# Patient Record
Sex: Female | Born: 1945 | Race: Black or African American | Hispanic: No | Marital: Married | Smoking: Never smoker
Health system: Southern US, Community
[De-identification: ages and names within clinical notes are randomized; demographics above are authoritative.]

## PROBLEM LIST (undated history)

## (undated) DIAGNOSIS — I38 Endocarditis, valve unspecified: Secondary | ICD-10-CM

## (undated) DIAGNOSIS — K439 Ventral hernia without obstruction or gangrene: Secondary | ICD-10-CM

## (undated) DIAGNOSIS — R0600 Dyspnea, unspecified: Secondary | ICD-10-CM

## (undated) DIAGNOSIS — K3184 Gastroparesis: Secondary | ICD-10-CM

## (undated) DIAGNOSIS — C50919 Malignant neoplasm of unspecified site of unspecified female breast: Secondary | ICD-10-CM

## (undated) DIAGNOSIS — Z9221 Personal history of antineoplastic chemotherapy: Secondary | ICD-10-CM

## (undated) DIAGNOSIS — I10 Essential (primary) hypertension: Secondary | ICD-10-CM

## (undated) DIAGNOSIS — R911 Solitary pulmonary nodule: Secondary | ICD-10-CM

## (undated) DIAGNOSIS — I700 Atherosclerosis of aorta: Secondary | ICD-10-CM

## (undated) DIAGNOSIS — Z8711 Personal history of peptic ulcer disease: Secondary | ICD-10-CM

## (undated) DIAGNOSIS — Z87442 Personal history of urinary calculi: Secondary | ICD-10-CM

## (undated) DIAGNOSIS — E785 Hyperlipidemia, unspecified: Secondary | ICD-10-CM

## (undated) DIAGNOSIS — R0602 Shortness of breath: Secondary | ICD-10-CM

## (undated) DIAGNOSIS — I2510 Atherosclerotic heart disease of native coronary artery without angina pectoris: Secondary | ICD-10-CM

## (undated) DIAGNOSIS — C801 Malignant (primary) neoplasm, unspecified: Secondary | ICD-10-CM

## (undated) DIAGNOSIS — K219 Gastro-esophageal reflux disease without esophagitis: Secondary | ICD-10-CM

## (undated) DIAGNOSIS — Z923 Personal history of irradiation: Secondary | ICD-10-CM

## (undated) DIAGNOSIS — I499 Cardiac arrhythmia, unspecified: Secondary | ICD-10-CM

## (undated) DIAGNOSIS — R0609 Other forms of dyspnea: Secondary | ICD-10-CM

## (undated) DIAGNOSIS — I5030 Unspecified diastolic (congestive) heart failure: Secondary | ICD-10-CM

## (undated) DIAGNOSIS — T4145XA Adverse effect of unspecified anesthetic, initial encounter: Secondary | ICD-10-CM

## (undated) DIAGNOSIS — T8859XA Other complications of anesthesia, initial encounter: Secondary | ICD-10-CM

## (undated) DIAGNOSIS — G4700 Insomnia, unspecified: Secondary | ICD-10-CM

## (undated) DIAGNOSIS — F419 Anxiety disorder, unspecified: Secondary | ICD-10-CM

## (undated) DIAGNOSIS — M47816 Spondylosis without myelopathy or radiculopathy, lumbar region: Secondary | ICD-10-CM

## (undated) DIAGNOSIS — Z87440 Personal history of urinary (tract) infections: Secondary | ICD-10-CM

## (undated) DIAGNOSIS — D649 Anemia, unspecified: Secondary | ICD-10-CM

## (undated) DIAGNOSIS — K648 Other hemorrhoids: Secondary | ICD-10-CM

## (undated) DIAGNOSIS — M51369 Other intervertebral disc degeneration, lumbar region without mention of lumbar back pain or lower extremity pain: Secondary | ICD-10-CM

## (undated) DIAGNOSIS — N281 Cyst of kidney, acquired: Secondary | ICD-10-CM

## (undated) DIAGNOSIS — K469 Unspecified abdominal hernia without obstruction or gangrene: Secondary | ICD-10-CM

## (undated) DIAGNOSIS — M1990 Unspecified osteoarthritis, unspecified site: Secondary | ICD-10-CM

## (undated) DIAGNOSIS — Z9889 Other specified postprocedural states: Secondary | ICD-10-CM

## (undated) DIAGNOSIS — K5790 Diverticulosis of intestine, part unspecified, without perforation or abscess without bleeding: Secondary | ICD-10-CM

## (undated) DIAGNOSIS — K760 Fatty (change of) liver, not elsewhere classified: Secondary | ICD-10-CM

## (undated) DIAGNOSIS — K589 Irritable bowel syndrome without diarrhea: Secondary | ICD-10-CM

## (undated) DIAGNOSIS — M5136 Other intervertebral disc degeneration, lumbar region: Secondary | ICD-10-CM

## (undated) DIAGNOSIS — R011 Cardiac murmur, unspecified: Secondary | ICD-10-CM

## (undated) DIAGNOSIS — E119 Type 2 diabetes mellitus without complications: Secondary | ICD-10-CM

## (undated) DIAGNOSIS — Q2112 Patent foramen ovale: Secondary | ICD-10-CM

## (undated) DIAGNOSIS — T7840XA Allergy, unspecified, initial encounter: Secondary | ICD-10-CM

## (undated) DIAGNOSIS — E669 Obesity, unspecified: Secondary | ICD-10-CM

## (undated) DIAGNOSIS — Z5189 Encounter for other specified aftercare: Secondary | ICD-10-CM

## (undated) DIAGNOSIS — N200 Calculus of kidney: Secondary | ICD-10-CM

## (undated) DIAGNOSIS — Z8719 Personal history of other diseases of the digestive system: Secondary | ICD-10-CM

## (undated) DIAGNOSIS — R112 Nausea with vomiting, unspecified: Secondary | ICD-10-CM

## (undated) HISTORY — DX: Malignant (primary) neoplasm, unspecified: C80.1

## (undated) HISTORY — PX: CATARACT EXTRACTION W/ INTRAOCULAR LENS  IMPLANT, BILATERAL: SHX1307

## (undated) HISTORY — DX: Type 2 diabetes mellitus without complications: E11.9

## (undated) HISTORY — DX: Insomnia, unspecified: G47.00

## (undated) HISTORY — DX: Atherosclerosis of aorta: I70.0

## (undated) HISTORY — DX: Obesity, unspecified: E66.9

## (undated) HISTORY — DX: Endocarditis, valve unspecified: I38

## (undated) HISTORY — DX: Allergy, unspecified, initial encounter: T78.40XA

## (undated) HISTORY — PX: BACK SURGERY: SHX140

## (undated) HISTORY — DX: Diverticulosis of intestine, part unspecified, without perforation or abscess without bleeding: K57.90

## (undated) HISTORY — PX: BREAST BIOPSY: SHX20

## (undated) HISTORY — DX: Gastro-esophageal reflux disease without esophagitis: K21.9

## (undated) HISTORY — DX: Solitary pulmonary nodule: R91.1

## (undated) HISTORY — DX: Spondylosis without myelopathy or radiculopathy, lumbar region: M47.816

## (undated) HISTORY — DX: Unspecified osteoarthritis, unspecified site: M19.90

## (undated) HISTORY — DX: Other hemorrhoids: K64.8

## (undated) HISTORY — DX: Patent foramen ovale: Q21.12

## (undated) HISTORY — DX: Malignant neoplasm of unspecified site of unspecified female breast: C50.919

## (undated) HISTORY — DX: Essential (primary) hypertension: I10

## (undated) HISTORY — DX: Atherosclerotic heart disease of native coronary artery without angina pectoris: I25.10

## (undated) HISTORY — DX: Other intervertebral disc degeneration, lumbar region without mention of lumbar back pain or lower extremity pain: M51.369

## (undated) HISTORY — DX: Irritable bowel syndrome, unspecified: K58.9

## (undated) HISTORY — DX: Hyperlipidemia, unspecified: E78.5

## (undated) HISTORY — PX: APPENDECTOMY: SHX54

## (undated) HISTORY — PX: LITHOTRIPSY: SUR834

## (undated) HISTORY — DX: Fatty (change of) liver, not elsewhere classified: K76.0

## (undated) HISTORY — DX: Ventral hernia without obstruction or gangrene: K43.9

## (undated) HISTORY — PX: OTHER SURGICAL HISTORY: SHX169

## (undated) HISTORY — DX: Gastroparesis: K31.84

## (undated) HISTORY — DX: Unspecified diastolic (congestive) heart failure: I50.30

## (undated) HISTORY — PX: EYE SURGERY: SHX253

## (undated) HISTORY — PX: BREAST SURGERY: SHX581

## (undated) HISTORY — PX: OVARIAN CYST SURGERY: SHX726

## (undated) HISTORY — DX: Other intervertebral disc degeneration, lumbar region: M51.36

---

## 1979-05-25 DIAGNOSIS — Z5189 Encounter for other specified aftercare: Secondary | ICD-10-CM

## 1979-05-25 DIAGNOSIS — IMO0001 Reserved for inherently not codable concepts without codable children: Secondary | ICD-10-CM

## 1979-05-25 HISTORY — DX: Encounter for other specified aftercare: Z51.89

## 1979-05-25 HISTORY — DX: Reserved for inherently not codable concepts without codable children: IMO0001

## 1987-09-24 DIAGNOSIS — E119 Type 2 diabetes mellitus without complications: Secondary | ICD-10-CM

## 1987-09-24 HISTORY — DX: Type 2 diabetes mellitus without complications: E11.9

## 1995-09-24 DIAGNOSIS — C50919 Malignant neoplasm of unspecified site of unspecified female breast: Secondary | ICD-10-CM

## 1995-09-24 HISTORY — PX: MASTECTOMY: SHX3

## 1995-09-24 HISTORY — DX: Malignant neoplasm of unspecified site of unspecified female breast: C50.919

## 1999-10-29 ENCOUNTER — Encounter: Admission: RE | Admit: 1999-10-29 | Discharge: 1999-12-07 | Payer: Self-pay | Admitting: Oncology

## 2000-09-23 HISTORY — PX: ABDOMINAL HYSTERECTOMY: SHX81

## 2001-01-23 ENCOUNTER — Ambulatory Visit (HOSPITAL_COMMUNITY): Admission: RE | Admit: 2001-01-23 | Discharge: 2001-01-23 | Payer: Self-pay | Admitting: General Surgery

## 2001-04-03 ENCOUNTER — Encounter: Admission: RE | Admit: 2001-04-03 | Discharge: 2001-04-03 | Payer: Self-pay | Admitting: Oncology

## 2001-04-03 ENCOUNTER — Encounter (HOSPITAL_COMMUNITY): Admission: RE | Admit: 2001-04-03 | Discharge: 2001-04-22 | Payer: Self-pay | Admitting: Oncology

## 2001-05-15 ENCOUNTER — Ambulatory Visit (HOSPITAL_COMMUNITY): Admission: RE | Admit: 2001-05-15 | Discharge: 2001-05-15 | Payer: Self-pay | Admitting: Cardiology

## 2001-07-13 ENCOUNTER — Encounter: Admission: RE | Admit: 2001-07-13 | Discharge: 2001-10-11 | Payer: Self-pay | Admitting: Internal Medicine

## 2002-01-11 ENCOUNTER — Encounter: Payer: Self-pay | Admitting: General Surgery

## 2002-01-11 ENCOUNTER — Ambulatory Visit (HOSPITAL_COMMUNITY): Admission: RE | Admit: 2002-01-11 | Discharge: 2002-01-11 | Payer: Self-pay | Admitting: General Surgery

## 2002-05-07 ENCOUNTER — Encounter: Admission: RE | Admit: 2002-05-07 | Discharge: 2002-05-07 | Payer: Self-pay | Admitting: Oncology

## 2002-05-07 ENCOUNTER — Encounter (HOSPITAL_COMMUNITY): Admission: RE | Admit: 2002-05-07 | Discharge: 2002-06-06 | Payer: Self-pay | Admitting: Oncology

## 2002-06-15 ENCOUNTER — Encounter: Admission: RE | Admit: 2002-06-15 | Discharge: 2002-09-13 | Payer: Self-pay | Admitting: Internal Medicine

## 2003-01-28 ENCOUNTER — Encounter: Payer: Self-pay | Admitting: General Surgery

## 2003-01-28 ENCOUNTER — Ambulatory Visit (HOSPITAL_COMMUNITY): Admission: RE | Admit: 2003-01-28 | Discharge: 2003-01-28 | Payer: Self-pay | Admitting: General Surgery

## 2003-05-20 ENCOUNTER — Encounter (HOSPITAL_COMMUNITY): Admission: RE | Admit: 2003-05-20 | Discharge: 2003-06-19 | Payer: Self-pay | Admitting: General Surgery

## 2003-05-20 ENCOUNTER — Encounter: Payer: Self-pay | Admitting: General Surgery

## 2003-05-24 ENCOUNTER — Encounter: Admission: RE | Admit: 2003-05-24 | Discharge: 2003-05-24 | Payer: Self-pay | Admitting: Oncology

## 2003-05-24 ENCOUNTER — Encounter (HOSPITAL_COMMUNITY): Admission: RE | Admit: 2003-05-24 | Discharge: 2003-06-23 | Payer: Self-pay | Admitting: Oncology

## 2003-05-27 ENCOUNTER — Encounter: Payer: Self-pay | Admitting: General Surgery

## 2003-06-10 ENCOUNTER — Ambulatory Visit (HOSPITAL_COMMUNITY): Admission: RE | Admit: 2003-06-10 | Discharge: 2003-06-10 | Payer: Self-pay | Admitting: General Surgery

## 2003-06-10 ENCOUNTER — Encounter: Payer: Self-pay | Admitting: General Surgery

## 2003-06-22 ENCOUNTER — Ambulatory Visit (HOSPITAL_COMMUNITY): Admission: RE | Admit: 2003-06-22 | Discharge: 2003-06-22 | Payer: Self-pay | Admitting: General Surgery

## 2003-07-07 ENCOUNTER — Emergency Department (HOSPITAL_COMMUNITY): Admission: EM | Admit: 2003-07-07 | Discharge: 2003-07-07 | Payer: Self-pay | Admitting: Emergency Medicine

## 2003-07-07 ENCOUNTER — Encounter: Payer: Self-pay | Admitting: Emergency Medicine

## 2003-07-11 ENCOUNTER — Encounter: Payer: Self-pay | Admitting: Urology

## 2003-07-11 ENCOUNTER — Ambulatory Visit (HOSPITAL_BASED_OUTPATIENT_CLINIC_OR_DEPARTMENT_OTHER): Admission: RE | Admit: 2003-07-11 | Discharge: 2003-07-11 | Payer: Self-pay | Admitting: Urology

## 2003-07-14 ENCOUNTER — Ambulatory Visit (HOSPITAL_COMMUNITY): Admission: RE | Admit: 2003-07-14 | Discharge: 2003-07-14 | Payer: Self-pay | Admitting: Urology

## 2004-04-10 ENCOUNTER — Encounter (HOSPITAL_COMMUNITY): Admission: RE | Admit: 2004-04-10 | Discharge: 2004-05-10 | Payer: Self-pay | Admitting: Oncology

## 2004-04-10 ENCOUNTER — Encounter: Admission: RE | Admit: 2004-04-10 | Discharge: 2004-04-10 | Payer: Self-pay | Admitting: Oncology

## 2004-05-23 ENCOUNTER — Encounter: Admission: RE | Admit: 2004-05-23 | Discharge: 2004-06-22 | Payer: Self-pay | Admitting: Oncology

## 2004-05-23 ENCOUNTER — Encounter (HOSPITAL_COMMUNITY): Admission: RE | Admit: 2004-05-23 | Discharge: 2004-06-22 | Payer: Self-pay | Admitting: Oncology

## 2005-01-18 ENCOUNTER — Encounter: Admission: RE | Admit: 2005-01-18 | Discharge: 2005-01-18 | Payer: Self-pay | Admitting: Gastroenterology

## 2005-03-08 ENCOUNTER — Encounter (INDEPENDENT_AMBULATORY_CARE_PROVIDER_SITE_OTHER): Payer: Self-pay | Admitting: Specialist

## 2005-03-08 ENCOUNTER — Ambulatory Visit (HOSPITAL_COMMUNITY): Admission: RE | Admit: 2005-03-08 | Discharge: 2005-03-08 | Payer: Self-pay | Admitting: Gastroenterology

## 2005-05-28 ENCOUNTER — Encounter (HOSPITAL_COMMUNITY): Admission: RE | Admit: 2005-05-28 | Discharge: 2005-06-21 | Payer: Self-pay | Admitting: Oncology

## 2005-05-28 ENCOUNTER — Encounter: Admission: RE | Admit: 2005-05-28 | Discharge: 2005-06-21 | Payer: Self-pay | Admitting: Oncology

## 2005-06-05 ENCOUNTER — Ambulatory Visit (HOSPITAL_COMMUNITY): Payer: Self-pay | Admitting: Oncology

## 2006-06-02 ENCOUNTER — Encounter (HOSPITAL_COMMUNITY): Admission: RE | Admit: 2006-06-02 | Discharge: 2006-06-20 | Payer: Self-pay | Admitting: Oncology

## 2006-06-02 ENCOUNTER — Encounter: Admission: RE | Admit: 2006-06-02 | Discharge: 2006-06-20 | Payer: Self-pay | Admitting: Oncology

## 2006-06-04 ENCOUNTER — Ambulatory Visit (HOSPITAL_COMMUNITY): Payer: Self-pay | Admitting: Oncology

## 2006-07-02 ENCOUNTER — Ambulatory Visit: Payer: Self-pay | Admitting: Family Medicine

## 2006-08-05 ENCOUNTER — Other Ambulatory Visit: Admission: RE | Admit: 2006-08-05 | Discharge: 2006-08-05 | Payer: Self-pay | Admitting: Family Medicine

## 2006-08-05 ENCOUNTER — Ambulatory Visit: Payer: Self-pay | Admitting: Family Medicine

## 2006-08-05 ENCOUNTER — Encounter (INDEPENDENT_AMBULATORY_CARE_PROVIDER_SITE_OTHER): Payer: Self-pay | Admitting: *Deleted

## 2006-08-05 ENCOUNTER — Encounter: Payer: Self-pay | Admitting: Family Medicine

## 2006-10-23 ENCOUNTER — Ambulatory Visit: Payer: Self-pay | Admitting: Family Medicine

## 2006-11-07 ENCOUNTER — Ambulatory Visit (HOSPITAL_COMMUNITY): Admission: RE | Admit: 2006-11-07 | Discharge: 2006-11-07 | Payer: Self-pay | Admitting: Family Medicine

## 2006-11-07 ENCOUNTER — Encounter: Payer: Self-pay | Admitting: Family Medicine

## 2006-11-07 LAB — CONVERTED CEMR LAB
Albumin: 4.2 g/dL (ref 3.5–5.2)
HDL: 64 mg/dL (ref >39)
Hgb A1c MFr Bld: 7.1 % — ABNORMAL HIGH (ref 4.6–6.1)
LDL Cholesterol: 104 mg/dL — ABNORMAL HIGH (ref 0–99)
Triglycerides: 95 mg/dL (ref <150)
VLDL: 19 mg/dL (ref 0–40)

## 2006-11-19 ENCOUNTER — Ambulatory Visit: Payer: Self-pay | Admitting: Family Medicine

## 2006-12-19 ENCOUNTER — Emergency Department (HOSPITAL_COMMUNITY): Admission: EM | Admit: 2006-12-19 | Discharge: 2006-12-19 | Payer: Self-pay | Admitting: Emergency Medicine

## 2006-12-19 ENCOUNTER — Ambulatory Visit: Payer: Self-pay | Admitting: Family Medicine

## 2006-12-24 ENCOUNTER — Ambulatory Visit: Payer: Self-pay | Admitting: Family Medicine

## 2007-03-18 ENCOUNTER — Ambulatory Visit: Payer: Self-pay | Admitting: Family Medicine

## 2007-03-19 ENCOUNTER — Encounter: Payer: Self-pay | Admitting: Family Medicine

## 2007-03-19 LAB — CONVERTED CEMR LAB
ALT: 22 units/L (ref 0–35)
Bilirubin, Direct: 0.1 mg/dL (ref 0.0–0.3)
CO2: 22 meq/L (ref 19–32)
Chloride: 105 meq/L (ref 96–112)
HDL: 56 mg/dL (ref >39)
LDL Cholesterol: 131 mg/dL — ABNORMAL HIGH (ref 0–99)
Potassium: 3.9 meq/L (ref 3.5–5.3)
Sodium: 140 meq/L (ref 135–145)
Total Bilirubin: 0.5 mg/dL (ref 0.3–1.2)
Total CHOL/HDL Ratio: 3.8
VLDL: 28 mg/dL (ref 0–40)

## 2007-03-20 ENCOUNTER — Ambulatory Visit (HOSPITAL_COMMUNITY): Admission: RE | Admit: 2007-03-20 | Discharge: 2007-03-20 | Payer: Self-pay | Admitting: Family Medicine

## 2007-09-24 ENCOUNTER — Encounter: Payer: Self-pay | Admitting: Family Medicine

## 2007-09-24 DIAGNOSIS — N200 Calculus of kidney: Secondary | ICD-10-CM

## 2007-09-24 HISTORY — DX: Calculus of kidney: N20.0

## 2007-10-09 ENCOUNTER — Encounter (HOSPITAL_COMMUNITY): Admission: RE | Admit: 2007-10-09 | Discharge: 2007-11-08 | Payer: Self-pay | Admitting: Oncology

## 2007-10-09 ENCOUNTER — Ambulatory Visit (HOSPITAL_COMMUNITY): Payer: Self-pay | Admitting: Oncology

## 2007-11-09 ENCOUNTER — Ambulatory Visit: Payer: Self-pay | Admitting: Family Medicine

## 2007-11-09 LAB — CONVERTED CEMR LAB: Microalb, Ur: 4.52 mg/dL — ABNORMAL HIGH (ref 0.00–1.89)

## 2007-12-15 ENCOUNTER — Encounter: Payer: Self-pay | Admitting: Family Medicine

## 2007-12-15 ENCOUNTER — Ambulatory Visit (HOSPITAL_COMMUNITY): Admission: RE | Admit: 2007-12-15 | Discharge: 2007-12-15 | Payer: Self-pay | Admitting: Family Medicine

## 2007-12-15 LAB — CONVERTED CEMR LAB
ALT: 21 units/L (ref 0–35)
BUN: 8 mg/dL (ref 6–23)
Bilirubin, Direct: 0.1 mg/dL (ref 0.0–0.3)
Calcium: 9.7 mg/dL (ref 8.4–10.5)
Cholesterol: 212 mg/dL — ABNORMAL HIGH (ref 0–200)
Glucose, Bld: 151 mg/dL — ABNORMAL HIGH (ref 70–99)
Indirect Bilirubin: 0.3 mg/dL (ref 0.0–0.9)
Potassium: 3.7 meq/L (ref 3.5–5.3)
VLDL: 20 mg/dL (ref 0–40)

## 2008-01-04 ENCOUNTER — Ambulatory Visit: Payer: Self-pay | Admitting: Family Medicine

## 2008-02-02 ENCOUNTER — Ambulatory Visit: Payer: Self-pay | Admitting: Family Medicine

## 2008-02-09 ENCOUNTER — Encounter (INDEPENDENT_AMBULATORY_CARE_PROVIDER_SITE_OTHER): Payer: Self-pay | Admitting: *Deleted

## 2008-02-09 DIAGNOSIS — I10 Essential (primary) hypertension: Secondary | ICD-10-CM | POA: Insufficient documentation

## 2008-02-09 DIAGNOSIS — E669 Obesity, unspecified: Secondary | ICD-10-CM

## 2008-02-09 DIAGNOSIS — G4700 Insomnia, unspecified: Secondary | ICD-10-CM

## 2008-02-09 DIAGNOSIS — E785 Hyperlipidemia, unspecified: Secondary | ICD-10-CM | POA: Insufficient documentation

## 2008-02-09 DIAGNOSIS — C50919 Malignant neoplasm of unspecified site of unspecified female breast: Secondary | ICD-10-CM

## 2008-04-21 ENCOUNTER — Ambulatory Visit (HOSPITAL_COMMUNITY): Admission: RE | Admit: 2008-04-21 | Discharge: 2008-04-21 | Payer: Self-pay | Admitting: Ophthalmology

## 2008-04-27 ENCOUNTER — Encounter: Payer: Self-pay | Admitting: Family Medicine

## 2008-05-05 ENCOUNTER — Ambulatory Visit: Payer: Self-pay | Admitting: Family Medicine

## 2008-05-05 LAB — CONVERTED CEMR LAB
Glucose, Bld: 123 mg/dL
Hgb A1c MFr Bld: 7.6 %

## 2008-06-21 ENCOUNTER — Ambulatory Visit (HOSPITAL_COMMUNITY): Admission: RE | Admit: 2008-06-21 | Discharge: 2008-06-21 | Payer: Self-pay | Admitting: Family Medicine

## 2008-06-21 ENCOUNTER — Ambulatory Visit: Payer: Self-pay | Admitting: Family Medicine

## 2008-06-21 DIAGNOSIS — K219 Gastro-esophageal reflux disease without esophagitis: Secondary | ICD-10-CM | POA: Insufficient documentation

## 2008-06-21 DIAGNOSIS — K589 Irritable bowel syndrome without diarrhea: Secondary | ICD-10-CM | POA: Insufficient documentation

## 2008-06-21 LAB — CONVERTED CEMR LAB
BUN: 6 mg/dL (ref 6–23)
CO2: 28 meq/L (ref 19–32)
Chloride: 101 meq/L (ref 96–112)
Glucose, Bld: 168 mg/dL — ABNORMAL HIGH (ref 70–99)
Lymphocytes Relative: 35 % (ref 12–46)
Lymphs Abs: 3.1 10*3/uL (ref 0.7–4.0)
MCV: 79.3 fL (ref 78.0–100.0)
Monocytes Relative: 7 % (ref 3–12)
Neutro Abs: 4.9 10*3/uL (ref 1.7–7.7)
Neutrophils Relative %: 54 % (ref 43–77)
Potassium: 4 meq/L (ref 3.5–5.3)
RBC: 4.73 M/uL (ref 3.87–5.11)
WBC: 9 10*3/uL (ref 4.0–10.5)

## 2008-06-24 ENCOUNTER — Encounter: Payer: Self-pay | Admitting: Family Medicine

## 2008-07-06 ENCOUNTER — Telehealth: Payer: Self-pay | Admitting: Family Medicine

## 2008-07-07 ENCOUNTER — Ambulatory Visit (HOSPITAL_COMMUNITY): Admission: RE | Admit: 2008-07-07 | Discharge: 2008-07-07 | Payer: Self-pay | Admitting: Ophthalmology

## 2008-07-14 ENCOUNTER — Other Ambulatory Visit: Admission: RE | Admit: 2008-07-14 | Discharge: 2008-07-14 | Payer: Self-pay | Admitting: Family Medicine

## 2008-07-14 ENCOUNTER — Encounter: Payer: Self-pay | Admitting: Family Medicine

## 2008-07-14 ENCOUNTER — Ambulatory Visit: Payer: Self-pay | Admitting: Family Medicine

## 2008-07-17 DIAGNOSIS — L909 Atrophic disorder of skin, unspecified: Secondary | ICD-10-CM | POA: Insufficient documentation

## 2008-07-17 DIAGNOSIS — L919 Hypertrophic disorder of the skin, unspecified: Secondary | ICD-10-CM

## 2008-07-18 ENCOUNTER — Encounter: Payer: Self-pay | Admitting: Family Medicine

## 2008-07-19 ENCOUNTER — Ambulatory Visit: Payer: Self-pay | Admitting: Family Medicine

## 2008-07-20 ENCOUNTER — Encounter: Payer: Self-pay | Admitting: Family Medicine

## 2008-07-21 LAB — CONVERTED CEMR LAB
Albumin: 4.2 g/dL (ref 3.5–5.2)
HDL: 57 mg/dL (ref >39)
LDL Cholesterol: 120 mg/dL — ABNORMAL HIGH (ref 0–99)
TSH: 0.754 microintl units/mL (ref 0.350–4.50)
Total Bilirubin: 0.6 mg/dL (ref 0.3–1.2)
Total CHOL/HDL Ratio: 3.7
Total Protein: 7.5 g/dL (ref 6.0–8.3)

## 2008-07-22 ENCOUNTER — Ambulatory Visit: Payer: Self-pay | Admitting: Internal Medicine

## 2008-07-26 ENCOUNTER — Telehealth (INDEPENDENT_AMBULATORY_CARE_PROVIDER_SITE_OTHER): Payer: Self-pay | Admitting: *Deleted

## 2008-07-28 ENCOUNTER — Encounter: Payer: Self-pay | Admitting: Family Medicine

## 2008-07-28 ENCOUNTER — Ambulatory Visit: Payer: Self-pay | Admitting: Gastroenterology

## 2008-08-01 ENCOUNTER — Encounter: Payer: Self-pay | Admitting: Family Medicine

## 2008-08-01 ENCOUNTER — Ambulatory Visit: Payer: Self-pay | Admitting: Internal Medicine

## 2008-08-04 ENCOUNTER — Encounter (HOSPITAL_COMMUNITY): Admission: RE | Admit: 2008-08-04 | Discharge: 2008-08-04 | Payer: Self-pay | Admitting: Gastroenterology

## 2008-08-08 ENCOUNTER — Ambulatory Visit: Payer: Self-pay | Admitting: Gastroenterology

## 2008-08-08 ENCOUNTER — Ambulatory Visit (HOSPITAL_COMMUNITY): Admission: RE | Admit: 2008-08-08 | Discharge: 2008-08-08 | Payer: Self-pay | Admitting: Gastroenterology

## 2008-08-08 ENCOUNTER — Encounter: Payer: Self-pay | Admitting: Family Medicine

## 2008-08-08 ENCOUNTER — Encounter: Payer: Self-pay | Admitting: Gastroenterology

## 2008-08-08 LAB — HM COLONOSCOPY

## 2008-08-10 ENCOUNTER — Encounter: Payer: Self-pay | Admitting: Internal Medicine

## 2008-08-10 ENCOUNTER — Ambulatory Visit: Payer: Self-pay

## 2008-08-11 ENCOUNTER — Ambulatory Visit: Payer: Self-pay | Admitting: Family Medicine

## 2008-08-11 LAB — CONVERTED CEMR LAB: Glucose, Bld: 189 mg/dL

## 2008-09-01 ENCOUNTER — Ambulatory Visit: Payer: Self-pay | Admitting: Internal Medicine

## 2008-09-02 ENCOUNTER — Ambulatory Visit: Payer: Self-pay

## 2008-09-02 ENCOUNTER — Encounter: Payer: Self-pay | Admitting: Family Medicine

## 2008-09-08 ENCOUNTER — Encounter: Payer: Self-pay | Admitting: Family Medicine

## 2008-10-07 ENCOUNTER — Ambulatory Visit (HOSPITAL_COMMUNITY): Payer: Self-pay | Admitting: Oncology

## 2008-11-21 HISTORY — PX: OTHER SURGICAL HISTORY: SHX169

## 2008-11-24 ENCOUNTER — Telehealth: Payer: Self-pay | Admitting: Family Medicine

## 2008-12-15 ENCOUNTER — Ambulatory Visit: Payer: Self-pay | Admitting: Gastroenterology

## 2008-12-15 DIAGNOSIS — K3184 Gastroparesis: Secondary | ICD-10-CM | POA: Insufficient documentation

## 2008-12-27 ENCOUNTER — Encounter: Payer: Self-pay | Admitting: Family Medicine

## 2008-12-28 ENCOUNTER — Encounter: Payer: Self-pay | Admitting: Family Medicine

## 2008-12-28 ENCOUNTER — Ambulatory Visit: Payer: Self-pay | Admitting: Family Medicine

## 2008-12-28 LAB — CONVERTED CEMR LAB: Glucose, Bld: 189 mg/dL

## 2008-12-30 ENCOUNTER — Ambulatory Visit (HOSPITAL_COMMUNITY): Admission: RE | Admit: 2008-12-30 | Discharge: 2008-12-30 | Payer: Self-pay | Admitting: Internal Medicine

## 2008-12-30 ENCOUNTER — Ambulatory Visit: Payer: Self-pay | Admitting: Internal Medicine

## 2008-12-30 DIAGNOSIS — E119 Type 2 diabetes mellitus without complications: Secondary | ICD-10-CM | POA: Insufficient documentation

## 2009-01-02 ENCOUNTER — Telehealth: Payer: Self-pay | Admitting: Family Medicine

## 2009-01-10 ENCOUNTER — Encounter: Payer: Self-pay | Admitting: Family Medicine

## 2009-01-11 LAB — CONVERTED CEMR LAB
AST: 31 units/L (ref 0–37)
Albumin: 3.7 g/dL (ref 3.5–5.2)
Alkaline Phosphatase: 62 units/L (ref 39–117)
BUN: 10 mg/dL (ref 6–23)
CO2: 28 meq/L (ref 19–32)
Glucose, Bld: 178 mg/dL — ABNORMAL HIGH (ref 70–99)
Potassium: 4.1 meq/L (ref 3.5–5.3)
Total Bilirubin: 0.7 mg/dL (ref 0.3–1.2)
Total Protein: 7 g/dL (ref 6.0–8.3)

## 2009-01-12 ENCOUNTER — Encounter: Payer: Self-pay | Admitting: Family Medicine

## 2009-01-16 LAB — CONVERTED CEMR LAB
Cholesterol: 205 mg/dL — ABNORMAL HIGH (ref 0–200)
Total CHOL/HDL Ratio: 3.7

## 2009-01-17 ENCOUNTER — Encounter: Admission: RE | Admit: 2009-01-17 | Discharge: 2009-01-17 | Payer: Self-pay | Admitting: Family Medicine

## 2009-02-14 ENCOUNTER — Telehealth (INDEPENDENT_AMBULATORY_CARE_PROVIDER_SITE_OTHER): Payer: Self-pay | Admitting: *Deleted

## 2009-03-06 ENCOUNTER — Encounter: Payer: Self-pay | Admitting: Family Medicine

## 2009-03-29 ENCOUNTER — Ambulatory Visit: Payer: Self-pay | Admitting: Family Medicine

## 2009-03-29 LAB — CONVERTED CEMR LAB
Blood Glucose, Fasting: 145 mg/dL
Hgb A1c MFr Bld: 7.2 %

## 2009-03-30 ENCOUNTER — Encounter: Payer: Self-pay | Admitting: Family Medicine

## 2009-03-31 ENCOUNTER — Encounter: Payer: Self-pay | Admitting: Family Medicine

## 2009-03-31 LAB — CONVERTED CEMR LAB
Creatinine, Urine: 308.6 mg/dL
Microalb Creat Ratio: 49.4 mg/g — ABNORMAL HIGH (ref 0.0–30.0)
Microalb, Ur: 15.25 mg/dL — ABNORMAL HIGH (ref 0.00–1.89)

## 2009-04-10 ENCOUNTER — Ambulatory Visit: Payer: Self-pay | Admitting: Internal Medicine

## 2009-04-10 DIAGNOSIS — R0609 Other forms of dyspnea: Secondary | ICD-10-CM | POA: Insufficient documentation

## 2009-04-10 DIAGNOSIS — R0602 Shortness of breath: Secondary | ICD-10-CM

## 2009-04-11 ENCOUNTER — Encounter: Payer: Self-pay | Admitting: Family Medicine

## 2009-04-11 ENCOUNTER — Telehealth (INDEPENDENT_AMBULATORY_CARE_PROVIDER_SITE_OTHER): Payer: Self-pay | Admitting: *Deleted

## 2009-04-14 ENCOUNTER — Ambulatory Visit (HOSPITAL_COMMUNITY): Admission: RE | Admit: 2009-04-14 | Discharge: 2009-04-14 | Payer: Self-pay | Admitting: Family Medicine

## 2009-06-30 ENCOUNTER — Encounter: Payer: Self-pay | Admitting: Family Medicine

## 2009-07-10 ENCOUNTER — Telehealth: Payer: Self-pay | Admitting: Family Medicine

## 2009-07-12 ENCOUNTER — Ambulatory Visit: Payer: Self-pay | Admitting: Family Medicine

## 2009-07-12 LAB — CONVERTED CEMR LAB
Glucose, Urine, Semiquant: NEGATIVE
Nitrite: NEGATIVE
Specific Gravity, Urine: 1.02
WBC Urine, dipstick: NEGATIVE
pH: 5.5

## 2009-07-14 ENCOUNTER — Telehealth: Payer: Self-pay | Admitting: Family Medicine

## 2009-07-14 ENCOUNTER — Encounter: Payer: Self-pay | Admitting: Family Medicine

## 2009-07-21 ENCOUNTER — Encounter: Payer: Self-pay | Admitting: Family Medicine

## 2009-07-27 ENCOUNTER — Ambulatory Visit: Payer: Self-pay | Admitting: Family Medicine

## 2009-07-27 DIAGNOSIS — N3000 Acute cystitis without hematuria: Secondary | ICD-10-CM | POA: Insufficient documentation

## 2009-07-27 LAB — CONVERTED CEMR LAB
Ketones, urine, test strip: NEGATIVE
Nitrite: NEGATIVE
Urobilinogen, UA: NEGATIVE
WBC Urine, dipstick: NEGATIVE
pH: 5.5

## 2009-08-14 ENCOUNTER — Telehealth: Payer: Self-pay | Admitting: Family Medicine

## 2009-08-28 ENCOUNTER — Encounter: Payer: Self-pay | Admitting: Family Medicine

## 2009-09-23 HISTORY — PX: SPINE SURGERY: SHX786

## 2009-09-29 ENCOUNTER — Encounter (INDEPENDENT_AMBULATORY_CARE_PROVIDER_SITE_OTHER): Payer: Self-pay | Admitting: *Deleted

## 2009-10-06 ENCOUNTER — Encounter: Payer: Self-pay | Admitting: Family Medicine

## 2009-10-06 ENCOUNTER — Ambulatory Visit (HOSPITAL_COMMUNITY): Payer: Self-pay | Admitting: Oncology

## 2009-10-09 ENCOUNTER — Encounter: Payer: Self-pay | Admitting: Gastroenterology

## 2009-11-04 ENCOUNTER — Encounter (INDEPENDENT_AMBULATORY_CARE_PROVIDER_SITE_OTHER): Payer: Self-pay | Admitting: *Deleted

## 2009-12-04 ENCOUNTER — Ambulatory Visit: Payer: Self-pay | Admitting: Internal Medicine

## 2009-12-26 ENCOUNTER — Ambulatory Visit: Payer: Self-pay | Admitting: Family Medicine

## 2009-12-26 DIAGNOSIS — R5382 Chronic fatigue, unspecified: Secondary | ICD-10-CM

## 2009-12-26 DIAGNOSIS — R1084 Generalized abdominal pain: Secondary | ICD-10-CM

## 2009-12-27 DIAGNOSIS — B009 Herpesviral infection, unspecified: Secondary | ICD-10-CM | POA: Insufficient documentation

## 2010-01-02 ENCOUNTER — Encounter: Payer: Self-pay | Admitting: Family Medicine

## 2010-01-05 LAB — CONVERTED CEMR LAB
ALT: 29 units/L (ref 0–35)
AST: 27 units/L (ref 0–37)
BUN: 7 mg/dL (ref 6–23)
Basophils Absolute: 0.1 10*3/uL (ref 0.0–0.1)
Basophils Relative: 1 % (ref 0–1)
Bilirubin, Direct: 0.2 mg/dL (ref 0.0–0.3)
Calcium: 9.6 mg/dL (ref 8.4–10.5)
Cholesterol: 215 mg/dL — ABNORMAL HIGH (ref 0–200)
Eosinophils Relative: 1 % (ref 0–5)
Glucose, Bld: 137 mg/dL — ABNORMAL HIGH (ref 70–99)
Hemoglobin: 11.7 g/dL — ABNORMAL LOW (ref 12.0–15.0)
Indirect Bilirubin: 0.5 mg/dL (ref 0.0–0.9)
Lymphocytes Relative: 38 % (ref 12–46)
MCHC: 32 g/dL (ref 30.0–36.0)
Monocytes Absolute: 0.6 10*3/uL (ref 0.1–1.0)
Neutro Abs: 4.5 10*3/uL (ref 1.7–7.7)
Platelets: 242 10*3/uL (ref 150–400)
RDW: 16 % — ABNORMAL HIGH (ref 11.5–15.5)
Sodium: 140 meq/L (ref 135–145)
Total Protein: 7.4 g/dL (ref 6.0–8.3)
Triglycerides: 156 mg/dL — ABNORMAL HIGH (ref <150)

## 2010-02-08 ENCOUNTER — Encounter: Payer: Self-pay | Admitting: Family Medicine

## 2010-02-23 ENCOUNTER — Encounter: Payer: Self-pay | Admitting: Family Medicine

## 2010-03-01 ENCOUNTER — Encounter: Payer: Self-pay | Admitting: Family Medicine

## 2010-03-27 ENCOUNTER — Ambulatory Visit: Payer: Self-pay | Admitting: Family Medicine

## 2010-03-29 ENCOUNTER — Ambulatory Visit (HOSPITAL_COMMUNITY): Admission: RE | Admit: 2010-03-29 | Discharge: 2010-03-29 | Payer: Self-pay | Admitting: Family Medicine

## 2010-05-03 ENCOUNTER — Encounter: Payer: Self-pay | Admitting: Family Medicine

## 2010-06-12 ENCOUNTER — Encounter: Payer: Self-pay | Admitting: Family Medicine

## 2010-07-23 ENCOUNTER — Ambulatory Visit: Payer: Self-pay | Admitting: Family Medicine

## 2010-07-23 LAB — HM DIABETES FOOT EXAM

## 2010-08-08 ENCOUNTER — Telehealth (INDEPENDENT_AMBULATORY_CARE_PROVIDER_SITE_OTHER): Payer: Self-pay | Admitting: *Deleted

## 2010-08-13 LAB — CONVERTED CEMR LAB
Albumin: 4.3 g/dL (ref 3.5–5.2)
BUN: 8 mg/dL (ref 6–23)
Basophils Relative: 0 % (ref 0–1)
CO2: 27 meq/L (ref 19–32)
Chloride: 101 meq/L (ref 96–112)
Creatinine, Urine: 191.9 mg/dL
Hemoglobin: 12.4 g/dL (ref 12.0–15.0)
Indirect Bilirubin: 0.4 mg/dL (ref 0.0–0.9)
LDL Cholesterol: 134 mg/dL — ABNORMAL HIGH (ref 0–99)
Lymphocytes Relative: 38 % (ref 12–46)
Lymphs Abs: 3.1 10*3/uL (ref 0.7–4.0)
Microalb Creat Ratio: 19.5 mg/g (ref 0.0–30.0)
Microalb, Ur: 3.75 mg/dL — ABNORMAL HIGH (ref 0.00–1.89)
Monocytes Relative: 7 % (ref 3–12)
Neutro Abs: 4.5 10*3/uL (ref 1.7–7.7)
Neutrophils Relative %: 55 % (ref 43–77)
Potassium: 4 meq/L (ref 3.5–5.3)
RBC: 4.88 M/uL (ref 3.87–5.11)
Total Bilirubin: 0.5 mg/dL (ref 0.3–1.2)
Total Protein: 7.5 g/dL (ref 6.0–8.3)
Triglycerides: 133 mg/dL (ref <150)
VLDL: 27 mg/dL (ref 0–40)
WBC: 8.2 10*3/uL (ref 4.0–10.5)

## 2010-08-22 ENCOUNTER — Ambulatory Visit: Payer: Self-pay | Admitting: Family Medicine

## 2010-08-23 HISTORY — PX: LUMBAR FUSION: SHX111

## 2010-08-30 ENCOUNTER — Encounter (INDEPENDENT_AMBULATORY_CARE_PROVIDER_SITE_OTHER): Payer: Self-pay | Admitting: Orthopedic Surgery

## 2010-08-30 ENCOUNTER — Observation Stay (HOSPITAL_COMMUNITY)
Admission: RE | Admit: 2010-08-30 | Discharge: 2010-09-03 | Payer: Self-pay | Source: Home / Self Care | Attending: Orthopedic Surgery | Admitting: Orthopedic Surgery

## 2010-10-05 ENCOUNTER — Ambulatory Visit (HOSPITAL_COMMUNITY): Admit: 2010-10-05 | Payer: Self-pay | Admitting: Oncology

## 2010-10-14 ENCOUNTER — Encounter: Payer: Self-pay | Admitting: Family Medicine

## 2010-10-23 NOTE — Letter (Signed)
Summary: labs  labs   Imported By: Rudene Anda 02/02/2010 10:43:19  _____________________________________________________________________  External Attachment:    Type:   Image     Comment:   External Document

## 2010-10-23 NOTE — Assessment & Plan Note (Signed)
Summary: OV   Vital Signs:  Patient profile:   65 year old female Menstrual status:  hysterectomy Height:      68 inches Weight:      208 pounds BMI:     31.74 O2 Sat:      98 % Pulse rate:   74 / minute Pulse rhythm:   regular Resp:     16 per minute BP sitting:   134 / 84  (left arm) Cuff size:   large  Vitals Entered By: Everitt Amber LPN (December 26, 9145 1:10 PM)  Nutrition Counseling: Patient's BMI is greater than 25 and therefore counseled on weight management options. CC: Follow up chronic problems   Primary Care Provider:  Syliva Overman M.D.   CC:  Follow up chronic problems.  History of Present Illness: pt's major complaint is of upper abdominal pain , and burning, uncontrolled reflux which she states is worsening , has to elevate head of bed and if  she bends over contents come out of her nostrils.  She reports alot of diarreah, has often messed herself at night, this is going on for yrs, has been told she has IBS and feels as though it is not being addressed. she is requesting redferral to a tertiary center for evaluation of this. Her brother had esophageal cancer, he was a non smoker and she is very worried about this. She has not been testing her blood sugars but states it is "alright". She has had dose [place] in her BP meds since her last visit and this is finally at goal. She states she has been advised by her card that i should truy to work through her lipid management with her since shereports statin intolerance. She retired this year and feels better    Current Medications (verified): 1)  Glucophage 1000 Mg  Tabs (Metformin Hcl) .... Take One By Mouth Twice Daily 2)  D 1000 Plus  Tabs (Fa-Cyanocobalamin-B6-D-Ca) .Marland Kitchen.. 1 Once Daily 3)  Geritol Complete  Tabs (Iron-Vitamins) .... One Tab By Mouth Qd 4)  Bystolic 10 Mg Tabs (Nebivolol Hcl) .... 2 Tabs Every Am 1 Tab Every Pm 5)  Aspirin 81 Mg Tbec (Aspirin) .... Take One Tablet By Mouth Daily  Allergies  (verified): 1)  ! Ace Inhibitors 2)  ! * Low Tolerance To Anesthesia and Statin  Past History:  Past medical, surgical, family and social histories (including risk factors) reviewed for relevance to current acute and chronic problems.  Past Medical History: Reviewed history from 07/22/2008 and no changes required. 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 INSOMNIA (ICD-780.52) GENERALIZED ANXIETY DISORDER (ICD-300.02) HYPERLIPIDEMIA (ICD-272.4) OBESITY (ICD-278.00) HYPERTENSION (ICD-401.9) DIABETES MELLITUS, TYPE I (ICD-250.01)  Past Surgical History: Reviewed history from 12/28/2008 and no changes required. Right mastectomy (1997) Caesarean section x3 Urologic surgery for blocked ureter secondary to kidney stone TAH and BSO (1995) Appendectomy (1971) CATARACT SURGERY RIGHT EYE , April 21, 2008, Bilateral eye surgery 03,2010 Implant and screws put in the right lower jaw 11/2008  Family History: Reviewed history from 02/09/2008 and no changes required. Mother died of heart attack Father died hunting accident Sisters 3 living Brother 1 deceased tp esophageal cancer  Social History: Reviewed history from 12/15/2008 and no changes required. retired 2011 Married Three children one died at 47  Never Smoked Alcohol use-no Drug use-no  Review of Systems General:  Denies chills and fever. Eyes:  Denies blurring and discharge. ENT:  Denies hoarseness, nasal congestion, and sinus  pressure. CV:  Denies chest pain or discomfort and palpitations. Resp:  Denies cough and sputum productive. GI:  See HPI. GU:  Denies dysuria and urinary frequency. MS:  Denies joint pain and stiffness. Derm:  Complains of itching and lesion(s); fever blister on right lower lip x 2 days. Neuro:  Denies headaches, poor balance, and seizures. Psych:  Denies  anxiety and depression. Endo:  Denies excessive thirst and excessive urination. Heme:  Denies abnormal bruising and bleeding. Allergy:  Denies hives or rash and itching eyes.  Physical Exam  General:  Well-developed,well-nourished,in no acute distress; alert,appropriate and cooperative throughout examination HEENT: No facial asymmetry,  EOMI, No sinus tenderness, TM's Clear, oropharynx  pink and moist.   Chest: Clear to auscultation bilaterally.  CVS: S1, S2, No murmurs, No S3.   OZH:YQMVHQION and non tender, no organomegaly or nmasses appreciated, however exam ios extremely difficult MS: Adequate ROM spine, hips, shoulders and knees.  Ext: No edema.   CNS: CN 2-12 intact, power tone and sensation normal throughout.   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  Memory intact, not anxious or depressed appearing.    Impression & Recommendations:  Problem # 1:  GERD, SEVERE (ICD-530.81) Assessment Deteriorated  Her updated medication list for this problem includes:    Omeprazole 40 Mg Cpdr (Omeprazole) .Marland Kitchen... Take 1 capsule by mouth once a day  Orders: Gastroenterology Referral (GI), uncontrolled symptoms which are worsening, pt has been off of aciphex for several months, pos fam h/o esophageal ca in a non smoker  Problem # 2:  IBS (ICD-564.1) Assessment: Unchanged  Orders: Gastroenterology Referral (GI)uncontrolled and disabling diarreah  Problem # 3:  DIABETES MELLITUS, TYPE II (ICD-250.00) Assessment: Comment Only  Her updated medication list for this problem includes:    Glucophage 1000 Mg Tabs (Metformin hcl) .Marland Kitchen... Take one by mouth twice daily    Aspirin 81 Mg Tbec (Aspirin) .Marland Kitchen... Take one tablet by mouth daily  Orders: T- Hemoglobin A1C ([id number]), pt encouraged to test daily and meter provided, stated she broke the last one  Labs Reviewed: Creat: 0.89 (01/10/2009)    Reviewed HgBA1c results: 6.7 (08/07/2009)   7.2 (03/29/2009)  Problem # 4:  HYPERLIPIDEMIA (ICD-272.4) Assessment: Comment Only  Orders: T-Hepatic Function [id number]) T-Lipid Profile [id number])  Labs Reviewed: SGOT: 31 (01/10/2009)   SGPT: 31 (01/10/2009)   HDL:55 (01/10/2009), 57 (07/20/2008)  LDL:118 (01/10/2009), 120 (07/20/2008)  Chol:205 (01/10/2009), 211 (07/20/2008)  Trig:162 (01/10/2009), 168 (07/20/2008), statin intolerance will review more recent labs  Problem # 5:  HYPERTENSION (ICD-401.9) Assessment: Improved  Her updated medication list for this problem includes:    Bystolic 10 Mg Tabs (Nebivolol hcl) .Marland Kitchen... 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 Take one by mouth twice daily 2)  D 1000 Plus Tabs (Fa-cyanocobalamin-b6-d-ca) .Marland Kitchen.. 1 once daily 3)  Geritol Complete Tabs (Iron-vitamins) .... One tab by mouth qd 4)  Bystolic 10 Mg Tabs (Nebivolol hcl) .... 2 tabs every am 1 tab every pm 5)  Aspirin 81 Mg Tbec (Aspirin) .... Take  one tablet by mouth daily 6)  Acyclovir 800 Mg Tabs (Acyclovir) .... One tablet five times daily 7)  Omeprazole 40 Mg Cpdr (Omeprazole) .... Take 1 capsule by mouth once a day 8)  One Touch Lancets and Strips  .... For once daily testing  Other Orders: T-CBC w/Diff [id number]) T-TSH [id number]) T-Vitamin D (25-Hydroxy) [id number])  Patient Instructions: 1)  Please schedule a follow-up appointment in 3 months. 2)  BMP prior to visit, ICD-9: 3)  Hepatic Panel prior to visit, ICD-9: 4)  Lipid Panel prior to visit, ICD-9: 5)  TSH prior to visit, ICD-9:    fasting as oon as possible 6)  CBC w/ Diff prior to visit, ICD-9: 7)  HbgA1C prior to visit, ICD-9: 8)  Vit d 9)  It is important that you exercise regularly at least 20 minutes 5 times a week. If you develop chest pain, have severe difficulty breathing, or feel very tired , stop exercising immediately and seek medical attention. 10)  You will be referred to a gI specialist abouyt your stomach problems 11)  You need to lose weight. Consider a lower calorie diet and regular exercise.  12)  Check your blood sugars regularly. If your readings are usually above 250 or below 70 you should contact our office. Prescriptions: ONE TOUCH LANCETS AND STRIPS For once daily testing  #53month x 4   Entered by:   Everitt Amber LPN   Authorized by:   Syliva Overman MD   Signed by:   Everitt Amber LPN on 02/72/5366   Method used:   Printed then faxed to ...       [facility] (retail)       [address]       [place], [place]  [address]       Ph: [phone]       Fax: [phone]   RxID:   [id number] OMEPRAZOLE 40 MG CPDR (OMEPRAZOLE) Take 1 capsule by mouth once a day  #30 x 3   Entered and Authorized by:   Syliva Overman MD   Signed by:   Syliva Overman MD on 12/26/2009   Method used:   Electronically to        [facility] (retail)       [address] [place], [place]  [address]       Ph: [phone]       Fax: [phone]   RxID:   [id number] ACYCLOVIR 800 MG TABS (ACYCLOVIR) one tablet five times daily  #50 x 0   Entered and Authorized by:   Syliva Overman MD   Signed by:   Syliva Overman MD on 12/26/2009   Method used:   Electronically to        [facility] (retail)       [address]       [place], [place]  [address]       Ph: [phone]       Fax: [phone]   RxID:   [id number]

## 2010-10-23 NOTE — Assessment & Plan Note (Signed)
Summary: office visit   Vital Signs:  Patient profile:   65 year old female Menstrual status:  hysterectomy Height:      68 inches Weight:      205.50 pounds BMI:     31.36 O2 Sat:      98 % on Room air Pulse rate:   80 / minute Pulse rhythm:   regular Resp:     16 per minute BP sitting:   126 / 70  (left arm)  Vitals Entered By: Adella Hare LPN (March 27, 1609 1:12 PM)  O2 Flow:  Room air CC: follow-up visit Is Patient Diabetic? Yes Did you bring your meter with you today? No Pain Assessment Patient in pain? no        Primary Care Provider:  Syliva Overman M.D.   CC:  follow-up visit.  History of Present Illness: Reports  that she has been  doing well. Denies recent fever or chills. Denies sinus pressure, nasal congestion , ear pain or sore throat. Denies chest congestion, or cough productive of sputum. Denies chest pain, palpitations, PND, orthopnea or leg swelling. Denies nausea, vomitting, or constipation. Denies change in bowel movements or bloody stool. Denies dysuria , frequency, incontinence or hesitancy. Denies  joint pain, swelling, or reduced mobility. Denies headaches, vertigo, seizures. Denies depression, anxiety or insomnia. Denies  rash, lesions, or itch. [facility] due to iBS, she is still not satisfied with the dx and mx despite seeing several previous specialists. She has not been as diligent with exercise as planned, but statyes that her blood sugars are under 130 when tested most of the time.     Current Medications (verified): 1)  Glucophage 1000 Mg  Tabs (Metformin Hcl) .... Take One By Mouth Twice Daily 2)  D 1000 Plus  Tabs (Fa-Cyanocobalamin-B6-D-Ca) .Marland Kitchen.. 1 Once Daily 3)  Geritol Complete  Tabs (Iron-Vitamins) .... One Tab By Mouth Qd 4)  Bystolic 10 Mg Tabs (Nebivolol Hcl) .... 2 Tabs Every Am 1 Tab Every Pm 5)  Aspirin 81 Mg Tbec (Aspirin) ....  Take One Tablet By Mouth Daily 6)  One Touch Lancets and Strips .... For Once Daily Testing 7)  Lansoprazole 30 Mg Cpdr (Lansoprazole) .... One Cap By Mouth Once Daily  Allergies (verified): 1)  ! Ace Inhibitors 2)  ! * Low Tolerance To Anesthesia and Statin  Review of Systems      See HPI Eyes:  Denies blurring, discharge, eye pain, and red eye. GI:  Complains of abdominal pain and diarrhea; chronic cramping abd pain with diarreah predominant IBS. Endo:  Denies cold intolerance, excessive hunger, excessive thirst, excessive urination, heat intolerance, polyuria, and weight change. Heme:  Denies abnormal bruising and bleeding. Allergy:  Denies hives or rash and itching eyes.  Physical Exam  General:  Well-developed,well-nourished,in no acute distress; alert,appropriate and cooperative throughout examination HEENT: No facial asymmetry,  EOMI, No sinus tenderness, TM's Clear, oropharynx  pink and moist.   Chest: Clear to auscultation bilaterally.  CVS: S1, S2, No murmurs, No S3.   RUE:[id number] and non tender, no organomegaly or nmasses appreciated, however exam ios extremely difficult MS: Adequate ROM spine, hips, shoulders and knees.  Ext: No edema.   CNS: CN 2-12 intact, power tone and sensation normal throughout.   Skin: Intact, Psych: Good eye contact, normal affect.  Memory intact, not anxious or depressed appearing.   Diabetes Management Exam:    Foot Exam (with  socks and/or shoes not present):       Sensory-Monofilament:          Left foot: diminished          Right foot: diminished       Inspection:          Left foot: normal          Right foot: normal       Nails:          Left foot: normal          Right foot: normal   Impression & Recommendations:  Problem # 1:  ABDOMINAL PAIN, GENERALIZED (ICD-789.07) Assessment Improved  Problem # 2:  IBS (ICD-564.1) Assessment: Unchanged  Problem # 3:  HYPERLIPIDEMIA (ICD-272.4) Assessment: Deteriorated  The  following medications were removed from the medication list:    Lovastatin 40 Mg Tabs (Lovastatin) ..... One tab by mouth at bedtime  Orders: T-Lipid Profile [id number])  Labs Reviewed: SGOT: 27 (12/27/2009)   SGPT: 29 (12/27/2009)   HDL:55 (12/27/2009), 55 (01/10/2009)  LDL:129 (12/27/2009), 118 (01/10/2009)  Chol:215 (12/27/2009), 205 (01/10/2009)  Trig:156 (12/27/2009), 162 (01/10/2009)  Problem # 4:  OBESITY (ICD-278.00) Assessment: Improved  Ht: 68 (03/27/2010)   Wt: 205.50 (03/27/2010)   BMI: 31.36 (03/27/2010)  Problem # 5:  HYPERTENSION (ICD-401.9) Assessment: Improved  Her updated medication list for this problem includes:    Bystolic 10 Mg Tabs (Nebivolol hcl) .Marland Kitchen... 2 tabs every am 1 tab every pm  Orders: T-Basic Metabolic Panel [id number])  BP today: 126/70 Prior BP: 134/84 (12/26/2009)  Labs Reviewed: K+: 4.1 (12/27/2009) Creat: : 0.83 (12/27/2009)   Chol: 215 (12/27/2009)   HDL: 55 (12/27/2009)   LDL: 129 (12/27/2009)   TG: 156 (12/27/2009)  Problem # 6:  DIABETES MELLITUS, TYPE II (ICD-250.00) Assessment: Comment Only  The following medications were removed from the medication list:    Januvia 100 Mg Tabs (Sitagliptin phosphate) ..... One tab by mouth once daily Her updated medication list for this problem includes:    Glucophage 1000 Mg Tabs (Metformin hcl) .Marland Kitchen... Take one by mouth twice daily    Aspirin 81 Mg Tbec (Aspirin) .Marland Kitchen... Take one tablet by mouth daily  Orders: T- Hemoglobin A1C ([id number]) T-Urine Microalbumin w/creat. ratio [id number]) Radiology Referral (Radiology)  Labs Reviewed: Creat: 0.83 (12/27/2009)    Reviewed HgBA1c results: 7.5 (12/27/2009)  6.7 (08/07/2009)  Complete Medication List: 1)  Glucophage 1000 Mg Tabs (Metformin hcl) .... Take one by mouth twice daily 2)  D 1000 Plus Tabs (Fa-cyanocobalamin-b6-d-ca) .Marland Kitchen.. 1 once daily 3)  Geritol Complete Tabs (Iron-vitamins) .... One tab by mouth qd 4)  Bystolic 10 Mg  Tabs (Nebivolol hcl) .... 2 tabs every am 1 tab every pm 5)  Aspirin 81 Mg Tbec (Aspirin) .... Take one tablet by mouth daily 6)  One Touch Lancets and Strips  .... For once daily testing 7)  Lansoprazole 30 Mg Cpdr (Lansoprazole) .... One cap by mouth once daily  Patient Instructions: 1)  Please schedule a follow-up appointment in 3 months. 2)  It is important that you exercise regularly at least 20 minutes 5 times a week. If you develop chest pain, have severe difficulty breathing, or feel very tired , stop exercising immediately and seek medical attention. 3)  You need to lose weight. Consider a lower calorie diet and regular exercise. congrats on your new lifestyl , walking and increased exercise and losing weight  4)  HbgA1C prior to visit, ICD-9:  today 5)  fasting  lipid, chem 7 and microalb in 3 months 6)  Mamogrm is past due we will schedule and call with appt Prescriptions: GLUCOPHAGE 1000 MG  TABS (METFORMIN HCL) take one by mouth twice daily  #60 Each x 3   Entered by:   Adella Hare LPN   Authorized by:   Syliva Overman MD   Signed by:   Adella Hare LPN on 16/06/9603   Method used:   Electronically to        [facility] (retail)       [address] [place], [place]  [address]       Ph: [phone]       Fax: [phone]   RxID:   [id number]

## 2010-10-23 NOTE — Op Note (Signed)
Summary: EGD  EGD   Imported By: Lind Guest 02/28/2010 10:10:18  _____________________________________________________________________  External Attachment:    Type:   Image     Comment:   External Document

## 2010-10-23 NOTE — Assessment & Plan Note (Signed)
Summary: F UP   Vital Signs:  Patient profile:   65 year old female Menstrual status:  hysterectomy Height:      68 inches Weight:      208.75 pounds BMI:     31.86 O2 Sat:      98 % on Room air Pulse rate:   79 / minute Pulse rhythm:   regular Resp:     16 per minute BP sitting:   160 / 90  (left arm)  Vitals Entered By: Mauricia Area CMA (July 23, 2010 2:30 PM)  Nutrition Counseling: Patient's BMI is greater than 25 and therefore counseled on weight management options.  O2 Flow:  Room air CC: Back pain   Primary Care Aarsh Fristoe:  Syliva Overman M.D.   CC:  Back pain.  History of Present Illness: Pt with uncontrolled back pain and lower ext pain rdiating down her left thigh, she had an  mri OF HER BACK in oct which shows nerve compression, surgery is recommended, because of uncontrolled pain and left thigh weakness and numbness, she is considering surgery aafter Thanksgiving. she denies any recent fever or chills. She states her blood sugars have been doing fairly well when she does test, which is still irregularly. she denies head or chest congestion, sore throat or nasal drainage. She denies abdominal pain, nausea, vomitting or diarreah.,  Current Medications (verified): 1)  Glucophage 1000 Mg  Tabs (Metformin Hcl) .... Take One By Mouth Twice Daily 2)  D 1000 Plus  Tabs (Fa-Cyanocobalamin-B6-D-Ca) .Marland Kitchen.. 1 Once Daily 3)  Geritol Complete  Tabs (Iron-Vitamins) .... One Tab By Mouth Once Daily. 4)  Bystolic 10 Mg Tabs (Nebivolol Hcl) .... 2 Tabs Every Am 1 Tab Every Pm 5)  Aspirin 81 Mg Tbec (Aspirin) .... Take One Tablet By Mouth Daily 6)  One Touch Lancets and Strips .... For Once Daily Testing 7)  Lansoprazole 30 Mg Cpdr (Lansoprazole) .... One Cap By Mouth Once Daily 8)  Ketorolac Tromethamine 10 Mg Tabs (Ketorolac Tromethamine) .Marland Kitchen.. 1 Tab Every 6 Hours As Needed  Allergies (verified): 1)  ! Ace Inhibitors 2)  ! 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 Eyes:  Denies blurring and discharge. ENT:  Denies hoarseness, nasal congestion, postnasal drainage, sinus pressure, and sore throat. CV:  Denies chest pain or discomfort, palpitations, and swelling of feet. Resp:  Denies cough and sputum productive. GI:  Denies abdominal pain, constipation, nausea, and vomiting. GU:  Denies dysuria and urinary frequency. MS:  Complains of low back pain, mid back pain, and muscle weakness. Derm:  Denies itching and rash. Neuro:  Complains of weakness; denies memory loss. Psych:  Denies anxiety and depression. Endo:  Denies excessive thirst and excessive urination. Heme:  Denies abnormal bruising and bleeding. Allergy:  Denies hives or rash and itching eyes.  Physical Exam  General:  Well-developed,well-nourished,in no acute distress; alert,appropriate and cooperative throughout examination HEENT: No facial asymmetry,  EOMI, No sinus tenderness, TM's Clear, oropharynx  pink and moist.   Chest: Clear to auscultation bilaterally.  CVS: S1, S2, No murmurs, No S3.   Abd: Soft, Nontender.  MS: decreased OM spine,adequate  in  hips, shoulders and knees.  Ext: No edema.   CNS: CN 2-12 intact,reduced  power tone and sensation in right  lower extremity.  Skin: Intact, no visible lesions or rashes.  Psych: Good eye contact, normal affect.  Memory intact, not anxious or depressed appearing.   Diabetes Management Exam:    Foot Exam (with socks and/or shoes not  present):       Sensory-Monofilament:          Left foot: normal          Right foot: diminished       Inspection:          Left foot: normal          Right foot: normal       Nails:          Left foot: normal          Right foot: normal   Impression & Recommendations:  Problem # 1:  IBS (ICD-564.1) Assessment Unchanged pt has ahd multiple GI assesments with the sAME DX, SHE IS NOW SATISFIED THAT THIS IS HER PATHOLOGY, SHE IS DIARREAH PREDIONMINANT  Problem # 2:  HYPERLIPIDEMIA (ICD-272.4) Assessment: Comment Only  Orders: T-Lipid Profile ([id number]) T-Hepatic Function [phone]) Low fat dietdiscussed and encouraged Pt is statin intolerant Labs Reviewed: SGOT: 27 (12/27/2009)   SGPT: 29 (12/27/2009)   HDL:55 (12/27/2009), 55 (01/10/2009)  LDL:129 (12/27/2009), 118 (01/10/2009)  Chol:215 (12/27/2009), 205 (01/10/2009)  Trig:156 (12/27/2009), 162 (01/10/2009)  Problem # 3:  HYPERTENSION (ICD-401.9) Assessment: Deteriorated  The following medications were removed from the medication list:    Bystolic 10 Mg Tabs (Nebivolol hcl) .Marland Kitchen... 2 tabs every am 1 tab every pm Her updated medication list for this problem includes:    Bystolic 10 Mg Tabs (Nebivolol hcl) .Marland Kitchen..Marland Kitchen Two tablets in the morning and two in the evening  Orders: T-Basic Metabolic Panel [id number])  BP today: 160/90 Prior BP: 126/70 (03/27/2010)  Labs Reviewed: K+: 4.1 (12/27/2009) Creat: : 0.83 (12/27/2009)   Chol: 215 (12/27/2009)   HDL: 55 (12/27/2009)   LDL: 129 (12/27/2009)   TG: 156 (12/27/2009)  Problem # 4:  DIABETES MELLITUS, TYPE II (ICD-250.00) Assessment: Comment Only  Her updated medication list for this problem includes:    Glucophage 1000 Mg Tabs (Metformin hcl) .Marland Kitchen... Take one by mouth twice daily    Aspirin 81 Mg Tbec (Aspirin) .Marland Kitchen... Take one tablet by mouth daily  Orders: T- Hemoglobin A1C ([id number]) T-Urine Microalbumin w/creat. ratio ([phone]) Patient advised to reduce  carbs and sweets, commit to regular physical activity, take meds as prescribed, test blood sugars as directed, and attempt to lose weight , to improve blood sugar control.  Labs Reviewed: Creat: 0.83 (12/27/2009)    Reviewed HgBA1c results: 6.8 (03/27/2010)  7.5 (12/27/2009)  Complete Medication List: 1)  Glucophage 1000 Mg Tabs (Metformin hcl) .... Take one by mouth twice daily 2)  D 1000 Plus Tabs (Fa-cyanocobalamin-b6-d-ca) .Marland Kitchen.. 1 once daily 3)  Geritol Complete Tabs (Iron-vitamins) .... One tab by mouth once daily. 4)  Aspirin 81 Mg Tbec (Aspirin) .... Take one tablet by mouth daily 5)  One Touch Lancets and Strips  .... For once daily testing 6)  Lansoprazole 30 Mg Cpdr (Lansoprazole) .... One cap by mouth once daily 7)  Ketorolac Tromethamine 10 Mg Tabs (Ketorolac tromethamine) .Marland Kitchen.. 1 tab every 6 hours as needed 8)  Bystolic 10 Mg Tabs (Nebivolol hcl) .... Two tablets in the morning and two in the evening  Other Orders:  T-CBC w/Diff ([id number])  Patient Instructions: 1)  Please schedule a follow-up appointment in 1 month. 2)  You need to lose weight. Consider a lower calorie diet and regular exercise.  3)  Check your blood sugars regularly. If your readings are usually above : or below 70 you should contact our office. 4)  BMP prior to visit, ICD-9: 5)  Hepatic Panel prior to visit, ICD-9: 6)  Lipid Panel prior to visit, ICD-9:  fasting today 7)  CBC w/ Diff prior to visit, ICD-9: 8)  HbgA1C prior to visit, ICD-9: 9)  You need the flu vac when you get back 10)  dose inc on bystolic Prescriptions: GLUCOPHAGE 1000 MG  TABS (METFORMIN HCL) take one by mouth twice daily  #60 Each x 3   Entered by:   Adella Hare LPN   Authorized by:   Syliva Overman MD   Signed by:   Adella Hare LPN on 30/86/5784   Method used:   Electronically to        [facility] (retail)       [address] [place], [place]  [address]       Ph:  [phone]       Fax: [phone]   RxID:   [id number] BYSTOLIC 10 MG TABS (NEBIVOLOL HCL) two tablets in the morning and two in the evening  #120 x 3   Entered and Authorized by:   Syliva Overman MD   Signed by:   Syliva Overman MD on 07/23/2010   Method used:   Printed then faxed to ...       [facility] (retail)       [address]       [place], [place]  [address]       Ph: [phone]       Fax: [phone]   RxID:   [id number]    Orders Added: 1)  Est. Patient Level IV [37628] 2)  T-Basic Metabolic Panel [id number] 3)  T-Lipid Profile [80061-22930] 4)  T-Hepatic Function [80076-22960] 5)  T-CBC w/Diff [[id number]] 6)  T- Hemoglobin A1C [83036-23375] 7)  T-Urine Microalbumin w/creat. ratio [[id number]]

## 2010-10-23 NOTE — Procedures (Signed)
Summary: EGD  EGD   Imported By: Lind Guest 03/01/2010 10:01:37  _____________________________________________________________________  External Attachment:    Type:   Image     Comment:   External Document

## 2010-10-23 NOTE — Assessment & Plan Note (Signed)
Summary: 6-8 month rov/sl   Visit Type:  Follow-up Referring Provider:  Dr. Syliva Overman Primary Provider:  Syliva Overman M.D.   CC:  sob and stomach problems.  History of Present Illness: Patient is a 65 year old with a hisotry of SOB.  I last saw her in July of last year.   SInce seen she says her breathing has been a little better.  Her biggest complaint is Gi with reflux and frequent loose bowel movemnts. She denies chest pain.    Current Medications (verified): 1)  Glucophage 1000 Mg  Tabs (Metformin Hcl) .... Take One By Mouth Twice Daily 2)  D 1000 Plus  Tabs (Fa-Cyanocobalamin-B6-D-Ca) .Marland Kitchen.. 1 Once Daily 3)  Geritol Complete  Tabs (Iron-Vitamins) .... One Tab By Mouth Qd 4)  Bystolic 10 Mg Tabs (Nebivolol Hcl) .... Take 1 Tablet 2 Times Per Day 5)  Aspirin 81 Mg Tbec (Aspirin) .... Take One Tablet By Mouth Daily  Allergies (verified): 1)  ! Ace Inhibitors 2)  ! 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 INSOMNIA (ICD-780.52) GENERALIZED ANXIETY DISORDER (ICD-300.02) HYPERLIPIDEMIA (ICD-272.4) OBESITY (ICD-278.00) HYPERTENSION (ICD-401.9) DIABETES MELLITUS, TYPE I (ICD-250.01)  Social History: Last updated: 12/15/2008 employed Married Three children one died at 68  Never Smoked Alcohol use-no Drug use-no  Vital Signs:  Patient profile:   65 year old female Menstrual status:  hysterectomy Height:      68 inches Weight:      207 pounds BMI:     31.59 Pulse rate:   78 / minute BP sitting:   147 / 82  (left arm) Cuff size:   regular  Vitals Entered By: Burnett Kanaris, CNA (December 04, 2009 3:09 PM)  Physical Exam  Additional Exam:  Obese 65 year old in NAD HEENT:  Normocephalic, atraumatic. EOMI, PERRLA.  Neck:  JVP is normal. No thyromegaly. No bruits.  Lungs: clear to auscultation. No rales no wheezes.  Heart: Regular rate and rhythm. Normal S1, S2. No S3.   No significant murmurs. PMI not displaced.  Abdomen:  Supple, nontender. Normal bowel sounds. No masses. No hepatomegaly.  Extremities:   Good distal pulses throughout. No lower extremity edema.  Musculoskeletal :moving all extremities.  Neuro:   alert and oriented x3.    EKG  Procedure date:  12/04/2009  Findings:      NSR.  71 bpm.  T wave inversion II, III, AVCF, V4-V6. (unchanged from 2009)  Impression & Recommendations:  Problem # 1:  DYSPNEA (ICD-786.05) BP is a little on high side.  I have used the bystolic for BP and HR control (as treadmill in past showed rapid increase in HR) I would increase bystolic to 20 AM, 10 PM.   Her updated medication list for this problem includes:    Bystolic 10 Mg Tabs (Nebivolol hcl) .Marland Kitchen... 2 tabs every am 1 tab every pm    Aspirin 81 Mg Tbec (Aspirin) .Marland Kitchen... Take one tablet by mouth daily  Problem # 2:  IBS (ICD-564.1) Patient needs GI follow up.  She will call.  Problem # 3:  HYPERLIPIDEMIA (ICD-272.4) Per her report didn't tolerated Crestor (myalgias)  I will review others with Dr.  Lodema Hong.  Wiht hx of diabetes needs to be on a statin.  Other Orders: EKG w/ Interpretation (93000)  Patient Instructions: 1)  Your physician recommends that you schedule a follow-up appointment in:  6 mon 2)  Your physician has recommended you make the following change in your medication: bystolic 10 mg 2 tabs every am 1 tab every pm Prescriptions: BYSTOLIC 10 MG TABS (NEBIVOLOL HCL) 2 TABS EVERY AM 1 TAB EVERY PM  #90 x 6   Entered by:   Scherrie Bateman, LPN   Authorized by:   Sherrill Raring, MD, [facility]   Signed by:   Scherrie Bateman, LPN on 62/95/2841   Method used:   Electronically to        [facility] (retail)       [address]       [place], [place]   [address]       Ph: [phone]       Fax: [phone]   RxID:   [id number]

## 2010-10-23 NOTE — Letter (Signed)
Summary: misc  misc   Imported By: Rudene Anda 02/02/2010 10:46:40  _____________________________________________________________________  External Attachment:    Type:   Image     Comment:   External Document

## 2010-10-23 NOTE — Letter (Signed)
Summary: Appointment - Reminder 2  [facility], Main Office  [address]   [place], [place] [address]   Phone: [phone]  Fax: [phone]     September 29, 2009 MRN: [id number]   [facility] [address] [place], [place]  [address]   Dear Ms. Lodema Hong,  Our records indicate that it is time to schedule a follow-up appointment with Dr. Tenny Craw. It is very important that we reach you to schedule this appointment. We look forward to participating in your health care needs. Please contact us at the number listed above at your earliest convenience to schedule your appointment.  If you are unable to make an appointment at this time, give [place] a call so we can update our records.  Sincerely,   Migdalia Dk [facility] Scheduling Team

## 2010-10-23 NOTE — Procedures (Signed)
Summary: ENDOSCOPY  ENDOSCOPY   Imported By: Lind Guest 03/01/2010 09:59:51  _____________________________________________________________________  External Attachment:    Type:   Image     Comment:   External Document

## 2010-10-23 NOTE — Letter (Signed)
Summary: conslutation  conslutation   Imported By: Rudene Anda 02/02/2010 10:28:22  _____________________________________________________________________  External Attachment:    Type:   Image     Comment:   External Document

## 2010-10-23 NOTE — Letter (Signed)
Summary: labs  labs   Imported By: Rudene Anda 02/02/2010 10:24:17  _____________________________________________________________________  External Attachment:    Type:   Image     Comment:   External Document

## 2010-10-23 NOTE — Progress Notes (Signed)
  Cardiac records faxed to [name] @ [phone] [facility]  August 08, 2010 3:02 PM

## 2010-10-23 NOTE — Letter (Signed)
Summary: phone notes  phone notes   Imported By: Rudene Anda 02/02/2010 10:45:15  _____________________________________________________________________  External Attachment:    Type:   Image     Comment:   External Document

## 2010-10-23 NOTE — Letter (Signed)
Summary: office notes  office notes   Imported By: Rudene Anda 02/02/2010 10:41:30  _____________________________________________________________________  External Attachment:    Type:   Image     Comment:   External Document

## 2010-10-23 NOTE — Progress Notes (Signed)
Summary: [facility]  [facility]   Imported By: Lind Guest 10/27/2009 09:56:52  _____________________________________________________________________  External Attachment:    Type:   Image     Comment:   External Document

## 2010-10-23 NOTE — Letter (Signed)
Summary: TRANSFERRED RECORDS  TRANSFERRED RECORDS   Imported By: Lind Guest 06/20/2010 16:38:42  _____________________________________________________________________  External Attachment:    Type:   Image     Comment:   External Document

## 2010-10-23 NOTE — Medication Information (Signed)
Summary: Tax adviser   Imported By: Diana Eves 10/09/2009 09:14:07  _____________________________________________________________________  External Attachment:    Type:   Image     Comment:   External Document  Appended Document: RX Folder - aciphex    Prescriptions: ACIPHEX 20 MG TBEC (RABEPRAZOLE SODIUM) Take one 30-60 min before first and last meals of the day  #60 x 11   Entered and Authorized by:   Leanna Battles. Dixon Boos   Signed by:   Leanna Battles Dixon Boos on 10/09/2009   Method used:   Electronically to        [facility] (retail)       [address]       [place], [place]  [address]       Ph: [phone]       Fax: [phone]   RxID:   [id number]     Appended Document: RX Folder Pt was NS for past 2-3 appts. No med refills unless pt is seen. OPV in 2 mos, RE: gastroparesis, E: 30 visit.  Appended Document: RX Folder lmom to call back

## 2010-10-23 NOTE — Procedures (Signed)
Summary: Gastroenterology  Gastroenterology   Imported By: Lind Guest 02/28/2010 09:19:36  _____________________________________________________________________  External Attachment:    Type:   Image     Comment:   External Document  Appended Document: Gastroenterology Pt seen at [facility]. No visit with [facility] in > 1 year. She will need refills from her PCP or [facility].  Appended Document: Gastroenterology Pt informed.

## 2010-10-23 NOTE — Letter (Signed)
Summary: demo  demo   Imported By: Rudene Anda 02/02/2010 10:36:58  _____________________________________________________________________  External Attachment:    Type:   Image     Comment:   External Document

## 2010-10-23 NOTE — Progress Notes (Signed)
Summary: BREATHING TEST  BREATHING TEST   Imported By: Lind Guest 05/04/2010 15:47:19  _____________________________________________________________________  External Attachment:    Type:   Image     Comment:   External Document

## 2010-10-23 NOTE — Letter (Signed)
Summary: Letter  Letter   Imported By: Lind Guest 01/04/2010 14:30:03  _____________________________________________________________________  External Attachment:    Type:   Image     Comment:   External Document

## 2010-10-23 NOTE — Procedures (Signed)
Summary: Gastroenterology  Gastroenterology   Imported By: Lind Guest 03/09/2010 07:48:51  _____________________________________________________________________  External Attachment:    Type:   Image     Comment:   External Document

## 2010-10-23 NOTE — Op Note (Signed)
Summary: SIGMOIDOSCOPY  SIGMOIDOSCOPY   Imported By: Lind Guest 02/28/2010 10:09:49  _____________________________________________________________________  External Attachment:    Type:   Image     Comment:   External Document

## 2010-10-23 NOTE — Letter (Signed)
Summary: phone notes  phone notes   Imported By: Rudene Anda 02/02/2010 10:29:40  _____________________________________________________________________  External Attachment:    Type:   Image     Comment:   External Document

## 2010-10-23 NOTE — Letter (Signed)
Summary: misc  misc   Imported By: Rudene Anda 02/02/2010 10:34:32  _____________________________________________________________________  External Attachment:    Type:   Image     Comment:   External Document

## 2010-10-23 NOTE — Letter (Signed)
Summary: office notes  office notes   Imported By: Rudene Anda 02/02/2010 10:22:27  _____________________________________________________________________  External Attachment:    Type:   Image     Comment:   External Document

## 2010-10-23 NOTE — Procedures (Signed)
Summary: Gastroenterology  Gastroenterology   Imported By: Lind Guest 03/09/2010 07:49:23  _____________________________________________________________________  External Attachment:    Type:   Image     Comment:   External Document

## 2010-10-23 NOTE — Letter (Signed)
Summary: 1st Missed Appt.  [facility]  [address]   [place], [place] [address]   Phone: [phone]  Fax: [phone]    November 04, 2009  MRN: [id number]  [facility] [address] [place], [place]  [address]  Dear Ms. Lodema Hong,  At [facility], we make every attempt to fit patients into our schedule by reserving several appointment slots for same-day appointments.  However, we cannot always make appointments for patients the same day they are calling.  At the end of the day, we look back at our schedule and find that because of last-minute cancellations and patients not showing up for their scheduled appointments, we have several appointment slots that are left open and could have been used by another person who really needed it.  In the past, you may have been one of the patients who could not get in when you needed to.  But recently, you were one of the patients with an appointment that you didn't show up for or canceled too late for [place] to fill it.  We choose not to charge no-show or last minute cancellation fees to our patients, like many other offices do.  We do not wish to institute that policy and hope we never have to.  However, we kindly request that you assist [place] by providing at least 24 hours' notice if you can't make your appointment.  If no-shows or late cancellations become habitual (i.e. Three or more in a one-year period), we may terminate the physician-patient relationship.    Thank you for your consideration and cooperation.   Altamease Oiler

## 2010-10-23 NOTE — Letter (Signed)
Summary: history and physical  history and physical   Imported By: Rudene Anda 02/02/2010 10:21:48  _____________________________________________________________________  External Attachment:    Type:   Image     Comment:   External Document

## 2010-10-23 NOTE — Assessment & Plan Note (Signed)
Summary: F UP   Vital Signs:  Patient profile:   65 year old female Menstrual status:  hysterectomy Height:      68 inches Weight:      204.25 pounds BMI:     31.17 O2 Sat:      98 % on Room air Pulse rate:   72 / minute Pulse rhythm:   regular Resp:     16 per minute BP sitting:   140 / 82  (left arm)  Vitals Entered By: Adella Hare LPN (August 22, 2010 1:52 PM)  Nutrition Counseling: Patient's BMI is greater than 25 and therefore counseled on weight management options.  O2 Flow:  Room air CC: follow-up visit Is Patient Diabetic? Yes Did you bring your meter with you today? No Comments did not bring meds to ov   Primary Care Provider:  Syliva Overman M.D.   CC:  follow-up visit.  History of Present Illness: Reports  that she has been doig well, she has upcoming back surgery , but wants to clear the need for same before she proceeds further. Denies recent fever or chills. Denies sinus pressure, nasal congestion , ear pain or sore throat. Denies chest congestion, or cough productive of sputum. Denies chest pain, palpitations, PND, orthopnea or leg swelling. Denies abdominal pain, nausea, vomitting, diarrhea or constipation. Denies change in bowel movements or bloody stool. Denies dysuria , frequency, incontinence or hesitancy. she c/o uncontrolled back pain , impairig mobility and comfort, has upcomig surgery planned, and is here for re-eval of BP Denies headaches, vertigo, seizures. Denies depression, anxiety or insomnia. Denies  rash, lesions, or itch.     Allergies (verified): 1)  ! Ace Inhibitors 2)  ! * Low Tolerance To Anesthesia and Statin 3)  ! * Statin  Review of Systems      See HPI General:  Complains of fatigue and sleep disorder. Eyes:  Denies discharge, eye pain, and red eye. MS:  Complains of low back pain and mid back pain. Endo:  Denies cold intolerance, excessive hunger, excessive thirst, and excessive urination. Heme:  Denies abnormal  bruising and bleeding. Allergy:  Denies hives or rash, itching eyes, and seasonal allergies.  Physical Exam  General:  Well-developed,well-nourished,in no acute distress; alert,appropriate and cooperative throughout examination HEENT: No facial asymmetry,  EOMI, No sinus tenderness, TM's Clear, oropharynx  pink and moist.   Chest: Clear to auscultation bilaterally.  CVS: S1, S2, No murmurs, No S3.   Abd: Soft, Nontender.  MS: decreased  ROM spine,adequate in  hips, shoulders and knees.  Ext: No edema.   CNS: CN 2-12 intact, power tone and sensation normal throughout.   Skin: Intact, no visible lesions or rashes.  Psych: Good eye contact, normal affect.  Memory intact, not anxious or depressed appearing.    Impression & Recommendations:  Problem # 1:  GERD, SEVERE (ICD-530.81) Assessment Improved  Her updated medication list for this problem includes:    Lansoprazole 30 Mg Cpdr (Lansoprazole) ..... One cap by mouth once daily  Problem # 2:  IBS (ICD-564.1) Assessment: Unchanged  Problem # 3:  HYPERTENSION (ICD-401.9) Assessment: Improved  Her updated medication list for this problem includes:    Bystolic 10 Mg Tabs (Nebivolol hcl) .Marland Kitchen..Marland Kitchen Two tablets in the morning and two in the evening    Clonidine Hcl 0.1 Mg Tabs (Clonidine hcl) ..... Half tablet at midnight  Orders: T-Basic Metabolic Panel [id number])  BP today: 140/82 Prior BP: 160/90 (07/23/2010)  Labs Reviewed: K+: 4.0 (08/02/2010)  Creat: : 0.84 (08/02/2010)   Chol: 224 (08/02/2010)   HDL: 63 (08/02/2010)   LDL: 134 (08/02/2010)   TG: 133 (08/02/2010)  Problem # 4:  HYPERLIPIDEMIA (ICD-272.4) Assessment: Unchanged  Orders: T-Lipid Profile ([id number]) T-Hepatic Function [phone]) Low fat dietdiscussed and encouraged  Labs Reviewed: SGOT: 32 (08/02/2010)   SGPT: 31 (08/02/2010)   HDL:63 (08/02/2010), 55 (12/27/2009)  LDL:134 (08/02/2010), 129 (12/27/2009)  Chol:224 (08/02/2010), 215 (12/27/2009)   Trig:133 (08/02/2010), 156 (12/27/2009)  Problem # 5:  DIABETES MELLITUS, TYPE II (ICD-250.00) Assessment: Improved  Her updated medication list for this problem includes:    Glucophage 1000 Mg Tabs (Metformin hcl) .Marland Kitchen... Take one by mouth twice daily    Aspirin 81 Mg Tbec (Aspirin) .Marland Kitchen... Take one tablet by mouth daily  Orders: T- Hemoglobin A1C ([id number])  Labs Reviewed: Creat: 0.84 (08/02/2010)    Reviewed HgBA1c results: 6.6 (08/02/2010)  6.8 (03/27/2010)  Complete Medication List: 1)  Glucophage 1000 Mg Tabs (Metformin hcl) .... Take one by mouth twice daily 2)  D 1000 Plus Tabs (Fa-cyanocobalamin-b6-d-ca) .Marland Kitchen.. 1 once daily 3)  Geritol Complete Tabs (Iron-vitamins) .... One tab by mouth once daily. 4)  Aspirin 81 Mg Tbec (Aspirin) .... Take one tablet by mouth daily 5)  One Touch Lancets and Strips  .... For once daily testing 6)  Lansoprazole 30 Mg Cpdr (Lansoprazole) .... One cap by mouth once daily 7)  Ketorolac Tromethamine 10 Mg Tabs (Ketorolac tromethamine) .Marland Kitchen.. 1 tab every 6 hours as needed 8)  Bystolic 10 Mg Tabs (Nebivolol hcl) .... Two tablets in the morning and two in the evening 9)  Clonidine Hcl 0.1 Mg Tabs (Clonidine hcl) .... Half tablet at midnight  Patient Instructions: 1)  F/U visit end February orearly March. 2)  BMP prior to visit, ICD-9: 3)  Hepatic Panel prior to visit, ICD-9: 4)  Lipid Panel prior to visit, ICD-9: 5)  HbgA1C prior to visit, ICD-9:  fasting in end Feb/early March 6)  New med for BP pls continue the bystolic. 7)  BP140/80 Prescriptions: CLONIDINE HCL 0.1 MG TABS (CLONIDINE HCL) half tablet at midnight  #30 x 2   Entered and Authorized by:   Syliva Overman MD   Signed by:   Syliva Overman MD on 08/22/2010   Method used:   Electronically to        [facility] (retail)       [address]       [place], [place]  [address]       Ph: [phone]       Fax: [phone]   RxID:    [id number]    Orders Added: 1)  Est. Patient Level IV [87564] 2)  T-Basic Metabolic Panel [80048-22910] 3)  T-Lipid Profile [80061-22930] 4)  T-Hepatic Function [80076-22960] 5)  T- Hemoglobin A1C [83036-23375]

## 2010-10-23 NOTE — Letter (Signed)
Summary: histroy and physical  histroy and physical   Imported By: Rudene Anda 02/02/2010 10:39:20  _____________________________________________________________________  External Attachment:    Type:   Image     Comment:   External Document

## 2010-10-23 NOTE — Letter (Signed)
Summary: [facility]   Imported By: Lind Guest 07/24/2010 10:40:48  _____________________________________________________________________  External Attachment:    Type:   Image     Comment:   External Document

## 2010-10-23 NOTE — Letter (Signed)
Summary: demo  demo   Imported By: Rudene Anda 02/02/2010 10:14:06  _____________________________________________________________________  External Attachment:    Type:   Image     Comment:   External Document

## 2010-10-23 NOTE — Medication Information (Signed)
Summary: Tax adviser   Imported By: Diana Eves 10/09/2009 09:04:23  _____________________________________________________________________  External Attachment:    Type:   Image     Comment:   External Document  Appended Document: RX Folder duplicate

## 2010-10-23 NOTE — Letter (Signed)
Summary: xrays  xrays   Imported By: Rudene Anda 02/02/2010 10:25:49  _____________________________________________________________________  External Attachment:    Type:   Image     Comment:   External Document

## 2010-11-26 ENCOUNTER — Encounter: Payer: Self-pay | Admitting: Family Medicine

## 2010-11-27 ENCOUNTER — Encounter: Payer: Self-pay | Admitting: Family Medicine

## 2010-11-27 ENCOUNTER — Ambulatory Visit (INDEPENDENT_AMBULATORY_CARE_PROVIDER_SITE_OTHER): Payer: 59 | Admitting: Family Medicine

## 2010-11-27 ENCOUNTER — Other Ambulatory Visit: Payer: Self-pay | Admitting: Family Medicine

## 2010-11-27 DIAGNOSIS — E785 Hyperlipidemia, unspecified: Secondary | ICD-10-CM

## 2010-11-27 DIAGNOSIS — I10 Essential (primary) hypertension: Secondary | ICD-10-CM

## 2010-11-27 DIAGNOSIS — E119 Type 2 diabetes mellitus without complications: Secondary | ICD-10-CM

## 2010-11-27 DIAGNOSIS — E669 Obesity, unspecified: Secondary | ICD-10-CM

## 2010-11-28 ENCOUNTER — Encounter: Payer: Self-pay | Admitting: Family Medicine

## 2010-11-28 LAB — HEMOGLOBIN A1C
Hgb A1c MFr Bld: 6.6 % — ABNORMAL HIGH (ref <5.7)
Mean Plasma Glucose: 143 mg/dL — ABNORMAL HIGH (ref <117)

## 2010-11-28 LAB — BASIC METABOLIC PANEL
CO2: 26 mEq/L (ref 19–32)
Calcium: 10.4 mg/dL (ref 8.4–10.5)
Creat: 0.77 mg/dL (ref 0.40–1.20)
Sodium: 137 mEq/L (ref 135–145)

## 2010-11-28 LAB — CONVERTED CEMR LAB
Chloride: 102 meq/L (ref 96–112)
Potassium: 4.2 meq/L (ref 3.5–5.3)

## 2010-12-04 LAB — GLUCOSE, CAPILLARY
Glucose-Capillary: 104 mg/dL — ABNORMAL HIGH (ref 70–99)
Glucose-Capillary: 107 mg/dL — ABNORMAL HIGH (ref 70–99)
Glucose-Capillary: 123 mg/dL — ABNORMAL HIGH (ref 70–99)
Glucose-Capillary: 124 mg/dL — ABNORMAL HIGH (ref 70–99)
Glucose-Capillary: 156 mg/dL — ABNORMAL HIGH (ref 70–99)
Glucose-Capillary: 165 mg/dL — ABNORMAL HIGH (ref 70–99)
Glucose-Capillary: 194 mg/dL — ABNORMAL HIGH (ref 70–99)
Glucose-Capillary: 208 mg/dL — ABNORMAL HIGH (ref 70–99)

## 2010-12-04 LAB — HEMOGLOBIN AND HEMATOCRIT, BLOOD
HCT: 32.1 % — ABNORMAL LOW (ref 36.0–46.0)
Hemoglobin: 10.2 g/dL — ABNORMAL LOW (ref 12.0–15.0)

## 2010-12-04 LAB — CBC
Hemoglobin: 11.5 g/dL — ABNORMAL LOW (ref 12.0–15.0)
MCV: 78 fL (ref 78.0–100.0)
Platelets: 218 10*3/uL (ref 150–400)
RBC: 4.41 MIL/uL (ref 3.87–5.11)
WBC: 8.9 10*3/uL (ref 4.0–10.5)

## 2010-12-04 LAB — COMPREHENSIVE METABOLIC PANEL
Alkaline Phosphatase: 47 U/L (ref 39–117)
BUN: 6 mg/dL (ref 6–23)
Creatinine, Ser: 0.71 mg/dL (ref 0.4–1.2)
Glucose, Bld: 118 mg/dL — ABNORMAL HIGH (ref 70–99)
Potassium: 4 mEq/L (ref 3.5–5.1)
Total Bilirubin: 0.5 mg/dL (ref 0.3–1.2)
Total Protein: 7.2 g/dL (ref 6.0–8.3)

## 2010-12-04 LAB — SURGICAL PCR SCREEN
MRSA, PCR: NEGATIVE
Staphylococcus aureus: NEGATIVE

## 2010-12-04 NOTE — Letter (Signed)
Summary: medical release  medical release   Imported By: Lind Guest 11/28/2010 11:28:25  _____________________________________________________________________  External Attachment:    Type:   Image     Comment:   External Document

## 2010-12-04 NOTE — Letter (Signed)
Summary: Letter  Letter   Imported By: Lind Guest 11/28/2010 16:01:19  _____________________________________________________________________  External Attachment:    Type:   Image     Comment:   External Document

## 2010-12-04 NOTE — Letter (Signed)
Summary: diabetic shoes  diabetic shoes   Imported By: Lind Guest 11/28/2010 11:27:15  _____________________________________________________________________  External Attachment:    Type:   Image     Comment:   External Document

## 2010-12-11 NOTE — Assessment & Plan Note (Signed)
Summary: follow up   Vital Signs:  Patient profile:   65 year old female Menstrual status:  hysterectomy Height:      68 inches Weight:      201.75 pounds BMI:     30.79 O2 Sat:      97 % Pulse rate:   82 / minute Pulse rhythm:   regular Resp:     16 per minute BP sitting:   128 / 84  (left arm) Cuff size:   large  Vitals Entered By: Everitt Amber LPN (November 26, 1608 1:09 PM)  Nutrition Counseling: Patient's BMI is greater than 25 and therefore counseled on weight management options. CC: Follow up chronic problems   Primary Care Provider:  Syliva Overman M.D.   CC:  Follow up chronic problems.  History of Present Illness: Pt is driving independently for the first time since her back surgery in Dec, sore on the inside , healed on the outside.She , had 5 day hospital stay.  she is to start therapy next week Reports  that she is generally doing well. Denies recent fever or chills. Denies sinus pressure, nasal congestion , ear pain or sore throat. Denies chest congestion, or cough productive of sputum. Denies chest pain, palpitations, PND, orthopnea or leg swelling. Denies abdominal pain, nausea, vomitting, diarrhea or constipation. Denies change in bowel movements or bloody stool. Denies dysuria , frequency, incontinence or hesitancy.  Denies headaches, vertigo, seizures. Denies depression, anxiety or insomnia. Denies  rash, lesions, or itch.     Current Medications (verified): 1)  Glucophage 1000 Mg  Tabs (Metformin Hcl) .... Take One By Mouth Twice Daily 2)  D 1000 Plus  Tabs (Fa-Cyanocobalamin-B6-D-Ca) .Marland Kitchen.. 1 Once Daily 3)  Geritol Complete  Tabs (Iron-Vitamins) .... One Tab By Mouth Once Daily. 4)  Aspirin 81 Mg Tbec (Aspirin) .... Take One Tablet By Mouth Daily 5)  One Touch Lancets and Strips .... For Once Daily Testing 6)  Bystolic 10 Mg Tabs (Nebivolol Hcl) .... Two Tablets in The Morning and Two in The Evening  Allergies (verified): 1)  ! Ace  Inhibitors 2)  ! * Low Tolerance To Anesthesia and Statin 3)  ! * Statin  Past History:  Past medical, surgical, family and social histories (including risk factors) reviewed, and no changes noted (except as noted below).  Past Medical History: Reviewed history from 07/23/2010 and no changes required. 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 Mother died of heart attack Father died hunting accident Sisters 3 living Brother 1 deceased tp esophageal cancer  Social History: Reviewed history from 12/26/2009 and no changes required. retired 2011 Married Three children one died at 8  Never Smoked Alcohol use-no Drug use-no  Review of Systems      See HPI General:  Complains of fatigue; denies chills, fever, and sweats. Eyes:  Denies discharge, eye pain, and red eye. Resp:  Complains of cough and sputum  productive; chest congestion and slight phlegm since he surgery Dec 8 , at times yellow  to cream. MS:  Complains of joint pain, low back pain, mid back pain, and stiffness. Endo:  Denies excessive thirst and excessive urination. Heme:  Denies abnormal bruising and bleeding. Allergy:   Denies hives or rash, itching eyes, and seasonal allergies.  Physical Exam  General:  Well-developed,well-nourished,in no acute distress; alert,appropriate and cooperative throughout examination HEENT: No facial asymmetry,  EOMI, No sinus tenderness, TM's Clear, oropharynx  pink and moist.   Chest: Clear to auscultation bilaterally.  CVS: S1, S2, No murmurs, No S3.   Abd: Soft, Nontender.  MS: Adequate ROM spine, hips, shoulders and knees.  Ext: No edema.   CNS: CN 2-12 intact, power tone and sensation normal throughout.   Skin: Intact, no visible lesions or rashes.  Psych: Good eye contact, normal affect.  Memory intact, not anxious or depressed appearing.   Diabetes Management Exam:    Foot Exam (with socks and/or shoes not present):       Sensory-Monofilament:          Left foot: diminished          Right foot: diminished       Inspection:          Left foot: normal          Right foot: normal       Nails:          Left foot: thickened          Right foot: thickened   Impression & Recommendations:  Problem # 1:  GERD, SEVERE (ICD-530.81) Assessment Improved  The following medications were removed from the medication list:    Lansoprazole 30 Mg Cpdr (Lansoprazole) ..... One cap by mouth once daily  Problem # 2:  IBS (ICD-564.1) Assessment: Unchanged  Problem # 3:  HYPERTENSION (ICD-401.9) Assessment: Improved  The following medications were removed from the medication list:    Clonidine Hcl 0.1 Mg Tabs (Clonidine hcl) ..... Half tablet at midnight Her updated medication list for this problem includes:    Bystolic 10 Mg Tabs (Nebivolol hcl) .Marland Kitchen..Marland Kitchen Two tablets in the morning and two in the evening  Orders: T-Basic Metabolic Panel [id number]) T-Basic Metabolic Panel [id number])  BP today: 128/84 Prior BP: 140/82 (08/22/2010)  Labs Reviewed: K+: 4.0 (08/02/2010) Creat: : 0.84 (08/02/2010)   Chol: 224 (08/02/2010)   HDL: 63 (08/02/2010)   LDL: 134  (08/02/2010)   TG: 133 (08/02/2010)  Problem # 4:  DIABETES MELLITUS, TYPE II (ICD-250.00) Assessment: Comment Only  Her updated medication list for this problem includes:    Glucophage 1000 Mg Tabs (Metformin hcl) .Marland Kitchen... Take one by mouth twice daily    Aspirin 81 Mg Tbec (Aspirin) .Marland Kitchen... Take one tablet by mouth daily Patient advised to reduce carbs and sweets, commit to regular physical activity, take meds as prescribed, test blood sugars as directed, and attempt to lose weight , to improve blood sugar control.  Orders: T- Hemoglobin A1C [id number]) T- Hemoglobin A1C ([id number])  Labs Reviewed: Creat: 0.84 (08/02/2010)    Reviewed HgBA1c results: 6.6 (08/02/2010)  6.8 (03/27/2010)  Complete Medication List: 1)  Glucophage 1000 Mg Tabs (Metformin hcl) .... Take one by mouth twice daily 2)  D 1000 Plus Tabs (Fa-cyanocobalamin-b6-d-ca) .Marland Kitchen.. 1 once daily 3)  Geritol Complete Tabs (Iron-vitamins) .... One tab by mouth once daily. 4)  Aspirin 81 Mg Tbec (Aspirin) .... Take one tablet by mouth daily 5)  One  Touch Lancets and Strips  .... For once daily testing 6)  Bystolic 10 Mg Tabs (Nebivolol hcl) .... Two tablets in the morning and two in the evening 7)  Tessalon Perles 100 Mg Caps (Benzonatate) .... Take 1 capsule by mouth three times a day,as needed for chest congestion 8)  Penicillin V Potassium 500 Mg Tabs (Penicillin v potassium) .... Take 1 tablet by mouth three times a day 9)  Fluconazole 150 Mg Tabs (Fluconazole) .... Take 1 tablet by mouth once a day as needed for vaginal itch  Other Orders: T-Lipid Profile ([id number]) T-Hepatic Function [id number]) T-TSH [id number])  Patient Instructions: 1)  CPE in 3.5 months. 2)  I am thankful that your surgery went will. 3)  BMP prior to visit, ICD-9: 4)  HbgA1C prior to visit, ICD-9:   today. 5)  BMP prior to visit, ICD-9: 6)  Hepatic Panel prior to visit, ICD-9:  fasting in 3.5 months 7)  Lipid Panel prior to visit,  ICD-9: 8)  TSH prior to visit, ICD-9: 9)  HbgA1C prior to visit, ICD-9: 10)  It is important that you exercise regularly at least 30 minutes 5 times a week. If you develop chest pain, have severe difficulty breathing, or feel very tired , stop exercising immediately and seek medical attention. 11)  You need to lose weight. Consider a lower calorie diet and regular exercise.  Prescriptions: FLUCONAZOLE 150 MG TABS (FLUCONAZOLE) Take 1 tablet by mouth once a day as needed for vaginal itch  #3 x 0   Entered and Authorized by:   Syliva Overman MD   Signed by:   Syliva Overman MD on 11/27/2010   Method used:   Printed then faxed to ...       [facility] (retail)       [address]       [place], [place]  [address]       Ph: [phone]       Fax: [phone]   RxID:   [id number] PENICILLIN V POTASSIUM 500 MG TABS (PENICILLIN V POTASSIUM) Take 1 tablet by mouth three times a day  #21 x 0   Entered and Authorized by:   Syliva Overman MD   Signed by:   Syliva Overman MD on 11/27/2010   Method used:   Electronically to        [facility] (retail)       [address]       [place], [place]  [address]       Ph: [phone]       Fax: [phone]   RxID:   [id number] TESSALON PERLES 100 MG CAPS (BENZONATATE) Take 1 capsule by mouth three times a day,as needed for chest congestion  #30 x 0   Entered and Authorized by:   Syliva Overman MD   Signed by:   Syliva Overman MD on 11/27/2010   Method used:   Electronically to        [facility] (retail)       [address]       [place], [place]  [address]       Ph: [phone]       Fax: [phone]   RxID:   [id number]    Orders Added: 1)  Est. Patient Level IV [71696] 2)  T-Basic Metabolic Panel [[id number]] 3)  T- Hemoglobin A1C [83036-23375] 4)  T-Basic Metabolic Panel [[id number]] 5)  T-Lipid  Profile [80061-22930] 6)  T-Hepatic Function [80076-22960] 7)  T- Hemoglobin A1C [83036-23375] 8)  T-TSH [16109-60454]

## 2010-12-12 ENCOUNTER — Ambulatory Visit (HOSPITAL_COMMUNITY): Payer: Self-pay | Admitting: Oncology

## 2010-12-14 ENCOUNTER — Other Ambulatory Visit: Payer: Self-pay | Admitting: Family Medicine

## 2010-12-18 ENCOUNTER — Other Ambulatory Visit: Payer: Self-pay | Admitting: Family Medicine

## 2011-02-05 NOTE — Assessment & Plan Note (Signed)
[facility]                            CARDIOLOGY OFFICE NOTE   SAMMI, STOLARZ                      MRN:          [id number]  DATE:08/01/2008                            DOB:          04/27/1946    IDENTIFICATION:  Ms. Luera is a 65 year old who is referred by Dr.  Lodema Hong for evaluation of cardiac status.   HISTORY OF PRESENT ILLNESS:  The patient was actually recently seen by  Dr. Francella Solian complaining of cough, shortness of breath.  This was on  October 30.  He felt cough may be related to reflux and modifications  were made.  On reviewing the chart there is some history of  noncompliance with her blood pressure medicine  She was seen in  September for an acute bronchitis.  Blood pressure on that day was  160/90.  She did complain of fatigue, overwhelmed feeling, crying.   She has no known history of coronary artery disease.  She does note  tightness, some shortness of breath with bike, now seems to be more  lethargic, just tired all day.  Tightness in her chest is worse with  some activity, also with lying flat she has four pillow orthopnea.  She  notes some difficulty with solids.   ALLERGIES:  DEMEROL.  SHE HAS A LOW TOLERANCE FOR ANESTHETICS.   CURRENT MEDICINES:  1. Include Bystolic 5 daily.  2. Aciphex 20 b.i.d.  3. Multivitamin.  4. Glucophage daily, question 1 gram.  5. Vitamin D.  6. HyoMax 0.125?  7. Pristiq 50 daily.  8. Aspirin 81 mg.   PAST MEDICAL HISTORY:  1. Hypertension.  2. GE reflux.  3. Diabetes since 1999.   SOCIAL HISTORY:  The patient does not smoke, drinks occasionally.   REVIEW OF SYSTEMS:  Has not been to the gym regularly, used to be more  regular.  Notes again shortness of breath, history of anemia in the  past, history of __________ ,  history of reflux, history of anxiety  depression.   FAMILY HISTORY:  Mother had coronary artery disease in her 15s, 57s.  Father died in a hunting accident age  74.   PHYSICAL EXAM:  On exam the patient is in no distress.  Blood pressure  is 143/83, pulse is 85 and regular.  Weight 205.  HEENT:  Normocephalic, atraumatic, EOMI, PERL.  Mucous membranes are  moist.  NECK:  No bruits.  JVP is normal.  No thyromegaly.  LUNGS:  Are clear.  No rales or wheezes.  CARDIAC EXAM:  Regular rate and rhythm, S1, S2.  No S3, no significant  murmurs.  ABDOMEN:  Is benign.  No hepatomegaly.  Normal bowel sounds, obese.  No  masses.  EXTREMITIES:  Good distal pulses.  No edema.   12-lead EKG shows normal sinus rhythm,  82 beats per minute.  T-wave  inversion in the inferolateral leads.  No EKGs to compare.   IMPRESSION:  Ms. Ruppel is a 65 year old woman with a history of  shortness of breath, fatigue and some chest tightness.  On review of her  records it  did look like her left ventricular function may have been  down in the past.  Note she has had a Myoview that was normal back in  2002.   What I have recommended for now is that we go ahead and get an  echocardiogram to define her left ventricular function.  We will follow  up with her based on this and where to proceed.  Continue current  regimen for now.   HEALTH CARE MAINTENANCE:  1. We will need to review her lipid status.  2. Diabetes, on oral agents.  3. Hypertension, fair control.     Pricilla Riffle, MD, [facility]  Electronically Signed    PVR/MedQ  DD: 08/01/2008  DT: 08/01/2008  Job #: [id number]   cc:   Milus Mallick. Lodema Hong, M.D.

## 2011-02-05 NOTE — Assessment & Plan Note (Signed)
[name], [name]                CHART#:  [id number]   DATE:  07/28/2008                       DOB:  [birth year]   REFERRING PHYSICIAN:  Milus Mallick. Lodema Hong, MD   REASON FOR CONSULTATION:  Diarrhea.   HISTORY OF PRESENT ILLNESS:  [name] is a 65 year old female, who  has had problems with her bowels since at least 2006 when she saw Dr.  Charna Elizabeth in [place] for upper endoscopy and colonoscopy.  She  states she has always had urinary problems.  She said her symptoms  have gotten worse.  No particular foods cause her to go to the bathroom  or severe bloating.  She easily gets heartburn after she eats.  She says  her reflux is not related to food.  She does not lie flat because she  can feel it bubbling up into her chest.  She does sleep on pills.  She  has bowel movements that are precipitated by solid food.  She still has  her gallbladder.  She tries to stay away from dairy.  She denies any  fever.  Prior to bowel movement, she has crampiness that is relieved  with passing stool.  Sometimes, she is constipated.  She stated when she  took the prednisone, she did not have problem with diarrhea and had  normal stool.  She has gained 20 pounds in the last 3 years.   PAST MEDICAL HISTORY:  1. GERD.  2. Diabetes since 1999.  3. Persistent cough.  4. Breast cancer requiring chemo and tamoxifen for 5 years, no XRT.   PAST SURGICAL HISTORY:  1. Three C-section.  2. Hysterectomy for fibroids.  3. Modified radical mastectomy.  4. Kidney stones.   ALLERGIES:  Low tolerance to anesthesia and statin.   MEDICATIONS:  1. Glucophage 1 g b.i.d. since 1999.  2. Aciphex 20 mg daily.  3. Pristiq 50 mg daily.  4. Bystolic 5 mg daily.  5. Prednisone finished today.  6. Tramadol as needed.  7. Geritol.  8. Vitamin E.   FAMILY HISTORY:  She denies any family history of colon cancer or colon  polyps.  A brother had esophageal cancer, passed away.   SOCIAL HISTORY:  She is married  and has three children.  She currently  works as a Arts administrator at [employer].  She denies any tobacco or  alcohol use.   REVIEW OF SYSTEMS:  Per the HPI, otherwise all systems negative.   PHYSICAL EXAMINATION:  VITAL SIGNS:  Weight 205 pounds, height 5 feet 6  inches, BMI 33.1 (obese), temperature 98.4, blood pressure 140/84, and  pulse 72.GENERAL:  She is in no apparent distress.  Alert and oriented  x4.HEENT:  Atraumatic and normocephalic.  Pupils are equal and reactive  to light.  Mouth, no oral lesions.  Posterior pharynx without erythema  or exudate.LUNGS:  Clear to auscultation bilaterally.CARDIOVASCULAR:  Regular rhythm.  No murmur.  Normal S1 and S2.ABDOMEN:  Bowel sounds are  present, soft, nontender, nondistended.  No rebound or  guarding.EXTREMITIES:  No cyanosis or edema.NEUROLOGIC:  She has no  focal neurologic deficit.   ASSESSMENT:  The patient is a 65 year old female, who has severe  bloating and cannot lie flat due to regurgitation.  The differential  diagnoses include inadequately treated gastroesophageal reflux disease  and gastroparesis.  Her diarrhea  may be secondary to irritable bowel,  microscopic colitis, diabetic enteropathy, and a low likelihood of small  bowel bacterial overgrowth. Thank you for allowing me to see [name]  in consultation.  My recommendations follow.   RECOMMENDATIONS:  1. Schedule gastric emptying study.  2. She is instructed to follow a low-fat, dairy-free diet.  3. She will be scheduled for a flexible sigmoidoscopy to biopsy and      determine whether or not she has microscopic colitis.  She is asked      to use Levsin one or two 30 minutes prior to meals.  She is      cautioned it may cause drowsiness, dry eyes, dry mouth, and urinary      retention.  4. She is given a handout on a lactose-free, low-fat diet.  She is      also given the [facility] GERD recommendations.  5. She has a follow up appointment to see me in 6  weeks.  She may need      a hydrogen breath test.   PATH:  GES-92% retention at 2 hrs.       Kassie Mends, M.D.  Electronically Signed    SM/MEDQ  D:  07/28/2008  T:  07/28/2008  Job:  [id number]   cc:   Milus Mallick. Lodema Hong, M.D.

## 2011-02-05 NOTE — Op Note (Signed)
[name], [name]            ACCOUNT NO.:  [id number]   MEDICAL RECORD NO.:  [id number]          PATIENT TYPE:  AMB   LOCATION:  DAY                           FACILITY:  [facility]   PHYSICIAN:  Kassie Mends, M.D.      DATE OF BIRTH:  1945-11-17   DATE OF PROCEDURE:  08/08/2008  DATE OF DISCHARGE:                               OPERATIVE REPORT   REFERRING PHYSICIAN:  Milus Mallick. Lodema Hong, MD   PROCEDURES:  1. Flexible sigmoidoscopy with cold forceps biopsy.  2. Esophagogastroduodenoscopy with cold forceps biopsy.   INDICATIONS FOR EXAM:  [name] is a 65 year old female with  abdominal pain, bloating, and diarrhea.  She had a gastric emptying  study that showed 9% emptying in 2 hours.  The flexible sigmoidoscopy is  being performed to evaluate for microscopic colitis and the upper  endoscopy is to evaluate for gastric outlet obstruction.   FINDINGS:  1. Good bowel prep from the rectum to approximately 40 cm from the      anal verge.  The scope was passed 60 cm from the anal verge.  No      polyps, masses, or inflammatory changes were seen.  No AVMs.      Limited view of the mucosa from 40 cm-60 cm beyond the anal verge.      Polyps less than 1-cm would have been easily missed.  The      visualization was limited due to retained stool.  No solid stool      was seen.  Occasional diverticula seen.  2. Patent Schatzki ring.  Otherwise, no evidence of Barrett mass,      erosions, or ulcerations.  3. Patchy erythema in the antrum with occasional erosion.  Biopsies      obtained via cold forceps to evaluate for H. pylori gastritis.  4. Normal duodenal bulb and second portion of the duodenum.  No bile      staining seen in the second portion of the duodenum.   DIAGNOSES:  1. Mild diverticulosis.  2. Gastritis.   RECOMMENDATIONS:  1. [name] should follow a gastroparesis diet within the confines      of her diabetic diet.  She is given a gastroparesis handout.  We      will  start Reglan 5 mg before her first and last meal.  She should      take it 30 minutes prior to meals.  2. Will call her with the results of her biopsies.  3. No aspirin, NSAIDs, or anticoagulation for 5 days.  4. She already has a follow up appointment to see me in 6 weeks.  5. Discussed the side effects of Reglan with her husband.  The side      effects are also recorded on the discharge sheet.  6. Screening colonoscopy in 2016.  7. She should continue the Levsin.   MEDICATIONS:  1. Demerol 100 mg IV.  2. Versed 5 mg IV.   PROCEDURE TECHNIQUE:  Physical exam was performed.  Informed consent was  obtained from the patient after explaining the benefits, risks, and  alternatives to  the procedure.  The patient was connected to the monitor  and placed in left lateral position.  Continuous oxygen was provided by  nasal cannula and IV medicine administered through an indwelling  cannula.  After administration of sedation and rectal exam, the  patient's rectum was intubated and the scope was advanced direct  visualization to 60 cm above the anal verge.  Scope was removed slowly  by carefully examining the color, texture, anatomy, and integrity of the  mucosa on the way out.   After the sigmoidoscopy, the patient's esophagus was intubated with a  diagnostic gastroscope and the scope was advanced under direct  visualization to the second portion of the duodenum.  The scope was  removed slowly by carefully examining the color, texture, anatomy, and  integrity of the mucosa on the way out.  The patient was recovered in  endoscopy and discharged home in satisfactory condition.   PATH:  Reactive gastropathy. Nl colon.      Kassie Mends, M.D.  Electronically Signed     SM/MEDQ  D:  08/08/2008  T:  08/08/2008  Job:  [id number]   cc:   Milus Mallick. Lodema Hong, M.D.  Fax: [phone]

## 2011-02-08 NOTE — Op Note (Signed)
[name], [name]               ACCOUNT NO.:  [id number]   MEDICAL RECORD NO.:  [id number]          PATIENT TYPE:  AMB   LOCATION:  ENDO                         FACILITY:  [facility]   PHYSICIAN:  Anselmo Rod, M.D.  DATE OF BIRTH:  March 17, 1946   DATE OF PROCEDURE:  03/08/2005  DATE OF DISCHARGE:  03/08/2005                                 OPERATIVE REPORT   PROCEDURES PERFORMED:  Esophagogastroduodenoscopy with multiple biopsies.   ENDOSCOPIST:  Anselmo Rod, M.D.   INSTRUMENT USED:  Olympus video panendoscope.   INDICATION FOR PROCEDURE:  A 65 year old African American female with a  history of rectal bleeding and abdominal pain.  Rule out peptic ulcer  disease, esophagitis, gastritis, etc.   PREPROCEDURE PREPARATION:  Informed consent was procured from the patient.  The patient fasted for eight hours prior to the procedure.   PREPROCEDURE PHYSICAL EXAMINATION:  VITAL SIGNS:  Stable.  NECK:  Supple.  CHEST:  Clear to auscultation.  HEART:  S1 and S2 regular.  ABDOMEN:  Soft with normal bowel sounds.   DESCRIPTION OF PROCEDURE:  The patient was placed in the left lateral  decubitus position.  Sedated with 75 mg of Demerol and 7.5 mg of Versed in  slow incremental doses. Once the patient was adequately sedated and  maintained on low-flow oxygen and continuous cardiac monitoring, the Olympus  video panendoscope was advanced through the mouth, over the tongue and into  the esophagus under direct vision.  The entire esophagus appeared normal  with no evidence of ring stricture, masses, esophagitis or Barrett's mucosa.  The scope was then advanced to the stomach.  Three small antral ulcers were  noticed.  Biopsies were done for Helicobacter pylori.  Diffuse gastritis was  also appreciated.  The proximal small bowel and esophagus appeared normal.  There was outlet obstruction.   IMPRESSION:  1.  Three small antral ulcers noted with diffuse gastritis.  Biopsies done      for  Helicobacter pylori.  2.  Normal-appearing esophagus.   RECOMMENDATIONS:  1.  Await pathology results.  2.  Avoid all nonsteroidals for now.  3.  PPI of choice.  4.  Proceed with a colonoscopy.  5.  Further recommendations made thereafter.       JNM/MEDQ  D:  03/09/2005  T:  03/11/2005  Job:  [id number]   cc:   Merlene Laughter. Renae Gloss, M.D.  [address]  [address]  [place]  [place] [address]  Fax: [phone]

## 2011-02-08 NOTE — Op Note (Signed)
NAMEKAYANN, MAJ                         ACCOUNT NO.:  [id number]   MEDICAL RECORD NO.:  [id number]                   PATIENT TYPE:  AMB   LOCATION:  NESC                                 FACILITY:  [facility]   PHYSICIAN:  Claudette Laws, M.D.               DATE OF BIRTH:  15-Oct-1945   DATE OF PROCEDURE:  07/11/2003  DATE OF DISCHARGE:                                 OPERATIVE REPORT   PREOPERATIVE DIAGNOSES:  History of bilateral ureteral calculi with renal  colic.   POSTOPERATIVE DIAGNOSES:  History of bilateral ureteral calculi with renal  colic.   OPERATION:  1. Cystoscopy, right retrograde pyeloureterogram, and insertion of a 6     French 28 cm double-J stent.  2. Rigid left ureteroscopy.   SURGEON:  Claudette Laws, M.D.   HISTORY:  This is a 65 year old diabetic lady, who presented in my office  four days ago with bilateral ureteral stones by CT scan.  She initially  presented in the [facility] Emergency Room and then was seen by me later on  in the day.  I reviewed the x-rays which showed about a 7-8 mm proximal  right ureteral stone and also what appeared to be a small 2-3 mm stone in  the distal left ureter.  Her BUN and creatinine were stable.  She was not  septic.  Afebrile.  We went over treatment options, and the decision was  made to put her on antibiotics and then bring her in today for a double-AJ  stent up the right side in preparation for future lithotripsy and also a  left ureteroscopy and stone extraction if the stone was still there.  She  understands and agrees to the proposed surgery.  She understands possible  complications such as inadvertent perforation of the ureter, inability to  extract the left ureteral stone, and inability to bypass the right ureteral  stone.  She understands the possibility of bleeding, infection, sepsis  postop since she is a diabetic.   DESCRIPTION OF PROCEDURE:  The patient was prepped and draped in the dorsal  lithotomy position under LMA anesthesia.  Incidentally, a preop KUB x-ray  showed the stone now in the mid right ureter.  There was no definite stone  in the left ureter.  She also has a larger right lower pole renal calculus.   The bladder itself looked grossly normal.  No obvious tumors.  No calculi.  She had a peculiar high-riding trigone with the ureteral orifices situated  somewhat anteriorly.  Initially, I was able to intubate the right ureteral  orifice with a 6 [place] open-ended ureteral catheter.  Retrograde studies  were performed showing rather delicate calices, no obvious hydronephrosis,  no obvious filling defects.  A .038 Glidewire was passed up under  fluoroscopic control, positioned in the kidney.  The 6 French 28 cm double-  AJ stent was passed up again using  fluoroscopic control.  The proximal end  was curled up in the renal pelvis.  The distal end was curled up in the  bladder.   Our attention was then turned to the left ureter where again it was  intubated with the 6 [place] open-ended ureteral catheter.  A Glidewire was  passed up.  We then passed up a 6 [place] short rigid ureteroscope under  direct vision and passed up the scope all the way beyond the iliac vessels  but never did identify a ureteral calculus.  She did have evidence of some  sessile inflammatory-like lesions, I thought, along the ureter which we took  pictures of.  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  In any event, I did not feel  that biopsy was necessary.  Otherwise, the ureter itself was wide open.  We  then backed out all instruments, emptied her bladder.  A B&O suppository was  placed per rectum for anesthetic purposes, and she was taken back to the  recovery room in satisfactory condition.                                               Claudette Laws, M.D.    RFS/MEDQ  D:  07/11/2003  T:  07/11/2003  Job:  [id number]

## 2011-02-08 NOTE — H&P (Signed)
   [name], [name]                         ACCOUNT NO.:  [id number]   MEDICAL RECORD NO.:  [id number]                  PATIENT TYPE:   LOCATION:                                       FACILITY:  [facility]   PHYSICIAN:  Dirk Dress. Katrinka Blazing, M.D.                DATE OF BIRTH:  1946/02/17   DATE OF ADMISSION:  06/22/2003  DATE OF DISCHARGE:                                HISTORY & PHYSICAL   HISTORY OF PRESENT ILLNESS:  A 65 year old female with history of increased  fullness in her chest, tenderness in the mid abdomen with increasing  evidence of post prandial diarrhea. The patient has been undergoing workup  which so far, has been negative. Gallbladder HIDA scan is normal. CT of the  abdomen is unremarkable except for fatty infiltration of the liver.  Abdominal ultrasound is unremarkable. Chest x-ray is normal. Bone scan is  normal. The patient is scheduled for upper and lower endoscopy as part of  her continued workup.   PAST MEDICAL HISTORY:  She has diabetes mellitus type 2 and hypertension.  Stage 1 breast cancer, status post right modified radical mastectomy. Status  post completion of chemotherapy starting in 1997. She has a history of non-  cardiac chest pain with negative stress Cardiolite and negative  2-D echocardiogram with normal ejection fraction. There is a history of  rectal bleeding, peptic ulcer disease, right ear hearing deficit.   PAST SURGICAL HISTORY:  Other surgery includes appendectomy, cesarean  section and hysterectomy.   MEDICATIONS:  Toprol XL 25 mg q.d., Bentyl 20 mg a.c. and h.s., Glucophage  XR 500 mg b.i.d., Maxzide 25 q.d., Paxil 25 mg q.d., Xanax 0.5 mg t.i.d.   PHYSICAL EXAMINATION:  VITAL SIGNS: Blood pressure 120/90, pulse 118,  respiratory rate 20, weight 222 pounds.  HEENT: Unremarkable.  NECK: Supple. No JVD or bruit.  CHEST: Clear to auscultation. No rales, rubs, rhonchi, or wheezes.  BREAST: Well healed right chest wall. Normal left breast.  HEART: Regular rate and rhythm. Without murmur, rub, or gallop.  ABDOMEN: Obese, soft, nontender. No palpable organomegaly.  EXTREMITIES: No clubbing, cyanosis, or edema.  NEUROLOGIC: No focal, motor, sensory, or cerebellar deficit.   IMPRESSION:  1. Recurrent episodes of abdominal pain, nausea, vomiting with history of     rectal bleeding.  2. Progressive postprandial diarrhea.  3. Anxiety disorder.  4.     Diabetes mellitus.  5. History of stage 1 breast cancer.   PLAN:  EGD and colonoscopy.     ___________________________________________                                         Dirk Dress. Katrinka Blazing, M.D.   LCS/MEDQ  D:  06/21/2003  T:  06/22/2003  Job:  [id number]

## 2011-02-08 NOTE — Procedures (Signed)
   [name], [name]                         ACCOUNT NO.:  [id number]   MEDICAL RECORD NO.:  [id number]                   PATIENT TYPE:  OUT   LOCATION:  RAD                                  FACILITY:  [facility]   PHYSICIAN:  Vida Roller, M.D.                DATE OF BIRTH:  1946/04/02   DATE OF PROCEDURE:  05/20/2003  DATE OF DISCHARGE:  01/28/2003                                  ECHOCARDIOGRAM   TAPE NUMBER:  [id number]   TAPE COUNT:  [id number]   CLINICAL INFORMATION:  This is a 65 year old woman with dyspnea and chest  heaviness.  Attention is directed towards a previous echocardiogram done in  May of 2002 which shows normal left ventricular function.  No significant  valvular abnormalities and moderate left ventricular hypertrophy.   TECHNICAL QUALITY:  Adequate.   M-MODE MEASUREMENTS:  1. The aorta is 32 mm.  2. The left atrium is 44 mm.  3. The septum is 10 mm.  4. The posterior wall is 11 mm.  5. The left ventricular diastolic dimension is 42 mm.  6. The left ventricular systolic dimension is 34 mm.   2-D AND DOPPLER IMAGING:  1. The left ventricle is top-normal size with evidence of mild hypokinesis     which appears to be global.  Estimated ejection fraction is 40-45%.     Diastolic function was not assessed.  2. The right ventricle is normal size with normal systolic function.  3. The right atrium is normal size.  The left atrium is slightly enlarged.     There is no atrioseptal defect seen.  4. The aortic valve is trileaflet and tricommisural with only very mild     sclerosis.  There is no stenosis or regurgitation seen.  5. The mitral valve is morphologically unremarkable with no stenosis or     regurgitation.  6. The tricuspid valve is morphologically unremarkable with mild     insufficiency.  No stenosis is seen.  7. The pulmonic valve was not well seen.  8. The pericardial structures appear normal.  9. The inferior vena cava appears normal.  10.      The  ascending aorta is not well seen.                                               Vida Roller, M.D.   JH/MEDQ  D:  05/20/2003  T:  05/20/2003  Job:  [id number]

## 2011-02-08 NOTE — Op Note (Signed)
[name], [name]               ACCOUNT NO.:  [id number]   MEDICAL RECORD NO.:  [id number]          PATIENT TYPE:  AMB   LOCATION:  ENDO                         FACILITY:  [facility]   PHYSICIAN:  Anselmo Rod, M.D.  DATE OF BIRTH:  07/20/1946   DATE OF PROCEDURE:  03/07/2005  DATE OF DISCHARGE:  03/08/2005                                 OPERATIVE REPORT   PROCEDURE PERFORMED:  Screening colonoscopy.   ENDOSCOPIST:  Anselmo Rod, M.D.   INSTRUMENT USED:  Olympus video colonoscope.   INDICATIONS FOR PROCEDURE:  A 65 year old African-American female undergoing  screening colonoscopy to rule out colonic polyps, masses, etcetera.  The  patient has a history of occasional rectal bleeding, which she attributes to  her hemorrhoids.   PREPROCEDURE PREPARATION AND INFORMED CONSENT:  The patient fasted for 8  hours prior to the procedure and prepped with a bottle of magnesium citrate  and a gallon of GoLYTELY the night prior to the procedure.  Risks and  benefits of the procedure, including a 10% miss rate of cancer, were  discussed with the patient as well.   PREPROCEDURE PHYSICAL:  The patient had stable vital signs.  Neck supple.  Chest clear to auscultation.  Sinus rhythm.  Abdomen soft with normal bowel  sounds.   DESCRIPTION OF PROCEDURE:  The patient was placed in the left lateral  decubitus position.  No additional sedation was used after EGD.  Once the  patient was adequately positioned, the Olympus video colonoscope was  advanced from the rectum to the cecum.  The appendiceal orifice and  ileocecal valve were visualized and photographed.  A small isolated  diverticulum was noted in the right colon.  No masses, polyps, erosions, or  ulcerations were seen.  Small internal hemorrhoids were appreciated on  retroflexion in the rectum.  The patient tolerated the procedure well  without complications.   IMPRESSION:  Normal colonoscopy to the cecum except for small internal  hemorrhoids and an isolated diverticulum in the right colon.  No masses or  polyps seen.   RECOMMENDATIONS:  1.  Continue high-fiber diet with adequate fluid intake.  2.  Outpatient follow up in the next 2 weeks for further recommendations.  3.  Repeat colonoscopy in the next 5 years unless the patient develops any      abnormal symptoms in the interim.       JNM/MEDQ  D:  03/09/2005  T:  03/10/2005  Job:  [id number]   cc:   Merlene Laughter. Renae Gloss, M.D.  [address]  [address]  [place]  [place] [address]  Fax: [phone]

## 2011-03-20 ENCOUNTER — Other Ambulatory Visit: Payer: Self-pay | Admitting: Family Medicine

## 2011-04-03 ENCOUNTER — Encounter: Payer: Self-pay | Admitting: Family Medicine

## 2011-04-10 ENCOUNTER — Other Ambulatory Visit: Payer: Self-pay | Admitting: Family Medicine

## 2011-04-10 ENCOUNTER — Other Ambulatory Visit (HOSPITAL_COMMUNITY)
Admission: RE | Admit: 2011-04-10 | Discharge: 2011-04-10 | Disposition: A | Discharge status: Home / Self Care | Payer: 59 | Source: Ambulatory Visit | Attending: Family Medicine | Admitting: Family Medicine

## 2011-04-10 ENCOUNTER — Ambulatory Visit (INDEPENDENT_AMBULATORY_CARE_PROVIDER_SITE_OTHER): Payer: 59 | Admitting: Family Medicine

## 2011-04-10 ENCOUNTER — Encounter: Payer: Self-pay | Admitting: Family Medicine

## 2011-04-10 VITALS — BP 122/80 | HR 82 | Resp 16 | Ht 66.5 in | Wt 198.1 lb

## 2011-04-10 DIAGNOSIS — R5383 Other fatigue: Secondary | ICD-10-CM

## 2011-04-10 DIAGNOSIS — Z0000 Encounter for general adult medical examination without abnormal findings: Secondary | ICD-10-CM

## 2011-04-10 DIAGNOSIS — R5381 Other malaise: Secondary | ICD-10-CM

## 2011-04-10 DIAGNOSIS — Z1211 Encounter for screening for malignant neoplasm of colon: Secondary | ICD-10-CM

## 2011-04-10 DIAGNOSIS — E669 Obesity, unspecified: Secondary | ICD-10-CM

## 2011-04-10 DIAGNOSIS — E119 Type 2 diabetes mellitus without complications: Secondary | ICD-10-CM

## 2011-04-10 DIAGNOSIS — I10 Essential (primary) hypertension: Secondary | ICD-10-CM

## 2011-04-10 DIAGNOSIS — Z01419 Encounter for gynecological examination (general) (routine) without abnormal findings: Secondary | ICD-10-CM | POA: Insufficient documentation

## 2011-04-10 DIAGNOSIS — C50911 Malignant neoplasm of unspecified site of right female breast: Secondary | ICD-10-CM

## 2011-04-10 DIAGNOSIS — Z124 Encounter for screening for malignant neoplasm of cervix: Secondary | ICD-10-CM

## 2011-04-10 DIAGNOSIS — K219 Gastro-esophageal reflux disease without esophagitis: Secondary | ICD-10-CM

## 2011-04-10 DIAGNOSIS — Z13820 Encounter for screening for osteoporosis: Secondary | ICD-10-CM

## 2011-04-10 DIAGNOSIS — E785 Hyperlipidemia, unspecified: Secondary | ICD-10-CM

## 2011-04-10 LAB — POC HEMOCCULT BLD/STL (OFFICE/1-CARD/DIAGNOSTIC): Fecal Occult Blood, POC: NEGATIVE

## 2011-04-10 MED ORDER — LANSOPRAZOLE 30 MG PO CPDR: 30.0000 mg | DELAYED_RELEASE_CAPSULE | Freq: Every day | ORAL | Status: DC

## 2011-04-10 NOTE — Patient Instructions (Addendum)
F/U  In 3 months.  Labs today.  It is important that you exercise regularly at least 30 minutes 5 times a week. If you develop chest pain, have severe difficulty breathing, or feel very tired, stop exercising immediately and seek medical attention    A healthy diet is rich in fruit, vegetables and whole grains. Poultry fish, nuts and beans are a healthy choice for protein rather then red meat. A low sodium diet and drinking 64 ounces of water daily is generally recommended. Oils and sweet should be limited. Carbohydrates especially for those who are diabetic or overweight, should be limited to 34-45 gram per meal. It is important to eat on a regular schedule, at least 3 times daily. Snacks should be primarily fruits, vegetables or nuts.   hBA1C in 3 months.  Mammogram past due , we will schedule

## 2011-04-11 LAB — CBC WITH DIFFERENTIAL/PLATELET
Basophils Absolute: 0.1 10*3/uL (ref 0.0–0.1)
Basophils Relative: 1 % (ref 0–1)
Hemoglobin: 12 g/dL (ref 12.0–15.0)
MCHC: 32.8 g/dL (ref 30.0–36.0)
Monocytes Relative: 7 % (ref 3–12)
Neutro Abs: 4.8 10*3/uL (ref 1.7–7.7)
Neutrophils Relative %: 49 % (ref 43–77)

## 2011-04-11 LAB — COMPLETE METABOLIC PANEL WITH GFR
CO2: 26 mEq/L (ref 19–32)
Creat: 0.78 mg/dL (ref 0.50–1.10)
GFR, Est African American: >60 mL/min (ref >60)
GFR, Est Non African American: >60 mL/min (ref >60)
Glucose, Bld: 98 mg/dL (ref 70–99)
Total Bilirubin: 0.4 mg/dL (ref 0.3–1.2)
Total Protein: 7.6 g/dL (ref 6.0–8.3)

## 2011-04-11 LAB — LIPID PANEL
Cholesterol: 227 mg/dL — ABNORMAL HIGH (ref 0–200)
HDL: 59 mg/dL (ref >39)
Triglycerides: 150 mg/dL — ABNORMAL HIGH (ref <150)

## 2011-04-11 LAB — TSH: TSH: 1.085 u[IU]/mL (ref 0.350–4.500)

## 2011-04-11 LAB — HEMOGLOBIN A1C
Hgb A1c MFr Bld: 6.5 % — ABNORMAL HIGH (ref <5.7)
Mean Plasma Glucose: 140 mg/dL — ABNORMAL HIGH (ref <117)

## 2011-04-12 LAB — VITAMIN D 1,25 DIHYDROXY: Vitamin D 1, 25 (OH)2 Total: 46 pg/mL (ref 18–72)

## 2011-04-15 ENCOUNTER — Encounter: Payer: Self-pay | Admitting: Family Medicine

## 2011-04-15 ENCOUNTER — Ambulatory Visit (HOSPITAL_COMMUNITY)
Admission: RE | Admit: 2011-04-15 | Discharge: 2011-04-15 | Disposition: A | Discharge status: Home / Self Care | Payer: 59 | Source: Ambulatory Visit | Attending: Family Medicine | Admitting: Family Medicine

## 2011-04-15 DIAGNOSIS — Z1231 Encounter for screening mammogram for malignant neoplasm of breast: Secondary | ICD-10-CM | POA: Insufficient documentation

## 2011-04-15 DIAGNOSIS — C50911 Malignant neoplasm of unspecified site of right female breast: Secondary | ICD-10-CM

## 2011-04-15 NOTE — Assessment & Plan Note (Signed)
Improved. Pt applauded on succesful weight loss through lifestyle change, and encouraged to continue same. Weight loss goal set for the next several months.  

## 2011-04-15 NOTE — Progress Notes (Signed)
  Subjective:    Patient ID: [name], female    DOB: [birth year], 65 y.o.   MRN: [id number]  HPI The PT is here for annual exam  and re-evaluation of chronic medical conditions, medication management and review of recent lab and radiology data.  Preventive health is updated, specifically  Cancer screening, Osteoporosis screening and Immunization.   Questions or concerns regarding consultations or procedures which the PT has had in the interim are  addressed. The PT denies any adverse reactions to current medications since the last visit.  There are no new concerns.  There are no specific complaints   HYPERTENSION Disease Monitoring Blood pressure range-unknown Chest pain- no      Dyspnea- no Medications Compliance- good Lightheadedness- no   Edema- no   DIABETES Disease Monitoring Blood Sugar ranges-fasting 100 to 120 Polyuria- no New Visual problems- no Medications Compliance- good Hypoglycemic symptoms- no   HYPERLIPIDEMIA Disease Monitoring See symptoms for Hypertension Medications Compliance- n/a intolerant RUQ pain- no  Muscle aches- reports this occurs with all statins        Review of Systems Denies recent fever or chills. Denies sinus pressure, nasal congestion, ear pain or sore throat. Denies chest congestion, productive cough or wheezing. Denies chest pains, palpitations, paroxysmal nocturnal dyspnea, orthopnea and leg swelling Denies abdominal pain, nausea, vomiting,she has chronic diarreah from iBS  Denies rectal bleeding or change in bowel movement. Denies dysuria, frequency, hesitancy or incontinence. Improved back pain since her surgery, still has some limitation in mobility Denies headaches, seizure, numbness, or tingling. Denies depression, anxiety or insomnia. Denies skin break down or rash.        Objective:   Physical Exam Pleasant well nourished female, alert and oriented x 3, in no cardio-pulmonary distress. Afebrile. HEENT No  facial trauma or asymetry.  No sinus tenderness EOMI, PERTL, fundoscopic exam is normal, no hemorhage or exudate. External ears normal, tympanic membranes clear. Oropharynx moist, no exudate, poor dentition. Neck: supple, no adenopathy,JVD or thyromegaly.No bruits.  Chest: Clear to ascultation bilaterally.No crackles or wheezes. Non tender to palpation  Breast: Right mastectomy,no masses. No nipple discharge or inversion. No axillary or supraclavicular adenopathy  Cardiovascular system; Heart sounds normal,  S1 and  S2 ,no S3.  No murmur, or thrill. Apical beat not displaced Peripheral pulses normal.  Abdomen: Soft, non tender, no organomegaly or masses. No bruits. Bowel sounds normal. No guarding, tenderness or rebound.  Rectal:  No mass. guaic negative stool.  GU: External genitalia normal. No lesions. Vaginal canal normal.No discharge. Uterus absent, no adnexal masses, no adnexal tenderness.  Musculoskeletal exam: Decreased ROM of spine,adequate in  hips , shoulders and knees. No deformity ,swelling or crepitus noted. No muscle wasting or atrophy.   Neurologic: Cranial nerves 2 to 12 intact. Power, tone ,sensation and reflexes normal throughout. No disturbance in gait. No tremor.  Skin: Intact, no ulceration, erythema , scaling or rash noted. Pigmentation normal throughout  Psych; Normal mood and affect. Judgement and concentration normal        Assessment & Plan:

## 2011-04-15 NOTE — Assessment & Plan Note (Signed)
Controlled, no change in medication  

## 2011-04-15 NOTE — Assessment & Plan Note (Signed)
Deteriorated, unfortunately reports intolerance to all lipid lowering agents. Aggressive dietary change needed, increased risk of CAD

## 2011-04-15 NOTE — Assessment & Plan Note (Signed)
Still symptomatic at times, likely due to large abdominal girth

## 2011-04-16 LAB — HEPATITIS PANEL, ACUTE: Hepatitis B Surface Ag: NEGATIVE

## 2011-04-16 LAB — PTH, INTACT AND CALCIUM
Calcium, Total (PTH): 9.9 mg/dL (ref 8.4–10.5)
PTH: 85.1 pg/mL — ABNORMAL HIGH (ref 14.0–72.0)

## 2011-04-24 ENCOUNTER — Encounter (HOSPITAL_COMMUNITY): Payer: Self-pay | Admitting: Oncology

## 2011-04-24 ENCOUNTER — Encounter (HOSPITAL_COMMUNITY): Payer: 59 | Attending: Oncology | Admitting: Oncology

## 2011-04-24 DIAGNOSIS — K589 Irritable bowel syndrome without diarrhea: Secondary | ICD-10-CM

## 2011-04-24 DIAGNOSIS — Z170 Estrogen receptor positive status [ER+]: Secondary | ICD-10-CM

## 2011-04-24 DIAGNOSIS — E669 Obesity, unspecified: Secondary | ICD-10-CM

## 2011-04-24 DIAGNOSIS — C50919 Malignant neoplasm of unspecified site of unspecified female breast: Secondary | ICD-10-CM

## 2011-04-24 NOTE — Progress Notes (Signed)
Nicole Overman, MD, MD [address], [address] [place] [address]  1. ADENOCARCINOMA, BREAST, RIGHT   2. IBS   3. OBESITY     CURRENT THERAPY:S/P right mastectomy on 10/17/1995, followed by adjuvant chemotherapy consisting of AC x 4 cycles followed by Tamoxifen for 5 years ending as of 04/09/2001.  INTERVAL HISTORY: [name] 65 y.o. female returns for  regular  visit for followup of right-sided breast cancer measuring 1.4 x 1.2 cm, grade 2, 8 negative axillary lymph nodes for metastatic disease, ER and PR positive.  Today she reports that her hearing has been decreasing, particularly on the right.  She attributes that to her working environment in the past.  She explains that she has seen someone in the past for her hearing and I recommended that she return for work-up of her decreased hearing.   She denies any complaints today.    Past Medical History  Diagnosis Date  . GERD (gastroesophageal reflux disease)     mann  . Insomnia   . Anxiety disorder   . Hyperlipidemia   . Hypertension   . Obesity   . Diabetes mellitus type 1   . DJD (degenerative joint disease) of lumbar spine   . Degenerative disc disease, lumbar     pressing on L3 and L4  . Adenocarcinoma of breast 1987    right / chemo + tamoxifen x 5 years   . IBS (irritable bowel syndrome)   . Hypersensitivity     in tongue    has HERPES LABIALIS; ADENOCARCINOMA, BREAST, RIGHT; DIABETES MELLITUS, TYPE II; HYPERLIPIDEMIA; OBESITY; HYPERTENSION; Esophageal Reflux; GASTROPARESIS; IBS; UNSPECIFIED HYPERTROPHIC&ATROPHIC CONDITION SKIN; INSOMNIA; FATIGUE; and DYSPNEA on her problem list.     is allergic to ace inhibitors and statins.  [name] does not currently have medications on file.  Past Surgical History  Procedure Date  . Right mastectomy 1997  . Cesarean section     3  . Urological surgery for blocked ureter secondary to kidney stone   . Total abdominal hysterectomy w/ bilateral  salpingoophorectomy   . Appendectomy   . Cataract surgery right eye 04/21/2008  . Bilateral eye surgery 11/2008  . Implant and screws put in the right lower jaw 11/2008    dental surgery  . Spine surgery 2011  . Eye surgery   . Abdominal hysterectomy   . Breast surgery   . Ovarian cyst surgery   . Kidney blockage     ? major surgery    Denies any headaches, dizziness, double vision, fevers, chills, night sweats, nausea, vomiting, diarrhea, constipation, chest pain, heart palpitations, shortness of breath, blood in stool, black tarry stool, urinary pain, urinary burning, urinary frequency, hematuria.   PHYSICAL EXAMINATION  ECOG PERFORMANCE STATUS: 0 - Asymptomatic  Filed Vitals:   04/24/11 1438  BP: 139/83  Pulse: 73  Temp: 98.6 F (37 C)    GENERAL:no distress, well nourished, well developed, comfortable, cooperative, obese and smiling SKIN: skin color, texture, turgor are normal, no rashes or significant lesions HEAD: Normocephalic, No masses, lesions, tenderness or abnormalities EYES: normal, PERRLA, EOMI EARS: External ears normal OROPHARYNX:mucous membranes are moist  NECK: supple, no adenopathy, no bruits, no JVD, thyroid normal size, non-tender, without nodularity, no stridor, non-tender, trachea midline LYMPH:  no palpable lymphadenopathy, no hepatosplenomegaly BREAST:left breast normal without mass, skin or nipple changes or axillary nodes.  Right breast reveals well healed mastecttomy scar.  No masses, lesion, or worrisome findings discovered on the right. LUNGS:  clear to auscultation and percussion HEART: regular rate & rhythm, no murmurs, no gallops, S1 normal and S2 normal ABDOMEN:abdomen soft, non-tender, obese, normal bowel sounds and difficult to assess for hepatosplenomegaly due to body habitus and abdominal strength BACK: Back symmetric, no curvature., No CVA tenderness EXTREMITIES:less then 2 second capillary refill, no joint deformities, effusion, or  inflammation, no edema, no skin discoloration, no clubbing, no cyanosis  NEURO: alert & oriented x 3 with fluent speech, no focal motor/sensory deficits, gait normal  LABORATORY DATA: Lab Results  Component Value Date   WBC 9.8 04/10/2011   HGB 12.0 04/10/2011   HCT 36.6 04/10/2011   MCV 77.9* 04/10/2011   PLT 267 04/10/2011     Chemistry      Component Value Date/Time   NA 140 04/10/2011 1345   K 4.0 04/10/2011 1345   CL 104 04/10/2011 1345   CO2 26 04/10/2011 1345   BUN 8 04/10/2011 1345   CREATININE 0.78 04/10/2011 1345   CREATININE 0.77 11/27/2010 2226      Component Value Date/Time   CALCIUM 9.9 04/12/2011 1601   CALCIUM 11.1* 04/10/2011 1345   ALKPHOS 49 04/10/2011 1345   AST 38* 04/10/2011 1345   ALT 39* 04/10/2011 1345   BILITOT 0.4 04/10/2011 1345       RADIOGRAPHIC STUDIES:  04/15/11  DG SCREENING MAMMOGRAM LEFT  CC and MLO view(s) were taken of the left breast.  Technologist: Juanita A. Souther, Mammography Technologist  LEFT DIGITAL SCREENING MAMMOGRAM WITH CAD:  There are scattered fibroglandular densities. No masses or malignant type calcifications are  identified. Compared with prior studies.  Images were processed with CAD.  IMPRESSION:  No specific mammographic evidence of malignancy. Next screening mammogram is recommended in one  year.  A result letter of this screening mammogram will be mailed directly to the patient.  ASSESSMENT: Negative - BI-RADS 1  Screening mammogram in 1 year.    ASSESSMENT:  1. Right sided breast cancer, ER/PR positive, S/P mastectomy on 10/17/1995, followed by [facility] x 4, followed by Tamoxifen x 5 years, ending as of 04/09/2001. 2. Obesity. 3. GERD 4. IBS   PLAN: 1. Repeat mammogram in one year 2. Return to the clinic in one year. 3. Recommend seeing PCP about hearing loss.   All questions were answered. The patient knows to call the clinic with any problems, questions or concerns. We can certainly see the patient much sooner if  necessary.  The patient and plan discussed with Kimberlee Nearing, MD and he is in agreement with the aforementioned.  I spent 25 minutes counseling the patient face to face. The total time spent in the appointment was 30 minutes.  [name]

## 2011-04-24 NOTE — Patient Instructions (Signed)
[facility]  Discharge Instructions  RECOMMENDATIONS MADE BY THE CONSULTANT AND ANY TEST RESULTS WILL BE SENT TO YOUR REFERRING DOCTOR.   EXAM FINDINGS BY MD TODAY AND SIGNS AND SYMPTOMS TO REPORT TO CLINIC OR PRIMARY MD: You are doing well.      SPECIAL INSTRUCTIONS/FOLLOW-UP: Return to clinic in 1 year.   I acknowledge that I have been informed and understand all the instructions given to me and received a copy. I do not have any more questions at this time, but understand that I may call the Specialty Clinic at [facility] at [phone] during business hours should I have any further questions or need assistance in obtaining follow-up care.    __________________________________________  _____________  __________ Signature of Patient or Authorized Representative            Date                   Time    __________________________________________ Nurse's Signature

## 2011-06-13 LAB — CBC
HCT: 37
Hemoglobin: 11.8 — ABNORMAL LOW
MCHC: 31.9
MCV: 75.7 — ABNORMAL LOW
Platelets: 284
RDW: 17.2 — ABNORMAL HIGH

## 2011-06-13 LAB — COMPREHENSIVE METABOLIC PANEL
Alkaline Phosphatase: 75
BUN: 6
CO2: 29
Chloride: 103
Creatinine, Ser: 0.72
GFR calc non Af Amer: >60
Glucose, Bld: 121 — ABNORMAL HIGH
Potassium: 3.6
Total Bilirubin: 0.5

## 2011-06-18 ENCOUNTER — Other Ambulatory Visit: Payer: Self-pay | Admitting: Family Medicine

## 2011-06-21 LAB — HEMOGLOBIN AND HEMATOCRIT, BLOOD
HCT: 36.7
Hemoglobin: 12

## 2011-06-21 LAB — BASIC METABOLIC PANEL
Calcium: 9.7
GFR calc Af Amer: >60
GFR calc non Af Amer: >60
Glucose, Bld: 154 — ABNORMAL HIGH
Potassium: 3.8
Sodium: 135

## 2011-06-24 LAB — GLUCOSE, CAPILLARY: Glucose-Capillary: 150 — ABNORMAL HIGH

## 2011-06-25 LAB — GLUCOSE, CAPILLARY: Glucose-Capillary: 162 — ABNORMAL HIGH

## 2011-07-08 ENCOUNTER — Encounter: Payer: Self-pay | Admitting: Family Medicine

## 2011-07-26 ENCOUNTER — Encounter: Payer: Self-pay | Admitting: Family Medicine

## 2011-07-30 ENCOUNTER — Encounter: Payer: Self-pay | Admitting: Family Medicine

## 2011-07-31 ENCOUNTER — Ambulatory Visit (INDEPENDENT_AMBULATORY_CARE_PROVIDER_SITE_OTHER): Payer: 59 | Admitting: Family Medicine

## 2011-07-31 ENCOUNTER — Encounter: Payer: Self-pay | Admitting: Family Medicine

## 2011-07-31 VITALS — BP 148/84 | HR 82 | Temp 98.7°F | Resp 16 | Ht 66.5 in | Wt 198.4 lb

## 2011-07-31 DIAGNOSIS — E785 Hyperlipidemia, unspecified: Secondary | ICD-10-CM

## 2011-07-31 DIAGNOSIS — E669 Obesity, unspecified: Secondary | ICD-10-CM

## 2011-07-31 DIAGNOSIS — I10 Essential (primary) hypertension: Secondary | ICD-10-CM

## 2011-07-31 DIAGNOSIS — E119 Type 2 diabetes mellitus without complications: Secondary | ICD-10-CM

## 2011-07-31 DIAGNOSIS — N390 Urinary tract infection, site not specified: Secondary | ICD-10-CM

## 2011-07-31 DIAGNOSIS — R5383 Other fatigue: Secondary | ICD-10-CM

## 2011-07-31 LAB — POCT URINALYSIS DIPSTICK
Protein, UA: 30
Spec Grav, UA: 1.025

## 2011-07-31 MED ORDER — CLONIDINE HCL 0.1 MG PO TABS: ORAL_TABLET | ORAL | Status: DC

## 2011-07-31 MED ORDER — FLUCONAZOLE 150 MG PO TABS: ORAL_TABLET | ORAL | Status: DC

## 2011-07-31 MED ORDER — CIPROFLOXACIN HCL 500 MG PO TABS: 500.0000 mg | ORAL_TABLET | Freq: Two times a day (BID) | ORAL | Status: DC

## 2011-07-31 NOTE — Patient Instructions (Signed)
F/u in 6 weeks.  You appear to have a urinary tract infection, medication is prescribed.  Your blood pressure is high, new additional med at bedtime for this, clonidine half tablet Labs today

## 2011-07-31 NOTE — Progress Notes (Signed)
  Subjective:    Patient ID: [name], female    DOB: 06/12/1946, 65 y.o.   MRN: [id number]  HPI The PT is here for follow up and re-evaluation of chronic medical conditions, medication management and review of any available recent lab and radiology data.  Preventive health is updated, specifically  Cancer screening and Immunization.   Questions or concerns regarding consultations or procedures which the PT has had in the interim are  addressed. The PT denies any adverse reactions to current medications since the last visit.  3 day h/o back pain with mild pressure on urination. States she has been very busy with elections, not drinking enough water, and not voiding as often as she should. Reports blood sugars when tested, are seldom over 110 in the mornings. She denies polyuria, polydipsia, blurred vision or hypoglycemic episodes. She has had her eye exam for this year    Review of Systems See HPI Denies recent fever or chills. Denies sinus pressure, nasal congestion, ear pain or sore throat. Denies chest congestion, productive cough or wheezing. Denies chest pains, palpitations and leg swelling Denies abdominal pain, nausea, vomiting,diarrhea or constipation.   . Denies joint pain, swelling and limitation in mobility. Denies headaches, seizures, numbness, or tingling. Denies depression, anxiety or insomnia. Denies skin break down or rash.        Objective:   Physical Exam Patient alert and oriented and in no cardiopulmonary distress.  HEENT: No facial asymmetry, EOMI, no sinus tenderness,  oropharynx pink and moist.  Neck supple no adenopathy.  Chest: Clear to auscultation bilaterally.  CVS: S1, S2 no murmurs, no S3.  ABD: Soft non tender. Bowel sounds normal.  Ext: No edema  MS: Adequate ROM spine, shoulders, hips and knees.  Skin: Intact, no ulcerations or rash noted.  Psych: Good eye contact, normal affect. Memory intact not anxious or depressed  appearing.  CNS: CN 2-12 intact, power, tone and sensation normal throughout.        Assessment & Plan:

## 2011-08-01 LAB — CBC WITH DIFFERENTIAL/PLATELET
Eosinophils Relative: 1 % (ref 0–5)
Hemoglobin: 11.6 g/dL — ABNORMAL LOW (ref 12.0–15.0)
Lymphocytes Relative: 41 % (ref 12–46)
Lymphs Abs: 3.8 10*3/uL (ref 0.7–4.0)
MCV: 78.9 fL (ref 78.0–100.0)
Monocytes Relative: 7 % (ref 3–12)
Neutrophils Relative %: 50 % (ref 43–77)
Platelets: 245 10*3/uL (ref 150–400)
RBC: 4.55 MIL/uL (ref 3.87–5.11)
WBC: 9.1 10*3/uL (ref 4.0–10.5)

## 2011-08-01 LAB — COMPLETE METABOLIC PANEL WITH GFR
ALT: 27 U/L (ref 0–35)
CO2: 29 mEq/L (ref 19–32)
Calcium: 10.7 mg/dL — ABNORMAL HIGH (ref 8.4–10.5)
Chloride: 104 mEq/L (ref 96–112)
GFR, Est African American: 74 mL/min — ABNORMAL LOW (ref >89)
Sodium: 142 mEq/L (ref 135–145)
Total Protein: 7.3 g/dL (ref 6.0–8.3)

## 2011-08-04 LAB — URINE CULTURE

## 2011-08-17 NOTE — Assessment & Plan Note (Signed)
Not at goal at this visit. Pt to follow low sodium diet , exercise and eat a diet rich ni veg and fruit to assist with blood pressure management, no med change now

## 2011-08-17 NOTE — Assessment & Plan Note (Signed)
Controlled, no change in medication Control has actually improved, and pt is applauded on this

## 2011-08-17 NOTE — Assessment & Plan Note (Signed)
Deteriorated. Patient re-educated about  the importance of commitment to a  minimum of 150 minutes of exercise per week. The importance of healthy food choices with portion control discussed. Encouraged to start a food diary, count calories and to consider  joining a support group. Sample diet sheets offered. Goals set by the patient for the next several months.    

## 2011-08-17 NOTE — Assessment & Plan Note (Signed)
Symptomatic , with abnormal CCUA, will treat and f/u on culture

## 2011-08-17 NOTE — Assessment & Plan Note (Signed)
Uncontrolled, reports intolerance to all classes of   Lipid lowering meds, will depend on diet and lifestyle only

## 2011-09-19 ENCOUNTER — Other Ambulatory Visit: Payer: Self-pay | Admitting: Family Medicine

## 2011-09-23 ENCOUNTER — Telehealth: Payer: Self-pay | Admitting: Family Medicine

## 2011-09-23 ENCOUNTER — Other Ambulatory Visit: Payer: Self-pay | Admitting: Family Medicine

## 2011-09-23 ENCOUNTER — Encounter: Payer: Self-pay | Admitting: Family Medicine

## 2011-09-23 NOTE — Telephone Encounter (Signed)
We do not have anymore flu shots in the office and should be referred to a local pharmacy to receive injection

## 2011-09-25 ENCOUNTER — Other Ambulatory Visit: Payer: Self-pay | Admitting: Family Medicine

## 2011-09-25 DIAGNOSIS — E214 Other specified disorders of parathyroid gland: Secondary | ICD-10-CM

## 2011-09-25 LAB — PTH, INTACT AND CALCIUM: Calcium, Total (PTH): 10.6 mg/dL — ABNORMAL HIGH (ref 8.4–10.5)

## 2011-09-26 ENCOUNTER — Ambulatory Visit: Payer: 59 | Admitting: Family Medicine

## 2011-09-27 ENCOUNTER — Other Ambulatory Visit: Payer: Self-pay | Admitting: Family Medicine

## 2011-09-28 ENCOUNTER — Other Ambulatory Visit: Payer: Self-pay | Admitting: Family Medicine

## 2011-10-03 ENCOUNTER — Ambulatory Visit: Payer: 59 | Admitting: Family Medicine

## 2011-10-03 ENCOUNTER — Encounter: Payer: Self-pay | Admitting: Family Medicine

## 2011-10-07 ENCOUNTER — Ambulatory Visit: Payer: 59 | Admitting: Family Medicine

## 2011-10-21 ENCOUNTER — Other Ambulatory Visit: Payer: Self-pay | Admitting: Family Medicine

## 2011-10-23 ENCOUNTER — Encounter: Payer: Self-pay | Admitting: Family Medicine

## 2011-10-24 ENCOUNTER — Encounter: Payer: Self-pay | Admitting: Family Medicine

## 2011-10-24 ENCOUNTER — Ambulatory Visit (INDEPENDENT_AMBULATORY_CARE_PROVIDER_SITE_OTHER): Payer: 59 | Admitting: Family Medicine

## 2011-10-24 VITALS — BP 124/80 | HR 85 | Resp 16 | Ht 66.5 in | Wt 195.1 lb

## 2011-10-24 DIAGNOSIS — E669 Obesity, unspecified: Secondary | ICD-10-CM

## 2011-10-24 DIAGNOSIS — I10 Essential (primary) hypertension: Secondary | ICD-10-CM

## 2011-10-24 DIAGNOSIS — E785 Hyperlipidemia, unspecified: Secondary | ICD-10-CM

## 2011-10-24 DIAGNOSIS — K589 Irritable bowel syndrome without diarrhea: Secondary | ICD-10-CM

## 2011-10-24 DIAGNOSIS — L909 Atrophic disorder of skin, unspecified: Secondary | ICD-10-CM

## 2011-10-24 DIAGNOSIS — B009 Herpesviral infection, unspecified: Secondary | ICD-10-CM

## 2011-10-24 DIAGNOSIS — L919 Hypertrophic disorder of the skin, unspecified: Secondary | ICD-10-CM

## 2011-10-24 DIAGNOSIS — E119 Type 2 diabetes mellitus without complications: Secondary | ICD-10-CM

## 2011-10-24 DIAGNOSIS — K219 Gastro-esophageal reflux disease without esophagitis: Secondary | ICD-10-CM

## 2011-10-24 NOTE — Patient Instructions (Signed)
F/U in mid March  HBA1C and chem 7 in March before visit  It is important that you exercise regularly at least 30 minutes 5 times a week. If you develop chest pain, have severe difficulty breathing, or feel very tired, stop exercising immediately and seek medical attention    A healthy diet is rich in fruit, vegetables and whole grains. Poultry fish, nuts and beans are a healthy choice for protein rather then red meat. A low sodium diet and drinking 64 ounces of water daily is generally recommended. Oils and sweet should be limited. Carbohydrates especially for those who are diabetic or overweight, should be limited to 30-45 gram per meal. It is important to eat on a regular schedule, at least 3 times daily. Snacks should be primarily fruits, vegetables or nuts.  I will continue to follow up about the parathyroid problems, remember , information is being gathered before any decision about the need for surgery is made

## 2011-10-24 NOTE — Assessment & Plan Note (Signed)
Currently controlled.

## 2011-10-24 NOTE — Assessment & Plan Note (Signed)
Controlled, no change in medication  

## 2011-10-27 NOTE — Assessment & Plan Note (Signed)
Adequately controlled, pt expresses concern about developing esophageal ca like her brother did , she is also followed by GI

## 2011-10-27 NOTE — Progress Notes (Signed)
  Subjective:    Patient ID: [name], female    DOB: 18-Nov-1945, 66 y.o.   MRN: [id number]  HPI The PT is here for follow up and re-evaluation of chronic medical conditions, medication management and review of any available recent lab and radiology data.  Preventive health is updated, specifically  Cancer screening and Immunization.   Questions or concerns regarding consultations or procedures which the PT has had in the interim are  Addressed.A significant amt of time was spent discussing recent endo eval for hypercalcemia The PT denies any adverse reactions to current medications since the last visit.  Recent flare of oral herpes, not as good a response to cream as desired. Has concerns over work up for parathyroid disease also voices concern that her  GI probs are not related to esophageal cancer which her brother had      Review of Systems See HPI Denies recent fever or chills. Denies sinus pressure, nasal congestion, ear pain or sore throat. Denies chest congestion, productive cough or wheezing. Denies chest pains, palpitations and leg swelling Denies abdominal pain, nausea, vomiting,diarrhea or constipation.   Denies dysuria, frequency, hesitancy or incontinence. Denies joint pain, swelling and limitation in mobility. Denies headaches, seizures, numbness, or tingling.  Denies skin break down or rash.        Objective:   Physical Exam Patient alert and oriented and in no cardiopulmonary distress.  HEENT: No facial asymmetry, EOMI, no sinus tenderness,  oropharynx pink and moist.  Neck supple no adenopathy.  Chest: Clear to auscultation bilaterally.  CVS: S1, S2 no murmurs, no S3.  ABD: Soft non tender. Bowel sounds normal.  Ext: No edema  MS: Adequate ROM spine, shoulders, hips and knees.  Skin: Intact, no ulcerations or rash noted.  Psych: Good eye contact, normal affect. Memory intact not anxious or depressed appearing.  CNS: CN 2-12 intact, power,  tone and sensation normal throughout.        Assessment & Plan:

## 2011-10-27 NOTE — Assessment & Plan Note (Signed)
Continues to have severe diarreah predominancewhich is disabling at times

## 2011-10-27 NOTE — Assessment & Plan Note (Signed)
Currently being evalluated by endocrine, I reviewed her work up to date, and reassured her that there was no definite need for surgery at this time base on current info available the work up is in progress

## 2011-10-27 NOTE — Assessment & Plan Note (Signed)
Improved. Pt applauded on succesful weight loss through lifestyle change, and encouraged to continue same. Weight loss goal set for the next several months.  

## 2011-10-27 NOTE — Assessment & Plan Note (Signed)
Uncontrolled on low fat diet only, however reports statin intolerance

## 2011-10-27 NOTE — Assessment & Plan Note (Signed)
Had recent outbreak, cram not as effective tabs will be prescribed for her to have on hand should she need them

## 2011-10-31 ENCOUNTER — Other Ambulatory Visit: Payer: Self-pay

## 2011-10-31 DIAGNOSIS — B009 Herpesviral infection, unspecified: Secondary | ICD-10-CM

## 2011-10-31 MED ORDER — ACYCLOVIR 400 MG PO TABS: 400.0000 mg | ORAL_TABLET | Freq: Every day | ORAL | Status: DC

## 2011-11-01 ENCOUNTER — Other Ambulatory Visit: Payer: Self-pay | Admitting: Family Medicine

## 2011-11-01 NOTE — Progress Notes (Signed)
  Subjective:    Patient ID: [name], female    DOB: 04/25/1946, 66 y.o.   MRN: [id number]  HPI    Review of Systems     Objective:   Physical Exam        Assessment & Plan:  Herpes labialis is not an active problem for this pt and may not truly belong on her problem list, I called back to discuss this with her, and will remove it from her prob list, unless history change. States she has had no recent outbreak,just has dry cracks in angles of her mouth

## 2011-12-07 LAB — BASIC METABOLIC PANEL
BUN: 9 mg/dL (ref 6–23)
Potassium: 4 mEq/L (ref 3.5–5.3)
Sodium: 140 mEq/L (ref 135–145)

## 2011-12-07 LAB — HEMOGLOBIN A1C
Hgb A1c MFr Bld: 6.3 % — ABNORMAL HIGH (ref <5.7)
Mean Plasma Glucose: 134 mg/dL — ABNORMAL HIGH (ref <117)

## 2011-12-10 ENCOUNTER — Other Ambulatory Visit (INDEPENDENT_AMBULATORY_CARE_PROVIDER_SITE_OTHER): Payer: Self-pay | Admitting: Otolaryngology

## 2011-12-10 DIAGNOSIS — E210 Primary hyperparathyroidism: Secondary | ICD-10-CM

## 2011-12-11 ENCOUNTER — Ambulatory Visit (INDEPENDENT_AMBULATORY_CARE_PROVIDER_SITE_OTHER): Payer: 59 | Admitting: Family Medicine

## 2011-12-11 ENCOUNTER — Encounter: Payer: Self-pay | Admitting: Family Medicine

## 2011-12-11 VITALS — BP 142/76 | HR 83 | Resp 18 | Ht 66.5 in | Wt 198.0 lb

## 2011-12-11 DIAGNOSIS — E119 Type 2 diabetes mellitus without complications: Secondary | ICD-10-CM

## 2011-12-11 DIAGNOSIS — Z01818 Encounter for other preprocedural examination: Secondary | ICD-10-CM

## 2011-12-11 DIAGNOSIS — Z23 Encounter for immunization: Secondary | ICD-10-CM

## 2011-12-11 DIAGNOSIS — I10 Essential (primary) hypertension: Secondary | ICD-10-CM

## 2011-12-11 DIAGNOSIS — E785 Hyperlipidemia, unspecified: Secondary | ICD-10-CM

## 2011-12-11 DIAGNOSIS — R0789 Other chest pain: Secondary | ICD-10-CM | POA: Insufficient documentation

## 2011-12-11 NOTE — Assessment & Plan Note (Signed)
Sub optimal control, no med change, h/o intolerance of medication

## 2011-12-11 NOTE — Progress Notes (Signed)
  Subjective:    Patient ID: [name], female    DOB: 01-16-1946, 66 y.o.   MRN: [id number]  HPI The PT is here for follow up and re-evaluation of chronic medical conditions, medication management and review of any available recent lab and radiology data.  Preventive health is updated, specifically  Cancer screening and Immunization.   Questions or concerns regarding consultations or procedures which the PT has had in the interim are  addressed. The PT denies any adverse reactions to current medications since the last visit.  Pt has upcoming parathyroid surgery, she is comfortable with ENT surgeon, and has had  her questions regarding the procedure adequately answered. Denies polyuria, polydipsia , blurred vision or hypoglycemic episodes. Fasting blood sugars are seldom over 130. Intermittent chest discomfort, non radiating, not associated with activity, no accompanying symptoms, sufficient cv risk factors to warrant re eval prior to surgery by cardiology     Review of Systems See HPI Denies recent fever or chills. Denies sinus pressure, nasal congestion, ear pain or sore throat. Denies chest congestion, productive cough or wheezing. Denies  palpitations and leg swelling Intermittent  abdominal pain, does have GERD, nausea, vomiting,diarrhea or constipation.   Denies dysuria, frequency, hesitancy or incontinence. Denies joint pain, swelling and limitation in mobility. Denies headaches, seizures, numbness, or tingling. Denies depression, anxiety or insomnia. Denies skin break down or rash.         Objective:   Physical Exam Patient alert and oriented and in no cardiopulmonary distress.  HEENT: No facial asymmetry, EOMI, no sinus tenderness,  oropharynx pink and moist.  Neck supple no adenopathy.  Chest: Clear to auscultation bilaterally.  CVS: S1, S2 no murmurs, no S3.  ABD: Soft non tender. Bowel sounds normal.  Ext: No edema  MS: Adequate ROM spine, shoulders,  hips and knees.  Skin: Intact, no ulcerations or rash noted.  Psych: Good eye contact, normal affect. Memory intact not anxious or depressed appearing.  CNS: CN 2-12 intact, power, tone and sensation normal throughout.        Assessment & Plan:

## 2011-12-11 NOTE — Patient Instructions (Signed)
F/u in 4 month.  TdaP today, and use antibiotic ointment to cut ion left foot where you got a splinter yesterday. Call if gets red or tender.  You are referred for cardiology evaluation  Fasting lipid, hepatic and CBC as soon as possible , the cardiologist will need this  HBa1C in 4 month  All the best with your surgery

## 2011-12-11 NOTE — Assessment & Plan Note (Signed)
Intermittent chest discomfort for years, know GERD , no known cAD but multiple risk factors, will refer for card eval prior to surgery

## 2011-12-11 NOTE — Assessment & Plan Note (Signed)
Controlled, no change in medication  

## 2011-12-11 NOTE — Assessment & Plan Note (Signed)
Uncontrolled intolerant to statins, will consider re trial after upcoming surgery,. Will try trilipix then

## 2011-12-17 ENCOUNTER — Encounter (HOSPITAL_COMMUNITY)
Admission: RE | Admit: 2011-12-17 | Discharge: 2011-12-17 | Disposition: A | Discharge status: Home / Self Care | Payer: 59 | Source: Ambulatory Visit | Attending: Otolaryngology | Admitting: Otolaryngology

## 2011-12-17 DIAGNOSIS — E210 Primary hyperparathyroidism: Secondary | ICD-10-CM

## 2011-12-17 DIAGNOSIS — E213 Hyperparathyroidism, unspecified: Secondary | ICD-10-CM | POA: Insufficient documentation

## 2011-12-17 MED ORDER — TECHNETIUM TC 99M SESTAMIBI - CARDIOLITE
25.0000 | Freq: Once | INTRAVENOUS | Status: AC | PRN
  Administered 2011-12-17: 10:00:00 25 via INTRAVENOUS

## 2011-12-18 ENCOUNTER — Ambulatory Visit (INDEPENDENT_AMBULATORY_CARE_PROVIDER_SITE_OTHER): Payer: 59 | Admitting: Physician Assistant

## 2011-12-18 ENCOUNTER — Encounter: Payer: Self-pay | Admitting: Physician Assistant

## 2011-12-18 VITALS — BP 130/72 | HR 75 | Resp 18 | Ht 66.0 in | Wt 198.0 lb

## 2011-12-18 DIAGNOSIS — R0602 Shortness of breath: Secondary | ICD-10-CM

## 2011-12-18 DIAGNOSIS — E785 Hyperlipidemia, unspecified: Secondary | ICD-10-CM

## 2011-12-18 DIAGNOSIS — E213 Hyperparathyroidism, unspecified: Secondary | ICD-10-CM

## 2011-12-18 DIAGNOSIS — R0789 Other chest pain: Secondary | ICD-10-CM

## 2011-12-18 DIAGNOSIS — I10 Essential (primary) hypertension: Secondary | ICD-10-CM

## 2011-12-18 NOTE — Progress Notes (Signed)
[address] [place], [place]  [address] Phone: [phone] Fax:  [phone]  Date:  12/18/2011   Name:  JANINE RELLER       DOB:  [birth year] MRN:  [id number]  PCP:  Dr. Syliva Overman Primary Cardiologist:  Dr. Dietrich Pates  Primary Electrophysiologist:  None    History of Present Illness: [name] is a 66 y.o. female who presents for surgical clearance.  She has a history of dyspnea, hypertension, hyperlipidemia, diabetes, breast cancer, IBS.  She is referred back by her primary care doctor for surgical clearance.  She has hyperparathyroidism and needs parathyroidectomy.  She was last seen by Dr. Tenny Craw in 3/11.  Plan was for 6 month followup.  Echocardiogram 07/2008: EF 55-65%, mild LVH, mild MAC, mild MR, mild LAE.  Nuclear study 08/2008 negative for scar or ischemia (target heart rate not reached).  She notes occasional chest discomfort.  She describes it as tightness.  She is not really clear if it occurs with exertion or not.  She has significant problems with acid reflux.  She sleeps on an incline because of this.  She has significant dyspnea with exertion.  This has been a chronic problem.  She had back surgery in 2011.  She has not been as active since that time.  She does awaken with a cough.  She denies true PND.  She denies edema.  She denies syncope or near-syncope.  She sometimes gets lightheaded with standing quickly.  Is not certain that she can achieve 4 METs.  Past Medical History  Diagnosis Date  . GERD (gastroesophageal reflux disease)     mann  . Insomnia   . Anxiety disorder   . Hyperlipidemia   . Hypertension   . Obesity   . Diabetes mellitus type 1   . DJD (degenerative joint disease) of lumbar spine   . Degenerative disc disease, lumbar     pressing on L3 and L4  . Adenocarcinoma of breast 1987    right / chemo + tamoxifen x 5 years   . IBS (irritable bowel syndrome)   . Hypersensitivity     in tongue   Past  Surgical History  Procedure Date  . Right mastectomy 1997  . Cesarean section     3  . Urological surgery for blocked ureter secondary to kidney stone   . Total abdominal hysterectomy w/ bilateral salpingoophorectomy   . Appendectomy   . Cataract surgery right eye 04/21/2008  . Bilateral eye surgery 11/2008  . Implant and screws put in the right lower jaw 11/2008    dental surgery  . Spine surgery 2011  . Eye surgery   . Abdominal hysterectomy   . Breast surgery   . Ovarian cyst surgery   . Kidney blockage     ? major surgery     Current Outpatient Prescriptions  Medication Sig Dispense Refill  . BYSTOLIC 10 MG tablet TAKE 2 TABLETS EACH MORNING AND 2 TABLETS AT BEDTIME.  120 each  3  . cloNIDine (CATAPRES) 0.1 MG tablet One half tablet once daily at bedtime  15 tablet  11  . GLUCOPHAGE 1000 MG tablet TAKE 1 TABLET BY MOUTH TWICE DAILY WITH FOOD FOR DIABETES.  60 each  3  . glucose blood test strip 1 each by Other route daily. Use as instructed       . Iron-Vitamins (GERITOL COMPLETE) TABS Take by mouth daily.        . lansoprazole (  PREVACID) 30 MG capsule Take 1 capsule (30 mg total) by mouth daily.  30 capsule  3  . Multiple Vitamin (MULITIVITAMIN WITH MINERALS) TABS Take 1 tablet by mouth daily.      . ONE TOUCH LANCETS MISC by Does not apply route.          Allergies: Allergies  Allergen Reactions  . Ace Inhibitors   . Statins     REACTION: myalgia    History  Substance Use Topics  . Smoking status: Never Smoker   . Smokeless tobacco: Not on file  . Alcohol Use: No     Family History  Problem Relation Age of Onset  . Heart attack Mother   . Cancer Brother     Esophageal      ROS:  Please see the history of present illness.   She has chronic diarrhea.  All other systems reviewed and negative.   PHYSICAL EXAM: VS:  BP 130/72  Pulse 75  Resp 18  Ht 5\' 6"  (1.676 m)  Wt 198 lb (89.812 kg)  BMI 31.96 kg/m2 Well nourished, well developed, in no acute  distress HEENT: normal Neck: no JVD Vascular: No carotid bruits Cardiac:  normal S1, S2; RRR; no murmur Lungs:  clear to auscultation bilaterally, no wheezing, rhonchi or rales Abd: soft, nontender, no hepatomegaly Ext: no edema Skin: warm and dry Neuro:  CNs 2-12 intact, no focal abnormalities noted Psych: Normal affect  EKG:  Sinus rhythm, heart rate 75, normal axis, T wave inversions in leads 3, aVF, V3-V6, no change when compared to prior tracings.  ASSESSMENT AND PLAN:  1. Chest pain, atypical  She certainly has significant cardiac risk factors.  Given the fact that she has surgery pending for her hyperparathyroidism, I have recommended proceeding with stress testing.  Arrange ETT-Myoview.  If this is low risk, no further workup.  If she has significant ischemia, she will likely need cardiac catheterization.  Otherwise, followup with Dr. Tenny Craw in 2-3 months.   2. DYSPNEA  This is a chronic problem.  It is likely worsened by her deconditioning after her back surgery.  No signs of volume overload on exam.  LV function has been normal in the past.   3. HYPERTENSION  Controlled.  Continue current therapy.    4. HYPERLIPIDEMIA  Managed by PCP.  She has been intolerant of statins in the past.   5. Hyperparathyroidism  Parathyroidectomy pending.  As noted above, proceed with stress testing prior to cardiac clearance.    Signed, Tereso Newcomer, PA-C  4:19 PM 12/18/2011

## 2011-12-18 NOTE — Patient Instructions (Addendum)
Your physician has requested that you have en exercise stress myoview DX [phone]. For further information please visit [email or web address] Please follow instruction sheet, as given.   Your physician recommends that you schedule a follow-up appointment in: 2-3 MONTHS WITH DR. Tenny Craw

## 2011-12-25 ENCOUNTER — Ambulatory Visit (HOSPITAL_COMMUNITY): Payer: 59 | Attending: Cardiology | Admitting: Radiology

## 2011-12-25 VITALS — BP 141/86 | Ht 66.0 in | Wt 196.0 lb

## 2011-12-25 DIAGNOSIS — I10 Essential (primary) hypertension: Secondary | ICD-10-CM | POA: Insufficient documentation

## 2011-12-25 DIAGNOSIS — R0602 Shortness of breath: Secondary | ICD-10-CM

## 2011-12-25 DIAGNOSIS — Z8249 Family history of ischemic heart disease and other diseases of the circulatory system: Secondary | ICD-10-CM | POA: Insufficient documentation

## 2011-12-25 DIAGNOSIS — R0789 Other chest pain: Secondary | ICD-10-CM

## 2011-12-25 DIAGNOSIS — Z01810 Encounter for preprocedural cardiovascular examination: Secondary | ICD-10-CM | POA: Insufficient documentation

## 2011-12-25 DIAGNOSIS — R9431 Abnormal electrocardiogram [ECG] [EKG]: Secondary | ICD-10-CM | POA: Insufficient documentation

## 2011-12-25 DIAGNOSIS — R079 Chest pain, unspecified: Secondary | ICD-10-CM

## 2011-12-25 DIAGNOSIS — R0989 Other specified symptoms and signs involving the circulatory and respiratory systems: Secondary | ICD-10-CM | POA: Insufficient documentation

## 2011-12-25 DIAGNOSIS — E119 Type 2 diabetes mellitus without complications: Secondary | ICD-10-CM | POA: Insufficient documentation

## 2011-12-25 DIAGNOSIS — R42 Dizziness and giddiness: Secondary | ICD-10-CM | POA: Insufficient documentation

## 2011-12-25 DIAGNOSIS — R0609 Other forms of dyspnea: Secondary | ICD-10-CM | POA: Insufficient documentation

## 2011-12-25 DIAGNOSIS — E785 Hyperlipidemia, unspecified: Secondary | ICD-10-CM | POA: Insufficient documentation

## 2011-12-25 MED ORDER — TECHNETIUM TC 99M TETROFOSMIN IV KIT
33.0000 | PACK | Freq: Once | INTRAVENOUS | Status: AC | PRN
  Administered 2011-12-25: 33 via INTRAVENOUS

## 2011-12-25 MED ORDER — TECHNETIUM TC 99M TETROFOSMIN IV KIT
11.0000 | PACK | Freq: Once | INTRAVENOUS | Status: AC | PRN
  Administered 2011-12-25: 11 via INTRAVENOUS

## 2011-12-25 MED ORDER — REGADENOSON 0.4 MG/5ML IV SOLN
0.4000 mg | Freq: Once | INTRAVENOUS | Status: AC
  Administered 2011-12-25: 0.4 mg via INTRAVENOUS

## 2011-12-25 NOTE — Progress Notes (Signed)
[facility] SITE 3 NUCLEAR MED [address] [place] [address] [phone]  Cardiology Nuclear Med Study  [name] is a 66 y.o. female     MRN : [id number]     DOB: [birth year]  Procedure Date: 12/25/2011  Nuclear Med Background Indication for Stress Test:  Evaluation for Ischemia and Pending Surgical Clearance: Parathyroidectomy- Dr. Suszanne Conners History: Abn. EKG 11/09 ECHO: EF: 55-65%, mild LVH, 09/02/08 Cardiac Risk Factors: Family History - CAD, Hypertension, Lipids and NIDDM  Symptoms:  Chest Tightness, DOE and Light-Headedness   Nuclear Pre-Procedure Caffeine/Decaff Intake:  None NPO After: 10:30pm   Lungs:  clear O2 Sat: 96% on room air. IV 0.9% NS with Angio Cath:  22g  IV Site: L Wrist  IV Started by:  Stanton Kidney, EMT-P  Chest Size (in):  42 Cup Size: B  Height: 5\' 6"  (1.676 m)  Weight:  196 lb (88.905 kg)  BMI:  Body mass index is 31.64 kg/(m^2). Tech Comments:  Bystolic held > 24 hours, per patient. The last 2 times the patient has been scheduled for this test she hasn't been able to finish the treadmill, and a pharmacological agent may need to be used next time.    Nuclear Med Study 1 or 2 day study: 1 day  Stress Test Type:  Treadmill/Lexiscan  Reading MD: Marca Ancona, MD  Order Authorizing Provider:  P.Ross MD Tereso Newcomer PA  Resting Radionuclide: Technetium 42m Tetrofosmin  Resting Radionuclide Dose: 11.0 mCi   Stress Radionuclide:  Technetium 39m Tetrofosmin  Stress Radionuclide Dose: 32.9 mCi           Stress Protocol Rest HR: 70 Stress HR: 121  Rest BP: 141/86 Stress BP: 164/81  Exercise Time (min): 7:28 METS: 7.0   Predicted Max HR: 156 bpm % Max HR: 77.56 bpm Rate Pressure Product: 95284   Dose of Adenosine (mg):  n/a Dose of Lexiscan: 0.4 mg  Dose of Atropine (mg): n/a Dose of Dobutamine: n/a mcg/kg/min (at max HR)  Stress Test Technologist: Milana Na, EMT-P  Nuclear Technologist:  Domenic Polite, CNMT     Rest  Procedure:  Myocardial perfusion imaging was performed at rest 45 minutes following the intravenous administration of Technetium 31m Tetrofosmin. Rest ECG: NSR with non-specific ST-T wave changes  Stress Procedure:  This patient originally started to walk on the treadmill and was unable to keep up with it and was changed to a walking Lexiscan. The patient received IV Lexiscan 0.4 mg over 15-seconds with concurrent low level exercise and then Technetium 33m Tetrofosmin was injected at 30-seconds while the patient continued walking one more minute. There were no significant changes, lt. Headed, chest burning, and rare pvcs with Lexiscan. Quantitative spect images were obtained after a 45-minute delay. Stress ECG: No significant change from baseline ECG  QPS Raw Data Images:  Normal; no motion artifact; normal heart/lung ratio. Stress Images:  Normal homogeneous uptake in all areas of the myocardium. Rest Images:  Normal homogeneous uptake in all areas of the myocardium. Subtraction (SDS):  There is no evidence of scar or ischemia. Transient Ischemic Dilatation (Normal <1.22):  0.95 Lung/Heart Ratio (Normal <0.45):  0.35  Quantitative Gated Spect Images QGS EDV:  72 ml QGS ESV:  23 ml  Impression Exercise Capacity:  Lexiscan with low level exercise.  BP Response:  Normal blood pressure response. Clinical Symptoms:  Chest burning, lightheaded.  ECG Impression:  Anterior T wave inversions, no changes Comparison with Prior Nuclear Study: No significant  change from previous study  Marca Ancona   Overall Impression:  Normal stress nuclear study.  LV Ejection Fraction: 67%.  LV Wall Motion:  NL LV Function; NL Wall Motion

## 2011-12-26 ENCOUNTER — Telehealth: Payer: Self-pay | Admitting: *Deleted

## 2011-12-26 NOTE — Telephone Encounter (Signed)
Message copied by Tarri Fuller on Thu Dec 26, 2011 11:25 AM ------      Message from: [place], [place] T      Created: Thu Dec 26, 2011 11:24 AM       Please inform patient stress test normal.      She may proceed with parathyroid surgery without further workup and should be at acceptable risk.      Tereso Newcomer, PA-C  11:23 AM 12/26/2011

## 2011-12-26 NOTE — Telephone Encounter (Signed)
pt notified of stress test normal and ok to proceed with thyroid surgery. Danielle Rankin

## 2012-01-20 ENCOUNTER — Other Ambulatory Visit: Payer: Self-pay | Admitting: Otolaryngology

## 2012-01-24 ENCOUNTER — Other Ambulatory Visit: Payer: Self-pay | Admitting: Family Medicine

## 2012-02-07 ENCOUNTER — Encounter (HOSPITAL_COMMUNITY): Payer: Self-pay | Admitting: Pharmacy Technician

## 2012-02-10 ENCOUNTER — Inpatient Hospital Stay (HOSPITAL_COMMUNITY): Admission: RE | Admit: 2012-02-10 | Discharge: 2012-02-10 | Payer: 59 | Source: Ambulatory Visit

## 2012-02-10 ENCOUNTER — Encounter (HOSPITAL_COMMUNITY): Payer: Self-pay

## 2012-02-10 HISTORY — DX: Encounter for other specified aftercare: Z51.89

## 2012-02-10 HISTORY — DX: Personal history of urinary (tract) infections: Z87.440

## 2012-02-10 HISTORY — DX: Nausea with vomiting, unspecified: Z98.890

## 2012-02-10 HISTORY — DX: Calculus of kidney: N20.0

## 2012-02-10 HISTORY — DX: Nausea with vomiting, unspecified: R11.2

## 2012-02-10 HISTORY — DX: Other complications of anesthesia, initial encounter: T88.59XA

## 2012-02-10 HISTORY — DX: Adverse effect of unspecified anesthetic, initial encounter: T41.45XA

## 2012-02-18 NOTE — Pre-Procedure Instructions (Addendum)
20 [name]  02/18/2012   Your procedure is scheduled on:  Wednesday, June 5th  Report to [facility] at 0745 AM.  Call this number if you have problems the morning of surgery: [phone]   Remember:   Do not eat food:After Midnight.  May have clear liquids: up to 4 Hours before arrival.  Clear liquids include soda, tea, black coffee, apple or grape juice, broth.  Take these medicines the morning of surgery with A SIP OF WATER: *Lansoprazole,Bytstolic**   Do not wear jewelry, make-up or nail polish.  Do not wear lotions, powders, or perfumes. You may wear deodorant.  Do not shave 48 hours prior to surgery. Men may shave face and neck.  Do not bring valuables to the hospital.  Contacts, dentures or bridgework may not be worn into surgery.  Leave suitcase in the car. After surgery it may be brought to your room.  For patients admitted to the hospital, checkout time is 11:00 AM the day of discharge.   Patients discharged the day of surgery will not be allowed to drive home.  Name and phone number of your driver: *n/a**  Special Instructions: CHG Shower Use Special Wash: 1/2 bottle night before surgery and 1/2 bottle morning of surgery.   Please read over the following fact sheets that you were given: Pain Booklet, Coughing and Deep Breathing, MRSA Information and Surgical Site Infection Prevention

## 2012-02-19 ENCOUNTER — Encounter (HOSPITAL_COMMUNITY)
Admission: RE | Admit: 2012-02-19 | Discharge: 2012-02-19 | Disposition: A | Discharge status: Home / Self Care | Payer: Medicare Other | Source: Ambulatory Visit | Attending: Otolaryngology | Admitting: Otolaryngology

## 2012-02-19 LAB — CBC
HCT: 35.2 % — ABNORMAL LOW (ref 36.0–46.0)
Hemoglobin: 11.3 g/dL — ABNORMAL LOW (ref 12.0–15.0)
MCH: 25.1 pg — ABNORMAL LOW (ref 26.0–34.0)
MCHC: 32.1 g/dL (ref 30.0–36.0)
MCV: 78.2 fL (ref 78.0–100.0)
RBC: 4.5 MIL/uL (ref 3.87–5.11)

## 2012-02-19 LAB — BASIC METABOLIC PANEL
BUN: 10 mg/dL (ref 6–23)
CO2: 28 mEq/L (ref 19–32)
Glucose, Bld: 119 mg/dL — ABNORMAL HIGH (ref 70–99)
Potassium: 3.5 mEq/L (ref 3.5–5.1)
Sodium: 140 mEq/L (ref 135–145)

## 2012-02-26 ENCOUNTER — Encounter (HOSPITAL_COMMUNITY): Payer: Self-pay | Admitting: Anesthesiology

## 2012-02-26 ENCOUNTER — Encounter (HOSPITAL_COMMUNITY)
Admission: RE | Disposition: A | Discharge status: Home / Self Care | Payer: Self-pay | Source: Ambulatory Visit | Attending: Otolaryngology

## 2012-02-26 ENCOUNTER — Ambulatory Visit (HOSPITAL_COMMUNITY)
Admission: RE | Admit: 2012-02-26 | Discharge: 2012-02-27 | Disposition: A | Discharge status: Home / Self Care | Payer: Medicare Other | Source: Ambulatory Visit | Attending: Otolaryngology | Admitting: Otolaryngology

## 2012-02-26 ENCOUNTER — Ambulatory Visit (HOSPITAL_COMMUNITY): Payer: Medicare Other | Admitting: Anesthesiology

## 2012-02-26 ENCOUNTER — Encounter (HOSPITAL_COMMUNITY): Payer: Self-pay | Admitting: *Deleted

## 2012-02-26 DIAGNOSIS — E210 Primary hyperparathyroidism: Secondary | ICD-10-CM | POA: Insufficient documentation

## 2012-02-26 DIAGNOSIS — R0602 Shortness of breath: Secondary | ICD-10-CM | POA: Insufficient documentation

## 2012-02-26 DIAGNOSIS — E119 Type 2 diabetes mellitus without complications: Secondary | ICD-10-CM | POA: Insufficient documentation

## 2012-02-26 DIAGNOSIS — K219 Gastro-esophageal reflux disease without esophagitis: Secondary | ICD-10-CM | POA: Insufficient documentation

## 2012-02-26 DIAGNOSIS — I10 Essential (primary) hypertension: Secondary | ICD-10-CM | POA: Insufficient documentation

## 2012-02-26 DIAGNOSIS — D351 Benign neoplasm of parathyroid gland: Secondary | ICD-10-CM | POA: Insufficient documentation

## 2012-02-26 DIAGNOSIS — K469 Unspecified abdominal hernia without obstruction or gangrene: Secondary | ICD-10-CM

## 2012-02-26 DIAGNOSIS — Z9889 Other specified postprocedural states: Secondary | ICD-10-CM

## 2012-02-26 DIAGNOSIS — Z01812 Encounter for preprocedural laboratory examination: Secondary | ICD-10-CM | POA: Insufficient documentation

## 2012-02-26 DIAGNOSIS — Z01818 Encounter for other preprocedural examination: Secondary | ICD-10-CM | POA: Insufficient documentation

## 2012-02-26 HISTORY — DX: Cardiac murmur, unspecified: R01.1

## 2012-02-26 HISTORY — PX: PARATHYROIDECTOMY: SHX19

## 2012-02-26 HISTORY — DX: Other forms of dyspnea: R06.09

## 2012-02-26 HISTORY — DX: Shortness of breath: R06.02

## 2012-02-26 HISTORY — DX: Personal history of peptic ulcer disease: Z87.11

## 2012-02-26 HISTORY — DX: Cardiac arrhythmia, unspecified: I49.9

## 2012-02-26 HISTORY — DX: Personal history of other diseases of the digestive system: Z87.19

## 2012-02-26 HISTORY — DX: Anemia, unspecified: D64.9

## 2012-02-26 HISTORY — DX: Dyspnea, unspecified: R06.00

## 2012-02-26 HISTORY — DX: Unspecified abdominal hernia without obstruction or gangrene: K46.9

## 2012-02-26 LAB — GLUCOSE, CAPILLARY
Glucose-Capillary: 109 mg/dL — ABNORMAL HIGH (ref 70–99)
Glucose-Capillary: 121 mg/dL — ABNORMAL HIGH (ref 70–99)
Glucose-Capillary: 131 mg/dL — ABNORMAL HIGH (ref 70–99)

## 2012-02-26 SURGERY — PARATHYROIDECTOMY
Anesthesia: General | Site: Neck | Wound class: Clean

## 2012-02-26 MED ORDER — CEFAZOLIN SODIUM 1-5 GM-% IV SOLN
INTRAVENOUS | Status: DC | PRN
  Administered 2012-02-26: 2 g via INTRAVENOUS

## 2012-02-26 MED ORDER — SUCCINYLCHOLINE CHLORIDE 20 MG/ML IJ SOLN
INTRAMUSCULAR | Status: DC | PRN
  Administered 2012-02-26: 120 mg via INTRAVENOUS

## 2012-02-26 MED ORDER — PANTOPRAZOLE SODIUM 20 MG PO TBEC
20.0000 mg | DELAYED_RELEASE_TABLET | Freq: Every day | ORAL | Status: DC
  Filled 2012-02-26: qty 1

## 2012-02-26 MED ORDER — LIDOCAINE-EPINEPHRINE 1 %-1:100000 IJ SOLN
INTRAMUSCULAR | Status: DC | PRN
  Administered 2012-02-26: 5 mL via INTRADERMAL

## 2012-02-26 MED ORDER — PHENOL 1.4 % MT LIQD
1.0000 | OROMUCOSAL | Status: DC | PRN
  Filled 2012-02-26: qty 177

## 2012-02-26 MED ORDER — METFORMIN HCL 500 MG PO TABS
1000.0000 mg | ORAL_TABLET | Freq: Two times a day (BID) | ORAL | Status: DC
  Administered 2012-02-26 – 2012-02-27 (×2): 1000 mg via ORAL
  Filled 2012-02-26 (×4): qty 2

## 2012-02-26 MED ORDER — MIDAZOLAM HCL 5 MG/5ML IJ SOLN
INTRAMUSCULAR | Status: DC | PRN
  Administered 2012-02-26: 1 mg via INTRAVENOUS

## 2012-02-26 MED ORDER — CEPHALEXIN 250 MG/5ML PO SUSR
500.0000 mg | Freq: Two times a day (BID) | ORAL | Status: DC
  Administered 2012-02-26 – 2012-02-27 (×2): 500 mg via ORAL
  Filled 2012-02-26 (×3): qty 10

## 2012-02-26 MED ORDER — INSULIN ASPART 100 UNIT/ML ~~LOC~~ SOLN
0.0000 [IU] | Freq: Three times a day (TID) | SUBCUTANEOUS | Status: DC
  Administered 2012-02-26: 1 [IU] via SUBCUTANEOUS

## 2012-02-26 MED ORDER — LIDOCAINE HCL 4 % MT SOLN
OROMUCOSAL | Status: DC | PRN
  Administered 2012-02-26: 4 mL via TOPICAL

## 2012-02-26 MED ORDER — LACTATED RINGERS IV SOLN
INTRAVENOUS | Status: DC | PRN
  Administered 2012-02-26 (×2): via INTRAVENOUS

## 2012-02-26 MED ORDER — MORPHINE SULFATE 2 MG/ML IJ SOLN
2.0000 mg | INTRAMUSCULAR | Status: DC | PRN
  Administered 2012-02-26: 4 mg via INTRAVENOUS
  Filled 2012-02-26: qty 2

## 2012-02-26 MED ORDER — OXYCODONE-ACETAMINOPHEN 5-325 MG PO TABS
1.0000 | ORAL_TABLET | ORAL | Status: DC | PRN
  Administered 2012-02-26 – 2012-02-27 (×3): 2 via ORAL
  Filled 2012-02-26 (×3): qty 2

## 2012-02-26 MED ORDER — ZOLPIDEM TARTRATE 5 MG PO TABS: 5.0000 mg | ORAL_TABLET | Freq: Every evening | ORAL | Status: DC | PRN

## 2012-02-26 MED ORDER — PHENYLEPHRINE HCL 10 MG/ML IJ SOLN
10.0000 mg | INTRAVENOUS | Status: DC | PRN
  Administered 2012-02-26: 20 ug/min via INTRAVENOUS

## 2012-02-26 MED ORDER — HYDROMORPHONE HCL PF 1 MG/ML IJ SOLN
0.2500 mg | INTRAMUSCULAR | Status: DC | PRN
  Administered 2012-02-26: 0.5 mg via INTRAVENOUS

## 2012-02-26 MED ORDER — INSULIN ASPART 100 UNIT/ML ~~LOC~~ SOLN: 0.0000 [IU] | Freq: Every day | SUBCUTANEOUS | Status: DC

## 2012-02-26 MED ORDER — 0.9 % SODIUM CHLORIDE (POUR BTL) OPTIME
TOPICAL | Status: DC | PRN
  Administered 2012-02-26: 1000 mL

## 2012-02-26 MED ORDER — METFORMIN HCL 500 MG PO TABS: 1000.0000 mg | ORAL_TABLET | Freq: Two times a day (BID) | ORAL | Status: DC

## 2012-02-26 MED ORDER — PROMETHAZINE HCL 25 MG PO TABS
25.0000 mg | ORAL_TABLET | Freq: Four times a day (QID) | ORAL | Status: DC | PRN
  Administered 2012-02-27: 25 mg via ORAL
  Filled 2012-02-26: qty 1

## 2012-02-26 MED ORDER — PROMETHAZINE HCL 25 MG RE SUPP: 25.0000 mg | Freq: Four times a day (QID) | RECTAL | Status: DC | PRN

## 2012-02-26 MED ORDER — PROPOFOL 10 MG/ML IV EMUL
INTRAVENOUS | Status: DC | PRN
  Administered 2012-02-26: 150 mg via INTRAVENOUS

## 2012-02-26 MED ORDER — FENTANYL CITRATE 0.05 MG/ML IJ SOLN
INTRAMUSCULAR | Status: DC | PRN
  Administered 2012-02-26: 100 ug via INTRAVENOUS
  Administered 2012-02-26 (×4): 50 ug via INTRAVENOUS

## 2012-02-26 MED ORDER — KCL IN DEXTROSE-NACL 20-5-0.45 MEQ/L-%-% IV SOLN
INTRAVENOUS | Status: DC
  Administered 2012-02-26 – 2012-02-27 (×2): via INTRAVENOUS
  Filled 2012-02-26 (×4): qty 1000

## 2012-02-26 MED ORDER — CEFAZOLIN SODIUM 1-5 GM-% IV SOLN
INTRAVENOUS | Status: AC
  Filled 2012-02-26: qty 100

## 2012-02-26 MED ORDER — ONDANSETRON HCL 4 MG/2ML IJ SOLN
INTRAMUSCULAR | Status: DC | PRN
  Administered 2012-02-26: 4 mg via INTRAVENOUS

## 2012-02-26 MED ORDER — LIDOCAINE HCL (CARDIAC) 20 MG/ML IV SOLN
INTRAVENOUS | Status: DC | PRN
  Administered 2012-02-26: 40 mg via INTRAVENOUS

## 2012-02-26 MED ORDER — NEBIVOLOL HCL 10 MG PO TABS
20.0000 mg | ORAL_TABLET | Freq: Two times a day (BID) | ORAL | Status: DC
  Administered 2012-02-26 – 2012-02-27 (×2): 20 mg via ORAL
  Filled 2012-02-26 (×4): qty 2

## 2012-02-26 SURGICAL SUPPLY — 43 items
ADH SKN CLS APL DERMABOND .7 (GAUZE/BANDAGES/DRESSINGS) ×2
ATTRACTOMAT 16X20 MAGNETIC DRP (DRAPES) ×3 IMPLANT
BLADE SURG 15 STRL LF DISP TIS (BLADE) IMPLANT
BLADE SURG 15 STRL SS (BLADE)
BLADE SURG CLIPPER 3M 9600 (MISCELLANEOUS) IMPLANT
CANISTER SUCTION 2500CC (MISCELLANEOUS) ×3 IMPLANT
CLEANER TIP ELECTROSURG 2X2 (MISCELLANEOUS) ×3 IMPLANT
CLIP TI WIDE RED SMALL 24 (CLIP) IMPLANT
CLOTH BEACON ORANGE TIMEOUT ST (SAFETY) ×3 IMPLANT
CONT SPEC 4OZ CLIKSEAL STRL BL (MISCELLANEOUS) ×3 IMPLANT
CORDS BIPOLAR (ELECTRODE) ×3 IMPLANT
COVER SURGICAL LIGHT HANDLE (MISCELLANEOUS) ×3 IMPLANT
DERMABOND ADVANCED (GAUZE/BANDAGES/DRESSINGS) ×1
DERMABOND ADVANCED .7 DNX12 (GAUZE/BANDAGES/DRESSINGS) ×2 IMPLANT
DRAIN CHANNEL 7F 3/4 FLAT (WOUND CARE) ×3 IMPLANT
ELECT COATED BLADE 2.86 ST (ELECTRODE) ×3 IMPLANT
ELECT REM PT RETURN 9FT ADLT (ELECTROSURGICAL) ×3
ELECTRODE REM PT RTRN 9FT ADLT (ELECTROSURGICAL) ×2 IMPLANT
EVACUATOR SILICONE 100CC (DRAIN) ×3 IMPLANT
GAUZE SPONGE 4X4 16PLY XRAY LF (GAUZE/BANDAGES/DRESSINGS) ×9 IMPLANT
GLOVE BIO SURGEON STRL SZ 6.5 (GLOVE) ×3 IMPLANT
GLOVE BIOGEL PI IND STRL 6.5 (GLOVE) ×2 IMPLANT
GLOVE BIOGEL PI INDICATOR 6.5 (GLOVE) ×1
GLOVE ECLIPSE 7.5 STRL STRAW (GLOVE) ×3 IMPLANT
GOWN STRL NON-REIN LRG LVL3 (GOWN DISPOSABLE) ×9 IMPLANT
KIT BASIN OR (CUSTOM PROCEDURE TRAY) ×3 IMPLANT
KIT ROOM TURNOVER OR (KITS) ×3 IMPLANT
LOCATOR NERVE 3 VOLT (DISPOSABLE) IMPLANT
NS IRRIG 1000ML POUR BTL (IV SOLUTION) ×3 IMPLANT
PAD ARMBOARD 7.5X6 YLW CONV (MISCELLANEOUS) ×6 IMPLANT
PENCIL BUTTON HOLSTER BLD 10FT (ELECTRODE) ×3 IMPLANT
SHEARS HARMONIC 9CM CVD (BLADE) ×2 IMPLANT
SPONGE INTESTINAL PEANUT (DISPOSABLE) ×6 IMPLANT
STAPLER VISISTAT 35W (STAPLE) ×3 IMPLANT
SUT SILK 2 0 FS (SUTURE) ×3 IMPLANT
SUT SILK 2 0 REEL (SUTURE) IMPLANT
SUT SILK 3 0 REEL (SUTURE) ×3 IMPLANT
SUT VICRYL 4 0 EN S 48 D7796 (SUTURE) ×3 IMPLANT
TOWEL OR 17X24 6PK STRL BLUE (TOWEL DISPOSABLE) ×3 IMPLANT
TOWEL OR 17X26 10 PK STRL BLUE (TOWEL DISPOSABLE) ×3 IMPLANT
TRAY ENT MC OR (CUSTOM PROCEDURE TRAY) ×3 IMPLANT
TRAY FOLEY CATH 14FRSI W/METER (CATHETERS) IMPLANT
WATER STERILE IRR 1000ML POUR (IV SOLUTION) IMPLANT

## 2012-02-26 NOTE — Preoperative (Signed)
Beta Blockers   Reason not to administer Beta Blockers:Not Applicable 

## 2012-02-26 NOTE — Brief Op Note (Signed)
02/26/2012  11:58 AM  PATIENT:  [name]  66 y.o. female  PRE-OPERATIVE DIAGNOSIS:  Primary hyperparathyroidism  POST-OPERATIVE DIAGNOSIS:  Primary hyperparathyroidism, right parathyroid adenoma  PROCEDURE:  Procedure(s) (LRB): PARATHYROIDECTOMY with 4-gland exploration  SURGEON:  Surgeon(s) and Role:    * Darletta Moll, MD - Primary  PHYSICIAN ASSISTANT:   ASSISTANTS: Harlan Stains, RNFA   ANESTHESIA:   general  EBL:  Minimal  BLOOD ADMINISTERED:none  DRAINS: Penrose drain in the neck   LOCAL MEDICATIONS USED:  LIDOCAINE  and Amount: 5 ml  SPECIMEN:  Source of Specimen:  Right parathyroid gland x2, right thyroid nodule  DISPOSITION OF SPECIMEN:  PATHOLOGY  COUNTS:  YES  TOURNIQUET:  * No tourniquets in log *  DICTATION: .Other Dictation: Dictation Number [id number]  PLAN OF CARE: Admit for overnight observation  PATIENT DISPOSITION:  PACU - hemodynamically stable.   Delay start of Pharmacological VTE agent (>24hrs) due to surgical blood loss or risk of bleeding: not applicable

## 2012-02-26 NOTE — Anesthesia Preprocedure Evaluation (Signed)
Anesthesia Evaluation  Patient identified by MRN, date of birth, ID band  Reviewed: Allergy & Precautions, H&P , NPO status , Patient's Chart, lab work & pertinent test results, reviewed documented beta blocker date and time   History of Anesthesia Complications (+) PROLONGED EMERGENCE  Airway Mallampati: III TM Distance: >3 FB Neck ROM: Full    Dental No notable dental hx. (+) Edentulous Upper and Dental Advisory Given   Pulmonary shortness of breath,  breath sounds clear to auscultation  Pulmonary exam normal       Cardiovascular hypertension, On Home Beta Blockers negative cardio ROS  Rhythm:Regular Rate:Normal     Neuro/Psych PSYCHIATRIC DISORDERS negative neurological ROS     GI/Hepatic Neg liver ROS, GERD-  Poorly Controlled,  Endo/Other  Diabetes mellitus-, Type 2, Oral Hypoglycemic Agents  Renal/GU negative Renal ROS  negative genitourinary   Musculoskeletal   Abdominal   Peds  Hematology negative hematology ROS (+)   Anesthesia Other Findings   Reproductive/Obstetrics negative OB ROS                           Anesthesia Physical Anesthesia Plan  ASA: III  Anesthesia Plan: General   Post-op Pain Management:    Induction: Intravenous  Airway Management Planned: Oral ETT  Additional Equipment:   Intra-op Plan:   Post-operative Plan: Extubation in OR  Informed Consent: I have reviewed the patients History and Physical, chart, labs and discussed the procedure including the risks, benefits and alternatives for the proposed anesthesia with the patient or authorized representative who has indicated his/her understanding and acceptance.   Dental advisory given  Plan Discussed with: CRNA  Anesthesia Plan Comments:         Anesthesia Quick Evaluation

## 2012-02-26 NOTE — Anesthesia Postprocedure Evaluation (Signed)
  Anesthesia Post-op Note  Patient: [name]  Procedure(s) Performed: Procedure(s) (LRB): PARATHYROIDECTOMY ()  Patient Location: PACU  Anesthesia Type: General  Level of Consciousness: awake  Airway and Oxygen Therapy: Patient Spontanous Breathing and Patient connected to nasal cannula oxygen  Post-op Pain: mild  Post-op Assessment: Post-op Vital signs reviewed, Patient's Cardiovascular Status Stable, Respiratory Function Stable, Patent Airway and No signs of Nausea or vomiting  Post-op Vital Signs: Reviewed and stable  Complications: No apparent anesthesia complications

## 2012-02-26 NOTE — H&P (Signed)
Cc: Primary hyperparathyroidism  HPI: The patient is a 66 y/o female who presents today complaining of hearing loss and hyperparathyroidism. The patient is seen in consultation requested by Dr. Syliva Overman. 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 She has had to turn up the volume of her TV over time. She denies any otalgia, otorrhea or vertigo. No recent otitis media or otitis externa. The patient also has a history of primary hyperparathyroidism. She is interested in surgical intervention. Her PTH is 106.9 with a calcium level of 10.6. Her 24 hour urine calcium is also elevated. She is currently being followed by Dr. Purcell Nails. She also has a h/o kidney stone.  No dysphagia or odynophagia. No history of seizure disorder.  She has no family history of thyroid, parathyroid, or adrenal dysfunctions. No history of ENT surgery.  She recently underwent a a sestamibi scan of the neck to localize which parathyroid gland is the source of her hyperparathyroidism. The results were inconclusive, but suggestive of a right parathyroid adenoma.  The patient's review of systems (constitutional, eyes, ENT, cardiovascular, respiratory, GI, musculoskeletal, skin, neurologic, psychiatric, endocrine, hematologic, allergic) is noted in the ROS questionnaire.  It is reviewed with the patient.    Past Medical History (Major events, hospitalizations, surgeries):  C-Section X3, Mastectomy, Appendectomy, Kidney surgery.     Known allergies: Statins.     Ongoing medical problems: Diabetes, Hypertension, Osteoporosis, Breast Cancer, IBS.     Family medical history: Problem with anesthesia.     Social history: The patient is married. She denies the use of tobacco, alcohol or illegal drugs.  Exam: General: Communicates without difficulty, well nourished, no acute distress. Head: Normocephalic, no evidence injury, no tenderness, facial buttresses  intact without stepoff. Eyes: PERRL, EOMI. No scleral icterus, conjunctivae clear. Neuro: CN II exam reveals vision grossly intact. No nystagmus at any point of gaze. Ears: Auricles well formed without lesions. Ear canals are intact without mass or lesion. No erythema or edema is appreciated. The TMs are intact without fluid. Nose: External evaluation reveals normal support and skin without lesions. Dorsum is intact. Anterior rhinoscopy reveals healthy pink mucosa over anterior aspect of inferior turbinates and intact septum. No purulence noted. Oral: Oral cavity and oropharynx are intact, symmetric, without erythema or edema. Mucosa is moist without lesions. Neck: Full range of motion without pain. There is no significant lymphadenopathy. No masses palpable. Thyroid bed within normal limits to palpation. Parotid glands and submandibular glands equal bilaterally without mass. Trachea is midline. Neuro:  CN 2-12 grossly intact. Gait normal.   A: 1. Likely right parathyroid adenoma, causing increased parathyroid hormone and increased calcium level. 2. No palpable thyroid/parathyroid mass is noted.  P: 1. The risks, benefits, alternatives, and details of the parathyroidectomy procedure are extensively discussed with the patient.    2. The possibility of parathyroid hyperplasia and multiple adenoma are also discussed with the patient. 3. The patient is interested in proceeding with the surgical procedure.  Sophiarose Eades Philomena Doheny, MD

## 2012-02-26 NOTE — Op Note (Signed)
[name], [name]               ACCOUNT NO.:  [id number]  MEDICAL RECORD NO.:  [id number]  LOCATION:  [facility]                         FACILITY:  [facility]  PHYSICIAN:  Newman Pies, MD            DATE OF BIRTH:  05-12-1946  DATE OF PROCEDURE:  02/26/2012 DATE OF DISCHARGE:                              OPERATIVE REPORT   SURGEON:  Newman Pies, MD  ASSISTANT:  Harlan Stains, RNFA  PREOPERATIVE DIAGNOSES: 1. Primary hyperparathyroidism. 2. Hypercalcemia.  POSTOPERATIVE DIAGNOSES: 1. Primary hyperparathyroidism. 2. Hypercalcemia. 3. Right parathyroid adenoma.  PROCEDURE PERFORMED:  Right parathyroid adenoma resection with 4-gland exploration.  ANESTHESIA:  General endotracheal tube anesthesia.  COMPLICATIONS:  None.  ESTIMATED BLOOD LOSS:  Minimal.  INDICATION FOR THE PROCEDURE:  The patient is a 66 year old female with a history of recurrent kidney stone, elevated parathyroid hormone level, elevated calcium level, and elevated 24-hour urine calcium level.  Her clinical pictures were consistent with primary hyperparathyroidism.  She underwent a sestamibi scan, which shows mildly increased uptake on the right side, suggesting of right parathyroid adenoma.  Based on the above findings, the decision was made for the patient to undergo excision of the likely parathyroid adenoma.  The risks, benefits, alternatives, and details of the procedure were discussed with the patient.  Questions were invited and answered.  Informed consent was obtained.  DESCRIPTION:  The patient was taken to the operating room and placed in a supine on the operating table.  General endotracheal tube anesthesia was administered by the anesthesiologist.  Preop IV antibiotic was given.  The patient was positioned and prepped and draped in a standard fashion for parathyroidectomy.  Lidocaine 1% with 1:100,000 epinephrine was injected at the planned site of incisions.  Horizontal incision over the thyroid gland was  carried out down to the level of the platysma muscles.  Subplatysmal flaps were elevated both superiorly and inferiorly.  The strap muscles were identified and divided at midline. The strap muscles were retracted laterally.  The thyroid gland was identified.  At this point, a 1 cm thyroid nodule was noted at the midportion of the thyroid gland.  The nodule was removed and sent to the Pathology Department for permanent histologic identification.  The right thyroid lobe was then carefully retracted medially and dissected free from the underlying soft tissue.  The middle thyroid vein was ligated. The recurrent laryngeal nerve was identified and preserved.  A 1 cm right inferior parathyroid gland was identified.  It was removed and sent to the pathology department.  A 2-cm parathyroid tissue was then identified superiorly.  It was also removed and sent to the Pathology Department for frozen section analysis.  The frozen section analysis showed likely normal inferior parathyroid gland with hypercellular right superior parathyroid gland, suggesting of parathyroid adenoma.  Attention was then focused on the left side.  The left thyroid lobe was also mobilized.  Two parathyroid candidates were identified.  They were noted to be approximately 1 cm in size.  They were not significantly enlarged.  They were therefore left in place.  The surgical site was copiously irrigated.  A #10 JP drain was placed.  The strap  muscles were closed with running 4-0 Vicryl sutures.  The incision was closed in layers with 4-0 Vicryl sutures and Dermabond.  That concluded procedure for the patient.  The care of the patient was turned over to the anesthesiologist.  The patient was awakened from anesthesia without difficulty.  She was extubated and transferred to the recovery room in good condition.  OPERATIVE FINDINGS: 1. A 1 cm right inferior parathyroid gland was identified and removed.     A larger 2 cm right  superior parathyroid gland was also removed.  A     1 cm thyroid nodules was also removed from the right thyroid lobe.     The frozen section analysis  was suggestive of right superior     parathyroid adenoma.  FOLLOWUP CARE:  The patient will be observed overnight in the hospital. We will evaluate the patient's calcium level and parathyroid hormone level, postop.  The patient will most likely be discharged home on postop day #1.     Newman Pies, MD     ST/MEDQ  D:  02/26/2012  T:  02/26/2012  Job:  [id number]  cc:   [name], M.D. Nicole Nails, MD

## 2012-02-26 NOTE — Transfer of Care (Signed)
Immediate Anesthesia Transfer of Care Note  Patient: [name]  Procedure(s) Performed: Procedure(s) (LRB): PARATHYROIDECTOMY ()  Patient Location: PACU  Anesthesia Type: General  Level of Consciousness: sedated  Airway & Oxygen Therapy: Patient Spontanous Breathing and Patient connected to face mask oxygen  Post-op Assessment: Report given to PACU RN and Post -op Vital signs reviewed and stable  Post vital signs: Reviewed and stable  Complications: No apparent anesthesia complications

## 2012-02-26 NOTE — Progress Notes (Signed)
Patient admitted to 5128 from PACU. Dx: S/P parathyroidectomy. Incision with skin glue. JP drain present and charged. IV fluids infusing. Oriented to room. Call bell in reach.

## 2012-02-27 LAB — GLUCOSE, CAPILLARY: Glucose-Capillary: 137 mg/dL — ABNORMAL HIGH (ref 70–99)

## 2012-02-27 NOTE — Discharge Instructions (Signed)
Parathyroidectomy A parathyroidectomy is surgery to remove one or more parathyroid glands. These glands produce a hormone (parathyroid hormone) that helps control the level of calcium in your body. The glands are very small, about the size of a pea. They are located in your neck, close to your thyroid gland and your Adam's apple. Most people (85%) have four parathyroid glands,some people may have one or two more than that. Hyperparathyroidism is when too much parathyroid hormone is being produced. Usually this is caused by one of the parathyroid glands becoming enlarged, but it can also be caused by more than one of the glands. Hyperparathyroidism is found during blood tests that show high calcium in the blood. Parathyroid hormone levels will also be elevated. Cancer also can cause hyperparathyroidism, but this is rare. For the most common type of hyperparathyroidism, the treatment is surgical removal of the parathyroid gland that is enlarged. For patients with kidney failure and hyperparathyroidism, other treatment will be tried before surgery is done on the parathyroid.  Many times x-ray studies are done to find out which parathyroid gland or glands is malfunctioning. The decision about the best treatment for hyperparathyroidism is between the patient, their primary doctor, an endocrinologist, and a surgeon experienced in parathyroid surgery. LET YOUR CAREGIVER KNOW ABOUT:  Any allergies.   All medications you are taking, including:   Herbs, eyedrops, over-the-counter medications and creams.   Blood thinners (anticoagulants), aspirin or other drugs that could affect blood clotting.   Use of steroids (by mouth or as creams).   Previous problems with anesthetics, including local anesthetics.   Possibility of pregnancy, if this applies.   Any history of blood clots.   Any history of bleeding or other blood problems.   Previous surgery.   Smoking history.   Other health problems.  RISKS  AND COMPLICATIONS   Short-term possibilities include:   Excessive bleeding.   Pain.   Infection near the incision.   Slow healing.   Pooling of blood under the wound (hematoma).   Damage to nerves in your neck.   Blood clots.   Difficulty breathing. This is very rare. It also is almost always temporary.   Longer-term possibilities include:   Scarring.   Skin damage.   Damage to blood vessels in the area.   Need for additional surgery.   A hoarse or weak voice. This is usually temporary. It can be the result of nerve damage.   Development of hypoparathyroidism. This means you are not making enough parathyroid hormone. It is rare. If it occurs, you will need to take calcium supplements daily.  BEFORE THE PROCEDURE  Sometimes the surgery is done on an outpatient basis. This means you could go home the same day as your surgery. Other times, people need to stay in the hospital overnight. Ask your surgeon what you should expect.   If your surgery will be an outpatient procedure, arrange for someone to drive you home after the surgery.   Two weeks before your surgery, stop using aspirin and non-steroidal anti-inflammatory drugs (NSAID's) for pain relief. This includes prescription drugs and over-the-counter drugs such as ibuprofen and naproxen. Also stop taking vitamin E.   If you take blood-thinners, ask your healthcare provider when you should stop taking them.   Do not eat or drink for about 8 hours before your surgery.   You might be asked to shower or wash with a special antibacterial soap before the procedure.   Arrive at least an hour before the   surgery, or whenever your surgeon recommends. This will give you time to check in and fill out any needed paperwork.  PROCEDURE  The preparation:   You will change into a hospital gown.   You will be given an IV. A needle will be inserted in your arm. Medication will be able to flow directly into your body through this  needle.   You might be given a sedative to help you relax.   You will be given a drug that puts you to sleep during the surgery (general anesthetic).   The procedure:   Once you are asleep, the surgeon will make a small cut (incision) in your lower neck. Ask your surgeon where the incision will be.   The surgeon will look for the gland(s) that are not working well. Often a tissue sample from a gland is used to determine this.   Any glands that are not working well will be removed.   The surgeon will close the incision with stitches, often these are hidden under the skin.  AFTER THE PROCEDURE  You will stay in a recovery area until the anesthesia has worn off. Your blood pressure and heart rate will be checked.   If your surgery was an outpatient procedure, you will go home the same day.   If you need to stay in the hospital, you will be moved to a hospital room. You will probably stay for two to three days. This will depend on how quickly you recover.   While you are in the hospital, your blood will be tested to check the calcium levels in your body.  HOME CARE INSTRUCTIONS   Take any medication that your surgeon prescribes. Follow the directions carefully. Take all of the medication.   Ask your surgeon whether you can take over-the-counter medicines for pain, discomfort or fever. Do not take aspirin unless your healthcare provider says to. Aspirin increases the chances of bleeding.   Do not get the wound wet for the first few days after surgery (or until the surgeon tells you it is OK).  SEEK MEDICAL CARE IF:   You notice blood or fluid leaking from the wound, or it becomes red or swollen.   You have trouble breathing.   You have trouble speaking.   You become nauseous or throw up for more than two days after the surgery.   You develop a fever of more than 100.5 F (38.1 C).  SEEK IMMEDIATE MEDICAL CARE IF:   Breathing becomes more difficult.   You develop a fever of  102.0 F (38.9 C) or higher.  Document Released: 12/06/2008 Document Revised: 08/29/2011 Document Reviewed: 12/06/2008 [employer] Patient Information 2012 [employer]. 

## 2012-02-27 NOTE — Discharge Summary (Signed)
Physician Discharge Summary  Patient ID: VIOLETTA LAVALLE MRN: [id number] DOB/AGE: 1946-09-11 66 y.o.  Admit date: 02/26/2012 Discharge date: 02/27/2012  Admission Diagnoses: Primary hyperparathyroidism  Discharge Diagnoses: Primary hyperparathyroidism Active Problems:  * No active hospital problems. *    Discharged Condition: good  Hospital Course: Pt had an uneventful overnight stay. Pt tolerated po well. No bleeding. No stridor. Neck incision C/D/I. Calcium normalized. PTH pending.  Consults: None  Significant Diagnostic Studies: None  Treatments: surgery: Parathyroid adenoma resection  Discharge Exam: Blood pressure 155/77, pulse 74, temperature 97.8 F (36.6 C), temperature source Oral, resp. rate 18, height 5\' 6"  (1.676 m), weight 89.7 kg (197 lb 12 oz), SpO2 99.00%. Neck incision C/D/I No stridor. JP removed.  Disposition:   Discharge Orders    Future Appointments: Provider: Department: Dept Phone: Center:   03/16/2012 10:30 AM Pricilla Riffle, MD [facility] [phone] [facility]   04/14/2012 11:00 AM Kerri Perches, MD [facility] [phone] [facility]   04/24/2012 1:30 PM Ellouise Newer, PA [facility] [phone] None     Future Orders Please Complete By Expires   Diet general      Increase activity slowly        Medication List  As of 02/27/2012  9:10 AM   TAKE these medications         fish oil-omega-3 fatty acids 1000 MG capsule   Take 2 g by mouth daily.      glucose blood test strip   1 each by Other route daily. Use as instructed      lansoprazole 30 MG capsule   Commonly known as: PREVACID   Take 1 capsule (30 mg total) by mouth daily.      metFORMIN 1000 MG tablet   Commonly known as: GLUCOPHAGE   Take 1,000 mg by mouth 2 (two) times daily with a meal.      multivitamin with minerals Tabs   Take 1 tablet by mouth daily.      nebivolol 10 MG tablet   Commonly known as: BYSTOLIC   Take 20 mg by mouth 2 (two) times daily.        ONE TOUCH LANCETS Misc   by Does not apply route.           Follow-up Information    Follow up with Darletta Moll, MD in 1 week. (as scheduled)    Contact information:   [address] [place] [address] [phone]          Signed: Darletta Moll 02/27/2012, 9:10 AM

## 2012-02-27 NOTE — Progress Notes (Signed)
Discharge home. Home discharge instruction given. 

## 2012-02-28 ENCOUNTER — Other Ambulatory Visit: Payer: Self-pay | Admitting: Family Medicine

## 2012-02-28 ENCOUNTER — Encounter (HOSPITAL_COMMUNITY): Payer: Self-pay | Admitting: Otolaryngology

## 2012-03-16 ENCOUNTER — Ambulatory Visit: Payer: 59 | Admitting: Internal Medicine

## 2012-04-14 ENCOUNTER — Ambulatory Visit: Payer: 59 | Admitting: Family Medicine

## 2012-04-15 ENCOUNTER — Ambulatory Visit (INDEPENDENT_AMBULATORY_CARE_PROVIDER_SITE_OTHER): Payer: 59 | Admitting: Family Medicine

## 2012-04-15 ENCOUNTER — Encounter: Payer: Self-pay | Admitting: Family Medicine

## 2012-04-15 VITALS — BP 152/86 | HR 74 | Resp 18 | Ht 66.5 in | Wt 199.1 lb

## 2012-04-15 DIAGNOSIS — E669 Obesity, unspecified: Secondary | ICD-10-CM

## 2012-04-15 DIAGNOSIS — E785 Hyperlipidemia, unspecified: Secondary | ICD-10-CM

## 2012-04-15 DIAGNOSIS — E119 Type 2 diabetes mellitus without complications: Secondary | ICD-10-CM

## 2012-04-15 DIAGNOSIS — I10 Essential (primary) hypertension: Secondary | ICD-10-CM

## 2012-04-15 LAB — COMPLETE METABOLIC PANEL WITH GFR
CO2: 26 mEq/L (ref 19–32)
Calcium: 9.3 mg/dL (ref 8.4–10.5)
GFR, Est African American: 78 mL/min
GFR, Est Non African American: 67 mL/min
Glucose, Bld: 105 mg/dL — ABNORMAL HIGH (ref 70–99)
Sodium: 141 mEq/L (ref 135–145)
Total Bilirubin: 0.5 mg/dL (ref 0.3–1.2)
Total Protein: 7.2 g/dL (ref 6.0–8.3)

## 2012-04-15 LAB — LIPID PANEL
HDL: 64 mg/dL (ref >39)
Triglycerides: 140 mg/dL (ref <150)

## 2012-04-15 MED ORDER — AMLODIPINE BESYLATE 2.5 MG PO TABS: 2.5000 mg | ORAL_TABLET | Freq: Every day | ORAL | Status: DC

## 2012-04-15 NOTE — Patient Instructions (Addendum)
Annual wellness in 4 month, pls call if you need me before.  You need the shingles vaccine, you will get a script to get this at your pharmacy  Microalb, today.  Fasting l;ipid, cmp and EGFR and HBa1C today.  It is important that you exercise regularly at least 30 minutes 5 times a week. If you develop chest pain, have severe difficulty breathing, or feel very tired, stop exercising immediately and seek medical attention   Please work on changing diet and losing weight  Blood pressure is high, add amlodipine 2.5mg  once daily and continue current blood pressure medication.  I am thankful that your surgery on the parathyroid gland went well

## 2012-04-16 LAB — MICROALBUMIN / CREATININE URINE RATIO
Creatinine, Urine: 329.4 mg/dL
Microalb Creat Ratio: 11.4 mg/g (ref 0.0–30.0)

## 2012-04-19 NOTE — Assessment & Plan Note (Signed)
Uncontrolled, statin intolerant, dietary management only at this time

## 2012-04-19 NOTE — Assessment & Plan Note (Signed)
Deteriorated. Patient re-educated about  the importance of commitment to a  minimum of 150 minutes of exercise per week. The importance of healthy food choices with portion control discussed. Encouraged to start a food diary, count calories and to consider  joining a support group. Sample diet sheets offered. Goals set by the patient for the next several months.    

## 2012-04-19 NOTE — Progress Notes (Signed)
  Subjective:    Patient ID: [name], female    DOB: 04-02-1946, 66 y.o.   MRN: [id number]  HPI The PT is here for follow up and re-evaluation of chronic medical conditions, medication management and review of any available recent lab and radiology data.  Preventive health is updated, specifically  Cancer screening and Immunization.   Questions or concerns regarding consultations or procedures which the PT has had in the interim are  Addressed.Had successful parathyroidectomy since her last visit The PT denies any adverse reactions to current medications since the last visit.  Has been overeating and has not been exercising so weight is up, states her sugars are still doing well when checked    Review of Systems See HPI Denies recent fever or chills. Denies sinus pressure, nasal congestion, ear pain or sore throat. Denies chest congestion, productive cough or wheezing. Denies chest pains, palpitations and leg swelling Denies abdominal pain, nausea, vomiting,diarrhea or constipation.   Denies dysuria, frequency, hesitancy or incontinence. Denies joint pain, swelling and limitation in mobility. Denies headaches, seizures, numbness, or tingling. Denies depression, anxiety or insomnia. Denies skin break down or rash.        Objective:   Physical Exam  Patient alert and oriented and in no cardiopulmonary distress.  HEENT: No facial asymmetry, EOMI, no sinus tenderness,  oropharynx pink and moist.  Neck supple no adenopathy.  Chest: Clear to auscultation bilaterally.  CVS: S1, S2 no murmurs, no S3.  ABD: Soft non tender. Bowel sounds normal.  Ext: No edema  MS: Adequate ROM spine, shoulders, hips and knees.  Skin: Intact, no ulcerations or rash noted.Clean surgical scar on anterior neck, well healed, no erythema or drainage  Psych: Good eye contact, normal affect. Memory intact not anxious or depressed appearing.  CNS: CN 2-12 intact, power, tone and sensation  normal throughout.  Diabetic Foot Check:  Appearance - no lesions, ulcers or calluses Skin - no unusual pallor or redness Sensation - grossly intact to light touch Monofilament testing -  Right - Great toe, medial, central, lateral ball and posterior foot intact Left - Great toe, medial, central, lateral ball and posterior foot intact Pulses Left - Dorsalis Pedis and Posterior Tibia normal Right - Dorsalis Pedis and Posterior Tibia normal      Assessment & Plan:

## 2012-04-19 NOTE — Assessment & Plan Note (Signed)
Uncontrolled additional med added, pt has f/u with cardiology in the next month DASH diet and commitment to daily physical activity for a minimum of 30 minutes discussed and encouraged, as a part of hypertension management. The importance of attaining a healthy weight is also discussed.

## 2012-04-19 NOTE — Assessment & Plan Note (Signed)
Controlled, no change in medication  

## 2012-04-24 ENCOUNTER — Other Ambulatory Visit (HOSPITAL_COMMUNITY): Payer: Self-pay | Admitting: Oncology

## 2012-04-24 ENCOUNTER — Encounter (HOSPITAL_COMMUNITY): Payer: 59 | Attending: Oncology | Admitting: Oncology

## 2012-04-24 ENCOUNTER — Encounter (HOSPITAL_COMMUNITY): Payer: Self-pay | Admitting: Oncology

## 2012-04-24 VITALS — BP 135/80 | HR 80 | Temp 98.0°F | Resp 18 | Wt 200.8 lb

## 2012-04-24 DIAGNOSIS — Z170 Estrogen receptor positive status [ER+]: Secondary | ICD-10-CM

## 2012-04-24 DIAGNOSIS — C50919 Malignant neoplasm of unspecified site of unspecified female breast: Secondary | ICD-10-CM

## 2012-04-24 DIAGNOSIS — Z139 Encounter for screening, unspecified: Secondary | ICD-10-CM

## 2012-04-24 NOTE — Progress Notes (Signed)
Nicole Overman, MD [address] [place] [address]  1. ADENOCARCINOMA, BREAST, RIGHT     CURRENT THERAPY: Observation  INTERVAL HISTORY: [name] 66 y.o. female returns for  regular  visit for followup of right-sided breast cancer measuring 1.4 x 1.2 cm, grade 2, 8 negative axillary lymph nodes for metastatic disease, ER and PR positive.  S/P right mastectomy on 10/17/1995, followed by adjuvant chemotherapy consisting of AC x 4 cycles followed by Tamoxifen for 5 years ending as of 04/09/2001.   Silas is doing very well.  She underwent a right parathyroidectomy for an hyperparathyroidism.  Dr. Suszanne Conners (ENT) performed the intervention.  She reports that her calcium is back to normal.  She is being followed by PCP for her DM control.  I have encouraged to continue PCP follow-up.   [name] is nearly 16 years out from her cancer. She was young at time of diagnosis, so we will continue to see her.  We will see her back next year, sooner if need be.  She is due for a mammogram and she admits that she forgot to have this done.  We will help her get her mammogram scheduled today.   Rolanda denies any complaints.    Past Medical History  Diagnosis Date  . GERD (gastroesophageal reflux disease)     mann  . Insomnia   . Hyperlipidemia   . Hypertension   . Obesity   . DJD (degenerative joint disease) of lumbar spine   . Degenerative disc disease, lumbar     pressing on L3 and L4  . IBS (irritable bowel syndrome)   . Hypersensitivity     in tongue  . Blood transfusion 1980's  . Hx: UTI (urinary tract infection)   . Complication of anesthesia     has a hard time waking up; can't lay flat  . PONV (postoperative nausea and vomiting)   . Dysrhythmia     "irregular"  . Heart murmur     "think I outgrew it"  . Shortness of breath     unable to lie flat  . Exertional dyspnea   . Diabetes mellitus type 1 1989    Type 2 NIDDM; "cancer treatment gave me diabetes"  .  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 Anemia   . Kidney stones 2009    s/p lithotripsy  . Abdominal hernia 02/26/12    unrepaired  . History of stomach ulcers     has ADENOCARCINOMA, BREAST, RIGHT; DIABETES MELLITUS, TYPE II; HYPERLIPIDEMIA; OBESITY; HYPERTENSION; Esophageal Reflux; GASTROPARESIS; IBS; UNSPECIFIED HYPERTROPHIC&ATROPHIC CONDITION SKIN; INSOMNIA; FATIGUE; DYSPNEA; and Chest pain, atypical on her problem list.     is allergic to demerol; statins; and ace inhibitors.  [name] does not currently have medications on file.  Past Surgical History  Procedure Date  . Cesarean section 1973; 1978; 1981  . Urological surgery for blocked ureter secondary to kidney stone   . Appendectomy   . Implant and screws put in the right lower jaw 11/2008    dental surgery  . Eye surgery   . Ovarian cyst surgery   . Kidney blockage ?1990's    " major surgery ;put kidney on pump for awhile"  . Parathyroidectomy 02/26/12  . Mastectomy 1997    right  . Abdominal hysterectomy 2002  . Cataract extraction w/ intraocular lens  implant, bilateral 2009-2010  . Back surgery   . Lumbar fusion  08/2010  . Breast biopsy     right  . Lithotripsy     "several times"  . Parathyroidectomy 02/26/2012    Procedure: PARATHYROIDECTOMY;  Surgeon: Darletta Moll, MD;  Location[facility] OR;  Service: ENT;;    Denies any headaches, dizziness, double vision, fevers, chills, night sweats, nausea, vomiting, diarrhea, constipation, chest pain, heart palpitations, shortness of breath, blood in stool, black tarry stool, urinary pain, urinary burning, urinary frequency, hematuria.   PHYSICAL EXAMINATION  ECOG PERFORMANCE STATUS: 0 - Asymptomatic  Filed Vitals:   04/24/12 1346  BP: 135/80  Pulse: 80  Temp: 98 F (36.7 C)  Resp: 18    GENERAL:alert, no distress, well nourished, well developed, comfortable, cooperative, obese and smiling SKIN: skin color, texture, turgor are  normal, no rashes or significant lesions HEAD: Normocephalic, No masses, lesions, tenderness or abnormalities EYES: normal, Conjunctiva are pink and non-injected EARS: External ears normal OROPHARYNX:lips, buccal mucosa, and tongue normal and mucous membranes are moist  NECK: supple, no adenopathy, thyroid normal size, non-tender, without nodularity, no stridor, non-tender, trachea midline, mild tenderness at site of surgical procedure. LYMPH:  no palpable lymphadenopathy, no hepatosplenomegaly BREAST:left breast normal without mass, skin or nipple changes or axillary nodes, right post-mastectomy site well healed and free of suspicious changes LUNGS: clear to auscultation and percussion HEART: regular rate & rhythm, no murmurs, no gallops, S1 normal and S2 normal ABDOMEN:abdomen soft, non-tender, obese, normal bowel sounds, no masses or organomegaly and no hepatosplenomegaly BACK: Back symmetric, no curvature., No CVA tenderness EXTREMITIES:less then 2 second capillary refill, no joint deformities, effusion, or inflammation, no edema, no skin discoloration, no clubbing, no cyanosis  NEURO: alert & oriented x 3 with fluent speech, no focal motor/sensory deficits, gait normal    LABORATORY DATA:   Chemistry      Component Value Date/Time   NA 141 04/15/2012 1130   K 4.0 04/15/2012 1130   CL 106 04/15/2012 1130   CO2 26 04/15/2012 1130   BUN 9 04/15/2012 1130   CREATININE 0.90 04/15/2012 1130   CREATININE 0.80 02/19/2012 1002      Component Value Date/Time   CALCIUM 9.3 04/15/2012 1130   CALCIUM 9.9 02/26/2012 1551   ALKPHOS 49 04/15/2012 1130   AST 28 04/15/2012 1130   ALT 29 04/15/2012 1130   BILITOT 0.5 04/15/2012 1130       ASSESSMENT:  1. Right sided breast cancer, ER/PR positive, S/P mastectomy on 10/17/1995, followed by [facility] x 4, followed by Tamoxifen x 5 years, ending as of 04/09/2001.  2. Obesity.  3. GERD  4. IBS   PLAN:  1. Patient is due for mammogram. 2. Continue follow-up  with PCP 3. Return in 1 year.  All questions were answered. The patient knows to call the clinic with any problems, questions or concerns. We can certainly see the patient much sooner if necessary.  KEFALAS,THOMAS

## 2012-04-24 NOTE — Patient Instructions (Addendum)
[name]  [id number] Apr 15, 1946 Dr. Glenford Peers  [facility]  Discharge Instructions  RECOMMENDATIONS MADE BY THE CONSULTANT AND ANY TEST RESULTS WILL BE SENT TO YOUR REFERRING DOCTOR.   Return to clinic in 1 year. Have your mammograms yearly.   I acknowledge that I have been informed and understand all the instructions given to me and received a copy. I do not have any more questions at this time, but understand that I may call the Specialty Clinic at [facility] at [phone] during business hours should I have any further questions or need assistance in obtaining follow-up care.    __________________________________________  _____________  __________ Signature of Patient or Authorized Representative            Date                   Time    __________________________________________ Nurse's Signature

## 2012-04-28 ENCOUNTER — Ambulatory Visit (HOSPITAL_COMMUNITY): Payer: Medicare Other

## 2012-04-30 ENCOUNTER — Ambulatory Visit (HOSPITAL_COMMUNITY)
Admission: RE | Admit: 2012-04-30 | Discharge: 2012-04-30 | Disposition: A | Discharge status: Home / Self Care | Payer: Medicare Other | Source: Ambulatory Visit | Attending: Oncology | Admitting: Oncology

## 2012-04-30 DIAGNOSIS — Z1231 Encounter for screening mammogram for malignant neoplasm of breast: Secondary | ICD-10-CM | POA: Insufficient documentation

## 2012-04-30 DIAGNOSIS — Z139 Encounter for screening, unspecified: Secondary | ICD-10-CM

## 2012-05-11 ENCOUNTER — Encounter: Payer: Self-pay | Admitting: Internal Medicine

## 2012-05-11 ENCOUNTER — Ambulatory Visit (INDEPENDENT_AMBULATORY_CARE_PROVIDER_SITE_OTHER): Payer: Medicare Other | Admitting: Internal Medicine

## 2012-05-11 VITALS — BP 120/78 | HR 80 | Ht 66.0 in | Wt 203.0 lb

## 2012-05-11 DIAGNOSIS — R5383 Other fatigue: Secondary | ICD-10-CM

## 2012-05-11 DIAGNOSIS — R0789 Other chest pain: Secondary | ICD-10-CM

## 2012-05-11 DIAGNOSIS — R5381 Other malaise: Secondary | ICD-10-CM

## 2012-05-11 DIAGNOSIS — E782 Mixed hyperlipidemia: Secondary | ICD-10-CM

## 2012-05-11 LAB — CBC WITH DIFFERENTIAL/PLATELET
Eosinophils Absolute: 0.1 10*3/uL (ref 0.0–0.7)
Lymphs Abs: 3.4 10*3/uL (ref 0.7–4.0)
MCHC: 31.5 g/dL (ref 30.0–36.0)
MCV: 80 fl (ref 78.0–100.0)
Monocytes Absolute: 0.8 10*3/uL (ref 0.1–1.0)
Neutrophils Relative %: 53.6 % (ref 43.0–77.0)
Platelets: 234 10*3/uL (ref 150.0–400.0)
RDW: 16.1 % — ABNORMAL HIGH (ref 11.5–14.6)
WBC: 9.4 10*3/uL (ref 4.5–10.5)

## 2012-05-11 MED ORDER — EZETIMIBE 10 MG PO TABS: 10.0000 mg | ORAL_TABLET | Freq: Every day | ORAL | Status: DC

## 2012-05-11 NOTE — Progress Notes (Signed)
HPI Patient is a 66 year old with a history of DOE, HTN, HL, DM IBS.  She saw Wende Mott in clinic for preop evaluation in March.  Stress myoview was normal  Surgery went well. Since on norvasc balance has been off  Doesn't feel good.  Tired. hgb A1C 6.3. Lipid panel in July LDL was 128, HDL was 64. Allergies  Allergen Reactions  . Demerol (Meperidine) Other (See Comments)    Lost consciousness  . Statins Other (See Comments)    myalgia  . Ace Inhibitors Cough    Current Outpatient Prescriptions  Medication Sig Dispense Refill  . amLODipine (NORVASC) 2.5 MG tablet Take 1 tablet (2.5 mg total) by mouth daily.  30 tablet  11  . fish oil-omega-3 fatty acids 1000 MG capsule Take 2 g by mouth daily.      Marland Kitchen GLUCOPHAGE 1000 MG tablet TAKE 1 TABLET BY MOUTH TWICE DAILY WITH FOOD FOR DIABETES.  60 each  2  . glucose blood test strip 1 each by Other route daily. Use as instructed       . lansoprazole (PREVACID) 30 MG capsule Take 1 capsule (30 mg total) by mouth daily.  30 capsule  3  . Multiple Vitamin (MULITIVITAMIN WITH MINERALS) TABS Take 1 tablet by mouth daily.      . nebivolol (BYSTOLIC) 10 MG tablet Take 20 mg by mouth 2 (two) times daily.      . ONE TOUCH LANCETS MISC by Does not apply route.          Past Medical History  Diagnosis Date  . GERD (gastroesophageal reflux disease)     mann  . Insomnia   . Hyperlipidemia   . Hypertension   . Obesity   . DJD (degenerative joint disease) of lumbar spine   . Degenerative disc disease, lumbar     pressing on L3 and L4  . IBS (irritable bowel syndrome)   . Hypersensitivity     in tongue  . Blood transfusion 1980's  . Hx: UTI (urinary tract infection)   . Complication of anesthesia     has a hard time waking up; can't lay flat  . PONV (postoperative nausea and vomiting)   . Dysrhythmia     "irregular"  . Heart murmur     "think I outgrew it"  . Shortness of breath     unable to lie flat  . Exertional dyspnea   . Diabetes  mellitus type 1 1989    Type 2 NIDDM; "cancer treatment gave me diabetes"  . 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 Anemia   . Kidney stones 2009    s/p lithotripsy  . Abdominal hernia 02/26/12    unrepaired  . History of stomach ulcers     Past Surgical History  Procedure Date  . Cesarean section 1973; 1978; 1981  . Urological surgery for blocked ureter secondary to kidney stone   . Appendectomy   . Implant and screws put in the right lower jaw 11/2008    dental surgery  . Eye surgery   . Ovarian cyst surgery   . Kidney blockage ?1990's    " major surgery ;put kidney on pump for awhile"  . Parathyroidectomy 02/26/12  . Mastectomy 1997    right  . Abdominal hysterectomy 2002  . Cataract extraction w/ intraocular lens  implant, bilateral 2009-2010  . Back surgery   . Lumbar fusion 08/2010  .  Breast biopsy     right  . Lithotripsy     "several times"  . Parathyroidectomy 02/26/2012    Procedure: PARATHYROIDECTOMY;  Surgeon: Darletta Moll, MD;  Location: [facility] OR;  Service: ENT;;    Family History  Problem Relation Age of Onset  . Heart attack Mother   . Cancer Brother     Esophageal   . Anesthesia problems Neg Hx     History   Social History  . Marital Status: Married    Spouse Name: N/A    Number of Children: 3  . Years of Education: N/A   Occupational History  . retired in 2011    Social History Main Topics  . Smoking status: Never Smoker   . Smokeless tobacco: Never Used  . Alcohol Use: No  . Drug Use: No  . Sexually Active: Not Currently   Other Topics Concern  . Not on file   Social History Narrative  . No narrative on file    Review of Systems:  All systems reviewed.  They are negative to the above problem except as previously stated.  Vital Signs: BP 120/78  Pulse 80  Ht 5\' 6"  (1.676 m)  Wt 203 lb (92.08 kg)  BMI 32.76 kg/m2  Physical Exam Patient is in NAD HEENT:  Normocephalic, atraumatic. EOMI, PERRLA.   Neck: JVP is normal.  No bruits.  Lungs: clear to auscultation. No rales no wheezes.  Heart: Regular rate and rhythm. Normal S1, S2. No S3.   No significant murmurs. PMI not displaced.  Abdomen:  Supple, nontender. Normal bowel sounds. No masses. No hepatomegaly.  Extremities:   Good distal pulses throughout. No lower extremity edema.  Musculoskeletal :moving all extremities.  Neuro:   alert and oriented x3.  CN II-XII grossly intact.   Assessment and Plan:  1.  Cardiac risk assessment.  Patient with no known CAD.  Several risks.  Recent myoview without ischemia.  Patient denies CP.  Plan:  Continued risk factor modification  2.  HTN.  Patient is on low dose amlodipine.  Dizzy at times, tired.  Recomm she d/cs  Follow BP  3.  HL  Patient's lipids are not adequately controlled given that she is a diabetic. She has tried several statins.  Did not tolerate due to myalgias.  I do not have the names.   I would recomm a trial fo Zetia with f/u lipid in 8 wks. Watch diet.

## 2012-05-11 NOTE — Patient Instructions (Addendum)
STOP Norvasc  Start Zetia 10mg  1 per day  Lab work today We will call you with results.  Fasting Lab work in 2 months in [facility]

## 2012-06-06 ENCOUNTER — Other Ambulatory Visit: Payer: Self-pay | Admitting: Family Medicine

## 2012-07-14 LAB — BASIC METABOLIC PANEL
BUN: 8 mg/dL (ref 6–23)
CO2: 24 mEq/L (ref 19–32)
Chloride: 107 mEq/L (ref 96–112)
Creat: 0.93 mg/dL (ref 0.50–1.10)
Glucose, Bld: 121 mg/dL — ABNORMAL HIGH (ref 70–99)

## 2012-07-14 LAB — HEMOGLOBIN A1C: Hgb A1c MFr Bld: 6.7 % — ABNORMAL HIGH (ref <5.7)

## 2012-07-19 ENCOUNTER — Other Ambulatory Visit: Payer: Self-pay | Admitting: Family Medicine

## 2012-08-06 ENCOUNTER — Ambulatory Visit (INDEPENDENT_AMBULATORY_CARE_PROVIDER_SITE_OTHER): Payer: Medicare Other | Admitting: Family Medicine

## 2012-08-06 ENCOUNTER — Encounter: Payer: Self-pay | Admitting: Family Medicine

## 2012-08-06 VITALS — BP 130/76 | HR 68 | Resp 18 | Ht 66.5 in | Wt 200.1 lb

## 2012-08-06 DIAGNOSIS — E785 Hyperlipidemia, unspecified: Secondary | ICD-10-CM

## 2012-08-06 DIAGNOSIS — E119 Type 2 diabetes mellitus without complications: Secondary | ICD-10-CM

## 2012-08-06 DIAGNOSIS — Z0000 Encounter for general adult medical examination without abnormal findings: Secondary | ICD-10-CM

## 2012-08-06 NOTE — Patient Instructions (Addendum)
F/u in 4 month  Fasting lipid, cmp and EGFR, and hBA1C in 4 month  Please try commit to daily exercise and also to lose weight , you will not be as tired

## 2012-08-06 NOTE — Progress Notes (Signed)
Subjective:    Patient ID: [name], female    DOB: 1946/01/23, 66 y.o.   MRN: [id number]  HPI Preventive Screening-Counseling & Management   Patient present here today for a Medicare annual wellness visit.   Current Problems (verified)   Medications Prior to Visit Allergies (verified)   PAST HISTORY  Family History.4 siblings living , one brother deceased in his 28's esophageal cancer, one sibling died at  Month due to hemmorhage. No  Cancer , CAD or stroke history  Social History Married x x 42 years, 3 children , 2 living, 2 daughters living ages 73 and 30, one daughter deceased at age 40. Never nicotine or alcohol dependence   Risk Factors  Current exercise habits:  None will commit to 2 miles per day , has fatigue  Dietary issues discussed:has severe IBS diarrhea predominant which is frustrating to her All foods cause this   Cardiac risk factors: diabetes  Depression Screen  (Note: if answer to either of the following is "Yes", a more complete depression screening is indicated)   Over the past two weeks, have you felt down, depressed or hopeless? No  Over the past two weeks, have you felt little interest or pleasure in doing things? No  Have you lost interest or pleasure in daily life? No  Do you often feel hopeless? No  Do you cry easily over simple problems? No   Activities of Daily Living  In your present state of health, do you have any difficulty performing the following activities?  Driving?: No Managing money?: No Feeding yourself?:No Getting from bed to chair?:No Climbing a flight of stairs?:No Preparing food and eating?:No Bathing or showering?:No Getting dressed?:No Getting to the toilet?:No Using the toilet?:No Moving around from place to place?: No  Fall Risk Assessment In the past year have you fallen or had a near fall?:No Are you currently taking any medications that make you dizziness?:No   Hearing Difficulties: No Do you often  ask people to speak up or repeat themselves?:yes, has been tested this year and does not need aids Do you experience ringing or noises in your ears?:No Do you have difficulty understanding soft or whispered voices?:yes  Cognitive Testing  Alert? Yes Normal Appearance?Yes  Oriented to person? Yes Place? Yes  Time? Yes  Displays appropriate judgment?Yes  Can read the correct time from a watch face? yes Are you having problems remembering things?No  Advanced Directives have been discussed with the patient?Yes , pt states she already has a living will which is legal, and her daughter and husband are aware   List the Names of Other Physician/Practitioners you currently use: see list     Assessment:    Annual Wellness Exam   Plan:    During the course of the visit the patient was educated and counseled about appropriate screening and preventive services including:  A healthy diet is rich in fruit, vegetables and whole grains. Poultry fish, nuts and beans are a healthy choice for protein rather then red meat. A low sodium diet and drinking 64 ounces of water daily is generally recommended. Oils and sweet should be limited. Carbohydrates especially for those who are diabetic or overweight, should be limited to 30-45 gram per meal. It is important to eat on a regular schedule, at least 3 times daily. Snacks should be primarily fruits, vegetables or nuts. It is important that you exercise regularly at least 30 minutes 5 times a week. If you develop chest pain, have severe difficulty  breathing, or feel very tired, stop exercising immediately and seek medical attention  Immunization reviewed and updated.Needs flu vaccinae and advised of same, also zostavax Cancer screening reviewed and updated    Patient Instructions (the written plan) was given to the patient.  Medicare Attestation  I have personally reviewed:  The patient's medical and social history  Their use of alcohol, tobacco or  illicit drugs  Their current medications and supplements  The patient's functional ability including ADLs,fall risks, home safety risks, cognitive, and hearing and visual impairment  Diet and physical activities  Evidence for depression or mood disorders  The patient's weight, height, BMI, and visual acuity have been recorded in the chart. I have made referrals, counseling, and provided education to the patient based on review of the above and I have provided the patient with a written personalized care plan for preventive services.      Review of Systems     Objective:   Physical Exam        Assessment & Plan:

## 2012-08-09 DIAGNOSIS — Z0000 Encounter for general adult medical examination without abnormal findings: Secondary | ICD-10-CM | POA: Insufficient documentation

## 2012-08-09 NOTE — Assessment & Plan Note (Signed)
Annual wellness completed and documented Main issue is the need for commitment to daily physical activity and weigh loss. Pt is extremely functional, involved positively with community , though retired, and has no limitation in ability to care for herself He is not depressed, has no memory or cognitive impairment. End of life issues have already been discussed and are in legal document, both her spouse and one daughter know her wishes. Essentially , she is a full code, however in the event of poor prognosis within reasonable effort, she wishes disconnection of support

## 2012-11-23 ENCOUNTER — Other Ambulatory Visit: Payer: Self-pay | Admitting: Family Medicine

## 2012-12-01 ENCOUNTER — Other Ambulatory Visit: Payer: Self-pay | Admitting: Family Medicine

## 2012-12-01 ENCOUNTER — Encounter: Payer: Self-pay | Admitting: Family Medicine

## 2012-12-01 ENCOUNTER — Ambulatory Visit (INDEPENDENT_AMBULATORY_CARE_PROVIDER_SITE_OTHER): Payer: Medicare Other | Admitting: Family Medicine

## 2012-12-01 VITALS — BP 140/76 | HR 77 | Resp 18 | Ht 66.5 in | Wt 197.1 lb

## 2012-12-01 DIAGNOSIS — E119 Type 2 diabetes mellitus without complications: Secondary | ICD-10-CM

## 2012-12-01 DIAGNOSIS — N390 Urinary tract infection, site not specified: Secondary | ICD-10-CM

## 2012-12-01 DIAGNOSIS — R5381 Other malaise: Secondary | ICD-10-CM

## 2012-12-01 DIAGNOSIS — IMO0001 Reserved for inherently not codable concepts without codable children: Secondary | ICD-10-CM

## 2012-12-01 DIAGNOSIS — N3000 Acute cystitis without hematuria: Secondary | ICD-10-CM

## 2012-12-01 DIAGNOSIS — K219 Gastro-esophageal reflux disease without esophagitis: Secondary | ICD-10-CM

## 2012-12-01 DIAGNOSIS — E860 Dehydration: Secondary | ICD-10-CM

## 2012-12-01 LAB — CBC
Hemoglobin: 11.7 g/dL — ABNORMAL LOW (ref 12.0–15.0)
MCH: 24.5 pg — ABNORMAL LOW (ref 26.0–34.0)
MCHC: 32.3 g/dL (ref 30.0–36.0)
Platelets: 250 10*3/uL (ref 150–400)
RDW: 17.2 % — ABNORMAL HIGH (ref 11.5–15.5)

## 2012-12-01 LAB — POCT URINALYSIS DIPSTICK
Glucose, UA: NEGATIVE
Nitrite, UA: NEGATIVE

## 2012-12-01 LAB — BASIC METABOLIC PANEL
BUN: 12 mg/dL (ref 6–23)
CO2: 28 mEq/L (ref 19–32)
Chloride: 103 mEq/L (ref 96–112)
Creat: 0.83 mg/dL (ref 0.50–1.10)
Glucose, Bld: 124 mg/dL — ABNORMAL HIGH (ref 70–99)

## 2012-12-01 MED ORDER — PANTOPRAZOLE SODIUM 40 MG PO TBEC: 40.0000 mg | DELAYED_RELEASE_TABLET | Freq: Every day | ORAL | Status: DC

## 2012-12-01 MED ORDER — CIPROFLOXACIN HCL 500 MG PO TABS: 500.0000 mg | ORAL_TABLET | Freq: Two times a day (BID) | ORAL | Status: AC

## 2012-12-01 NOTE — Patient Instructions (Addendum)
Get the labs drawn  Start the antibiotics Increase your fluids - water  Start protonix  Keep previous follow-up appt

## 2012-12-01 NOTE — Progress Notes (Signed)
  Subjective:    Patient ID: [name], female    DOB: Oct 27, 1945, 67 y.o.   MRN: [id number]  HPI  Patient presents with not feeling well for the past week. She noticed it she's had increased acid reflux and she's been hoarse but she hasn't had any fevers her stomach and felt very fatigued like someone that was having a heart attack or stroke. She states she took aspirin and this improved. She's also had some urinary frequency with mild back pain her appetite is also been decreased but she denies any fever nausea vomiting diarrhea. She's been drinking a lot of sugary beverages and has not been taking her blood sugar.   Review of Systems  GEN- + fatigue, fever, weight loss,weakness, recent illness HEENT- denies eye drainage, change in vision, nasal discharge, CVS- denies chest pain, palpitations RESP- denies SOB, cough, wheeze ABD- denies N/V, change in stools, abd pain GU- denies dysuria, hematuria, dribbling, incontinence, + urinary frequency MSK- denies joint pain, muscle aches, injury Neuro- denies headache, dizziness, syncope, seizure activity      Objective:   Physical Exam GEN- NAD, alert and oriented x3 HEENT- PERRL, EOMI, non injected sclera, pink conjunctiva, MMM, oropharynx clear Neck- Supple, no LAD CVS- RRR, no murmur ABD-NABS,soft,NT, ND, mild Right flank tenderness RESP-CTAB EXT- No edema Pulses- Radial, DP- 2+ Neuro- CNII-XII in tact, no focal deficits EKG- NSR, inverted t wave, compared to March 2013 unchanged       Assessment & Plan:

## 2012-12-02 ENCOUNTER — Other Ambulatory Visit: Payer: Self-pay

## 2012-12-02 DIAGNOSIS — E860 Dehydration: Secondary | ICD-10-CM | POA: Insufficient documentation

## 2012-12-02 DIAGNOSIS — N390 Urinary tract infection, site not specified: Secondary | ICD-10-CM | POA: Insufficient documentation

## 2012-12-02 LAB — URINE CULTURE

## 2012-12-02 LAB — HEMOGLOBIN A1C: Hgb A1c MFr Bld: 6.9 % — ABNORMAL HIGH (ref <5.7)

## 2012-12-02 MED ORDER — NEBIVOLOL HCL 10 MG PO TABS: ORAL_TABLET | ORAL | Status: DC

## 2012-12-02 NOTE — Assessment & Plan Note (Signed)
Combination of mild dehydration, UTI see above

## 2012-12-02 NOTE — Assessment & Plan Note (Signed)
Acid reflux causing discomfort- trial of protonix, has failed OTC meds and prevacid

## 2012-12-02 NOTE — Assessment & Plan Note (Signed)
I do not think this is pyelonephritis, but will use Cipro for any upper tract infection

## 2012-12-02 NOTE — Assessment & Plan Note (Signed)
She has not been checking CBG, in office 126, check A1C and labs due to decreased intake and taking metformin

## 2012-12-02 NOTE — Assessment & Plan Note (Signed)
She has been overdoing sweetened beverage, urine suggest mild dehydration discussed water, no sugar beverages, small meals

## 2012-12-04 ENCOUNTER — Telehealth: Payer: Self-pay | Admitting: Family Medicine

## 2012-12-04 LAB — ANEMIA PANEL
Folate: 16.1 ng/mL
TIBC: 415 ug/dL (ref 250–470)
UIBC: 380 ug/dL (ref 125–400)
Vitamin B-12: 610 pg/mL (ref 211–911)

## 2012-12-06 NOTE — Telephone Encounter (Signed)
pls advise if better, ok to reschedule to 7/26 or after, She needs fasting lipid, cmp and EGfr, HBa1C, microalb 7/24 or after, but before next appt, pls order labs and let her know Also put her to front to get the new appt  Cancel 3/18, let her know I see the blood sugar avg test was excellent on 3/11, keep it up, BP at oV also was OK

## 2012-12-06 NOTE — Telephone Encounter (Signed)
pls sched appt for 7/26 or after, and cancel 3/18 and let her know

## 2012-12-07 NOTE — Telephone Encounter (Signed)
Appointment 7.28.14 at 11:00 patient is aware

## 2012-12-08 ENCOUNTER — Ambulatory Visit: Payer: Medicare Other | Admitting: Family Medicine

## 2012-12-18 ENCOUNTER — Encounter: Payer: Self-pay | Admitting: Family Medicine

## 2013-03-10 ENCOUNTER — Other Ambulatory Visit: Payer: Self-pay | Admitting: Family Medicine

## 2013-04-01 ENCOUNTER — Other Ambulatory Visit: Payer: Self-pay

## 2013-04-12 ENCOUNTER — Other Ambulatory Visit: Payer: Self-pay | Admitting: Family Medicine

## 2013-04-19 ENCOUNTER — Ambulatory Visit: Payer: Medicare Other | Admitting: Family Medicine

## 2013-04-24 ENCOUNTER — Other Ambulatory Visit: Payer: Self-pay | Admitting: Family Medicine

## 2013-04-26 ENCOUNTER — Ambulatory Visit (HOSPITAL_COMMUNITY): Payer: 59 | Admitting: Oncology

## 2013-04-26 NOTE — Progress Notes (Signed)
-  No show, letter sent-   

## 2013-05-21 ENCOUNTER — Encounter (HOSPITAL_COMMUNITY): Payer: Medicare Other | Attending: Oncology | Admitting: Oncology

## 2013-05-21 ENCOUNTER — Other Ambulatory Visit (HOSPITAL_COMMUNITY): Payer: Self-pay | Admitting: Oncology

## 2013-05-21 ENCOUNTER — Other Ambulatory Visit: Payer: Self-pay | Admitting: Family Medicine

## 2013-05-21 ENCOUNTER — Encounter (HOSPITAL_COMMUNITY): Payer: Self-pay | Admitting: Oncology

## 2013-05-21 VITALS — BP 130/77 | HR 79 | Temp 98.5°F | Resp 18 | Wt 201.7 lb

## 2013-05-21 DIAGNOSIS — Z853 Personal history of malignant neoplasm of breast: Secondary | ICD-10-CM

## 2013-05-21 DIAGNOSIS — C50919 Malignant neoplasm of unspecified site of unspecified female breast: Secondary | ICD-10-CM

## 2013-05-21 DIAGNOSIS — Z139 Encounter for screening, unspecified: Secondary | ICD-10-CM

## 2013-05-21 LAB — CBC
Platelets: 261 10*3/uL (ref 150–400)
RBC: 4.63 MIL/uL (ref 3.87–5.11)
RDW: 17.9 % — ABNORMAL HIGH (ref 11.5–15.5)
WBC: 8.5 10*3/uL (ref 4.0–10.5)

## 2013-05-21 LAB — BASIC METABOLIC PANEL
CO2: 28 mEq/L (ref 19–32)
Calcium: 9.3 mg/dL (ref 8.4–10.5)
Chloride: 102 mEq/L (ref 96–112)
Creat: 1 mg/dL (ref 0.50–1.10)
Sodium: 138 mEq/L (ref 135–145)

## 2013-05-21 NOTE — Progress Notes (Signed)
Syliva Overman, MD [address], [address] [place] [address]  ADENOCARCINOMA, BREAST, RIGHT  CURRENT THERAPY: Observation  INTERVAL HISTORY: [name] 67 y.o. female returns for  regular  visit for followup of right-sided breast cancer measuring 1.4 x 1.2 cm, grade 2, 8 negative axillary lymph nodes for metastatic disease, ER and PR positive.  S/P right mastectomy on 10/17/1995, followed by adjuvant chemotherapy consisting of AC x 4 cycles followed by Tamoxifen for 5 years ending as of 04/09/2001.   She is 17 years out from her Breast cancer diagnosis and was therefore diagnosed at the age of 53.  She has never had genetics testing and therefore I will refer to [place] for genetic testing as cancer, she reports, runs in her family.  She reports a family history of upper GI cancer, colon concer, prostate cancer, leukemia, etc.  She denies any ovarian or endometrial cancers that she is aware of.  I have asked her to make a list of family member who have had cancer in preparation for this appointment.   She denies any complaints, but does report right UE edema after heavy use.  She reports that she noted right UE edema after gardening and cooking.  I provided her education regarding lymphedema, and she is at increased risk for this condition due to her mastectomy for breast cancer.  She reports that she will call us in the future if she wishes to have a lymphedema clinic referral, she declines it today.  She is about 2 weeks overdue for her screening mammogram and I have encouraged her to have this done.   She otherwise denies any complaints and ROS questioning is negative.   I have offered to release her from the clinic since she is 17 years out from cancer, but she requests annual appointments for breast examinations.   Past Medical History  Diagnosis Date  . GERD (gastroesophageal reflux disease)     mann  . Insomnia   . Hyperlipidemia   . Hypertension   . Obesity   . DJD  (degenerative joint disease) of lumbar spine   . Degenerative disc disease, lumbar     pressing on L3 and L4  . IBS (irritable bowel syndrome)   . Hypersensitivity     in tongue  . Blood transfusion 1980's  . Hx: UTI (urinary tract infection)   . Complication of anesthesia     has a hard time waking up; can't lay flat  . PONV (postoperative nausea and vomiting)   . Dysrhythmia     "irregular"  . Heart murmur     "think I outgrew it"  . Shortness of breath     unable to lie flat  . Exertional dyspnea   . Diabetes mellitus type 1 1989    Type 2 NIDDM; "cancer treatment gave me diabetes"  . 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 Anemia   . Kidney stones 2009    s/p lithotripsy  . Abdominal hernia 02/26/12    unrepaired  . History of stomach ulcers     has ADENOCARCINOMA, BREAST, RIGHT; DIABETES MELLITUS, TYPE II; HYPERLIPIDEMIA; OBESITY; HYPERTENSION; Esophageal reflux; GASTROPARESIS; IBS; UNSPECIFIED HYPERTROPHIC&ATROPHIC CONDITION SKIN; INSOMNIA; FATIGUE; DYSPNEA; Chest pain, atypical; Routine general medical examination at a health care facility; UTI (urinary tract infection); and Mild dehydration on her problem list.     is allergic to demerol; statins; and ace inhibitors.  Ms. Sweigert does  not currently have medications on file.  Past Surgical History  Procedure Laterality Date  . Cesarean section  1973; 1978; 1981  . Urological surgery for blocked ureter secondary to kidney stone    . Appendectomy    . Implant and screws put in the right lower jaw  11/2008    dental surgery  . Eye surgery    . Ovarian cyst surgery    . Kidney blockage  ?1990's    " major surgery ;put kidney on pump for awhile"  . Parathyroidectomy  02/26/12  . Mastectomy  1997    right  . Abdominal hysterectomy  2002  . Cataract extraction w/ intraocular lens  implant, bilateral  2009-2010  . Back surgery    . Lumbar fusion  08/2010  . Breast biopsy      right  .  Lithotripsy      "several times"  . Parathyroidectomy  02/26/2012    Procedure: PARATHYROIDECTOMY;  Surgeon: Darletta Moll, MD;  Location[facility];  Service: ENT;;  . Spine surgery  2011    Denies any headaches, dizziness, double vision, fevers, chills, night sweats, nausea, vomiting, diarrhea, constipation, chest pain, heart palpitations, shortness of breath, blood in stool, black tarry stool, urinary pain, urinary burning, urinary frequency, hematuria.   PHYSICAL EXAMINATION  ECOG PERFORMANCE STATUS: 0 - Asymptomatic  Filed Vitals:   05/21/13 1150  BP: 130/77  Pulse: 79  Temp: 98.5 F (36.9 C)  Resp: 18    GENERAL:alert, no distress, well nourished, well developed, comfortable, cooperative, obese and smiling SKIN: skin color, texture, turgor are normal, no rashes or significant lesions HEAD: Normocephalic, No masses, lesions, tenderness or abnormalities EYES: normal, Conjunctiva are pink and non-injected EARS: External ears normal OROPHARYNX:lips, buccal mucosa, and tongue normal and mucous membranes are moist  NECK: supple, no adenopathy, thyroid normal size, non-tender, without nodularity, no stridor, non-tender, trachea midline, mild tenderness at site of surgical procedure. LYMPH:  no palpable lymphadenopathy, no hepatosplenomegaly BREAST:left breast normal without mass, skin or nipple changes or axillary nodes, right post-mastectomy site well healed and free of suspicious changes LUNGS: clear to auscultation and percussion HEART: regular rate & rhythm, no murmurs, no gallops, S1 normal and S2 normal ABDOMEN:abdomen soft, non-tender, obese, normal bowel sounds, no masses or organomegaly and no hepatosplenomegaly BACK: Back symmetric, no curvature., No CVA tenderness EXTREMITIES:less then 2 second capillary refill, no joint deformities, effusion, or inflammation, no edema, no skin discoloration, no clubbing, no cyanosis.  Mild right UE edema, lymphedema. NEURO: alert & oriented x 3  with fluent speech, no focal motor/sensory deficits, gait normal    LABORATORY DATA:   Chemistry      Component Value Date/Time   NA 140 12/01/2012 1214   K 4.0 12/01/2012 1214   CL 103 12/01/2012 1214   CO2 28 12/01/2012 1214   BUN 12 12/01/2012 1214   CREATININE 0.83 12/01/2012 1214   CREATININE 0.80 02/19/2012 1002      Component Value Date/Time   CALCIUM 10.2 12/01/2012 1214   CALCIUM 9.9 02/26/2012 1551   ALKPHOS 49 04/15/2012 1130   AST 28 04/15/2012 1130   ALT 29 04/15/2012 1130   BILITOT 0.5 04/15/2012 1130       ASSESSMENT:  1. Right sided breast cancer, ER/PR positive, S/P mastectomy on 10/17/1995, followed by [facility] x 4, followed by Tamoxifen x 5 years, ending as of 04/09/2001.  No evidence of recurrence. 2. Obesity.  3. GERD  4. IBS. 5. Right UE lymphedema.  Patient Active Problem List   Diagnosis Date Noted  . UTI (urinary tract infection) 12/02/2012  . Mild dehydration 12/02/2012  . Routine general medical examination at a health care facility 08/09/2012  . Chest pain, atypical 12/11/2011  . FATIGUE 12/26/2009  . DYSPNEA 04/10/2009  . DIABETES MELLITUS, TYPE II 12/30/2008  . GASTROPARESIS 12/15/2008  . UNSPECIFIED HYPERTROPHIC&ATROPHIC CONDITION SKIN 07/17/2008  . Esophageal reflux 06/21/2008  . IBS 06/21/2008  . ADENOCARCINOMA, BREAST, RIGHT 02/09/2008  . HYPERLIPIDEMIA 02/09/2008  . OBESITY 02/09/2008  . HYPERTENSION 02/09/2008  . INSOMNIA 02/09/2008     PLAN:  1. I personally reviewed and went over radiographic studies with the patient. 2. Patient declined lymphedema referral at this time.  She was encouraged to contact me if lyphedema becomes problematic.  3. Patient education regarding lymphedema 4. Continue follow-up with PCP 5. Referral to Genetics in [place] for BRCA1/2, PALB2, etc testing. 6. Screening Mammogram soon. 7. Return in 1 year.   THERAPY PLAN: From an oncology standpoint, her risk of breast cancer recurrence is nearly at the level of  anyone who has never had breast cancer.  She requests annual breast exams in the clinic.  I stressed the importance of annual mammogram.  Will refer for genetics testing as she has children and grandchildren and a breast cancer diagnosis in late 30's.   All questions were answered. The patient knows to call the clinic with any problems, questions or concerns. We can certainly see the patient much sooner if necessary.  Leanette Eutsler

## 2013-05-21 NOTE — Patient Instructions (Addendum)
[facility] Discharge Instructions  RECOMMENDATIONS MADE BY THE CONSULTANT AND ANY TEST RESULTS WILL BE SENT TO YOUR REFERRING PHYSICIAN.  EXAM FINDINGS BY THE PHYSICIAN TODAY AND SIGNS OR SYMPTOMS TO REPORT TO CLINIC OR PRIMARY PHYSICIAN: Exam and discussion by Dellis Anes, PA-C.  You are doing well.  MEDICATIONS PRESCRIBED:  none  INSTRUCTIONS GIVEN AND DISCUSSED: Report any new lumps, bone pain, shortness of breath or other symptoms.  SPECIAL INSTRUCTIONS/FOLLOW-UP: Go by xray and schedule your mammogram and we will call you about the appointment with genetic counseling.  Return in 1 year for follow-up.  Thank you for choosing [facility] to provide your oncology and hematology care.  To afford each patient quality time with our providers, please arrive at least 15 minutes before your scheduled appointment time.  With your help, our goal is to use those 15 minutes to complete the necessary work-up to ensure our physicians have the information they need to help with your evaluation and healthcare recommendations.    Effective January 1st, 2014, we ask that you re-schedule your appointment with our physicians should you arrive 10 or more minutes late for your appointment.  We strive to give you quality time with our providers, and arriving late affects you and other patients whose appointments are after yours.    Again, thank you for choosing [facility].  Our hope is that these requests will decrease the amount of time that you wait before being seen by our physicians.       _____________________________________________________________  Should you have questions after your visit to [facility], please contact our office at [phone] between the hours of 8:30 a.m. and 5:00 p.m.  Voicemails left after 4:30 p.m. will not be returned until the following business day.  For prescription refill requests, have your pharmacy contact  our office with your prescription refill request.

## 2013-05-22 LAB — HEMOGLOBIN A1C: Mean Plasma Glucose: 160 mg/dL — ABNORMAL HIGH (ref <117)

## 2013-05-25 ENCOUNTER — Ambulatory Visit (HOSPITAL_COMMUNITY)
Admission: RE | Admit: 2013-05-25 | Discharge: 2013-05-25 | Disposition: A | Discharge status: Home / Self Care | Payer: Medicare Other | Source: Ambulatory Visit | Attending: Oncology | Admitting: Oncology

## 2013-05-25 DIAGNOSIS — Z1231 Encounter for screening mammogram for malignant neoplasm of breast: Secondary | ICD-10-CM | POA: Insufficient documentation

## 2013-05-25 DIAGNOSIS — Z139 Encounter for screening, unspecified: Secondary | ICD-10-CM

## 2013-05-29 ENCOUNTER — Other Ambulatory Visit: Payer: Self-pay | Admitting: Family Medicine

## 2013-05-31 ENCOUNTER — Telehealth: Payer: Self-pay | Admitting: Family Medicine

## 2013-05-31 NOTE — Telephone Encounter (Signed)
This has already been refilled

## 2013-06-03 ENCOUNTER — Encounter: Payer: Self-pay | Admitting: Family Medicine

## 2013-06-03 ENCOUNTER — Ambulatory Visit (INDEPENDENT_AMBULATORY_CARE_PROVIDER_SITE_OTHER): Payer: Medicare Other | Admitting: Family Medicine

## 2013-06-03 VITALS — BP 160/92 | HR 85 | Resp 16 | Ht 66.5 in | Wt 201.1 lb

## 2013-06-03 DIAGNOSIS — E119 Type 2 diabetes mellitus without complications: Secondary | ICD-10-CM

## 2013-06-03 DIAGNOSIS — Z23 Encounter for immunization: Secondary | ICD-10-CM

## 2013-06-03 DIAGNOSIS — I10 Essential (primary) hypertension: Secondary | ICD-10-CM

## 2013-06-03 DIAGNOSIS — R5381 Other malaise: Secondary | ICD-10-CM

## 2013-06-03 DIAGNOSIS — E785 Hyperlipidemia, unspecified: Secondary | ICD-10-CM

## 2013-06-03 DIAGNOSIS — E1129 Type 2 diabetes mellitus with other diabetic kidney complication: Secondary | ICD-10-CM

## 2013-06-03 DIAGNOSIS — E669 Obesity, unspecified: Secondary | ICD-10-CM

## 2013-06-03 MED ORDER — GLIPIZIDE ER 5 MG PO TB24: ORAL_TABLET | ORAL | Status: DC

## 2013-06-03 MED ORDER — NEBIVOLOL HCL 10 MG PO TABS: ORAL_TABLET | ORAL | Status: DC

## 2013-06-03 NOTE — Patient Instructions (Addendum)
F/U in 3 month, call if you need me before  STOP metformin, new for blood sugar is glipizide 5mg  one tablet once daily at breakfast You need to test blood sugars once daily for the next 4 weeks , call if high  Goal for fasting blood sugar ranges from 80 to 120 and 2 hours after any meal or at bedtime should be between 130 to 170.   Flu vaccine today, microalb from office today  Fasting lipid, cmp and EGFr and HBA1C , TSH in 3 month, BEFORE visit  Blood pressure high since you are out of medication , please collect TODAY  It is important that you exercise regularly at least 30 minutes 5 times a week. If you develop chest pain, have severe difficulty breathing, or feel very tired, stop exercising immediately and seek medical attention  A healthy diet is rich in fruit, vegetables and whole grains. Poultry fish, nuts and beans are a healthy choice for protein rather then red meat. A low sodium diet and drinking 64 ounces of water daily is generally recommended. Oils and sweet should be limited. Carbohydrates especially for those who are diabetic or overweight, should be limited to 45 to 60 gram per meal. It is important to eat on a regular schedule, at least 3 times daily. Snacks should be primarily fruits, vegetables or nuts.

## 2013-06-03 NOTE — Assessment & Plan Note (Addendum)
uncontroled off blood pressure med, importance of daily compliance to reduce risk of complication stresses DASH diet and commitment to daily physical activity for a minimum of 30 minutes discussed and encouraged, as a part of hypertension management. The importance of attaining a healthy weight is also discussed.

## 2013-06-03 NOTE — Progress Notes (Signed)
  Subjective:    Patient ID: [name], female    DOB: 1946/04/15, 67 y.o.   MRN: [id number]  HPI The PT is here for follow up and re-evaluation of chronic medical conditions, medication management and review of any available recent lab and radiology data.  Preventive health is updated, specifically  Cancer screening and Immunization.   Has upcoming cardiology appt The PT denies any adverse reactions to current medications since the last visit. Compliance with medication adherence remains an issue, out of BP meds for over 1 week on day of visit. Not testing blood sugars regularly or sticking to lifestyle change to improve blood sugar There are no new concerns. States she "feels well" Denies polyuria, polydipsia or blurred vision There are no specific complaints       Review of Systems See HPI Denies recent fever or chills. Denies sinus pressure, nasal congestion, ear pain or sore throat. Denies chest congestion, productive cough or wheezing. Denies chest pains, palpitations and leg swelling Denies abdominal pain, nausea, vomiting,diarrhea or constipation.   Denies dysuria, frequency, hesitancy or incontinence. Denies uncontrolled  joint pain, swelling and limitation in mobility. Denies headaches, seizures, numbness, or tingling. Denies depression, anxiety or insomnia. Denies skin break down or rash.        Objective:   Physical Exam  Patient alert and oriented and in no cardiopulmonary distress.  HEENT: No facial asymmetry, EOMI, no sinus tenderness,  oropharynx pink and moist.  Neck supple no adenopathy.  Chest: Clear to auscultation bilaterally.  CVS: S1, S2 no murmurs, no S3.  ABD: Soft non tender. Bowel sounds normal.  Ext: No edema  MS: Adequate ROM spine, shoulders, hips and knees.  Skin: Intact, no ulcerations or rash noted.  Psych: Good eye contact, normal affect. Memory intact not anxious or depressed appearing.  CNS: CN 2-12 intact, power, tone and  sensation normal throughout.       Assessment & Plan:

## 2013-07-19 ENCOUNTER — Encounter: Payer: Self-pay | Admitting: Internal Medicine

## 2013-07-19 ENCOUNTER — Ambulatory Visit (INDEPENDENT_AMBULATORY_CARE_PROVIDER_SITE_OTHER): Payer: Medicare Other | Admitting: Internal Medicine

## 2013-07-19 VITALS — BP 158/94 | HR 74 | Ht 66.5 in | Wt 204.0 lb

## 2013-07-19 DIAGNOSIS — R0789 Other chest pain: Secondary | ICD-10-CM

## 2013-07-19 MED ORDER — LOSARTAN POTASSIUM 25 MG PO TABS: 25.0000 mg | ORAL_TABLET | Freq: Every day | ORAL | Status: DC

## 2013-07-19 NOTE — Progress Notes (Addendum)
HPI HPI  Patient is a 67 year old with a history of DOE, HTN, HL, DM IBS. She saw Wende Mott in clinic for preop evaluation in [phone]. Stress myoview was normal  Surgery went well. I saw her in Aug 2013.  At that time i had her pull back and stop amlodipine since she was dizzy at times  Since on norvasc balance has been off Doesn't feel good. Tired. She continues to complain of some dizziness.  Also some diarrhea.  Concerned that it may be due to taking Glipizide. She denies CP  She is not that active.   Allergies  Allergen Reactions  . Demerol [Meperidine] Other (See Comments)    Lost consciousness  . Statins Other (See Comments)    myalgia  . Ace Inhibitors Cough    Current Outpatient Prescriptions  Medication Sig Dispense Refill  . Cholecalciferol (VITAMIN D-3) 5000 UNITS TABS Take by mouth daily.      . cyanocobalamin 2000 MCG tablet Take 2,000 mcg by mouth daily.      . famotidine (PEPCID) 10 MG tablet Take 10 mg by mouth 2 (two) times daily as needed for heartburn.      . fish oil-omega-3 fatty acids 1000 MG capsule Take 2 g by mouth daily.      Marland Kitchen glucose blood test strip 1 each by Other route daily. Use as instructed       . Lactase (LACTAID PO) Take by mouth. TAKE AS NEEDED      . Loperamide HCl (IMODIUM PO) Take by mouth. TAKE AS NEEDED      . metFORMIN (GLUCOPHAGE) 1000 MG tablet TAKE ONE TABLET TWICE DAILY      . Mouthwashes (BIOTENE DRY MOUTH MT) Use as directed in the mouth or throat. USE DAILY FOR DRY MOUTH      . Multiple Vitamin (MULITIVITAMIN WITH MINERALS) TABS Take 1 tablet by mouth daily.      . nebivolol (BYSTOLIC) 10 MG tablet Two tablets twice daily  120 tablet  5  . ONE TOUCH LANCETS MISC by Does not apply route.        . pantoprazole (PROTONIX) 40 MG tablet TAKE ONE TABLET BY MOUTH DAILY.  30 tablet  2  . Polyethyl Glycol-Propyl Glycol (SYSTANE OP) Apply to eye. USE ON BOTH EYE AS NEEDED      . glipiZIDE (GLUCOTROL XL) 5 MG 24 hr tablet Take 5 mg by mouth  daily.      Marland Kitchen losartan (COZAAR) 25 MG tablet Take 1 tablet (25 mg total) by mouth daily.  30 tablet  12   No current facility-administered medications for this visit.    Past Medical History  Diagnosis Date  . GERD (gastroesophageal reflux disease)     mann  . Insomnia   . Hyperlipidemia   . Hypertension   . Obesity   . DJD (degenerative joint disease) of lumbar spine   . Degenerative disc disease, lumbar     pressing on L3 and L4  . IBS (irritable bowel syndrome)   . Hypersensitivity     in tongue  . Blood transfusion 1980's  . Hx: UTI (urinary tract infection)   . Complication of anesthesia     has a hard time waking up; can't lay flat  . PONV (postoperative nausea and vomiting)   . Dysrhythmia     "irregular"  . Heart murmur     "think I outgrew it"  . Shortness of breath     unable to  lie flat  . Exertional dyspnea   . Diabetes mellitus type 1 1989    Type 2 NIDDM; "cancer treatment gave me diabetes"  . 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 Anemia   . Kidney stones 2009    s/p lithotripsy  . Abdominal hernia 02/26/12    unrepaired  . History of stomach ulcers     Past Surgical History  Procedure Laterality Date  . Cesarean section  1973; 1978; 1981  . Urological surgery for blocked ureter secondary to kidney stone    . Appendectomy    . Implant and screws put in the right lower jaw  11/2008    dental surgery  . Eye surgery    . Ovarian cyst surgery    . Kidney blockage  ?1990's    " major surgery ;put kidney on pump for awhile"  . Parathyroidectomy  02/26/12  . Mastectomy  1997    right  . Abdominal hysterectomy  2002  . Cataract extraction w/ intraocular lens  implant, bilateral  2009-2010  . Back surgery    . Lumbar fusion  08/2010  . Breast biopsy      right  . Lithotripsy      "several times"  . Parathyroidectomy  02/26/2012    Procedure: PARATHYROIDECTOMY;  Surgeon: Darletta Moll, MD;  Location: [facility] OR;  Service:  ENT;;  . Spine surgery  2011    Family History  Problem Relation Age of Onset  . Heart attack Mother   . Cancer Brother     Esophageal   . Anesthesia problems Neg Hx     History   Social History  . Marital Status: Married    Spouse Name: N/A    Number of Children: 3  . Years of Education: N/A   Occupational History  . retired in 2011    Social History Main Topics  . Smoking status: Never Smoker   . Smokeless tobacco: Never Used  . Alcohol Use: No  . Drug Use: No  . Sexual Activity: Not Currently   Other Topics Concern  . Not on file   Social History Narrative  . No narrative on file    Review of Systems:  All systems reviewed.  They are negative to the above problem except as previously stated.  Vital Signs: BP 158/94  Pulse 74  Ht 5' 6.5" (1.689 m)  Wt 204 lb (92.534 kg)  BMI 32.44 kg/m2  Physical Exam Pateint is in NAD HEENT:  Normocephalic, atraumatic. EOMI, PERRLA.  Neck: JVP is normal.  No bruits.  Lungs: clear to auscultation. No rales no wheezes.  Heart: Regular rate and rhythm. Normal S1, S2. No S3.   No significant murmurs. PMI not displaced.  Abdomen:  Supple, nontender. Normal bowel sounds. No masses. No hepatomegaly.  Extremities:   Good distal pulses throughout. No lower extremity edema.  Musculoskeletal :moving all extremities.  Neuro:   alert and oriented x3.  CN II-XII grossly intact.  EKG  SR 74 bp  Assessment and Plan:  Patient is a 67 yo with Type II DM, HTN  No known CAD  1.  Dizziness  I am not sure what this represents.  BP is actually high  I would add Cozaar to regimen  Will need f/u  2.  HTN  As above in 1  3.  HL  She did not tolerate statins due to myalgias  Will need to  review recent labs.  4.  DM  Followd by Dr Lodema Hong  Patient to f/u in 10 days for BMET.

## 2013-07-19 NOTE — Patient Instructions (Signed)
Your physician wants you to follow-up in: 6 MONTHS WITH DR Tenny Craw You will receive a reminder letter in the mail two months in advance. If you don't receive a letter, please call our office to schedule the follow-up appointment.   START LOSARTAN 25 MG ONCE DAILY  Your physician recommends that you return for lab work in: 10 DAYS AT THE LAB IN [place]

## 2013-07-23 ENCOUNTER — Telehealth: Payer: Self-pay | Admitting: *Deleted

## 2013-07-23 DIAGNOSIS — E7800 Pure hypercholesterolemia, unspecified: Secondary | ICD-10-CM

## 2013-07-23 NOTE — Telephone Encounter (Signed)
Per dr Tenny Craw, Patient needs fasting lipid panel.  Left message for pt to call to schedule lipid with bmp next week.

## 2013-07-27 NOTE — Telephone Encounter (Signed)
Follow up    Returning [name]'s call

## 2013-07-27 NOTE — Telephone Encounter (Signed)
Left message for pt to call.

## 2013-07-28 NOTE — Telephone Encounter (Signed)
Follow up    Pt is returning your call, please give her a call back

## 2013-07-28 NOTE — Telephone Encounter (Signed)
Spoke with pt, she has paperwork for a bmp to have done. Will send the order for lipid to pt home and she will have cholesterol checked at the same time in [place]

## 2013-08-01 DIAGNOSIS — E1165 Type 2 diabetes mellitus with hyperglycemia: Secondary | ICD-10-CM | POA: Insufficient documentation

## 2013-08-01 NOTE — Assessment & Plan Note (Signed)
Uncontrolled, reports statin allergy as well as allergic to other lipid lowering agents tried in the past, fenofibrate and also niacin Hyperlipidemia:Low fat diet discussed and encouraged.  \

## 2013-08-01 NOTE — Assessment & Plan Note (Signed)
Deteriorated. Change in med dose. Increased compliance with diet , exercise and medication is discussed and encouraged. Patient advised to reduce carb and sweets, commit to regular physical activity, take meds as prescribed, test blood as directed, and attempt to lose weight, to improve blood sugar control.

## 2013-08-01 NOTE — Assessment & Plan Note (Signed)
Deteriorated. Patient advised to reduce carb and sweets, commit to regular physical activity, take meds as prescribed, test blood as directed, and attempt to lose weight, to improve blood sugar control. Med dose change

## 2013-08-01 NOTE — Assessment & Plan Note (Signed)
Deteriorated. Patient re-educated about  the importance of commitment to a  minimum of 150 minutes of exercise per week. The importance of healthy food choices with portion control discussed. Encouraged to start a food diary, count calories and to consider  joining a support group. Sample diet sheets offered. Goals set by the patient for the next several months.    

## 2013-08-10 ENCOUNTER — Encounter: Payer: Medicare Other | Admitting: Family Medicine

## 2013-08-31 ENCOUNTER — Ambulatory Visit: Payer: Medicare Other | Admitting: Family Medicine

## 2013-09-08 LAB — LIPID PANEL
HDL: 63 mg/dL (ref >39)
LDL Cholesterol: 118 mg/dL — ABNORMAL HIGH (ref 0–99)
Total CHOL/HDL Ratio: 3.2 Ratio
Triglycerides: 103 mg/dL (ref <150)
VLDL: 21 mg/dL (ref 0–40)

## 2013-09-08 LAB — HEMOGLOBIN A1C: Hgb A1c MFr Bld: 7.2 % — ABNORMAL HIGH (ref <5.7)

## 2013-09-08 LAB — COMPLETE METABOLIC PANEL WITH GFR
ALT: 24 U/L (ref 0–35)
AST: 25 U/L (ref 0–37)
Alkaline Phosphatase: 54 U/L (ref 39–117)
CO2: 28 mEq/L (ref 19–32)
Creat: 0.89 mg/dL (ref 0.50–1.10)
GFR, Est African American: 78 mL/min
Sodium: 140 mEq/L (ref 135–145)
Total Bilirubin: 0.6 mg/dL (ref 0.3–1.2)
Total Protein: 7.1 g/dL (ref 6.0–8.3)

## 2013-09-09 ENCOUNTER — Encounter: Payer: Self-pay | Admitting: Family Medicine

## 2013-09-09 ENCOUNTER — Encounter (INDEPENDENT_AMBULATORY_CARE_PROVIDER_SITE_OTHER): Payer: Self-pay

## 2013-09-09 ENCOUNTER — Ambulatory Visit (INDEPENDENT_AMBULATORY_CARE_PROVIDER_SITE_OTHER): Payer: Medicare Other | Admitting: Family Medicine

## 2013-09-09 VITALS — BP 148/72 | HR 76 | Resp 18 | Ht 66.5 in | Wt 202.0 lb

## 2013-09-09 DIAGNOSIS — R5381 Other malaise: Secondary | ICD-10-CM

## 2013-09-09 DIAGNOSIS — E1129 Type 2 diabetes mellitus with other diabetic kidney complication: Secondary | ICD-10-CM

## 2013-09-09 DIAGNOSIS — E785 Hyperlipidemia, unspecified: Secondary | ICD-10-CM

## 2013-09-09 DIAGNOSIS — C50919 Malignant neoplasm of unspecified site of unspecified female breast: Secondary | ICD-10-CM

## 2013-09-09 DIAGNOSIS — I10 Essential (primary) hypertension: Secondary | ICD-10-CM

## 2013-09-09 DIAGNOSIS — Z13820 Encounter for screening for osteoporosis: Secondary | ICD-10-CM

## 2013-09-09 DIAGNOSIS — E559 Vitamin D deficiency, unspecified: Secondary | ICD-10-CM

## 2013-09-09 DIAGNOSIS — D539 Nutritional anemia, unspecified: Secondary | ICD-10-CM

## 2013-09-09 DIAGNOSIS — E669 Obesity, unspecified: Secondary | ICD-10-CM

## 2013-09-09 NOTE — Progress Notes (Signed)
   Subjective:    Patient ID: [name], female    DOB: 06-19-1946, 67 y.o.   MRN: [id number]  HPI The PT is here for follow up and re-evaluation of chronic medical conditions, medication management and review of any available recent lab and radiology data.  Preventive health is updated, specifically  Cancer screening and Immunization.   Questions or concerns regarding consultations or procedures which the PT has had in the interim are  Addressed.Recently saw cardiologist , was started on losartan which she stopped about 1 week ago , attributing her fatigue an hair loss to either that medication or the glipizide Has concerns about the "safety" of glipizide versus metformin, has mild lightheadedness with the former, but her breakfast when she takes the medication has  no carb content most of the time. As far as her chronic diareah is concerned , this is no longer an issue off of metformin No regular exercise, deneis h/o excess snoring or breath holding while asleep      Review of Systems See HPI Denies recent fever or chills. Denies sinus pressure, nasal congestion, ear pain or sore throat. Denies chest congestion, productive cough or wheezing. Denies chest pains, palpitations and leg swelling Denies abdominal pain, nausea, vomiting,diarrhea or constipation.   Denies dysuria, frequency, hesitancy or incontinence. Denies joint pain, swelling and limitation in mobility. Denies headaches, seizures, numbness, or tingling. Denies depression, anxiety or insomnia. Denies skin break down or rash.        Objective:   Physical Exam  Patient alert and oriented and in no cardiopulmonary distress.  HEENT: No facial asymmetry, EOMI, no sinus tenderness,  oropharynx pink and moist.  Neck supple no adenopathy.  Chest: Clear to auscultation bilaterally.  CVS: S1, S2 no murmurs, no S3.  ABD: Soft non tender. Bowel sounds normal.  Ext: No edema  MS: Adequate ROM spine, shoulders,  hips and knees.  Skin: Intact, no ulcerations or rash noted.  Psych: Good eye contact, normal affect. Memory intact not anxious or depressed appearing.  CNS: CN 2-12 intact, power, tone and sensation normal throughout.       Assessment & Plan:

## 2013-09-09 NOTE — Patient Instructions (Addendum)
F/U in 4 month, call if you need me before   Resume the losartan, blood pressure is high   Start one iron 325mg  (OTC) one daily , may improve your nails  Take the glipizide with your lunch   Take stool softener(s) one to two daily to help to keep stool soft   Non fasting chem 7 and EGFr, hBA1C, CBC, iron and ferritin, Vit d in 4 month  pLEASE commit to of walking /riding every day   You are referred for a bone density test next year

## 2013-09-10 NOTE — Assessment & Plan Note (Signed)
Uncontrolled, pt to resume losartan ,she is aware of possible intolerance of cough and knows to call if this develops DASH diet and commitment to daily physical activity for a minimum of 30 minutes discussed and encouraged, as a part of hypertension management. The importance of attaining a healthy weight is also discussed.

## 2013-09-10 NOTE — Assessment & Plan Note (Signed)
Unchanged, no weight loss and no exercise contribute , she will work on both, needs sleep study but opts to defer at this time

## 2013-09-10 NOTE — Assessment & Plan Note (Signed)
Unchanged Patient advised to reduce carb and sweets, commit to regular physical activity, take meds as prescribed, test blood as directed, and attempt to lose weight, to improve blood sugar control.

## 2013-09-10 NOTE — Assessment & Plan Note (Signed)
Improved , applauded on this Statin intolerant, lifestyle only Not yet at goal

## 2013-09-10 NOTE — Assessment & Plan Note (Signed)
Updated mammogram in 2014 is normal

## 2013-09-10 NOTE — Assessment & Plan Note (Signed)
Unchanged. Patient re-educated about  the importance of commitment to a  minimum of 150 minutes of exercise per week. The importance of healthy food choices with portion control discussed. Encouraged to start a food diary, count calories and to consider  joining a support group. Sample diet sheets offered. Goals set by the patient for the next several months.    

## 2013-09-20 ENCOUNTER — Other Ambulatory Visit (HOSPITAL_COMMUNITY): Payer: Medicare Other

## 2013-09-28 ENCOUNTER — Ambulatory Visit (HOSPITAL_COMMUNITY)
Admission: RE | Admit: 2013-09-28 | Discharge: 2013-09-28 | Disposition: A | Discharge status: Home / Self Care | Payer: Medicare Other | Source: Ambulatory Visit | Attending: Family Medicine | Admitting: Family Medicine

## 2013-09-28 DIAGNOSIS — M949 Disorder of cartilage, unspecified: Principal | ICD-10-CM

## 2013-09-28 DIAGNOSIS — M899 Disorder of bone, unspecified: Secondary | ICD-10-CM | POA: Insufficient documentation

## 2013-09-28 DIAGNOSIS — Z853 Personal history of malignant neoplasm of breast: Secondary | ICD-10-CM | POA: Insufficient documentation

## 2013-09-28 DIAGNOSIS — Z13820 Encounter for screening for osteoporosis: Secondary | ICD-10-CM

## 2013-12-23 ENCOUNTER — Other Ambulatory Visit: Payer: Self-pay | Admitting: Family Medicine

## 2013-12-27 ENCOUNTER — Telehealth: Payer: Self-pay | Admitting: Family Medicine

## 2013-12-27 NOTE — Telephone Encounter (Signed)
Worked patient in tomorrow

## 2013-12-28 ENCOUNTER — Encounter: Payer: Self-pay | Admitting: Family Medicine

## 2013-12-28 ENCOUNTER — Encounter (INDEPENDENT_AMBULATORY_CARE_PROVIDER_SITE_OTHER): Payer: Self-pay

## 2013-12-28 ENCOUNTER — Ambulatory Visit (INDEPENDENT_AMBULATORY_CARE_PROVIDER_SITE_OTHER): Payer: Medicare Other | Admitting: Family Medicine

## 2013-12-28 VITALS — BP 140/80 | HR 79 | Resp 16 | Wt 210.0 lb

## 2013-12-28 DIAGNOSIS — E1129 Type 2 diabetes mellitus with other diabetic kidney complication: Secondary | ICD-10-CM

## 2013-12-28 DIAGNOSIS — M5430 Sciatica, unspecified side: Secondary | ICD-10-CM

## 2013-12-28 DIAGNOSIS — N3090 Cystitis, unspecified without hematuria: Secondary | ICD-10-CM

## 2013-12-28 DIAGNOSIS — E1165 Type 2 diabetes mellitus with hyperglycemia: Secondary | ICD-10-CM

## 2013-12-28 DIAGNOSIS — M5432 Sciatica, left side: Secondary | ICD-10-CM | POA: Insufficient documentation

## 2013-12-28 DIAGNOSIS — I10 Essential (primary) hypertension: Secondary | ICD-10-CM

## 2013-12-28 DIAGNOSIS — E785 Hyperlipidemia, unspecified: Secondary | ICD-10-CM

## 2013-12-28 LAB — POCT URINALYSIS DIPSTICK
Bilirubin, UA: NEGATIVE
Glucose, UA: NEGATIVE
Nitrite, UA: NEGATIVE
Protein, UA: 100
Spec Grav, UA: >=1.03
Urobilinogen, UA: 0.2
pH, UA: 6

## 2013-12-28 MED ORDER — CIPROFLOXACIN HCL 500 MG PO TABS: 500.0000 mg | ORAL_TABLET | Freq: Two times a day (BID) | ORAL | Status: DC

## 2013-12-28 MED ORDER — GABAPENTIN 100 MG PO CAPS: 100.0000 mg | ORAL_CAPSULE | Freq: Three times a day (TID) | ORAL | Status: DC

## 2013-12-28 NOTE — Patient Instructions (Addendum)
F/u as before  You have symptoms of sciatic nerve irritation for arthritis, stenosis and may bulging disc  MRI spine is ordered  Start for pain gabapentin 118m ONE at bedtime, after 3 days if not better increase to TWOa t bedtime, then after another 3 days OK to take three at bedtime Take tylenol 325 mg or 5054mone daily  Take one alleve once daily if you need   Labs today CBC, chem 7 and EGFR, hBA1C  Ciprofloxacin is  Sent in today for 3 days for presumed infection and we will follow up on this and get back in touch

## 2013-12-29 ENCOUNTER — Ambulatory Visit (HOSPITAL_COMMUNITY): Payer: Medicare Other

## 2013-12-29 ENCOUNTER — Telehealth: Payer: Self-pay | Admitting: Family Medicine

## 2013-12-29 LAB — BASIC METABOLIC PANEL
BUN: 12 mg/dL (ref 6–23)
CALCIUM: 10 mg/dL (ref 8.4–10.5)
CO2: 28 mEq/L (ref 19–32)
Chloride: 104 mEq/L (ref 96–112)
Creat: 0.97 mg/dL (ref 0.50–1.10)
Glucose, Bld: 89 mg/dL (ref 70–99)
Potassium: 3.9 mEq/L (ref 3.5–5.3)
SODIUM: 141 meq/L (ref 135–145)

## 2013-12-29 LAB — HEMOGLOBIN A1C
Hgb A1c MFr Bld: 7.5 % — ABNORMAL HIGH (ref <5.7)
MEAN PLASMA GLUCOSE: 169 mg/dL — AB (ref <117)

## 2013-12-29 LAB — CBC
HCT: 36.3 % (ref 36.0–46.0)
HEMOGLOBIN: 12 g/dL (ref 12.0–15.0)
MCH: 24.6 pg — ABNORMAL LOW (ref 26.0–34.0)
MCHC: 33.1 g/dL (ref 30.0–36.0)
MCV: 74.5 fL — ABNORMAL LOW (ref 78.0–100.0)
Platelets: 271 10*3/uL (ref 150–400)
RBC: 4.87 MIL/uL (ref 3.87–5.11)
RDW: 16.8 % — AB (ref 11.5–15.5)
WBC: 10.7 10*3/uL — ABNORMAL HIGH (ref 4.0–10.5)

## 2013-12-29 NOTE — Telephone Encounter (Signed)
I spoke directly with the patient since she missed the MRI you had scheduled this morning. She understands that you will reschedule her AGAIN (sorry) to get the MRI of her lumbar spine on 01/10/2014, as I will be out until then  She further understands that if she develops severe pain, weakness or numbness in the legs she needs to go to the ED  This is my FINAL message on this situation, THANKS for all that you do working as well  you do with patients and the various other issues .

## 2013-12-30 LAB — URINE CULTURE
Colony Count: NO GROWTH
Organism ID, Bacteria: NO GROWTH

## 2013-12-31 ENCOUNTER — Ambulatory Visit (HOSPITAL_COMMUNITY): Payer: Medicare Other

## 2014-01-10 ENCOUNTER — Encounter (HOSPITAL_COMMUNITY): Payer: Self-pay

## 2014-01-10 ENCOUNTER — Other Ambulatory Visit: Payer: Self-pay | Admitting: Family Medicine

## 2014-01-10 ENCOUNTER — Ambulatory Visit (HOSPITAL_COMMUNITY)
Admission: RE | Admit: 2014-01-10 | Discharge: 2014-01-10 | Disposition: A | Discharge status: Home / Self Care | Payer: Medicare Other | Source: Ambulatory Visit | Attending: Family Medicine | Admitting: Family Medicine

## 2014-01-10 DIAGNOSIS — M545 Low back pain, unspecified: Secondary | ICD-10-CM | POA: Insufficient documentation

## 2014-01-10 DIAGNOSIS — M5126 Other intervertebral disc displacement, lumbar region: Secondary | ICD-10-CM | POA: Insufficient documentation

## 2014-01-10 DIAGNOSIS — M5137 Other intervertebral disc degeneration, lumbosacral region: Secondary | ICD-10-CM | POA: Insufficient documentation

## 2014-01-10 DIAGNOSIS — M5432 Sciatica, left side: Secondary | ICD-10-CM

## 2014-01-10 DIAGNOSIS — N281 Cyst of kidney, acquired: Secondary | ICD-10-CM

## 2014-01-10 DIAGNOSIS — N189 Chronic kidney disease, unspecified: Secondary | ICD-10-CM

## 2014-01-10 DIAGNOSIS — M549 Dorsalgia, unspecified: Secondary | ICD-10-CM

## 2014-01-10 DIAGNOSIS — Z853 Personal history of malignant neoplasm of breast: Secondary | ICD-10-CM | POA: Insufficient documentation

## 2014-01-10 DIAGNOSIS — M51379 Other intervertebral disc degeneration, lumbosacral region without mention of lumbar back pain or lower extremity pain: Secondary | ICD-10-CM | POA: Insufficient documentation

## 2014-01-10 MED ORDER — GADOBENATE DIMEGLUMINE 529 MG/ML IV SOLN
20.0000 mL | Freq: Once | INTRAVENOUS | Status: AC | PRN
  Administered 2014-01-10: 20 mL via INTRAVENOUS

## 2014-01-10 MED ORDER — SODIUM CHLORIDE 0.9 % IV SOLN
INTRAVENOUS | Status: AC
  Filled 2014-01-10: qty 50

## 2014-01-11 ENCOUNTER — Telehealth: Payer: Self-pay | Admitting: Family Medicine

## 2014-01-11 NOTE — Telephone Encounter (Signed)
Me know about doing this

## 2014-01-13 NOTE — Telephone Encounter (Signed)
I spoke directly with the pt, she is noqw clear as to why she needs to have the renal US , as well as the fact her MRI will bwe sent to dr Nelva Bush for revieqw and management of her ongoing back pain  Please print MRI lumbar spine earluier this week, fax to Dr Nelva Bush with note I will give you and ensure his nurse receives it, thanks

## 2014-01-13 NOTE — Telephone Encounter (Signed)
Noted.  Sent to Dr. Nelva Bush.

## 2014-01-14 ENCOUNTER — Encounter: Payer: Self-pay | Admitting: *Deleted

## 2014-01-14 ENCOUNTER — Other Ambulatory Visit: Payer: Self-pay | Admitting: Family Medicine

## 2014-01-14 ENCOUNTER — Ambulatory Visit (HOSPITAL_COMMUNITY)
Admission: RE | Admit: 2014-01-14 | Discharge: 2014-01-14 | Disposition: A | Discharge status: Home / Self Care | Payer: Medicare Other | Source: Ambulatory Visit | Attending: Family Medicine | Admitting: Family Medicine

## 2014-01-14 DIAGNOSIS — N189 Chronic kidney disease, unspecified: Secondary | ICD-10-CM

## 2014-01-14 DIAGNOSIS — N289 Disorder of kidney and ureter, unspecified: Secondary | ICD-10-CM | POA: Insufficient documentation

## 2014-01-14 DIAGNOSIS — N281 Cyst of kidney, acquired: Secondary | ICD-10-CM

## 2014-01-14 DIAGNOSIS — Q619 Cystic kidney disease, unspecified: Secondary | ICD-10-CM | POA: Insufficient documentation

## 2014-01-18 ENCOUNTER — Other Ambulatory Visit: Payer: Self-pay | Admitting: Family Medicine

## 2014-01-18 DIAGNOSIS — N289 Disorder of kidney and ureter, unspecified: Secondary | ICD-10-CM

## 2014-01-18 DIAGNOSIS — N281 Cyst of kidney, acquired: Secondary | ICD-10-CM

## 2014-01-20 DIAGNOSIS — N3090 Cystitis, unspecified without hematuria: Secondary | ICD-10-CM | POA: Insufficient documentation

## 2014-01-20 NOTE — Progress Notes (Signed)
   Subjective:    Patient ID: [name], female    DOB: 05-14-1946, 68 y.o.   MRN: [id number]  HPI Pt in with main c/o low back pain radiaiitng down left leg since past 1 month in particular has worsened, but was seeing another physician ear;liuer this year for the problem.she states that her symptoms are worsened, and pain is making her feel awful. Does report some urinary frequency and malodor , denies fever , chills or flank pain. Blood nsugars are doing fairly well, states that off of metformin she has less diarrheah, however at times feels as though she may experience blood sugar falls, generally related to missing a meal or eating late.    Review of Systems    See HPI Denies recent fever or chills. Denies sinus pressure, nasal congestion, ear pain or sore throat. Denies chest congestion, productive cough or wheezing. Denies chest pains, palpitations and leg swelling Denies abdominal pain, nausea, vomiting,diarrhea or constipation.   Denies headaches, seizures, numbness, or tingling. Denies depression, anxiety or insomnia. Denies skin break down or rash.     Objective:   Physical Exam BP 140/80  Pulse 79  Resp 16  Wt 210 lb (95.255 kg)  SpO2 99% Patient alert and oriented and in no cardiopulmonary distress.Pt in pain  HEENT: No facial asymmetry, EOMI, no sinus tenderness,  oropharynx pink and moist.  Neck supple no adenopathy.  Chest: Clear to auscultation bilaterally.  CVS: S1, S2 no murmurs, no S3.  ABD: Soft non tender. Bowel sounds normal.  Ext: No edema  MS: decreased  ROM lumbar spine,, adequate in  shoulders, and knees.  Skin: Intact, no ulcerations or rash noted.  Psych: Good eye contact, normal affect. Memory intact mildly  anxious or depressed appearing.  CNS: CN 2-12 intact, power,  normal throughout.        Assessment & Plan:  Back pain with left-sided sciatica Uncontrolled back and lower extremity pain since past 3 months, worse in the  past month, past h/o back surgery , needs re imaging based on current complain of uncontrolled pain, I believe that her symptoms are due to nerve irritation for arthritis and possible disc disease. Urgent MRI ordered for next day imaging, will need re eval by neurosurgeon I believe  Cystitis Abnormal UA with back pain, will start antibiotic for presumed UTI with close f/u on c/s result  HYPERTENSION Sub optimal control, h/o intolerance in the past of several medications, no change at this time DASH diet and commitment to daily physical activity for a minimum of 30 minutes discussed and encouraged, as a part of hypertension management. The importance of attaining a healthy weight is also discussed.   Type II or unspecified type diabetes mellitus with renal manifestations, uncontrolled(250.42) Patient advised to reduce carb and sweets, commit to regular physical activity, take meds as prescribed, test blood as directed, and attempt to lose weight, to improve blood sugar control. Less well  Controlled, but adequate. Will hold on med change at this time  HYPERLIPIDEMIA Improved when last checked, pt is intolerant of all statins and lipid lowering theraPY  Hyperlipidemia:Low fat diet discussed and encouraged.

## 2014-01-20 NOTE — Assessment & Plan Note (Signed)
Sub optimal control, h/o intolerance in the past of several medications, no change at this time DASH diet and commitment to daily physical activity for a minimum of 30 minutes discussed and encouraged, as a part of hypertension management. The importance of attaining a healthy weight is also discussed.

## 2014-01-20 NOTE — Assessment & Plan Note (Signed)
Abnormal UA with back pain, will start antibiotic for presumed UTI with close f/u on c/s result

## 2014-01-20 NOTE — Assessment & Plan Note (Addendum)
Uncontrolled back and lower extremity pain since past 3 months, worse in the past month, past h/o back surgery , needs re imaging based on current complain of uncontrolled pain, I believe that her symptoms are due to nerve irritation for arthritis and possible disc disease. Urgent MRI ordered for next day imaging, will need re eval by neurosurgeon I believe

## 2014-01-20 NOTE — Assessment & Plan Note (Signed)
Patient advised to reduce carb and sweets, commit to regular physical activity, take meds as prescribed, test blood as directed, and attempt to lose weight, to improve blood sugar control. Less well  Controlled, but adequate. Will hold on med change at this time

## 2014-01-20 NOTE — Assessment & Plan Note (Signed)
Improved when last checked, pt is intolerant of all statins and lipid lowering theraPY  Hyperlipidemia:Low fat diet discussed and encouraged.

## 2014-01-25 ENCOUNTER — Other Ambulatory Visit: Payer: Self-pay | Admitting: Family Medicine

## 2014-01-26 ENCOUNTER — Ambulatory Visit (INDEPENDENT_AMBULATORY_CARE_PROVIDER_SITE_OTHER): Payer: Medicare Other | Admitting: Family Medicine

## 2014-01-26 ENCOUNTER — Encounter: Payer: Self-pay | Admitting: Family Medicine

## 2014-01-26 VITALS — BP 152/88 | HR 68 | Temp 98.0°F | Resp 16 | Ht 66.0 in | Wt 211.0 lb

## 2014-01-26 DIAGNOSIS — M5137 Other intervertebral disc degeneration, lumbosacral region: Secondary | ICD-10-CM

## 2014-01-26 DIAGNOSIS — M5126 Other intervertebral disc displacement, lumbar region: Secondary | ICD-10-CM

## 2014-01-26 DIAGNOSIS — K219 Gastro-esophageal reflux disease without esophagitis: Secondary | ICD-10-CM

## 2014-01-26 DIAGNOSIS — N281 Cyst of kidney, acquired: Secondary | ICD-10-CM

## 2014-01-26 DIAGNOSIS — I10 Essential (primary) hypertension: Secondary | ICD-10-CM

## 2014-01-26 DIAGNOSIS — R5381 Other malaise: Secondary | ICD-10-CM

## 2014-01-26 DIAGNOSIS — M51369 Other intervertebral disc degeneration, lumbar region without mention of lumbar back pain or lower extremity pain: Secondary | ICD-10-CM

## 2014-01-26 DIAGNOSIS — Q619 Cystic kidney disease, unspecified: Secondary | ICD-10-CM

## 2014-01-26 DIAGNOSIS — R5383 Other fatigue: Secondary | ICD-10-CM

## 2014-01-26 DIAGNOSIS — E1165 Type 2 diabetes mellitus with hyperglycemia: Secondary | ICD-10-CM

## 2014-01-26 DIAGNOSIS — M5136 Other intervertebral disc degeneration, lumbar region: Secondary | ICD-10-CM

## 2014-01-26 DIAGNOSIS — E1129 Type 2 diabetes mellitus with other diabetic kidney complication: Secondary | ICD-10-CM

## 2014-01-26 MED ORDER — TRAMADOL HCL 50 MG PO TABS: 50.0000 mg | ORAL_TABLET | Freq: Every evening | ORAL | Status: DC | PRN

## 2014-01-26 NOTE — Assessment & Plan Note (Signed)
Her blood pressures have been elevated the past couple months for repeat blood pressure was also elevated in the office. She's supposed to be on losartan 25 mg I will have her restart this if she has any side effects she can call. She will continue the bystolic at 20 mg twice a day as well.

## 2014-01-26 NOTE — Assessment & Plan Note (Addendum)
I will send her MRI on to Dr. ramus. She's had back surgery already. She may be a good candidate for an epidural injection. I've also given her tramadol to use in the evening for severe pain. She will continue Tylenol.

## 2014-01-26 NOTE — Assessment & Plan Note (Signed)
She seems to have chronic fatigue which is likely multifactorial. She is deconditioned she also has multiple comorbidities. I do not think that there will be a single thing to improve her fatigue and stamina

## 2014-01-26 NOTE — Assessment & Plan Note (Signed)
Per above 

## 2014-01-26 NOTE — Patient Instructions (Addendum)
Restart the losatan Try the ultram  For pain at bedtime I will send MRI to Dr. Nelva Bush- to see if epidural injection would help Try the Januvia for diabetes Check your sugar at least 3 times a week F/U 6 weeks

## 2014-01-26 NOTE — Assessment & Plan Note (Signed)
A1c is 7.5%. She does not want to be on glipizide states it is causing too many side effects. We'll discontinue the glipizide as given her Januvia 100 mg once a day her GFR is greater than 50. I will have her try this for the next month samples were provided

## 2014-01-26 NOTE — Progress Notes (Signed)
Patient ID: [name], female   DOB: 05/17/1946, 68 y.o.   MRN: [id number]   Subjective:    Patient ID: [name], female    DOB: [birth year], 68 y.o.   MRN: [id number]  Patient presents for CPE and Generalized pain  Patient here to establish care. Have actually seen her once before in my previous practice in [facility]. Her previous PCP was Dr. Tula Nakayama. She has some other family members that I see therefore she was to switch to my office. She has a very complex chronic medical history which are reviewed. She has history of breast cancer which resulted in right side mastectomy she is now under general surveillance. She's not on any active treatment. As a result of her chemotherapy of breast cancer treatment she developed high blood pressure as well as diabetes mellitus type 2. She states she was initially on metformin however this caused significant diarrhea she was switched to glipizide back in 2014 since being on this medication she's been fatigued and has not felt well on it. Her last A1c was 7.5% approximately 4 weeks ago. She was to try something different for her diabetes. During that time as well she was being seen by cardiology secondary to chest pain and blood pressure problems. She was placed on losartan around the same time as the glipizide she began to have more fatigue and felt like her hair was coming out therefore she stopped the losartan she was supposed to restart this after noted that there was no change from not being on this medication but she has not in her blood pressure is also been elevated for the past couple months. Her blood sugars typically around 150 she states she does not take these on a regular basis because she does not like needles. She's not had any hypoglycemia symptoms.  Chronic pain over the past 3 months she's had increasing back pain leg pain. She did have an MRI which showed arthritis as well as bulging disc and some degenerative  changes. She was seen by Dr. Dossie Der in [place] for pain management at that time he can advise her to start Aleve or Tylenol however she did not have the MRI yet. She's not sure he says she is to review the MRIs tell her the next step regarding her back pain but she gets to the point where she cannot get out of bed on bad days because of her back and leg pain. She typically has pain that radiates down the left leg.  Flank pain she's also had bilateral flank pain she did have a urinalysis done a few weeks ago that was suggestive of urinary tract there was blood in the urine as well as leukocytes however the urine culture showed no growth. She took 2 days of ciprofloxacin. She has followup with Dr. Kellie Simmering urology tomorrow as she does have a history of kidney stones. Her MRI also showed an enlarging right cyst and ultrasound showed a small cystic lesion on the left upper pole of her kidney as well.    Review Of Systems:  GEN- + fatigue, fever, weight loss,weakness, recent illness HEENT- denies eye drainage, change in vision, nasal discharge, CVS- denies chest pain, palpitations RESP- denies SOB, cough, wheeze ABD- denies N/V, change in stools, +abd pain GU- denies dysuria, hematuria, dribbling, incontinence MSK- + joint pain, muscle aches, injury Neuro- denies headache, dizziness, syncope, seizure activity       Objective:    BP 152/88  Pulse 68  Temp(Src) 98 F (36.7 C) (Oral)  Resp 16  Ht 5\' 6"  (1.676 m)  Wt 211 lb (95.709 kg)  BMI 34.07 kg/m2 GEN- NAD, alert and oriented x3, well appearing HEENT- PERRL, EOMI, non injected sclera, pink conjunctiva, MMM, oropharynx clear Neck- Supple,  CVS- RRR, no murmur RESP-CTAB ABD-NABS,soft,NT,ND, no CVA tenderness, large ventral hernia, bloated appearance to abdomen MSK- Mild TTP Lumbar spine, neg SLR bilat, Decreased ROM Upper and Lower ext, antalgic gait EXT- No edema Pulses- Radial, DP- 2+        Assessment & Plan:      Problem  List Items Addressed This Visit   None      Note: This dictation was prepared with Dragon dictation along with smaller phrase technology. Any transcriptional errors that result from this process are unintentional.

## 2014-01-26 NOTE — Assessment & Plan Note (Signed)
Bilateral renal cysts she has an appointment with  Urology tomorrow

## 2014-01-28 ENCOUNTER — Ambulatory Visit (INDEPENDENT_AMBULATORY_CARE_PROVIDER_SITE_OTHER): Payer: Medicare Other | Admitting: Internal Medicine

## 2014-01-28 ENCOUNTER — Encounter: Payer: Self-pay | Admitting: Internal Medicine

## 2014-01-28 VITALS — BP 128/72 | HR 80 | Ht 66.0 in | Wt 210.0 lb

## 2014-01-28 DIAGNOSIS — I10 Essential (primary) hypertension: Secondary | ICD-10-CM

## 2014-01-28 NOTE — Progress Notes (Signed)
HPI HPI  Patient is a 68 year old with a history of DOE, HTN, HL, DM IBS.  Stress myoview 2013 was normal  Had problems with normavasc in past because of dizziness She was last in clinic in September 2013. She denies CP  Breathing is steady.  BP OK  Allergies  Allergen Reactions  . Demerol [Meperidine] Other (See Comments)    Lost consciousness  . Statins Other (See Comments)    myalgia  . Ace Inhibitors Cough    Current Outpatient Prescriptions  Medication Sig Dispense Refill  . acetaminophen (TYLENOL) 325 MG tablet Take 650 mg by mouth every 6 (six) hours as needed.      Marland Kitchen BYSTOLIC 10 MG tablet TAKE 2 TABLETS EACH MORNING AND 2 TABLETS AT BEDTIME.  120 tablet  0  . CALCIUM & MAGNESIUM CARBONATES PO Take 1 tablet by mouth daily.      . Cholecalciferol (VITAMIN D-3) 5000 UNITS TABS Take by mouth daily.      . cyanocobalamin 2000 MCG tablet Take 2,000 mcg by mouth daily.      . famotidine (PEPCID) 10 MG tablet Take 10 mg by mouth 2 (two) times daily as needed for heartburn.      . fish oil-omega-3 fatty acids 1000 MG capsule Take 2 g by mouth daily.      Marland Kitchen glucose blood test strip 1 each by Other route daily. Use as instructed       . Lactase (LACTAID PO) Take by mouth. TAKE AS NEEDED      . Loperamide HCl (IMODIUM PO) Take by mouth. TAKE AS NEEDED      . losartan (COZAAR) 25 MG tablet Take 1 tablet (25 mg total) by mouth daily.  30 tablet  12  . Mouthwashes (BIOTENE DRY MOUTH MT) Use as directed in the mouth or throat. USE DAILY FOR DRY MOUTH      . Multiple Vitamin (MULITIVITAMIN WITH MINERALS) TABS Take 1 tablet by mouth daily.      . naproxen sodium (ANAPROX) 220 MG tablet Take 220 mg by mouth 2 (two) times daily with a meal.      . ONE TOUCH LANCETS MISC by Does not apply route.        Vladimir Faster Glycol-Propyl Glycol (SYSTANE OP) Apply to eye. USE ON BOTH EYE AS NEEDED      . sitaGLIPtin (JANUVIA) 100 MG tablet Take 100 mg by mouth daily.      . traMADol (ULTRAM) 50 MG tablet  Take 1 tablet (50 mg total) by mouth at bedtime as needed.  30 tablet  1   No current facility-administered medications for this visit.    Past Medical History  Diagnosis Date  . GERD (gastroesophageal reflux disease)     mann  . Insomnia   . Hyperlipidemia   . Hypertension   . Obesity   . DJD (degenerative joint disease) of lumbar spine   . Degenerative disc disease, lumbar     pressing on L3 and L4  . IBS (irritable bowel syndrome)   . Hypersensitivity     in tongue  . Blood transfusion 1980's  . Hx: UTI (urinary tract infection)   . Complication of anesthesia     has a hard time waking up; can't lay flat  . PONV (postoperative nausea and vomiting)   . Dysrhythmia     "irregular"  . Heart murmur     "think I outgrew it"  . Shortness of breath  unable to lie flat  . Exertional dyspnea   . 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 Anemia   . Kidney stones 2009    s/p lithotripsy  . Abdominal hernia 02/26/12    unrepaired  . History of stomach ulcers   . Diabetes mellitus 1989    Type 2 NIDDM; "cancer treatment gave me diabetes"    Past Surgical History  Procedure Laterality Date  . Cesarean section  1973; 1978; 1981  . Urological surgery for blocked ureter secondary to kidney stone    . Appendectomy    . Implant and screws put in the right lower jaw  11/2008    dental surgery  . Eye surgery    . Ovarian cyst surgery    . Kidney blockage  ?1990's    " major surgery ;put kidney on pump for awhile"  . Parathyroidectomy  02/26/12  . Mastectomy  1997    right  . Abdominal hysterectomy  2002  . Cataract extraction w/ intraocular lens  implant, bilateral  2009-2010  . Back surgery    . Lumbar fusion  08/2010  . Breast biopsy      right  . Lithotripsy      "several times"  . Parathyroidectomy  02/26/2012    Procedure: PARATHYROIDECTOMY;  Surgeon: Ascencion Dike, MD;  Location: [facility] OR;  Service: ENT;;  . Spine surgery  2011    Family  History  Problem Relation Age of Onset  . Heart attack Mother   . Cancer Brother     Esophageal   . Anesthesia problems Neg Hx     History   Social History  . Marital Status: Married    Spouse Name: N/A    Number of Children: 3  . Years of Education: N/A   Occupational History  . retired in [place]  . Smoking status: Never Smoker   . Smokeless tobacco: Never Used  . Alcohol Use: 1.2 oz/week    2 Glasses of wine per week  . Drug Use: No  . Sexual Activity: Not Currently   Other Topics Concern  . Not on file   Social History Narrative  . No narrative on file    Review of Systems:  All systems reviewed.  They are negative to the above problem except as previously stated.  Vital Signs: BP 128/72  Pulse 80  Ht 5\' 6"  (1.676 m)  Wt 210 lb (95.255 kg)  BMI 33.91 kg/m2  Physical Exam Pateint is in NAD HEENT:  Normocephalic, atraumatic. EOMI, PERRLA.  Neck: JVP is normal.  No bruits.  Lungs: clear to auscultation. No rales no wheezes.  Heart: Regular rate and rhythm. Normal S1, S2. No S3.   No significant murmurs. PMI not displaced.  Abdomen:  Supple, nontender. Normal bowel sounds. No masses. No hepatomegaly.  Extremities:   Good distal pulses throughout. No lower extremity edema.  Musculoskeletal :moving all extremities.  Neuro:   alert and oriented x3.  CN II-XII grossly intact.  EKG  SR 74 bp  Assessment and Plan:  Patient is a 68 yo with Type II DM, HTN  No known CAD  1.    HTN  Adquate control    Keep on current regmien    2.  HL  She did not tolerate statins due to myalgias  Review labs    3.  DM

## 2014-01-28 NOTE — Patient Instructions (Signed)
Your physician recommends that you continue on your current medications as directed. Please refer to the Current Medication list given to you today.  Your physician wants you to follow-up in: February 2016 with Dr. Harrington Challenger.   You will receive a reminder letter in the mail two months in advance. If you don't receive a letter, please call our office to schedule the follow-up appointment.

## 2014-02-09 ENCOUNTER — Other Ambulatory Visit: Payer: Self-pay | Admitting: Family Medicine

## 2014-02-23 ENCOUNTER — Telehealth: Payer: Self-pay | Admitting: Family Medicine

## 2014-02-23 MED ORDER — SITAGLIPTIN PHOSPHATE 100 MG PO TABS: 100.0000 mg | ORAL_TABLET | Freq: Every day | ORAL | Status: DC

## 2014-02-23 NOTE — Telephone Encounter (Signed)
Prescription sent to pharmacy.   Call placed to patient and patient made aware.   Reports that se has been seen by [facility] and has had CT scan. Requested notes from Urologist.

## 2014-02-23 NOTE — Telephone Encounter (Signed)
[phone] Pt is needing a refill on [facility]   -pt is also wanting to know test results

## 2014-03-08 ENCOUNTER — Ambulatory Visit: Payer: Medicare Other | Admitting: Family Medicine

## 2014-03-09 ENCOUNTER — Ambulatory Visit: Payer: Medicare Other | Admitting: Family Medicine

## 2014-03-18 ENCOUNTER — Encounter: Payer: Self-pay | Admitting: Family Medicine

## 2014-03-18 ENCOUNTER — Ambulatory Visit (INDEPENDENT_AMBULATORY_CARE_PROVIDER_SITE_OTHER): Payer: Medicare Other | Admitting: Family Medicine

## 2014-03-18 VITALS — BP 138/76 | HR 76 | Temp 98.3°F | Resp 14 | Ht 66.5 in | Wt 205.0 lb

## 2014-03-18 DIAGNOSIS — E1165 Type 2 diabetes mellitus with hyperglycemia: Secondary | ICD-10-CM

## 2014-03-18 DIAGNOSIS — N200 Calculus of kidney: Secondary | ICD-10-CM

## 2014-03-18 DIAGNOSIS — E1129 Type 2 diabetes mellitus with other diabetic kidney complication: Secondary | ICD-10-CM

## 2014-03-18 DIAGNOSIS — I10 Essential (primary) hypertension: Secondary | ICD-10-CM

## 2014-03-18 DIAGNOSIS — E785 Hyperlipidemia, unspecified: Secondary | ICD-10-CM

## 2014-03-18 MED ORDER — TRAMADOL HCL 50 MG PO TABS: 50.0000 mg | ORAL_TABLET | Freq: Every evening | ORAL | Status: DC | PRN

## 2014-03-18 NOTE — Assessment & Plan Note (Signed)
A repeat A1c in about 2 weeks. She'll continue the Januvia for right now she is also on losartan No aspirin products at this time we will need to hold it anyway secondary to her surgery and she still has some hematuria due to the kidney stone

## 2014-03-18 NOTE — Assessment & Plan Note (Signed)
Kidney stone noted on the left side I agree that surgical intervention is needed I discussed this with the patient. I refilled her tramadol for pain. She will hold her aspirin products for right now.

## 2014-03-18 NOTE — Patient Instructions (Signed)
I will sign off for surgery  Ultram for pain No Aspirin or aleve  Continue all other medications New blood glucose meter Get the labs done with your preop F/U 3 months

## 2014-03-18 NOTE — Assessment & Plan Note (Signed)
Blood pressure is much improved from a cardiac standpoint I think that she can proceed with surgical intervention

## 2014-03-18 NOTE — Progress Notes (Signed)
Patient ID: [name], female   DOB: 1946/07/28, 68 y.o.   MRN: [id number]   Subjective:    Patient ID: [name], female    DOB: Nov 16, 1945, 68 y.o.   MRN: [id number]  Patient presents for 6 week F/U, Discuss notes from nephrologist and schedule annual wellness check  Patient here to followup from last visit. She has a large kidney stone on the left side which is required surgical intervention she continues to have a lot of flank pain on that side as well as blood in the urine. She was seen by her cardiologist recently she's not had any chest pain or shortness of breath. Her blood pressures also been under better control with the losartan.  Diabetes she does not check her blood sugars were read with a states she does not like needles her last A1c was 7.5% in April she was changed to [place] has not had any difficulties with medications.     Review Of Systems:  GEN- denies fatigue, fever, weight loss,weakness, recent illness HEENT- denies eye drainage, change in vision, nasal discharge, CVS- denies chest pain, palpitations RESP- denies SOB, cough, wheeze ABD- denies N/V, change in stools, abd pain GU- denies dysuria,+ hematuria, dribbling, incontinence MSK- denies joint pain, muscle aches, injury Neuro- denies headache, dizziness, syncope, seizure activity       Objective:    BP 138/76  Pulse 76  Temp(Src) 98.3 F (36.8 C) (Oral)  Resp 14  Ht 5' 6.5" (1.689 m)  Wt 205 lb (92.987 kg)  BMI 32.60 kg/m2 GEN- NAD, alert and oriented x3, well appearing HEENT- PERRL, EOMI, non injected sclera, pink conjunctiva, MMM, oropharynx clear Neck- Supple,  CVS- RRR, no murmur RESP-CTAB ABD-NABS,soft,NT,ND,  EXT- No edema Pulses- Radial 2+        Assessment & Plan:      Problem List Items Addressed This Visit   Type II or unspecified type diabetes mellitus with renal manifestations, uncontrolled(250.42) - Primary   Relevant Orders      Hemoglobin A1c   HYPERTENSION    HYPERLIPIDEMIA   Relevant Orders      Lipid panel      Note: This dictation was prepared with Dragon dictation along with smaller phrase technology. Any transcriptional errors that result from this process are unintentional.

## 2014-03-19 ENCOUNTER — Other Ambulatory Visit: Payer: Self-pay | Admitting: Family Medicine

## 2014-03-23 ENCOUNTER — Other Ambulatory Visit: Payer: Self-pay | Admitting: Urology

## 2014-03-23 ENCOUNTER — Telehealth: Payer: Self-pay | Admitting: Family Medicine

## 2014-03-23 MED ORDER — NEBIVOLOL HCL 10 MG PO TABS: ORAL_TABLET | ORAL | Status: DC

## 2014-03-23 NOTE — Telephone Encounter (Signed)
Patient needs refill on bystolic if possible she is out  [place]  [phone]  If questions

## 2014-04-05 ENCOUNTER — Other Ambulatory Visit (HOSPITAL_COMMUNITY): Payer: Self-pay | Admitting: *Deleted

## 2014-04-05 NOTE — Patient Instructions (Addendum)
[name]  04/05/2014                           YOUR PROCEDURE IS SCHEDULED ON: 04/13/14 AT 7:30 AM               ENTER [facility] ENTRANCE AND                            FOLLOW  SIGNS TO SHORT STAY CENTER                 ARRIVE AT SHORT STAY AT: 5:30 AM               CALL THIS NUMBER IF ANY PROBLEMS THE DAY OF SURGERY :               [phone]                                REMEMBER:   Do not eat food or drink liquids AFTER MIDNIGHT                 Take these medicines the morning of surgery with               A SIPS OF WATER : BYSTOLIC / TRAMADOL         Do not wear jewelry, make-up   Do not wear lotions, powders, or perfumes.   Do not shave legs or underarms 12 hrs. before surgery (men may shave face)  Do not bring valuables to the hospital.  Contacts, dentures or bridgework may not be worn into surgery.  Leave suitcase in the car. After surgery it may be brought to your room.                                                      The day of discharge.   Patients discharged the day of surgery will not be allowed to drive home.            If going home same day of surgery, must have someone stay with you              FIRST 24 hrs at home and arrange for some one to drive you              home from hospital.   ________________________________________________________________________                                                                        [place]  Before surgery, you can play an important role.  Because skin is not sterile, your skin needs to be as free of germs as possible.  You can reduce the number of germs on your skin by washing with CHG (chlorahexidine gluconate) soap before surgery.  CHG is an antiseptic cleaner which kills germs and bonds with the skin to continue  killing germs even after washing. Please DO NOT use if you have an allergy to CHG or antibacterial soaps.  If your skin  becomes reddened/irritated stop using the CHG and inform your nurse when you arrive at Short Stay. Do not shave (including legs and underarms) for at least 48 hours prior to the first CHG shower.  You may shave your face. Please follow these instructions carefully:   1.  Shower with CHG Soap the night before surgery and the  morning of Surgery.   2.  If you choose to wash your hair, wash your hair first as usual with your  normal  Shampoo.   3.  After you shampoo, rinse your hair and body thoroughly to remove the  shampoo.                                         4.  Use CHG as you would any other liquid soap.  You can apply chg directly  to the skin and wash . Gently wash with scrungie or clean wascloth    5.  Apply the CHG Soap to your body ONLY FROM THE NECK DOWN.   Do not use on open                           Wound or open sores. Avoid contact with eyes, ears mouth and genitals (private parts).                        Genitals (private parts) with your normal soap.              6.  Wash thoroughly, paying special attention to the area where your surgery  will be performed.   7.  Thoroughly rinse your body with warm water from the neck down.   8.  DO NOT shower/wash with your normal soap after using and rinsing off  the CHG Soap .                9.  Pat yourself dry with a clean towel.             10.  Wear clean pajamas.             11.  Place clean sheets on your bed the night of your first shower and do not  sleep with pets.  Day of Surgery : Do not apply any lotions/deodorants the morning of surgery.  Please wear clean clothes to the hospital/surgery center.  FAILURE TO FOLLOW THESE INSTRUCTIONS MAY RESULT IN THE CANCELLATION OF YOUR SURGERY    PATIENT SIGNATURE_________________________________  ______________________________________________________________________    WHAT IS A BLOOD TRANSFUSION? Blood Transfusion Information  A transfusion is the replacement of blood  or some of its parts. Blood is made up of multiple cells which provide different functions.  Red blood cells carry oxygen and are used for blood loss replacement.  White blood cells fight against infection.  Platelets control bleeding.  Plasma helps clot blood.  Other blood products are available for specialized needs, such as hemophilia or other clotting disorders. BEFORE THE TRANSFUSION  Who gives blood for transfusions?   Healthy volunteers who are fully evaluated to make sure their blood is safe. This is blood bank blood. Transfusion therapy is the safest it has ever been in the practice of  medicine. Before blood is taken from a donor, a complete history is taken to make sure that person has no history of diseases nor engages in risky social behavior (examples are intravenous drug use or sexual activity with multiple partners). The donor's travel history is screened to minimize risk of transmitting infections, such as malaria. The donated blood is tested for signs of infectious diseases, such as HIV and hepatitis. The blood is then tested to be sure it is compatible with you in order to minimize the chance of a transfusion reaction. If you or a relative donates blood, this is often done in anticipation of surgery and is not appropriate for emergency situations. It takes many days to process the donated blood. RISKS AND COMPLICATIONS Although transfusion therapy is very safe and saves many lives, the main dangers of transfusion include:   Getting an infectious disease.  Developing a transfusion reaction. This is an allergic reaction to something in the blood you were given. Every precaution is taken to prevent this. The decision to have a blood transfusion has been considered carefully by your caregiver before blood is given. Blood is not given unless the benefits outweigh the risks. AFTER THE TRANSFUSION  Right after receiving a blood transfusion, you will usually feel much better and  more energetic. This is especially true if your red blood cells have gotten low (anemic). The transfusion raises the level of the red blood cells which carry oxygen, and this usually causes an energy increase.  The nurse administering the transfusion will monitor you carefully for complications. HOME CARE INSTRUCTIONS  No special instructions are needed after a transfusion. You may find your energy is better. Speak with your caregiver about any limitations on activity for underlying diseases you may have. SEEK MEDICAL CARE IF:   Your condition is not improving after your transfusion.  You develop redness or irritation at the intravenous (IV) site. SEEK IMMEDIATE MEDICAL CARE IF:  Any of the following symptoms occur over the next 12 hours:  Shaking chills.  You have a temperature by mouth above 102 F (38.9 C), not controlled by medicine.  Chest, back, or muscle pain.  People around you feel you are not acting correctly or are confused.  Shortness of breath or difficulty breathing.  Dizziness and fainting.  You get a rash or develop hives.  You have a decrease in urine output.  Your urine turns a dark color or changes to pink, red, or brown. Any of the following symptoms occur over the next 10 days:  You have a temperature by mouth above 102 F (38.9 C), not controlled by medicine.  Shortness of breath.  Weakness after normal activity.  The white part of the eye turns yellow (jaundice).  You have a decrease in the amount of urine or are urinating less often.  Your urine turns a dark color or changes to pink, red, or brown. Document Released: 09/06/2000 Document Revised: 12/02/2011 Document Reviewed: 04/25/2008 [facility] Patient Information 2014 [place], [place].  _______________________________________________________________________

## 2014-04-06 ENCOUNTER — Encounter (HOSPITAL_COMMUNITY): Payer: Self-pay | Admitting: Pharmacy Technician

## 2014-04-06 ENCOUNTER — Encounter (HOSPITAL_COMMUNITY): Payer: Self-pay

## 2014-04-06 ENCOUNTER — Encounter (HOSPITAL_COMMUNITY)
Admission: RE | Admit: 2014-04-06 | Discharge: 2014-04-06 | Disposition: A | Discharge status: Home / Self Care | Payer: Medicare Other | Source: Ambulatory Visit | Attending: Urology | Admitting: Urology

## 2014-04-06 DIAGNOSIS — Z01818 Encounter for other preprocedural examination: Secondary | ICD-10-CM | POA: Insufficient documentation

## 2014-04-06 DIAGNOSIS — Z01812 Encounter for preprocedural laboratory examination: Secondary | ICD-10-CM | POA: Insufficient documentation

## 2014-04-06 HISTORY — DX: Cyst of kidney, acquired: N28.1

## 2014-04-06 LAB — CBC
HCT: 36.1 % (ref 36.0–46.0)
HEMOGLOBIN: 12 g/dL (ref 12.0–15.0)
MCH: 25.7 pg — ABNORMAL LOW (ref 26.0–34.0)
MCHC: 33.2 g/dL (ref 30.0–36.0)
MCV: 77.3 fL — ABNORMAL LOW (ref 78.0–100.0)
Platelets: 236 10*3/uL (ref 150–400)
RBC: 4.67 MIL/uL (ref 3.87–5.11)
RDW: 15.4 % (ref 11.5–15.5)
WBC: 10.3 10*3/uL (ref 4.0–10.5)

## 2014-04-06 LAB — BASIC METABOLIC PANEL
ANION GAP: 12 (ref 5–15)
BUN: 8 mg/dL (ref 6–23)
CHLORIDE: 102 meq/L (ref 96–112)
CO2: 26 meq/L (ref 19–32)
Calcium: 10.2 mg/dL (ref 8.4–10.5)
Creatinine, Ser: 0.85 mg/dL (ref 0.50–1.10)
GFR calc Af Amer: 80 mL/min — ABNORMAL LOW (ref >90)
GFR calc non Af Amer: 69 mL/min — ABNORMAL LOW (ref >90)
Glucose, Bld: 125 mg/dL — ABNORMAL HIGH (ref 70–99)
Potassium: 4 mEq/L (ref 3.7–5.3)
SODIUM: 140 meq/L (ref 137–147)

## 2014-04-07 LAB — URINE CULTURE

## 2014-04-12 NOTE — Anesthesia Preprocedure Evaluation (Signed)
Anesthesia Evaluation  Patient identified by MRN, date of birth, ID band  Reviewed: Allergy & Precautions, H&P , NPO status , Patient's Chart, lab work & pertinent test results, reviewed documented beta blocker date and time   History of Anesthesia Complications (+) PONV, PROLONGED EMERGENCE and history of anesthetic complications  Airway Mallampati: III TM Distance: >3 FB Neck ROM: Full    Dental no notable dental hx. (+) Edentulous Upper, Dental Advisory Given   Pulmonary shortness of breath,  breath sounds clear to auscultation  Pulmonary exam normal       Cardiovascular hypertension, On Home Beta Blockers + dysrhythmias + Valvular Problems/Murmurs Rhythm:Regular Rate:Normal     Neuro/Psych PSYCHIATRIC DISORDERS negative neurological ROS     GI/Hepatic Neg liver ROS, GERD-  Poorly Controlled,  Endo/Other  diabetes, Type 2, Oral Hypoglycemic Agents  Renal/GU Renal disease  negative genitourinary   Musculoskeletal   Abdominal   Peds  Hematology negative hematology ROS (+) anemia ,   Anesthesia Other Findings   Reproductive/Obstetrics negative OB ROS                           Anesthesia Physical  Anesthesia Plan  ASA: III  Anesthesia Plan: General   Post-op Pain Management:    Induction: Intravenous  Airway Management Planned: Oral ETT  Additional Equipment:   Intra-op Plan:   Post-operative Plan: Extubation in OR  Informed Consent: I have reviewed the patients History and Physical, chart, labs and discussed the procedure including the risks, benefits and alternatives for the proposed anesthesia with the patient or authorized representative who has indicated his/her understanding and acceptance.   Dental advisory given  Plan Discussed with: CRNA  Anesthesia Plan Comments:         Anesthesia Quick Evaluation

## 2014-04-13 ENCOUNTER — Encounter (HOSPITAL_COMMUNITY): Payer: Medicare Other | Admitting: Certified Registered Nurse Anesthetist

## 2014-04-13 ENCOUNTER — Ambulatory Visit (HOSPITAL_COMMUNITY): Payer: Medicare Other

## 2014-04-13 ENCOUNTER — Ambulatory Visit (HOSPITAL_COMMUNITY)
Admission: RE | Admit: 2014-04-13 | Discharge: 2014-04-14 | Disposition: A | Discharge status: Home / Self Care | Payer: Medicare Other | Source: Ambulatory Visit | Attending: Urology | Admitting: Urology

## 2014-04-13 ENCOUNTER — Ambulatory Visit (HOSPITAL_COMMUNITY): Payer: Medicare Other | Admitting: Certified Registered Nurse Anesthetist

## 2014-04-13 ENCOUNTER — Encounter (HOSPITAL_COMMUNITY): Payer: Self-pay | Admitting: Urology

## 2014-04-13 ENCOUNTER — Encounter (HOSPITAL_COMMUNITY)
Admission: RE | Disposition: A | Discharge status: Home / Self Care | Payer: Self-pay | Source: Ambulatory Visit | Attending: Urology

## 2014-04-13 DIAGNOSIS — N200 Calculus of kidney: Secondary | ICD-10-CM | POA: Diagnosis present

## 2014-04-13 DIAGNOSIS — N058 Unspecified nephritic syndrome with other morphologic changes: Secondary | ICD-10-CM | POA: Insufficient documentation

## 2014-04-13 DIAGNOSIS — N201 Calculus of ureter: Secondary | ICD-10-CM | POA: Diagnosis not present

## 2014-04-13 DIAGNOSIS — I10 Essential (primary) hypertension: Secondary | ICD-10-CM | POA: Diagnosis not present

## 2014-04-13 DIAGNOSIS — Z9089 Acquired absence of other organs: Secondary | ICD-10-CM | POA: Insufficient documentation

## 2014-04-13 DIAGNOSIS — E785 Hyperlipidemia, unspecified: Secondary | ICD-10-CM | POA: Diagnosis not present

## 2014-04-13 DIAGNOSIS — K219 Gastro-esophageal reflux disease without esophagitis: Secondary | ICD-10-CM | POA: Diagnosis not present

## 2014-04-13 DIAGNOSIS — E1129 Type 2 diabetes mellitus with other diabetic kidney complication: Secondary | ICD-10-CM | POA: Diagnosis not present

## 2014-04-13 DIAGNOSIS — H409 Unspecified glaucoma: Secondary | ICD-10-CM | POA: Diagnosis not present

## 2014-04-13 DIAGNOSIS — E669 Obesity, unspecified: Secondary | ICD-10-CM | POA: Insufficient documentation

## 2014-04-13 DIAGNOSIS — D649 Anemia, unspecified: Secondary | ICD-10-CM | POA: Insufficient documentation

## 2014-04-13 DIAGNOSIS — Z9010 Acquired absence of unspecified breast and nipple: Secondary | ICD-10-CM | POA: Insufficient documentation

## 2014-04-13 DIAGNOSIS — Z90710 Acquired absence of both cervix and uterus: Secondary | ICD-10-CM | POA: Diagnosis not present

## 2014-04-13 DIAGNOSIS — Z79899 Other long term (current) drug therapy: Secondary | ICD-10-CM | POA: Insufficient documentation

## 2014-04-13 DIAGNOSIS — Z853 Personal history of malignant neoplasm of breast: Secondary | ICD-10-CM | POA: Diagnosis not present

## 2014-04-13 HISTORY — PX: NEPHROLITHOTOMY: SHX5134

## 2014-04-13 HISTORY — PX: CYSTOSCOPY: SHX5120

## 2014-04-13 HISTORY — DX: Calculus of kidney: N20.0

## 2014-04-13 LAB — TYPE AND SCREEN
ABO/RH(D): A POS
Antibody Screen: NEGATIVE

## 2014-04-13 LAB — BASIC METABOLIC PANEL
Anion gap: 12 (ref 5–15)
BUN: 10 mg/dL (ref 6–23)
CALCIUM: 8.6 mg/dL (ref 8.4–10.5)
CHLORIDE: 98 meq/L (ref 96–112)
CO2: 26 meq/L (ref 19–32)
CREATININE: 1.2 mg/dL — AB (ref 0.50–1.10)
GFR calc Af Amer: 53 mL/min — ABNORMAL LOW (ref >90)
GFR calc non Af Amer: 46 mL/min — ABNORMAL LOW (ref >90)
Glucose, Bld: 280 mg/dL — ABNORMAL HIGH (ref 70–99)
Potassium: 4.4 mEq/L (ref 3.7–5.3)
Sodium: 136 mEq/L — ABNORMAL LOW (ref 137–147)

## 2014-04-13 LAB — HEMOGLOBIN AND HEMATOCRIT, BLOOD
HEMATOCRIT: 31.3 % — AB (ref 36.0–46.0)
Hemoglobin: 10.2 g/dL — ABNORMAL LOW (ref 12.0–15.0)

## 2014-04-13 LAB — GLUCOSE, CAPILLARY
GLUCOSE-CAPILLARY: 298 mg/dL — AB (ref 70–99)
Glucose-Capillary: 180 mg/dL — ABNORMAL HIGH (ref 70–99)
Glucose-Capillary: 258 mg/dL — ABNORMAL HIGH (ref 70–99)
Glucose-Capillary: 245 mg/dL — ABNORMAL HIGH (ref 70–99)

## 2014-04-13 LAB — ABO/RH: ABO/RH(D): A POS

## 2014-04-13 SURGERY — NEPHROLITHOTOMY PERCUTANEOUS
Anesthesia: General | Laterality: Left

## 2014-04-13 MED ORDER — PROPOFOL 10 MG/ML IV BOLUS
INTRAVENOUS | Status: AC
  Filled 2014-04-13: qty 20

## 2014-04-13 MED ORDER — NEOSTIGMINE METHYLSULFATE 10 MG/10ML IV SOLN
INTRAVENOUS | Status: DC | PRN
  Administered 2014-04-13: 5 mg via INTRAVENOUS

## 2014-04-13 MED ORDER — FENTANYL CITRATE 0.05 MG/ML IJ SOLN
INTRAMUSCULAR | Status: AC
  Filled 2014-04-13: qty 5

## 2014-04-13 MED ORDER — MORPHINE SULFATE 2 MG/ML IJ SOLN: 2.0000 mg | INTRAMUSCULAR | Status: DC | PRN

## 2014-04-13 MED ORDER — ROCURONIUM BROMIDE 100 MG/10ML IV SOLN
INTRAVENOUS | Status: AC
  Filled 2014-04-13: qty 1

## 2014-04-13 MED ORDER — BUPIVACAINE HCL 0.5 % IJ SOLN
INTRAMUSCULAR | Status: DC | PRN
  Administered 2014-04-13: 10 mL

## 2014-04-13 MED ORDER — FAMOTIDINE 20 MG PO TABS
20.0000 mg | ORAL_TABLET | Freq: Every day | ORAL | Status: DC
  Administered 2014-04-13 – 2014-04-14 (×2): 20 mg via ORAL
  Filled 2014-04-13 (×2): qty 1

## 2014-04-13 MED ORDER — HYDROMORPHONE HCL PF 1 MG/ML IJ SOLN
INTRAMUSCULAR | Status: AC
  Filled 2014-04-13: qty 1

## 2014-04-13 MED ORDER — SODIUM CHLORIDE 0.9 % IJ SOLN
INTRAMUSCULAR | Status: AC
  Filled 2014-04-13: qty 10

## 2014-04-13 MED ORDER — LIDOCAINE HCL (CARDIAC) 20 MG/ML IV SOLN
INTRAVENOUS | Status: DC | PRN
  Administered 2014-04-13: 100 mg via INTRAVENOUS

## 2014-04-13 MED ORDER — CEFAZOLIN SODIUM-DEXTROSE 2-3 GM-% IV SOLR
INTRAVENOUS | Status: AC
Start: 2014-04-13 — End: 2014-04-13
  Filled 2014-04-13: qty 50

## 2014-04-13 MED ORDER — CIPROFLOXACIN IN D5W 400 MG/200ML IV SOLN
400.0000 mg | INTRAVENOUS | Status: AC
  Administered 2014-04-13: 400 mg via INTRAVENOUS

## 2014-04-13 MED ORDER — CEFAZOLIN SODIUM-DEXTROSE 2-3 GM-% IV SOLR
2.0000 g | INTRAVENOUS | Status: AC
  Administered 2014-04-13: 2 g via INTRAVENOUS

## 2014-04-13 MED ORDER — GLYCOPYRROLATE 0.2 MG/ML IJ SOLN
INTRAMUSCULAR | Status: AC
  Filled 2014-04-13: qty 3

## 2014-04-13 MED ORDER — ONDANSETRON HCL 4 MG/2ML IJ SOLN
INTRAMUSCULAR | Status: DC | PRN
Start: 2014-04-13 — End: 2014-04-13
  Administered 2014-04-13: 4 mg via INTRAVENOUS

## 2014-04-13 MED ORDER — LACTATED RINGERS IV SOLN
INTRAVENOUS | Status: DC | PRN
  Administered 2014-04-13 (×3): via INTRAVENOUS

## 2014-04-13 MED ORDER — INSULIN ASPART 100 UNIT/ML ~~LOC~~ SOLN
0.0000 [IU] | SUBCUTANEOUS | Status: DC
Start: 2014-04-13 — End: 2014-04-13

## 2014-04-13 MED ORDER — OXYCODONE HCL 5 MG/5ML PO SOLN
5.0000 mg | Freq: Once | ORAL | Status: DC | PRN
  Filled 2014-04-13: qty 5

## 2014-04-13 MED ORDER — HYDROMORPHONE HCL PF 1 MG/ML IJ SOLN
0.2500 mg | INTRAMUSCULAR | Status: DC | PRN
  Administered 2014-04-13: 0.25 mg via INTRAVENOUS

## 2014-04-13 MED ORDER — ALBUTEROL SULFATE HFA 108 (90 BASE) MCG/ACT IN AERS
INHALATION_SPRAY | RESPIRATORY_TRACT | Status: DC | PRN
  Administered 2014-04-13 (×2): 5 via RESPIRATORY_TRACT

## 2014-04-13 MED ORDER — FENTANYL CITRATE 0.05 MG/ML IJ SOLN
INTRAMUSCULAR | Status: DC | PRN
  Administered 2014-04-13 (×2): 25 ug via INTRAVENOUS
  Administered 2014-04-13: 50 ug via INTRAVENOUS

## 2014-04-13 MED ORDER — DEXAMETHASONE SODIUM PHOSPHATE 10 MG/ML IJ SOLN
INTRAMUSCULAR | Status: AC
  Filled 2014-04-13: qty 1

## 2014-04-13 MED ORDER — NEBIVOLOL HCL 10 MG PO TABS
20.0000 mg | ORAL_TABLET | Freq: Two times a day (BID) | ORAL | Status: DC
  Administered 2014-04-13 – 2014-04-14 (×2): 20 mg via ORAL
  Filled 2014-04-13 (×3): qty 2

## 2014-04-13 MED ORDER — SUCCINYLCHOLINE CHLORIDE 20 MG/ML IJ SOLN
INTRAMUSCULAR | Status: DC | PRN
  Administered 2014-04-13: 100 mg via INTRAVENOUS

## 2014-04-13 MED ORDER — SODIUM CHLORIDE 0.9 % IR SOLN
Status: DC | PRN
  Administered 2014-04-13: 21000 mL

## 2014-04-13 MED ORDER — LACTATED RINGERS IV SOLN
INTRAVENOUS | Status: DC
  Administered 2014-04-13 (×2): via INTRAVENOUS

## 2014-04-13 MED ORDER — LIDOCAINE-EPINEPHRINE (PF) 1 %-1:200000 IJ SOLN
INTRAMUSCULAR | Status: DC | PRN
  Administered 2014-04-13: 10 mL

## 2014-04-13 MED ORDER — PROMETHAZINE HCL 25 MG/ML IJ SOLN
6.2500 mg | INTRAMUSCULAR | Status: DC | PRN
Start: 2014-04-13 — End: 2014-04-13

## 2014-04-13 MED ORDER — DOCUSATE SODIUM 100 MG PO CAPS
100.0000 mg | ORAL_CAPSULE | Freq: Two times a day (BID) | ORAL | Status: DC
  Administered 2014-04-13 – 2014-04-14 (×2): 100 mg via ORAL
  Filled 2014-04-13 (×3): qty 1

## 2014-04-13 MED ORDER — LIDOCAINE-EPINEPHRINE 1 %-1:100000 IJ SOLN
INTRAMUSCULAR | Status: AC
  Filled 2014-04-13: qty 1

## 2014-04-13 MED ORDER — STERILE WATER FOR IRRIGATION IR SOLN
Status: DC | PRN
  Administered 2014-04-13: 1000 mL

## 2014-04-13 MED ORDER — CIPROFLOXACIN IN D5W 400 MG/200ML IV SOLN
INTRAVENOUS | Status: AC
  Filled 2014-04-13: qty 200

## 2014-04-13 MED ORDER — MIDAZOLAM HCL 5 MG/5ML IJ SOLN
INTRAMUSCULAR | Status: DC | PRN
  Administered 2014-04-13: 1 mg via INTRAVENOUS

## 2014-04-13 MED ORDER — NEOSTIGMINE METHYLSULFATE 10 MG/10ML IV SOLN
INTRAVENOUS | Status: AC
  Filled 2014-04-13: qty 1

## 2014-04-13 MED ORDER — SENNA 8.6 MG PO TABS
1.0000 | ORAL_TABLET | Freq: Two times a day (BID) | ORAL | Status: DC
  Administered 2014-04-13: 8.6 mg via ORAL
  Filled 2014-04-13: qty 1

## 2014-04-13 MED ORDER — EPHEDRINE SULFATE 50 MG/ML IJ SOLN
INTRAMUSCULAR | Status: AC
  Filled 2014-04-13: qty 1

## 2014-04-13 MED ORDER — INSULIN ASPART 100 UNIT/ML ~~LOC~~ SOLN
0.0000 [IU] | Freq: Three times a day (TID) | SUBCUTANEOUS | Status: DC
  Administered 2014-04-13: 5 [IU] via SUBCUTANEOUS
  Administered 2014-04-14: 8 [IU] via SUBCUTANEOUS
  Administered 2014-04-14: 5 [IU] via SUBCUTANEOUS

## 2014-04-13 MED ORDER — MENTHOL 3 MG MT LOZG
1.0000 | LOZENGE | OROMUCOSAL | Status: DC | PRN
  Administered 2014-04-13: 3 mg via ORAL
  Filled 2014-04-13: qty 9

## 2014-04-13 MED ORDER — LOSARTAN POTASSIUM 25 MG PO TABS
25.0000 mg | ORAL_TABLET | Freq: Every morning | ORAL | Status: DC
  Administered 2014-04-13 – 2014-04-14 (×2): 25 mg via ORAL
  Filled 2014-04-13 (×2): qty 1

## 2014-04-13 MED ORDER — PHENYLEPHRINE HCL 10 MG/ML IJ SOLN
10.0000 mg | INTRAVENOUS | Status: DC | PRN
  Administered 2014-04-13: 25 ug/min via INTRAVENOUS

## 2014-04-13 MED ORDER — IOHEXOL 300 MG/ML  SOLN
INTRAMUSCULAR | Status: DC | PRN
  Administered 2014-04-13: 50 mL

## 2014-04-13 MED ORDER — LIDOCAINE HCL (CARDIAC) 20 MG/ML IV SOLN
INTRAVENOUS | Status: AC
  Filled 2014-04-13: qty 5

## 2014-04-13 MED ORDER — GLYCOPYRROLATE 0.2 MG/ML IJ SOLN
INTRAMUSCULAR | Status: DC | PRN
  Administered 2014-04-13: 0.6 mg via INTRAVENOUS

## 2014-04-13 MED ORDER — ALBUTEROL SULFATE HFA 108 (90 BASE) MCG/ACT IN AERS
INHALATION_SPRAY | RESPIRATORY_TRACT | Status: AC
  Filled 2014-04-13: qty 6.7

## 2014-04-13 MED ORDER — ONDANSETRON HCL 4 MG/2ML IJ SOLN
INTRAMUSCULAR | Status: AC
  Filled 2014-04-13: qty 2

## 2014-04-13 MED ORDER — OXYCODONE-ACETAMINOPHEN 5-325 MG PO TABS
1.0000 | ORAL_TABLET | ORAL | Status: DC | PRN
Start: 2014-04-13 — End: 2014-04-14
  Administered 2014-04-13: 1 via ORAL
  Administered 2014-04-13 – 2014-04-14 (×2): 2 via ORAL
  Filled 2014-04-13: qty 1
  Filled 2014-04-13 (×2): qty 2

## 2014-04-13 MED ORDER — ROCURONIUM BROMIDE 100 MG/10ML IV SOLN
INTRAVENOUS | Status: DC | PRN
  Administered 2014-04-13: 40 mg via INTRAVENOUS
  Administered 2014-04-13: 5 mg via INTRAVENOUS
  Administered 2014-04-13: 10 mg via INTRAVENOUS
  Administered 2014-04-13: 5 mg via INTRAVENOUS

## 2014-04-13 MED ORDER — PROPOFOL 10 MG/ML IV BOLUS
INTRAVENOUS | Status: DC | PRN
  Administered 2014-04-13: 130 mg via INTRAVENOUS

## 2014-04-13 MED ORDER — BUPIVACAINE HCL (PF) 0.5 % IJ SOLN
INTRAMUSCULAR | Status: AC
  Filled 2014-04-13: qty 30

## 2014-04-13 MED ORDER — PHENYLEPHRINE HCL 10 MG/ML IJ SOLN
INTRAMUSCULAR | Status: AC
  Filled 2014-04-13: qty 1

## 2014-04-13 MED ORDER — EPHEDRINE SULFATE 50 MG/ML IJ SOLN
INTRAMUSCULAR | Status: DC | PRN
  Administered 2014-04-13 (×2): 10 mg via INTRAVENOUS

## 2014-04-13 MED ORDER — OXYCODONE HCL 5 MG PO TABS: 5.0000 mg | ORAL_TABLET | Freq: Once | ORAL | Status: DC | PRN

## 2014-04-13 MED ORDER — ONDANSETRON HCL 4 MG/2ML IJ SOLN
4.0000 mg | INTRAMUSCULAR | Status: DC | PRN
  Administered 2014-04-14 (×2): 4 mg via INTRAVENOUS
  Filled 2014-04-13 (×2): qty 2

## 2014-04-13 MED ORDER — CIPROFLOXACIN IN D5W 400 MG/200ML IV SOLN
400.0000 mg | Freq: Two times a day (BID) | INTRAVENOUS | Status: DC
  Administered 2014-04-13 – 2014-04-14 (×2): 400 mg via INTRAVENOUS
  Filled 2014-04-13 (×2): qty 200

## 2014-04-13 MED ORDER — DEXAMETHASONE SODIUM PHOSPHATE 10 MG/ML IJ SOLN
INTRAMUSCULAR | Status: DC | PRN
  Administered 2014-04-13: 10 mg via INTRAVENOUS

## 2014-04-13 MED ORDER — POLYVINYL ALCOHOL 1.4 % OP SOLN: 1.0000 [drp] | Freq: Three times a day (TID) | OPHTHALMIC | Status: DC | PRN

## 2014-04-13 MED ORDER — MIDAZOLAM HCL 2 MG/2ML IJ SOLN
INTRAMUSCULAR | Status: AC
  Filled 2014-04-13: qty 2

## 2014-04-13 SURGICAL SUPPLY — 66 items
APL SKNCLS STERI-STRIP NONHPOA (GAUZE/BANDAGES/DRESSINGS) ×1
BAG URINE DRAINAGE (UROLOGICAL SUPPLIES) ×4 IMPLANT
BASKET ZERO TIP NITINOL 2.4FR (BASKET) ×1 IMPLANT
BENZOIN TINCTURE PRP APPL 2/3 (GAUZE/BANDAGES/DRESSINGS) ×4 IMPLANT
BLADE SURG 15 STRL LF DISP TIS (BLADE) ×1 IMPLANT
BLADE SURG 15 STRL SS (BLADE) ×3
BSKT STON RTRVL ZERO TP 2.4FR (BASKET)
CATCHER STONE W/TUBE ADAPTER (UROLOGICAL SUPPLIES) ×1 IMPLANT
CATH AINSWORTH 30CC 24FR (CATHETERS) ×3 IMPLANT
CATH BEACON 5.038 65CM KMP-01 (CATHETERS) ×3 IMPLANT
CATH FOLEY 2W COUNCIL 20FR 5CC (CATHETERS) IMPLANT
CATH FOLEY 2WAY SLVR  5CC 16FR (CATHETERS) ×2
CATH FOLEY 2WAY SLVR 5CC 16FR (CATHETERS) ×1 IMPLANT
CATH INTERMIT  6FR 70CM (CATHETERS) IMPLANT
CATH ROBINSON RED A/P 20FR (CATHETERS) IMPLANT
CATH URET 5FR 28IN OPEN ENDED (CATHETERS) ×2 IMPLANT
CATH X-FORCE N30 NEPHROSTOMY (TUBING) ×3 IMPLANT
CHLORAPREP W/TINT 26ML (MISCELLANEOUS) ×6 IMPLANT
COVER SURGICAL LIGHT HANDLE (MISCELLANEOUS) ×3 IMPLANT
DRAPE C-ARM 42X120 X-RAY (DRAPES) ×3 IMPLANT
DRAPE CAMERA CLOSED 9X96 (DRAPES) IMPLANT
DRAPE LG THREE QUARTER DISP (DRAPES) ×3 IMPLANT
DRAPE LINGEMAN PERC (DRAPES) ×3 IMPLANT
DRAPE SURG IRRIG POUCH 19X23 (DRAPES) ×3 IMPLANT
DRSG PAD ABDOMINAL 8X10 ST (GAUZE/BANDAGES/DRESSINGS) ×6 IMPLANT
DRSG TEGADERM 6X8 (GAUZE/BANDAGES/DRESSINGS) ×3 IMPLANT
DRSG TEGADERM 8X12 (GAUZE/BANDAGES/DRESSINGS) ×6 IMPLANT
FIBER LASER FLEXIVA 550 (UROLOGICAL SUPPLIES) IMPLANT
GAUZE SPONGE 4X4 12PLY STRL (GAUZE/BANDAGES/DRESSINGS) ×3 IMPLANT
GLOVE BIO SURGEON STRL SZ 6.5 (GLOVE) ×6 IMPLANT
GLOVE BIO SURGEONS STRL SZ 6.5 (GLOVE) ×3
GLOVE BIOGEL M STRL SZ7.5 (GLOVE) ×6 IMPLANT
GLOVE BIOGEL PI IND STRL 6.5 (GLOVE) ×2 IMPLANT
GLOVE BIOGEL PI INDICATOR 6.5 (GLOVE) ×4
GOWN STRL REUS W/ TWL LRG LVL3 (GOWN DISPOSABLE) ×3 IMPLANT
GOWN STRL REUS W/TWL LRG LVL3 (GOWN DISPOSABLE) ×9
GOWN STRL REUS W/TWL XL LVL3 (GOWN DISPOSABLE) ×9 IMPLANT
GUIDEWIRE AMPLAZ .035X145 (WIRE) ×6 IMPLANT
GUIDEWIRE ANG ZIPWIRE 038X150 (WIRE) ×3 IMPLANT
GUIDEWIRE STR DUAL SENSOR (WIRE) ×4 IMPLANT
KIT BASIN OR (CUSTOM PROCEDURE TRAY) ×3 IMPLANT
MANIFOLD NEPTUNE II (INSTRUMENTS) ×3 IMPLANT
NDL TROCAR 18X15 ECHO (NEEDLE) IMPLANT
NDL TROCAR 18X20 (NEEDLE) IMPLANT
NEEDLE TROCAR 18X15 ECHO (NEEDLE) ×3 IMPLANT
NEEDLE TROCAR 18X20 (NEEDLE) IMPLANT
NS IRRIG 1000ML POUR BTL (IV SOLUTION) ×1 IMPLANT
PACK BASIC VI WITH GOWN DISP (CUSTOM PROCEDURE TRAY) ×3 IMPLANT
PACK CYSTO (CUSTOM PROCEDURE TRAY) ×3 IMPLANT
PROBE LITHOCLAST ULTRA 3.8X403 (UROLOGICAL SUPPLIES) ×3 IMPLANT
PROBE PNEUMATIC 1.0MMX570MM (UROLOGICAL SUPPLIES) ×3 IMPLANT
SET IRRIG Y TYPE TUR BLADDER L (SET/KITS/TRAYS/PACK) ×3 IMPLANT
SET WARMING FLUID IRRIGATION (MISCELLANEOUS) IMPLANT
SPONGE GAUZE 4X4 12PLY (GAUZE/BANDAGES/DRESSINGS) ×2 IMPLANT
SPONGE LAP 4X18 X RAY DECT (DISPOSABLE) ×3 IMPLANT
STENT CONTOUR 7FRX26X.038 (STENTS) ×2 IMPLANT
STONE CATCHER W/TUBE ADAPTER (UROLOGICAL SUPPLIES) ×3 IMPLANT
SUT ETHILON 3 0 PS 1 (SUTURE) ×3 IMPLANT
SUT SILK 0 FSL (SUTURE) ×1 IMPLANT
SYRINGE 12CC LL (MISCELLANEOUS) ×1 IMPLANT
SYRINGE 20CC LL (MISCELLANEOUS) ×6 IMPLANT
SYRINGE IRR TOOMEY STRL 70CC (SYRINGE) ×3 IMPLANT
TOWEL OR 17X26 10 PK STRL BLUE (TOWEL DISPOSABLE) ×3 IMPLANT
TUBING CONNECTING 10 (TUBING) ×4 IMPLANT
TUBING CONNECTING 10' (TUBING) ×1
WATER STERILE IRR 1500ML POUR (IV SOLUTION) IMPLANT

## 2014-04-13 NOTE — H&P (Signed)
Reason For Visit Follow-up regarding large left renal pelvis/UPJ stone burden and consideration of left PCNL   History of Present Illness This 68 year old female patient who has been seen and evaluated by Dr. Lowella Bandy, mD and found to have 2 large stones in the left renal pelvis. She initially presented with intermittent left flank pain. Given the size of the stones, she was referred to me for consideration of PCNL. The patient and her CT scan findings included enlarging right renal cyst, simple in nature, as well as pulmonary nodules. CT chest was obtained showing multiple small nodules with the recommendation of repeat CT chest in 12 months. The patient has a remote past history of left pyelolithotomy.    Past Medical History Problems  1. History of arthritis (V13.4) 2. History of breast cancer (V10.3) 3. History of cardiac murmur (V12.59) 4. History of diabetes mellitus (V12.29) 5. History of glaucoma (V12.49) 6. History of hypercholesterolemia (V12.29) 7. History of hypertension (V12.59)  Surgical History Problems  1. History of Appendectomy 2. History of Breast Surgery Mastectomy 3. History of Cataract Surgery 4. History of Cesarean Section 5. History of Hysterectomy 6. History of Lithotripsy  Current Meds 1. Acetaminophen 325 MG Oral Tablet;  Therapy: (Recorded:07May2015) to Recorded 2. Aleve TABS;  Therapy: (Recorded:23Jun2015) to Recorded 3. Bystolic 10 MG Oral Tablet;  Therapy: (Recorded:07May2015) to Recorded 4. Calcium Carbonate TABS;  Therapy: (Recorded:07May2015) to Recorded 5. Fish Oil CAPS;  Therapy: (Recorded:07May2015) to Recorded 6. Imodium Advanced TABS;  Therapy: (Recorded:07May2015) to Recorded 7. Januvia 100 MG Oral Tablet;  Therapy: (Recorded:07May2015) to Recorded 8. Losartan Potassium 25 MG Oral Tablet;  Therapy: (Recorded:07May2015) to Recorded 9. Magnesium CAPS;  Therapy: (Recorded:07May2015) to Recorded 10. Multi-Vitamin TABS;   Therapy:  (Recorded:07May2015) to Recorded 11. Vitamin B12 TABS;   Therapy: (Recorded:23Jun2015) to Recorded 12. Vitamin D TABS;   Therapy: (Recorded:07May2015) to Recorded  Allergies Medication  1. ACE Inhibitors 2. Demerol TABS 3. Statins  Family History Problems  1. Family history of malignant neoplasm of esophagus (V16.0) : Brother  Social History Problems  1. Denied: History of Alcohol use 2. Caffeine use (V49.89) 3. Father deceased   83 in a shooting 49. Married 5. Mother deceased   64 cardiac arrest 01-09-23. Never a smoker  Vitals Vital Signs [Data Includes: Last 1 Day]  Recorded: 23Jun2015 03:20PM  Blood Pressure: 139 / 73 Temperature: 97.9 F Heart Rate: 87  Physical Exam Constitutional: Well nourished and well developed . No acute distress.  ENT:. The ears and nose are normal in appearance.  Neck: The appearance of the neck is normal and no neck mass is present.  Pulmonary: No respiratory distress, normal respiratory rhythm and effort and clear bilateral breath sounds.  Cardiovascular: Heart rate and rhythm are normal . The arterial pulses are normal. No peripheral edema. No obvious murmurs are appreciated.  Abdomen: The abdomen is soft and nontender. No masses are palpated. No CVA tenderness.  Lymphatics: The posterior cervical and anterior cervical nodes are not enlarged or tender.  Skin: Normal skin turgor, no visible rash and no visible skin lesions.  Neuro/Psych:. Mood and affect are appropriate.    Results/Data Urine [Data Includes: Last 1 Day]   [id number]  COLOR YELLOW   APPEARANCE CLEAR   SPECIFIC GRAVITY 1.025   pH 5.5   GLUCOSE NEG mg/dL  BILIRUBIN SMALL   KETONE NEG mg/dL  BLOOD LARGE   PROTEIN 100 mg/dL  UROBILINOGEN 0.2 mg/dL  NITRITE NEG   LEUKOCYTE ESTERASE SMALL   SQUAMOUS  EPITHELIAL/HPF MODERATE   WBC 7-10 WBC/hpf  RBC TNTC RBC/hpf  BACTERIA MANY   CRYSTALS NONE SEEN   CASTS NONE SEEN    Old records or history reviewed:Marland Kitchen  The following  images/tracing/specimen were independently visualized: .   CT abdomen/pelvis:  2.3 cm left renal pelvis/proximal ureter calcification with adjacent  surrounding renal/ureteral stranding and mild left hydronephrosis.  This could indicate partly obstructing calculus and may explain the  history of hematuria and left flank pain.      Assessment Patient with a large stone burden in the left renal pelvis with intermittent ball valve action being due to intermittent renal colic.   Plan  Calculus of left kidney  1. Follow-up Schedule Surgery Office  Follow-up  Status: Hold For - Appointment   Requested for: [id number] 2. URINE CULTURE; Status:In Progress - Specimen/Data Collected;   Done: [id number] Health Maintenance  3. UA With REFLEX; [Do Not Release]; Status:Complete;   Done: [id number] 03:08PM  URINE CULTURE; Status:In Progress - Specimen/Data Collected;  Done: [phone] Perform:[facility]; Due:25Jun2015; Marked Important; Last Updated TV:NRWCHJS, Kirtland Bouchard; 03/15/2014 4:37:45 PM;Ordered; Today;  CBI:PJRPZPSU of left kidney; Ordered GA:YGEFUWT, Marland Kitchen;   Discussion/Summary I went over the different treatment options for the patient including staged ureteroscopy and multiple shockwave lithotripsies. Ultimately, however, I recommended that the patient undergo a left PCNL. I went over the rationale for this procedure and explained the risk and benefits of the operation. I explained to the patient that she would likely be in the hospital for at least 1 night and that she should expect about a 4 week recovery. Further, I explained to the patient that after the operation she have a stent in her ureter which would need to be removed in the clinic about 2 weeks after the operation. I would like to get the patient seen by her primary care doctor and surgical clearance obtained. Once we had surgical clearance we will schedule her for this procedure.

## 2014-04-13 NOTE — Op Note (Signed)
Pre-operative diagnosis: left renal pelvis stones >2.0  Post-operative diagnosis: as above   Procedure performed: cystoscopy, left retrograde pyelogram with interpretation, left percutaneous renal access, left nephrolithotomy, left nephrostogram, left ureteral stent, left nephroureteral tube placement.   Surgeon: Dr. Ardis Hughs  Assistant: Dr. Park Liter, MD  Anesthesia: General  Complications: None  Specimens: The majority of the stones were removed and will be sent to the [facility] for further analysis.  Findings: 1. Lower pole access gained and lower pole stones were grasped and removed.  UPJ stone was fragmented with pneumatic part of the lithoclast and then fragments were removed.  There was some difficulty gaining access into the renal pelvis because a flap/false passage was created likely from the sheath - separate wire was placed in retrograde and stent placed in retrograde fashion.  A 1F open ended ureteral catheter was left capped in the perc tract partially down the ureter.  EBL: Approximately 500 cc  Specimens: stone from collecting system - taken to [facility]  Indication: [name] is a 68 y.o. patient with large left stone burden. After reviewing the management options for treatment, he elected to proceed with the above surgical procedure(s). We have discussed the potential benefits and risks of the procedure, side effects of the proposed treatment, the likelihood of the patient achieving the goals of the procedure, and any potential problems that might occur during the procedure or recuperation. Informed consent has been obtained.   Description:  Consent was obtained in the preoperative holding area. The patient was marked appropriately and then taken back to the operating room where she was intubated on the gurney. The patient was flipped prone onto the split leg OR table. Large jelly rolls were placed in the anterior axillary  line on both sides allowing the patient's chest and abdomen to fall inbetween. The patient was then prepped and draped in the routine sterile fashion in the left flank and perineal/vaginal area. A timeout was then held confirming the proper side and procedure as well as antibiotics were administered.  I then used the flexible cystoscope and passed gently into the patient's urethra under visual guidance. Once into the bladder I grasped the stent emanating from the patient's left ureteral orifice and pull it to the urethral meatus. I then passed a wire through the stent and up into the left collecting system. I then removed the stent over the wire and exchanged for a 5 [place] open-ended ureteral Pollock catheter. The Pollack catheter was then advanced up to the UPJ. The wire was then removed and a retrograde pyelogram was performed with the above findings. I then turned my attention to the patient's left flank and obtaining percutaneous renal access.  Using the C-arm rotated at 25 and the bulls-eye technique with an 18-gauge coaxial needle the lower pole posterior lateral calyx was targeted. Then rotating the C-arm AP depth of our needle was noted to be within the calyx and the inner part of the coaxial needle was removed. Urine was noted to return. A 0.38 sensor wire was then passed through the sheath of the coaxial needle and into the left renal collecting system. The wire was then passed down the ureter and into the bladder using fluoroscopic guide and the sheath of the needle was removed. An angiographic catheter was then advanced into the bladder and the wire removed. A Super Stiff wire was then passed into the angiographic catheter and the angiographic catheter removed. A 9 [place] peel-away  dilator was then advanced over the Super Stiff wire and passed into the renal pelvis and across the UPJ under fluoroscopic guidance. The inner part of this dilator was removed and the 0.38 sensor wire was passed  alongside the Super Stiff wire through the dilator and into the left ureter and down into the bladder. The angiographic catheter again was passed over the guidewire and advanced into the bladder, the wire was then removed. A Super Stiff wire was then passed through angiographic catheter and angiographic catheter removed. The outer part of the sheath was then removed, establishing 2 superstiff wires through the lower pole calyx and into the bladder.   The 59 French NephroMax balloon was then passed over one of the Super Stiff wires and the tip guided down into the left lower pole calyx. The balloon was then inflated to approximately 12 atm, and once there was no waist noted under fluoroscopy the access sheath was advanced over the balloon. The balloon was then removed. The wires were then placed back into the sheaths and snapped to the drape.   Using the rigid nephroscope to explore the renal pelvis I encountered several large calculi within the lower pole which I was able to remove without fragmentation using the 2 prong grasper. I then easily able to identify the UPJ stone which was fragmented using the lithoclast and the framentes removed.  Then using a flexible cystoscope I was initially unable to navigate the remaining calyces of the kidney because of the angle into the renal pelvis and it appeared that a tissue flap had been raised preventing my from see the rest of the kidney.  I then exchanged a wire for the open ended ureteral catheter and passed a flexible ureteral scope over the wire.  Once in the kidney pyeloscopy was performed confirming no additional stone fragments.  I then used the cystoscope from above to find the ureteral scope and was able to finally navigate into the renal pelvis.  I then passed a 0.38 sensor wire into the bladder antegrade through cystoscope to ensure that the wire was in the proper position.  I was concerned that the initial wires placed during the access may have been  placed submucosally.  The super stiff working wire was then removed.  Using similar technique as previously described a second Super Stiff wire was passed into the patient's bladder over the repositioned wire which then we used as a working wire. A second look within the renal pelvis demonstrated no additonal stone fragments. Contrast was injected through the cystoscope into the renal pelvis but there was extravasation of contrast and so I was unable to systematically explore each individual calyix    At this point a 26 cm 7 French double-J ureteral stent was placed in a retrograde fashion by advancing the stent over the sensor wire which had been exchanged with the 5 [place] open-ended ureteral Pollock catheter. The stent was advance into the renal pelvis under fluoroscopic guidance. Once the stent was noted to be in the renal pelvis the stent was advance to the urethral meatus. With gentle pressure on the stent the wire was removed and the stent popped into the bladder. This was confirmed under fluoroscopic guidance.  A 24 [place] Ainsworth catheter was then passed over one of the Super Stiff wires through the sheath and into the renal pelvis. The sheath was then backed out of the kidney and cut off the red rubber catheter. The 5 [place] open-ended ureteral Pollock catheter was then  passed through the anus with catheter over the Super Stiff wire and into the proximal ureter. The Ainsworth catheter did not advance into the renal pelvis to my satisfaction, and a nephrostogram was then performed confirming the position of our nephrostomy tube was indeed not well-positioned. This point there was very little bleeding, and a decision was made not to leave a nephrostomy tube. The Ainsworth catheter was removed over the 5 [place] open-ended ureteral catheter which was left behind. After several minutes of direct pressure and observation was noted that there was no significant bleeding from the perc tract.  25 cc of local  anesthesia was then injected into the patient's wound, and the wound was closed with 3-0 nylon in 2 vertical mattress sutures. The open-ended ureteral Sallyanne Havers was also secured to the skin and capped. The incision was then padded using a bundle of 4 x 4's and Hypafix tape. Patient was subsequently rolled over to the supine position and extubated. She was returned to the PACU in excellent condition. At the end of the case all lap and needle and sponges were accounted for. There are no perioperative complications.

## 2014-04-13 NOTE — Transfer of Care (Signed)
Immediate Anesthesia Transfer of Care Note  Patient: JALISE ZAWISTOWSKI  Procedure(s) Performed: Procedure(s) (LRB): LEFT PERCUTANEOUS NEPHROLITHOTOMY WITH SURGEON ACCESS (Left) CYSTOSCOPY FLEXIBLE (Left)  Patient Location: PACU  Anesthesia Type: General  Level of Consciousness: sedated, patient cooperative and responds to stimulation  Airway & Oxygen Therapy: Patient Spontanous Breathing and Patient connected to face mask oxgen  Post-op Assessment: Report given to PACU RN and Post -op Vital signs reviewed and stable  Post vital signs: Reviewed and stable  Complications: No apparent anesthesia complications

## 2014-04-13 NOTE — Progress Notes (Signed)
Assumed care of this patient at 3:30 pm, patient resting in bed. Lethargic, but arousable. Agree with previous RN's assessment. Will continue to monitor patient.

## 2014-04-13 NOTE — Anesthesia Postprocedure Evaluation (Signed)
Anesthesia Post Note  Patient: [name]  Procedure(s) Performed: Procedure(s) (LRB): LEFT PERCUTANEOUS NEPHROLITHOTOMY WITH SURGEON ACCESS (Left) CYSTOSCOPY FLEXIBLE (Left)  Anesthesia type: General  Patient location: PACU  Post pain: Pain level controlled  Post assessment: Post-op Vital signs reviewed  Last Vitals: BP 163/78  Pulse 82  Temp(Src) 36.7 C (Oral)  Resp 18  Ht 5\' 6"  (1.676 m)  Wt 204 lb 6 oz (92.704 kg)  BMI 33.00 kg/m2  SpO2 97%  Post vital signs: Reviewed  Level of consciousness: sedated  Complications: No apparent anesthesia complications

## 2014-04-14 ENCOUNTER — Encounter (HOSPITAL_COMMUNITY): Payer: Self-pay | Admitting: Urology

## 2014-04-14 DIAGNOSIS — N200 Calculus of kidney: Secondary | ICD-10-CM | POA: Diagnosis not present

## 2014-04-14 LAB — CBC
HCT: 27.3 % — ABNORMAL LOW (ref 36.0–46.0)
Hemoglobin: 9 g/dL — ABNORMAL LOW (ref 12.0–15.0)
MCH: 25.4 pg — ABNORMAL LOW (ref 26.0–34.0)
MCHC: 33 g/dL (ref 30.0–36.0)
MCV: 77.1 fL — ABNORMAL LOW (ref 78.0–100.0)
Platelets: 186 10*3/uL (ref 150–400)
RBC: 3.54 MIL/uL — AB (ref 3.87–5.11)
RDW: 15.1 % (ref 11.5–15.5)
WBC: 18.1 10*3/uL — AB (ref 4.0–10.5)

## 2014-04-14 LAB — BASIC METABOLIC PANEL
ANION GAP: 13 (ref 5–15)
BUN: 14 mg/dL (ref 6–23)
CALCIUM: 8.7 mg/dL (ref 8.4–10.5)
CHLORIDE: 98 meq/L (ref 96–112)
CO2: 25 mEq/L (ref 19–32)
Creatinine, Ser: 1.18 mg/dL — ABNORMAL HIGH (ref 0.50–1.10)
GFR calc Af Amer: 54 mL/min — ABNORMAL LOW (ref >90)
GFR calc non Af Amer: 47 mL/min — ABNORMAL LOW (ref >90)
GLUCOSE: 220 mg/dL — AB (ref 70–99)
Potassium: 4.4 mEq/L (ref 3.7–5.3)
SODIUM: 136 meq/L — AB (ref 137–147)

## 2014-04-14 LAB — GLUCOSE, CAPILLARY
GLUCOSE-CAPILLARY: 217 mg/dL — AB (ref 70–99)
Glucose-Capillary: 217 mg/dL — ABNORMAL HIGH (ref 70–99)
Glucose-Capillary: 238 mg/dL — ABNORMAL HIGH (ref 70–99)
Glucose-Capillary: 270 mg/dL — ABNORMAL HIGH (ref 70–99)

## 2014-04-14 MED ORDER — HYDROCODONE-ACETAMINOPHEN 5-325 MG PO TABS: 1.0000 | ORAL_TABLET | ORAL | Status: DC | PRN

## 2014-04-14 MED ORDER — ONDANSETRON HCL 4 MG PO TABS: 4.0000 mg | ORAL_TABLET | Freq: Three times a day (TID) | ORAL | Status: DC | PRN

## 2014-04-14 MED ORDER — ONDANSETRON HCL 4 MG/2ML IJ SOLN: 4.0000 mg | INTRAMUSCULAR | Status: DC | PRN

## 2014-04-14 MED ORDER — DSS 100 MG PO CAPS: 100.0000 mg | ORAL_CAPSULE | Freq: Two times a day (BID) | ORAL | Status: DC

## 2014-04-14 MED ORDER — HYDROCODONE-ACETAMINOPHEN 5-325 MG PO TABS: 1.0000 | ORAL_TABLET | Freq: Four times a day (QID) | ORAL | Status: DC | PRN

## 2014-04-14 NOTE — Progress Notes (Signed)
Urology Inpatient Progress Report S/p left PCNL  Intv/Subj: Tolerated procedure well. Slow to wake up CT scan post-op shows no residual stone fragments. No acute events overnight. Patient is c/o soreness, nausea and lightheadedness.  Past Medical History  Diagnosis Date  . GERD (gastroesophageal reflux disease)     mann  . Insomnia   . Hyperlipidemia   . Hypertension   . Obesity   . DJD (degenerative joint disease) of lumbar spine   . Degenerative disc disease, lumbar     pressing on L3 and L4  . IBS (irritable bowel syndrome)   . Hypersensitivity     in tongue  . Blood transfusion 1980's  . Hx: UTI (urinary tract infection)   . Complication of anesthesia     has a hard time waking up; can't lay flat  . PONV (postoperative nausea and vomiting)   . Dysrhythmia     "irregular"  . Heart murmur     "think I outgrew it"  . Shortness of breath     unable to lie flat  . Exertional dyspnea   . 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 Anemia   . Kidney stones 2009    s/p lithotripsy  . Abdominal hernia 02/26/12    unrepaired  . History of stomach ulcers   . 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 Kidney cysts     RT KIDNEY   Current Facility-Administered Medications  Medication Dose Route Frequency Provider Last Rate Last Dose  . ciprofloxacin (CIPRO) IVPB 400 mg  400 mg Intravenous Q12H Margo Aye, MD   400 mg at 04/13/14 1957  . docusate sodium (COLACE) capsule 100 mg  100 mg Oral BID Margo Aye, MD   100 mg at 04/13/14 2154  . famotidine (PEPCID) tablet 20 mg  20 mg Oral Daily Margo Aye, MD   20 mg at 04/13/14 1649  . insulin aspart (novoLOG) injection 0-15 Units  0-15 Units Subcutaneous TID WC Margo Aye, MD   5 Units at 04/13/14 1805  . lactated ringers infusion   Intravenous Continuous Margo Aye, MD 125 mL/hr at 04/13/14 2359    . losartan (COZAAR) tablet 25 mg  25  mg Oral q morning - 10a Margo Aye, MD   25 mg at 04/13/14 1649  . menthol-cetylpyridinium (CEPACOL) lozenge 3 mg  1 lozenge Oral PRN Ardis Hughs, MD   3 mg at 04/13/14 2153  . morphine 2 MG/ML injection 2-4 mg  2-4 mg Intravenous Q2H PRN Margo Aye, MD      . nebivolol (BYSTOLIC) tablet 20 mg  20 mg Oral BID Margo Aye, MD   20 mg at 04/13/14 2154  . ondansetron (ZOFRAN) injection 4 mg  4 mg Intravenous Q4H PRN Margo Aye, MD   4 mg at 04/14/14 0009  . oxyCODONE-acetaminophen (PERCOCET/ROXICET) 5-325 MG per tablet 1-2 tablet  1-2 tablet Oral Q4H PRN Margo Aye, MD   2 tablet at 04/14/14 0315  . polyvinyl alcohol (LIQUIFILM TEARS) 1.4 % ophthalmic solution 1 drop  1 drop Both Eyes TID PRN Margo Aye, MD      . senna [facility]) tablet 8.6 mg  1 tablet Oral BID Margo Aye, MD   8.6 mg at 04/13/14 2154     Objective: Vital: Filed Vitals:   04/13/14 1418 04/13/14  1714 04/13/14 2105 04/14/14 0529  BP: 163/78  125/74 110/56  Pulse: 82  105 72  Temp: 98 F (36.7 C)  98.2 F (36.8 C) 97.6 F (36.4 C)  TempSrc: Oral  Oral Oral  Resp: 18  18 18   Height: 5\' 6"  (1.676 m)     Weight: 92.704 kg (204 lb 6 oz)     SpO2: 97% 95% 97% 98%   I/Os: I/O last 3 completed shifts: In: 3402.1 [I.V.:3402.1] Out: 1100 [Urine:600; Blood:500]  Physical Exam:  General: Patient is in no apparent distress Lungs: Normal respiratory effort, chest expands symmetrically. GI: The abdomen is soft and nontender without mass. Incision is dressed and saturated. Urine is pink tinged Ext: lower extremities symmetric  Lab Results:  Recent Labs  04/13/14 1334 04/14/14 0415  WBC  --  18.1*  HGB 10.2* 9.0*  HCT 31.3* 27.3*    Recent Labs  04/13/14 1334 04/14/14 0415  NA 136* 136*  K 4.4 4.4  CL 98 98  CO2 26 25  GLUCOSE 280* 220*  BUN 10 14  CREATININE 1.20* 1.18*  CALCIUM 8.6 8.7   No results found for this basename: LABPT, INR,  in the last 72  hours No results found for this basename: LABURIN,  in the last 72 hours Results for orders placed during the hospital encounter of 04/06/14  URINE CULTURE     Status: None   Collection Time    04/06/14  2:09 PM      Result Value Ref Range Status   Specimen Description URINE, RANDOM   Final   Special Requests NONE   Final   Culture  Setup Time     Final   Value: 04/06/2014 19:44     Performed at [facility]     Final   Value: 50,000 COLONIES/ML     Performed at [facility]   Culture     Final   Value: Multiple bacterial morphotypes present, none predominant. Suggest appropriate recollection if clinically indicated.     Performed at [facility]   Report Status 04/07/2014 FINAL   Final    Studies/Results: CT - no residual stones in left kidney, no peri-nephric hematoma  Assessment: 1 Day Post-Op s/p left PCNL, doing well  Plan: Remove nephroureteral catheter this AM D/c foley several hours after Neph-u tube Up and ambulating ADAT Change pain meds to vicodin in the event this is better tolerated. Expect patient to be d/c'd this PM  Louis Meckel W 04/14/2014, 8:00 AM

## 2014-04-14 NOTE — Discharge Summary (Signed)
Date of admission: 04/13/2014  Date of discharge: 04/14/2014  Admission diagnosis: left renal stones  Discharge diagnosis: same  Secondary diagnoses:  Patient Active Problem List   Diagnosis Date Noted  . Nephrolithiasis 04/13/2014  . Kidney stone 03/18/2014  . DDD (degenerative disc disease), lumbar 01/26/2014  . 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 Renal cyst 01/26/2014  . Back pain with left-sided sciatica 12/28/2013  . Type II or unspecified type diabetes mellitus with renal manifestations, uncontrolled(250.42) 08/01/2013  . FATIGUE 12/26/2009  . GASTROPARESIS 12/15/2008  . Esophageal reflux 06/21/2008  . IBS 06/21/2008  . ADENOCARCINOMA, BREAST, RIGHT 02/09/2008  . HYPERLIPIDEMIA 02/09/2008  . OBESITY 02/09/2008  . HYPERTENSION 02/09/2008  . INSOMNIA 02/09/2008    History and Physical: For full details, please see admission history and physical. Briefly, [name] is a 68 y.o. year old patient with large left renal stone burden.   Hospital Course: Patient tolerated the procedure well.  She was then transferred to the floor after an uneventful PACU stay.  Her hospital course was uncomplicated.  On POD#1  she had met discharge criteria: was eating a regular diet, was up and ambulating independently,  pain was well controlled, was voiding without a catheter, and was ready to for discharge.   Laboratory values:   Recent Labs  04/13/14 1334 04/14/14 0415  WBC  --  18.1*  HGB 10.2* 9.0*  HCT 31.3* 27.3*    Recent Labs  04/13/14 1334 04/14/14 0415  NA 136* 136*  K 4.4 4.4  CL 98 98  CO2 26 25  GLUCOSE 280* 220*  BUN 10 14  CREATININE 1.20* 1.18*  CALCIUM 8.6 8.7   No results found for this basename: LABPT, INR,  in the last 72 hours No results found for this basename: LABURIN,  in the last 72 hours Results for orders placed during the hospital encounter of 04/06/14  URINE CULTURE     Status: None   Collection Time    04/06/14  2:09 PM       Result Value Ref Range Status   Specimen Description URINE, RANDOM   Final   Special Requests NONE   Final   Culture  Setup Time     Final   Value: 04/06/2014 19:44     Performed at [facility]     Final   Value: 50,000 COLONIES/ML     Performed at [facility]   Culture     Final   Value: Multiple bacterial morphotypes present, none predominant. Suggest appropriate recollection if clinically indicated.     Performed at [facility]   Report Status 04/07/2014 FINAL   Final    Disposition: Home  Discharge instruction: The patient was instructed to be ambulatory but told to refrain from heavy lifting, strenuous activity, or driving.   Discharge medications:    Medication List         acetaminophen 325 MG tablet  Commonly known as:  TYLENOL  Take 650 mg by mouth every 6 (six) hours as needed for mild pain.     BIOTENE DRY MOUTH MT  Use as directed 1 application in the mouth or throat 2 (two) times daily. USE 2 times DAILY FOR DRY MOUTH     CALCIUM & MAGNESIUM CARBONATES PO  Take 1 tablet by mouth daily.     CINNAMON PO  Take 1 capsule by mouth daily.     cyanocobalamin 2000 MCG tablet  Take 2,000 mcg by mouth daily.     DSS 100 MG Caps  Take 100 mg by mouth 2 (two) times daily.     HYDROcodone-acetaminophen 5-325 MG per tablet  Commonly known as:  NORCO/VICODIN  Take 1-2 tablets by mouth every 6 (six) hours as needed.     IMODIUM PO  Take 1 tablet by mouth every 8 (eight) hours as needed (diarrhea).     LACTAID PO  Take 1 tablet by mouth daily as needed (for lactose intolerance).     losartan 25 MG tablet  Commonly known as:  COZAAR  Take 25 mg by mouth every morning.     multivitamin with minerals Tabs tablet  Take 1 tablet by mouth daily.     naproxen sodium 220 MG tablet  Commonly known as:  ANAPROX  Take 220 mg by mouth 2 (two) times daily with a meal.     nebivolol 10 MG tablet  Commonly known as:   BYSTOLIC  Take 20 mg by mouth 2 (two) times daily.     ondansetron 4 MG tablet  Commonly known as:  ZOFRAN  Take 1 tablet (4 mg total) by mouth every 8 (eight) hours as needed for nausea or vomiting.     psyllium 0.52 G capsule  Commonly known as:  REGULOID  Take 0.52 g by mouth daily.     ranitidine 150 MG tablet  Commonly known as:  ZANTAC  Take 150 mg by mouth daily as needed for heartburn.     sitaGLIPtin 100 MG tablet  Commonly known as:  JANUVIA  Take 100 mg by mouth daily.     SYSTANE OP  Apply 1 drop to eye 3 (three) times daily as needed (dry eyes).     traMADol 50 MG tablet  Commonly known as:  ULTRAM  Take 50 mg by mouth at bedtime as needed for moderate pain.     Vitamin D-3 5000 UNITS Tabs  Take by mouth daily.        Followup:      Follow-up Information   Follow up with Ardis Hughs, MD On 04/26/2014. (10:30)    Specialty:  Urology   Contact information:   [place] [id number] [phone]

## 2014-04-25 ENCOUNTER — Other Ambulatory Visit: Payer: Self-pay | Admitting: Family Medicine

## 2014-04-26 ENCOUNTER — Other Ambulatory Visit: Payer: Self-pay | Admitting: *Deleted

## 2014-04-26 MED ORDER — NEBIVOLOL HCL 10 MG PO TABS: 20.0000 mg | ORAL_TABLET | Freq: Two times a day (BID) | ORAL | Status: DC

## 2014-04-26 NOTE — Telephone Encounter (Signed)
Received fax requesting refill on Bystolic.   Refill appropriate and filled per protocol. 

## 2014-05-17 NOTE — Progress Notes (Signed)
Nicole Blackbird, MD [address] [place] [address]  ADENOCARCINOMA, BREAST, RIGHT - Plan: CBC with Differential, Comprehensive metabolic panel, CBC with Differential, Comprehensive metabolic panel  Anemia, unspecified - Plan: Vitamin B12, Folate, Iron and TIBC, Ferritin, Vitamin B12, Folate, Iron and TIBC, Ferritin  CURRENT THERAPY: Observation  INTERVAL HISTORY: [name] 68 y.o. female returns for  regular  visit for followup of right-sided breast cancer measuring 1.4 x 1.2 cm, grade 2, 8 negative axillary lymph nodes for metastatic disease, ER and PR positive. S/P right mastectomy on 10/17/1995, followed by adjuvant chemotherapy consisting of AC x 4 cycles followed by Tamoxifen for 5 years ending as of 04/09/2001.   She is 18 years out from her Breast cancer diagnosis and was therefore diagnosed at the age of 33. She has never had genetics testing and therefore I will refer to [place] for genetic testing as cancer, she reports, runs in her family. She reports a family history of upper GI cancer, colon concer, prostate cancer, leukemia, etc. She denies any ovarian or endometrial cancers that she is aware of. I have asked her to make a list of family member who have had cancer in preparation for this appointment.   I personally reviewed and went over radiographic studies with the patient.  The results are noted within this dictation.  Screening mammogram in Sept 2014 is BIRADS 1 and she will be due in the next few weeks for her next screening mammogram.  I have reviewed her CHL and paper chart.  She will be due for her next screening mammogram in 2016 per Dr. Oneida Alar' note in the patient's oncology paper chart.  She is S/P cystoscopy, left retrograde pyelogram with interpretation, left percutaneous renal access, left nephrolithotomy, left nephrostogram, left ureteral stent, left nephroureteral tube placement on 04/13/2014 by Dr. Dayna Ramus and is recovering nicely from that  standpoint.  Externally, her surgical sites are completely healed.  I personally reviewed and went over radiographic studies with the patient.  The results are noted within this dictation.  CT of chest report is reviewed and she has benign appearing pulmonary nodules.    I personally reviewed and went over laboratory results with the patient.  The results are noted within this dictation.  Following her aforementioned surgery, she is noted to be anemic, likely from blood loss, and have a leukocytosis, likely reactive.  We will verify that these have resolved today and make sure she is not vitamin deficient.  Oncologically, she denies any complaints and ROS questioning is negative.   Past Medical History  Diagnosis Date  . GERD (gastroesophageal reflux disease)     mann  . Insomnia   . Hyperlipidemia   . Hypertension   . Obesity   . DJD (degenerative joint disease) of lumbar spine   . Degenerative disc disease, lumbar     pressing on L3 and L4  . IBS (irritable bowel syndrome)   . Hypersensitivity     in tongue  . Blood transfusion 1980's  . Hx: UTI (urinary tract infection)   . Complication of anesthesia     has a hard time waking up; can't lay flat  . PONV (postoperative nausea and vomiting)   . Dysrhythmia     "irregular"  . Heart murmur     "think I outgrew it"  . Shortness of breath     unable to lie flat  . Exertional dyspnea   . Adenocarcinoma of breast 1997  right / chemo + tamoxifen x 5 years   . Anemia   . Kidney stones 2009    s/p lithotripsy  . Abdominal hernia 02/26/12    unrepaired  . History of stomach ulcers   . 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 Kidney cysts     RT KIDNEY    has ADENOCARCINOMA, BREAST, RIGHT; HYPERLIPIDEMIA; OBESITY; HYPERTENSION; Esophageal reflux; GASTROPARESIS; IBS; INSOMNIA; FATIGUE; Type II or unspecified type diabetes mellitus with renal manifestations, uncontrolled(250.42); Back pain  with left-sided sciatica; DDD (degenerative disc disease), lumbar; Bulge of lumbar disc without myelopathy; Renal cyst; Kidney stone; and Nephrolithiasis on her problem list.     is allergic to demerol; statins; and ace inhibitors.  Ms. Polka does not currently have medications on file.  Past Surgical History  Procedure Laterality Date  . Cesarean section  1973; 1978; 1981  . Urological surgery for blocked ureter secondary to kidney stone    . Appendectomy    . Implant and screws put in the right lower jaw  11/2008    dental surgery  . Eye surgery    . Ovarian cyst surgery    . Kidney blockage  ?1990's    " major surgery ;put kidney on pump for awhile"  . Mastectomy  1997    right  . Abdominal hysterectomy  2002  . Cataract extraction w/ intraocular lens  implant, bilateral  2009-2010  . Back surgery    . Lumbar fusion  08/2010  . Breast biopsy      right  . Lithotripsy      "several times"  . Parathyroidectomy  02/26/2012    Procedure: PARATHYROIDECTOMY;  Surgeon: Ascencion Dike, MD;  Location: [facility];  Service: ENT;;  . Spine surgery  2011  . Nephrolithotomy Left 04/13/2014    Procedure: LEFT PERCUTANEOUS NEPHROLITHOTOMY WITH SURGEON ACCESS;  Surgeon: Ardis Hughs, MD;  Location: [facility];  Service: Urology;  Laterality: Left;  . Cystoscopy Left 04/13/2014    Procedure: CYSTOSCOPY FLEXIBLE;  Surgeon: Ardis Hughs, MD;  Location: [facility];  Service: Urology;  Laterality: Left;  with STENT    Denies any headaches, dizziness, double vision, fevers, chills, night sweats, nausea, vomiting, diarrhea, constipation, chest pain, heart palpitations, shortness of breath, blood in stool, black tarry stool, urinary pain, urinary burning, urinary frequency, hematuria.   PHYSICAL EXAMINATION  ECOG PERFORMANCE STATUS: 0 - Asymptomatic  Filed Vitals:   05/20/14 1128  BP: 122/70  Pulse: 78  Temp: 98 F (36.7 C)  Resp: 18    GENERAL:alert, no distress, well nourished, well  developed, comfortable, cooperative and smiling SKIN: skin color, texture, turgor are normal, no rashes or significant lesions HEAD: Normocephalic, No masses, lesions, tenderness or abnormalities EYES: normal, PERRLA, EOMI, Conjunctiva are pink and non-injected EARS: External ears normal OROPHARYNX:lips, buccal mucosa, and tongue normal and mucous membranes are moist  NECK: supple, no adenopathy, thyroid normal size, non-tender, without nodularity, no stridor, non-tender, trachea midline LYMPH:  no palpable lymphadenopathy, no hepatosplenomegaly BREAST:left breast normal without mass, skin or nipple changes or axillary nodes, right post-mastectomy site well healed and free of suspicious changes LUNGS: clear to auscultation and percussion HEART: regular rate & rhythm, no murmurs, no gallops, S1 normal and S2 normal ABDOMEN:abdomen soft, non-tender, obese, normal bowel sounds, no masses or organomegaly and large ventral hernia BACK: Back symmetric, no curvature., No CVA tenderness EXTREMITIES:less then 2 second capillary refill, no joint deformities, effusion,  or inflammation, no edema, no skin discoloration, no clubbing, no cyanosis  NEURO: alert & oriented x 3 with fluent speech, no focal motor/sensory deficits, gait normal   LABORATORY DATA: CBC    Component Value Date/Time   WBC 18.1* 04/14/2014 0415   RBC 3.54* 04/14/2014 0415   RBC 4.77 12/01/2012 1214   HGB 9.0* 04/14/2014 0415   HCT 27.3* 04/14/2014 0415   PLT 186 04/14/2014 0415   MCV 77.1* 04/14/2014 0415   MCH 25.4* 04/14/2014 0415   MCHC 33.0 04/14/2014 0415   RDW 15.1 04/14/2014 0415   LYMPHSABS 3.4 05/11/2012 1534   MONOABS 0.8 05/11/2012 1534   EOSABS 0.1 05/11/2012 1534   BASOSABS 0.1 05/11/2012 1534      Chemistry      Component Value Date/Time   NA 136* 04/14/2014 0415   K 4.4 04/14/2014 0415   CL 98 04/14/2014 0415   CO2 25 04/14/2014 0415   BUN 14 04/14/2014 0415   CREATININE 1.18* 04/14/2014 0415   CREATININE 0.97  12/28/2013 1635      Component Value Date/Time   CALCIUM 8.7 04/14/2014 0415   CALCIUM 9.9 02/26/2012 1551   ALKPHOS 54 09/07/2013 1104   AST 25 09/07/2013 1104   ALT 24 09/07/2013 1104   BILITOT 0.6 09/07/2013 1104        RADIOGRAPHIC STUDIES:  05/26/2013  *RADIOLOGY REPORT*  Clinical Data: Screening.  DIGITAL SCREENING UNILATERAL LEFT MAMMOGRAM WITH CAD  Comparison: Previous exam(s).  FINDINGS:  ACR Breast Density Category b: There are scattered areas of  fibroglandular density.  There are no findings suspicious for malignancy.  Images were processed with CAD.  IMPRESSION:  No mammographic evidence of malignancy.  A result letter of this screening mammogram will be mailed directly  to the patient.  RECOMMENDATION:  Screening mammogram in one year. (Code:SM-B-01Y)  BI-RADS CATEGORY 1: Negative.  Original Report Authenticated By: Lillia Mountain, M.D.     ASSESSMENT:  1. Right sided breast cancer, ER/PR positive, S/P mastectomy on 10/17/1995, followed by [facility] x 4, followed by Tamoxifen x 5 years, ending as of 04/09/2001. No evidence of recurrence.  2. Obesity.  3. GERD  4. IBS.  5. Right UE lymphedema. 6. Pulmonary nodules, benign appearing.  7. S/P cystoscopy, left retrograde pyelogram with interpretation, left percutaneous renal access, left nephrolithotomy, left nephrostogram, left ureteral stent, left nephroureteral tube placement on 04/13/2014 by Dr. Louis Meckel   Patient Active Problem List   Diagnosis Date Noted  . Nephrolithiasis 04/13/2014  . Kidney stone 03/18/2014  . DDD (degenerative disc disease), lumbar 01/26/2014  . 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 Renal cyst 01/26/2014  . Back pain with left-sided sciatica 12/28/2013  . Type II or unspecified type diabetes mellitus with renal manifestations, uncontrolled(250.42) 08/01/2013  . FATIGUE 12/26/2009  . GASTROPARESIS 12/15/2008  . Esophageal reflux 06/21/2008  . IBS 06/21/2008  . ADENOCARCINOMA,  BREAST, RIGHT 02/09/2008  . HYPERLIPIDEMIA 02/09/2008  . OBESITY 02/09/2008  . HYPERTENSION 02/09/2008  . INSOMNIA 02/09/2008      PLAN:  1. I personally reviewed and went over laboratory results with the patient.  The results are noted within this dictation. 2. I personally reviewed and went over radiographic studies with the patient.  The results are noted within this dictation.   3. Labs today: CBC diff, CMET, and Anemia panel 4. Next screening mammogram is due this coming month (Sept 2015). 5. Referral to Genetics in [place] for BRCA1/2, PALB2, etc testing due to young  diagnosis of breast cancer and strong family history of malignancies. 6. Next colonoscopy is due in 2016 per Dr. Oneida Alar.  7. Return in 1 year.   THERAPY PLAN:  From an oncology standpoint, her risk of breast cancer recurrence is nearly at the level of anyone who has never had breast cancer. She requests annual breast exams in the clinic. I stressed the importance of annual mammogram.  We will refer her to [facility] in [place] due to young diagnosis of breast cancer with a strong family history of malignancies.  All questions were answered. The patient knows to call the clinic with any problems, questions or concerns. We can certainly see the patient much sooner if necessary.  Patient and plan discussed with Dr. Farrel Gobble and he is in agreement with the aforementioned.   KEFALAS,THOMAS 05/20/2014

## 2014-05-19 ENCOUNTER — Encounter: Payer: Medicare Other | Admitting: Family Medicine

## 2014-05-20 ENCOUNTER — Encounter (HOSPITAL_COMMUNITY): Payer: Medicare Other | Attending: Oncology | Admitting: Oncology

## 2014-05-20 ENCOUNTER — Telehealth: Payer: Self-pay | Admitting: *Deleted

## 2014-05-20 ENCOUNTER — Encounter (HOSPITAL_COMMUNITY): Payer: Self-pay | Admitting: Oncology

## 2014-05-20 ENCOUNTER — Encounter (HOSPITAL_COMMUNITY): Payer: Self-pay | Admitting: Lab

## 2014-05-20 ENCOUNTER — Encounter (HOSPITAL_COMMUNITY): Payer: Medicare Other

## 2014-05-20 VITALS — BP 122/70 | HR 78 | Temp 98.0°F | Resp 18 | Wt 203.8 lb

## 2014-05-20 DIAGNOSIS — D649 Anemia, unspecified: Secondary | ICD-10-CM

## 2014-05-20 DIAGNOSIS — Z87442 Personal history of urinary calculi: Secondary | ICD-10-CM | POA: Insufficient documentation

## 2014-05-20 DIAGNOSIS — C50919 Malignant neoplasm of unspecified site of unspecified female breast: Secondary | ICD-10-CM

## 2014-05-20 DIAGNOSIS — Z806 Family history of leukemia: Secondary | ICD-10-CM | POA: Diagnosis not present

## 2014-05-20 DIAGNOSIS — Z170 Estrogen receptor positive status [ER+]: Secondary | ICD-10-CM | POA: Diagnosis not present

## 2014-05-20 DIAGNOSIS — R0602 Shortness of breath: Secondary | ICD-10-CM | POA: Diagnosis not present

## 2014-05-20 DIAGNOSIS — I890 Lymphedema, not elsewhere classified: Secondary | ICD-10-CM

## 2014-05-20 DIAGNOSIS — M5137 Other intervertebral disc degeneration, lumbosacral region: Secondary | ICD-10-CM | POA: Diagnosis not present

## 2014-05-20 DIAGNOSIS — Z888 Allergy status to other drugs, medicaments and biological substances status: Secondary | ICD-10-CM | POA: Insufficient documentation

## 2014-05-20 DIAGNOSIS — M51379 Other intervertebral disc degeneration, lumbosacral region without mention of lumbar back pain or lower extremity pain: Secondary | ICD-10-CM | POA: Insufficient documentation

## 2014-05-20 DIAGNOSIS — R5381 Other malaise: Secondary | ICD-10-CM | POA: Diagnosis not present

## 2014-05-20 DIAGNOSIS — R0609 Other forms of dyspnea: Secondary | ICD-10-CM | POA: Insufficient documentation

## 2014-05-20 DIAGNOSIS — Z79899 Other long term (current) drug therapy: Secondary | ICD-10-CM | POA: Diagnosis not present

## 2014-05-20 DIAGNOSIS — I499 Cardiac arrhythmia, unspecified: Secondary | ICD-10-CM | POA: Insufficient documentation

## 2014-05-20 DIAGNOSIS — E1129 Type 2 diabetes mellitus with other diabetic kidney complication: Secondary | ICD-10-CM | POA: Diagnosis not present

## 2014-05-20 DIAGNOSIS — Z800 Family history of malignant neoplasm of digestive organs: Secondary | ICD-10-CM | POA: Insufficient documentation

## 2014-05-20 DIAGNOSIS — Z885 Allergy status to narcotic agent status: Secondary | ICD-10-CM | POA: Diagnosis not present

## 2014-05-20 DIAGNOSIS — E785 Hyperlipidemia, unspecified: Secondary | ICD-10-CM | POA: Diagnosis not present

## 2014-05-20 DIAGNOSIS — Z8711 Personal history of peptic ulcer disease: Secondary | ICD-10-CM | POA: Diagnosis not present

## 2014-05-20 DIAGNOSIS — R918 Other nonspecific abnormal finding of lung field: Secondary | ICD-10-CM

## 2014-05-20 DIAGNOSIS — K219 Gastro-esophageal reflux disease without esophagitis: Secondary | ICD-10-CM | POA: Diagnosis not present

## 2014-05-20 DIAGNOSIS — N058 Unspecified nephritic syndrome with other morphologic changes: Secondary | ICD-10-CM | POA: Insufficient documentation

## 2014-05-20 DIAGNOSIS — K3184 Gastroparesis: Secondary | ICD-10-CM | POA: Diagnosis not present

## 2014-05-20 DIAGNOSIS — R5383 Other fatigue: Secondary | ICD-10-CM | POA: Diagnosis not present

## 2014-05-20 DIAGNOSIS — Z9010 Acquired absence of unspecified breast and nipple: Secondary | ICD-10-CM | POA: Insufficient documentation

## 2014-05-20 DIAGNOSIS — Z9889 Other specified postprocedural states: Secondary | ICD-10-CM | POA: Insufficient documentation

## 2014-05-20 DIAGNOSIS — E1165 Type 2 diabetes mellitus with hyperglycemia: Secondary | ICD-10-CM | POA: Insufficient documentation

## 2014-05-20 DIAGNOSIS — K589 Irritable bowel syndrome without diarrhea: Secondary | ICD-10-CM | POA: Diagnosis not present

## 2014-05-20 DIAGNOSIS — I10 Essential (primary) hypertension: Secondary | ICD-10-CM | POA: Insufficient documentation

## 2014-05-20 DIAGNOSIS — E669 Obesity, unspecified: Secondary | ICD-10-CM | POA: Diagnosis not present

## 2014-05-20 DIAGNOSIS — R0989 Other specified symptoms and signs involving the circulatory and respiratory systems: Secondary | ICD-10-CM | POA: Insufficient documentation

## 2014-05-20 DIAGNOSIS — G4700 Insomnia, unspecified: Secondary | ICD-10-CM | POA: Diagnosis not present

## 2014-05-20 DIAGNOSIS — Z853 Personal history of malignant neoplasm of breast: Secondary | ICD-10-CM

## 2014-05-20 LAB — CBC WITH DIFFERENTIAL/PLATELET
Basophils Absolute: 0.1 10*3/uL (ref 0.0–0.1)
Basophils Relative: 1 % (ref 0–1)
Eosinophils Absolute: 0.2 10*3/uL (ref 0.0–0.7)
Eosinophils Relative: 3 % (ref 0–5)
HEMATOCRIT: 32 % — AB (ref 36.0–46.0)
HEMOGLOBIN: 10.4 g/dL — AB (ref 12.0–15.0)
LYMPHS PCT: 33 % (ref 12–46)
Lymphs Abs: 2.6 10*3/uL (ref 0.7–4.0)
MCH: 24.6 pg — ABNORMAL LOW (ref 26.0–34.0)
MCHC: 32.5 g/dL (ref 30.0–36.0)
MCV: 75.8 fL — ABNORMAL LOW (ref 78.0–100.0)
MONOS PCT: 6 % (ref 3–12)
Monocytes Absolute: 0.5 10*3/uL (ref 0.1–1.0)
NEUTROS ABS: 4.6 10*3/uL (ref 1.7–7.7)
Neutrophils Relative %: 57 % (ref 43–77)
Platelets: 211 10*3/uL (ref 150–400)
RBC: 4.22 MIL/uL (ref 3.87–5.11)
RDW: 15 % (ref 11.5–15.5)
WBC: 8 10*3/uL (ref 4.0–10.5)

## 2014-05-20 LAB — IRON AND TIBC
IRON: 27 ug/dL — AB (ref 42–135)
Saturation Ratios: 7 % — ABNORMAL LOW (ref 20–55)
TIBC: 383 ug/dL (ref 250–470)
UIBC: 356 ug/dL (ref 125–400)

## 2014-05-20 LAB — COMPREHENSIVE METABOLIC PANEL
ALT: 18 U/L (ref 0–35)
ANION GAP: 14 (ref 5–15)
AST: 22 U/L (ref 0–37)
Albumin: 3.6 g/dL (ref 3.5–5.2)
Alkaline Phosphatase: 55 U/L (ref 39–117)
BUN: 9 mg/dL (ref 6–23)
CALCIUM: 9 mg/dL (ref 8.4–10.5)
CO2: 26 mEq/L (ref 19–32)
Chloride: 100 mEq/L (ref 96–112)
Creatinine, Ser: 0.9 mg/dL (ref 0.50–1.10)
GFR calc non Af Amer: 65 mL/min — ABNORMAL LOW (ref >90)
GFR, EST AFRICAN AMERICAN: 75 mL/min — AB (ref >90)
Glucose, Bld: 217 mg/dL — ABNORMAL HIGH (ref 70–99)
Potassium: 3.5 mEq/L — ABNORMAL LOW (ref 3.7–5.3)
SODIUM: 140 meq/L (ref 137–147)
Total Bilirubin: 0.3 mg/dL (ref 0.3–1.2)
Total Protein: 7.8 g/dL (ref 6.0–8.3)

## 2014-05-20 LAB — FOLATE: Folate: 15.4 ng/mL

## 2014-05-20 LAB — FERRITIN: Ferritin: 10 ng/mL (ref 10–291)

## 2014-05-20 LAB — VITAMIN B12: VITAMIN B 12: 767 pg/mL (ref 211–911)

## 2014-05-20 NOTE — Patient Instructions (Signed)
[facility] Discharge Instructions  RECOMMENDATIONS MADE BY THE CONSULTANT AND ANY TEST RESULTS WILL BE SENT TO YOUR REFERRING PHYSICIAN.  EXAM FINDINGS BY THE PHYSICIAN TODAY AND SIGNS OR SYMPTOMS TO REPORT TO CLINIC OR PRIMARY PHYSICIAN: Exam and findings as discussed by Robynn Pane, PA-C.  You are doing well.  Will check labs today and if anything is of concern we will contact you. Report any new lumps, bone pain, shortness of breath or other symptoms.    INSTRUCTIONS/FOLLOW-UP: Follow-up in 1 year.  Thank you for choosing [facility] to provide your oncology and hematology care.  To afford each patient quality time with our providers, please arrive at least 15 minutes before your scheduled appointment time.  With your help, our goal is to use those 15 minutes to complete the necessary work-up to ensure our physicians have the information they need to help with your evaluation and healthcare recommendations.    Effective January 1st, 2014, we ask that you re-schedule your appointment with our physicians should you arrive 10 or more minutes late for your appointment.  We strive to give you quality time with our providers, and arriving late affects you and other patients whose appointments are after yours.    Again, thank you for choosing [facility].  Our hope is that these requests will decrease the amount of time that you wait before being seen by our physicians.       _____________________________________________________________  Should you have questions after your visit to [facility], please contact our office at [phone] between the hours of 8:30 a.m. and 4:30 p.m.  Voicemails left after 4:30 p.m. will not be returned until the following business day.  For prescription refill requests, have your pharmacy contact our office with your prescription refill request.     _______________________________________________________________  We hope that we have given you very good care.  You may receive a patient satisfaction survey in the mail, please complete it and return it as soon as possible.  We value your feedback!  _______________________________________________________________  Have you asked about our STAR program?  STAR stands for Survivorship Training and Rehabilitation, and this is a nationally recognized cancer care program that focuses on survivorship and rehabilitation.  Cancer and cancer treatments may cause problems, such as, pain, making you feel tired and keeping you from doing the things that you need or want to do. Cancer rehabilitation can help. Our goal is to reduce these troubling effects and help you have the best quality of life possible.  You may receive a survey from a nurse that asks questions about your current state of health.  Based on the survey results, all eligible patients will be referred to the [facility] program for an evaluation so we can better serve you!  A frequently asked questions sheet is available upon request.

## 2014-05-20 NOTE — Progress Notes (Signed)
Referral sent to [facility].  Faxed on 8/28 and called them to let them know of referral.  I spoke with Lattie Haw on 8/28.  She is to call us with the appointment time and she will contact patient.

## 2014-05-20 NOTE — Telephone Encounter (Signed)
Left message for pt to return my call so I can schedule a genetic appt.  

## 2014-05-23 ENCOUNTER — Telehealth: Payer: Self-pay | Admitting: *Deleted

## 2014-05-23 NOTE — Telephone Encounter (Signed)
Left message for pt to return my call so I can schedule a genetic appt.  

## 2014-05-27 ENCOUNTER — Telehealth: Payer: Self-pay | Admitting: *Deleted

## 2014-05-27 NOTE — Telephone Encounter (Signed)
Left message for the pt to return my call so I can schedule her for a genetic appt.

## 2014-06-01 ENCOUNTER — Telehealth: Payer: Self-pay | Admitting: Internal Medicine

## 2014-06-01 MED ORDER — LOSARTAN POTASSIUM 25 MG PO TABS: 25.0000 mg | ORAL_TABLET | Freq: Every morning | ORAL | Status: DC

## 2014-06-01 NOTE — Telephone Encounter (Signed)
Received fax refill request  Rx # [id number] Medication:  Losartan 25 mg tablet Qty 30 Sig:  Take one tablet by mouth once daily Physician:  Harrington Challenger

## 2014-06-02 ENCOUNTER — Other Ambulatory Visit (HOSPITAL_COMMUNITY): Payer: Self-pay | Admitting: Oncology

## 2014-06-02 DIAGNOSIS — D500 Iron deficiency anemia secondary to blood loss (chronic): Secondary | ICD-10-CM

## 2014-06-03 ENCOUNTER — Telehealth: Payer: Self-pay | Admitting: *Deleted

## 2014-06-03 ENCOUNTER — Other Ambulatory Visit: Payer: Self-pay | Admitting: *Deleted

## 2014-06-03 MED ORDER — SITAGLIPTIN PHOSPHATE 100 MG PO TABS: 100.0000 mg | ORAL_TABLET | Freq: Every day | ORAL | Status: DC

## 2014-06-03 MED ORDER — NEBIVOLOL HCL 10 MG PO TABS: 20.0000 mg | ORAL_TABLET | Freq: Two times a day (BID) | ORAL | Status: DC

## 2014-06-03 NOTE — Telephone Encounter (Signed)
Received fax requesting refill on Januvia and Bystolic.   Requires office visit before any further refills can be given.   Medication filled x1 with no refills.   Letter sent.

## 2014-06-03 NOTE — Telephone Encounter (Signed)
Called and left a message for the pt to return my call so I can schedule a genetic appt.

## 2014-06-09 ENCOUNTER — Telehealth: Payer: Self-pay | Admitting: *Deleted

## 2014-06-09 NOTE — Telephone Encounter (Signed)
Called and confirmed 06/14/14 genetic appt w/ pt.  Called Amy at [facility] to make her aware.

## 2014-06-10 ENCOUNTER — Encounter (HOSPITAL_COMMUNITY): Payer: Medicare Other | Attending: Oncology

## 2014-06-10 VITALS — BP 150/96 | HR 87 | Temp 98.6°F | Resp 18

## 2014-06-10 DIAGNOSIS — R918 Other nonspecific abnormal finding of lung field: Secondary | ICD-10-CM | POA: Insufficient documentation

## 2014-06-10 DIAGNOSIS — M51379 Other intervertebral disc degeneration, lumbosacral region without mention of lumbar back pain or lower extremity pain: Secondary | ICD-10-CM | POA: Insufficient documentation

## 2014-06-10 DIAGNOSIS — Z800 Family history of malignant neoplasm of digestive organs: Secondary | ICD-10-CM | POA: Insufficient documentation

## 2014-06-10 DIAGNOSIS — Z888 Allergy status to other drugs, medicaments and biological substances status: Secondary | ICD-10-CM | POA: Insufficient documentation

## 2014-06-10 DIAGNOSIS — Z87442 Personal history of urinary calculi: Secondary | ICD-10-CM | POA: Insufficient documentation

## 2014-06-10 DIAGNOSIS — R5383 Other fatigue: Secondary | ICD-10-CM

## 2014-06-10 DIAGNOSIS — K219 Gastro-esophageal reflux disease without esophagitis: Secondary | ICD-10-CM | POA: Insufficient documentation

## 2014-06-10 DIAGNOSIS — Z806 Family history of leukemia: Secondary | ICD-10-CM | POA: Insufficient documentation

## 2014-06-10 DIAGNOSIS — E1129 Type 2 diabetes mellitus with other diabetic kidney complication: Secondary | ICD-10-CM | POA: Insufficient documentation

## 2014-06-10 DIAGNOSIS — K3184 Gastroparesis: Secondary | ICD-10-CM | POA: Insufficient documentation

## 2014-06-10 DIAGNOSIS — E1165 Type 2 diabetes mellitus with hyperglycemia: Secondary | ICD-10-CM | POA: Insufficient documentation

## 2014-06-10 DIAGNOSIS — R5381 Other malaise: Secondary | ICD-10-CM | POA: Insufficient documentation

## 2014-06-10 DIAGNOSIS — Z9010 Acquired absence of unspecified breast and nipple: Secondary | ICD-10-CM | POA: Insufficient documentation

## 2014-06-10 DIAGNOSIS — R0602 Shortness of breath: Secondary | ICD-10-CM | POA: Insufficient documentation

## 2014-06-10 DIAGNOSIS — E785 Hyperlipidemia, unspecified: Secondary | ICD-10-CM | POA: Insufficient documentation

## 2014-06-10 DIAGNOSIS — E669 Obesity, unspecified: Secondary | ICD-10-CM | POA: Insufficient documentation

## 2014-06-10 DIAGNOSIS — Z79899 Other long term (current) drug therapy: Secondary | ICD-10-CM | POA: Insufficient documentation

## 2014-06-10 DIAGNOSIS — Z8711 Personal history of peptic ulcer disease: Secondary | ICD-10-CM | POA: Insufficient documentation

## 2014-06-10 DIAGNOSIS — G4700 Insomnia, unspecified: Secondary | ICD-10-CM | POA: Insufficient documentation

## 2014-06-10 DIAGNOSIS — Z170 Estrogen receptor positive status [ER+]: Secondary | ICD-10-CM | POA: Insufficient documentation

## 2014-06-10 DIAGNOSIS — C50919 Malignant neoplasm of unspecified site of unspecified female breast: Secondary | ICD-10-CM | POA: Insufficient documentation

## 2014-06-10 DIAGNOSIS — Z885 Allergy status to narcotic agent status: Secondary | ICD-10-CM | POA: Insufficient documentation

## 2014-06-10 DIAGNOSIS — D649 Anemia, unspecified: Secondary | ICD-10-CM

## 2014-06-10 DIAGNOSIS — Z23 Encounter for immunization: Secondary | ICD-10-CM

## 2014-06-10 DIAGNOSIS — D500 Iron deficiency anemia secondary to blood loss (chronic): Secondary | ICD-10-CM

## 2014-06-10 DIAGNOSIS — R0609 Other forms of dyspnea: Secondary | ICD-10-CM | POA: Insufficient documentation

## 2014-06-10 DIAGNOSIS — I10 Essential (primary) hypertension: Secondary | ICD-10-CM | POA: Insufficient documentation

## 2014-06-10 DIAGNOSIS — M5137 Other intervertebral disc degeneration, lumbosacral region: Secondary | ICD-10-CM | POA: Insufficient documentation

## 2014-06-10 DIAGNOSIS — Z9889 Other specified postprocedural states: Secondary | ICD-10-CM | POA: Insufficient documentation

## 2014-06-10 DIAGNOSIS — I499 Cardiac arrhythmia, unspecified: Secondary | ICD-10-CM | POA: Insufficient documentation

## 2014-06-10 DIAGNOSIS — N058 Unspecified nephritic syndrome with other morphologic changes: Secondary | ICD-10-CM | POA: Insufficient documentation

## 2014-06-10 DIAGNOSIS — K589 Irritable bowel syndrome without diarrhea: Secondary | ICD-10-CM | POA: Insufficient documentation

## 2014-06-10 DIAGNOSIS — R0989 Other specified symptoms and signs involving the circulatory and respiratory systems: Secondary | ICD-10-CM | POA: Insufficient documentation

## 2014-06-10 DIAGNOSIS — I890 Lymphedema, not elsewhere classified: Secondary | ICD-10-CM | POA: Insufficient documentation

## 2014-06-10 MED ORDER — SODIUM CHLORIDE 0.9 % IV SOLN
510.0000 mg | Freq: Once | INTRAVENOUS | Status: AC
  Administered 2014-06-10: 510 mg via INTRAVENOUS
  Filled 2014-06-10: qty 17

## 2014-06-10 MED ORDER — SODIUM CHLORIDE 0.9 % IV SOLN
INTRAVENOUS | Status: DC
  Administered 2014-06-10: 11:00:00 via INTRAVENOUS

## 2014-06-10 MED ORDER — INFLUENZA VAC SPLIT QUAD 0.5 ML IM SUSY
0.5000 mL | PREFILLED_SYRINGE | Freq: Once | INTRAMUSCULAR | Status: AC
  Administered 2014-06-10: 0.5 mL via INTRAMUSCULAR

## 2014-06-10 MED ORDER — INFLUENZA VAC SPLIT QUAD 0.5 ML IM SUSY
PREFILLED_SYRINGE | INTRAMUSCULAR | Status: AC
  Filled 2014-06-10: qty 0.5

## 2014-06-10 MED ORDER — SODIUM CHLORIDE 0.9 % IJ SOLN
10.0000 mL | Freq: Once | INTRAMUSCULAR | Status: AC
  Administered 2014-06-10: 10 mL via INTRAVENOUS

## 2014-06-10 NOTE — Patient Instructions (Signed)
[facility] Discharge Instructions  RECOMMENDATIONS MADE BY THE CONSULTANT AND ANY TEST RESULTS WILL BE SENT TO YOUR REFERRING PHYSICIAN.  MEDICATIONS PRESCRIBED:  Feraheme infusion today. Flu vaccine today.  INSTRUCTIONS/FOLLOW-UP: Return to clinic as scheduled.  Thank you for choosing [facility] to provide your oncology and hematology care.  To afford each patient quality time with our providers, please arrive at least 15 minutes before your scheduled appointment time.  With your help, our goal is to use those 15 minutes to complete the necessary work-up to ensure our physicians have the information they need to help with your evaluation and healthcare recommendations.    Effective January 1st, 2014, we ask that you re-schedule your appointment with our physicians should you arrive 10 or more minutes late for your appointment.  We strive to give you quality time with our providers, and arriving late affects you and other patients whose appointments are after yours.    Again, thank you for choosing [facility].  Our hope is that these requests will decrease the amount of time that you wait before being seen by our physicians.       _____________________________________________________________  Should you have questions after your visit to [facility], please contact our office at [phone] between the hours of 8:30 a.m. and 4:30 p.m.  Voicemails left after 4:30 p.m. will not be returned until the following business day.  For prescription refill requests, have your pharmacy contact our office with your prescription refill request.    _______________________________________________________________  We hope that we have given you very good care.  You may receive a patient satisfaction survey in the mail, please complete it and return it as soon as possible.  We value your  feedback!  _______________________________________________________________  Have you asked about our STAR program?  STAR stands for Survivorship Training and Rehabilitation, and this is a nationally recognized cancer care program that focuses on survivorship and rehabilitation.  Cancer and cancer treatments may cause problems, such as, pain, making you feel tired and keeping you from doing the things that you need or want to do. Cancer rehabilitation can help. Our goal is to reduce these troubling effects and help you have the best quality of life possible.  You may receive a survey from a nurse that asks questions about your current state of health.  Based on the survey results, all eligible patients will be referred to the [facility] program for an evaluation so we can better serve you!  A frequently asked questions sheet is available upon request.

## 2014-06-10 NOTE — Progress Notes (Signed)
Tolerated well

## 2014-06-14 ENCOUNTER — Encounter: Payer: Self-pay | Admitting: Genetic Counselor

## 2014-06-14 ENCOUNTER — Other Ambulatory Visit: Payer: Medicare Other

## 2014-06-14 ENCOUNTER — Ambulatory Visit (HOSPITAL_BASED_OUTPATIENT_CLINIC_OR_DEPARTMENT_OTHER): Payer: Medicare Other | Admitting: Genetic Counselor

## 2014-06-14 DIAGNOSIS — C50919 Malignant neoplasm of unspecified site of unspecified female breast: Secondary | ICD-10-CM

## 2014-06-14 NOTE — Progress Notes (Signed)
Patient Name: [name] Patient Age: 68 y.o. Encounter Date: 06/14/2014  Referring Physician: Randalyn Rhea, PA Farrel Gobble, MD  Primary Care Provider: [name], MD   [name], a 68 y.o. female, is being seen at the [facility] due to a personal and family history of cancer.  She presents to clinic today to discuss the possibility of a hereditary predisposition to cancer and discuss whether genetic testing is warranted.  HISTORY OF PRESENT ILLNESS: [name] was diagnosed with right breast cancer at the age of 6. She is s/p right mastectomy, chemotherapy and Tamoxifen.  The breast tumor was ER positive and PR positive.  [name] reported that she had a TAH/BSO in 2002 due to fibroids. She reports having a yearly mammogram and clinical breast exam. She stated her last colonoscopy was in 2006 and was negative for polyps.  Past Medical History  Diagnosis Date  . GERD (gastroesophageal reflux disease)     mann  . Insomnia   . Hyperlipidemia   . Hypertension   . Obesity   . DJD (degenerative joint disease) of lumbar spine   . Degenerative disc disease, lumbar     pressing on L3 and L4  . IBS (irritable bowel syndrome)   . Hypersensitivity     in tongue  . Blood transfusion 1980's  . Hx: UTI (urinary tract infection)   . Complication of anesthesia     has a hard time waking up; can't lay flat  . PONV (postoperative nausea and vomiting)   . Dysrhythmia     "irregular"  . Heart murmur     "think I outgrew it"  . Shortness of breath     unable to lie flat  . Exertional dyspnea   . 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 Anemia   . Kidney stones 2009    s/p lithotripsy  . Abdominal hernia 02/26/12    unrepaired  . History of stomach ulcers   . 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 Kidney cysts     RT KIDNEY    Past Surgical History  Procedure  Laterality Date  . Cesarean section  1973; 1978; 1981  . Urological surgery for blocked ureter secondary to kidney stone    . Appendectomy    . Implant and screws put in the right lower jaw  11/2008    dental surgery  . Eye surgery    . Ovarian cyst surgery    . Kidney blockage  ?1990's    " major surgery ;put kidney on pump for awhile"  . Mastectomy  1997    right  . Abdominal hysterectomy  2002  . Cataract extraction w/ intraocular lens  implant, bilateral  2009-2010  . Back surgery    . Lumbar fusion  08/2010  . Breast biopsy      right  . Lithotripsy      "several times"  . Parathyroidectomy  02/26/2012    Procedure: PARATHYROIDECTOMY;  Surgeon: Ascencion Dike, MD;  Location: [place];  Service: ENT;;  . Spine surgery  2011  . Nephrolithotomy Left 04/13/2014    Procedure: LEFT PERCUTANEOUS NEPHROLITHOTOMY WITH SURGEON ACCESS;  Surgeon: Ardis Hughs, MD;  Location: [facility];  Service: Urology;  Laterality: Left;  . Cystoscopy Left 04/13/2014    Procedure: CYSTOSCOPY FLEXIBLE;  Surgeon: Ardis Hughs, MD;  Location: [facility];  Service: Urology;  Laterality: Left;  with STENT    History   Social History  . Marital Status: Married    Spouse Name: N/A    Number of Children: 3  . Years of Education: N/A   Occupational History  . retired in [place]  . Smoking status: Never Smoker   . Smokeless tobacco: Never Used  . Alcohol Use: Yes     Comment: X2 DRINKS PER YEAR  . Drug Use: No  . Sexual Activity: Not Currently   Other Topics Concern  . Not on file   Social History Narrative  . No narrative on file     FAMILY HISTORY:   During the visit, a 4-generation pedigree was obtained. Family tree will be sent for scanning and will be in EPIC under the Media tab.  Significant diagnoses include the following:  Family History  Problem Relation Age of Onset  . Heart attack Mother   . Cancer Brother     Esophageal; smoker; deceased 68s  .  Anesthesia problems Neg Hx   . Leukemia Paternal Aunt     deceased 51s  . Pancreatic cancer Paternal Aunt     deceased 73  . Cancer Paternal Aunt     GI cancer; deceased 15s  . Cancer Cousin     kidney cancer; deceased 49; pat first cousin; daughter of aunt w/ leukemia  . Cancer Cousin     colon cancer @ 92; pat first cousin; son of aunt with GI cancer    Additionally, [name] has two daughters (ages 49 and 73). She has a living brother (age 29) and three sisters (ages 84, 84 and 48) who are all cancer-free. Her mother died in her 64s, cancer-free. She has no information about her maternal grandparents. Her father died in his 47s in an accident. Other than those above, he also had 2 sisters and 3 brothers who were all cancer-free. Several cousins have had cancer, but at later ages of onset.  [name] ancestry is African American. There is no known Jewish ancestry and no consanguinity.  ASSESSMENT AND PLAN: [name] is a 68 y.o. female with a personal history of breast cancer at age 4 and a paternal family history of various cancers. Even though she was diagnosed at a young age, this history is not suggestive of a hereditary predisposition to cancer given the constellation of cancers and the ages at diagnosis in her relatives. However, given her own history, genetic testing is indicated. We reviewed the characteristics, features and inheritance patterns of hereditary cancer syndromes. We also discussed genetic testing, including the process of testing, insurance coverage and implications of results. A negative result will be reassuring for her and her immediate family.  [name] wished to pursue genetic testing and a blood sample will be sent to [facility] for analysis of the 17 genes on the BreastNext gene panel. We discussed the implications of a positive, negative and/ or Variant of Uncertain Significance (VUS) result. Results should be available in approximately 4-5 weeks,  at which point we will contact her and address implications for her as well as address genetic testing for at-risk family members, if needed.    We encouraged [name] to remain in contact with Cancer Genetics annually so that we can update the family history and inform her of any changes in cancer genetics and testing that may be of benefit for this family. Ms.  Brown's questions were answered to her satisfaction today.   Thank you for the referral and allowing [place] to share in the care of your patient.   The patient was seen for a total of 30 minutes, greater than 50% of which was spent face-to-face counseling. This patient was discussed with the overseeing provider who agrees with the above.   Steele Berg, MS, [place] Certified Genetic Counseor phone: [phone] [email or web address]

## 2014-06-17 ENCOUNTER — Encounter (HOSPITAL_BASED_OUTPATIENT_CLINIC_OR_DEPARTMENT_OTHER): Payer: Medicare Other

## 2014-06-17 ENCOUNTER — Telehealth (HOSPITAL_COMMUNITY): Payer: Self-pay | Admitting: *Deleted

## 2014-06-17 VITALS — BP 134/61 | HR 83 | Temp 97.9°F | Resp 18

## 2014-06-17 DIAGNOSIS — D649 Anemia, unspecified: Secondary | ICD-10-CM

## 2014-06-17 DIAGNOSIS — E611 Iron deficiency: Secondary | ICD-10-CM

## 2014-06-17 MED ORDER — SODIUM CHLORIDE 0.9 % IV SOLN
510.0000 mg | Freq: Once | INTRAVENOUS | Status: AC
  Administered 2014-06-17: 510 mg via INTRAVENOUS
  Filled 2014-06-17: qty 17

## 2014-06-17 MED ORDER — SODIUM CHLORIDE 0.9 % IV SOLN
Freq: Once | INTRAVENOUS | Status: AC
  Administered 2014-06-17: 12:00:00 via INTRAVENOUS

## 2014-06-17 MED ORDER — SODIUM CHLORIDE 0.9 % IJ SOLN
10.0000 mL | INTRAMUSCULAR | Status: DC | PRN
  Administered 2014-06-17: 10 mL

## 2014-06-17 NOTE — Progress Notes (Signed)
Tolerated well

## 2014-06-17 NOTE — Telephone Encounter (Signed)
Message copied by Berneta Levins on Fri Jun 17, 2014  1:42 PM ------      Message from: Farrel Gobble A      Created: Fri Jun 17, 2014  1:04 PM       Please notify Ms. Halberg that no lab work will be necessary before her next visit in one year unless she calls with complaints that may need to be investigated.  Thanks.  ------

## 2014-06-17 NOTE — Telephone Encounter (Signed)
Message left on answering machine that she does not need to return for any lab work prior to her next appointment. Instructed patient that if she is having any problems or concerns to call the clinic and we will see her sooner if necessary.

## 2014-06-17 NOTE — Patient Instructions (Signed)
[facility] Discharge Instructions  RECOMMENDATIONS MADE BY THE CONSULTANT AND ANY TEST RESULTS WILL BE SENT TO YOUR REFERRING PHYSICIAN.  MEDICATIONS PRESCRIBED:  IV Feraheme infusion 2nd dose today.  INSTRUCTIONS/FOLLOW-UP: We will contact the doctor about when you need to return for lab work and we will call you to schedule the appointment.  Thank you for choosing [facility] to provide your oncology and hematology care.  To afford each patient quality time with our providers, please arrive at least 15 minutes before your scheduled appointment time.  With your help, our goal is to use those 15 minutes to complete the necessary work-up to ensure our physicians have the information they need to help with your evaluation and healthcare recommendations.    Effective January 1st, 2014, we ask that you re-schedule your appointment with our physicians should you arrive 10 or more minutes late for your appointment.  We strive to give you quality time with our providers, and arriving late affects you and other patients whose appointments are after yours.    Again, thank you for choosing [facility].  Our hope is that these requests will decrease the amount of time that you wait before being seen by our physicians.       _____________________________________________________________  Should you have questions after your visit to [facility], please contact our office at [phone] between the hours of 8:30 a.m. and 4:30 p.m.  Voicemails left after 4:30 p.m. will not be returned until the following business day.  For prescription refill requests, have your pharmacy contact our office with your prescription refill request.    _______________________________________________________________  We hope that we have given you very good care.  You may receive a patient satisfaction survey in the mail, please complete it and return it as soon as  possible.  We value your feedback!  _______________________________________________________________  Have you asked about our STAR program?  STAR stands for Survivorship Training and Rehabilitation, and this is a nationally recognized cancer care program that focuses on survivorship and rehabilitation.  Cancer and cancer treatments may cause problems, such as, pain, making you feel tired and keeping you from doing the things that you need or want to do. Cancer rehabilitation can help. Our goal is to reduce these troubling effects and help you have the best quality of life possible.  You may receive a survey from a nurse that asks questions about your current state of health.  Based on the survey results, all eligible patients will be referred to the [facility] program for an evaluation so we can better serve you!  A frequently asked questions sheet is available upon request.

## 2014-06-27 ENCOUNTER — Ambulatory Visit (INDEPENDENT_AMBULATORY_CARE_PROVIDER_SITE_OTHER): Payer: Medicare Other | Admitting: Family Medicine

## 2014-06-27 ENCOUNTER — Encounter: Payer: Self-pay | Admitting: Family Medicine

## 2014-06-27 VITALS — BP 138/74 | HR 78 | Temp 98.6°F | Resp 16 | Ht 66.0 in | Wt 206.0 lb

## 2014-06-27 DIAGNOSIS — I10 Essential (primary) hypertension: Secondary | ICD-10-CM

## 2014-06-27 DIAGNOSIS — E669 Obesity, unspecified: Secondary | ICD-10-CM

## 2014-06-27 DIAGNOSIS — E785 Hyperlipidemia, unspecified: Secondary | ICD-10-CM

## 2014-06-27 DIAGNOSIS — Z23 Encounter for immunization: Secondary | ICD-10-CM

## 2014-06-27 DIAGNOSIS — E119 Type 2 diabetes mellitus without complications: Secondary | ICD-10-CM

## 2014-06-27 DIAGNOSIS — K219 Gastro-esophageal reflux disease without esophagitis: Secondary | ICD-10-CM

## 2014-06-27 LAB — LIPID PANEL
Cholesterol: 200 mg/dL (ref 0–200)
HDL: 60 mg/dL (ref >39)
LDL Cholesterol: 110 mg/dL — ABNORMAL HIGH (ref 0–99)
Total CHOL/HDL Ratio: 3.3 Ratio
Triglycerides: 150 mg/dL — ABNORMAL HIGH (ref <150)
VLDL: 30 mg/dL (ref 0–40)

## 2014-06-27 LAB — HEMOGLOBIN A1C
HEMOGLOBIN A1C: 8.5 % — AB (ref <5.7)
Mean Plasma Glucose: 197 mg/dL — ABNORMAL HIGH (ref <117)

## 2014-06-27 NOTE — Patient Instructions (Signed)
We will call with lab results  Prevnar 13 shot  Try the dexilant, call if this helps your reflux and we will send in prescription F/U 3 months

## 2014-06-27 NOTE — Assessment & Plan Note (Signed)
Recheck lipids, intolerable of statins

## 2014-06-27 NOTE — Assessment & Plan Note (Signed)
Trial of dexilant, given samples from office 

## 2014-06-27 NOTE — Progress Notes (Signed)
Patient ID: [name], female   DOB: 04/04/1946, 68 y.o.   MRN: [id number]   Subjective:    Patient ID: [name], female    DOB: 25-Aug-1946, 68 y.o.   MRN: [id number]  Patient presents for 3 month F/U  Patient here follow chronic medical problems he is she's history diabetes mellitus her last A1c was 7.5% she missed her last set of labs. She's taking her meds as prescribed and doing better with the Januvia but is having some difficulty because of cough she is nearing the donut hole. She's taking all of her other medications as prescribed. Her fasting blood sugars are 1:30 to 140.  Acid reflux she continues to have difficulty with acid reflux she's tried multiple PPIs with normal improvement. She's been taking over-the-counter calcium like medication however note she did have surgery secondary to recurrent kidney stones and does not need to have elevated levels of calcium in the blood.   Review Of Systems:  GEN- denies fatigue, fever, weight loss,weakness, recent illness HEENT- denies eye drainage, change in vision, nasal discharge, CVS- denies chest pain, palpitations RESP- denies SOB, cough, wheeze ABD- denies N/V, change in stools, abd pain GU- denies dysuria, hematuria, dribbling, incontinence MSK- denies joint pain, muscle aches, injury Neuro- denies headache, dizziness, syncope, seizure activity       Objective:    BP 138/74  Pulse 78  Temp(Src) 98.6 F (37 C) (Oral)  Resp 16  Ht 5\' 6"  (1.676 m)  Wt 206 lb (93.441 kg)  BMI 33.27 kg/m2 GEN- NAD, alert and oriented x3 HEENT- PERRL, EOMI, non injected sclera, pink conjunctiva, MMM, oropharynx clear Neck- Supple,  CVS- RRR, no murmur RESP-CTAB ABD-NABS,soft,NT,ND EXT- No edema Pulses- Radial, DP- 2+        Assessment & Plan:      Problem List Items Addressed This Visit   Type 2 diabetes mellitus - Primary     Recheck A1C , goal A1C around 7, discussed dietary changes    Relevant Orders   Pneumococcal conjugate vaccine 13-valent (Completed)   Obesity   Hyperlipidemia     Recheck lipids, intolerable of statins    Relevant Orders      Pneumococcal conjugate vaccine 13-valent (Completed)   Essential hypertension   Relevant Orders      Pneumococcal conjugate vaccine 13-valent (Completed)    Other Visit Diagnoses   Need for prophylactic vaccination against Streptococcus pneumoniae (pneumococcus)        Relevant Orders       Pneumococcal conjugate vaccine 13-valent (Completed)       Note: This dictation was prepared with Dragon dictation along with smaller phrase technology. Any transcriptional errors that result from this process are unintentional.    .

## 2014-06-27 NOTE — Assessment & Plan Note (Signed)
Recheck A1C , goal A1C around 7, discussed dietary changes

## 2014-07-01 ENCOUNTER — Telehealth: Payer: Self-pay | Admitting: *Deleted

## 2014-07-01 NOTE — Telephone Encounter (Signed)
Received call from patient.   Reports that she is having dental work done soon (fillings and crown).   Reports that dentist is requesting clearance since she has recently had surgery.   MD please advise.

## 2014-07-03 ENCOUNTER — Encounter: Payer: Self-pay | Admitting: Family Medicine

## 2014-07-03 NOTE — Telephone Encounter (Signed)
Letter written

## 2014-07-06 ENCOUNTER — Encounter: Payer: Self-pay | Admitting: Genetic Counselor

## 2014-07-06 NOTE — Progress Notes (Signed)
GENETIC TEST RESULTS  Patient Name: [name] Patient Age: 68 y.o. Encounter Date: 07/06/2014  Referring Provider: Thomas Kefalas, PA   [name] was called today to discuss genetic test results. Please see the Genetics note from her visit on 06/14/14 for a detailed discussion of her personal and family history.  GENETIC TESTING: At the time of [name]'s visit, we recommended she pursue genetic testing of multiple genes on the BreastNext gene panel. This test, which included sequencing and deletion/duplication analysis of 17 genes, was performed at [facility]. Testing was normal and did not reveal a mutation in these genes. The genes tested were ATM, BARD1, BRCA1, BRCA2, BRIP1, CDH1, CHEK2, MRE11A, MUTYH, NBN, NF1, PALB2, PTEN, RAD50, RAD51C, RAD51D, and TP53.  We discussed with [name] that since the current test is not perfect, it is possible that there may be a gene mutation that current testing cannot detect, but that chance is small. We also discussed that it is possible that a different genetic factor, which was not part of this testing or has not yet been discovered, is responsible for the cancer diagnoses in the family. Given her family history, the likelihood of this is also low.     CANCER SCREENING: This result suggests that [name]'s cancer was most likely not due to an inherited predisposition. Most cancers happen by chance and this negative test, along with details of her family history, suggests that her cancer falls into this category. We, therefore, recommended she continue to follow the cancer screening guidelines provided by her physician. [name] reports that her last colonoscopy was in 2006 and was negative for polyps.  FAMILY MEMBERS: Women in the family are at some increased risk of developing breast cancer, over the general population risk, simply due to the family history. We recommended they have a yearly mammogram, a yearly clinical  breast exam, and perform monthly breast self-exams. A gynecologic exam is recommended yearly. Colon cancer screening is recommended to begin by age 50.  Lastly, we discussed with [name] that cancer genetics is a rapidly advancing field and it is possible that new genetic tests will be appropriate for her in the future. We encouraged her to remain in contact with us on an annual basis so we can update her personal and family histories, and let her know of advances in cancer genetics that may benefit the family. Our contact number was provided. [name]'s questions were answered to her satisfaction today, and she knows she is welcome to call anytime with additional questions.    Ofri Leitner, MS, CGC Certified Genetic Counseor phone: [phone] [email or web address] 

## 2014-07-13 ENCOUNTER — Telehealth: Payer: Self-pay | Admitting: Family Medicine

## 2014-07-13 MED ORDER — DEXLANSOPRAZOLE 60 MG PO CPDR: 60.0000 mg | DELAYED_RELEASE_CAPSULE | Freq: Every day | ORAL | Status: DC

## 2014-07-13 MED ORDER — FLUCONAZOLE 150 MG PO TABS: ORAL_TABLET | ORAL | Status: DC

## 2014-07-13 NOTE — Telephone Encounter (Signed)
Call returned to patient.   States that she was given samples of Invokana. Reports that she now has vaginal irritation and thick white discharge. Reports that she is soaking in sitz baths with salt and baking soda.   MD please advise.

## 2014-07-13 NOTE — Telephone Encounter (Signed)
Call placed to patient and patient made aware.   Prescription sent to pharmacy for [place] [facility] [place].

## 2014-07-13 NOTE — Telephone Encounter (Signed)
[phone]  PT is needing a refill on [facility]  PT is needing to talk to you about a diabetic medication

## 2014-07-13 NOTE — Telephone Encounter (Signed)
Send in diflucan 150mg  po x 1, repeat in 3 days If this reoccurs we may need to discontinue it

## 2014-08-03 ENCOUNTER — Ambulatory Visit: Payer: Self-pay | Admitting: Family Medicine

## 2014-09-12 ENCOUNTER — Encounter: Payer: Self-pay | Admitting: *Deleted

## 2014-09-27 ENCOUNTER — Other Ambulatory Visit: Payer: Self-pay | Admitting: Family Medicine

## 2014-09-27 NOTE — Telephone Encounter (Signed)
Medication filled x1 with no refills.   Requires office visit before any further refills can be given.   Letter sent.  

## 2014-10-10 ENCOUNTER — Telehealth: Payer: Self-pay | Admitting: *Deleted

## 2014-10-10 MED ORDER — NEBIVOLOL HCL 20 MG PO TABS: 20.0000 mg | ORAL_TABLET | Freq: Two times a day (BID) | ORAL | Status: DC

## 2014-10-10 NOTE — Telephone Encounter (Signed)
Received request from pharmacy for PA on Bystolic.   PA submitted.   Dx: [email or web address]- Essential HTN.

## 2014-10-10 NOTE — Telephone Encounter (Signed)
Received call from [facility].   Advised that patient formulary covers Bystolic 20mg  BID.   Prescription changed.

## 2014-10-13 ENCOUNTER — Other Ambulatory Visit: Payer: Self-pay | Admitting: Family Medicine

## 2014-10-13 NOTE — Telephone Encounter (Signed)
Refill appropriate and filled per protocol. 

## 2014-10-19 ENCOUNTER — Other Ambulatory Visit (HOSPITAL_COMMUNITY): Payer: Self-pay | Admitting: Oncology

## 2014-10-19 DIAGNOSIS — Z139 Encounter for screening, unspecified: Secondary | ICD-10-CM

## 2014-10-21 ENCOUNTER — Ambulatory Visit (INDEPENDENT_AMBULATORY_CARE_PROVIDER_SITE_OTHER): Payer: Medicare Other | Admitting: Family Medicine

## 2014-10-21 ENCOUNTER — Encounter: Payer: Self-pay | Admitting: Family Medicine

## 2014-10-21 VITALS — BP 136/82 | HR 84 | Temp 98.5°F | Resp 14 | Ht 66.0 in | Wt 202.0 lb

## 2014-10-21 DIAGNOSIS — E669 Obesity, unspecified: Secondary | ICD-10-CM

## 2014-10-21 DIAGNOSIS — I10 Essential (primary) hypertension: Secondary | ICD-10-CM

## 2014-10-21 DIAGNOSIS — E119 Type 2 diabetes mellitus without complications: Secondary | ICD-10-CM

## 2014-10-21 DIAGNOSIS — G4700 Insomnia, unspecified: Secondary | ICD-10-CM

## 2014-10-21 DIAGNOSIS — K219 Gastro-esophageal reflux disease without esophagitis: Secondary | ICD-10-CM

## 2014-10-21 DIAGNOSIS — E785 Hyperlipidemia, unspecified: Secondary | ICD-10-CM

## 2014-10-21 LAB — COMPREHENSIVE METABOLIC PANEL
ALT: 19 U/L (ref 0–35)
AST: 20 U/L (ref 0–37)
Albumin: 4 g/dL (ref 3.5–5.2)
Alkaline Phosphatase: 64 U/L (ref 39–117)
BUN: 7 mg/dL (ref 6–23)
CALCIUM: 9.8 mg/dL (ref 8.4–10.5)
CHLORIDE: 102 meq/L (ref 96–112)
CO2: 25 mEq/L (ref 19–32)
CREATININE: 0.87 mg/dL (ref 0.50–1.10)
Glucose, Bld: 209 mg/dL — ABNORMAL HIGH (ref 70–99)
Potassium: 3.9 mEq/L (ref 3.5–5.3)
SODIUM: 140 meq/L (ref 135–145)
Total Bilirubin: 0.7 mg/dL (ref 0.2–1.2)
Total Protein: 7.3 g/dL (ref 6.0–8.3)

## 2014-10-21 LAB — CBC WITH DIFFERENTIAL/PLATELET
Basophils Absolute: 0.1 10*3/uL (ref 0.0–0.1)
Basophils Relative: 1 % (ref 0–1)
EOS PCT: 3 % (ref 0–5)
Eosinophils Absolute: 0.3 10*3/uL (ref 0.0–0.7)
HCT: 38.4 % (ref 36.0–46.0)
Hemoglobin: 12.6 g/dL (ref 12.0–15.0)
LYMPHS PCT: 34 % (ref 12–46)
Lymphs Abs: 3.2 10*3/uL (ref 0.7–4.0)
MCH: 27.2 pg (ref 26.0–34.0)
MCHC: 32.8 g/dL (ref 30.0–36.0)
MCV: 82.9 fL (ref 78.0–100.0)
MONO ABS: 0.7 10*3/uL (ref 0.1–1.0)
MONOS PCT: 7 % (ref 3–12)
MPV: 10.2 fL (ref 8.6–12.4)
NEUTROS ABS: 5.2 10*3/uL (ref 1.7–7.7)
NEUTROS PCT: 55 % (ref 43–77)
Platelets: 226 10*3/uL (ref 150–400)
RBC: 4.63 MIL/uL (ref 3.87–5.11)
RDW: 14.4 % (ref 11.5–15.5)
WBC: 9.5 10*3/uL (ref 4.0–10.5)

## 2014-10-21 LAB — HEMOGLOBIN A1C
HEMOGLOBIN A1C: 8.7 % — AB (ref <5.7)
Mean Plasma Glucose: 203 mg/dL — ABNORMAL HIGH (ref <117)

## 2014-10-21 MED ORDER — SITAGLIPTIN PHOSPHATE 100 MG PO TABS: ORAL_TABLET | ORAL | Status: DC

## 2014-10-21 MED ORDER — LOSARTAN POTASSIUM 25 MG PO TABS: 25.0000 mg | ORAL_TABLET | Freq: Every morning | ORAL | Status: DC

## 2014-10-21 NOTE — Assessment & Plan Note (Signed)
Intolerable of statin drugs

## 2014-10-21 NOTE — Patient Instructions (Signed)
Try the Melatonin for sleep  Take your multivitamin for > 50   Continue current medications F/U 3 months

## 2014-10-21 NOTE — Progress Notes (Signed)
Patient ID: [name], female   DOB: 06/11/1946, 69 y.o.   MRN: [id number]   Subjective:    Patient ID: [name], female    DOB: 06/26/1946, 69 y.o.   MRN: [id number]  Patient presents for DM F/U patient here to follow-up chronic medical problems. Diabetes mellitus her blood sugars have been in the 160s to 170s however she has not been eating like she should has not been exercising. She has not had any hypoglycemia symptoms. She is taking her Januvia as prescribed. She is intolerable of statin drugs.  She's history of acid reflux was doing very well with Dexilant however there's been some problems with her insurance regarding this medication.  She's had some fatigue mostly during the day recently she is not sleeping as well at nighttime there is a lot of stress at home as her daughter was found to have lesions consistent with multiple sclerosis. She does not want to take anything prescribed for sleep.    Review Of Systems:  GEN- + fatigue, denies fever, weight loss,weakness, recent illness HEENT- denies eye drainage, change in vision, nasal discharge, CVS- denies chest pain, palpitations RESP- denies SOB, cough, wheeze ABD- denies N/V, change in stools, abd pain GU- denies dysuria, hematuria, dribbling, incontinence MSK- + joint pain, muscle aches, injury Neuro- denies headache, dizziness, syncope, seizure activity       Objective:    BP 136/82 mmHg  Pulse 84  Temp(Src) 98.5 F (36.9 C) (Oral)  Resp 14  Ht 5\' 6"  (1.676 m)  Wt 202 lb (91.627 kg)  BMI 32.62 kg/m2 GEN- NAD, alert and oriented x3 HEENT- PERRL, EOMI, non injected sclera, pink conjunctiva, MMM, oropharynx clear Neck- Supple, no thyromegaly CVS- RRR, no murmur RESP-CTAB Psych- normal affect and mood EXT- No edema Pulses- Radial 2+        Assessment & Plan:      Problem List Items Addressed This Visit      Unprioritized   Type 2 diabetes mellitus - Primary   Relevant Orders   CBC with  Differential/Platelet   Comprehensive metabolic panel   Hemoglobin A1c   Microalbumin / creatinine urine ratio   Insomnia   Hyperlipidemia   Essential hypertension   Relevant Orders   CBC with Differential/Platelet   Comprehensive metabolic panel      Note: This dictation was prepared with Dragon dictation along with smaller phrase technology. Any transcriptional errors that result from this process are unintentional.

## 2014-10-21 NOTE — Assessment & Plan Note (Signed)
Recheck A1c goal is A1c less than 7%. Note she did not tolerate metformin Discussed dietary changes that she can make

## 2014-10-21 NOTE — Assessment & Plan Note (Signed)
We'll try melatonin over-the-counter for her sleep there is a lot of stress at home with her daughter's recent diagnosis which I'm sure is contributing to these changes

## 2014-10-21 NOTE — Assessment & Plan Note (Signed)
She was given samples of Dexilnt from our office. She has failed omeprazole, Prevacid, Zantac alone, Protonix, Dexilant does work well for her

## 2014-10-21 NOTE — Assessment & Plan Note (Signed)
Blood pressure is well-controlled continue current regimen

## 2014-10-22 LAB — MICROALBUMIN / CREATININE URINE RATIO
Creatinine, Urine: 254.8 mg/dL
Microalb Creat Ratio: 22.4 mg/g (ref 0.0–30.0)
Microalb, Ur: 5.7 mg/dL — ABNORMAL HIGH (ref <2.0)

## 2014-10-24 ENCOUNTER — Telehealth: Payer: Self-pay | Admitting: *Deleted

## 2014-10-24 ENCOUNTER — Ambulatory Visit (HOSPITAL_COMMUNITY): Payer: Self-pay

## 2014-10-24 NOTE — Telephone Encounter (Signed)
Patient seen in office and requested PA for [place].   PA submitted.   Dx: K21.9

## 2014-10-25 ENCOUNTER — Other Ambulatory Visit: Payer: Self-pay | Admitting: *Deleted

## 2014-10-25 MED ORDER — GLIMEPIRIDE 2 MG PO TABS: 2.0000 mg | ORAL_TABLET | Freq: Every day | ORAL | Status: DC

## 2014-10-25 NOTE — Telephone Encounter (Signed)
Received call from pharmacy.   Advised that Bystolic 20mg  BID will need PA.   PA submitted.

## 2014-10-25 NOTE — Telephone Encounter (Signed)
Received fax from [facility].   No PA required for Dexilant.   Call placed to pharmacy.   Was advised that the last fill on Dexilant was 07/2014, and noted co- pay of $180.  Prescription claim ran today and noted co-pay of $100. Advised that co-pay is being applied to deductible.

## 2014-10-25 NOTE — Telephone Encounter (Signed)
noted 

## 2014-10-25 NOTE — Telephone Encounter (Signed)
Received PA determination.   Advised that no PA is needed.   Call placed to [facility] and was advised that Bystolic 20mg  BID is covered and will be allowed.   Call placed to pharmacy. Advised that when prescription is run, it comes back with message of "temporary supply given".

## 2014-10-26 ENCOUNTER — Other Ambulatory Visit: Payer: Self-pay | Admitting: *Deleted

## 2014-10-26 MED ORDER — ESOMEPRAZOLE MAGNESIUM 40 MG PO CPDR: 40.0000 mg | DELAYED_RELEASE_CAPSULE | Freq: Every day | ORAL | Status: DC

## 2014-10-27 ENCOUNTER — Encounter: Payer: Self-pay | Admitting: Internal Medicine

## 2014-10-28 ENCOUNTER — Telehealth: Payer: Self-pay | Admitting: Family Medicine

## 2014-10-28 NOTE — Telephone Encounter (Signed)
Confused about PPI  Got letter from her insurance.  Please call her at home.  You had done a PA for her

## 2014-10-28 NOTE — Telephone Encounter (Signed)
Called patient to inquire about what the letter from her insurance said.   [place].

## 2014-10-28 NOTE — Telephone Encounter (Signed)
Patient returned call.   States that she received PA determination stating that Dexilant is noted on formulary.   Advised that medication is on formulary, but remains Tier 3 medication. Patient has increased prescription deductible on Tier 3 medications.   Prescription has been changed to Tier 1 Esomeprazole.

## 2014-11-24 ENCOUNTER — Other Ambulatory Visit: Payer: Self-pay | Admitting: Family Medicine

## 2014-11-24 NOTE — Telephone Encounter (Signed)
Refill appropriate and filled per protocol. 

## 2014-12-14 ENCOUNTER — Other Ambulatory Visit: Payer: Self-pay | Admitting: Family Medicine

## 2014-12-14 DIAGNOSIS — Z1231 Encounter for screening mammogram for malignant neoplasm of breast: Secondary | ICD-10-CM

## 2014-12-19 ENCOUNTER — Ambulatory Visit (HOSPITAL_COMMUNITY)
Admission: RE | Admit: 2014-12-19 | Discharge: 2014-12-19 | Disposition: A | Discharge status: Home / Self Care | Payer: Medicare Other | Source: Ambulatory Visit | Attending: Family Medicine | Admitting: Family Medicine

## 2014-12-19 DIAGNOSIS — Z1231 Encounter for screening mammogram for malignant neoplasm of breast: Secondary | ICD-10-CM | POA: Diagnosis not present

## 2014-12-21 ENCOUNTER — Ambulatory Visit (HOSPITAL_COMMUNITY): Payer: Self-pay

## 2015-01-04 ENCOUNTER — Ambulatory Visit: Payer: Medicare Other | Admitting: Podiatry

## 2015-01-16 ENCOUNTER — Encounter: Payer: Self-pay | Admitting: Podiatry

## 2015-01-16 ENCOUNTER — Ambulatory Visit (INDEPENDENT_AMBULATORY_CARE_PROVIDER_SITE_OTHER): Payer: Medicare Other | Admitting: Podiatry

## 2015-01-16 VITALS — BP 140/78 | HR 76 | Resp 13 | Ht 66.5 in | Wt 200.0 lb

## 2015-01-16 DIAGNOSIS — B351 Tinea unguium: Secondary | ICD-10-CM | POA: Diagnosis not present

## 2015-01-16 DIAGNOSIS — E119 Type 2 diabetes mellitus without complications: Secondary | ICD-10-CM | POA: Diagnosis not present

## 2015-01-16 NOTE — Progress Notes (Signed)
   Subjective:    Patient ID: [name], female    DOB: 01/07/1946, 69 y.o.   MRN: [id number]  HPI Comments: N diabetic foot exam and debridement L 10 toenails D and O long-term C enlongated toenails A diabetes, and difficulty trimming T hx of debridement TFC 2014, and pt states the toenails were trimmed too short that's why she waited to come back.  Pt states she is beginning to get burning in the feet, worse at night for over 1year.  Diabetes   Patient denies any history of skin ulcers or claudication The last visit office was 2014   Review of Systems  Genitourinary:       Hx of impacted kidney per pt.  All other systems reviewed and are negative.      Objective:   Physical Exam  Orientated 3  Vascular: DP and PT pulses 2/4 bilaterally  Neurological: Sensation to 10 g monofilament wire intact 5/5 bilaterally Vibratory sensation intact bilaterally Ankle reflex equal and reactive bilaterally  Dermatological: The toenails are brittle, elongated, discolored 6-10  Musculoskeletal: Pes planus bilaterally HAV deformities bilaterally There is no restriction ankle, subtalar, midtarsal joints bilaterally      Assessment & Plan:   Assessment: Diabetic without complications Satisfactory neurovascular status Mycotic toenails 6-10  Plan: Debridement toenails 10 items 10 without any bleeding  Reappoint yearly or at patient's request

## 2015-01-16 NOTE — Patient Instructions (Signed)
Diabetes and Foot Care Diabetes may cause you to have problems because of poor blood supply (circulation) to your feet and legs. This may cause the skin on your feet to become thinner, break easier, and heal more slowly. Your skin may become dry, and the skin may peel and crack. You may also have nerve damage in your legs and feet causing decreased feeling in them. You may not notice minor injuries to your feet that could lead to infections or more serious problems. Taking care of your feet is one of the most important things you can do for yourself.  HOME CARE INSTRUCTIONS  Wear shoes at all times, even in the house. Do not go barefoot. Bare feet are easily injured.  Check your feet daily for blisters, cuts, and redness. If you cannot see the bottom of your feet, use a mirror or ask someone for help.  Wash your feet with warm water (do not use hot water) and mild soap. Then pat your feet and the areas between your toes until they are completely dry. Do not soak your feet as this can dry your skin.  Apply a moisturizing lotion or petroleum jelly (that does not contain alcohol and is unscented) to the skin on your feet and to dry, brittle toenails. Do not apply lotion between your toes.  Trim your toenails straight across. Do not dig under them or around the cuticle. File the edges of your nails with an emery board or nail file.  Do not cut corns or calluses or try to remove them with medicine.  Wear clean socks or stockings every day. Make sure they are not too tight. Do not wear knee-high stockings since they may decrease blood flow to your legs.  Wear shoes that fit properly and have enough cushioning. To break in new shoes, wear them for just a few hours a day. This prevents you from injuring your feet. Always look in your shoes before you put them on to be sure there are no objects inside.  Do not cross your legs. This may decrease the blood flow to your feet.  If you find a minor scrape,  cut, or break in the skin on your feet, keep it and the skin around it clean and dry. These areas may be cleansed with mild soap and water. Do not cleanse the area with peroxide, alcohol, or iodine.  When you remove an adhesive bandage, be sure not to damage the skin around it.  If you have a wound, look at it several times a day to make sure it is healing.  Do not use heating pads or hot water bottles. They may burn your skin. If you have lost feeling in your feet or legs, you may not know it is happening until it is too late.  Make sure your health care provider performs a complete foot exam at least annually or more often if you have foot problems. Report any cuts, sores, or bruises to your health care provider immediately. SEEK MEDICAL CARE IF:   You have an injury that is not healing.  You have cuts or breaks in the skin.  You have an ingrown nail.  You notice redness on your legs or feet.  You feel burning or tingling in your legs or feet.  You have pain or cramps in your legs and feet.  Your legs or feet are numb.  Your feet always feel cold. SEEK IMMEDIATE MEDICAL CARE IF:   There is increasing redness,   swelling, or pain in or around a wound.  There is a red line that goes up your leg.  Pus is coming from a wound.  You develop a fever or as directed by your health care provider.  You notice a bad smell coming from an ulcer or wound. Document Released: 09/06/2000 Document Revised: 05/12/2013 Document Reviewed: 02/16/2013 [employer] Patient Information 2015 [employer]. This information is not intended to replace advice given to you by your health care provider. Make sure you discuss any questions you have with your health care provider.  

## 2015-01-23 ENCOUNTER — Ambulatory Visit (HOSPITAL_COMMUNITY)
Admission: RE | Admit: 2015-01-23 | Discharge: 2015-01-23 | Disposition: A | Discharge status: Home / Self Care | Payer: Medicare Other | Source: Ambulatory Visit | Attending: Family Medicine | Admitting: Family Medicine

## 2015-01-23 ENCOUNTER — Ambulatory Visit (INDEPENDENT_AMBULATORY_CARE_PROVIDER_SITE_OTHER): Payer: Medicare Other | Admitting: Family Medicine

## 2015-01-23 ENCOUNTER — Other Ambulatory Visit: Payer: Self-pay | Admitting: Family Medicine

## 2015-01-23 ENCOUNTER — Ambulatory Visit: Payer: Medicare Other | Admitting: Family Medicine

## 2015-01-23 ENCOUNTER — Encounter: Payer: Self-pay | Admitting: Family Medicine

## 2015-01-23 VITALS — BP 138/70 | HR 76 | Temp 98.2°F | Resp 14 | Ht 66.5 in | Wt 207.0 lb

## 2015-01-23 DIAGNOSIS — E049 Nontoxic goiter, unspecified: Secondary | ICD-10-CM

## 2015-01-23 DIAGNOSIS — R21 Rash and other nonspecific skin eruption: Secondary | ICD-10-CM

## 2015-01-23 DIAGNOSIS — M19041 Primary osteoarthritis, right hand: Secondary | ICD-10-CM | POA: Diagnosis not present

## 2015-01-23 DIAGNOSIS — M25561 Pain in right knee: Secondary | ICD-10-CM | POA: Insufficient documentation

## 2015-01-23 DIAGNOSIS — S8991XA Unspecified injury of right lower leg, initial encounter: Secondary | ICD-10-CM | POA: Diagnosis not present

## 2015-01-23 DIAGNOSIS — W010XXA Fall on same level from slipping, tripping and stumbling without subsequent striking against object, initial encounter: Secondary | ICD-10-CM | POA: Diagnosis not present

## 2015-01-23 DIAGNOSIS — M25461 Effusion, right knee: Secondary | ICD-10-CM | POA: Insufficient documentation

## 2015-01-23 DIAGNOSIS — E785 Hyperlipidemia, unspecified: Secondary | ICD-10-CM | POA: Diagnosis not present

## 2015-01-23 DIAGNOSIS — I10 Essential (primary) hypertension: Secondary | ICD-10-CM

## 2015-01-23 DIAGNOSIS — E119 Type 2 diabetes mellitus without complications: Secondary | ICD-10-CM | POA: Diagnosis not present

## 2015-01-23 DIAGNOSIS — E669 Obesity, unspecified: Secondary | ICD-10-CM | POA: Diagnosis not present

## 2015-01-23 DIAGNOSIS — E010 Iodine-deficiency related diffuse (endemic) goiter: Secondary | ICD-10-CM

## 2015-01-23 LAB — LIPID PANEL
Cholesterol: 207 mg/dL — ABNORMAL HIGH (ref 0–200)
HDL: 59 mg/dL (ref >=46)
LDL CALC: 121 mg/dL — AB (ref 0–99)
TRIGLYCERIDES: 134 mg/dL (ref <150)
Total CHOL/HDL Ratio: 3.5 Ratio
VLDL: 27 mg/dL (ref 0–40)

## 2015-01-23 LAB — COMPREHENSIVE METABOLIC PANEL
ALT: 20 U/L (ref 0–35)
AST: 20 U/L (ref 0–37)
Albumin: 3.8 g/dL (ref 3.5–5.2)
Alkaline Phosphatase: 51 U/L (ref 39–117)
BUN: 7 mg/dL (ref 6–23)
CHLORIDE: 103 meq/L (ref 96–112)
CO2: 26 meq/L (ref 19–32)
Calcium: 9.9 mg/dL (ref 8.4–10.5)
Creat: 0.9 mg/dL (ref 0.50–1.10)
Glucose, Bld: 215 mg/dL — ABNORMAL HIGH (ref 70–99)
Potassium: 3.8 mEq/L (ref 3.5–5.3)
Sodium: 141 mEq/L (ref 135–145)
TOTAL PROTEIN: 7.2 g/dL (ref 6.0–8.3)
Total Bilirubin: 0.6 mg/dL (ref 0.2–1.2)

## 2015-01-23 LAB — HEMOGLOBIN A1C
Hgb A1c MFr Bld: 8.1 % — ABNORMAL HIGH (ref <5.7)
MEAN PLASMA GLUCOSE: 186 mg/dL — AB (ref <117)

## 2015-01-23 NOTE — Patient Instructions (Addendum)
Xray of knee to be done We will call with diabetes medications Try the sarna lotion for itching Take aleve 1 twice a day with food for your knee  Ice the knee  Ultrasound done on neck  F/U 3 months

## 2015-01-23 NOTE — Assessment & Plan Note (Signed)
Well controlled, no change to meds 

## 2015-01-23 NOTE — Assessment & Plan Note (Signed)
Appears she may be scratching in her sleep. All lesions healed today , no rash seen Have her use SARNA lotion

## 2015-01-23 NOTE — Assessment & Plan Note (Signed)
[place] of neck to be done

## 2015-01-23 NOTE — Progress Notes (Signed)
Patient ID: [name], female   DOB: [birth year], 69 y.o.   MRN: [id number]   Subjective:    Patient ID: [name], female    DOB: 1946-06-24, 69 y.o.   MRN: [id number]  Patient presents for 6 month F/U; R Knee Pain; and Arthritis Pain  patient to follow-up chronic medical problems. She's gained 7 pounds since her last visit. She has not been eating very well watching her diet. She is also not very active. She had a fall 3 weeks ago there was a rug bunched up and she tripped over it landing directly on her right knee she's had pain since then as well as some mild swelling.  Diabetes mellitus her last A1c was 8.7% [place] midparietal 2 mg to this. She says she typically eats most of her food late in the day between lunch and 1:00 AM.  She has scratches that come up in the morning. She's not sure she is scratching herself and her sleep is only on the side where she had her lymph nodes removed and she states that she does have dry skin. She has not ever seen any rash on the area.  Right hand third digit pain for the past few weeks. She was not sure if it was arthritis related. Denies any locking of the finger. She does get some swelling from time to time.    Review Of Systems:  GEN- denies fatigue, fever, weight loss,weakness, recent illness HEENT- denies eye drainage, change in vision, nasal discharge, CVS- denies chest pain, palpitations RESP- denies SOB, cough, wheeze ABD- denies N/V, change in stools, abd pain GU- denies dysuria, hematuria, dribbling, incontinence MSK- +joint pain, muscle aches, injury Neuro- denies headache, dizziness, syncope, seizure activity       Objective:    BP 138/70 mmHg  Pulse 76  Temp(Src) 98.2 F (36.8 C) (Oral)  Resp 14  Ht 5' 6.5" (1.689 m)  Wt 207 lb (93.895 kg)  BMI 32.91 kg/m2 GEN- NAD, alert and oriented x3 HEENT- PERRL, EOMI, non injected sclera, pink conjunctiva, MMM, oropharynx clear Neck- Supple, + thyromegaly- more on right  side CVS- RRR, no murmur RESP-CTAB Skin- muliple healed liners scratches MSK- bilat appearance knee normal, right small amount of fluid palpated medial aspect, TTP over patella, no bruising, fair ROM knee, no crepitus  RIght hand middle finger, normal apperance, no swan deformity, no swelling of MTP EXT- No edema Pulses- Radial, DP- 2+        Assessment & Plan:      Problem List Items Addressed This Visit    Type 2 diabetes mellitus    Concern for uncontrolled DM Will need to increase glimiperide or add injectable      Relevant Orders   Hemoglobin A1c   Thyromegaly    [place] of neck to be done      Relevant Orders   US Soft Tissue Head/Neck   Right knee injury - Primary- obtain xray, mild swelling, antcipate some bruising, r/o patell afracture  aleve BID, ICE knee   Relevant Orders   DG Knee Complete 4 Views Right (Completed)   Rash and nonspecific skin eruption    Appears she may be scratching in her sleep. All lesions healed today , no rash seen Have her use SARNA lotion      Obesity   Hyperlipidemia   Relevant Orders   Lipid panel   Essential hypertension    Well controlled, no change to meds      Relevant  Orders   CBC with Differential/Platelet   Comprehensive metabolic panel     OA finger- concern for OA, aleve prn    Note: This dictation was prepared with Dragon dictation along with smaller phrase technology. Any transcriptional errors that result from this process are unintentional.

## 2015-01-23 NOTE — Assessment & Plan Note (Signed)
Concern for uncontrolled DM Will need to increase glimiperide or add injectable

## 2015-01-24 LAB — CBC WITH DIFFERENTIAL/PLATELET
Basophils Absolute: 0.1 10*3/uL (ref 0.0–0.1)
Basophils Relative: 1 % (ref 0–1)
Eosinophils Absolute: 0.2 10*3/uL (ref 0.0–0.7)
Eosinophils Relative: 3 % (ref 0–5)
HEMATOCRIT: 36.9 % (ref 36.0–46.0)
Hemoglobin: 11.9 g/dL — ABNORMAL LOW (ref 12.0–15.0)
LYMPHS PCT: 37 % (ref 12–46)
Lymphs Abs: 2.9 10*3/uL (ref 0.7–4.0)
MCH: 26.9 pg (ref 26.0–34.0)
MCHC: 32.2 g/dL (ref 30.0–36.0)
MCV: 83.3 fL (ref 78.0–100.0)
MONO ABS: 0.5 10*3/uL (ref 0.1–1.0)
MPV: 9.8 fL (ref 8.6–12.4)
Monocytes Relative: 7 % (ref 3–12)
NEUTROS ABS: 4.1 10*3/uL (ref 1.7–7.7)
Neutrophils Relative %: 52 % (ref 43–77)
Platelets: 214 10*3/uL (ref 150–400)
RBC: 4.43 MIL/uL (ref 3.87–5.11)
RDW: 14.8 % (ref 11.5–15.5)
WBC: 7.8 10*3/uL (ref 4.0–10.5)

## 2015-01-25 ENCOUNTER — Ambulatory Visit (HOSPITAL_COMMUNITY)
Admission: RE | Admit: 2015-01-25 | Discharge: 2015-01-25 | Disposition: A | Discharge status: Home / Self Care | Payer: Medicare Other | Source: Ambulatory Visit | Attending: Family Medicine | Admitting: Family Medicine

## 2015-01-25 DIAGNOSIS — E010 Iodine-deficiency related diffuse (endemic) goiter: Secondary | ICD-10-CM

## 2015-01-25 DIAGNOSIS — E049 Nontoxic goiter, unspecified: Secondary | ICD-10-CM | POA: Diagnosis present

## 2015-01-26 LAB — T4, FREE: Free T4: 1.14 ng/dL (ref 0.80–1.80)

## 2015-01-26 LAB — TSH: TSH: 0.881 u[IU]/mL (ref 0.350–4.500)

## 2015-01-26 LAB — T3, FREE: T3, Free: 3.2 pg/mL (ref 2.3–4.2)

## 2015-01-27 ENCOUNTER — Other Ambulatory Visit: Payer: Self-pay | Admitting: *Deleted

## 2015-01-27 MED ORDER — GLIMEPIRIDE 2 MG PO TABS: 2.0000 mg | ORAL_TABLET | Freq: Two times a day (BID) | ORAL | Status: DC

## 2015-02-10 ENCOUNTER — Ambulatory Visit: Payer: Self-pay | Admitting: Internal Medicine

## 2015-02-17 LAB — HM DIABETES EYE EXAM

## 2015-02-22 ENCOUNTER — Encounter: Payer: Self-pay | Admitting: *Deleted

## 2015-03-01 ENCOUNTER — Other Ambulatory Visit: Payer: Self-pay | Admitting: Family Medicine

## 2015-03-01 NOTE — Telephone Encounter (Signed)
Refill appropriate and filled per protocol. 

## 2015-03-23 ENCOUNTER — Encounter: Payer: Self-pay | Admitting: Internal Medicine

## 2015-03-23 ENCOUNTER — Ambulatory Visit (INDEPENDENT_AMBULATORY_CARE_PROVIDER_SITE_OTHER): Payer: Medicare Other | Admitting: Internal Medicine

## 2015-03-23 VITALS — BP 132/74 | HR 73 | Ht 66.5 in | Wt 209.0 lb

## 2015-03-23 DIAGNOSIS — I10 Essential (primary) hypertension: Secondary | ICD-10-CM

## 2015-03-23 NOTE — Progress Notes (Signed)
Cardiology Office Note   Date:  03/23/2015   ID:  Genell, Thede [birth year], MRN [id number]  PCP:  [name], MD  Cardiologist:   Dorris Carnes, MD   No chief complaint on file.  Pt f/u for HTN and HL     History of Present Illness: [name] is a 69 y.o. female with a history of DOE, HTN, HL, DM, IBS.  Stress myoview 2013 was normal .  Hx of dizziness I saw her in clinic in 2014.    Since seen fell 3 months ago Hurt knee Stays active  Tired  Doesn't sleep well.   Daughter diagnosed with MS  Pt is under stress because of this.     Current Outpatient Prescriptions  Medication Sig Dispense Refill  . acetaminophen (TYLENOL) 325 MG tablet Take 650 mg by mouth every 6 (six) hours as needed for mild pain.    Marland Kitchen BYSTOLIC 20 MG TABS TAKE 1 TABLET BY MOUTH TWICE DAILY. 60 tablet 3  . CALCIUM & MAGNESIUM CARBONATES PO Take 1 tablet by mouth daily.    . Cholecalciferol (VITAMIN D-3) 5000 UNITS TABS Take by mouth daily.    Marland Kitchen CINNAMON PO Take 1 capsule by mouth daily.    Marland Kitchen docusate sodium 100 MG CAPS Take 100 mg by mouth 2 (two) times daily. 60 capsule 0  . glimepiride (AMARYL) 2 MG tablet Take 1 tablet (2 mg total) by mouth 2 (two) times daily. 60 tablet 3  . losartan (COZAAR) 25 MG tablet Take 1 tablet (25 mg total) by mouth every morning. 90 tablet 3  . Mouthwashes (BIOTENE DRY MOUTH MT) Use as directed 1 application in the mouth or throat 2 (two) times daily. USE 2 times DAILY FOR DRY MOUTH    . Multiple Vitamin (MULITIVITAMIN WITH MINERALS) TABS Take 1 tablet by mouth daily.    . ranitidine (ZANTAC) 150 MG tablet Take 150 mg by mouth daily as needed for heartburn.    . sitaGLIPtin (JANUVIA) 100 MG tablet TAKE (1) TABLET BY MOUTH DAILY. 90 tablet 3   No current facility-administered medications for this visit.    Allergies:   Demerol; Invokana; Metformin and related; Statins; and Ace inhibitors   Past Medical History  Diagnosis Date  . GERD (gastroesophageal  reflux disease)     mann  . Insomnia   . Hyperlipidemia   . Hypertension   . Obesity   . DJD (degenerative joint disease) of lumbar spine   . Degenerative disc disease, lumbar     pressing on L3 and L4  . IBS (irritable bowel syndrome)   . Hypersensitivity     in tongue  . Blood transfusion 1980's  . Hx: UTI (urinary tract infection)   . Complication of anesthesia     has a hard time waking up; can't lay flat  . PONV (postoperative nausea and vomiting)   . Dysrhythmia     "irregular"  . Heart murmur     "think I outgrew it"  . Shortness of breath     unable to lie flat  . Exertional dyspnea   . 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 Anemia   . Kidney stones 2009    s/p lithotripsy  . Abdominal hernia 02/26/12    unrepaired  . History of stomach ulcers   . 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  Kidney cysts     RT KIDNEY    Past Surgical History  Procedure Laterality Date  . Cesarean section  1973; 1978; 1981  . Urological surgery for blocked ureter secondary to kidney stone    . Appendectomy    . Implant and screws put in the right lower jaw  11/2008    dental surgery  . Eye surgery    . Ovarian cyst surgery    . Kidney blockage  ?1990's    " major surgery ;put kidney on pump for awhile"  . Mastectomy  1997    right  . Abdominal hysterectomy  2002  . Cataract extraction w/ intraocular lens  implant, bilateral  2009-2010  . Back surgery    . Lumbar fusion  08/2010  . Breast biopsy      right  . Lithotripsy      "several times"  . Parathyroidectomy  02/26/2012    Procedure: PARATHYROIDECTOMY;  Surgeon: Ascencion Dike, MD;  Location: [facility];  Service: ENT;;  . Spine surgery  2011  . Nephrolithotomy Left 04/13/2014    Procedure: LEFT PERCUTANEOUS NEPHROLITHOTOMY WITH SURGEON ACCESS;  Surgeon: Ardis Hughs, MD;  Location: [facility];  Service: Urology;  Laterality: Left;  . Cystoscopy Left  04/13/2014    Procedure: CYSTOSCOPY FLEXIBLE;  Surgeon: Ardis Hughs, MD;  Location: [facility];  Service: Urology;  Laterality: Left;  with STENT     Social History:  The patient  reports that she has never smoked. She has never used smokeless tobacco. She reports that she drinks alcohol. She reports that she does not use illicit drugs.   Family History:  The patient's family history includes Cancer in her brother, cousin, cousin, and paternal aunt; Heart attack in her mother; Leukemia in her paternal aunt; Pancreatic cancer in her paternal aunt. There is no history of Anesthesia problems.    ROS:  Please see the history of present illness. All other systems are reviewed and  Negative to the above problem except as noted.    PHYSICAL EXAM: VS:  BP 132/74 mmHg  Pulse 73  Ht 5' 6.5" (1.689 m)  Wt 209 lb (94.802 kg)  BMI 33.23 kg/m2  GEN: Well nourished, well developed, in no acute distress HEENT: normal Neck: no JVD, carotid bruits, or masses Cardiac: RRR; no murmurs, rubs, or gallops,no edema  Respiratory:  clear to auscultation bilaterally, normal work of breathing GI: soft, nontender, nondistended, + BS  No hepatomegaly  MS: no deformity Moving all extremities   Skin: warm and dry, no rash Neuro:  Strength and sensation are intact Psych: euthymic mood, full affect   EKG:  EKG is ordered today.  Sr 73 bpm  T wave inversion V3 to V6, II , III AVF   Lipid Panel    Component Value Date/Time   CHOL 207* 01/23/2015 1044   TRIG 134 01/23/2015 1044   HDL 59 01/23/2015 1044   CHOLHDL 3.5 01/23/2015 1044   VLDL 27 01/23/2015 1044   LDLCALC 121* 01/23/2015 1044      Wt Readings from Last 3 Encounters:  03/23/15 209 lb (94.802 kg)  01/23/15 207 lb (93.895 kg)  01/16/15 200 lb (90.719 kg)      ASSESSMENT AND PLAN:  1  HTN  BP is good  Keep on same meds  2.  HL  LDL is a little higher than previous  Pt admits to eating more  Wt up  Due to stres Will work with wt  watcher.     F/U in 1 year     Signed, Dorris Carnes, MD  03/23/2015 3:05 PM    [facility] [place], [place], [place]  [address] Phone: [phone]; Fax: [phone]

## 2015-03-23 NOTE — Patient Instructions (Signed)
Medication Instructions:  Your physician recommends that you continue on your current medications as directed. Please refer to the Current Medication list given to you today.   Labwork: NONE  Testing/Procedures: NONE  Follow-Up: Your physician wants you to follow-up in: 12 months with Dr. Harrington Challenger. You will receive a reminder letter in the mail two months in advance. If you don't receive a letter, please call our office to schedule the follow-up appointment.   Any Other Special Instructions Will Be Listed Below (If Applicable).

## 2015-04-26 ENCOUNTER — Ambulatory Visit (INDEPENDENT_AMBULATORY_CARE_PROVIDER_SITE_OTHER): Payer: Medicare Other | Admitting: Family Medicine

## 2015-04-26 ENCOUNTER — Encounter: Payer: Self-pay | Admitting: Family Medicine

## 2015-04-26 VITALS — BP 134/68 | HR 76 | Temp 98.4°F | Resp 14 | Ht 67.0 in | Wt 207.0 lb

## 2015-04-26 DIAGNOSIS — E669 Obesity, unspecified: Secondary | ICD-10-CM | POA: Diagnosis not present

## 2015-04-26 DIAGNOSIS — E785 Hyperlipidemia, unspecified: Secondary | ICD-10-CM

## 2015-04-26 DIAGNOSIS — E119 Type 2 diabetes mellitus without complications: Secondary | ICD-10-CM | POA: Diagnosis not present

## 2015-04-26 DIAGNOSIS — F418 Other specified anxiety disorders: Secondary | ICD-10-CM | POA: Diagnosis not present

## 2015-04-26 DIAGNOSIS — I10 Essential (primary) hypertension: Secondary | ICD-10-CM | POA: Diagnosis not present

## 2015-04-26 LAB — CBC WITH DIFFERENTIAL/PLATELET
BASOS ABS: 0.1 10*3/uL (ref 0.0–0.1)
BASOS PCT: 1 % (ref 0–1)
Eosinophils Absolute: 0.2 10*3/uL (ref 0.0–0.7)
Eosinophils Relative: 2 % (ref 0–5)
HCT: 36.7 % (ref 36.0–46.0)
Hemoglobin: 12.5 g/dL (ref 12.0–15.0)
LYMPHS ABS: 3 10*3/uL (ref 0.7–4.0)
Lymphocytes Relative: 32 % (ref 12–46)
MCH: 27.4 pg (ref 26.0–34.0)
MCHC: 34.1 g/dL (ref 30.0–36.0)
MCV: 80.3 fL (ref 78.0–100.0)
MONO ABS: 0.8 10*3/uL (ref 0.1–1.0)
MPV: 9.6 fL (ref 8.6–12.4)
Monocytes Relative: 8 % (ref 3–12)
Neutro Abs: 5.4 10*3/uL (ref 1.7–7.7)
Neutrophils Relative %: 57 % (ref 43–77)
PLATELETS: 227 10*3/uL (ref 150–400)
RBC: 4.57 MIL/uL (ref 3.87–5.11)
RDW: 14.5 % (ref 11.5–15.5)
WBC: 9.4 10*3/uL (ref 4.0–10.5)

## 2015-04-26 LAB — HEMOGLOBIN A1C
Hgb A1c MFr Bld: 8 % — ABNORMAL HIGH (ref <5.7)
MEAN PLASMA GLUCOSE: 183 mg/dL — AB (ref <117)

## 2015-04-26 LAB — COMPREHENSIVE METABOLIC PANEL
ALBUMIN: 3.9 g/dL (ref 3.6–5.1)
ALT: 19 U/L (ref 6–29)
AST: 19 U/L (ref 10–35)
Alkaline Phosphatase: 45 U/L (ref 33–130)
BUN: 8 mg/dL (ref 7–25)
CHLORIDE: 105 mmol/L (ref 98–110)
CO2: 25 mmol/L (ref 20–31)
Calcium: 9.2 mg/dL (ref 8.6–10.4)
Creat: 0.93 mg/dL (ref 0.50–0.99)
Glucose, Bld: 189 mg/dL — ABNORMAL HIGH (ref 70–99)
POTASSIUM: 4 mmol/L (ref 3.5–5.3)
SODIUM: 141 mmol/L (ref 135–146)
Total Bilirubin: 0.5 mg/dL (ref 0.2–1.2)
Total Protein: 7.2 g/dL (ref 6.1–8.1)

## 2015-04-26 LAB — LIPID PANEL
Cholesterol: 199 mg/dL (ref 125–200)
HDL: 56 mg/dL (ref >=46)
LDL CALC: 119 mg/dL (ref <130)
TRIGLYCERIDES: 118 mg/dL (ref <150)
Total CHOL/HDL Ratio: 3.6 Ratio (ref <=5.0)
VLDL: 24 mg/dL (ref <30)

## 2015-04-26 MED ORDER — LORAZEPAM 0.5 MG PO TABS: 0.5000 mg | ORAL_TABLET | Freq: Every evening | ORAL | Status: DC | PRN

## 2015-04-26 NOTE — Progress Notes (Signed)
Patient ID: [name], female   DOB: 02/20/1946, 69 y.o.   MRN: [id number]   Subjective:    Patient ID: [name], female    DOB: 1945/12/10, 69 y.o.   MRN: [id number]  Patient presents for 3 month F/U  patient to follow-up chronic medical problems. Diabetes mellitus her blood sugars fasting have been 160-200 she is also put on weight over the past 6 months. She states that there's been a lot of stress as her youngest daughter was recently diagnosed with multiple sclerosis and there've been a lot of treatments and things. She finds that she has been journaling and around 4:00 every afternoon is when her anxiety starts to become overwhelming she starts thinking about her daughter who passed away around 4:00 10 years ago the stressors of her current daughter situation. Her mind will also not turn off at night and she is very difficult time sleeping. She was to try something that she can use as needed to help with her nerves. She does have a very supportive family.  Medications reviewed due for fasting labs    Review Of Systems:  GEN- + fatigue, fever, weight loss,weakness, recent illness HEENT- denies eye drainage, change in vision, nasal discharge, CVS- denies chest pain, palpitations RESP- denies SOB, cough, wheeze ABD- denies N/V, change in stools, abd pain GU- denies dysuria, hematuria, dribbling, incontinence MSK- denies joint pain, muscle aches, injury Neuro- denies headache, dizziness, syncope, seizure activity       Objective:    BP 134/68 mmHg  Pulse 76  Temp(Src) 98.4 F (36.9 C) (Oral)  Resp 14  Ht 5\' 7"  (1.702 m)  Wt 207 lb (93.895 kg)  BMI 32.41 kg/m2 GEN- NAD, alert and oriented x3 HEENT- PERRL, EOMI, non injected sclera, pink conjunctiva, MMM, oropharynx clear Neck- Supple,+ thyromegaly CVS- RRR, no murmur RESP-CTAB Psych- normal affect and mood, not anxious appearing EXT- No edema Pulses- Radial, DP- 2+        Assessment & Plan:      Problem  List Items Addressed This Visit    Type 2 diabetes mellitus - Primary   Relevant Orders   CBC with Differential/Platelet   Comprehensive metabolic panel   Hemoglobin A1c   Situational anxiety   Relevant Medications   LORazepam (ATIVAN) 0.5 MG tablet   Hyperlipidemia   Relevant Orders   Lipid panel   Essential hypertension      Note: This dictation was prepared with Dragon dictation along with smaller phrase technology. Any transcriptional errors that result from this process are unintentional.

## 2015-04-26 NOTE — Patient Instructions (Signed)
Nutrition referral  Try the ativan for your nerves in the evening  F/U 3 months

## 2015-04-27 ENCOUNTER — Encounter: Payer: Self-pay | Admitting: Family Medicine

## 2015-04-27 NOTE — Assessment & Plan Note (Signed)
Trial of ativan, will see if she requires often, if so will change to SSRI,

## 2015-04-27 NOTE — Assessment & Plan Note (Signed)
Well controlled, no change to medications 

## 2015-04-27 NOTE — Assessment & Plan Note (Signed)
Goal is A1C less than 7 %, dietary habits of concern, she agrees to see nutritionist, recheck labs today, she wants to avoid injectables but that will be next step as we have exhausted the oral medications

## 2015-05-16 ENCOUNTER — Other Ambulatory Visit: Payer: Self-pay | Admitting: Family Medicine

## 2015-05-16 NOTE — Telephone Encounter (Signed)
Medication refilled per protocol. 

## 2015-05-19 ENCOUNTER — Ambulatory Visit (HOSPITAL_COMMUNITY): Payer: Medicare Other | Admitting: Hematology & Oncology

## 2015-05-19 ENCOUNTER — Ambulatory Visit (HOSPITAL_COMMUNITY): Payer: Medicare Other | Admitting: Oncology

## 2015-06-08 ENCOUNTER — Other Ambulatory Visit: Payer: Self-pay | Admitting: Family Medicine

## 2015-06-08 NOTE — Telephone Encounter (Signed)
Refill appropriate and filled per protocol. 

## 2015-06-12 ENCOUNTER — Ambulatory Visit (HOSPITAL_COMMUNITY): Payer: Medicare Other | Admitting: Hematology & Oncology

## 2015-06-12 NOTE — Progress Notes (Signed)
This encounter was created in error - please disregard.

## 2015-06-19 ENCOUNTER — Encounter (HOSPITAL_COMMUNITY): Payer: Self-pay

## 2015-06-26 ENCOUNTER — Encounter: Payer: Self-pay | Admitting: Dietician

## 2015-06-26 ENCOUNTER — Encounter: Payer: Medicare Other | Attending: Family Medicine | Admitting: Dietician

## 2015-06-26 VITALS — Ht 66.5 in | Wt 210.0 lb

## 2015-06-26 DIAGNOSIS — Z713 Dietary counseling and surveillance: Secondary | ICD-10-CM | POA: Diagnosis not present

## 2015-06-26 DIAGNOSIS — E119 Type 2 diabetes mellitus without complications: Secondary | ICD-10-CM | POA: Diagnosis present

## 2015-06-26 NOTE — Progress Notes (Signed)
Diabetes Self-Management Education  Visit Type:    Appt. Start Time: 1115 Appt. End Time: 1245  06/26/2015  [name], identified by name and date of birth, is a 69 y.o. female with a diagnosis of Diabetes:  .   Patient is here with her daughter.  She would like to better control her blood sugar, lose weight and learn to eat healthier.  Daughter has diabetes as well.  Patient lives with her husband and daughter is back in the home after being diagnosed with MS.  She does the shopping and the cooking.  Hx includes aggressive breast cancer diagnosed in 1997 and now in remission, IBS (agrivated by some vegetables and grease), GERD and large hernia.  She reports lactose intolerance.  She states that she is very energetic and highly competitive.  She only gets 4 hours some nights.  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    She has had type 2 diabetes since 1997 ("due to aggressive cancer therapy" per pt) and is very frustrated by her blood sugars.  She only checks her blood sugar 1-2 times per week due to dislike of needles but mostly due to being scared of the blood sugar results that she gets.  Her meal schedule is erratic, skipping meals at times and having snacks instead.  She currently does not walk often.  Daughter is very supportive and they walk together at times.  Patient states that they have a lot of exercise equipment in their home.    ASSESSMENT  Height 5' 6.5" (1.689 m), weight 210 lb (95.255 kg). Body mass index is 33.39 kg/(m^2).      Diabetes Self-Management Education - 06/26/15 1124    Visit Information   Visit Type (p) First/Initial   Initial Visit   Diabetes Type (p) Type 2   Are you currently following a meal plan? (p) No   Are you taking your medications as prescribed? (p) Yes   Date Diagnosed  (p) [place]   How would you rate your overall health? (p) Fair   Psychosocial Assessment   Patient Belief/Attitude about Diabetes (p) Motivated to manage diabetes   Self-care barriers (p) None   Self-management support (p) Doctor's office;Friends;Family   Other persons present (p) Patient;Family Member  daughter   Patient Concerns (p) Nutrition/Meal planning;Glycemic Control;Weight Control   Special Needs (p) None   Preferred Learning Style (p) No preference indicated   Learning Readiness (p) Ready   How often do you need to have someone help you when you read instructions, pamphlets, or other written materials from your doctor or pharmacy? (p) 1 - Never   What is the last grade level you completed in school? (p) 6 years college   Complications   Last HgB A1C per patient/outside source (p) 8 %  04/26/15   How often do you check your blood sugar? (p) 0 times/day (not testing)  twice per week   Fasting Blood glucose range (mg/dL) (p) 180-200   Postprandial Blood glucose range (mg/dL) (p) >200   Number of hypoglycemic episodes per month (p) 0   Number of hyperglycemic episodes per week (p) 2   Can you tell when your blood sugar is high? (p) Yes   What do you do if your blood sugar is high? (p) reduce food intake, increase water  Have you had a dilated eye exam in the past 12 months? (p) Yes   Have you had a dental exam in the past 12 months? (p) Yes   Are you checking your feet? (p) Yes   How many days per week are you checking your feet? (p) 7   Dietary Intake   Breakfast (p) coffee with cream and 2 tsp sugar  8   Snack (morning) (p) NABS  10   Lunch (p) skips or leftovers OR salad OR chicken wings at [employer] or [employer]  2-3   Snack (afternoon) (p) nothing or chex mix or popcorn or banana or other fruit   Dinner (p) sweet potatoes, green beans, baked turykey wings OR tomato saundwhich OR salad ( OFTEN the lightest meal of the day)  7   Snack (evening) (p)  chocolate or popcorn or chex mix   Beverage(s) (p) coffee with cream and 2 tesp sugar, 8 oz regular coke daily, (used to dring 4-6 per day), sweet tea occasionally, water (forcing as dislikes)   Exercise   Exercise Type (p) Light (walking / raking leaves)   How many days per week to you exercise? (p) 1   How many minutes per day do you exercise? (p) 20   Total minutes per week of exercise (p) 20      Individualized Plan for Diabetes Self-Management Training:   Learning Objective:  Patient will have a greater understanding of diabetes self-management. Patient education plan is to attend individual and/or group sessions per assessed needs and concerns.   Plan:   Patient Instructions  Be mindful of what you are drinking.  Does it have carbohydrates? Aim for 3 Carb Choices per meal (45 grams) +/- 1 either way  Aim for 0-1 Carbs per snack if hungry  Include protein in moderation with your meals and snacks Consider reading food labels for Total Carbohydrate and Fat Grams of foods Consider  increasing your activity level by walking for 30 minutes daily as tolerated Consider checking BG at alternate times per day as directed by MD  Consider taking medication as directed by MD    Expected Outcomes:   Demonstrated interest in learning.  Expect positive outcomes.  Education material provided: Living Well with Diabetes, Food label handouts, A1C conversion sheet, Meal plan card, My Plate and Snack sheet, breakfast ideas  If problems or questions, patient to contact team via:  Phone and Email  Future DSME appointment:

## 2015-06-26 NOTE — Patient Instructions (Signed)
Be mindful of what you are drinking.  Does it have carbohydrates? Aim for 3 Carb Choices per meal (45 grams) +/- 1 either way  Aim for 0-1 Carbs per snack if hungry  Include protein in moderation with your meals and snacks Consider reading food labels for Total Carbohydrate and Fat Grams of foods Consider  increasing your activity level by walking for 30 minutes daily as tolerated Consider checking BG at alternate times per day as directed by MD  Consider taking medication as directed by MD

## 2015-07-04 ENCOUNTER — Other Ambulatory Visit: Payer: Self-pay | Admitting: Family Medicine

## 2015-07-04 NOTE — Telephone Encounter (Signed)
Refill appropriate and filled per protocol. 

## 2015-07-13 ENCOUNTER — Encounter (HOSPITAL_COMMUNITY): Payer: Medicare Other | Attending: Hematology & Oncology | Admitting: Hematology & Oncology

## 2015-07-13 ENCOUNTER — Encounter (HOSPITAL_COMMUNITY): Payer: Self-pay | Admitting: Hematology & Oncology

## 2015-07-13 VITALS — BP 144/83 | HR 74 | Temp 98.4°F | Resp 20 | Wt 210.8 lb

## 2015-07-13 DIAGNOSIS — C50919 Malignant neoplasm of unspecified site of unspecified female breast: Secondary | ICD-10-CM

## 2015-07-13 DIAGNOSIS — Z853 Personal history of malignant neoplasm of breast: Secondary | ICD-10-CM | POA: Diagnosis not present

## 2015-07-13 DIAGNOSIS — Z0000 Encounter for general adult medical examination without abnormal findings: Secondary | ICD-10-CM

## 2015-07-13 DIAGNOSIS — Z23 Encounter for immunization: Secondary | ICD-10-CM

## 2015-07-13 MED ORDER — INFLUENZA VAC SPLIT QUAD 0.5 ML IM SUSY
0.5000 mL | PREFILLED_SYRINGE | Freq: Once | INTRAMUSCULAR | Status: AC
  Administered 2015-07-13: 0.5 mL via INTRAMUSCULAR
  Filled 2015-07-13: qty 0.5

## 2015-07-13 NOTE — Patient Instructions (Signed)
..  [place] at [facility] Discharge Instructions  RECOMMENDATIONS MADE BY THE CONSULTANT AND ANY TEST RESULTS WILL BE SENT TO YOUR REFERRING PHYSICIAN.  Exam per Dr. Whitney Muse Flu vaccine today Return in 1 year Genetics will contact you about an appointment  Thank you for choosing [place] at [facility] to provide your oncology and hematology care.  To afford each patient quality time with our provider, please arrive at least 15 minutes before your scheduled appointment time.    You need to re-schedule your appointment should you arrive 10 or more minutes late.  We strive to give you quality time with our providers, and arriving late affects you and other patients whose appointments are after yours.  Also, if you no show three or more times for appointments you may be dismissed from the clinic at the providers discretion.     Again, thank you for choosing [facility].  Our hope is that these requests will decrease the amount of time that you wait before being seen by our physicians.       _____________________________________________________________  Should you have questions after your visit to [facility], please contact our office at [phone] between the hours of 8:30 a.m. and 4:30 p.m.  Voicemails left after 4:30 p.m. will not be returned until the following business day.  For prescription refill requests, have your pharmacy contact our office.

## 2015-07-13 NOTE — Progress Notes (Signed)
[name] presents today for injection per the provider's orders.  Flu vaccine administration without incident; see MAR for injection details.  Patient tolerated procedure well and without incident.  No questions or complaints noted at this time.

## 2015-07-13 NOTE — Progress Notes (Signed)
Nicole Blackbird, MD [address] [place] [address]  Preventative health care - Plan: Influenza vac split quadrivalent PF (FLUARIX) injection 0.5 mL  CURRENT THERAPY: Observation  INTERVAL HISTORY: [name] 69 y.o. female returns for  regular  visit for followup of right-sided breast cancer measuring 1.4 x 1.2 cm, grade 2, 8 negative axillary lymph nodes for metastatic disease, ER and PR positive. S/P right mastectomy on 10/17/1995, followed by adjuvant chemotherapy consisting of AC x 4 cycles followed by Tamoxifen for 5 years ending as of 04/09/2001.   She was diagnosed at the age of 19. She has never had genetics testing. She reports a family history of upper GI cancer, colon concer, prostate cancer, leukemia, etc. She denies any ovarian or endometrial cancers that she is aware of. I have asked her to make a list of family member who have had cancer in preparation for this appointment.   Oncologically, she denies any complaints and ROS questioning is negative.   The patient is here alone today.  She reports that she has an " growing alien" in her stomach noting that it is a hernia.  She has only consulted her primary about this issue.  She has complaints of heart burn.  Her oldest daughter has just been diagnosed with MS.  She notes that she has a goiter.  Her primary follows her Thyroid levels.  She complains of itching in her R axilla.  She has been using goat milk and cortisone. Oncologically she denies any other complaints.   Her colonoscopies are completed here by Dr. Oneida Alar.  She will have her flu vaccination today.   Past Medical History  Diagnosis Date  . GERD (gastroesophageal reflux disease)     mann  . Insomnia   . Hyperlipidemia   . Hypertension   . Obesity   . DJD (degenerative joint disease) of lumbar spine   . Degenerative disc disease, lumbar     pressing on L3 and L4  . IBS (irritable bowel syndrome)   . Hypersensitivity     in tongue  . Blood  transfusion 1980's  . Hx: UTI (urinary tract infection)   . Complication of anesthesia     has a hard time waking up; can't lay flat  . PONV (postoperative nausea and vomiting)   . Dysrhythmia     "irregular"  . Heart murmur     "think I outgrew it"  . Shortness of breath     unable to lie flat  . Exertional dyspnea   . Adenocarcinoma of breast ([place]) 1997    right / chemo + tamoxifen x 5 years   . Anemia   . Kidney stones 2009    s/p lithotripsy  . Abdominal hernia 02/26/12    unrepaired  . History of stomach ulcers   . Diabetes mellitus ([place]) 1989    Type 2 NIDDM; "cancer treatment gave me diabetes"  . Kidney cysts     RT KIDNEY    has ADENOCARCINOMA, BREAST, RIGHT; Hyperlipidemia; Obesity; Essential hypertension; Esophageal reflux; GASTROPARESIS; IBS; Insomnia; FATIGUE; Type 2 diabetes mellitus ([place]); Back pain with left-sided sciatica; DDD (degenerative disc disease), lumbar; Bulge of lumbar disc without myelopathy; Renal cyst; Kidney stone; Nephrolithiasis; Iron deficiency; Right knee injury; Thyromegaly; Rash and nonspecific skin eruption; and Situational anxiety on her problem list.     is allergic to demerol; invokana; metformin and related; statins; and ace inhibitors.  Ms. Couts does not currently have medications on  file.  Past Surgical History  Procedure Laterality Date  . Cesarean section  1973; 1978; 1981  . Urological surgery for blocked ureter secondary to kidney stone    . Appendectomy    . Implant and screws put in the right lower jaw  11/2008    dental surgery  . Eye surgery    . Ovarian cyst surgery    . Kidney blockage  ?1990's    " major surgery ;put kidney on pump for awhile"  . Mastectomy  1997    right  . Abdominal hysterectomy  2002  . Cataract extraction w/ intraocular lens  implant, bilateral  2009-2010  . Back surgery    . Lumbar fusion  08/2010  . Breast biopsy      right  . Lithotripsy      "several times"  . Parathyroidectomy   02/26/2012    Procedure: PARATHYROIDECTOMY;  Surgeon: Ascencion Dike, MD;  Location: [place];  Service: ENT;;  . Spine surgery  2011  . Nephrolithotomy Left 04/13/2014    Procedure: LEFT PERCUTANEOUS NEPHROLITHOTOMY WITH SURGEON ACCESS;  Surgeon: Ardis Hughs, MD;  Location: [facility];  Service: Urology;  Laterality: Left;  . Cystoscopy Left 04/13/2014    Procedure: CYSTOSCOPY FLEXIBLE;  Surgeon: Ardis Hughs, MD;  Location: [facility];  Service: Urology;  Laterality: Left;  with STENT    Denies any headaches, dizziness, double vision, fevers, chills, night sweats, nausea, vomiting, diarrhea, constipation, chest pain, heart palpitations, shortness of breath, blood in stool, black tarry stool, urinary pain, urinary burning, urinary frequency, hematuria. 14 point review of systems was performed and is negative except as detailed under history of present illness and above   PHYSICAL EXAMINATION  ECOG PERFORMANCE STATUS: 0 - Asymptomatic  Filed Vitals:   07/13/15 1034  BP: 144/83  Pulse: 74  Temp: 98.4 F (36.9 C)  Resp: 20    GENERAL:alert, no distress, well nourished, well developed, comfortable, cooperative and smiling SKIN: skin color, texture, turgor are normal, no rashes or significant lesions HEAD: Normocephalic, No masses, lesions, tenderness or abnormalities EYES: normal, PERRLA, EOMI, Conjunctiva are pink and non-injected EARS: External ears normal OROPHARYNX:lips, buccal mucosa, and tongue normal and mucous membranes are moist  NECK: supple, no adenopathy, thyroid normal size, non-tender, without nodularity, no stridor, non-tender, trachea midline LYMPH:  no palpable lymphadenopathy, no hepatosplenomegaly BREAST:left breast normal without mass, skin or nipple changes or axillary nodes, right post-mastectomy site well healed and free of suspicious changes LUNGS: clear to auscultation and percussion HEART: regular rate & rhythm, no murmurs, no gallops, S1 normal and S2  normal ABDOMEN:abdomen soft, non-tender, obese, normal bowel sounds, no masses or organomegaly and large ventral hernia BACK: Back symmetric, no curvature., No CVA tenderness EXTREMITIES:less then 2 second capillary refill, no joint deformities, effusion, or inflammation, no edema, no skin discoloration, no clubbing, no cyanosis  NEURO: alert & oriented x 3 with fluent speech, no focal motor/sensory deficits, gait normal   LABORATORY DATA: I have reviewed the data below as listed. CBC    Component Value Date/Time   WBC 9.4 04/26/2015 1116   RBC 4.57 04/26/2015 1116   RBC 4.77 12/01/2012 1214   HGB 12.5 04/26/2015 1116   HCT 36.7 04/26/2015 1116   PLT 227 04/26/2015 1116   MCV 80.3 04/26/2015 1116   MCH 27.4 04/26/2015 1116   MCHC 34.1 04/26/2015 1116   RDW 14.5 04/26/2015 1116   LYMPHSABS 3.0 04/26/2015 1116   MONOABS 0.8 04/26/2015 1116  EOSABS 0.2 04/26/2015 1116   BASOSABS 0.1 04/26/2015 1116      Chemistry      Component Value Date/Time   NA 141 04/26/2015 1116   K 4.0 04/26/2015 1116   CL 105 04/26/2015 1116   CO2 25 04/26/2015 1116   BUN 8 04/26/2015 1116   CREATININE 0.93 04/26/2015 1116   CREATININE 0.90 05/20/2014 1225      Component Value Date/Time   CALCIUM 9.2 04/26/2015 1116   CALCIUM 9.9 02/26/2012 1551   ALKPHOS 45 04/26/2015 1116   AST 19 04/26/2015 1116   ALT 19 04/26/2015 1116   BILITOT 0.5 04/26/2015 1116      RADIOGRAPHIC STUDIES: CLINICAL DATA: Screening.  EXAM: DIGITAL SCREENING UNILATERAL LEFT MAMMOGRAM WITH CAD  COMPARISON: Previous exam(s).  ACR Breast Density Category b: There are scattered areas of fibroglandular density.  FINDINGS: The patient has had a right mastectomy. There are no findings suspicious for malignancy. Images were processed with CAD.  IMPRESSION: No mammographic evidence of malignancy. A result letter of this screening mammogram will be mailed directly to the patient.  RECOMMENDATION: Screening  mammogram in one year. (Code:SM-B-01Y)  BI-RADS CATEGORY 1: Negative.   Electronically Signed  By: Claudie Revering M.D.  On: 12/20/2014 10:29    ASSESSMENT:  1. Right sided breast cancer, ER/PR positive, S/P mastectomy on 10/17/1995, followed by [facility] x 4, followed by Tamoxifen x 5 years, ending as of 04/09/2001. No evidence of recurrence.  2. Obesity.  3. GERD  4. IBS.  5. Right UE lymphedema. 6. History of Pulmonary nodules, last CT scan 02/2014.  7. S/P cystoscopy, left retrograde pyelogram with interpretation, left percutaneous renal access, left nephrolithotomy, left nephrostogram, left ureteral stent, left nephroureteral tube placement on 04/13/2014 by Dr. Louis Meckel   Patient Active Problem List   Diagnosis Date Noted  . Situational anxiety 04/26/2015  . Right knee injury 01/23/2015  . Thyromegaly 01/23/2015  . Rash and nonspecific skin eruption 01/23/2015  . Iron deficiency 06/17/2014  . Nephrolithiasis 04/13/2014  . Kidney stone 03/18/2014  . DDD (degenerative disc disease), lumbar 01/26/2014  . 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 Renal cyst 01/26/2014  . Back pain with left-sided sciatica 12/28/2013  . Type 2 diabetes mellitus ([place]) 08/01/2013  . FATIGUE 12/26/2009  . GASTROPARESIS 12/15/2008  . Esophageal reflux 06/21/2008  . IBS 06/21/2008  . ADENOCARCINOMA, BREAST, RIGHT 02/09/2008  . Hyperlipidemia 02/09/2008  . Obesity 02/09/2008  . Essential hypertension 02/09/2008  . Insomnia 02/09/2008    ASSESSMENT/PLAN:  Right-sided breast cancer measuring 1.4 x 1.2 cm, grade 2, 8 negative axillary lymph nodes for metastatic disease, ER and PR positive. S/P right mastectomy on 10/17/1995, followed by adjuvant chemotherapy consisting of AC x 4 cycles followed by Tamoxifen for 5 years ending as of 04/09/2001.   1. I personally reviewed and went over laboratory results with the patient.  The results are noted within this dictation. Last labs were from  04/26/2015 2. I personally reviewed and went over radiographic studies with the patient.  The results are noted within this dictation.  She is to continue with yearly mammography and breast exam by a physician. 3. Return in 1 year.  All questions were answered. The patient knows to call the clinic with any problems, questions or concerns. We can certainly see the patient much sooner if necessary.   This document serves as a record of services personally performed by Ancil Linsey, MD. It was created on her behalf by Janace Hoard,  a trained medical scribe. The creation of this record is based on the scribe's personal observations and the provider's statements to them. This document has been checked and approved by the attending provider.  I have reviewed the above documentation for accuracy and completeness, and I agree with the above.  This note was electronically signed.  Kelby Fam. Whitney Muse, MD

## 2015-07-28 ENCOUNTER — Ambulatory Visit (INDEPENDENT_AMBULATORY_CARE_PROVIDER_SITE_OTHER): Payer: Medicare Other | Admitting: Family Medicine

## 2015-07-28 ENCOUNTER — Encounter: Payer: Self-pay | Admitting: Family Medicine

## 2015-07-28 VITALS — BP 134/78 | HR 68 | Temp 98.2°F | Resp 16 | Ht 67.0 in | Wt 207.0 lb

## 2015-07-28 DIAGNOSIS — E010 Iodine-deficiency related diffuse (endemic) goiter: Secondary | ICD-10-CM

## 2015-07-28 DIAGNOSIS — E049 Nontoxic goiter, unspecified: Secondary | ICD-10-CM | POA: Diagnosis not present

## 2015-07-28 DIAGNOSIS — E669 Obesity, unspecified: Secondary | ICD-10-CM

## 2015-07-28 DIAGNOSIS — K439 Ventral hernia without obstruction or gangrene: Secondary | ICD-10-CM

## 2015-07-28 DIAGNOSIS — E785 Hyperlipidemia, unspecified: Secondary | ICD-10-CM | POA: Diagnosis not present

## 2015-07-28 DIAGNOSIS — K219 Gastro-esophageal reflux disease without esophagitis: Secondary | ICD-10-CM | POA: Diagnosis not present

## 2015-07-28 DIAGNOSIS — E119 Type 2 diabetes mellitus without complications: Secondary | ICD-10-CM

## 2015-07-28 DIAGNOSIS — I10 Essential (primary) hypertension: Secondary | ICD-10-CM

## 2015-07-28 LAB — COMPREHENSIVE METABOLIC PANEL
ALK PHOS: 55 U/L (ref 33–130)
ALT: 22 U/L (ref 6–29)
AST: 21 U/L (ref 10–35)
Albumin: 3.9 g/dL (ref 3.6–5.1)
BILIRUBIN TOTAL: 0.5 mg/dL (ref 0.2–1.2)
BUN: 15 mg/dL (ref 7–25)
CALCIUM: 9.2 mg/dL (ref 8.6–10.4)
CO2: 25 mmol/L (ref 20–31)
Chloride: 102 mmol/L (ref 98–110)
Creat: 0.88 mg/dL (ref 0.50–0.99)
Glucose, Bld: 252 mg/dL — ABNORMAL HIGH (ref 70–99)
POTASSIUM: 3.9 mmol/L (ref 3.5–5.3)
Sodium: 138 mmol/L (ref 135–146)
TOTAL PROTEIN: 7.4 g/dL (ref 6.1–8.1)

## 2015-07-28 LAB — HEMOGLOBIN A1C
HEMOGLOBIN A1C: 8.2 % — AB (ref <5.7)
Mean Plasma Glucose: 189 mg/dL — ABNORMAL HIGH (ref <117)

## 2015-07-28 MED ORDER — DEXLANSOPRAZOLE 60 MG PO CPDR: 60.0000 mg | DELAYED_RELEASE_CAPSULE | Freq: Every day | ORAL | Status: DC

## 2015-07-28 MED ORDER — METFORMIN HCL ER 500 MG PO TB24: 500.0000 mg | ORAL_TABLET | Freq: Every day | ORAL | Status: DC

## 2015-07-28 NOTE — Assessment & Plan Note (Signed)
Controlled, no changes. 

## 2015-07-28 NOTE — Patient Instructions (Signed)
Try the metformin XL once a day with breakfast STOP the Januvia We will call with results for A1C Hernia referral for consultation F/U 3 months

## 2015-07-28 NOTE — Assessment & Plan Note (Signed)
Uncontrolled, I think  She has has hesitancy about being on medications in general. She wants to try metformin again I agree that her current regimen is probably not the best that she has had reactions  To other medications and we are avoiding injectables. I will try her back on metformin 500 mg extended release along with her glimepiride.  Continue with her nutritionist

## 2015-07-28 NOTE — Assessment & Plan Note (Signed)
2/2 thyroid nodules, had US done in May 2016, TFT normal, did not meet biopsy criteria, recheck May 2017

## 2015-07-28 NOTE — Progress Notes (Signed)
Patient ID: [name], female   DOB: 1946-03-01, 69 y.o.   MRN: [id number]   Subjective:    Patient ID: [name], female    DOB: Feb 13, 1946, 69 y.o.   MRN: [id number]  Patient presents for 3 month F/U  patient here to follow-up medications. She was seen by her oncologist recently as well as the nutritionist. For her diabetes mellitus she would like to try metformin again it did cause of diarrhea but she would like to try the extended release as her blood sugars were best control with this. Her weight is down 4 pounds. She is currently taking Amaryl and Januvia. She denies any hypoglycemia symptoms.    she is history of hernia he seems to be enlarging she will like to have a consultation but is afraid of surgery because of anesthesia complications in the past.   History of acid reflux she's had an EGD done back in 2011 I put her on excellent last year which she did well with but she is just not had any refills she's been taking Zantac. With her hernia her acid reflux tends to get worse.  Meds reviewed,      Review Of Systems:  GEN- denies fatigue, fever, weight loss,weakness, recent illness HEENT- denies eye drainage, change in vision, nasal discharge, CVS- denies chest pain, palpitations RESP- denies SOB, cough, wheeze ABD- denies N/V, change in stools, abd pain GU- denies dysuria, hematuria, dribbling, incontinence MSK- denies joint pain, muscle aches, injury Neuro- denies headache, dizziness, syncope, seizure activity       Objective:    BP 134/78 mmHg  Pulse 68  Temp(Src) 98.2 F (36.8 C) (Oral)  Resp 16  Ht 5\' 7"  (1.702 m)  Wt 207 lb (93.895 kg)  BMI 32.41 kg/m2 GEN- NAD, alert and oriented x3 HEENT- PERRL, EOMI, non injected sclera, pink conjunctiva, MMM, oropharynx clear Neck- Supple, + thyromegaly CVS- RRR, no murmur RESP-CTAB ABD-NABS,soft,NT, Midline ventral hernia- large, easily reduced EXT- No edema Pulses- Radial, DP- 2+        Assessment &  Plan:      Problem List Items Addressed This Visit    Ventral hernia   Type 2 diabetes mellitus (H[facility])    Uncontrolled, I think  She has has hesitancy about being on medications in general. She wants to try metformin again I agree that her current regimen is probably not the best that she has had reactions  To other medications and we are avoiding injectables. I will try her back on metformin 500 mg extended release along with her glimepiride.  Continue with her nutritionist      Relevant Medications   metFORMIN (GLUCOPHAGE XR) 500 MG 24 hr tablet   Other Relevant Orders   Hemoglobin A1c   Thyromegaly    2/2 thyroid nodules, had US done in May 2016, TFT normal, did not meet biopsy criteria, recheck May 2017      Obesity   Relevant Medications   metFORMIN (GLUCOPHAGE XR) 500 MG 24 hr tablet   Hyperlipidemia   Essential hypertension - Primary    Controlled, no changes      Relevant Orders   Comprehensive metabolic panel   Esophageal reflux    Restart dexilant      Relevant Medications   dexlansoprazole (DEXILANT) 60 MG capsule      Note: This dictation was prepared with Dragon dictation along with smaller phrase technology. Any transcriptional errors that result from this process are unintentional.

## 2015-07-28 NOTE — Assessment & Plan Note (Signed)
Restart dexilant. 

## 2015-07-31 ENCOUNTER — Ambulatory Visit: Payer: Medicare Other | Admitting: Dietician

## 2015-08-07 ENCOUNTER — Encounter: Payer: Self-pay | Admitting: Internal Medicine

## 2015-08-11 IMAGING — DX DG KNEE COMPLETE 4+V*R*
4 series · 4 of 4 positions shown · non-contrast
Comparison: None.

CLINICAL DATA: 68-year-old female with right knee pain and swelling
after a trip and fall three weeks ago at home. Initial encounter.

EXAM:
RIGHT KNEE - COMPLETE 4+ VIEW

[knee ap]
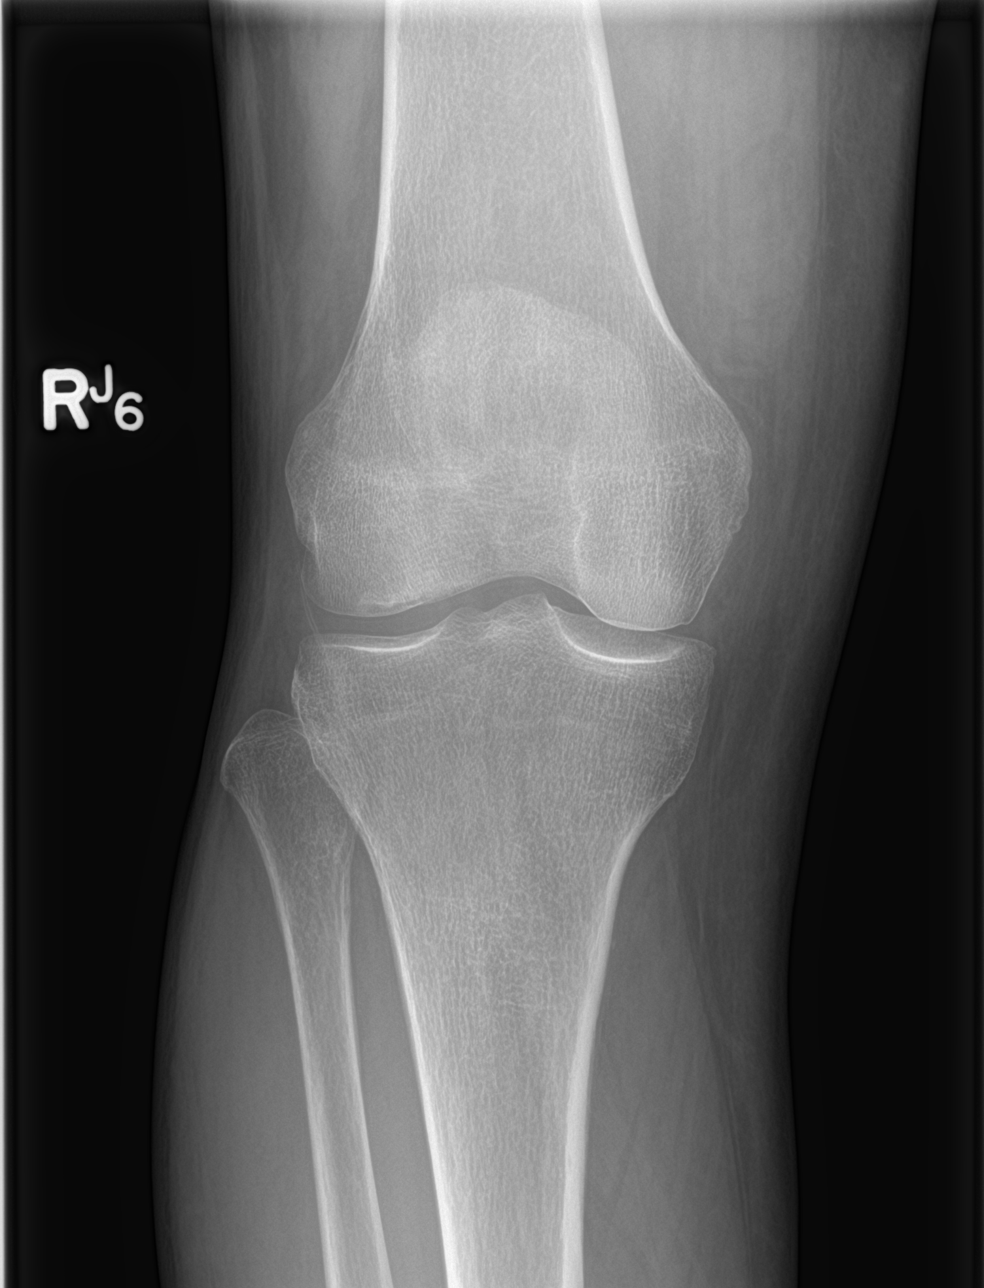

[knee obl (1 of 2)]
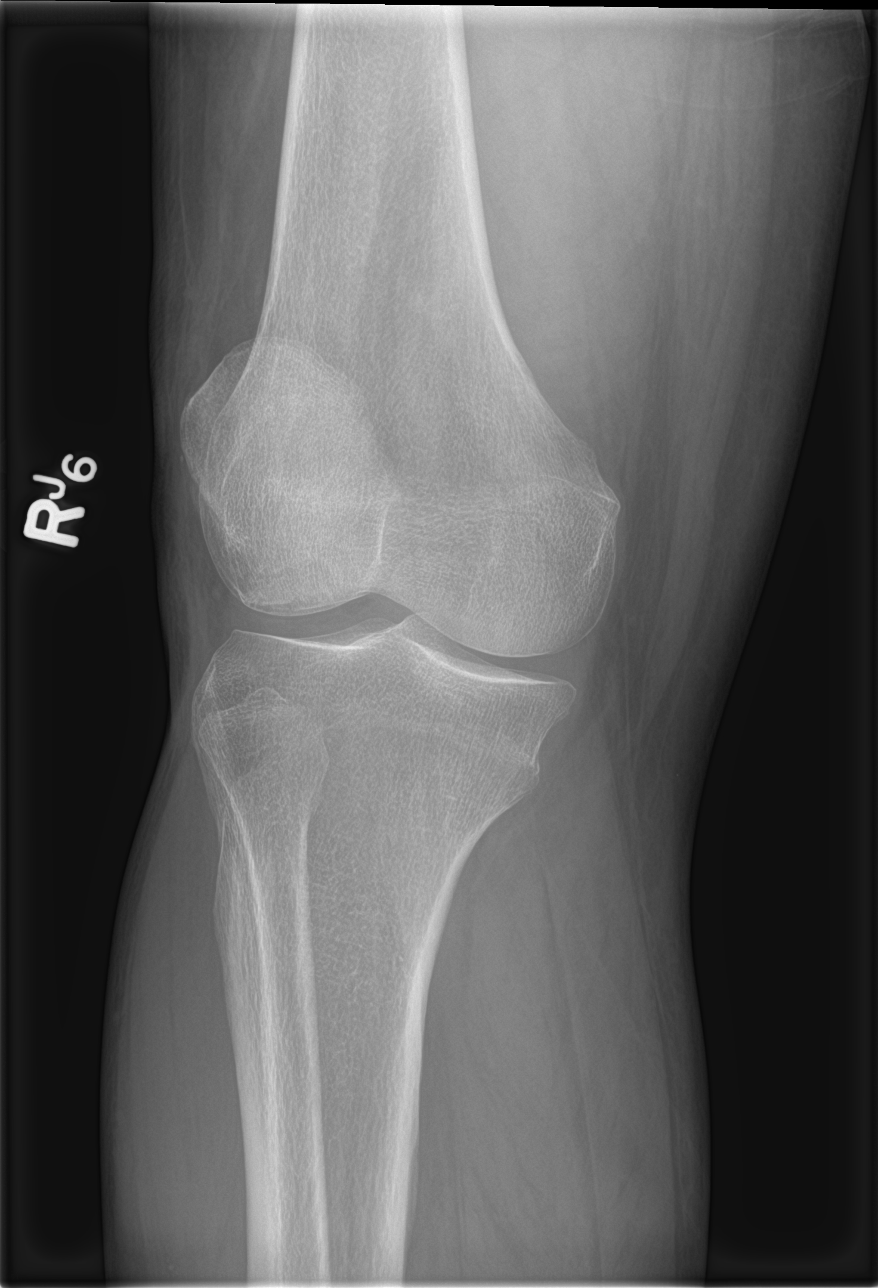

[knee obl (2 of 2)]
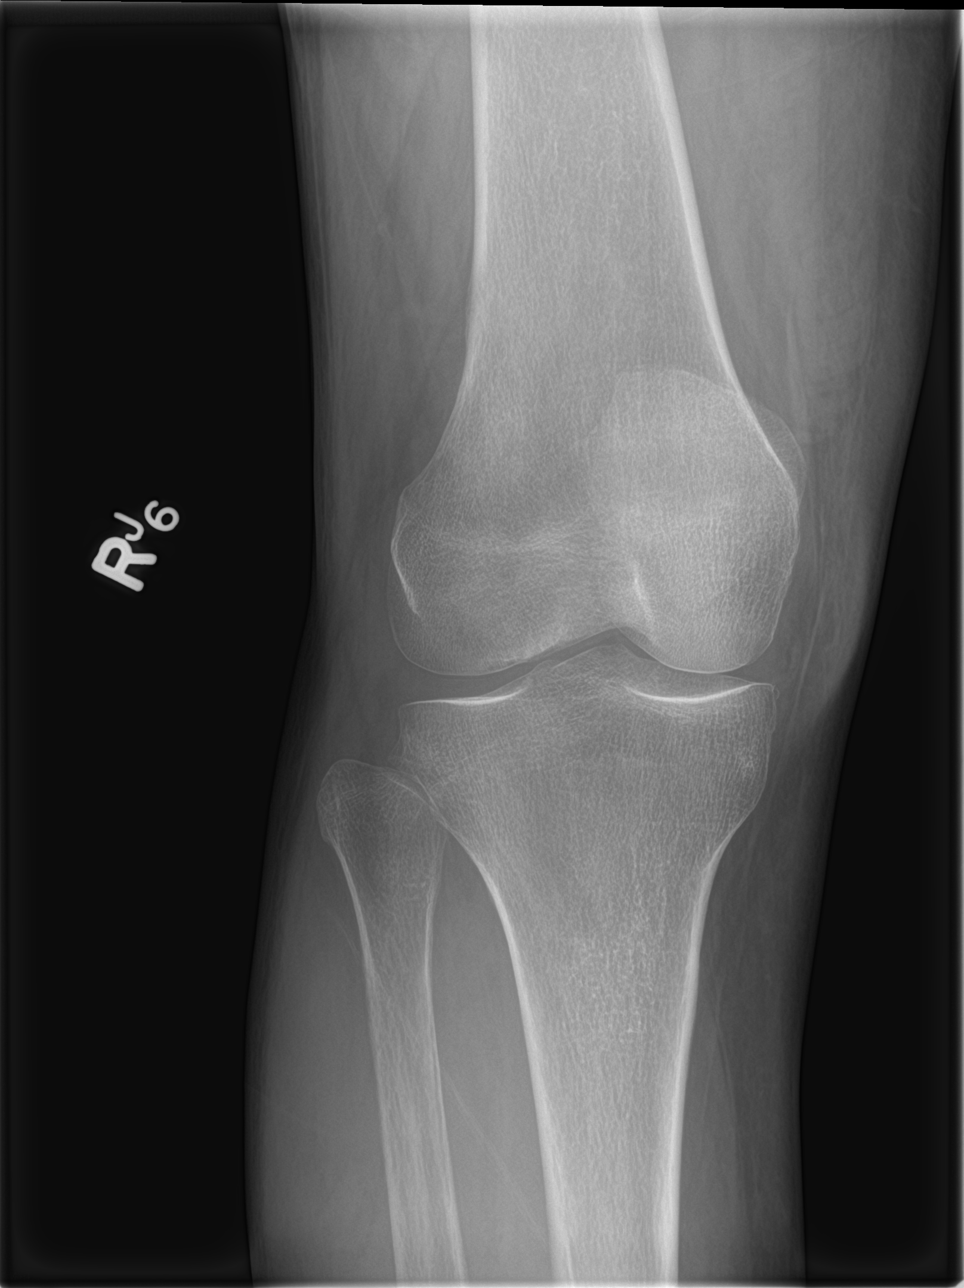

[knee lat]
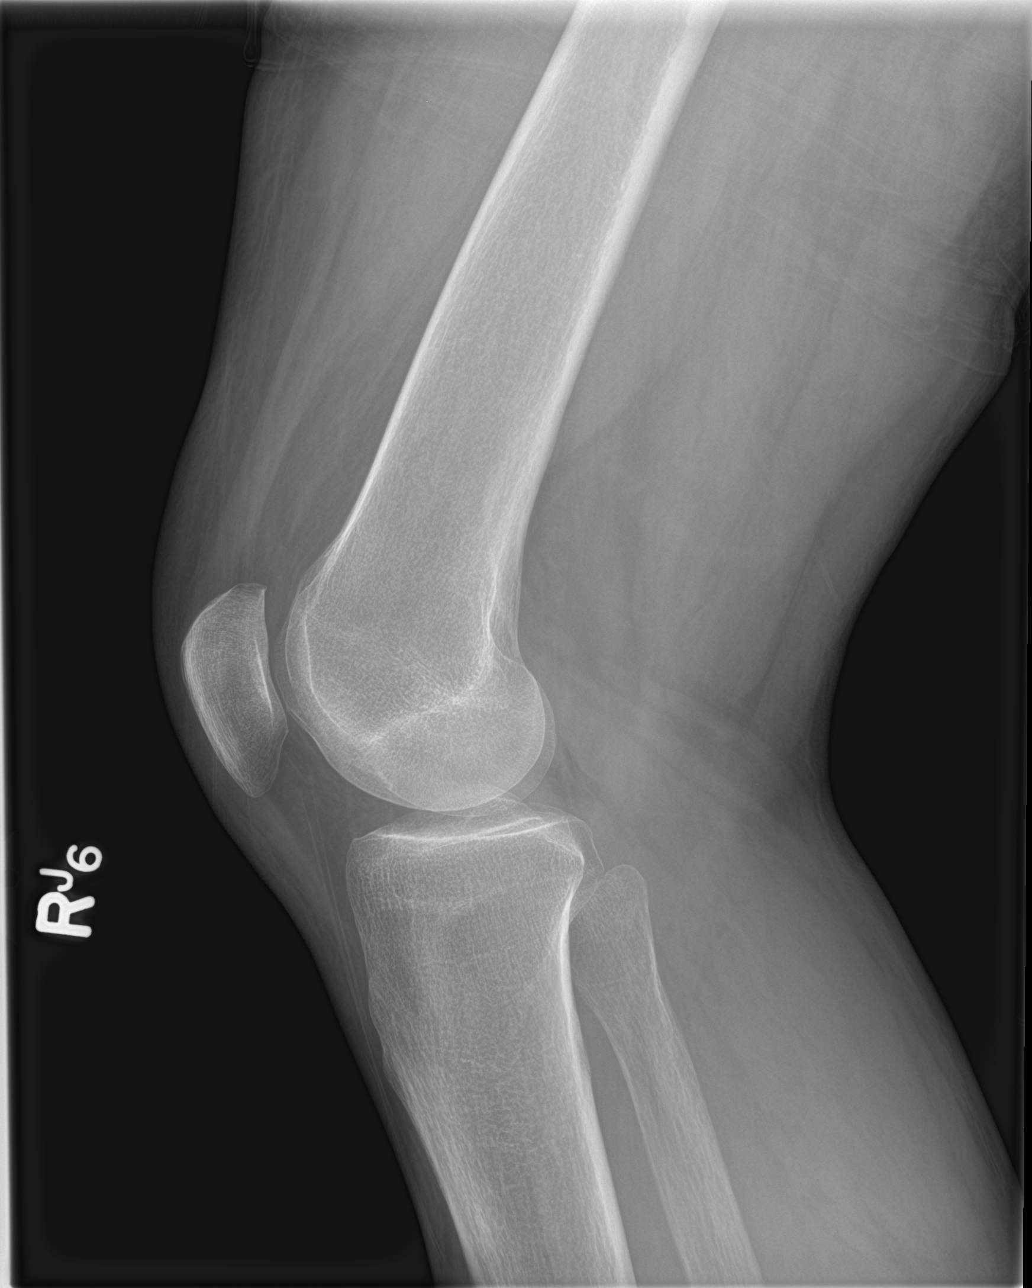

[4 of 4 positions shown; findings below may reference images not displayed]

FINDINGS: Bone mineralization is within normal limits for age. No joint
effusion is evident. Joint spaces and alignment within normal
limits. Patella intact.
IMPRESSION: Negative for age radiographic appearance of the right knee.

## 2015-09-04 ENCOUNTER — Ambulatory Visit: Payer: Medicare Other | Admitting: Dietician

## 2015-10-12 ENCOUNTER — Other Ambulatory Visit: Payer: Self-pay | Admitting: Family Medicine

## 2015-10-12 NOTE — Telephone Encounter (Signed)
Refill appropriate and filled per protocol. 

## 2015-10-25 ENCOUNTER — Other Ambulatory Visit: Payer: Self-pay | Admitting: Family Medicine

## 2015-10-25 NOTE — Telephone Encounter (Signed)
Medication refilled per protocol. 

## 2015-10-30 ENCOUNTER — Encounter: Payer: Self-pay | Admitting: Family Medicine

## 2015-10-30 ENCOUNTER — Ambulatory Visit (INDEPENDENT_AMBULATORY_CARE_PROVIDER_SITE_OTHER): Payer: Medicare Other | Admitting: Family Medicine

## 2015-10-30 VITALS — BP 136/80 | HR 82 | Temp 98.3°F | Resp 14 | Ht 67.0 in | Wt 205.0 lb

## 2015-10-30 DIAGNOSIS — M5412 Radiculopathy, cervical region: Secondary | ICD-10-CM

## 2015-10-30 DIAGNOSIS — I10 Essential (primary) hypertension: Secondary | ICD-10-CM

## 2015-10-30 DIAGNOSIS — M5030 Other cervical disc degeneration, unspecified cervical region: Secondary | ICD-10-CM

## 2015-10-30 DIAGNOSIS — E119 Type 2 diabetes mellitus without complications: Secondary | ICD-10-CM

## 2015-10-30 DIAGNOSIS — E785 Hyperlipidemia, unspecified: Secondary | ICD-10-CM | POA: Diagnosis not present

## 2015-10-30 LAB — CBC WITH DIFFERENTIAL/PLATELET
BASOS ABS: 0.1 10*3/uL (ref 0.0–0.1)
BASOS PCT: 1 % (ref 0–1)
EOS ABS: 0.3 10*3/uL (ref 0.0–0.7)
Eosinophils Relative: 3 % (ref 0–5)
HCT: 37.2 % (ref 36.0–46.0)
Hemoglobin: 12.2 g/dL (ref 12.0–15.0)
Lymphocytes Relative: 34 % (ref 12–46)
Lymphs Abs: 3 10*3/uL (ref 0.7–4.0)
MCH: 27 pg (ref 26.0–34.0)
MCHC: 32.8 g/dL (ref 30.0–36.0)
MCV: 82.3 fL (ref 78.0–100.0)
MONOS PCT: 8 % (ref 3–12)
MPV: 10.3 fL (ref 8.6–12.4)
Monocytes Absolute: 0.7 10*3/uL (ref 0.1–1.0)
NEUTROS PCT: 54 % (ref 43–77)
Neutro Abs: 4.7 10*3/uL (ref 1.7–7.7)
PLATELETS: 245 10*3/uL (ref 150–400)
RBC: 4.52 MIL/uL (ref 3.87–5.11)
RDW: 14.6 % (ref 11.5–15.5)
WBC: 8.7 10*3/uL (ref 4.0–10.5)

## 2015-10-30 LAB — COMPREHENSIVE METABOLIC PANEL
ALT: 20 U/L (ref 6–29)
AST: 18 U/L (ref 10–35)
Albumin: 3.9 g/dL (ref 3.6–5.1)
Alkaline Phosphatase: 54 U/L (ref 33–130)
BUN: 12 mg/dL (ref 7–25)
CHLORIDE: 102 mmol/L (ref 98–110)
CO2: 27 mmol/L (ref 20–31)
CREATININE: 0.95 mg/dL (ref 0.50–0.99)
Calcium: 9.3 mg/dL (ref 8.6–10.4)
GLUCOSE: 232 mg/dL — AB (ref 70–99)
Potassium: 4.5 mmol/L (ref 3.5–5.3)
SODIUM: 140 mmol/L (ref 135–146)
TOTAL PROTEIN: 7.3 g/dL (ref 6.1–8.1)
Total Bilirubin: 0.6 mg/dL (ref 0.2–1.2)

## 2015-10-30 LAB — LIPID PANEL
CHOLESTEROL: 202 mg/dL — AB (ref 125–200)
HDL: 53 mg/dL (ref >=46)
LDL Cholesterol: 121 mg/dL (ref <130)
TRIGLYCERIDES: 140 mg/dL (ref <150)
Total CHOL/HDL Ratio: 3.8 Ratio (ref <=5.0)
VLDL: 28 mg/dL (ref <30)

## 2015-10-30 LAB — HEMOGLOBIN A1C
Hgb A1c MFr Bld: 8.4 % — ABNORMAL HIGH (ref <5.7)
MEAN PLASMA GLUCOSE: 194 mg/dL — AB (ref <117)

## 2015-10-30 NOTE — Patient Instructions (Addendum)
MRI of neck to be done  We will call with  Lab results  F/U 3 months

## 2015-10-31 ENCOUNTER — Encounter: Payer: Self-pay | Admitting: Family Medicine

## 2015-10-31 NOTE — Progress Notes (Signed)
Patient ID: [name], female   DOB: 09/29/1945, 70 y.o.   MRN: [id number]    Subjective:    Patient ID: [name], female    DOB: 01-22-1946, 70 y.o.   MRN: [id number]  Patient presents for 3 month F/U  is here for follow-up. Diabetes mellitus her last A1c was 8.2% she states that her blood sugars have been ranging 160-170 fasting she had not watch her diet during the holidays. She is not followed up with the nutritionist because of the holidays. She denies any hypoglycemia. She is taking all medicines as prescribed.  Complains of right arm pain feels like her arm is feeling numb she will get shooting pains that go up from the shoulder down to her thumb. Her thumb and index finger 10 to always be numb and have a different sensation. She feels weak on that side. This is been progressive over the past few months. She thought it would go away on its own. She states she cannot lift and pull as she previously did she feels like her arm is strong enough.    Review Of Systems:  GEN- denies fatigue, fever, weight loss,weakness, recent illness HEENT- denies eye drainage, change in vision, nasal discharge, CVS- denies chest pain, palpitations RESP- denies SOB, cough, wheeze ABD- denies N/V, change in stools, abd pain GU- denies dysuria, hematuria, dribbling, incontinence MSK- + joint pain, muscle aches, injury Neuro- denies headache, dizziness, syncope, seizure activity       Objective:    BP 136/80 mmHg  Pulse 82  Temp(Src) 98.3 F (36.8 C) (Oral)  Resp 14  Ht 5\' 7"  (1.702 m)  Wt 205 lb (92.987 kg)  BMI 32.10 kg/m2 GEN- NAD, alert and oriented x3 HEENT- PERRL, EOMI, non injected sclera, pink conjunctiva, MMM, oropharynx clear Neck- Supple, + thyromegaly, fair ROM, neg spurlings  CVS- RRR, no murmur RESP-CTAB MSK- fair ROM upper ext, decreased strenth RUE compared to left, rotator cuff intact Neuro-CNII-XII intact, decreased sensation thumb and 1st digit, neg tinels  EXT-  No edema Pulses- Radial, DP- 2+        Assessment & Plan:      Problem List Items Addressed This Visit    Type 2 diabetes mellitus ([place])    Diabetes is uncontrolled. We'll recheck her A1c today and hopefully we have some improvement. She is taking meds as prescribed Also follow-up with the diabetic nutritionist      Relevant Orders   CBC with Differential/Platelet (Completed)   Comprehensive metabolic panel (Completed)   Hemoglobin A1c (Completed)   Hyperlipidemia   Relevant Orders   Lipid panel (Completed)   Essential hypertension - Primary    Blood pressure control and change her medication      Relevant Orders   CBC with Differential/Platelet (Completed)   Comprehensive metabolic panel (Completed)    Other Visit Diagnoses    DDD (degenerative disc disease), cervical        She has degenerative changes in the neck but now with progressive radiculopathy down the right arm into the fingertips. She has some weakness on the right side. I'm going to obtain MRI of C-spine     Cervical radiculitis           Note: This dictation was prepared with Dragon dictation along with smaller phrase technology. Any transcriptional errors that result from this process are unintentional.

## 2015-10-31 NOTE — Assessment & Plan Note (Signed)
Blood pressure control and change her medication

## 2015-10-31 NOTE — Assessment & Plan Note (Signed)
Diabetes is uncontrolled. We'll recheck her A1c today and hopefully we have some improvement. She is taking meds as prescribed Also follow-up with the diabetic nutritionist

## 2015-11-01 ENCOUNTER — Telehealth: Payer: Self-pay | Admitting: *Deleted

## 2015-11-01 NOTE — Telephone Encounter (Signed)
Pt has appt scheduled for Feb 21 at 10am for arrival at 9:45am at [place], [facility] to pt for appt information

## 2015-11-02 ENCOUNTER — Other Ambulatory Visit: Payer: Self-pay | Admitting: *Deleted

## 2015-11-02 MED ORDER — GLIMEPIRIDE 2 MG PO TABS: ORAL_TABLET | ORAL | Status: DC

## 2015-11-02 NOTE — Telephone Encounter (Signed)
Pt aware of appt.

## 2015-11-06 ENCOUNTER — Other Ambulatory Visit: Payer: Self-pay | Admitting: Family Medicine

## 2015-11-07 NOTE — Telephone Encounter (Signed)
Refill appropriate and filled per protocol. 

## 2015-11-14 ENCOUNTER — Ambulatory Visit (HOSPITAL_COMMUNITY): Payer: Medicare Other

## 2015-11-23 ENCOUNTER — Ambulatory Visit (HOSPITAL_COMMUNITY)
Admission: RE | Admit: 2015-11-23 | Discharge: 2015-11-23 | Disposition: A | Discharge status: Home / Self Care | Payer: Medicare Other | Source: Ambulatory Visit | Attending: Family Medicine | Admitting: Family Medicine

## 2015-11-23 DIAGNOSIS — M5021 Other cervical disc displacement,  high cervical region: Secondary | ICD-10-CM | POA: Insufficient documentation

## 2015-11-23 DIAGNOSIS — M4802 Spinal stenosis, cervical region: Secondary | ICD-10-CM | POA: Insufficient documentation

## 2015-11-23 DIAGNOSIS — M2578 Osteophyte, vertebrae: Secondary | ICD-10-CM | POA: Diagnosis not present

## 2015-11-23 DIAGNOSIS — M8938 Hypertrophy of bone, other site: Secondary | ICD-10-CM | POA: Diagnosis not present

## 2015-11-23 DIAGNOSIS — M5412 Radiculopathy, cervical region: Secondary | ICD-10-CM | POA: Diagnosis present

## 2015-11-23 DIAGNOSIS — M50222 Other cervical disc displacement at C5-C6 level: Secondary | ICD-10-CM | POA: Insufficient documentation

## 2015-11-23 DIAGNOSIS — M5030 Other cervical disc degeneration, unspecified cervical region: Secondary | ICD-10-CM | POA: Diagnosis not present

## 2015-11-23 DIAGNOSIS — M50223 Other cervical disc displacement at C6-C7 level: Secondary | ICD-10-CM | POA: Diagnosis not present

## 2015-11-24 ENCOUNTER — Other Ambulatory Visit: Payer: Self-pay | Admitting: Family Medicine

## 2015-11-24 DIAGNOSIS — G9520 Unspecified cord compression: Secondary | ICD-10-CM

## 2015-11-24 DIAGNOSIS — M5010 Cervical disc disorder with radiculopathy, unspecified cervical region: Secondary | ICD-10-CM

## 2015-11-27 NOTE — Progress Notes (Signed)
Referral placed to [facility] neurospine as URGENT

## 2015-12-06 ENCOUNTER — Other Ambulatory Visit: Payer: Self-pay | Admitting: Family Medicine

## 2015-12-06 NOTE — Telephone Encounter (Signed)
Medication refilled per protocol. 

## 2016-01-25 ENCOUNTER — Other Ambulatory Visit: Payer: Self-pay | Admitting: Family Medicine

## 2016-01-26 NOTE — Telephone Encounter (Signed)
Refill appropriate and filled per protocol. 

## 2016-01-29 ENCOUNTER — Encounter: Payer: Self-pay | Admitting: Family Medicine

## 2016-01-29 ENCOUNTER — Ambulatory Visit (INDEPENDENT_AMBULATORY_CARE_PROVIDER_SITE_OTHER): Payer: Medicare Other | Admitting: Family Medicine

## 2016-01-29 VITALS — BP 132/76 | HR 82 | Temp 98.2°F | Resp 14 | Ht 67.0 in | Wt 208.0 lb

## 2016-01-29 DIAGNOSIS — E119 Type 2 diabetes mellitus without complications: Secondary | ICD-10-CM | POA: Diagnosis not present

## 2016-01-29 DIAGNOSIS — E669 Obesity, unspecified: Secondary | ICD-10-CM

## 2016-01-29 DIAGNOSIS — I10 Essential (primary) hypertension: Secondary | ICD-10-CM | POA: Diagnosis not present

## 2016-01-29 DIAGNOSIS — E785 Hyperlipidemia, unspecified: Secondary | ICD-10-CM | POA: Diagnosis not present

## 2016-01-29 LAB — HEMOGLOBIN A1C
Hgb A1c MFr Bld: 9.8 % — ABNORMAL HIGH
Mean Plasma Glucose: 235 mg/dL

## 2016-01-29 LAB — CBC WITH DIFFERENTIAL/PLATELET
Basophils Absolute: 0 {cells}/uL (ref 0–200)
Basophils Relative: 0 %
Eosinophils Absolute: 222 {cells}/uL (ref 15–500)
Eosinophils Relative: 3 %
HCT: 36.5 % (ref 35.0–45.0)
Hemoglobin: 11.7 g/dL — ABNORMAL LOW (ref 12.0–15.0)
Lymphocytes Relative: 36 %
Lymphs Abs: 2664 {cells}/uL (ref 850–3900)
MCH: 26.2 pg — ABNORMAL LOW (ref 27.0–33.0)
MCHC: 32.1 g/dL (ref 32.0–36.0)
MCV: 81.8 fL (ref 80.0–100.0)
MPV: 10.6 fL (ref 7.5–12.5)
Monocytes Absolute: 666 {cells}/uL (ref 200–950)
Monocytes Relative: 9 %
Neutro Abs: 3848 {cells}/uL (ref 1500–7800)
Neutrophils Relative %: 52 %
Platelets: 236 10*3/uL (ref 140–400)
RBC: 4.46 MIL/uL (ref 3.80–5.10)
RDW: 14.7 % (ref 11.0–15.0)
WBC: 7.4 10*3/uL (ref 3.8–10.8)

## 2016-01-29 LAB — COMPREHENSIVE METABOLIC PANEL
ALT: 21 U/L (ref 6–29)
AST: 22 U/L (ref 10–35)
Albumin: 3.8 g/dL (ref 3.6–5.1)
Alkaline Phosphatase: 45 U/L (ref 33–130)
BILIRUBIN TOTAL: 0.5 mg/dL (ref 0.2–1.2)
BUN: 9 mg/dL (ref 7–25)
CALCIUM: 8.9 mg/dL (ref 8.6–10.4)
CO2: 26 mmol/L (ref 20–31)
Chloride: 102 mmol/L (ref 98–110)
Creat: 0.85 mg/dL (ref 0.50–0.99)
GLUCOSE: 313 mg/dL — AB (ref 70–99)
POTASSIUM: 4.6 mmol/L (ref 3.5–5.3)
Sodium: 139 mmol/L (ref 135–146)
Total Protein: 7 g/dL (ref 6.1–8.1)

## 2016-01-29 NOTE — Patient Instructions (Signed)
Referral to nutritionist  F/U 3 months

## 2016-01-29 NOTE — Progress Notes (Signed)
Patient ID: [name], female   DOB: 01/25/1946, 70 y.o.   MRN: [id number]    Subjective:    Patient ID: [name], female    DOB: [birth year], 70 y.o.   MRN: [id number]  Patient presents for 3 month F/U Patient here to follow-up chronic medical problems. Her last visit she was referred to neurosurgery secondary to MRI showing very large cervical disc protrusions with cord compression from C3-C4 and C6-C7 she been having some radicular symptoms with numbness down the right extremity. She states symptoms come and go, she had initial visit they discussed epidurals she declined at this time  Diabetes mellitus her last check her A1c was 8.4% her Amaryl was increased to 4 mg in the morning 2 mg in the evening she is also on metformin and Januvia LDL is not at goal but does not tolerate statins  She has not been watching her blood sugar or her diet, has gained another 3 pounds       Review Of Systems:  GEN- denies fatigue, fever, weight loss,weakness, recent illness HEENT- denies eye drainage, change in vision, nasal discharge, CVS- denies chest pain, palpitations RESP- denies SOB, cough, wheeze ABD- denies N/V, change in stools, abd pain GU- denies dysuria, hematuria, dribbling, incontinence MSK- denies joint pain, muscle aches, injury Neuro- denies headache, dizziness, syncope, seizure activity       Objective:    BP 132/76 mmHg  Pulse 82  Temp(Src) 98.2 F (36.8 C) (Oral)  Resp 14  Ht 5\' 7"  (1.702 m)  Wt 208 lb (94.348 kg)  BMI 32.57 kg/m2 GEN- NAD, alert and oriented x3 HEENT- PERRL, EOMI, non injected sclera, pink conjunctiva, MMM, oropharynx clear CVS- RRR, no murmur RESP-CTAB EXT- No edema Pulses- Radial, DP- 2+        Assessment & Plan:      Problem List Items Addressed This Visit    None      Note: This dictation was prepared with Dragon dictation along with smaller phrase technology. Any transcriptional errors that result from this process are  unintentional.

## 2016-01-30 LAB — MICROALBUMIN / CREATININE URINE RATIO
Creatinine, Urine: 244 mg/dL (ref 20–320)
MICROALB UR: 11.3 mg/dL
MICROALB/CREAT RATIO: 46 ug/mg{creat} — AB (ref <30)

## 2016-01-30 NOTE — Assessment & Plan Note (Signed)
Diabetes uncontrolled, I think she will need injectable medication which she declines Will refer to nutritionist again, she agrees to this [place] labs today

## 2016-01-30 NOTE — Assessment & Plan Note (Signed)
Controlled, no change to meds 

## 2016-01-31 ENCOUNTER — Other Ambulatory Visit: Payer: Self-pay | Admitting: *Deleted

## 2016-01-31 MED ORDER — METFORMIN HCL ER (MOD) 1000 MG PO TB24: 1000.0000 mg | ORAL_TABLET | Freq: Every day | ORAL | Status: DC

## 2016-02-01 ENCOUNTER — Other Ambulatory Visit: Payer: Self-pay | Admitting: *Deleted

## 2016-02-01 MED ORDER — METFORMIN HCL ER (OSM) 1000 MG PO TB24: 1000.0000 mg | ORAL_TABLET | Freq: Every day | ORAL | Status: DC

## 2016-02-01 MED ORDER — METFORMIN HCL ER 500 MG PO TB24: 1000.0000 mg | ORAL_TABLET | Freq: Every day | ORAL | Status: DC

## 2016-03-06 ENCOUNTER — Encounter: Payer: Medicare Other | Attending: Family Medicine | Admitting: *Deleted

## 2016-03-06 ENCOUNTER — Encounter: Payer: Self-pay | Admitting: *Deleted

## 2016-03-06 VITALS — BP 140/88 | Ht 66.0 in | Wt 205.9 lb

## 2016-03-06 DIAGNOSIS — E119 Type 2 diabetes mellitus without complications: Secondary | ICD-10-CM

## 2016-03-06 NOTE — Patient Instructions (Addendum)
Check blood sugars 2 x day before breakfast and 2 hrs after supper every day Exercise:  Continue  for   15-30  minutes   1 day a week and gradually increase to 150 minutes/week Eat 3 meals day, 2-3  snacks a day Space meals 4-6 hours apart Allow 2-3 hours between meals and snacks Limit desserts and sweets Avoid sugar sweetened drinks (soda, coffee, juices)  Bring blood sugar records to the next class

## 2016-03-06 NOTE — Progress Notes (Signed)
Diabetes Self-Management Education  Visit Type: First/Initial  Appt. Start Time: 1345 Appt. End Time: [id number]  03/06/2016  [name], identified by name and date of birth, is a 70 y.o. female with a diagnosis of Diabetes: Type 2.   ASSESSMENT  Blood pressure 140/88, height 5\' 6"  (1.676 m), weight 205 lb 14.4 oz (93.396 kg). Body mass index is 33.25 kg/(m^2).      Diabetes Self-Management Education - 03/06/16 1650    Visit Information   Visit Type First/Initial   Initial Visit   Diabetes Type Type 2   Are you currently following a meal plan? No   Are you taking your medications as prescribed? Yes   Date Diagnosed [place]   How would you rate your overall health? Fair   Psychosocial Assessment   Patient Belief/Attitude about Diabetes Other (comment)  "emotionally bonded"   Self-care barriers None   Self-management support Doctor's office;Family   Patient Concerns Nutrition/Meal planning;Weight Control;Medication;Glycemic Control;Healthy Lifestyle;Monitoring;Problem Solving   Special Needs None   Preferred Learning Style Hands on   Learning Readiness Contemplating  "I don't know"   How often do you need to have someone help you when you read instructions, pamphlets, or other written materials from your doctor or pharmacy? 1 - Never   What is the last grade level you completed in school? college   Pre-Education Assessment   Patient understands the diabetes disease and treatment process. Needs Instruction   Patient understands incorporating nutritional management into lifestyle. Needs Instruction   Patient undertands incorporating physical activity into lifestyle. Needs Instruction   Patient understands using medications safely. Needs Instruction   Patient understands monitoring blood glucose, interpreting and using results Needs Review   Patient understands prevention, detection, and treatment of acute complications. Needs Instruction   Patient understands  prevention, detection, and treatment of chronic complications. Needs Review   Patient understands how to develop strategies to address psychosocial issues. Needs Instruction   Patient understands how to develop strategies to promote health/change behavior. Needs Instruction   Complications   Last HgB A1C per patient/outside source 9.8 %  01/29/16   How often do you check your blood sugar? 0 times/day (not testing)  Pt has a meter but hasn't checked since April.   Have you had a dilated eye exam in the past 12 months? Yes   Have you had a dental exam in the past 12 months? Yes   Are you checking your feet? Yes   How many days per week are you checking your feet? 7   Dietary Intake   Breakfast applesauce to take with her pills   Snack (morning) 3+ snacks - mostly sweets and desserts   Lunch chicken or leftovers   Dinner chicken and vegetables; salad   Beverage(s) fruit juice, water, regular Coke - at least 1 per day; unsweetened tea; coffee with sugar   Exercise   Exercise Type Light (walking / raking leaves)   How many days per week to you exercise? 1   How many minutes per day do you exercise? 20   Total minutes per week of exercise 20   Patient Education   Previous Diabetes Education Yes (please comment)  "years ago"   Disease state  Definition of diabetes, type 1 and 2, and the diagnosis of diabetes   Nutrition management  Role of diet in the treatment of diabetes and the relationship between the three main macronutrients and blood glucose level   Physical activity and  exercise  Role of exercise on diabetes management, blood pressure control and cardiac health.   Medications Reviewed patients medication for diabetes, action, purpose, timing of dose and side effects.   Monitoring Purpose and frequency of SMBG.;Identified appropriate SMBG and/or A1C goals.;Yearly dilated eye exam   Chronic complications Relationship between chronic complications and blood glucose control   Psychosocial  adjustment Identified and addressed patients feelings and concerns about diabetes   Individualized Goals (developed by patient)   Reducing Risk Improve blood sugars Decrease medications Prevent diabetes complications Lose weight Lead a healthier lifestyle Become more fit   Outcomes   Expected Outcomes Demonstrated interest in learning. Expect positive outcomes   Future DMSE 4-6 wks      Individualized Plan for Diabetes Self-Management Training:   Learning Objective:  Patient will have a greater understanding of diabetes self-management. Patient education plan is to attend individual and/or group sessions per assessed needs and concerns.   Plan:   Patient Instructions  Check blood sugars 2 x day before breakfast and 2 hrs after supper every day Exercise:  Continue  for   15-30  minutes   1 day a week and gradually increase to 150 minutes/week Eat 3 meals day, 2-3  snacks a day Space meals 4-6 hours apart Allow 2-3 hours between meals and snacks Limit desserts and sweets Avoid sugar sweetened drinks (soda, coffee, juices)  Bring blood sugar records to the next class   Expected Outcomes:  Demonstrated interest in learning. Expect positive outcomes  Education material provided:  General Meal Planning Guidelines Simple Meal Plan  If problems or questions, patient to contact team via:  Johny Drilling, [place], [place], CDE [phone]  Future DSME appointment: 4-6 wks  April 22, 2016 for Diabetes Class 1

## 2016-03-09 ENCOUNTER — Other Ambulatory Visit: Payer: Self-pay | Admitting: Family Medicine

## 2016-03-12 NOTE — Telephone Encounter (Signed)
Refill appropriate and filled per protocol. 

## 2016-04-03 ENCOUNTER — Other Ambulatory Visit: Payer: Self-pay | Admitting: Family Medicine

## 2016-04-03 NOTE — Telephone Encounter (Signed)
Refill appropriate and filled per protocol. 

## 2016-04-05 ENCOUNTER — Other Ambulatory Visit: Payer: Self-pay | Admitting: Family Medicine

## 2016-04-05 NOTE — Telephone Encounter (Signed)
Refill appropriate and filled per protocol. 

## 2016-04-22 ENCOUNTER — Encounter: Payer: Medicare Other | Attending: Family Medicine | Admitting: Dietician

## 2016-04-22 ENCOUNTER — Encounter: Payer: Self-pay | Admitting: Dietician

## 2016-04-22 VITALS — Ht 66.0 in | Wt 205.1 lb

## 2016-04-22 DIAGNOSIS — E119 Type 2 diabetes mellitus without complications: Secondary | ICD-10-CM | POA: Diagnosis not present

## 2016-04-22 NOTE — Progress Notes (Signed)

## 2016-04-24 ENCOUNTER — Other Ambulatory Visit: Payer: Self-pay | Admitting: Family Medicine

## 2016-04-24 NOTE — Telephone Encounter (Signed)
Refill appropriate and filled per protocol. 

## 2016-04-29 ENCOUNTER — Encounter: Payer: Self-pay | Admitting: Dietician

## 2016-04-29 ENCOUNTER — Encounter: Payer: Medicare Other | Attending: Family Medicine | Admitting: Dietician

## 2016-04-29 DIAGNOSIS — E119 Type 2 diabetes mellitus without complications: Secondary | ICD-10-CM | POA: Diagnosis not present

## 2016-04-29 NOTE — Progress Notes (Signed)

## 2016-05-06 ENCOUNTER — Other Ambulatory Visit: Payer: Self-pay | Admitting: Family Medicine

## 2016-05-06 ENCOUNTER — Encounter: Payer: Self-pay | Admitting: Dietician

## 2016-05-06 ENCOUNTER — Encounter: Payer: Medicare Other | Admitting: Dietician

## 2016-05-06 VITALS — BP 140/70 | Ht 66.0 in | Wt 204.3 lb

## 2016-05-06 DIAGNOSIS — E119 Type 2 diabetes mellitus without complications: Secondary | ICD-10-CM

## 2016-05-06 NOTE — Progress Notes (Signed)

## 2016-05-07 NOTE — Telephone Encounter (Signed)
Refill appropriate and filled per protocol. 

## 2016-05-14 ENCOUNTER — Encounter: Payer: Self-pay | Admitting: *Deleted

## 2016-05-20 LAB — HM DIABETES EYE EXAM

## 2016-05-30 ENCOUNTER — Other Ambulatory Visit: Payer: Self-pay | Admitting: Family Medicine

## 2016-06-04 ENCOUNTER — Other Ambulatory Visit: Payer: Self-pay | Admitting: Family Medicine

## 2016-06-04 NOTE — Telephone Encounter (Signed)
Medication refill for one time only.  Patient needs to be seen.  Letter sent for patient to call and schedule 

## 2016-06-29 ENCOUNTER — Other Ambulatory Visit: Payer: Self-pay | Admitting: Family Medicine

## 2016-07-02 ENCOUNTER — Other Ambulatory Visit: Payer: Self-pay | Admitting: Family Medicine

## 2016-07-02 NOTE — Telephone Encounter (Signed)
Medication refilled per protocol. 

## 2016-07-03 ENCOUNTER — Ambulatory Visit (INDEPENDENT_AMBULATORY_CARE_PROVIDER_SITE_OTHER): Payer: Medicare Other | Admitting: Family Medicine

## 2016-07-03 ENCOUNTER — Encounter: Payer: Self-pay | Admitting: Family Medicine

## 2016-07-03 VITALS — BP 132/78 | HR 78 | Temp 98.2°F | Resp 16 | Ht 66.0 in | Wt 203.0 lb

## 2016-07-03 DIAGNOSIS — E010 Iodine-deficiency related diffuse (endemic) goiter: Secondary | ICD-10-CM | POA: Diagnosis not present

## 2016-07-03 DIAGNOSIS — I10 Essential (primary) hypertension: Secondary | ICD-10-CM | POA: Diagnosis not present

## 2016-07-03 DIAGNOSIS — K219 Gastro-esophageal reflux disease without esophagitis: Secondary | ICD-10-CM

## 2016-07-03 DIAGNOSIS — K580 Irritable bowel syndrome with diarrhea: Secondary | ICD-10-CM

## 2016-07-03 DIAGNOSIS — Z1211 Encounter for screening for malignant neoplasm of colon: Secondary | ICD-10-CM | POA: Diagnosis not present

## 2016-07-03 DIAGNOSIS — Z23 Encounter for immunization: Secondary | ICD-10-CM

## 2016-07-03 DIAGNOSIS — E119 Type 2 diabetes mellitus without complications: Secondary | ICD-10-CM | POA: Diagnosis not present

## 2016-07-03 DIAGNOSIS — E7800 Pure hypercholesterolemia, unspecified: Secondary | ICD-10-CM

## 2016-07-03 LAB — COMPREHENSIVE METABOLIC PANEL WITH GFR
ALT: 22 U/L (ref 6–29)
AST: 24 U/L (ref 10–35)
Albumin: 3.8 g/dL (ref 3.6–5.1)
Alkaline Phosphatase: 42 U/L (ref 33–130)
BUN: 11 mg/dL (ref 7–25)
CO2: 25 mmol/L (ref 20–31)
Calcium: 9.2 mg/dL (ref 8.6–10.4)
Chloride: 102 mmol/L (ref 98–110)
Creat: 1.08 mg/dL — ABNORMAL HIGH (ref 0.50–0.99)
Glucose, Bld: 184 mg/dL — ABNORMAL HIGH (ref 70–99)
Potassium: 4.4 mmol/L (ref 3.5–5.3)
Sodium: 139 mmol/L (ref 135–146)
Total Bilirubin: 0.6 mg/dL (ref 0.2–1.2)
Total Protein: 7.2 g/dL (ref 6.1–8.1)

## 2016-07-03 LAB — CBC WITH DIFFERENTIAL/PLATELET
Basophils Absolute: 82 cells/uL (ref 0–200)
Basophils Relative: 1 %
EOS ABS: 164 {cells}/uL (ref 15–500)
Eosinophils Relative: 2 %
HEMATOCRIT: 35.9 % (ref 35.0–45.0)
HEMOGLOBIN: 11.6 g/dL — AB (ref 12.0–15.0)
Lymphocytes Relative: 36 %
Lymphs Abs: 2952 cells/uL (ref 850–3900)
MCH: 26.1 pg — ABNORMAL LOW (ref 27.0–33.0)
MCHC: 32.3 g/dL (ref 32.0–36.0)
MCV: 80.9 fL (ref 80.0–100.0)
MONO ABS: 656 {cells}/uL (ref 200–950)
MPV: 10.5 fL (ref 7.5–12.5)
Monocytes Relative: 8 %
NEUTROS PCT: 53 %
Neutro Abs: 4346 cells/uL (ref 1500–7800)
Platelets: 250 10*3/uL (ref 140–400)
RBC: 4.44 MIL/uL (ref 3.80–5.10)
RDW: 15.1 % — ABNORMAL HIGH (ref 11.0–15.0)
WBC: 8.2 10*3/uL (ref 3.8–10.8)

## 2016-07-03 LAB — T3, FREE: T3 FREE: 3.1 pg/mL (ref 2.3–4.2)

## 2016-07-03 LAB — LIPID PANEL
Cholesterol: 186 mg/dL (ref 125–200)
HDL: 57 mg/dL (ref >=46)
LDL CALC: 102 mg/dL (ref <130)
Total CHOL/HDL Ratio: 3.3 Ratio (ref <=5.0)
Triglycerides: 133 mg/dL (ref <150)
VLDL: 27 mg/dL (ref <30)

## 2016-07-03 LAB — TSH: TSH: 1.02 m[IU]/L

## 2016-07-03 LAB — T4, FREE: Free T4: 1.5 ng/dL (ref 0.8–1.8)

## 2016-07-03 MED ORDER — GLIMEPIRIDE 2 MG PO TABS: ORAL_TABLET | ORAL | 2 refills | Status: DC

## 2016-07-03 MED ORDER — DEXLANSOPRAZOLE 60 MG PO CPDR: 60.0000 mg | DELAYED_RELEASE_CAPSULE | Freq: Every day | ORAL | 6 refills | Status: DC

## 2016-07-03 MED ORDER — LOSARTAN POTASSIUM 25 MG PO TABS: 25.0000 mg | ORAL_TABLET | Freq: Every morning | ORAL | 2 refills | Status: DC

## 2016-07-03 MED ORDER — ZOSTER VACCINE LIVE 19400 UNT/0.65ML ~~LOC~~ SUSR: 0.6500 mL | Freq: Once | SUBCUTANEOUS | 0 refills | Status: AC

## 2016-07-03 NOTE — Addendum Note (Signed)
Addended by: Sheral Flow on: 07/03/2016 12:28 PM   Modules accepted: Orders

## 2016-07-03 NOTE — Progress Notes (Signed)
Subjective:    Patient ID: [name], female    DOB: 03/25/1946, 70 y.o.   MRN: [id number]  Patient presents for 3 month F/U (is not fasting); R Foot Pain (describes neuropathic pain); and Throat Pain (reports discomfort while eating, states that she contineues to have increaed GI issues after eatingas well)    Here to follow up chronic medical problem severe diabetes mellitus is uncontrolled her last A1c was 9.8% but she refuses injectable medications. She is on metformin 1000 mg extended release and glimepiride   she is on ARB inhibitor Do not bring her meter with her today but states her blood sugars have still been high. She states that she been doing research and was concerned that her diabetes actually came from after her chemotherapy treatments for her breast cancer. She's also noticed tingling and numbness in her feet bilaterally over the past few months   She should have an increased discomfort with eating bloating, diarrhea. She has known history of gastroparesis also is history of esophageal reflux and irritable bowel syndrome. She still takes Imodium when she needs it. She actually stopped her dexilant a few months ago as well. She is overdue for her repeat colonoscopy is history of diverticulosis   She has history of thyroid nodules last checked in 2016 she feels like her thyroid is also been swelling and this sometimes makes it difficult for her to swallow.  Note there is underlying stress that she is the only one with any income to the family  Review Of Systems: per above   GEN- + fatigue, denies fever, weight loss,weakness, recent illness HEENT- denies eye drainage, change in vision, nasal discharge, CVS- denies chest pain, palpitations RESP- denies SOB, cough, wheeze ABD- denies N/V,+ change in stools, abd pain GU- denies dysuria, hematuria, dribbling, incontinence MSK- denies joint pain, muscle aches, injury Neuro- denies headache, dizziness, syncope, seizure  activity       Objective:    BP 132/78 (BP Location: Left Arm, Patient Position: Sitting, Cuff Size: Large)   Pulse 78   Temp 98.2 F (36.8 C) (Oral)   Resp 16   Ht 5\' 6"  (1.676 m)   Wt 203 lb (92.1 kg)   BMI 32.77 kg/m  GEN- NAD, alert and oriented x3 HEENT- PERRL, EOMI, non injected sclera, pink conjunctiva, MMM, oropharynx clear Neck- Supple, + thyromegaly,small nodule palpated riht side  CVS- RRR, no murmur RESP-CTAB ABD-NABS,soft,NT,ND  EXT- No edema, skin feet- no open region, callus, thick toenails, normal monofilament  Pulses- Radial, DP- 2+        Assessment & Plan:      Problem List Items Addressed This Visit    Type 2 diabetes mellitus ([facility])   Relevant Medications   losartan (COZAAR) 25 MG tablet   glimepiride (AMARYL) 2 MG tablet   Other Relevant Orders   CBC with Differential/Platelet   Comprehensive metabolic panel   Hemoglobin A1c   Thyromegaly   Relevant Orders   TSH   T3, free   T4, free   IBS   Relevant Medications   dexlansoprazole (DEXILANT) 60 MG capsule   Hyperlipidemia - Primary   Relevant Medications   losartan (COZAAR) 25 MG tablet   Other Relevant Orders   Lipid panel   Essential hypertension    Controlled no change to medication      Relevant Medications   losartan (COZAAR) 25 MG tablet   Esophageal reflux    Restart dexilant as could be contributing to some  of her symptoms she also occasionally gets food and pills stuck. She may have some stricture as well. I will get her set back up with gas and serology. There is a caveat of the thyromegaly with known small knob also our repeat ultrasound on her thyroid as well.  Diabetes mellitus uncontrolled and she is against injectable medication. I've given her Jardiance 10mg  samples to try depending on her A1c results. Discussed the importance of good diabetes better control she is already getting symptoms of neuropathy and this will eventually start to affect her other organs.       Relevant Medications   dexlansoprazole (DEXILANT) 60 MG capsule    Other Visit Diagnoses   None.     Note: This dictation was prepared with Dragon dictation along with smaller phrase technology. Any transcriptional errors that result from this process are unintentional.

## 2016-07-03 NOTE — Patient Instructions (Signed)
Take dexilant  Flu shot today  Ultrasound on thyroid  Labs to be done Referral to Dr. Oneida Alar  F/U 3 months

## 2016-07-03 NOTE — Assessment & Plan Note (Signed)
Restart dexilant as could be contributing to some of her symptoms she also occasionally gets food and pills stuck. She may have some stricture as well. I will get her set back up with gas and serology. There is a caveat of the thyromegaly with known small knob also our repeat ultrasound on her thyroid as well.  Diabetes mellitus uncontrolled and she is against injectable medication. I've given her Jardiance 10mg  samples to try depending on her A1c results. Discussed the importance of good diabetes better control she is already getting symptoms of neuropathy and this will eventually start to affect her other organs.

## 2016-07-03 NOTE — Assessment & Plan Note (Addendum)
Controlled no change to medication 

## 2016-07-04 LAB — HEMOGLOBIN A1C
HEMOGLOBIN A1C: 7.9 % — AB (ref <5.7)
Mean Plasma Glucose: 180 mg/dL

## 2016-07-08 ENCOUNTER — Encounter: Payer: Self-pay | Admitting: Gastroenterology

## 2016-07-11 ENCOUNTER — Ambulatory Visit (HOSPITAL_COMMUNITY)
Admission: RE | Admit: 2016-07-11 | Discharge: 2016-07-11 | Disposition: A | Discharge status: Home / Self Care | Payer: Medicare Other | Source: Ambulatory Visit | Attending: Family Medicine | Admitting: Family Medicine

## 2016-07-11 ENCOUNTER — Encounter: Payer: Self-pay | Admitting: Family Medicine

## 2016-07-11 DIAGNOSIS — E041 Nontoxic single thyroid nodule: Secondary | ICD-10-CM | POA: Diagnosis not present

## 2016-07-11 DIAGNOSIS — E010 Iodine-deficiency related diffuse (endemic) goiter: Secondary | ICD-10-CM | POA: Diagnosis present

## 2016-07-16 ENCOUNTER — Ambulatory Visit (HOSPITAL_COMMUNITY): Payer: Medicare Other | Admitting: Oncology

## 2016-07-29 ENCOUNTER — Ambulatory Visit: Payer: Medicare Other | Admitting: Gastroenterology

## 2016-08-13 ENCOUNTER — Ambulatory Visit (HOSPITAL_COMMUNITY): Payer: Medicare Other | Admitting: Oncology

## 2016-08-13 NOTE — Progress Notes (Deleted)
[name], [place] [address]  No diagnosis found.  CURRENT THERAPY: Surveillance per NCCN guidelines  INTERVAL HISTORY: [name] 70 y.o. female returns for followup of  right-sided breast cancer measuring 1.4 x 1.2 cm, grade 2, 8 negative axillary lymph nodes for metastatic disease, ER and PR positive. S/P right mastectomy on 10/17/1995, followed by adjuvant chemotherapy consisting of AC x 4 cycles followed by Tamoxifen for 5 years ending as of 04/09/2001.   ROS  Past Medical History:  Diagnosis Date  . Abdominal hernia 02/26/12   unrepaired  . 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 Anemia   . Blood transfusion 1980's  . Complication of anesthesia    has a hard time waking up; can't lay flat  . Degenerative disc disease, lumbar    pressing on L3 and L4  . Diabetes mellitus ([place]) 1989   Type 2 NIDDM; "cancer treatment gave me diabetes"  . DJD (degenerative joint disease) of lumbar spine   . Dysrhythmia    "irregular"  . Exertional dyspnea   . GERD (gastroesophageal reflux disease)    mann  . Heart murmur    "think I outgrew it"  . History of stomach ulcers   . Hx: UTI (urinary tract infection)   . Hyperlipidemia   . Hypersensitivity    in tongue  . Hypertension   . IBS (irritable bowel syndrome)   . Insomnia   . Kidney cysts    RT KIDNEY  . Kidney stones 2009   s/p lithotripsy  . Obesity   . PONV (postoperative nausea and vomiting)   . Shortness of breath    unable to lie flat    Past Surgical History:  Procedure Laterality Date  . ABDOMINAL HYSTERECTOMY  2002  . APPENDECTOMY    . BACK SURGERY    . BREAST BIOPSY     right  . CATARACT EXTRACTION W/ INTRAOCULAR LENS  IMPLANT, BILATERAL  2009-2010  . CESAREAN SECTION  1973; 1978; 1981  . CYSTOSCOPY Left 04/13/2014   Procedure: CYSTOSCOPY FLEXIBLE;  Surgeon: Ardis Hughs, MD;  Location: [facility];  Service: Urology;   Laterality: Left;  with STENT  . EYE SURGERY    . implant and screws put in the right lower jaw  11/2008   dental surgery  . kidney blockage  ?1990's   " major surgery ;put kidney on pump for awhile"  . LITHOTRIPSY     "several times"  . LUMBAR FUSION  08/2010  . MASTECTOMY  1997   right  . NEPHROLITHOTOMY Left 04/13/2014   Procedure: LEFT PERCUTANEOUS NEPHROLITHOTOMY WITH SURGEON ACCESS;  Surgeon: Ardis Hughs, MD;  Location: [facility];  Service: Urology;  Laterality: Left;  . OVARIAN CYST SURGERY    . PARATHYROIDECTOMY  02/26/2012   Procedure: PARATHYROIDECTOMY;  Surgeon: Ascencion Dike, MD;  Location: [facility];  Service: ENT;;  . [place]  2011  . urological surgery for blocked ureter secondary to kidney stone      Family History  Problem Relation Age of Onset  . Heart attack Mother   . Cancer Brother     Esophageal; smoker; deceased 46s  . Diabetes Daughter   . Leukemia Paternal Aunt     deceased 34s  . Pancreatic cancer Paternal Aunt     deceased 52  . Cancer Paternal Aunt  GI cancer; deceased 46s  . Cancer Cousin     kidney cancer; deceased 59; pat first cousin; daughter of aunt w/ leukemia  . Cancer Cousin     colon cancer @ 4; pat first cousin; son of aunt with GI cancer  . Anesthesia problems Neg Hx     Social History   Social History  . Marital status: Married    Spouse name: N/A  . Number of children: 3  . Years of education: N/A   Occupational History  . retired in [place]  . Smoking status: Never Smoker  . Smokeless tobacco: Never Used  . Alcohol use No     Comment: X2 DRINKS PER YEAR  . Drug use: No  . Sexual activity: Not Currently   Other Topics Concern  . Not on file   Social History Narrative  . No narrative on file     PHYSICAL EXAMINATION  ECOG PERFORMANCE STATUS: {CHL ONC ECOG PS:[phone]}  There were no vitals filed for this visit.  GENERAL:{CHL ONC PE [phone]} SKIN: {CHL ONC PE  JI:[id number] HEAD: {CHL ONC PE DC:184310 EYES: {CHL ONC PE WX:[id number] EARS: {CHL ONC PE DJ:[id number] OROPHARYNX:{CHL ONC PE [phone]}  NECK: {CHL ONC PE OK:[id number] LYMPH:  {CHL ONC PE SG:[id number] BREAST:{CHL ONC PE BREAST:[phone]} LUNGS: {CHL ONC PE CW:[id number] HEART: {CHL ONC PE TN:[id number] ABDOMEN:{CHL ONC PE ABDOMEN:[phone]} BACK: {CHL ONC PE EV:[id number] EXTREMITIES:{CHL ONC PE EXTREMITIES:[phone]}  NEURO: {CHL ONC PE NEURO:[phone]} PELVIC:{CHL ONC PE PELVIC:[phone]} RECTAL: {CHL ONC PE [phone]}   LABORATORY DATA: CBC    Component Value Date/Time   WBC 8.2 07/03/2016 1203   RBC 4.44 07/03/2016 1203   HGB 11.6 (L) 07/03/2016 1203   HCT 35.9 07/03/2016 1203   PLT 250 07/03/2016 1203   MCV 80.9 07/03/2016 1203   MCH 26.1 (L) 07/03/2016 1203   MCHC 32.3 07/03/2016 1203   RDW 15.1 (H) 07/03/2016 1203   LYMPHSABS 2,952 07/03/2016 1203   MONOABS 656 07/03/2016 1203   EOSABS 164 07/03/2016 1203   BASOSABS 82 07/03/2016 1203      Chemistry      Component Value Date/Time   NA 139 07/03/2016 1203   K 4.4 07/03/2016 1203   CL 102 07/03/2016 1203   CO2 25 07/03/2016 1203   BUN 11 07/03/2016 1203   CREATININE 1.08 (H) 07/03/2016 1203      Component Value Date/Time   CALCIUM 9.2 07/03/2016 1203   CALCIUM 9.9 02/26/2012 1551   ALKPHOS 42 07/03/2016 1203   AST 24 07/03/2016 1203   ALT 22 07/03/2016 1203   BILITOT 0.6 07/03/2016 1203        PENDING LABS:   RADIOGRAPHIC STUDIES:  No results found.   PATHOLOGY:    ASSESSMENT AND PLAN:  No problem-specific Assessment & Plan notes found for this encounter.   ORDERS PLACED FOR THIS ENCOUNTER: No orders of the defined types were placed in this encounter.   MEDICATIONS PRESCRIBED THIS ENCOUNTER: No orders of the defined types were placed in this encounter.   THERAPY PLAN:  ***  All questions were answered. The patient  knows to call the clinic with any problems, questions or concerns. We can certainly see the patient much sooner if necessary.  Patient and plan discussed with Dr. Ancil Linsey and she is in agreement with the aforementioned.   This note is electronically signed by: Doy Mince 08/13/2016 9:19 AM

## 2016-08-26 ENCOUNTER — Ambulatory Visit (INDEPENDENT_AMBULATORY_CARE_PROVIDER_SITE_OTHER): Payer: Medicare Other | Admitting: Family Medicine

## 2016-08-26 ENCOUNTER — Encounter: Payer: Self-pay | Admitting: Family Medicine

## 2016-08-26 VITALS — BP 132/78 | HR 80 | Temp 98.6°F | Resp 16 | Ht 66.0 in | Wt 196.0 lb

## 2016-08-26 DIAGNOSIS — J209 Acute bronchitis, unspecified: Secondary | ICD-10-CM

## 2016-08-26 DIAGNOSIS — J0100 Acute maxillary sinusitis, unspecified: Secondary | ICD-10-CM | POA: Diagnosis not present

## 2016-08-26 DIAGNOSIS — E119 Type 2 diabetes mellitus without complications: Secondary | ICD-10-CM

## 2016-08-26 DIAGNOSIS — R55 Syncope and collapse: Secondary | ICD-10-CM | POA: Diagnosis not present

## 2016-08-26 LAB — CBC
HCT: 36.2 % (ref 35.0–45.0)
Hemoglobin: 11.6 g/dL — ABNORMAL LOW (ref 12.0–15.0)
MCH: 26.1 pg — ABNORMAL LOW (ref 27.0–33.0)
MCHC: 32 g/dL (ref 32.0–36.0)
MCV: 81.3 fL (ref 80.0–100.0)
PLATELETS: 270 10*3/uL (ref 140–400)
RBC: 4.45 MIL/uL (ref 3.80–5.10)
RDW: 15 % (ref 11.0–15.0)
WBC: 12.1 10*3/uL — AB (ref 3.8–10.8)

## 2016-08-26 LAB — BASIC METABOLIC PANEL
BUN: 13 mg/dL (ref 7–25)
CHLORIDE: 103 mmol/L (ref 98–110)
CO2: 25 mmol/L (ref 20–31)
Calcium: 9.2 mg/dL (ref 8.6–10.4)
Creat: 0.96 mg/dL (ref 0.50–0.99)
Glucose, Bld: 217 mg/dL — ABNORMAL HIGH (ref 70–99)
POTASSIUM: 4.4 mmol/L (ref 3.5–5.3)
Sodium: 139 mmol/L (ref 135–146)

## 2016-08-26 LAB — GLUCOSE, FINGERSTICK (STAT): Glucose, fingerstick: 219 mg/dL — ABNORMAL HIGH (ref 70–99)

## 2016-08-26 MED ORDER — GUAIFENESIN-CODEINE 100-10 MG/5ML PO SOLN: 5.0000 mL | Freq: Four times a day (QID) | ORAL | 0 refills | Status: DC | PRN

## 2016-08-26 MED ORDER — AMOXICILLIN-POT CLAVULANATE 875-125 MG PO TABS: 1.0000 | ORAL_TABLET | Freq: Two times a day (BID) | ORAL | 0 refills | Status: DC

## 2016-08-26 NOTE — Progress Notes (Signed)
Subjective:    Patient ID: [name], female    DOB: 1946-06-20, 70 y.o.   MRN: [id number]  Patient presents for Illness (x3 weeks- fatigue, productive cough with yellow mucus, malaise, sinus pressure, HA) and S/P Fall (lost balance and fell- hit L shoulder and L hip during fall- hit head against iron barstool- states that she lost conciousness x few minutes- had round knot on L side of head the size of golf ball)   2 weeks ago, started with sore throat, cough, congestion, sinus pressure congestion. Has low grade fever at night. Taking Aleve and tyenol, Therma Flu. Cough is productive thick sputum. Thermaflu made her feel light headed. No other cough medicine. Took lemon tea/honey , vicks like salve, humidifier  Now with diarrhea a few times a few day. No N/V, Her appetite has decreased as well she gets some discomfort when she eats and just feels weak. Denies any UTI symptoms  Last week, was walking in kitchen felt weak and felt herself going down, head hit the barstool, had a knot on head,- used  helped some,  not sure if she passed out before or after.Her daughter was there. She did not have any vomiting afterwards no change in mentation. She was sick during this time. He did not go to the emergency room she thought that she would get better on her own She also has some soreness in her shoulder    Note she also stopped taking the Jardiance  after she had a yeast infection which she did not treat until after 2 weeks with over-the-counter medications  Has been taking medication, but not taking blood sugar    Review Of Systems:  GEN- denies fatigue,+ fever, weight loss,weakness, recent illness HEENT- denies eye drainage, change in vision, +nasal discharge, CVS- denies chest pain, palpitations RESP- denies SOB, +cough, wheeze ABD- denies N/V, +change in stools, abd pain GU- denies dysuria, hematuria, dribbling, incontinence MSK- + joint pain, muscle aches, injury Neuro- denies  headache, dizziness, syncope, seizure activity       Objective:    BP 132/78 (BP Location: Left Arm, Patient Position: Sitting, Cuff Size: Large)   Pulse 80   Temp 98.6 F (37 C) (Oral)   Resp 16   Ht 5\' 6"  (1.676 m)   Wt 196 lb (88.9 kg)   SpO2 98%   BMI 31.64 kg/m  GEN- NAD, alert and oriented x3, ill appearing  HEENT- PERRL, EOMI, non injected sclera, pink conjunctiva, MMM, oropharynx clear, nares +rhinorrhea, TTP maxillary sinus, Neck- Supple, no thyromegaly CVS- RRR, no murmur RESP- mild upper airway congestion, no wheeze, no rales, good air movement  Neuro-CNII-XII in tact,mild TTP left parietal area, no palpable hematoma, no laceration, no focal deficits,  MSK - good ROM upper ext, mild TTP over delotid, biceps in tact  EXT- No edema Pulses- Radial 2+        Assessment & Plan:      Problem List Items Addressed This Visit    Type 2 diabetes mellitus ([place]) - Primary    Recommend trying jardiance again and not waiting to treat yeast infection if this reoccurs      Relevant Orders   CBC   Basic metabolic panel   Glucose, fingerstick (stat)    Other Visit Diagnoses    Acute non-recurrent maxillary sinusitis       sinusitis with bronchitis, high risk for PNA as well, with uncontrolled DM and her age. Will give her augmentin, robitussin AC, nasal  saline    Relevant Medications   guaiFENesin-codeine 100-10 MG/5ML syrup   Acute bronchitis, unspecified organism       Check CBC, BMET, Glucose, discussed small meals   Pre-syncope       unclear if true syncrope or pre-syncrope in setting of illness. No deficits noted today, mild shoulder pain, but good ROM      Note: This dictation was prepared with Dragon dictation along with smaller phrase technology. Any transcriptional errors that result from this process are unintentional.

## 2016-08-26 NOTE — Patient Instructions (Addendum)
Take antibiotics as prescribed Use the cough medicine  We will call with lab results F/U as previous

## 2016-08-26 NOTE — Assessment & Plan Note (Addendum)
Recommend trying jardiance again and not waiting to treat yeast infection if this reoccurs

## 2016-09-30 ENCOUNTER — Ambulatory Visit (INDEPENDENT_AMBULATORY_CARE_PROVIDER_SITE_OTHER): Payer: Medicare Other | Admitting: Gastroenterology

## 2016-09-30 ENCOUNTER — Telehealth: Payer: Self-pay

## 2016-09-30 ENCOUNTER — Encounter: Payer: Self-pay | Admitting: Gastroenterology

## 2016-09-30 ENCOUNTER — Other Ambulatory Visit: Payer: Self-pay

## 2016-09-30 DIAGNOSIS — R1013 Epigastric pain: Secondary | ICD-10-CM | POA: Insufficient documentation

## 2016-09-30 DIAGNOSIS — Z1211 Encounter for screening for malignant neoplasm of colon: Secondary | ICD-10-CM | POA: Diagnosis not present

## 2016-09-30 DIAGNOSIS — R1310 Dysphagia, unspecified: Secondary | ICD-10-CM

## 2016-09-30 DIAGNOSIS — R1319 Other dysphagia: Secondary | ICD-10-CM | POA: Insufficient documentation

## 2016-09-30 DIAGNOSIS — K219 Gastro-esophageal reflux disease without esophagitis: Secondary | ICD-10-CM

## 2016-09-30 NOTE — Telephone Encounter (Signed)
Called and informed pt of pre-op appt 10/24/16 at 10:00 am. Letter also mailed.

## 2016-09-30 NOTE — Telephone Encounter (Signed)
PA submitted online via [facility] website for TCS/EGD/+/-ED. Notification/Prior Authorization Number could not be assigned at this time.

## 2016-09-30 NOTE — Progress Notes (Signed)
CC'D TO PCP °

## 2016-09-30 NOTE — Patient Instructions (Signed)
1. Colonoscopy and upper endoscopy with Dr. Fields. See separate instructions.  

## 2016-09-30 NOTE — Progress Notes (Addendum)
REVIEWED-NO ADDITIONAL RECOMMENDATIONS. Primary Care Physician:  [name], MD  Primary Gastroenterologist:  Barney Drain, MD   Chief Complaint  Patient presents with  . Gastroesophageal Reflux    HPI:  [name] is a 71 y.o. female here for follow up. She is due for screening colonoscopy. Last full colonoscopy in 2006 by Dr. Collene Mares. She had normal except except for small internal hemorrhoids and isolated diverticulum in the right colon. She had flex sig in 07/2008 along with EGD by Dr. Oneida Alar for abd pain/bloating, diarrhea. She had patent Schatzki ring, gastritis but no h.pylori. Random colon bx negative. She had additional flex sig and EGD in 2011 at [facility] with negative random colon bx, normal EGD.   She has learned to live with her GI symptoms. She "lives on" Imodium. If she has to go somewhere she takes it. She has postprandial urgency and loss stools. Occasional nocturnal diarrhea but rare. No blood in the stool or melena. Tries to avoid dairy. Will use Lactaid at times. Also concerns regarding reflux. Regurgitates frequently when she lays down, stooping over. Occurs with and without meals. Sleeps sitting up. Doesn't seem to matter what she eats. Takes Dexilant when symptoms get bad. Feels like food sticks in the upper esophagus area.  Having nightmares about possibly having procedures done. She always worries about anesthesia. She states it doesn't take much to get her to go to sleep. "They always have difficulty waking me up." Had dental implant done, had to "do a reversal" to get me to wake up. Did well with anesthesia provided at time of her parathyroid surgery 2013. Did well with anesthesia provided at time of her procedures at [facility] in 2011 (EGD/flexible sigmoidoscopy). This was done with monitored anesthesia care.  Since we last saw her she did have a trial off metformin but really didn't notice a change in her loose stools. She does note that  if she takes that foreman with food she has less GI issues.     Current Outpatient Prescriptions  Medication Sig Dispense Refill  . acetaminophen (TYLENOL) 325 MG tablet Take 650 mg by mouth every 6 (six) hours as needed for mild pain.    Marland Kitchen BYSTOLIC 20 MG TABS TAKE 1 TABLET BY MOUTH TWICE DAILY. 180 tablet 1  . CALCIUM & MAGNESIUM CARBONATES PO Take 1 tablet by mouth daily.    . Cholecalciferol (VITAMIN D-3) 5000 UNITS TABS Take by mouth daily.    Marland Kitchen CINNAMON PO Take 1 capsule by mouth daily.    Marland Kitchen dexlansoprazole (DEXILANT) 60 MG capsule Take 1 capsule (60 mg total) by mouth daily. 30 capsule 6  . docusate sodium 100 MG CAPS Take 100 mg by mouth 2 (two) times daily. (Patient taking differently: Take 100 mg by mouth 2 (two) times daily as needed. ) 60 capsule 0  . glimepiride (AMARYL) 2 MG tablet TAKE 2 TABLETS BY MOUTH EVERY MORNING AND 1 TABLET AT BEDTIME. 270 tablet 2  . LORazepam (ATIVAN) 0.5 MG tablet Take 1 tablet (0.5 mg total) by mouth at bedtime as needed for anxiety. 30 tablet 2  . losartan (COZAAR) 25 MG tablet Take 1 tablet (25 mg total) by mouth every morning. 90 tablet 2  . metFORMIN (GLUCOPHAGE-XR) 500 MG 24 hr tablet TAKE 2 TABLETS BY MOUTH DAILY. 180 tablet 1  . Multiple Vitamin (MULITIVITAMIN WITH MINERALS) TABS Take 1 tablet by mouth daily.     No current facility-administered medications for this visit.  Allergies as of 09/30/2016 - Review Complete 09/30/2016  Allergen Reaction Noted  . Demerol [meperidine] Other (See Comments) 02/10/2012  . Invokana [canagliflozin] Hives 10/26/2014  . Metformin and related Diarrhea 10/26/2014  . Statins Other (See Comments) 08/22/2010  . Ace inhibitors Cough 02/09/2008    Past Medical History:  Diagnosis Date  . Abdominal hernia 02/26/12   unrepaired  . 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 Anemia   . Blood transfusion 1980's  . Complication of anesthesia    has a hard time waking up;  can't lay flat  . Degenerative disc disease, lumbar    pressing on L3 and L4  . Diabetes mellitus ([place]) 1989   Type 2 NIDDM; "cancer treatment gave me diabetes"  . DJD (degenerative joint disease) of lumbar spine   . Dysrhythmia    "irregular"  . Exertional dyspnea   . GERD (gastroesophageal reflux disease)    mann  . Heart murmur    "think I outgrew it"  . History of stomach ulcers   . Hx: UTI (urinary tract infection)   . Hyperlipidemia   . Hypersensitivity    in tongue  . Hypertension   . IBS (irritable bowel syndrome)   . Insomnia   . Kidney cysts    RT KIDNEY  . Kidney stones 2009   s/p lithotripsy  . Obesity   . PONV (postoperative nausea and vomiting)   . Shortness of breath    unable to lie flat    Past Surgical History:  Procedure Laterality Date  . ABDOMINAL HYSTERECTOMY  2002  . APPENDECTOMY    . BACK SURGERY    . BREAST BIOPSY     right  . CATARACT EXTRACTION W/ INTRAOCULAR LENS  IMPLANT, BILATERAL  2009-2010  . CESAREAN SECTION  1973; 1978; 1981  . CYSTOSCOPY Left 04/13/2014   Procedure: CYSTOSCOPY FLEXIBLE;  Surgeon: Ardis Hughs, MD;  Location: [facility];  Service: Urology;  Laterality: Left;  with STENT  . EYE SURGERY    . implant and screws put in the right lower jaw  11/2008   dental surgery  . kidney blockage  ?1990's   " major surgery ;put kidney on pump for awhile"  . LITHOTRIPSY     "several times"  . LUMBAR FUSION  08/2010  . MASTECTOMY  1997   right  . NEPHROLITHOTOMY Left 04/13/2014   Procedure: LEFT PERCUTANEOUS NEPHROLITHOTOMY WITH SURGEON ACCESS;  Surgeon: Ardis Hughs, MD;  Location: [facility];  Service: Urology;  Laterality: Left;  . OVARIAN CYST SURGERY    . PARATHYROIDECTOMY  02/26/2012   Procedure: PARATHYROIDECTOMY;  Surgeon: Ascencion Dike, MD;  Location: [facility];  Service: ENT;;  . [place]  2011  . urological surgery for blocked ureter secondary to kidney stone      Family History  Problem Relation Age of Onset  .  Heart attack Mother   . Cancer Brother     Esophageal; smoker; deceased 49s  . Diabetes Daughter   . Leukemia Paternal Aunt     deceased 51s  . Pancreatic cancer Paternal Aunt     deceased 38  . Cancer Paternal Aunt     GI cancer; deceased 74s  . Cancer Cousin     kidney cancer; deceased 6; pat first cousin; daughter of aunt w/ leukemia  . Cancer Cousin     colon cancer @ 59; pat first cousin; son of aunt  with GI cancer  . Anesthesia problems Neg Hx     Social History   Social History  . Marital status: Married    Spouse name: N/A  . Number of children: 3  . Years of education: N/A   Occupational History  . retired in [place]  . Smoking status: Never Smoker  . Smokeless tobacco: Never Used  . Alcohol use No     Comment: X2 DRINKS PER YEAR  . Drug use: No  . Sexual activity: Not Currently   Other Topics Concern  . Not on file   Social History Narrative  . No narrative on file      ROS:  General: Negative for anorexia, weight loss, fever, chills, fatigue, weakness. Eyes: Negative for vision changes.  ENT: Negative for hoarseness,  nasal congestion. See hpi. CV: Negative for chest pain, angina, palpitations, dyspnea on exertion, peripheral edema.  Respiratory: Negative for dyspnea at rest, dyspnea on exertion, cough, sputum, wheezing.  GI: See history of present illness. GU:  Negative for dysuria, hematuria, urinary incontinence, urinary frequency, nocturnal urination.  MS: Negative for joint pain, low back pain.  Derm: Negative for rash or itching.  Neuro: Negative for weakness, abnormal sensation, seizure, frequent headaches, memory loss, confusion.  Psych: Negative for anxiety, depression, suicidal ideation, hallucinations.  Endo: Negative for unusual weight change.  Heme: Negative for bruising or bleeding. Allergy: Negative for rash or hives.    Physical Examination:  BP 130/73   Pulse 83   Temp 98 F (36.7 C) (Oral)    Ht 5\' 7"  (1.702 m)   Wt 193 lb 12.8 oz (87.9 kg)   BMI 30.35 kg/m    General: Well-nourished, well-developed in no acute distress.  Head: Normocephalic, atraumatic.   Eyes: Conjunctiva pink, no icterus. Mouth: Oropharyngeal mucosa moist and pink , no lesions erythema or exudate. Neck: Supple without thyromegaly, masses, or lymphadenopathy.  Lungs: Clear to auscultation bilaterally.  Heart: Regular rate and rhythm, no murmurs rubs or gallops.  Abdomen: Bowel sounds are normal, mild epig tenderness, +rectus diastasis, nondistended, no hepatosplenomegaly or masses, no abdominal bruits or    hernia , no rebound or guarding.   Rectal: deferred Extremities: No lower extremity edema. No clubbing or deformities.  Neuro: Alert and oriented x 4 , grossly normal neurologically.  Skin: Warm and dry, no rash or jaundice.   Psych: Alert and cooperative, normal mood and affect.  Labs: Lab Results  Component Value Date   WBC 12.1 (H) 08/26/2016   HGB 11.6 (L) 08/26/2016   HCT 36.2 08/26/2016   MCV 81.3 08/26/2016   PLT 270 08/26/2016     Imaging Studies: No results found.  Impression/plan  71 year old female presents for routine follow-up to schedule screening colonoscopy. Last full colonoscopy 2006. She has a history of chronic bloating, diarrhea felt to be related IBS plus or minus diabetic enteropathy in the past. Symptoms overall stable. Self treats with Imodium. She also complains of refractory GERD with regurgitation, some epigastric discomfort, solid food esophageal dysphagia. Cannot exclude complicated GERD, i.e. Stricture/web/ring.  All for colonoscopy, EGD with possible dilation in the near future in the OR. Patient voiced concerns regarding anesthesia stating that she does not require much and it takes a long time to come out of it. Notes this previously with conscious sedation. Reports doing well with prior monitored anesthesia care for parathyroid surgery as well as  EGD/sigmoidoscopy done at [facility].  I have discussed the risks,  alternatives, benefits with regards to but not limited to the risk of reaction to medication, bleeding, infection, perforation and the patient is agreeable to proceed. Written consent to be obtained.

## 2016-10-22 NOTE — Patient Instructions (Signed)
85    Your procedure is scheduled on: 10/29/16  Report to [facility] at  8:15   AM.  Call this number if you have problems the morning of surgery: [phone]   Remember:   Do not drink or eat food:After Midnight.              Follow colon prep instructions from the office    Clear liquids include soda, tea, black coffee, apple or grape juice, broth.  Take these medicines the morning of surgery with A SIP OF WATER: bystolic and dexilant   Do not wear jewelry, make-up or nail polish.  Do not wear lotions, powders, or perfumes. You may wear deodorant.  Do not shave 48 hours prior to surgery. Men may shave face and neck.  Do not bring valuables to the hospital.  Contacts, dentures or bridgework may not be worn into surgery.  Leave suitcase in the car. After surgery it may be brought to your room.  For patients admitted to the hospital, checkout time is 11:00 AM the day of discharge.   Patients discharged the day of surgery will not be allowed to drive home.  Name and phone number of your driver:    Please read over the following fact sheets that you were given: Pain Booklet, Lab Information and Anesthesia Post-op Instructions   Endoscopy Care After Please read the instructions outlined below and refer to this sheet in the next few weeks. These discharge instructions provide you with general information on caring for yourself after you leave the hospital. Your doctor may also give you specific instructions. While your treatment has been planned according to the most current medical practices available, unavoidable complications occasionally occur. If you have any problems or questions after discharge, please call your doctor. HOME CARE INSTRUCTIONS Activity  You may resume your regular activity but move at a slower pace for the next 24 hours.   Take frequent rest periods for the next 24 hours.   Walking will help expel (get rid of) the air and reduce the bloated feeling in your abdomen.    No driving for 24 hours (because of the anesthesia (medicine) used during the test).   You may shower.   Do not sign any important legal documents or operate any machinery for 24 hours (because of the anesthesia used during the test).  Nutrition  Drink plenty of fluids.   You may resume your normal diet.   Begin with a light meal and progress to your normal diet.   Avoid alcoholic beverages for 24 hours or as instructed by your caregiver.  Medications You may resume your normal medications unless your caregiver tells you otherwise. What you can expect today  You may experience abdominal discomfort such as a feeling of fullness or "gas" pains.   You may experience a sore throat for 2 to 3 days. This is normal. Gargling with salt water may help this.  Follow-up Your doctor will discuss the results of your test with you. SEEK IMMEDIATE MEDICAL CARE IF:  You have excessive nausea (feeling sick to your stomach) and/or vomiting.   You have severe abdominal pain and distention (swelling).   You have trouble swallowing.   You have a temperature over 100 F (37.8 C).   You have rectal bleeding or vomiting of blood.  Document Released: 04/23/2004 Document Revised: 08/29/2011 Document Reviewed: 11/04/2007  Colonoscopy, Adult, Care After This sheet gives you information about how to care for yourself after your procedure. Your  health care provider may also give you more specific instructions. If you have problems or questions, contact your health care provider. What can I expect after the procedure? After the procedure, it is common to have:  A small amount of blood in your stool for 24 hours after the procedure.  Some gas.  Mild abdominal cramping or bloating. Follow these instructions at home: General instructions  For the first 24 hours after the procedure:  Do not drive or use machinery.  Do not sign important documents.  Do not drink alcohol.  Do your regular  daily activities at a slower pace than normal.  Eat soft, easy-to-digest foods.  Rest often.  Take over-the-counter or prescription medicines only as told by your health care provider.  It is up to you to get the results of your procedure. Ask your health care provider, or the department performing the procedure, when your results will be ready. Relieving cramping and bloating  Try walking around when you have cramps or feel bloated.  Apply heat to your abdomen as told by your health care provider. Use a heat source that your health care provider recommends, such as a moist heat pack or a heating pad.  Place a towel between your skin and the heat source.  Leave the heat on for 20-30 minutes.  Remove the heat if your skin turns bright red. This is especially important if you are unable to feel pain, heat, or cold. You may have a greater risk of getting burned. Eating and drinking  Drink enough fluid to keep your urine clear or pale yellow.  Resume your normal diet as instructed by your health care provider. Avoid heavy or fried foods that are hard to digest.  Avoid drinking alcohol for as long as instructed by your health care provider. Contact a health care provider if:  You have blood in your stool 2-3 days after the procedure. Get help right away if:  You have more than a small spotting of blood in your stool.  You pass large blood clots in your stool.  Your abdomen is swollen.  You have nausea or vomiting.  You have a fever.  You have increasing abdominal pain that is not relieved with medicine. This information is not intended to replace advice given to you by your health care provider. Make sure you discuss any questions you have with your health care provider. Document Released: 04/23/2004 Document Revised: 06/03/2016 Document Reviewed: 11/21/2015 Elsevier Interactive Patient Education  2017 [employer].

## 2016-10-24 ENCOUNTER — Encounter (HOSPITAL_COMMUNITY): Payer: Self-pay

## 2016-10-24 ENCOUNTER — Encounter (HOSPITAL_COMMUNITY)
Admission: RE | Admit: 2016-10-24 | Discharge: 2016-10-24 | Disposition: A | Discharge status: Home / Self Care | Payer: Medicare Other | Source: Ambulatory Visit | Attending: Gastroenterology | Admitting: Gastroenterology

## 2016-10-24 DIAGNOSIS — R9431 Abnormal electrocardiogram [ECG] [EKG]: Secondary | ICD-10-CM | POA: Insufficient documentation

## 2016-10-24 DIAGNOSIS — Z01812 Encounter for preprocedural laboratory examination: Secondary | ICD-10-CM | POA: Diagnosis not present

## 2016-10-24 DIAGNOSIS — Z01810 Encounter for preprocedural cardiovascular examination: Secondary | ICD-10-CM | POA: Diagnosis not present

## 2016-10-24 HISTORY — DX: Anxiety disorder, unspecified: F41.9

## 2016-10-24 HISTORY — DX: Personal history of urinary calculi: Z87.442

## 2016-10-24 LAB — BASIC METABOLIC PANEL
Anion gap: 9 (ref 5–15)
BUN: 11 mg/dL (ref 6–20)
CHLORIDE: 103 mmol/L (ref 101–111)
CO2: 24 mmol/L (ref 22–32)
CREATININE: 0.9 mg/dL (ref 0.44–1.00)
Calcium: 8.9 mg/dL (ref 8.9–10.3)
GFR calc non Af Amer: >60 mL/min (ref >60)
Glucose, Bld: 247 mg/dL — ABNORMAL HIGH (ref 65–99)
POTASSIUM: 4 mmol/L (ref 3.5–5.1)
SODIUM: 136 mmol/L (ref 135–145)

## 2016-10-24 LAB — CBC WITH DIFFERENTIAL/PLATELET
BASOS PCT: 1 %
Basophils Absolute: 0 10*3/uL (ref 0.0–0.1)
EOS ABS: 0.1 10*3/uL (ref 0.0–0.7)
Eosinophils Relative: 2 %
HEMATOCRIT: 34.7 % — AB (ref 36.0–46.0)
HEMOGLOBIN: 11.2 g/dL — AB (ref 12.0–15.0)
LYMPHS ABS: 2.7 10*3/uL (ref 0.7–4.0)
Lymphocytes Relative: 33 %
MCH: 25.8 pg — ABNORMAL LOW (ref 26.0–34.0)
MCHC: 32.3 g/dL (ref 30.0–36.0)
MCV: 80 fL (ref 78.0–100.0)
MONOS PCT: 9 %
Monocytes Absolute: 0.7 10*3/uL (ref 0.1–1.0)
NEUTROS ABS: 4.6 10*3/uL (ref 1.7–7.7)
NEUTROS PCT: 57 %
Platelets: 204 10*3/uL (ref 150–400)
RBC: 4.34 MIL/uL (ref 3.87–5.11)
RDW: 15.1 % (ref 11.5–15.5)
WBC: 8.2 10*3/uL (ref 4.0–10.5)

## 2016-10-29 ENCOUNTER — Encounter (HOSPITAL_COMMUNITY)
Admission: RE | Disposition: A | Discharge status: Home / Self Care | Payer: Self-pay | Source: Ambulatory Visit | Attending: Gastroenterology

## 2016-10-29 ENCOUNTER — Ambulatory Visit (HOSPITAL_COMMUNITY): Payer: Medicare Other | Admitting: Anesthesiology

## 2016-10-29 ENCOUNTER — Encounter (HOSPITAL_COMMUNITY): Payer: Self-pay | Admitting: *Deleted

## 2016-10-29 ENCOUNTER — Ambulatory Visit (HOSPITAL_COMMUNITY)
Admission: RE | Admit: 2016-10-29 | Discharge: 2016-10-29 | Disposition: A | Discharge status: Home / Self Care | Payer: Medicare Other | Source: Ambulatory Visit | Attending: Gastroenterology | Admitting: Gastroenterology

## 2016-10-29 DIAGNOSIS — F419 Anxiety disorder, unspecified: Secondary | ICD-10-CM | POA: Insufficient documentation

## 2016-10-29 DIAGNOSIS — Z9011 Acquired absence of right breast and nipple: Secondary | ICD-10-CM | POA: Insufficient documentation

## 2016-10-29 DIAGNOSIS — K2970 Gastritis, unspecified, without bleeding: Secondary | ICD-10-CM

## 2016-10-29 DIAGNOSIS — Z79899 Other long term (current) drug therapy: Secondary | ICD-10-CM | POA: Diagnosis not present

## 2016-10-29 DIAGNOSIS — R12 Heartburn: Secondary | ICD-10-CM | POA: Diagnosis not present

## 2016-10-29 DIAGNOSIS — R1310 Dysphagia, unspecified: Secondary | ICD-10-CM | POA: Insufficient documentation

## 2016-10-29 DIAGNOSIS — Z9221 Personal history of antineoplastic chemotherapy: Secondary | ICD-10-CM | POA: Diagnosis not present

## 2016-10-29 DIAGNOSIS — Z1211 Encounter for screening for malignant neoplasm of colon: Secondary | ICD-10-CM | POA: Insufficient documentation

## 2016-10-29 DIAGNOSIS — Z1212 Encounter for screening for malignant neoplasm of rectum: Secondary | ICD-10-CM

## 2016-10-29 DIAGNOSIS — K319 Disease of stomach and duodenum, unspecified: Secondary | ICD-10-CM | POA: Insufficient documentation

## 2016-10-29 DIAGNOSIS — Q439 Congenital malformation of intestine, unspecified: Secondary | ICD-10-CM | POA: Insufficient documentation

## 2016-10-29 DIAGNOSIS — Z853 Personal history of malignant neoplasm of breast: Secondary | ICD-10-CM | POA: Insufficient documentation

## 2016-10-29 DIAGNOSIS — K222 Esophageal obstruction: Secondary | ICD-10-CM | POA: Diagnosis not present

## 2016-10-29 DIAGNOSIS — E119 Type 2 diabetes mellitus without complications: Secondary | ICD-10-CM | POA: Diagnosis not present

## 2016-10-29 DIAGNOSIS — K219 Gastro-esophageal reflux disease without esophagitis: Secondary | ICD-10-CM | POA: Insufficient documentation

## 2016-10-29 DIAGNOSIS — I10 Essential (primary) hypertension: Secondary | ICD-10-CM | POA: Insufficient documentation

## 2016-10-29 DIAGNOSIS — K648 Other hemorrhoids: Secondary | ICD-10-CM | POA: Diagnosis not present

## 2016-10-29 DIAGNOSIS — Z7984 Long term (current) use of oral hypoglycemic drugs: Secondary | ICD-10-CM | POA: Insufficient documentation

## 2016-10-29 DIAGNOSIS — R1013 Epigastric pain: Secondary | ICD-10-CM | POA: Insufficient documentation

## 2016-10-29 DIAGNOSIS — K5730 Diverticulosis of large intestine without perforation or abscess without bleeding: Secondary | ICD-10-CM | POA: Insufficient documentation

## 2016-10-29 DIAGNOSIS — K2960 Other gastritis without bleeding: Secondary | ICD-10-CM | POA: Insufficient documentation

## 2016-10-29 HISTORY — PX: ESOPHAGOGASTRODUODENOSCOPY (EGD) WITH PROPOFOL: SHX5813

## 2016-10-29 HISTORY — PX: SAVORY DILATION: SHX5439

## 2016-10-29 HISTORY — PX: COLONOSCOPY WITH PROPOFOL: SHX5780

## 2016-10-29 LAB — GLUCOSE, CAPILLARY
GLUCOSE-CAPILLARY: 172 mg/dL — AB (ref 65–99)
Glucose-Capillary: 198 mg/dL — ABNORMAL HIGH (ref 65–99)

## 2016-10-29 SURGERY — COLONOSCOPY WITH PROPOFOL
Anesthesia: Monitor Anesthesia Care

## 2016-10-29 MED ORDER — LIDOCAINE VISCOUS 2 % MT SOLN
OROMUCOSAL | Status: AC
  Filled 2016-10-29: qty 15

## 2016-10-29 MED ORDER — PROPOFOL 500 MG/50ML IV EMUL
INTRAVENOUS | Status: DC | PRN
  Administered 2016-10-29: 75 ug/kg/min via INTRAVENOUS
  Administered 2016-10-29: 11:00:00 via INTRAVENOUS

## 2016-10-29 MED ORDER — GLYCOPYRROLATE 0.2 MG/ML IJ SOLN
INTRAMUSCULAR | Status: AC
  Filled 2016-10-29: qty 1

## 2016-10-29 MED ORDER — SUCCINYLCHOLINE CHLORIDE 20 MG/ML IJ SOLN
INTRAMUSCULAR | Status: AC
  Filled 2016-10-29: qty 1

## 2016-10-29 MED ORDER — MIDAZOLAM HCL 2 MG/2ML IJ SOLN
1.0000 mg | INTRAMUSCULAR | Status: DC | PRN
  Administered 2016-10-29: 1 mg via INTRAVENOUS
  Filled 2016-10-29: qty 2

## 2016-10-29 MED ORDER — LACTATED RINGERS IV SOLN
INTRAVENOUS | Status: DC
  Administered 2016-10-29: 1000 mL via INTRAVENOUS

## 2016-10-29 MED ORDER — FENTANYL CITRATE (PF) 100 MCG/2ML IJ SOLN: 25.0000 ug | INTRAMUSCULAR | Status: DC

## 2016-10-29 MED ORDER — CHLORHEXIDINE GLUCONATE CLOTH 2 % EX PADS: 6.0000 | MEDICATED_PAD | Freq: Once | CUTANEOUS | Status: DC

## 2016-10-29 MED ORDER — MINERAL OIL PO OIL
TOPICAL_OIL | ORAL | Status: AC
  Filled 2016-10-29: qty 30

## 2016-10-29 MED ORDER — LIDOCAINE VISCOUS 2 % MT SOLN
15.0000 mL | OROMUCOSAL | Status: AC | PRN
  Administered 2016-10-29: 3 mL via OROMUCOSAL
  Administered 2016-10-29: 5 mL via OROMUCOSAL

## 2016-10-29 NOTE — Anesthesia Postprocedure Evaluation (Signed)
Anesthesia Post Note  Patient: [name]  Procedure(s) Performed: Procedure(s) (LRB): COLONOSCOPY WITH PROPOFOL (N/A) ESOPHAGOGASTRODUODENOSCOPY (EGD) WITH PROPOFOL (N/A) SAVORY DILATION (N/A)  Patient location during evaluation: PACU Anesthesia Type: MAC Level of consciousness: awake and oriented Pain management: pain level controlled Vital Signs Assessment: post-procedure vital signs reviewed and stable Respiratory status: spontaneous breathing, nonlabored ventilation, respiratory function stable and patient connected to nasal cannula oxygen Cardiovascular status: stable Postop Assessment: no signs of nausea or vomiting Anesthetic complications: no     Last Vitals:  Vitals:   10/29/16 0915 10/29/16 1000  BP: (!) 168/92 (!) 154/87  Resp: (!) 33 (!) 30  Temp:      Last Pain:  Vitals:   10/29/16 0849  TempSrc: Oral                 Dinia Joynt A

## 2016-10-29 NOTE — Op Note (Addendum)
[facility] Patient Name: [name] Procedure Date: 10/29/2016 10:08 AM MRN: [id number] Date of Birth: [birth year] Attending MD: Barney Drain , MD CSN: [id number] Age: 71 Admit Type: Outpatient Procedure:                Colonoscopy, SCREENING Indications:              Screening for colorectal malignant neoplasm Providers:                Barney Drain, MD, Janeece Riggers, RN, Randa Spike,                            Technician Referring MD:             Modena Nunnery. [place] Medicines:                Propofol per Anesthesia Complications:            No immediate complications. Estimated Blood Loss:     Estimated blood loss: none. Procedure:                Pre-Anesthesia Assessment:                           - Prior to the procedure, a History and Physical                            was performed, and patient medications and                            allergies were reviewed. The patient's tolerance of                            previous anesthesia was also reviewed. The risks                            and benefits of the procedure and the sedation                            options and risks were discussed with the patient.                            All questions were answered, and informed consent                            was obtained. Prior Anticoagulants: The patient has                            taken no previous anticoagulant or antiplatelet                            agents. ASA Grade Assessment: II - A patient with                            mild systemic disease. After reviewing the risks  and benefits, the patient was deemed in                            satisfactory condition to undergo the procedure.                            After obtaining informed consent, the colonoscope                            was passed under direct vision. Throughout the                            procedure, the patient's blood pressure, pulse, and            oxygen saturations were monitored continuously. The                            [id number] scope was introduced through                            the anus and advanced to the the cecum, identified                            by appendiceal orifice and ileocecal valve. The                            ileocecal valve, appendiceal orifice, and rectum                            were photographed. The colonoscopy was somewhat                            difficult due to a tortuous colon. Successful                            completion of the procedure was aided by COLOWRAP.                            The patient tolerated the procedure well. The                            quality of the bowel preparation was excellent. Scope In: 10:37:27 AM Scope Out: 10:52:07 AM Scope Withdrawal Time: 0 hours 11 minutes 51 seconds  Total Procedure Duration: 0 hours 14 minutes 40 seconds  Findings:      A few small and large-mouthed diverticula were found in the sigmoid       colon, descending colon and hepatic flexure.      Internal hemorrhoids were found during retroflexion. The hemorrhoids       were moderate.      The recto-sigmoid colon revealed moderately excessive looping. Impression:               - Diverticulosis in the sigmoid colon, in the  descending colon and at the hepatic flexure.                           - Internal hemorrhoids.                           - There was significant looping of the LEFT colon. Moderate Sedation:      Per Anesthesia Care Recommendation:           - High fiber diet and low fat diet.                           - Continue present medications.                           - Repeat colonoscopy in 10 years for surveillance.                           - Patient has a contact number available for                            emergencies. The signs and symptoms of potential                            delayed complications were discussed  with the                            patient. Return to normal activities tomorrow.                            Written discharge instructions were provided to the                            patient. Procedure Code(s):        --- Professional ---                           [phone], Colonoscopy, flexible; diagnostic, including                            collection of specimen(s) by brushing or washing,                            when performed (separate procedure) Diagnosis Code(s):        --- Professional ---                           Z12.11, Encounter for screening for malignant                            neoplasm of colon                           K64.8, Other hemorrhoids                           K57.30, Diverticulosis  of large intestine without                            perforation or abscess without bleeding CPT copyright 2016 [employer]. All rights reserved. The codes documented in this report are preliminary and upon coder review may  be revised to meet current compliance requirements. Barney Drain, MD Barney Drain, MD 10/29/2016 10:58:02 AM This report has been signed electronically. Number of Addenda: 0

## 2016-10-29 NOTE — Transfer of Care (Signed)
Immediate Anesthesia Transfer of Care Note  Patient: NYOMIE EHRLICH  Procedure(s) Performed: Procedure(s) with comments: COLONOSCOPY WITH PROPOFOL (N/A) - 10:00 am ESOPHAGOGASTRODUODENOSCOPY (EGD) WITH PROPOFOL (N/A) SAVORY DILATION (N/A)  Patient Location: PACU  Anesthesia Type:MAC  Level of Consciousness: awake, oriented and patient cooperative  Airway & Oxygen Therapy: Patient Spontanous Breathing and Patient connected to nasal cannula oxygen  Post-op Assessment: Report given to RN and Post -op Vital signs reviewed and stable  Post vital signs: Reviewed and stable  Last Vitals:  Vitals:   10/29/16 0915 10/29/16 1000  BP: (!) 168/92 (!) 154/87  Resp: (!) 33 (!) 30  Temp:      Last Pain:  Vitals:   10/29/16 0849  TempSrc: Oral      Patients Stated Pain Goal: 8 (76/81/15 7262)  Complications: No apparent anesthesia complications

## 2016-10-29 NOTE — Anesthesia Preprocedure Evaluation (Signed)
Anesthesia Evaluation  Patient identified by MRN, date of birth, ID band  Reviewed: Allergy & Precautions, H&P , NPO status , Patient's Chart, lab work & pertinent test results, reviewed documented beta blocker date and time   History of Anesthesia Complications (+) PONV, PROLONGED EMERGENCE and history of anesthetic complications  Airway Mallampati: III  TM Distance: >3 FB Neck ROM: Full    Dental no notable dental hx. (+) Edentulous Upper, Dental Advisory Given   Pulmonary shortness of breath and lying,    Pulmonary exam normal breath sounds clear to auscultation       Cardiovascular hypertension, On Home Beta Blockers + dysrhythmias + Valvular Problems/Murmurs  Rhythm:Regular Rate:Normal     Neuro/Psych PSYCHIATRIC DISORDERS Anxiety  Neuromuscular disease (sciatica) negative neurological ROS     GI/Hepatic Neg liver ROS, GERD  Poorly Controlled,  Endo/Other  diabetes, Type 2, Oral Hypoglycemic Agents  Renal/GU Renal disease  negative genitourinary   Musculoskeletal   Abdominal   Peds  Hematology negative hematology ROS (+) anemia ,   Anesthesia Other Findings   Reproductive/Obstetrics negative OB ROS                             Anesthesia Physical Anesthesia Plan  ASA: III  Anesthesia Plan: MAC   Post-op Pain Management:    Induction: Intravenous  Airway Management Planned: Simple Face Mask  Additional Equipment:   Intra-op Plan:   Post-operative Plan:   Informed Consent: I have reviewed the patients History and Physical, chart, labs and discussed the procedure including the risks, benefits and alternatives for the proposed anesthesia with the patient or authorized representative who has indicated his/her understanding and acceptance.     Plan Discussed with:   Anesthesia Plan Comments:         Anesthesia Quick Evaluation

## 2016-10-29 NOTE — H&P (Signed)
Primary Care Physician:  [name], MD Primary Gastroenterologist:  Dr. Oneida Alar  Pre-Procedure History & Physical: HPI:  [name] is a 71 y.o. female here for DYSPHAGIA/screening.  Past Medical History:  Diagnosis Date  . Abdominal hernia 02/26/12   unrepaired  . 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 Anemia   . Anxiety   . Blood transfusion 1980's  . Complication of anesthesia    has a hard time waking up; can't lay flat  . Degenerative disc disease, lumbar    pressing on L3 and L4  . Diabetes mellitus ([place]) 1989   Type 2 NIDDM; "cancer treatment gave me diabetes"  . DJD (degenerative joint disease) of lumbar spine   . Dysrhythmia    "irregular"  . Exertional dyspnea   . GERD (gastroesophageal reflux disease)    mann  . Heart murmur    "think I outgrew it"  . History of kidney stones   . History of stomach ulcers   . Hx: UTI (urinary tract infection)   . Hyperlipidemia   . Hypersensitivity    in tongue  . Hypertension   . IBS (irritable bowel syndrome)   . Insomnia   . Kidney cysts    RT KIDNEY  . Kidney stones 2009   s/p lithotripsy  . Obesity   . PONV (postoperative nausea and vomiting)   . Shortness of breath    unable to lie flat    Past Surgical History:  Procedure Laterality Date  . ABDOMINAL HYSTERECTOMY  2002  . APPENDECTOMY    . BACK SURGERY    . BREAST BIOPSY     right  . CATARACT EXTRACTION W/ INTRAOCULAR LENS  IMPLANT, BILATERAL  2009-2010  . CESAREAN SECTION  1973; 1978; 1981  . CYSTOSCOPY Left 04/13/2014   Procedure: CYSTOSCOPY FLEXIBLE;  Surgeon: Ardis Hughs, MD;  Location: [facility];  Service: Urology;  Laterality: Left;  with STENT  . EYE SURGERY    . implant and screws put in the right lower jaw  11/2008   dental surgery  . kidney blockage  ?1990's   " major surgery ;put kidney on pump for awhile"  . LITHOTRIPSY     "several times"  . LUMBAR FUSION  08/2010  . MASTECTOMY  1997   right  . NEPHROLITHOTOMY Left 04/13/2014   Procedure: LEFT PERCUTANEOUS NEPHROLITHOTOMY WITH SURGEON ACCESS;  Surgeon: Ardis Hughs, MD;  Location: [facility];  Service: Urology;  Laterality: Left;  . OVARIAN CYST SURGERY    . PARATHYROIDECTOMY  02/26/2012   Procedure: PARATHYROIDECTOMY;  Surgeon: Ascencion Dike, MD;  Location: [facility];  Service: ENT;;  . [place]  2011  . urological surgery for blocked ureter secondary to kidney stone      Prior to Admission medications   Medication Sig Start Date End Date Taking? Authorizing Provider  acetaminophen (TYLENOL) 325 MG tablet Take 325 mg by mouth every 6 (six) hours as needed for mild pain.    Yes Historical Provider, MD  BYSTOLIC 20 MG TABS TAKE 1 TABLET BY MOUTH TWICE DAILY. 07/02/16  Yes Alycia Rossetti, MD  Cholecalciferol (VITAMIN D3 PO) Take 1 tablet by mouth daily.   Yes Historical Provider, MD  CINNAMON PO Take 1 capsule by mouth daily.   Yes Historical Provider, MD  dexlansoprazole (DEXILANT) 60 MG capsule Take 1 capsule (60 mg total) by mouth daily. Patient taking differently: Take 60 mg  by mouth daily as needed.  07/03/16  Yes Alycia Rossetti, MD  docusate sodium 100 MG CAPS Take 100 mg by mouth 2 (two) times daily. Patient taking differently: Take 100 mg by mouth daily as needed (constipation).  04/14/14  Yes Ardis Hughs, MD  glimepiride (AMARYL) 2 MG tablet TAKE 2 TABLETS BY MOUTH EVERY MORNING AND 1 TABLET AT BEDTIME. Patient taking differently: Take 2-4 mg by mouth See admin instructions. TAKE 2 TABLETS BY MOUTH EVERY MORNING AND 1 TABLET AT BEDTIME. 07/03/16  Yes Alycia Rossetti, MD  losartan (COZAAR) 25 MG tablet Take 1 tablet (25 mg total) by mouth every morning. Patient taking differently: Take 25 mg by mouth daily.  07/03/16  Yes Alycia Rossetti, MD  metFORMIN (GLUCOPHAGE-XR) 500 MG 24 hr tablet TAKE 2 TABLETS BY MOUTH DAILY. 07/02/16  Yes Alycia Rossetti, MD  Multiple Vitamin (MULITIVITAMIN WITH MINERALS) TABS  Take 1 tablet by mouth daily.   Yes Historical Provider, MD  tetrahydrozoline-zinc (VISINE-AC) 0.05-0.25 % ophthalmic solution Place 2 drops into both eyes 3 (three) times daily as needed (dry eyes).   Yes Historical Provider, MD  LORazepam (ATIVAN) 0.5 MG tablet Take 1 tablet (0.5 mg total) by mouth at bedtime as needed for anxiety. Patient not taking: Reported on 10/17/2016 04/26/15   Alycia Rossetti, MD    Allergies as of 09/30/2016 - Review Complete 09/30/2016  Allergen Reaction Noted  . Demerol [meperidine] Other (See Comments) 02/10/2012  . Invokana [canagliflozin] Hives 10/26/2014  . Metformin and related Diarrhea 10/26/2014  . Statins Other (See Comments) 08/22/2010  . Ace inhibitors Cough 02/09/2008    Family History  Problem Relation Age of Onset  . Heart attack Mother   . Cancer Brother     Esophageal; smoker; deceased 1s  . Diabetes Daughter   . Leukemia Paternal Aunt     deceased 71s  . Pancreatic cancer Paternal Aunt     deceased 84  . Cancer Paternal Aunt     GI cancer; deceased 17s  . Cancer Cousin     kidney cancer; deceased 36; pat first cousin; daughter of aunt w/ leukemia  . Cancer Cousin     colon cancer @ 91; pat first cousin; son of aunt with GI cancer  . Anesthesia problems Neg Hx     Social History   Social History  . Marital status: Married    Spouse name: N/A  . Number of children: 3  . Years of education: N/A   Occupational History  . retired in [employer]  . Smoking status: Never Smoker  . Smokeless tobacco: Never Used  . Alcohol use No     Comment: X2 DRINKS PER YEAR  . Drug use: No  . Sexual activity: Not Currently    Birth control/ protection: Surgical   Other Topics Concern  . Not on file   Social History Narrative  . No narrative on file    Review of Systems: See HPI, otherwise negative ROS   Physical Exam: BP (!) 168/92   Temp 98 F (36.7 C) (Oral)   Resp (!) 33   SpO2 100%  General:    Alert,  pleasant and cooperative in NAD Head:  Normocephalic and atraumatic. Neck:  Supple; Lungs:  Clear throughout to auscultation.    Heart:  Regular rate and rhythm. Abdomen:  Soft, nontender and nondistended. Normal bowel sounds, without guarding, and without rebound.   Neurologic:  Alert and  oriented x4;  grossly normal neurologically.  Impression/Plan:     DYSPHAGIA/screening  PLAN:  EGD/DIL/tcs TODAY. DISCUSSED PROCEDURE, BENEFITS, & RISKS: < 1% chance of medication reaction, bleeding, perforation, or rupture of spleen/liver.

## 2016-10-29 NOTE — Op Note (Signed)
[facility] Patient Name: [name] Procedure Date: 10/29/2016 10:54 AM MRN: [id number] Date of Birth: 06-26-1946 Attending MD: Barney Drain , MD CSN: [id number] Age: 71 Admit Type: Outpatient Procedure:                Upper GI endoscopy WITH COLD FORCEPS                            BIOPSY/ESOPHAGEAL DILATION Indications:              Epigastric abdominal pain, Dysphagia, Heartburn Providers:                Barney Drain, MD, Janeece Riggers, RN, Randa Spike,                            Technician Referring MD:             Modena Nunnery. [place] Medicines:                Propofol per Anesthesia Complications:            No immediate complications. Estimated Blood Loss:     Estimated blood loss was minimal. Procedure:                Pre-Anesthesia Assessment:                           - Prior to the procedure, a History and Physical                            was performed, and patient medications and                            allergies were reviewed. The patient's tolerance of                            previous anesthesia was also reviewed. The risks                            and benefits of the procedure and the sedation                            options and risks were discussed with the patient.                            All questions were answered, and informed consent                            was obtained. Prior Anticoagulants: The patient has                            taken no previous anticoagulant or antiplatelet                            agents. ASA Grade Assessment: II - A patient with  mild systemic disease. After reviewing the risks                            and benefits, the patient was deemed in                            satisfactory condition to undergo the procedure.                            After obtaining informed consent, the endoscope was                            passed under direct vision. Throughout the         procedure, the patient's blood pressure, pulse, and                            oxygen saturations were monitored continuously. The                            [id number] scope was introduced through the                            mouth, and advanced to the second part of duodenum.                            The upper GI endoscopy was accomplished without                            difficulty. The patient tolerated the procedure                            well. Scope In: 10:59:18 AM Scope Out: 11:08:31 AM Total Procedure Duration: 0 hours 9 minutes 13 seconds  Findings:      One moderate (circumferential scarring or stenosis; an endoscope may       pass) benign-appearing, intrinsic stenosis was found. And was traversed.       A guidewire was placed and the scope was withdrawn. Dilation was       performed with a Savary dilator with mild resistance at 14 mm, 15 mm, 16       mm and 17 mm.      Patchy mild inflammation characterized by congestion (edema), erythema       and granularity was found in the gastric antrum. Biopsies were taken       with a cold forceps for Helicobacter pylori testing.      The examined duodenum was normal. 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 Moderate Sedation:      Per Anesthesia Care Recommendation:           - High fiber diet.                           -  Await pathology results.                           - Return to my office in 4 months.                           - Patient has a contact number available for                            emergencies. The signs and symptoms of potential                            delayed complications were discussed with the                            patient. Return to normal activities tomorrow.                            Written discharge instructions were provided to the                            patient.                           -  Continue present medications. Procedure Code(s):        --- Professional ---                           [phone], Esophagogastroduodenoscopy, flexible,                            transoral; with insertion of guide wire followed by                            passage of dilator(s) through esophagus over guide                            wire                           [id number], Esophagogastroduodenoscopy, flexible,                            transoral; with biopsy, single or multiple Diagnosis Code(s):        --- Professional ---                           K22.2, Esophageal obstruction                           K29.70, Gastritis, unspecified, without bleeding                           R10.13, Epigastric pain                           R13.10, Dysphagia, unspecified  R12, Heartburn CPT copyright 2016 American Medical Association. All rights reserved. The codes documented in this report are preliminary and upon coder review may  be revised to meet current compliance requirements. Barney Drain, MD Barney Drain, MD 10/29/2016 11:20:34 AM This report has been signed electronically. Number of Addenda: 0

## 2016-10-29 NOTE — Discharge Instructions (Signed)
YOU DID NOT HAVE ANY POLYPS. YOU HAVE DIVERTICULOSIS IN YOUR LEFT COLON. 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  YOU DID NOT HAVE ANY POLYPS. YOUR PROBLEM SWALLOWING IS DUE TO ACID REFLUX, and THE STRICTURE, WHICH IS ALSO DUE TO ACID REFLUX. YOU HAVE MILD CASE OF GASTRITIS.  I BIOPSIED YOUR STOMACH.     CONTINUE YOUR WEIGHT LOSS EFFORTS. LOSE 10 POUNDS.  DRINK WATER TO KEEP YOUR URINE LIGHT YELLOW.  FOLLOW A HIGH FIBER/LOW FAT DIET. AVOID ITEMS THAT CAUSE BLOATING. SEE INFO BELOW.  CONTINUE DEXILANT  YOUR BIOPSY RESULTS WILL BE AVAILABLE IN MY CHART AFTER FEB 9 AND MY OFFICE WILL CONTACT YOU IN 10-14 DAYS WITH YOUR RESULTS.   Next colonoscopy in 10 years IF THE BENEFITS OUTWEIGH THE RISKS.    ENDOSCOPY Care After Read the instructions outlined below and refer to this sheet in the next week. These discharge instructions provide you with general information on caring for yourself after you leave the hospital. While your treatment has been planned according to the most current medical practices available, unavoidable complications occasionally occur. If you have any problems or questions after discharge, call DR. Shamyra Farias, [phone].  ACTIVITY  You may resume your regular activity, but move at a slower pace for the next 24 hours.   Take frequent rest periods for the next 24 hours.   Walking will help get rid of the air and reduce the bloated feeling in your belly (abdomen).   No driving for 24 hours (because of the medicine (anesthesia) used during the test).   You may shower.   Do not sign any important legal documents or operate any machinery for 24 hours (because of the anesthesia used during the test).    NUTRITION  Drink plenty of fluids.   You may resume your normal diet as instructed by your doctor.   Begin with a light meal and progress to your normal diet. Heavy or fried foods are harder to digest and may make you feel sick to your stomach  (nauseated).   Avoid alcoholic beverages for 24 hours or as instructed.    MEDICATIONS  You may resume your normal medications.   WHAT YOU CAN EXPECT TODAY  Some feelings of bloating in the abdomen.   Passage of more gas than usual.   Spotting of blood in your stool or on the toilet paper  .  IF YOU HAD POLYPS REMOVED DURING THE ENDOSCOPY:  Eat a soft diet IF YOU HAVE NAUSEA, BLOATING, ABDOMINAL PAIN, OR VOMITING.    FINDING OUT THE RESULTS OF YOUR TEST Not all test results are available during your visit. DR. Oneida Alar WILL CALL YOU WITHIN 14 DAYS OF YOUR PROCEDUE WITH YOUR RESULTS. Do not assume everything is normal if you have not heard from DR. Haylin Camilli, CALL HER OFFICE AT [phone].  SEEK IMMEDIATE MEDICAL ATTENTION AND CALL THE OFFICE: [phone] IF:  You have more than a spotting of blood in your stool.   Your belly is swollen (abdominal distention).   You are nauseated or vomiting.   You have a temperature over 101F.   You have abdominal pain or discomfort that is severe or gets worse throughout the day.   Low-Fat Diet BREADS, CEREALS, PASTA, RICE, DRIED PEAS, AND BEANS These products are high in carbohydrates and most are low in fat. Therefore, they can be increased in the diet as substitutes for fatty foods. They too, however, contain calories and should not be eaten in excess. Cereals can  be eaten for snacks as well as for breakfast.   FRUITS AND VEGETABLES It is good to eat fruits and vegetables. Besides being sources of fiber, both are rich in vitamins and some minerals. They help you get the daily allowances of these nutrients. Fruits and vegetables can be used for snacks and desserts.  MEATS Limit lean meat, chicken, [place], and fish to no more than 6 ounces per day. Beef, Pork, and Lamb Use lean cuts of beef, pork, and lamb. Lean cuts include:  Extra-lean ground beef.  Arm roast.  Sirloin tip.  Center-cut ham.  Round steak.  Loin chops.  Rump  roast.  Tenderloin.  Trim all fat off the outside of meats before cooking. It is not necessary to severely decrease the intake of red meat, but lean choices should be made. Lean meat is rich in protein and contains a highly absorbable form of iron. Premenopausal women, in particular, should avoid reducing lean red meat because this could increase the risk for low red blood cells (iron-deficiency anemia).  Chicken and [place] These are good sources of protein. The fat of poultry can be reduced by removing the skin and underlying fat layers before cooking. Chicken and [place] can be substituted for lean red meat in the diet. Poultry should not be fried or covered with high-fat sauces. Fish and Shellfish Fish is a good source of protein. Shellfish contain cholesterol, but they usually are low in saturated fatty acids. The preparation of fish is important. Like chicken and [place], they should not be fried or covered with high-fat sauces. EGGS Egg whites contain no fat or cholesterol. They can be eaten often. Try 1 to 2 egg whites instead of whole eggs in recipes or use egg substitutes that do not contain yolk. MILK AND DAIRY PRODUCTS Use skim or 1% milk instead of 2% or whole milk. Decrease whole milk, natural, and processed cheeses. Use nonfat or low-fat (2%) cottage cheese or low-fat cheeses made from vegetable oils. Choose nonfat or low-fat (1 to 2%) yogurt. Experiment with evaporated skim milk in recipes that call for heavy cream. Substitute low-fat yogurt or low-fat cottage cheese for sour cream in dips and salad dressings. Have at least 2 servings of low-fat dairy products, such as 2 glasses of skim (or 1%) milk each day to help get your daily calcium intake. FATS AND OILS Reduce the total intake of fats, especially saturated fat. Butterfat, lard, and beef fats are high in saturated fat and cholesterol. These should be avoided as much as possible. Vegetable fats do not contain cholesterol, but certain  vegetable fats, such as coconut oil, palm oil, and palm kernel oil are very high in saturated fats. These should be limited. These fats are often used in bakery goods, processed foods, popcorn, oils, and nondairy creamers. Vegetable shortenings and some peanut butters contain hydrogenated oils, which are also saturated fats. Read the labels on these foods and check for saturated vegetable oils. Unsaturated vegetable oils and fats do not raise blood cholesterol. However, they should be limited because they are fats and are high in calories. Total fat should still be limited to 30% of your daily caloric intake. Desirable liquid vegetable oils are corn oil, cottonseed oil, olive oil, canola oil, safflower oil, soybean oil, and sunflower oil. Peanut oil is not as good, but small amounts are acceptable. Buy a heart-healthy tub margarine that has no partially hydrogenated oils in the ingredients. Mayonnaise and salad dressings often are made from unsaturated fats, but they  should also be limited because of their high calorie and fat content. Seeds, nuts, peanut butter, olives, and avocados are high in fat, but the fat is mainly the unsaturated type. These foods should be limited mainly to avoid excess calories and fat. OTHER EATING TIPS Snacks  Most sweets should be limited as snacks. They tend to be rich in calories and fats, and their caloric content outweighs their nutritional value. Some good choices in snacks are graham crackers, melba toast, soda crackers, bagels (no egg), English muffins, fruits, and vegetables. These snacks are preferable to snack crackers, [place] fries, TORTILLA CHIPS, and POTATO chips. Popcorn should be air-popped or cooked in small amounts of liquid vegetable oil. Desserts Eat fruit, low-fat yogurt, and fruit ices instead of pastries, cake, and cookies. Sherbet, angel food cake, gelatin dessert, frozen low-fat yogurt, or other frozen products that do not contain saturated fat (pure fruit  juice bars, frozen ice pops) are also acceptable.  COOKING METHODS Choose those methods that use little or no fat. They include: Poaching.  Braising.  Steaming.  Grilling.  Baking.  Stir-frying.  Broiling.  Microwaving.  Foods can be cooked in a nonstick pan without added fat, or use a nonfat cooking spray in regular cookware. Limit fried foods and avoid frying in saturated fat. Add moisture to lean meats by using water, broth, cooking wines, and other nonfat or low-fat sauces along with the cooking methods mentioned above. Soups and stews should be chilled after cooking. The fat that forms on top after a few hours in the refrigerator should be skimmed off. When preparing meals, avoid using excess salt. Salt can contribute to raising blood pressure in some people.  EATING AWAY FROM HOME Order entres, potatoes, and vegetables without sauces or butter. When meat exceeds the size of a deck of cards (3 to 4 ounces), the rest can be taken home for another meal. Choose vegetable or fruit salads and ask for low-calorie salad dressings to be served on the side. Use dressings sparingly. Limit high-fat toppings, such as bacon, crumbled eggs, cheese, sunflower seeds, and olives. Ask for heart-healthy tub margarine instead of butter.  High-Fiber Diet A high-fiber diet changes your normal diet to include more whole grains, legumes, fruits, and vegetables. Changes in the diet involve replacing refined carbohydrates with unrefined foods. The calorie level of the diet is essentially unchanged. The Dietary Reference Intake (recommended amount) for adult males is 38 grams per day. For adult females, it is 25 grams per day. Pregnant and lactating women should consume 28 grams of fiber per day. Fiber is the intact part of a plant that is not broken down during digestion. Functional fiber is fiber that has been isolated from the plant to provide a beneficial effect in the body. PURPOSE  Increase stool bulk.    Ease and regulate bowel movements.   Lower cholesterol.   REDUCE RISK OF COLON CANCER  INDICATIONS THAT YOU NEED MORE FIBER  Constipation and hemorrhoids.   Uncomplicated diverticulosis (intestine condition) and irritable bowel syndrome.   Weight management.   As a protective measure against hardening of the arteries (atherosclerosis), diabetes, and cancer.   GUIDELINES FOR INCREASING FIBER IN THE DIET  Start adding fiber to the diet slowly. A gradual increase of about 5 more grams (2 slices of whole-wheat bread, 2 servings of most fruits or vegetables, or 1 bowl of high-fiber cereal) per day is best. Too rapid an increase in fiber may result in constipation, flatulence, and bloating.  Drink enough water and fluids to keep your urine clear or pale yellow. Water, juice, or caffeine-free drinks are recommended. Not drinking enough fluid may cause constipation.   Eat a variety of high-fiber foods rather than one type of fiber.   Try to increase your intake of fiber through using high-fiber foods rather than fiber pills or supplements that contain small amounts of fiber.   The goal is to change the types of food eaten. Do not supplement your present diet with high-fiber foods, but replace foods in your present diet.   INCLUDE A VARIETY OF FIBER SOURCES  Replace refined and processed grains with whole grains, canned fruits with fresh fruits, and incorporate other fiber sources. White rice, white breads, and most bakery goods contain little or no fiber.   Brown whole-grain rice, buckwheat oats, and many fruits and vegetables are all good sources of fiber. These include: broccoli, Brussels sprouts, cabbage, cauliflower, beets, sweet potatoes, white potatoes (skin on), carrots, tomatoes, eggplant, squash, berries, fresh fruits, and dried fruits.   Cereals appear to be the richest source of fiber. Cereal fiber is found in whole grains and bran. Bran is the fiber-rich outer coat of  cereal grain, which is largely removed in refining. In whole-grain cereals, the bran remains. In breakfast cereals, the largest amount of fiber is found in those with "bran" in their names. The fiber content is sometimes indicated on the label.   You may need to include additional fruits and vegetables each day.   In baking, for 1 cup white flour, you may use the following substitutions:   1 cup whole-wheat flour minus 2 tablespoons.   1/2 cup white flour plus 1/2 cup whole-wheat flour.   GERD   SYMPTOMS Common symptoms of GERD are heartburn (burning in your chest). This is worse when lying down or bending over. It may also cause belching and indigestion. Some of the things which make GERD worse are:  Increased weight pushes on stomach making acid rise more easily.   Smoking markedly increases acid production.   Alcohol decreases lower esophageal sphincter pressure (valve between stomach and esophagus), allowing acid from stomach into esophagus.   Late evening meals and going to bed with a full stomach increases pressure.   HOME CARE INSTRUCTIONS  Try to achieve and maintain an ideal body weight.   Avoid drinking alcoholic beverages.   Stop smoking.   Put the head of your bed on 4 to 6 inch blocks. This will keep your head and esophagus higher than your stomach. If you cannot use blocks, sleep with several pillows under your head and shoulders.   MINIMIZE THE USE OF aspirin, ibuprofen (Advil or Motrin), or other nonsteroidal anti-inflammatory drugs.   Do not wear tight clothing around your chest or stomach.   Eat smaller meals and eat more frequently. This keeps your stomach from getting too full. Eat slowly.   Do not lie down for 2 or 3 hours after eating. Do not eat or drink anything 1 to 2 hours before going to bed.   Avoid caffeine beverages (colas, coffee, cocoa, tea), fatty foods, citrus fruits and all other foods and drinks that contain acid and that seem to  increase the problems.   Avoid bending over, especially after eating. Also avoid straining during bowel movements or when urinating or lifting things. Anything that increases the pressure in your belly increases the amount of acid that may be pushed up into your esophagus.   Gastritis  Gastritis is  an inflammation (the body's way of reacting to injury and/or infection) of the stomach. It is often caused by viral or bacterial (germ) infections. It can also be caused BY ASPIRIN, BC/GOODY POWDER'S, (IBUPROFEN) MOTRIN, OR ALEVE (NAPROXEN), chemicals (including alcohol), SPICY FOODS, and medications. This illness may be associated with generalized malaise (feeling tired, not well), UPPER ABDOMINAL STOMACH cramps, and fever. One common bacterial cause of gastritis is an organism known as H. Pylori. This can be treated with antibiotics.

## 2016-10-29 NOTE — Anesthesia Procedure Notes (Signed)
Procedure Name: MAC Date/Time: 10/29/2016 10:22 AM Performed by: Andree Elk, AMY A Pre-anesthesia Checklist: Patient identified, Emergency Drugs available, Suction available and Patient being monitored Oxygen Delivery Method: Simple face mask

## 2016-11-03 ENCOUNTER — Telehealth: Payer: Self-pay | Admitting: Gastroenterology

## 2016-11-03 NOTE — Telephone Encounter (Signed)
Please call pt. HER stomach Bx shows mild gastritis.    CONTINUE YOUR WEIGHT LOSS EFFORTS. LOSE 10 POUNDS.  DRINK WATER TO KEEP YOUR URINE LIGHT YELLOW.  FOLLOW A HIGH FIBER/LOW FAT DIET. AVOID ITEMS THAT CAUSE BLOATING.   CONTINUE DEXILANT  FOLLOW UP IN 4 MOS E30 DYSPHAGIA/GERD.  Next colonoscopy in 10 years IF THE BENEFITS OUTWEIGH THE RISKS.

## 2016-11-04 ENCOUNTER — Encounter: Payer: Self-pay | Admitting: Gastroenterology

## 2016-11-04 NOTE — Telephone Encounter (Signed)
LMOM to call.

## 2016-11-04 NOTE — Telephone Encounter (Signed)
APPT MADE AND LETTER SENT, ON RECALL FOR TCS °

## 2016-11-07 ENCOUNTER — Encounter (HOSPITAL_COMMUNITY): Payer: Self-pay | Admitting: Gastroenterology

## 2016-11-07 NOTE — Telephone Encounter (Signed)
Pt is aware of results. 

## 2016-11-18 ENCOUNTER — Encounter (HOSPITAL_COMMUNITY): Payer: Self-pay | Admitting: Adult Health

## 2016-11-18 ENCOUNTER — Encounter (HOSPITAL_COMMUNITY): Payer: Medicare Other | Attending: Adult Health | Admitting: Adult Health

## 2016-11-18 ENCOUNTER — Ambulatory Visit (HOSPITAL_COMMUNITY): Payer: Medicare Other | Admitting: Hematology & Oncology

## 2016-11-18 ENCOUNTER — Encounter (HOSPITAL_COMMUNITY): Payer: Medicare Other

## 2016-11-18 VITALS — BP 136/66 | HR 78 | Temp 98.2°F | Resp 16 | Ht 66.5 in | Wt 194.5 lb

## 2016-11-18 DIAGNOSIS — Z9011 Acquired absence of right breast and nipple: Secondary | ICD-10-CM | POA: Insufficient documentation

## 2016-11-18 DIAGNOSIS — I10 Essential (primary) hypertension: Secondary | ICD-10-CM | POA: Insufficient documentation

## 2016-11-18 DIAGNOSIS — Z79899 Other long term (current) drug therapy: Secondary | ICD-10-CM | POA: Insufficient documentation

## 2016-11-18 DIAGNOSIS — R5383 Other fatigue: Secondary | ICD-10-CM

## 2016-11-18 DIAGNOSIS — N644 Mastodynia: Secondary | ICD-10-CM

## 2016-11-18 DIAGNOSIS — M8580 Other specified disorders of bone density and structure, unspecified site: Secondary | ICD-10-CM

## 2016-11-18 DIAGNOSIS — Z90710 Acquired absence of both cervix and uterus: Secondary | ICD-10-CM | POA: Insufficient documentation

## 2016-11-18 DIAGNOSIS — Z9221 Personal history of antineoplastic chemotherapy: Secondary | ICD-10-CM | POA: Diagnosis not present

## 2016-11-18 DIAGNOSIS — Z853 Personal history of malignant neoplasm of breast: Secondary | ICD-10-CM

## 2016-11-18 DIAGNOSIS — Z7984 Long term (current) use of oral hypoglycemic drugs: Secondary | ICD-10-CM | POA: Insufficient documentation

## 2016-11-18 DIAGNOSIS — E119 Type 2 diabetes mellitus without complications: Secondary | ICD-10-CM | POA: Insufficient documentation

## 2016-11-18 DIAGNOSIS — Z9889 Other specified postprocedural states: Secondary | ICD-10-CM | POA: Insufficient documentation

## 2016-11-18 LAB — CBC WITH DIFFERENTIAL/PLATELET
BASOS ABS: 0 10*3/uL (ref 0.0–0.1)
BASOS PCT: 1 %
EOS ABS: 0.2 10*3/uL (ref 0.0–0.7)
Eosinophils Relative: 2 %
HCT: 35.9 % — ABNORMAL LOW (ref 36.0–46.0)
Hemoglobin: 11.8 g/dL — ABNORMAL LOW (ref 12.0–15.0)
Lymphocytes Relative: 37 %
Lymphs Abs: 3.2 10*3/uL (ref 0.7–4.0)
MCH: 26.3 pg (ref 26.0–34.0)
MCHC: 32.9 g/dL (ref 30.0–36.0)
MCV: 80 fL (ref 78.0–100.0)
MONO ABS: 0.6 10*3/uL (ref 0.1–1.0)
Monocytes Relative: 7 %
NEUTROS ABS: 4.6 10*3/uL (ref 1.7–7.7)
Neutrophils Relative %: 53 %
PLATELETS: 250 10*3/uL (ref 150–400)
RBC: 4.49 MIL/uL (ref 3.87–5.11)
RDW: 15.1 % (ref 11.5–15.5)
WBC: 8.6 10*3/uL (ref 4.0–10.5)

## 2016-11-18 LAB — COMPREHENSIVE METABOLIC PANEL
ALBUMIN: 3.9 g/dL (ref 3.5–5.0)
ALT: 23 U/L (ref 14–54)
AST: 26 U/L (ref 15–41)
Alkaline Phosphatase: 48 U/L (ref 38–126)
Anion gap: 7 (ref 5–15)
BILIRUBIN TOTAL: 0.5 mg/dL (ref 0.3–1.2)
BUN: 9 mg/dL (ref 6–20)
CHLORIDE: 104 mmol/L (ref 101–111)
CO2: 26 mmol/L (ref 22–32)
Calcium: 8.9 mg/dL (ref 8.9–10.3)
Creatinine, Ser: 0.91 mg/dL (ref 0.44–1.00)
GFR calc Af Amer: >60 mL/min (ref >60)
GFR calc non Af Amer: >60 mL/min (ref >60)
GLUCOSE: 230 mg/dL — AB (ref 65–99)
POTASSIUM: 3.4 mmol/L — AB (ref 3.5–5.1)
SODIUM: 137 mmol/L (ref 135–145)
Total Protein: 7.8 g/dL (ref 6.5–8.1)

## 2016-11-18 LAB — IRON AND TIBC
IRON: 52 ug/dL (ref 28–170)
Saturation Ratios: 16 % (ref 10.4–31.8)
TIBC: 332 ug/dL (ref 250–450)
UIBC: 280 ug/dL

## 2016-11-18 LAB — FERRITIN: FERRITIN: 89 ng/mL (ref 11–307)

## 2016-11-18 LAB — VITAMIN B12: Vitamin B-12: 347 pg/mL (ref 180–914)

## 2016-11-18 LAB — FOLATE: Folate: 15 ng/mL (ref >5.9)

## 2016-11-18 NOTE — Progress Notes (Signed)
[place], [place] [address]   CLINIC:  Medical Oncology/Hematology  PCP:  [name], MD [address] [place] [address] [phone]   REASON FOR VISIT:  Follow-up for Stage IA (T1cN0M0) right breast cancer, ER+/PR+ (diagnosed in 1997)  CURRENT THERAPY: Observation   BRIEF ONCOLOGIC HISTORY:  -Right breast cancer measuring 1.1 x 1.2 cm, grade 2. s/p right mastectomy with regional axillary lymph node dissection on 10/17/1995; all 8 axillary lymph nodes were negative for metastatic disease. -She went on to have adjuvant chemotherapy consisting of Adriamycin/Cytoxan 4 cycles -Antiestrogen therapy with tamoxifen 5 years, completing therapy in 03/2001.   HISTORY OF PRESENT ILLNESS:  [name] presents for routine follow-up for her history of right breast cancer s/p mastectomy, adjuvant chemo, and 5 years of Tamoxifen therapy. She completed all treatment in 2002.     INTERVAL HISTORY:   She does report that she had the flu in 07/2016 despite getting the flu vaccine.  She experienced a fall during that time and hurt her left shoulder and hit her head; she tells me her PCP evaluated her with this illness and fall. She is continuing to recover; overall feels quite well, with the exception of "feeling tired all the time." Denies any blood in her stools/dark tarry stools, hematuria, dysuria, cough, or shortness of breath.  She is concerned about possible thickness in her left breast, as well as tenderness in the left breast within the past 2 months. She missed her mammogram that was due in 11/2015, because she has been dealing with neck pain and underwent MRI cervical spine; results showed very large C3-4 and moderately large C6-7 disc protrusions with cord compression (of note: no worrisome osseous abnormalities); she was referred to neurosurgery by her PCP.   She is reportedly up-to-date with her vaccinations; underwent colonoscopy on  10/29/16.  Last DEXA scan done 09/28/13 showing osteopenia; she takes vitamin D supplementation; does not take calcium d/t history of kidney stones. She does not exercise regularly. She is working on eating a more healthy diet.    REVIEW OF SYSTEMS:  Review of Systems  Constitutional: Positive for fatigue. Negative for chills and fever.  HENT:  Negative.  Negative for lump/mass and nosebleeds.   Eyes: Negative.   Respiratory: Negative.  Negative for cough and shortness of breath.   Cardiovascular: Negative.  Negative for chest pain and leg swelling.  Gastrointestinal: Negative.  Negative for abdominal pain, blood in stool, constipation, diarrhea, nausea and vomiting.  Endocrine: Negative.   Genitourinary: Negative.  Negative for dysuria, hematuria and vaginal bleeding.   Musculoskeletal: Negative.  Negative for arthralgias.  Skin: Negative.  Negative for rash.  Neurological: Negative.  Negative for dizziness and headaches.  Hematological: Negative.  Negative for adenopathy. Does not bruise/bleed easily.  Psychiatric/Behavioral: Positive for sleep disturbance ("I don't sleep well all of the time." ). Negative for depression. The patient is not nervous/anxious.      PAST MEDICAL/SURGICAL HISTORY:  Past Medical History:  Diagnosis Date  . Abdominal hernia 02/26/12   unrepaired  . 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 Anemia   . Anxiety   . Blood transfusion 1980's  . Complication of anesthesia    has a hard time waking up; can't lay flat  . Degenerative disc disease, lumbar    pressing on L3 and L4  . 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 DJD (degenerative joint disease) of lumbar spine   . Dysrhythmia    "irregular"  . Exertional dyspnea   . GERD (gastroesophageal reflux disease)    mann  . Heart murmur    "think I outgrew it"  . History of kidney stones   . History of stomach ulcers   . Hx:  UTI (urinary tract infection)   . Hyperlipidemia   . Hypersensitivity    in tongue  . Hypertension   . IBS (irritable bowel syndrome)   . Insomnia   . Kidney cysts    RT KIDNEY  . Kidney stones 2009   s/p lithotripsy  . Obesity   . PONV (postoperative nausea and vomiting)   . Shortness of breath    unable to lie flat   Past Surgical History:  Procedure Laterality Date  . ABDOMINAL HYSTERECTOMY  2002  . APPENDECTOMY    . BACK SURGERY    . BREAST BIOPSY     right  . CATARACT EXTRACTION W/ INTRAOCULAR LENS  IMPLANT, BILATERAL  2009-2010  . CESAREAN SECTION  1973; 1978; 1981  . COLONOSCOPY WITH PROPOFOL N/A 10/29/2016   Procedure: COLONOSCOPY WITH PROPOFOL;  Surgeon: Danie Binder, MD;  [facility];  Service: Endoscopy;  Laterality: N/A;  10:00 am  . CYSTOSCOPY Left 04/13/2014   Procedure: CYSTOSCOPY FLEXIBLE;  Surgeon: Ardis Hughs, MD;  Location: [facility];  Service: Urology;  Laterality: Left;  with STENT  . ESOPHAGOGASTRODUODENOSCOPY (EGD) WITH PROPOFOL N/A 10/29/2016   Procedure: ESOPHAGOGASTRODUODENOSCOPY (EGD) WITH PROPOFOL;  Surgeon: Danie Binder, MD;  [facility];  Service: Endoscopy;  Laterality: N/A;  . EYE SURGERY    . implant and screws put in the right lower jaw  11/2008   dental surgery  . kidney blockage  ?1990's   " major surgery ;put kidney on pump for awhile"  . LITHOTRIPSY     "several times"  . LUMBAR FUSION  08/2010  . MASTECTOMY  1997   right  . NEPHROLITHOTOMY Left 04/13/2014   Procedure: LEFT PERCUTANEOUS NEPHROLITHOTOMY WITH SURGEON ACCESS;  Surgeon: Ardis Hughs, MD;  Location: [facility];  Service: Urology;  Laterality: Left;  . OVARIAN CYST SURGERY    . PARATHYROIDECTOMY  02/26/2012   Procedure: PARATHYROIDECTOMY;  Surgeon: Ascencion Dike, MD;  Location: [facility];  Service: ENT;;  . Azzie Almas DILATION N/A 10/29/2016   Procedure: SAVORY DILATION;  Surgeon: Danie Binder, MD;  [facility];  Service: Endoscopy;  Laterality:  N/A;  . [place] SURGERY  2011  . urological surgery for blocked ureter secondary to kidney stone       SOCIAL HISTORY:  Social History   Social History  . Marital status: Married    Spouse name: N/A  . Number of children: 3  . Years of education: N/A   Occupational History  . retired in [place]  . Smoking status: Never Smoker  . Smokeless tobacco: Never Used  . Alcohol use No     Comment: X2 DRINKS PER YEAR  . Drug use: No  . Sexual activity: Not Currently    Birth control/ protection: Surgical   Other Topics Concern  . Not on file   Social History Narrative  . No narrative on file    FAMILY HISTORY:  Family History  Problem Relation Age of Onset  . Heart attack Mother   .  Cancer Brother     Esophageal; smoker; deceased 66s  . Diabetes Daughter   . Leukemia Paternal Aunt     deceased 45s  . Pancreatic cancer Paternal Aunt     deceased 63  . Cancer Paternal Aunt     GI cancer; deceased 44s  . Cancer Cousin     kidney cancer; deceased 37; pat first cousin; daughter of aunt w/ leukemia  . Cancer Cousin     colon cancer @ 106; pat first cousin; son of aunt with GI cancer  . Anesthesia problems Neg Hx     CURRENT MEDICATIONS:  Outpatient Encounter Prescriptions as of 11/18/2016  Medication Sig  . acetaminophen (TYLENOL) 325 MG tablet Take 325 mg by mouth every 6 (six) hours as needed for mild pain.   Marland Kitchen BYSTOLIC 20 MG TABS TAKE 1 TABLET BY MOUTH TWICE DAILY.  Marland Kitchen Cholecalciferol (VITAMIN D3 PO) Take 1 tablet by mouth daily.  Marland Kitchen CINNAMON PO Take 1 capsule by mouth daily.  Marland Kitchen dexlansoprazole (DEXILANT) 60 MG capsule Take 1 capsule (60 mg total) by mouth daily. (Patient taking differently: Take 60 mg by mouth daily as needed. )  . docusate sodium 100 MG CAPS Take 100 mg by mouth 2 (two) times daily. (Patient taking differently: Take 100 mg by mouth daily as needed (constipation). )  . glimepiride (AMARYL) 2 MG tablet TAKE 2 TABLETS BY MOUTH  EVERY MORNING AND 1 TABLET AT BEDTIME. (Patient taking differently: Take 2-4 mg by mouth See admin instructions. TAKE 2 TABLETS BY MOUTH EVERY MORNING AND 1 TABLET AT BEDTIME.)  . LORazepam (ATIVAN) 0.5 MG tablet Take 1 tablet (0.5 mg total) by mouth at bedtime as needed for anxiety.  Marland Kitchen losartan (COZAAR) 25 MG tablet Take 1 tablet (25 mg total) by mouth every morning. (Patient taking differently: Take 25 mg by mouth daily. )  . metFORMIN (GLUCOPHAGE-XR) 500 MG 24 hr tablet TAKE 2 TABLETS BY MOUTH DAILY.  . Multiple Vitamin (MULITIVITAMIN WITH MINERALS) TABS Take 1 tablet by mouth daily.  Marland Kitchen tetrahydrozoline-zinc (VISINE-AC) 0.05-0.25 % ophthalmic solution Place 2 drops into both eyes 3 (three) times daily as needed (dry eyes).   No facility-administered encounter medications on file as of 11/18/2016.     ALLERGIES:  Allergies  Allergen Reactions  . Demerol [Meperidine] Other (See Comments)    Lost consciousness  . Invokana [Canagliflozin] Hives  . Metformin And Related Diarrhea  . Statins Other (See Comments)    myalgia  . Ace Inhibitors Cough     PHYSICAL EXAM:  ECOG Performance status: 1 - Symptomatic, but independent   Vitals:   11/18/16 1212  BP: 136/66  Pulse: 78  Resp: 16  Temp: 98.2 F (36.8 C)   Filed Weights   11/18/16 1212  Weight: 194 lb 8 oz (88.2 kg)    Physical Exam  Constitutional: She is oriented to person, place, and time and well-developed, well-nourished, and in no distress.  HENT:  Head: Normocephalic.  Mouth/Throat: Oropharynx is clear and moist. No oropharyngeal exudate.  Eyes: Conjunctivae are normal. Pupils are equal, round, and reactive to light. No scleral icterus.  Neck: Normal range of motion. Neck supple.  Cardiovascular: Normal rate, regular rhythm and normal heart sounds.   Pulmonary/Chest: Effort normal and breath sounds normal. No respiratory distress. Left breast exhibits no mass and no nipple discharge.    Abdominal: Soft. Bowel  sounds are normal. There is no tenderness.  Musculoskeletal: Normal range of motion. She exhibits no edema.  Lymphadenopathy:  She has no cervical adenopathy.       Right: No supraclavicular adenopathy present.       Left: No supraclavicular adenopathy present.  Neurological: She is alert and oriented to person, place, and time. No cranial nerve deficit. Gait normal.  Skin: Skin is warm and dry. No rash noted.  Psychiatric: Mood, memory, affect and judgment normal.  Nursing note and vitals reviewed.    LABORATORY DATA:  I have reviewed the labs as listed.  CBC    Component Value Date/Time   WBC 8.6 11/18/2016 1242   RBC 4.49 11/18/2016 1242   HGB 11.8 (L) 11/18/2016 1242   HCT 35.9 (L) 11/18/2016 1242   PLT 250 11/18/2016 1242   MCV 80.0 11/18/2016 1242   MCH 26.3 11/18/2016 1242   MCHC 32.9 11/18/2016 1242   RDW 15.1 11/18/2016 1242   LYMPHSABS 3.2 11/18/2016 1242   MONOABS 0.6 11/18/2016 1242   EOSABS 0.2 11/18/2016 1242   BASOSABS 0.0 11/18/2016 1242   CMP Latest Ref Rng & Units 11/18/2016 10/24/2016 08/26/2016  Glucose 65 - 99 mg/dL 230(H) 247(H) 217(H)  BUN 6 - 20 mg/dL 9 11 13   Creatinine 0.44 - 1.00 mg/dL 0.91 0.90 0.96  Sodium 135 - 145 mmol/L 137 136 139  Potassium 3.5 - 5.1 mmol/L 3.4(L) 4.0 4.4  Chloride 101 - 111 mmol/L 104 103 103  CO2 22 - 32 mmol/L 26 24 25   Calcium 8.9 - 10.3 mg/dL 8.9 8.9 9.2  Total Protein 6.5 - 8.1 g/dL 7.8 - -  Total Bilirubin 0.3 - 1.2 mg/dL 0.5 - -  Alkaline Phos 38 - 126 U/L 48 - -  AST 15 - 41 U/L 26 - -  ALT 14 - 54 U/L 23 - -    PENDING LABS:    DIAGNOSTIC IMAGING:  Last left breast screening mammogram: 12/19/14    PATHOLOGY:     ASSESSMENT & PLAN:   Stage IA (T1cN0M) right breast cancer, ER/PR+, diagnosed in 1997:  -s/p mastectomy, followed by adjuvant chemo with Adriamycin/Cytoxan x 4 cycles, then Tamoxifen x 5 years. Completed Tamoxifen therapy in 2002. -She is now 20+ years out from her original breast cancer  diagnosis without evidence of recurrence; this is extremely favorable.  -She did not have screening left breast mammogram for 2017; we will make arrangements for her to get her mammogram here at [facility] soon. I will place orders for diagnostic mammogram given her breast tenderness and concerns.   -She will return to cancer center in 1 year for continued surveillance.   Fatigue:   -Likely secondary to decreased physical activity, sleep disturbance, and continued recovery from the flu.   -Will collect CBC with diff, CMET, anemia panel labs (iron/TIBC, ferritin, B12/folate), as well as vitamin D level today; we will notify her of these results.    Bone health:  -Last DEXA scan was 09/28/13 showed osteopenia; T-score -2.1.  -She is due for biennial DEXA imaging; orders placed today and will try to coordinate with mammogram to be done on same day.  -Recommended she continue vitamin D supplementation and increase weight-bearing exercises.   Health maintenance/Wellness promotion:  -Vaccinations reportedly up-to-date.  -Last colonoscopy 10/29/16 with Dr. Oneida Alar.  -Recommended she continue working on healthy diet and increased exercise.  -Denies any tobacco use; only drinks alcohol on special occasions.        Dispo:  -Labs today.  -Left breast mammogram overdue; orders placed today.  -DEXA scan due; orders placed.  -Return to  cancer center in 1 year for continued surveillance.    All questions were answered to patient's stated satisfaction. Encouraged patient to call with any new concerns or questions before her next visit to the cancer center and we can certain see her sooner, if needed.      Orders placed this encounter:  Orders Placed This Encounter  Procedures  . MM Digital Diagnostic Unilat L  . DG Bone Density  . Vitamin B12  . Folate  . Iron and TIBC  . Ferritin  . CBC with Differential/Platelet  . Comprehensive metabolic panel  . VITAMIN D 25 Hydroxy (Vit-D Deficiency,  Fractures)      [name], NP [facility] [phone]

## 2016-11-18 NOTE — Patient Instructions (Addendum)
[place] at [facility] Discharge Instructions  RECOMMENDATIONS MADE BY THE CONSULTANT AND ANY TEST RESULTS WILL BE SENT TO YOUR REFERRING PHYSICIAN.  [name] at [facility] Discharge Instructions  RECOMMENDATIONS MADE BY THE CONSULTANT AND ANY TEST RESULTS WILL BE SENT TO YOUR REFERRING PHYSICIAN.  You were seen today by [name] NP. Left breast mammogram ordered and dexa scan. Return in 1 year for follow up.    Thank you for choosing [place] at [facility] to provide your oncology and hematology care.  To afford each patient quality time with our provider, please arrive at least 15 minutes before your scheduled appointment time.    If you have a lab appointment with the [facility] please come in thru the  Main Entrance and check in at the main information desk  You need to re-schedule your appointment should you arrive 10 or more minutes late.  We strive to give you quality time with our providers, and arriving late affects you and other patients whose appointments are after yours.  Also, if you no show three or more times for appointments you may be dismissed from the clinic at the providers discretion.     Again, thank you for choosing [facility].  Our hope is that these requests will decrease the amount of time that you wait before being seen by our physicians.       _____________________________________________________________  Should you have questions after your visit to [facility], please contact our office at [phone] between the hours of 8:30 a.m. and 4:30 p.m.  Voicemails left after 4:30 p.m. will not be returned until the following business day.  For prescription refill requests, have your pharmacy contact our office.       Resources For Cancer Patients and their Caregivers ? American Cancer Society: Can assist with transportation, wigs, general needs,  runs Look Good Feel Better.        (539)444-9586 ? Cancer Care: Provides financial assistance, online support groups, medication/co-pay assistance.  1-800-813-HOPE 5190183389) ? [place] Assists [place] cancer patients and their families through emotional , educational and financial support.  510-674-2194 ? [place] DSS Where to apply for food stamps, Medicaid and utility assistance. 217-617-5635 ? [facility]: Transportation to medical appointments. 609-815-6958 ? Social Security Administration: May apply for disability if have a Stage IV cancer. 6511398615 548-711-0677 ? [employer], Disability and Transit Services: Assists with nutrition, care and transit needs. [place] Support Programs: @10RELATIVEDAYS @ > Cancer Support Group  2nd Tuesday of the month 1pm-2pm, Journey Room  > Creative Journey  3rd Tuesday of the month 1130am-1pm, Journey Room  > Look Good Feel Better  1st Wednesday of the month 10am-12 noon, Journey Room (Call [place] to register [phone])     Thank you for choosing [place] at [facility] to provide your oncology and hematology care.  To afford each patient quality time with our provider, please arrive at least 15 minutes before your scheduled appointment time.    If you have a lab appointment with the [facility] please come in thru the  Main Entrance and check in at the main information desk  You need to re-schedule your appointment should you arrive 10 or more minutes late.  We strive to give you quality time with our providers, and arriving late affects you and other patients  whose appointments are after yours.  Also, if you no show three or more times for appointments you may be dismissed from the clinic at the providers discretion.     Again, thank you for choosing [facility].  Our hope is that these requests will decrease the amount  of time that you wait before being seen by our physicians.       _____________________________________________________________  Should you have questions after your visit to [facility], please contact our office at [phone] between the hours of 8:30 a.m. and 4:30 p.m.  Voicemails left after 4:30 p.m. will not be returned until the following business day.  For prescription refill requests, have your pharmacy contact our office.       Resources For Cancer Patients and their Caregivers ? American Cancer Society: Can assist with transportation, wigs, general needs, runs Look Good Feel Better.        ([phone] 518-5857 ? Cancer Care: Provides financial assistance, online support groups, medication/co-pay assistance.  1-800-813-HOPE 973-110-9584) ? [place] Assists [place] cancer patients and their families through emotional , educational and financial support.  (734)712-3741 ? [place] DSS Where to apply for food stamps, Medicaid and utility assistance. (712) 040-5019 ? RCATS: Transportation to medical appointments. 336-033-5780 ? Social Security Administration: May apply for disability if have a Stage IV cancer. 859 780 6042 212 663 6180 ? [employer], Disability and Transit Services: Assists with nutrition, care and transit needs. [place] Support Programs: @10RELATIVEDAYS @ > Cancer Support Group  2nd Tuesday of the month 1pm-2pm, Journey Room  > Creative Journey  3rd Tuesday of the month 1130am-1pm, Journey Room  > Look Good Feel Better  1st Wednesday of the month 10am-12 noon, Journey Room (Call [place] to register [phone])

## 2016-11-19 LAB — VITAMIN D 25 HYDROXY (VIT D DEFICIENCY, FRACTURES): Vit D, 25-Hydroxy: 29.5 ng/mL — ABNORMAL LOW (ref 30.0–100.0)

## 2016-11-21 ENCOUNTER — Other Ambulatory Visit: Payer: Self-pay | Admitting: Adult Health

## 2016-11-21 DIAGNOSIS — N644 Mastodynia: Secondary | ICD-10-CM

## 2016-11-28 ENCOUNTER — Telehealth: Payer: Self-pay | Admitting: Genetic Counselor

## 2016-11-28 ENCOUNTER — Encounter: Payer: Self-pay | Admitting: Genetic Counselor

## 2016-11-28 ENCOUNTER — Encounter (HOSPITAL_COMMUNITY): Payer: Medicare Other | Admitting: Genetic Counselor

## 2016-11-28 ENCOUNTER — Other Ambulatory Visit (HOSPITAL_COMMUNITY): Payer: Medicare Other

## 2016-11-28 DIAGNOSIS — Z1379 Encounter for other screening for genetic and chromosomal anomalies: Secondary | ICD-10-CM | POA: Insufficient documentation

## 2016-11-28 NOTE — Telephone Encounter (Signed)
I called patient to remind her that she had genetic testing in 2015, and was wondering if she had further questions or was possibly referred for something else.  She states that the thought that she had already been tested, but could not remember for sure, and that she would r/s the appointment if her MD thought she needed to be seen again.

## 2016-12-03 ENCOUNTER — Ambulatory Visit (HOSPITAL_COMMUNITY)
Admission: RE | Admit: 2016-12-03 | Discharge: 2016-12-03 | Disposition: A | Discharge status: Home / Self Care | Payer: Medicare Other | Source: Ambulatory Visit | Attending: Adult Health | Admitting: Adult Health

## 2016-12-03 ENCOUNTER — Encounter (HOSPITAL_COMMUNITY): Payer: Medicare Other

## 2016-12-03 DIAGNOSIS — N644 Mastodynia: Secondary | ICD-10-CM | POA: Insufficient documentation

## 2016-12-28 ENCOUNTER — Other Ambulatory Visit: Payer: Self-pay | Admitting: Family Medicine

## 2017-01-27 ENCOUNTER — Other Ambulatory Visit: Payer: Self-pay | Admitting: Family Medicine

## 2017-02-06 ENCOUNTER — Other Ambulatory Visit: Payer: Self-pay | Admitting: *Deleted

## 2017-02-06 MED ORDER — NEBIVOLOL HCL 20 MG PO TABS: 1.0000 | ORAL_TABLET | Freq: Two times a day (BID) | ORAL | 3 refills | Status: DC

## 2017-02-27 ENCOUNTER — Other Ambulatory Visit: Payer: Self-pay | Admitting: Family Medicine

## 2017-03-04 ENCOUNTER — Ambulatory Visit: Payer: Medicare Other | Admitting: Gastroenterology

## 2017-03-05 ENCOUNTER — Ambulatory Visit (INDEPENDENT_AMBULATORY_CARE_PROVIDER_SITE_OTHER): Payer: Medicare Other | Admitting: Podiatry

## 2017-03-05 ENCOUNTER — Encounter: Payer: Self-pay | Admitting: Podiatry

## 2017-03-05 DIAGNOSIS — B351 Tinea unguium: Secondary | ICD-10-CM

## 2017-03-05 DIAGNOSIS — E119 Type 2 diabetes mellitus without complications: Secondary | ICD-10-CM

## 2017-03-05 NOTE — Patient Instructions (Signed)
Two-week history of tick bite on the top of your left foot. Contact your primary care physician today and advise him he had a tick bite  Diabetes and Foot Care Diabetes may cause you to have problems because of poor blood supply (circulation) to your feet and legs. This may cause the skin on your feet to become thinner, break easier, and heal more slowly. Your skin may become dry, and the skin may peel and crack. You may also have nerve damage in your legs and feet causing decreased feeling in them. You may not notice minor injuries to your feet that could lead to infections or more serious problems. Taking care of your feet is one of the most important things you can do for yourself. Follow these instructions at home:  Wear shoes at all times, even in the house. Do not go barefoot. Bare feet are easily injured.  Check your feet daily for blisters, cuts, and redness. If you cannot see the bottom of your feet, use a mirror or ask someone for help.  Wash your feet with warm water (do not use hot water) and mild soap. Then pat your feet and the areas between your toes until they are completely dry. Do not soak your feet as this can dry your skin.  Apply a moisturizing lotion or petroleum jelly (that does not contain alcohol and is unscented) to the skin on your feet and to dry, brittle toenails. Do not apply lotion between your toes.  Trim your toenails straight across. Do not dig under them or around the cuticle. File the edges of your nails with an emery board or nail file.  Do not cut corns or calluses or try to remove them with medicine.  Wear clean socks or stockings every day. Make sure they are not too tight. Do not wear knee-high stockings since they may decrease blood flow to your legs.  Wear shoes that fit properly and have enough cushioning. To break in new shoes, wear them for just a few hours a day. This prevents you from injuring your feet. Always look in your shoes before you put them  on to be sure there are no objects inside.  Do not cross your legs. This may decrease the blood flow to your feet.  If you find a minor scrape, cut, or break in the skin on your feet, keep it and the skin around it clean and dry. These areas may be cleansed with mild soap and water. Do not cleanse the area with peroxide, alcohol, or iodine.  When you remove an adhesive bandage, be sure not to damage the skin around it.  If you have a wound, look at it several times a day to make sure it is healing.  Do not use heating pads or hot water bottles. They may burn your skin. If you have lost feeling in your feet or legs, you may not know it is happening until it is too late.  Make sure your health care provider performs a complete foot exam at least annually or more often if you have foot problems. Report any cuts, sores, or bruises to your health care provider immediately. Contact a health care provider if:  You have an injury that is not healing.  You have cuts or breaks in the skin.  You have an ingrown nail.  You notice redness on your legs or feet.  You feel burning or tingling in your legs or feet.  You have pain or cramps  in your legs and feet.  Your legs or feet are numb.  Your feet always feel cold. Get help right away if:  There is increasing redness, swelling, or pain in or around a wound.  There is a red line that goes up your leg.  Pus is coming from a wound.  You develop a fever or as directed by your health care provider.  You notice a bad smell coming from an ulcer or wound. This information is not intended to replace advice given to you by your health care provider. Make sure you discuss any questions you have with your health care provider. Document Released: 09/06/2000 Document Revised: 02/15/2016 Document Reviewed: 02/16/2013 Elsevier Interactive Patient Education  2017 [employer].

## 2017-03-05 NOTE — Progress Notes (Signed)
   Subjective:    Patient ID: [name], female    DOB: March 19, 1946, 71 y.o.   MRN: [id number]  HPI  Patient presents today requesting diabetic foot examination and trimming of her toenails. Patient denies any history of foot ulceration, amputation or claudication. Patient states that her toenails are somewhat thickened and uncomfortable and difficult to trim. Patient was last evaluated 01/16/2015. Patient also complaining of itching from the suspect tick bite on the dorsal aspect of the left foot which she's been self treating with topical medications. She denies any fever, chills or any other bodily complaints at this time other than localized itching Patient denies history of numbness, burning, tingling  Review of Systems  Skin: Positive for color change.       Objective:   Physical Exam  Orientated 3 DP and PT pulses 2/4 bilaterally Capillary reflex immediate bilaterally Sensation to 10 g monofilament wire intact 5/5 bilaterally Vibratory sensation reactive bilaterally Ankle reflex reactive bilaterally  Dermatological Open skin lesions bilaterally raised circular discolored area dorsal second MPJ with reactive fibrous tissue. The lesions approximate 10 mm in diameter. 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

## 2017-03-06 ENCOUNTER — Telehealth: Payer: Self-pay | Admitting: *Deleted

## 2017-03-06 NOTE — Telephone Encounter (Signed)
Received call from patient.    Reports that tick was removed from person x2 weeks, and requested signs to look out for.   Advised to contact office for appointment if patient: Develops flu-like symptoms including fever, headache, nausea, vomiting, and muscle aches, or a rash within one month after the bite,  Bite area develops a lesion within 30 days,  A "bullseye" rash in which the center becomes clearer as the redness moves outward in a circular pattern,  Signs of infection at the bite site such as redness, warmth, or inflammation.

## 2017-03-10 ENCOUNTER — Encounter (HOSPITAL_COMMUNITY): Payer: Self-pay | Admitting: *Deleted

## 2017-03-10 ENCOUNTER — Ambulatory Visit (HOSPITAL_COMMUNITY)
Admission: EM | Admit: 2017-03-10 | Discharge: 2017-03-10 | Disposition: A | Discharge status: Home / Self Care | Payer: Medicare Other | Attending: Family Medicine | Admitting: Family Medicine

## 2017-03-10 DIAGNOSIS — W57XXXA Bitten or stung by nonvenomous insect and other nonvenomous arthropods, initial encounter: Secondary | ICD-10-CM

## 2017-03-10 DIAGNOSIS — R21 Rash and other nonspecific skin eruption: Secondary | ICD-10-CM | POA: Diagnosis not present

## 2017-03-10 DIAGNOSIS — L03116 Cellulitis of left lower limb: Secondary | ICD-10-CM | POA: Diagnosis not present

## 2017-03-10 MED ORDER — DOXYCYCLINE HYCLATE 100 MG PO TABS: 100.0000 mg | ORAL_TABLET | Freq: Two times a day (BID) | ORAL | 0 refills | Status: DC

## 2017-03-10 NOTE — ED Provider Notes (Signed)
[facility]    CSN: [id number] Arrival date & time: 03/10/17  1217     History   Chief Complaint Chief Complaint  Patient presents with  . Foot Pain    HPI [name] is a 71 y.o. female.   Is a 71 year old woman who comes in with history of tick bite 3 weeks ago. Initially the left foot swelled up but then the swelling went down for 10 days. Over the last 3 days, the foot has started to swell again.      Past Medical History:  Diagnosis Date  . Abdominal hernia 02/26/12   unrepaired  . 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 Anemia   . Anxiety   . Blood transfusion 1980's  . Complication of anesthesia    has a hard time waking up; can't lay flat  . Degenerative disc disease, lumbar    pressing on L3 and L4  . Diabetes mellitus ([place]) 1989   Type 2 NIDDM; "cancer treatment gave me diabetes"  . DJD (degenerative joint disease) of lumbar spine   . Dysrhythmia    "irregular"  . Exertional dyspnea   . GERD (gastroesophageal reflux disease)    mann  . Heart murmur    "think I outgrew it"  . History of kidney stones   . History of stomach ulcers   . Hx: UTI (urinary tract infection)   . Hyperlipidemia   . Hypersensitivity    in tongue  . Hypertension   . IBS (irritable bowel syndrome)   . Insomnia   . Kidney cysts    RT KIDNEY  . Kidney stones 2009   s/p lithotripsy  . Obesity   . PONV (postoperative nausea and vomiting)   . Shortness of breath    unable to lie flat    Patient Active Problem List   Diagnosis Date Noted  . Genetic testing 11/28/2016  . Esophageal dysphagia 09/30/2016  . Abdominal pain, epigastric 09/30/2016  . Encounter for screening colonoscopy 09/30/2016  . Ventral hernia 07/28/2015  . Situational anxiety 04/26/2015  . Right knee injury 01/23/2015  . Thyromegaly 01/23/2015  . Rash and nonspecific skin eruption 01/23/2015  . Iron deficiency 06/17/2014  . Nephrolithiasis  04/13/2014  . Kidney stone 03/18/2014  . DDD (degenerative disc disease), lumbar 01/26/2014  . 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 Renal cyst 01/26/2014  . Back pain with left-sided sciatica 12/28/2013  . Type 2 diabetes mellitus ([place]) 08/01/2013  . FATIGUE 12/26/2009  . GASTROPARESIS 12/15/2008  . Esophageal reflux 06/21/2008  . IBS 06/21/2008  . 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 ([place]) 02/09/2008  . Hyperlipidemia 02/09/2008  . Obesity 02/09/2008  . Essential hypertension 02/09/2008  . Insomnia 02/09/2008    Past Surgical History:  Procedure Laterality Date  . ABDOMINAL HYSTERECTOMY  2002  . APPENDECTOMY    . BACK SURGERY    . BREAST BIOPSY     right  . CATARACT EXTRACTION W/ INTRAOCULAR LENS  IMPLANT, BILATERAL  2009-2010  . CESAREAN SECTION  1973; 1978; 1981  . COLONOSCOPY WITH PROPOFOL N/A 10/29/2016   Procedure: COLONOSCOPY WITH PROPOFOL;  Surgeon: Danie Binder, MD;  [facility];  Service: Endoscopy;  Laterality: N/A;  10:00 am  . CYSTOSCOPY Left 04/13/2014   Procedure: CYSTOSCOPY FLEXIBLE;  Surgeon: Ardis Hughs, MD;  Location: [facility];  Service: Urology;  Laterality: Left;  with STENT  .  ESOPHAGOGASTRODUODENOSCOPY (EGD) WITH PROPOFOL N/A 10/29/2016   Procedure: ESOPHAGOGASTRODUODENOSCOPY (EGD) WITH PROPOFOL;  Surgeon: Danie Binder, MD;  [facility]: [facility];  Service: Endoscopy;  Laterality: N/A;  . EYE SURGERY    . implant and screws put in the right lower jaw  11/2008   dental surgery  . kidney blockage  ?1990's   " major surgery ;put kidney on pump for awhile"  . LITHOTRIPSY     "several times"  . LUMBAR FUSION  08/2010  . MASTECTOMY  1997   right  . NEPHROLITHOTOMY Left 04/13/2014   Procedure: LEFT PERCUTANEOUS NEPHROLITHOTOMY WITH SURGEON ACCESS;  Surgeon: Ardis Hughs, MD;  Location: [facility];  Service: Urology;  Laterality: Left;  . OVARIAN CYST SURGERY    . PARATHYROIDECTOMY  02/26/2012   Procedure:  PARATHYROIDECTOMY;  Surgeon: Ascencion Dike, MD;  Location: [facility];  Service: ENT;;  . Azzie [place] DILATION N/A 10/29/2016   Procedure: SAVORY DILATION;  Surgeon: Danie Binder, MD;  [facility]: [facility];  Service: Endoscopy;  Laterality: N/A;  . [place] SURGERY  2011  . urological surgery for blocked ureter secondary to kidney stone      OB History    No data available       Home Medications    Prior to Admission medications   Medication Sig Start Date End Date Taking? Authorizing Provider  acetaminophen (TYLENOL) 325 MG tablet Take 325 mg by mouth every 6 (six) hours as needed for mild pain.     [provider]  Cholecalciferol (VITAMIN D3 PO) Take 1 tablet by mouth daily.    [provider]  CINNAMON PO Take 1 capsule by mouth daily.    [provider]  dexlansoprazole (DEXILANT) 60 MG capsule Take 1 capsule (60 mg total) by mouth daily. Patient taking differently: Take 60 mg by mouth daily as needed.  07/03/16   Alycia Rossetti, MD  docusate sodium 100 MG CAPS Take 100 mg by mouth 2 (two) times daily. Patient taking differently: Take 100 mg by mouth daily as needed (constipation).  04/14/14   Ardis Hughs, MD  glimepiride (AMARYL) 2 MG tablet TAKE 2 TABLETS BY MOUTH EVERY MORNING AND 1 TABLET AT BEDTIME. Patient taking differently: Take 2-4 mg by mouth See admin instructions. TAKE 2 TABLETS BY MOUTH EVERY MORNING AND 1 TABLET AT BEDTIME. 07/03/16   [place], Modena Nunnery, MD  LORazepam (ATIVAN) 0.5 MG tablet Take 1 tablet (0.5 mg total) by mouth at bedtime as needed for anxiety. 04/26/15   Alycia Rossetti, MD  losartan (COZAAR) 25 MG tablet Take 1 tablet (25 mg total) by mouth every morning. Patient taking differently: Take 25 mg by mouth daily.  07/03/16   [place], Modena Nunnery, MD  metFORMIN (GLUCOPHAGE-XR) 500 MG 24 hr tablet TAKE 2 TABLETS BY MOUTH DAILY. 02/27/17   Alycia Rossetti, MD  Multiple Vitamin (MULITIVITAMIN WITH MINERALS) TABS Take 1 tablet by mouth  daily.    [provider]  Nebivolol HCl (BYSTOLIC) 20 MG TABS Take 1 tablet (20 mg total) by mouth 2 (two) times daily. 02/06/17   [place], Modena Nunnery, MD  tetrahydrozoline-zinc (VISINE-AC) 0.05-0.25 % ophthalmic solution Place 2 drops into both eyes 3 (three) times daily as needed (dry eyes).    [provider]    Family History Family History  Problem Relation Age of Onset  . Heart attack Mother   . Cancer Brother        Esophageal; smoker; deceased  83s  . Diabetes Daughter   . Leukemia Paternal Aunt        deceased 70s  . Pancreatic cancer Paternal Aunt        deceased 30  . Cancer Paternal Aunt        GI cancer; deceased 28s  . Cancer Cousin        kidney cancer; deceased 33; pat first cousin; daughter of aunt w/ leukemia  . Cancer Cousin        colon cancer @ 77; pat first cousin; son of aunt with GI cancer  . Anesthesia problems Neg Hx     Social History Social History  Substance Use Topics  . Smoking status: Never Smoker  . Smokeless tobacco: Never Used  . Alcohol use No     Comment: X2 DRINKS PER YEAR     Allergies   Demerol [meperidine]; Invokana [canagliflozin]; Metformin and related; Statins; and Ace inhibitors   Review of Systems Review of Systems  Skin: Positive for rash.  All other systems reviewed and are negative.    Physical Exam Triage Vital Signs ED Triage Vitals  Enc Vitals Group     BP 03/10/17 1253 (!) 153/87     Pulse Rate 03/10/17 1253 82     Resp 03/10/17 1253 14     Temp 03/10/17 1253 98.6 F (37 C)     Temp Source 03/10/17 1253 Oral     SpO2 03/10/17 1253 100 %     Weight --      Height --      Head Circumference --      Peak Flow --      Pain Score 03/10/17 1254 3     Pain Loc --      Pain Edu? --      Excl. in [place]? --    No data found.   Updated Vital Signs BP (!) 153/87 (BP Location: Left Arm) Comment: notified rn  Pulse 82   Temp 98.6 F (37 C) (Oral)   Resp 14   SpO2 100%    Physical Exam    Constitutional: She is oriented to person, place, and time. She appears well-developed and well-nourished.  HENT:  Head: Normocephalic.  Right Ear: External ear normal.  Left Ear: External ear normal.  Eyes: Conjunctivae are normal. Pupils are equal, round, and reactive to light.  Neck: Normal range of motion. Neck supple.  Pulmonary/Chest: Effort normal.  Musculoskeletal: Normal range of motion. She exhibits edema.  Neurological: She is alert and oriented to person, place, and time.  Skin: Skin is warm and dry. There is erythema.  Left foot shows mild swelling dorsally over the forefoot.  Nursing note and vitals reviewed.    UC Treatments / Results  Labs (all labs ordered are listed, but only abnormal results are displayed) Labs Reviewed - No data to display  EKG  EKG Interpretation None       Radiology No results found.  Procedures Procedures (including critical care time)  Medications Ordered in UC Medications - No data to display   Initial Impression / Assessment and Plan / UC Course  I have reviewed the triage vital signs and the nursing notes.  Pertinent labs & imaging results that were available during my care of the patient were reviewed by me and considered in my medical decision making (see chart for details).     Final Clinical Impressions(s) / UC Diagnoses   Final diagnoses:  None    New Prescriptions  New Prescriptions   No medications on file     Robyn Haber, MD 03/10/17 1312

## 2017-03-10 NOTE — ED Triage Notes (Signed)
Pt  Reports  By  Tick  3    Weeks  Ago    She  Reports  Pain  And   Swelling  Of  The  Left  Foot         With  Tingling  And  Pain        Denies    Any  Recent  Injury

## 2017-03-27 ENCOUNTER — Other Ambulatory Visit: Payer: Self-pay | Admitting: Family Medicine

## 2017-04-07 ENCOUNTER — Other Ambulatory Visit: Payer: Self-pay | Admitting: Family Medicine

## 2017-04-28 ENCOUNTER — Encounter: Payer: Self-pay | Admitting: Gastroenterology

## 2017-04-28 ENCOUNTER — Ambulatory Visit (INDEPENDENT_AMBULATORY_CARE_PROVIDER_SITE_OTHER): Payer: Medicare Other | Admitting: Gastroenterology

## 2017-04-28 VITALS — BP 141/82 | HR 84 | Temp 98.3°F | Ht 66.5 in | Wt 196.8 lb

## 2017-04-28 DIAGNOSIS — K580 Irritable bowel syndrome with diarrhea: Secondary | ICD-10-CM | POA: Diagnosis not present

## 2017-04-28 DIAGNOSIS — R1310 Dysphagia, unspecified: Secondary | ICD-10-CM

## 2017-04-28 DIAGNOSIS — K2100 Gastro-esophageal reflux disease with esophagitis, without bleeding: Secondary | ICD-10-CM

## 2017-04-28 DIAGNOSIS — R1319 Other dysphagia: Secondary | ICD-10-CM

## 2017-04-28 MED ORDER — DICYCLOMINE HCL 10 MG PO CAPS: ORAL_CAPSULE | ORAL | 1 refills | Status: DC

## 2017-04-28 MED ORDER — PANTOPRAZOLE SODIUM 40 MG PO TBEC: 40.0000 mg | DELAYED_RELEASE_TABLET | Freq: Every day | ORAL | 5 refills | Status: DC

## 2017-04-28 NOTE — Progress Notes (Signed)
Primary Care Physician: Alycia Rossetti, MD  Primary Gastroenterologist:  Barney Drain, MD   Chief Complaint  Patient presents with  . Dysphagia    HPI: [name] is a 71 y.o. female here For follow-up of EGD and colonoscopy done earlier this year. EGD showed moderate benign-appearing intrinsic stenosis status post dilation, mild gastritis. No H. pylori. Colonoscopy with diverticulosis, internal hemorrhoids, significant looping of the left colon.   She has a history of chronic postprandial urgency, bloating, loose stools. "Lives on" Imodium. She knows were all the bathrooms are. When she goes out to eat she scopes out where the bathroom is. She is tired of living this way. She takes 2 imodium before she leaves her house. Sometimes she takes too much and then can't have a BM. See 09/30/16 OV noted for previous work up. Previously had trial off metformin prior to coming back to see [place] this year but states it didn't help. Uses lactaid at times. Tries to avoid dairy.   Still feels like food sticking with foods/liquids/pills. Sometimes comes back up. Heartburn 24 hours a day even on Dexilant. Doesn't take it anymore cause it doesn't help. "Made things worse".    Asked why she didn't stress her bowel concerns at last ov and she reports cause she was getting a colonoscopy so she didn't think it was necessary.   Current Outpatient Prescriptions  Medication Sig Dispense Refill  . acetaminophen (TYLENOL) 325 MG tablet Take 325 mg by mouth every 6 (six) hours as needed for mild pain.     . Cholecalciferol (VITAMIN D3 PO) Take 1 tablet by mouth daily.    Marland Kitchen CINNAMON PO Take 1 capsule by mouth daily.    Marland Kitchen dexlansoprazole (DEXILANT) 60 MG capsule Take 1 capsule (60 mg total) by mouth daily. (Patient taking differently: Take 60 mg by mouth daily as needed. ) 30 capsule 6  . docusate sodium 100 MG CAPS Take 100 mg by mouth 2 (two) times daily. (Patient taking differently: Take 100 mg by  mouth daily as needed (constipation). ) 60 capsule 0  . glimepiride (AMARYL) 2 MG tablet TAKE 2 TABLETS BY MOUTH EVERY MORNING AND 1 TABLET AT BEDTIME. (Patient taking differently: Take 2-4 mg by mouth See admin instructions. TAKE 2 TABLETS BY MOUTH EVERY MORNING AND 1 TABLET AT BEDTIME.) 270 tablet 2  . LORazepam (ATIVAN) 0.5 MG tablet Take 1 tablet (0.5 mg total) by mouth at bedtime as needed for anxiety. 30 tablet 2  . losartan (COZAAR) 25 MG tablet TAKE ONE TABLET BY MOUTH EVERY MORNING. 90 tablet 0  . metFORMIN (GLUCOPHAGE-XR) 500 MG 24 hr tablet TAKE 2 TABLETS BY MOUTH DAILY. 60 tablet 1  . Multiple Vitamin (MULITIVITAMIN WITH MINERALS) TABS Take 1 tablet by mouth daily.    . Nebivolol HCl (BYSTOLIC) 20 MG TABS Take 1 tablet (20 mg total) by mouth 2 (two) times daily. 60 tablet 3  . tetrahydrozoline-zinc (VISINE-AC) 0.05-0.25 % ophthalmic solution Place 2 drops into both eyes 3 (three) times daily as needed (dry eyes).     No current facility-administered medications for this visit.     Allergies as of 04/28/2017 - Review Complete 04/28/2017  Allergen Reaction Noted  . Demerol [meperidine] Other (See Comments) 02/10/2012  . Invokana [canagliflozin] Hives 10/26/2014  . Metformin and related Diarrhea 10/26/2014  . Statins Other (See Comments) 08/22/2010  . Ace inhibitors Cough 02/09/2008    ROS:  General: Negative for anorexia, unintentional weight loss,  fever, chills, fatigue, weakness. ENT: Negative for hoarseness, difficulty swallowing , nasal congestion. CV: Negative for chest pain, angina, palpitations, dyspnea on exertion, peripheral edema.  Respiratory: Negative for dyspnea at rest, dyspnea on exertion, cough, sputum, wheezing.  GI: See history of present illness. GU:  Negative for dysuria, hematuria, urinary incontinence, urinary frequency, nocturnal urination.  Endo: Negative for unusual weight change.    Physical Examination:   BP (!) 141/82   Pulse 84   Temp 98.3 F  (36.8 C) (Oral)   Ht 5' 6.5" (1.689 m)   Wt 196 lb 12.8 oz (89.3 kg)   BMI 31.29 kg/m   General: Well-nourished, well-developed in no acute distress.  Eyes: No icterus. Mouth: Oropharyngeal mucosa moist and pink , no lesions erythema or exudate. Lungs: Clear to auscultation bilaterally.  Heart: Regular rate and rhythm, no murmurs rubs or gallops.  Abdomen: Bowel sounds are normal, nontender, nondistended, no hepatosplenomegaly or masses, no abdominal bruits or hernia , no rebound or guarding.   Extremities: No lower extremity edema. No clubbing or deformities. Neuro: Alert and oriented x 4   Skin: Warm and dry, no jaundice.   Psych: Alert and cooperative, normal mood and affect.  Labs:  Lab Results  Component Value Date   CREATININE 0.91 11/18/2016   BUN 9 11/18/2016   NA 137 11/18/2016   K 3.4 (L) 11/18/2016   CL 104 11/18/2016   CO2 26 11/18/2016   Lab Results  Component Value Date   ALT 23 11/18/2016   AST 26 11/18/2016   ALKPHOS 48 11/18/2016   BILITOT 0.5 11/18/2016   Lab Results  Component Value Date   WBC 8.6 11/18/2016   HGB 11.8 (L) 11/18/2016   HCT 35.9 (L) 11/18/2016   MCV 80.0 11/18/2016   PLT 250 11/18/2016    Lab Results  Component Value Date   IRON 52 11/18/2016   TIBC 332 11/18/2016   FERRITIN 89 11/18/2016     Lab Results  Component Value Date   FERRITIN 89 11/18/2016    Imaging Studies: No results found.

## 2017-04-28 NOTE — Progress Notes (Signed)
cc'ed to pcp °

## 2017-04-28 NOTE — Assessment & Plan Note (Signed)
Suspect postprandial abdominal bloating, urgency, loose stools related IBS given chronicity of symptoms and benign findings. I did offer CT abdomen pelvis to evaluate abdominal pain/bloating but patient declined at this time. Trial of Bentyl. Potential side effects discussed with patient. Limit high FODMAP foods. Handout provided. Return to the office in 6 weeks to see Dr. Oneida Alar. Call sooner if needed.

## 2017-04-28 NOTE — Patient Instructions (Addendum)
1. Take pantoprazole daily before breakfast for acid reflux. This takes the place of Dexilant. 2. Take bentyl 10mg  every morning before breakfast to PREVENT cramps and diarrhea. You may take before lunch, supper and at bedtime IF needed. Potential side effects include constipation,  difficulty urinating, drowsiness. 3. If you continue to have swallowing problems, bloating, abdominal pain, bowel concerns we can proceed with xray of your esophagus and CT scan of your belly to further evaluate. We can readdress at next office visit or call sooner if needed.  4. Office visit in 6 weeks with Dr. Oneida Alar.  5. Follow FODMAP diet. LIMIT any foods on the HIGH FODMAP column.  6. SOMETIMES METFORMIN CAN CONTRIBUTE TO BLOATING AND DIARRHEA. DR. Oneida Alar MAY SUGGEST TRIAL OFF METFORMIN IN THE FUTURE TO SEE IF THIS HELPS.    Food Choices for Gastroesophageal Reflux Disease, Adult When you have gastroesophageal reflux disease (GERD), the foods you eat and your eating habits are very important. Choosing the right foods can help ease the discomfort of GERD. Consider working with a diet and nutrition specialist (dietitian) to help you make healthy food choices. What general guidelines should I follow? Eating plan  Choose healthy foods low in fat, such as fruits, vegetables, whole grains, low-fat dairy products, and lean meat, fish, and poultry.  Eat frequent, small meals instead of three large meals each day. Eat your meals slowly, in a relaxed setting. Avoid bending over or lying down until 2-3 hours after eating.  Limit high-fat foods such as fatty meats or fried foods.  Limit your intake of oils, butter, and shortening to less than 8 teaspoons each day.  Avoid the following: ? Foods that cause symptoms. These may be different for different people. Keep a food diary to keep track of foods that cause symptoms. ? Alcohol. ? Drinking large amounts of liquid with meals. ? Eating meals during the 2-3 hours before  bed.  Cook foods using methods other than frying. This may include baking, grilling, or broiling. Lifestyle   Maintain a healthy weight. Ask your health care provider what weight is healthy for you. If you need to lose weight, work with your health care provider to do so safely.  Exercise for at least 30 minutes on 5 or more days each week, or as told by your health care provider.  Avoid wearing clothes that fit tightly around your waist and chest.  Do not use any products that contain nicotine or tobacco, such as cigarettes and e-cigarettes. If you need help quitting, ask your health care provider.  Sleep with the head of your bed raised. Use a wedge under the mattress or blocks under the bed frame to raise the head of the bed. What foods are not recommended? The items listed may not be a complete list. Talk with your dietitian about what dietary choices are best for you. Grains Pastries or quick breads with added fat. [place] toast. Vegetables Deep fried vegetables. [place] fries. Any vegetables prepared with added fat. Any vegetables that cause symptoms. For some people this may include tomatoes and tomato products, chili peppers, onions and garlic, and horseradish. Fruits Any fruits prepared with added fat. Any fruits that cause symptoms. For some people this may include citrus fruits, such as oranges, grapefruit, pineapple, and lemons. Meats and other protein foods High-fat meats, such as fatty beef or pork, hot dogs, ribs, ham, sausage, salami and bacon. Fried meat or protein, including fried fish and fried chicken. Nuts and nut butters. Dairy Whole  milk and chocolate milk. Sour cream. Cream. Ice cream. Cream cheese. Milk shakes. Beverages Coffee and tea, with or without caffeine. Carbonated beverages. Sodas. Energy drinks. Fruit juice made with acidic fruits (such as orange or grapefruit). Tomato juice. Alcoholic drinks. Fats and oils Butter. Margarine. Shortening. Ghee. Sweets  and desserts Chocolate and cocoa. Donuts. Seasoning and other foods Pepper. Peppermint and spearmint. Any condiments, herbs, or seasonings that cause symptoms. For some people, this may include curry, hot sauce, or vinegar-based salad dressings. Summary  When you have gastroesophageal reflux disease (GERD), food and lifestyle choices are very important to help ease the discomfort of GERD.  Eat frequent, small meals instead of three large meals each day. Eat your meals slowly, in a relaxed setting. Avoid bending over or lying down until 2-3 hours after eating.  Limit high-fat foods such as fatty meat or fried foods. This information is not intended to replace advice given to you by your health care provider. Make sure you discuss any questions you have with your health care provider. Document Released: 09/09/2005 Document Revised: 09/10/2016 Document Reviewed: 09/10/2016 Elsevier Interactive Patient Education  2017 [employer]  Irritable Bowel Syndrome, Adult Irritable bowel syndrome (IBS) is not one specific disease. It is a group of symptoms that affects the organs responsible for digestion (gastrointestinal or GI tract). To regulate how your GI tract works, your body sends signals back and forth between your intestines and your brain. If you have IBS, there may be a problem with these signals. As a result, your GI tract does not function normally. Your intestines may become more sensitive and overreact to certain things. This is especially true when you eat certain foods or when you are under stress. There are four types of IBS. These may be determined based on the consistency of your stool:  IBS with diarrhea.  IBS with constipation.  Mixed IBS.  Unsubtyped IBS.  It is important to know which type of IBS you have. Some treatments are more likely to be helpful for certain types of IBS. What are the causes? The exact cause of IBS is not known. What increases the risk? You may have  a higher risk of IBS if:  You are a woman.  You are younger than 70 years old.  You have a family history of IBS.  You have mental health problems.  You have had bacterial infection of your GI tract.  What are the signs or symptoms? Symptoms of IBS vary from person to person. The main symptom is abdominal pain or discomfort. Additional symptoms usually include one or more of the following:  Diarrhea, constipation, or both.  Abdominal swelling or bloating.  Feeling full or sick after eating a small or regular-size meal.  Frequent gas.  Mucus in the stool.  A feeling of having more stool left after a bowel movement.  Symptoms tend to come and go. They may be associated with stress, psychiatric conditions, or nothing at all. How is this diagnosed? There is no specific test to diagnose IBS. Your health care provider will make a diagnosis based on a physical exam, medical history, and your symptoms. You may have other tests to rule out other conditions that may be causing your symptoms. These may include:  Blood tests.  X-rays.  CT scan.  Endoscopy and colonoscopy. This is a test in which your GI tract is viewed with a long, thin, flexible tube.  How is this treated? There is no cure for IBS, but treatment can  help relieve symptoms. IBS treatment often includes:  Changes to your diet, such as: ? Eating more fiber. ? Avoiding foods that cause symptoms. ? Drinking more water. ? Eating regular, medium-sized portioned meals.  Medicines. These may include: ? Fiber supplements if you have constipation. ? Medicine to control diarrhea (antidiarrheal medicines). ? Medicine to help control muscle spasms in your GI tract (antispasmodic medicines). ? Medicines to help with any mental health issues, such as antidepressants or tranquilizers.  Therapy. ? Talk therapy may help with anxiety, depression, or other mental health issues that can make IBS symptoms worse.  Stress  reduction. ? Managing your stress can help keep symptoms under control.  Follow these instructions at home:  Take medicines only as directed by your health care provider.  Eat a healthy diet. ? Avoid foods and drinks with added sugar. ? Include more whole grains, fruits, and vegetables gradually into your diet. This may be especially helpful if you have IBS with constipation. ? Avoid any foods and drinks that make your symptoms worse. These may include dairy products and caffeinated or carbonated drinks. ? Do not eat large meals. ? Drink enough fluid to keep your urine clear or pale yellow.  Exercise regularly. Ask your health care provider for recommendations of good activities for you.  Keep all follow-up visits as directed by your health care provider. This is important. Contact a health care provider if:  You have constant pain.  You have trouble or pain with swallowing.  You have worsening diarrhea. Get help right away if:  You have severe and worsening abdominal pain.  You have diarrhea and: ? You have a rash, stiff neck, or severe headache. ? You are irritable, sleepy, or difficult to awaken. ? You are weak, dizzy, or extremely thirsty.  You have bright red blood in your stool or you have black tarry stools.  You have unusual abdominal swelling that is painful.  You vomit continuously.  You vomit blood (hematemesis).  You have both abdominal pain and a fever. This information is not intended to replace advice given to you by your health care provider. Make sure you discuss any questions you have with your health care provider. Document Released: 09/09/2005 Document Revised: 02/09/2016 Document Reviewed: 05/27/2014 Elsevier Interactive Patient Education  2018 [employer].

## 2017-04-28 NOTE — Assessment & Plan Note (Signed)
Persistent dysphagia. No significant improvement status post esophageal dilation. Reports constant heartburn, stopped Dexilant because it didn't seem to make a difference. Offered barium pill esophagram the patient does not want any test this time. We'll switch PPIs to pantoprazole 40 mg daily to see if this makes any difference. Reinforced anti-reflex measures. Return to the office in 6 weeks to see Dr. Oneida Alar.

## 2017-05-24 ENCOUNTER — Other Ambulatory Visit: Payer: Self-pay | Admitting: Family Medicine

## 2017-06-09 ENCOUNTER — Other Ambulatory Visit: Payer: Self-pay | Admitting: Family Medicine

## 2017-06-18 ENCOUNTER — Encounter: Payer: Self-pay | Admitting: Gastroenterology

## 2017-06-18 ENCOUNTER — Ambulatory Visit (INDEPENDENT_AMBULATORY_CARE_PROVIDER_SITE_OTHER): Payer: Medicare Other | Admitting: Gastroenterology

## 2017-06-18 DIAGNOSIS — K219 Gastro-esophageal reflux disease without esophagitis: Secondary | ICD-10-CM

## 2017-06-18 DIAGNOSIS — K580 Irritable bowel syndrome with diarrhea: Secondary | ICD-10-CM

## 2017-06-18 MED ORDER — CIPROFLOXACIN HCL 500 MG PO TABS: ORAL_TABLET | ORAL | 0 refills | Status: DC

## 2017-06-18 MED ORDER — LANSOPRAZOLE 30 MG PO CPDR: DELAYED_RELEASE_CAPSULE | ORAL | 11 refills | Status: DC

## 2017-06-18 MED ORDER — METRONIDAZOLE 500 MG PO TABS: 500.0000 mg | ORAL_TABLET | Freq: Two times a day (BID) | ORAL | 0 refills | Status: DC

## 2017-06-18 NOTE — Progress Notes (Signed)
ON RECALL  °

## 2017-06-18 NOTE — Progress Notes (Signed)
cc'ed to pcp °

## 2017-06-18 NOTE — Assessment & Plan Note (Addendum)
SYMPTOMS NOT IDEALLY CONTROLLED and may be EXACERBATED BY SMALL INTESTINE BACTERIAL OVERGROWTH.  ADD CIPRO/FLAGYL BID FOR 5 DAYS. MEDICATION SIDE EFFECTS INCLUDE HEEL PAIN, NAUSEA, VOMITING. PATIENT SHOULD AVOID ALCOHOL AND COUGH SYRUP WITH ALCOHOL.  WHILE TAKING CIPRO ONLY TAKE GLIMEPIRIDE ONE THE MORNING AND HOLD THE NIGHT TIME DOSE. PLEASE CALL IF YOU HAVE TROUBLE WITH AN ELEVATED GLUCOSE. CALL IN 3 WEEKS IF SYMPTOMS ARE NOT IMPROVED THEN WILL ADD VIBERZI AVOID DIARY FOLLOW UP IN 3 MOS.

## 2017-06-18 NOTE — Addendum Note (Signed)
Addended by: Danie Binder on: 06/18/2017 11:36 AM   Modules accepted: Orders

## 2017-06-18 NOTE — Progress Notes (Addendum)
Subjective:    Patient ID: [name], female    DOB: 1946/01/27, 71 y.o.   MRN: [id number]  HPI Took new pill and was bloated and diarrhea. Only lingering side effect: urinating real bad. TRYING TO WATCH WHAT SHE EATS. IF SHE EATS HAS BM: WATERY TO LOOSE. RARE SOLID UNLESS SHE TAKES IMMODIUM. STARTED PROTONIX AND BENTYL AND HAD SIDE EFFECTS. RARE CRAMP IN LLQ AND THEN HAVE BM. HAD WATERY STOOLS FOR MANY YEARS. TAKES LACTASE AS WELL. WHEN SHE HAS PAIN SEVERAL RUNS AND THEN IT GETS BETTER. BMS: IF NO EAT, NONE, IF EATS SOLID WILL HAVE ONE IN 30 MINS. WHEN IT HITS HER IT'S URGENT. SYMPTOMS NOT WORSE BUT NOT IDEALLY CONTROLLED. Dexilant didn't show any improvement. LETTUCE IS A GOOD LAXATIVE. NOT REALLY TAKING DOCUSATE WHEN SHE WAS ON ABX BECAUSE SHE GOT CONSTIPATED. MAY HAVE PELLET LIKE STOOL. RARE CHEST PAIN OR SOB DUE TO BEING OVERWEIGHT AND NOT EXERCISING. MAY HAVE TROUBLE WITH SWALLOWING OCCASIONALLY.  PT DENIES FEVER, CHILLS, HEMATOCHEZIA, nausea, vomiting, melena, CHANGE IN BOWEL IN HABITS, problems swallowing, problems with sedation,OR heartburn or indigestion.  Past Medical History:  Diagnosis Date  . Abdominal hernia 02/26/12   unrepaired  . 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 Anemia   . Anxiety   . Blood transfusion 1980's  . Complication of anesthesia    has a hard time waking up; can't lay flat  . Degenerative disc disease, lumbar    pressing on L3 and L4  . 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 DJD (degenerative joint disease) of lumbar spine   . Dysrhythmia    "irregular"  . Exertional dyspnea   . GERD (gastroesophageal reflux disease)    mann  . Heart murmur    "think I outgrew it"  . History of kidney stones   . History of stomach ulcers   . Hx: UTI (urinary tract infection)   . Hyperlipidemia   . Hypersensitivity    in tongue  . Hypertension   . IBS (irritable bowel  syndrome)   . Insomnia   . Kidney cysts    RT KIDNEY  . Kidney stones 2009   s/p lithotripsy  . Obesity   . PONV (postoperative nausea and vomiting)   . Shortness of breath    unable to lie flat   Past Surgical History:  Procedure Laterality Date  . ABDOMINAL HYSTERECTOMY  2002  . APPENDECTOMY    . BACK SURGERY    . BREAST BIOPSY     right  . CATARACT EXTRACTION W/ INTRAOCULAR LENS  IMPLANT, BILATERAL  2009-2010  . CESAREAN SECTION  1973; 1978; 1981  . COLONOSCOPY WITH PROPOFOL N/A 10/29/2016     . CYSTOSCOPY Left 04/13/2014     . ESOPHAGOGASTRODUODENOSCOPY (EGD) WITH PROPOFOL N/A 10/29/2016     . EYE SURGERY    . implant and screws put in the right lower jaw  11/2008   dental surgery  . kidney blockage  ?1990's   " major surgery ;put kidney on pump for awhile"  . LITHOTRIPSY     "several times"  . LUMBAR FUSION  08/2010  . MASTECTOMY  1997   right  . NEPHROLITHOTOMY Left 04/13/2014     . OVARIAN CYST SURGERY    . PARATHYROIDECTOMY  02/26/2012     . SAVORY DILATION N/A 10/29/2016     .  [place]  2011  . urological surgery for blocked ureter secondary to kidney stone     Allergies  Allergen Reactions  . Demerol [Meperidine] Other (See Comments)    Lost consciousness  . Bentyl [Dicyclomine Hcl] Swelling  . Invokana [Canagliflozin] Hives  . Metformin And Related Diarrhea  . Protonix [Pantoprazole Sodium] Swelling  . Statins Other (See Comments)    myalgia  . Ace Inhibitors Cough    Current Outpatient Prescriptions  Medication Sig Dispense Refill  . acetaminophen (TYLENOL) 325 MG tablet Take 325 mg by mouth every 6 (six) hours as needed for mild pain.     Marland Kitchen BYSTOLIC 20 MG TABS TAKE 1 TABLET BY MOUTH TWICE DAILY.    Marland Kitchen Cholecalciferol (VITAMIN D3 PO) Take 1 tablet by mouth daily.    Marland Kitchen CINNAMON PO Take 1 capsule by mouth. Takes sometimes    . glimepiride (AMARYL) 2 MG tablet TAKE 2 TABLETS BY MOUTH EVERY MORNING AND 1 TABLET AT BEDTIME.)    . losartan (COZAAR) 25  MG tablet TAKE ONE TABLET BY MOUTH EVERY MORNING.    . metFORMIN (GLUCOPHAGE-XR) 500 MG 24 hr tablet TAKE 2 TABLETS BY MOUTH DAILY.    . Multiple Vitamin (MULITIVITAMIN WITH MINERALS) TABS Take 1 tablet by mouth daily.    Marland Kitchen tetrahydrozoline-zinc (VISINE-AC) 0.05-0.25 % ophthalmic solution Place 2 drops into both eyes 3 (three) times daily as needed (dry eyes).    .      .      .      .       Review of Systems PER HPI OTHERWISE ALL SYSTEMS ARE NEGATIVE.    Objective:   Physical Exam  Constitutional: She is oriented to person, place, and time. She appears well-developed and well-nourished. No distress.  HENT:  Head: Normocephalic and atraumatic.  Mouth/Throat: Oropharynx is clear and moist. No oropharyngeal exudate.  Eyes: Pupils are equal, round, and reactive to light. No scleral icterus.  Neck: Normal range of motion. Neck supple.  Cardiovascular: Normal rate, regular rhythm and normal heart sounds.   Pulmonary/Chest: Effort normal and breath sounds normal. No respiratory distress.  Abdominal: Soft. Bowel sounds are normal. She exhibits no distension. There is no tenderness.  MIDLINE BULGE THAT INCREASES WITH VALSALVA, REDUCIBLE  Musculoskeletal: She exhibits no edema.  Lymphadenopathy:    She has no cervical adenopathy.  Neurological: She is alert and oriented to person, place, and time.  NO FOCAL DEFICITS  Psychiatric: She has a normal mood and affect.  Vitals reviewed.     Assessment & Plan:

## 2017-06-18 NOTE — Patient Instructions (Addendum)
ADD CIPRO/FLAGYL TWICE DAILY FOR 5 DAYS. MEDICATION SIDE EFFECTS INCLUDE HEEL PAIN, NAUSEA, VOMITING. PATIENT SHOULD AVOID ALCOHOL AND COUGH SYRUP WITH ALCOHOL.   WHILE TAKING CIPRO ONLY TAKE GLIMEPIRIDE ONE THE MORNING AND HOLD THE NIGHT TIME DOSE. PLEASE CALL IF YOU HAVE TROUBLE WITH AN ELEVATED GLUCOSE.  PLEASE CALL IN 3 WEEKS IF SYMPTOMS ARE NOT IMPROVED THEN WILL ADD VIBERZI ONCE OR TWICE A DAY.  AVOID DIARY. IF YOU CONSUME DAIRY, ADD LACTASE 3 PILLS WITH MEALS UP TO THREE TIMES A DAY.    AVOID REFLUX TRIGGERS. SEE INFO BELOW. CONTINUE YOUR WEIGHT LOSS EFFORTS. START PREVACID. TAKE 30 MINUTES PRIOR TO BREAKFAST.   FOLLOW UP IN 3 MOS.    Lifestyle and home remedies TO MANAGE REFLUX  You may eliminate or reduce the frequency of heartburn by making the following lifestyle changes:  . Control your weight. Being overweight is a major risk factor for heartburn and GERD. Excess pounds put pressure on your abdomen, pushing up your stomach and causing acid to back up into your esophagus.   . Eat smaller meals. 4 TO 6 MEALS A DAY. This reduces pressure on the lower esophageal sphincter, helping to prevent the valve from opening and acid from washing back into your esophagus.   Dolphus Jenny your belt. Clothes that fit tightly around your waist put pressure on your abdomen and the lower esophageal sphincter.   . Eliminate heartburn triggers. Everyone has specific triggers. Common triggers such as fatty or fried foods, spicy food, tomato sauce, carbonated beverages, alcohol, chocolate, mint, garlic, onion, caffeine and nicotine may make heartburn worse.   Marland Kitchen Avoid stooping or bending. Tying your shoes is OK. Bending over for longer periods to weed your garden isn't, especially soon after eating.   . Don't lie down after a meal. Wait at least three to four hours after eating before going to bed, and don't lie down right after eating.   Marland Kitchen PUT THE HEAD OF YOUR BED ON 6 INCH  BLOCKS.   Alternative medicine . Several home remedies exist for treating GERD, but they provide only temporary relief. They include drinking baking soda (sodium bicarbonate) added to water or drinking other fluids such as baking soda mixed with cream of tartar and water.  . Although these liquids create temporary relief by neutralizing, washing away or buffering acids, eventually they aggravate the situation by adding gas and fluid to your stomach, increasing pressure and causing more acid reflux. Further, adding more sodium to your diet may increase your blood pressure and add stress to your heart, and excessive bicarbonate ingestion can alter the acid-base balance in your body.

## 2017-06-18 NOTE — Assessment & Plan Note (Signed)
SYMPTOMS NOT IDEALLY CONTROLLED OFF PPI.  TRY PREVACID 30 MINS PRIOR TO FIRST MEAL.  AVOID REFLUX TRIGGERS. FOLLOW UP IN 3 MOS.

## 2017-06-26 ENCOUNTER — Encounter: Payer: Self-pay | Admitting: Physician Assistant

## 2017-06-26 ENCOUNTER — Ambulatory Visit (INDEPENDENT_AMBULATORY_CARE_PROVIDER_SITE_OTHER): Payer: Medicare Other | Admitting: Physician Assistant

## 2017-06-26 ENCOUNTER — Other Ambulatory Visit: Payer: Self-pay | Admitting: Family Medicine

## 2017-06-26 ENCOUNTER — Ambulatory Visit
Admission: RE | Admit: 2017-06-26 | Discharge: 2017-06-26 | Disposition: A | Discharge status: Home / Self Care | Payer: Medicare Other | Source: Ambulatory Visit | Attending: Physician Assistant | Admitting: Physician Assistant

## 2017-06-26 VITALS — BP 128/78 | HR 82 | Temp 98.0°F | Resp 16 | Ht 66.5 in | Wt 201.0 lb

## 2017-06-26 DIAGNOSIS — S93401A Sprain of unspecified ligament of right ankle, initial encounter: Secondary | ICD-10-CM

## 2017-06-26 NOTE — Progress Notes (Signed)
Patient ID: ARLYCE CIRCLE MRN: [id number], DOB: November 01, 1945, 71 y.o. Date of Encounter: 06/26/2017, 11:17 AM    Chief Complaint:  Chief Complaint  Patient presents with  . R Foot Pain    x2 weeks- swelling and pain to R lower leg and ankle/ foot- states that she had tingling in leg, butnow has swelling only in foot     HPI: 71 y.o. year old female presents with above.   She reports that recently she has been having "pins and needles" sensation to the anterior aspect of her right knee. Says that since she started noticing that discomfort she has more recently now been noticing that when she bears weight on that right leg that she feels a sore area down below her right ankle. Points to lateral aspect of right foot below the ankle as area of discomfort. Says that she is going out of town on October 22 and will be doing a lot of walking so wanted to come get this evaluated prior to that.  States that she has had no known injury or trauma to the area. Has not "twisted her ankle" as far as she is aware.  Regarding the knee discomfort it is her own words that she keeps referencing it as "pins and needles ". She states that she has not had any locking up of the knee or giving way of the knee.   Home Meds:   Outpatient Medications Prior to Visit  Medication Sig Dispense Refill  . acetaminophen (TYLENOL) 325 MG tablet Take 325 mg by mouth every 6 (six) hours as needed for mild pain.     Marland Kitchen BYSTOLIC 20 MG TABS TAKE 1 TABLET BY MOUTH TWICE DAILY. 60 tablet 0  . Cholecalciferol (VITAMIN D3 PO) Take 1 tablet by mouth daily.    Marland Kitchen CINNAMON PO Take 1 capsule by mouth. Takes sometimes    . ciprofloxacin (CIPRO) 500 MG tablet 1 PO BID FOR 5 DAYS 10 tablet 0  . glimepiride (AMARYL) 2 MG tablet TAKE 2 TABLETS BY MOUTH EVERY MORNING AND 1 TABLET AT BEDTIME. (Patient taking differently: Take 2-4 mg by mouth See admin instructions. TAKE 2 TABLETS BY MOUTH EVERY MORNING AND 1 TABLET AT BEDTIME.) 270  tablet 2  . lansoprazole (PREVACID) 30 MG capsule 1 PO 30 MINS PRIOR TO FIRST MEAL 30 capsule 11  . losartan (COZAAR) 25 MG tablet TAKE ONE TABLET BY MOUTH EVERY MORNING. 90 tablet 0  . metFORMIN (GLUCOPHAGE-XR) 500 MG 24 hr tablet TAKE 2 TABLETS BY MOUTH DAILY. 60 tablet 0  . metroNIDAZOLE (FLAGYL) 500 MG tablet Take 1 tablet (500 mg total) by mouth 2 (two) times daily. FOR 5 DAYS 10 tablet 0  . Multiple Vitamin (MULITIVITAMIN WITH MINERALS) TABS Take 1 tablet by mouth daily.    Marland Kitchen tetrahydrozoline-zinc (VISINE-AC) 0.05-0.25 % ophthalmic solution Place 2 drops into both eyes 3 (three) times daily as needed (dry eyes).     No facility-administered medications prior to visit.     Allergies:  Allergies  Allergen Reactions  . Demerol [Meperidine] Other (See Comments)    Lost consciousness  . Bentyl [Dicyclomine Hcl] Swelling  . Invokana [Canagliflozin] Hives  . Metformin And Related Diarrhea  . Protonix [Pantoprazole Sodium] Swelling  . Statins Other (See Comments)    myalgia  . Ace Inhibitors Cough      Review of Systems: See HPI for pertinent ROS. All other ROS negative.    Physical Exam: Blood pressure 128/78, pulse 82, temperature  75 F (36.7 C), temperature source Oral, resp. rate 16, height 5' 6.5" (1.689 m), weight 91.2 kg (201 lb), SpO2 98 %., Body mass index is 31.96 kg/m. General: AAF.  Appears in no acute distress. Neck: Supple. No thyromegaly. No lymphadenopathy. Lungs: Clear bilaterally to auscultation without wheezes, rales, or rhonchi. Breathing is unlabored. Heart: Regular rhythm. No murmurs, rubs, or gallops. Msk:  Strength and tone normal for age. Extremities/Skin:  Right Foot: She has ~ 2 cm area of tenderness with palpation---this is inferior and distal to the lateral malleolus. There is no other area of tenderness. There is a focal area of significant tenderness in that one area. There is no erythema at that site. There is no ecchymosis. There is no  warmth. Neuro: Alert and oriented X 3. Moves all extremities spontaneously. Gait is normal. CNII-XII grossly in tact. Psych:  Responds to questions appropriately with a normal affect.     ASSESSMENT AND PLAN:  71 y.o. year old female with  1. Sprain of right ankle, unspecified ligament, initial encounter Area of pain is consistent with a sprain. Will obtain x-ray to rule out fracture or any other abnormality. We'll follow-up with her when I get this result. If the x-ray is negative then she is to treat this with rest and elevation and ice. I also gave her a handout with exercises to strengthen the ankle but she is to hold off on doing knees after the pain has resolved. - DG Ankle Complete Right; Future   Signed, [address] [place], [place], [facility] 06/26/2017 11:17 AM

## 2017-07-07 ENCOUNTER — Other Ambulatory Visit: Payer: Self-pay | Admitting: Family Medicine

## 2017-07-26 ENCOUNTER — Other Ambulatory Visit: Payer: Self-pay | Admitting: Family Medicine

## 2017-08-04 ENCOUNTER — Encounter: Payer: Self-pay | Admitting: Gastroenterology

## 2017-08-07 ENCOUNTER — Other Ambulatory Visit: Payer: Self-pay | Admitting: Family Medicine

## 2017-08-26 ENCOUNTER — Other Ambulatory Visit: Payer: Self-pay | Admitting: Family Medicine

## 2017-09-03 ENCOUNTER — Other Ambulatory Visit: Payer: Self-pay | Admitting: Family Medicine

## 2017-09-24 ENCOUNTER — Encounter: Payer: Self-pay | Admitting: Family Medicine

## 2017-09-24 ENCOUNTER — Ambulatory Visit: Payer: Medicare Other | Admitting: Family Medicine

## 2017-09-24 VITALS — BP 146/82 | HR 83 | Temp 98.5°F | Resp 18 | Ht 66.5 in | Wt 196.0 lb

## 2017-09-24 DIAGNOSIS — I10 Essential (primary) hypertension: Secondary | ICD-10-CM

## 2017-09-24 DIAGNOSIS — E119 Type 2 diabetes mellitus without complications: Secondary | ICD-10-CM

## 2017-09-24 DIAGNOSIS — R5383 Other fatigue: Secondary | ICD-10-CM | POA: Diagnosis not present

## 2017-09-24 DIAGNOSIS — J209 Acute bronchitis, unspecified: Secondary | ICD-10-CM

## 2017-09-24 DIAGNOSIS — E7800 Pure hypercholesterolemia, unspecified: Secondary | ICD-10-CM

## 2017-09-24 LAB — CBC
HEMATOCRIT: 36.9 % (ref 35.0–45.0)
HEMOGLOBIN: 12.2 g/dL (ref 11.7–15.5)
MCH: 26.6 pg — ABNORMAL LOW (ref 27.0–33.0)
MCHC: 33.1 g/dL (ref 32.0–36.0)
MCV: 80.4 fL (ref 80.0–100.0)
Platelets: 199 10*3/uL (ref 140–400)
RBC: 4.59 10*6/uL (ref 3.80–5.10)
RDW: 14.9 % (ref 11.0–15.0)
WBC: 8.6 10*3/uL (ref 3.8–10.8)

## 2017-09-24 LAB — INFLUENZA A AND B AG, IMMUNOASSAY
INFLUENZA A ANTIGEN: NOT DETECTED
INFLUENZA B ANTIGEN: NOT DETECTED

## 2017-09-24 NOTE — Progress Notes (Signed)
   Subjective:    Patient ID: [name], female    DOB: [birth year], 72 y.o.   MRN: [id number]  Patient presents for Head & Chest congestion, cough, runny nose (Pt was feeling fatigued before cold)   Pt here with congestion, cough, head cold symptoms for past 2 weeks. No recent fever. Took OTC aribornne, theraflu. Decreased appetite, few loose when she does eat. Worse part is cough with ratteling in chest   But has experienced fatigue before getting sick.    Known Diabetic but last OV for diabetes was Dec 2017, A1C last done in Oct 2017 was 7.9% , has not checked CBG  Note also seen by by GI in past year due to dysphia and IBS symptoms, had colonoscopy in Feb 2018 Seen by oncology with history of right breast cancer, complained of fatigue in Feb 2018l abs were fairly unremkarbale, has mild chronic anemia      Review Of Systems:  GEN-+ fatigue, fever, weight loss,weakness, recent illness HEENT- denies eye drainage, change in vision, nasal discharge, CVS- denies chest pain, palpitations RESP- denies SOB,+ cough, wheeze ABD- denies N/V, +change in stools, abd pain GU- denies dysuria, hematuria, dribbling, incontinence MSK- denies joint pain, muscle aches, injury Neuro- denies headache, dizziness, syncope, seizure activity       Objective:    BP (!) 146/82   Pulse 83   Temp 98.5 F (36.9 C) (Oral)   Resp 18   Ht 5' 6.5" (1.689 m)   Wt 196 lb (88.9 kg)   SpO2 97%   BMI 31.16 kg/m  GEN- NAD, alert and oriented x3, non toxic appearing  HEENT- PERRL, EOMI, non injected sclera, pink conjunctiva, MMM, oropharynx clear, TM clear bilat, no effusion, nares clear rhinorrhea, oropharynx clear  Neck- Supple, noLAD CVS- RRR, no murmur RESP-mild upper airway congestion,no rales, bronchitic cough, no retractions ABD-NABS,soft,NT,ND EXT- No edema Pulses- Radial  2+        Assessment & Plan:      Problem List Items Addressed This Visit      Unprioritized   Hyperlipidemia    Relevant Orders   Lipid panel   Essential hypertension   Type 2 diabetes mellitus (HCC)   Relevant Orders   Hemoglobin A1c   TSH    Other Visit Diagnoses    Other fatigue    -  Primary   Relevant Orders   Influenza A and B Ag, Immunoassay (Completed)   Comprehensive metabolic panel (Completed)   TSH   CBC (Completed)   Acute bronchitis, unspecified organism       Treat bronchitis, based on age, unknown diabetic controlled state, high risk for PNA, Flu in office neg. Zpak, tessalon, stop all oteher OTC meds and supplements except Vitamin C/zinc okay. Concern diabetes is uncontrolled in setting of her "fatigue". Check A1C, TSH, CBC in office no significant anemia. WBC also normal  HTN-mildly elevated BP but also taking multiple OTC supplements. No change to BP meds Will have her F/U in 2 weeks for some of her other concerns such as foot pain? Neuropathy      Note: This dictation was prepared with Dragon dictation along with smaller phrase technology. Any transcriptional errors that result from this process are unintentional.

## 2017-09-24 NOTE — Patient Instructions (Addendum)
Take antibiotics as prescribed Cough pill I will call with results F/U 2 weeks for recheck

## 2017-09-25 LAB — HEMOGLOBIN A1C
Hgb A1c MFr Bld: 9 % of total Hgb — ABNORMAL HIGH (ref <5.7)
Mean Plasma Glucose: 212 (calc)
eAG (mmol/L): 11.7 (calc)

## 2017-09-25 LAB — LIPID PANEL
CHOL/HDL RATIO: 3.9 (calc) (ref <5.0)
CHOLESTEROL: 180 mg/dL (ref <200)
HDL: 46 mg/dL — AB (ref >50)
LDL CHOLESTEROL (CALC): 98 mg/dL
Non-HDL Cholesterol (Calc): 134 mg/dL (calc) — ABNORMAL HIGH (ref <130)
TRIGLYCERIDES: 239 mg/dL — AB (ref <150)

## 2017-09-25 LAB — TSH: TSH: 0.81 mIU/L (ref 0.40–4.50)

## 2017-09-26 LAB — COMPREHENSIVE METABOLIC PANEL
AG RATIO: 1.3 (calc) (ref 1.0–2.5)
ALT: 18 U/L (ref 6–29)
AST: 23 U/L (ref 10–35)
Albumin: 3.8 g/dL (ref 3.6–5.1)
Alkaline phosphatase (APISO): 55 U/L (ref 33–130)
BILIRUBIN TOTAL: 0.4 mg/dL (ref 0.2–1.2)
BUN/Creatinine Ratio: 5 (calc) — ABNORMAL LOW (ref 6–22)
BUN: 5 mg/dL — AB (ref 7–25)
CALCIUM: 8.7 mg/dL (ref 8.6–10.4)
CO2: 26 mmol/L (ref 20–32)
Chloride: 101 mmol/L (ref 98–110)
Creat: 0.95 mg/dL — ABNORMAL HIGH (ref 0.60–0.93)
GLUCOSE: 215 mg/dL — AB (ref 65–99)
Globulin: 3 g/dL (calc) (ref 1.9–3.7)
Potassium: 3.6 mmol/L (ref 3.5–5.3)
SODIUM: 137 mmol/L (ref 135–146)
TOTAL PROTEIN: 6.8 g/dL (ref 6.1–8.1)

## 2017-09-26 LAB — VITAMIN B12: VITAMIN B 12: 632 pg/mL (ref 200–1100)

## 2017-09-26 LAB — TEST AUTHORIZATION

## 2017-09-27 ENCOUNTER — Other Ambulatory Visit: Payer: Self-pay | Admitting: Family Medicine

## 2017-09-30 ENCOUNTER — Other Ambulatory Visit: Payer: Self-pay | Admitting: *Deleted

## 2017-09-30 MED ORDER — EMPAGLIFLOZIN 25 MG PO TABS: 25.0000 mg | ORAL_TABLET | Freq: Every day | ORAL | 3 refills | Status: DC

## 2017-10-04 ENCOUNTER — Other Ambulatory Visit: Payer: Self-pay | Admitting: Family Medicine

## 2017-10-06 ENCOUNTER — Other Ambulatory Visit: Payer: Self-pay | Admitting: Family Medicine

## 2017-10-13 ENCOUNTER — Ambulatory Visit: Payer: Medicare Other | Admitting: Family Medicine

## 2017-10-13 ENCOUNTER — Other Ambulatory Visit: Payer: Self-pay

## 2017-10-13 ENCOUNTER — Encounter: Payer: Self-pay | Admitting: Family Medicine

## 2017-10-13 ENCOUNTER — Ambulatory Visit (HOSPITAL_COMMUNITY)
Admission: RE | Admit: 2017-10-13 | Discharge: 2017-10-13 | Disposition: A | Discharge status: Home / Self Care | Payer: Medicare Other | Source: Ambulatory Visit | Attending: Family Medicine | Admitting: Family Medicine

## 2017-10-13 VITALS — BP 128/66 | HR 88 | Resp 14 | Ht 65.5 in | Wt 189.0 lb

## 2017-10-13 DIAGNOSIS — R634 Abnormal weight loss: Secondary | ICD-10-CM

## 2017-10-13 DIAGNOSIS — E1143 Type 2 diabetes mellitus with diabetic autonomic (poly)neuropathy: Secondary | ICD-10-CM

## 2017-10-13 DIAGNOSIS — R9431 Abnormal electrocardiogram [ECG] [EKG]: Secondary | ICD-10-CM

## 2017-10-13 DIAGNOSIS — R0789 Other chest pain: Secondary | ICD-10-CM | POA: Diagnosis present

## 2017-10-13 DIAGNOSIS — IMO0002 Reserved for concepts with insufficient information to code with codable children: Secondary | ICD-10-CM

## 2017-10-13 DIAGNOSIS — J209 Acute bronchitis, unspecified: Secondary | ICD-10-CM

## 2017-10-13 DIAGNOSIS — B379 Candidiasis, unspecified: Secondary | ICD-10-CM | POA: Diagnosis not present

## 2017-10-13 DIAGNOSIS — E1165 Type 2 diabetes mellitus with hyperglycemia: Secondary | ICD-10-CM | POA: Diagnosis not present

## 2017-10-13 DIAGNOSIS — E1149 Type 2 diabetes mellitus with other diabetic neurological complication: Secondary | ICD-10-CM

## 2017-10-13 DIAGNOSIS — E119 Type 2 diabetes mellitus without complications: Secondary | ICD-10-CM | POA: Diagnosis not present

## 2017-10-13 DIAGNOSIS — E1140 Type 2 diabetes mellitus with diabetic neuropathy, unspecified: Secondary | ICD-10-CM | POA: Insufficient documentation

## 2017-10-13 DIAGNOSIS — R5382 Chronic fatigue, unspecified: Secondary | ICD-10-CM

## 2017-10-13 MED ORDER — LOSARTAN POTASSIUM 25 MG PO TABS: 25.0000 mg | ORAL_TABLET | Freq: Every morning | ORAL | 2 refills | Status: DC

## 2017-10-13 MED ORDER — GLIMEPIRIDE 2 MG PO TABS: ORAL_TABLET | ORAL | 2 refills | Status: DC

## 2017-10-13 MED ORDER — FLUCONAZOLE 150 MG PO TABS: ORAL_TABLET | ORAL | 0 refills | Status: DC

## 2017-10-13 MED ORDER — METFORMIN HCL ER 500 MG PO TB24: 1000.0000 mg | ORAL_TABLET | Freq: Every day | ORAL | 2 refills | Status: DC

## 2017-10-13 MED ORDER — NEBIVOLOL HCL 20 MG PO TABS: 1.0000 | ORAL_TABLET | Freq: Two times a day (BID) | ORAL | 2 refills | Status: DC

## 2017-10-13 NOTE — Progress Notes (Signed)
Subjective:    Patient ID: [name], female    DOB: November 05, 1945, 72 y.o.   MRN: [id number]  Patient presents for Referral (would like to be seen by Dr. Buddy Duty for DM); Medication Management (states that Vania Rea has caused her to have increased vaginal irritation); and Fatigue (states that she doesn't feel like she should and she doesn't feel good)   When she moves around gets a heaviness in her chest when she moves around for many months. She feels more fatigued and out of breath. When she tries to do her activies After treatment for bronchitis still feels fatigued, still has some cough   She had recent labs diabetes uncontrolled  But TSH, B15, CBC, Metaobolic were unremarkable, nothing specific besides uncontrolled diabetes causing   Does not check CBG, last checked was  155, she has fear of needles  Currently on jardiance and metformin Has had vaginal discharge and itching using vagisil which helped some      Recent bronchitis- still feels fatigued.   GERD- has refux takiung prevacid no change.    Weight down 7lbs not eating much mostly soup    Review Of Systems:  GEN- denies fatigue, fever, weight loss,weakness, recent illness HEENT- denies eye drainage, change in vision, nasal discharge, CVS- denies chest pain, palpitations RESP- denies SOB, cough, wheeze ABD- denies N/V, change in stools, abd pain GU- denies dysuria, hematuria, dribbling, incontinence MSK- denies joint pain, muscle aches, injury Neuro- denies headache, dizziness, syncope, seizure activity       Objective:    BP 128/66   Pulse 88   Resp 14   Ht 5' 5.5" (1.664 m)   Wt 189 lb (85.7 kg)   SpO2 99%   BMI 30.97 kg/m  GEN- NAD, alert and oriented x3 HEENT- PERRL, EOMI, non injected sclera, pink conjunctiva, MMM, oropharynx clear Neck- Supple, no thyromegaly CVS- RRR, no murmur RESP-CTAB ABD-NABS,soft,NT,ND EXT- No edema Pulses- Radial, DP- 2+  ekg- NSR, fliped t wavers atnioer lateral  leads       Assessment & Plan:      Problem List Items Addressed This Visit      Unprioritized   Type II diabetes mellitus with neurological manifestations, uncontrolled ([place])    Diabetes is uncontrolled.  I think that some of this is contributing to her fatigue but now she is describing more of her chest pressure which she was not very clear with at the last visit.  Her EKG shows some nonspecific T wave changes but she does have risk factors for heart disease and with her age and then sent her to cardiology.  Chest x-ray was obtained as well she was recently treated for bronchitis this is clear.  Her other labs were fairly unremarkable.  She also would like to be referred to an endocrinologist we will go ahead and set this up.  For the yeast infection secondary to the Jardiance start we will give her Diflucan.  Encouraged her to increase her protein use supplements to help with her appetite. He is not checking her blood sugars as she has a fear of needles.  We will try to send in the nonstick system      Relevant Medications   losartan (COZAAR) 25 MG tablet   metFORMIN (GLUCOPHAGE-XR) 500 MG 24 hr tablet   glimepiride (AMARYL) 2 MG tablet   Diabetic neuropathy (HCC)    Trial of Bcomlex vitamin, does not want to start new med With her other symptoms needing to be  evaluated as well, prfer not to start neuropathic medication yet      Relevant Medications   losartan (COZAAR) 25 MG tablet   metFORMIN (GLUCOPHAGE-XR) 500 MG 24 hr tablet   glimepiride (AMARYL) 2 MG tablet   Other Relevant Orders   Ambulatory referral to Endocrinology   Chronic fatigue    Other Visit Diagnoses    Chest discomfort    -  Primary   Relevant Orders   EKG 12-Lead (Completed)   DG Chest 2 View (Completed)   Ambulatory referral to Cardiology   Acute bronchitis, unspecified organism       Relevant Orders   DG Chest 2 View (Completed)   Yeast infection       Relevant Medications   fluconazole  (DIFLUCAN) 150 MG tablet   Weight loss       Abnormal EKG       Relevant Orders   Ambulatory referral to Cardiology      Note: This dictation was prepared with Dragon dictation along with smaller phrase technology. Any transcriptional errors that result from this process are unintentional.

## 2017-10-13 NOTE — Patient Instructions (Addendum)
Referral to Endocrinology  Get B Complex vitamin for fatigue/neuropathy Get the chest xray  Referral to cardiology  Drink glucerna or low sugar ensure Increase your protein  F/U 3 months

## 2017-10-13 NOTE — Assessment & Plan Note (Signed)
Trial of Bcomlex vitamin, does not want to start new med With her other symptoms needing to be evaluated as well, prfer not to start neuropathic medication yet

## 2017-10-13 NOTE — Assessment & Plan Note (Addendum)
Diabetes is uncontrolled.  I think that some of this is contributing to her fatigue but now she is describing more of her chest pressure which she was not very clear with at the last visit.  Her EKG shows some nonspecific T wave changes but she does have risk factors for heart disease and with her age and then sent her to cardiology.  Chest x-ray was obtained as well she was recently treated for bronchitis this is clear.  Her other labs were fairly unremarkable.  She also would like to be referred to an endocrinologist we will go ahead and set this up.  For the yeast infection secondary to the Jardiance start we will give her Diflucan.  Encouraged her to increase her protein use supplements to help with her appetite. He is not checking her blood sugars as she has a fear of needles.  We will try to send in the nonstick system

## 2017-10-16 ENCOUNTER — Telehealth: Payer: Self-pay | Admitting: *Deleted

## 2017-10-16 MED ORDER — FREESTYLE LIBRE 14 DAY SENSOR MISC: 1.0000 | Freq: Every day | 11 refills | Status: DC

## 2017-10-16 MED ORDER — FREESTYLE LIBRE 14 DAY READER DEVI: 1.0000 | Freq: Every day | 1 refills | Status: DC

## 2017-10-16 NOTE — Telephone Encounter (Signed)
Received new orders from MD to order [facility].

## 2017-11-03 ENCOUNTER — Ambulatory Visit: Payer: Medicare Other | Admitting: Internal Medicine

## 2017-11-20 ENCOUNTER — Ambulatory Visit (HOSPITAL_COMMUNITY): Payer: Medicare Other | Admitting: Adult Health

## 2017-11-20 ENCOUNTER — Inpatient Hospital Stay (HOSPITAL_COMMUNITY): Payer: Medicare Other | Attending: Adult Health | Admitting: Adult Health

## 2017-11-20 ENCOUNTER — Encounter (HOSPITAL_COMMUNITY): Payer: Self-pay | Admitting: Adult Health

## 2017-11-20 ENCOUNTER — Other Ambulatory Visit: Payer: Self-pay

## 2017-11-20 VITALS — BP 126/64 | HR 77 | Temp 98.6°F | Resp 16 | Ht 66.0 in | Wt 189.4 lb

## 2017-11-20 DIAGNOSIS — Z1231 Encounter for screening mammogram for malignant neoplasm of breast: Secondary | ICD-10-CM

## 2017-11-20 DIAGNOSIS — R197 Diarrhea, unspecified: Secondary | ICD-10-CM | POA: Diagnosis not present

## 2017-11-20 DIAGNOSIS — M8580 Other specified disorders of bone density and structure, unspecified site: Secondary | ICD-10-CM

## 2017-11-20 DIAGNOSIS — R350 Frequency of micturition: Secondary | ICD-10-CM | POA: Insufficient documentation

## 2017-11-20 DIAGNOSIS — E119 Type 2 diabetes mellitus without complications: Secondary | ICD-10-CM | POA: Insufficient documentation

## 2017-11-20 DIAGNOSIS — Z853 Personal history of malignant neoplasm of breast: Secondary | ICD-10-CM

## 2017-11-20 DIAGNOSIS — Z780 Asymptomatic menopausal state: Secondary | ICD-10-CM

## 2017-11-20 DIAGNOSIS — G620 Drug-induced polyneuropathy: Secondary | ICD-10-CM | POA: Diagnosis not present

## 2017-11-20 NOTE — Progress Notes (Signed)
[place] [place], [place] [address]   CLINIC:  Medical Oncology/Hematology  PCP:  Nicole Rossetti, MD [address] [place] [address] [phone]   REASON FOR VISIT:  Follow-up for Stage IA (T1cN0M0) right breast cancer, ER+/PR+ (diagnosed in 1997)  CURRENT THERAPY: Observation   BRIEF ONCOLOGIC HISTORY:  -Right breast cancer measuring 1.1 x 1.2 cm, grade 2. s/p right mastectomy with regional axillary lymph node dissection on 10/17/1995; all 8 axillary lymph nodes were negative for metastatic disease. -She went on to have adjuvant chemotherapy consisting of Adriamycin/Cytoxan 4 cycles -Antiestrogen therapy with tamoxifen 5 years, completing therapy in 03/2001.   HISTORY OF PRESENT ILLNESS:  [name] presents for routine follow-up for her history of right breast cancer s/p mastectomy, adjuvant chemo, and 5 years of Tamoxifen therapy. She completed all treatment in 2002.     INTERVAL HISTORY:  [name] 72 y.o. female returns for routine follow-up for right breast cancer.   Here today unaccompanied.   Overall, she tells me she has been feeling very well. Appetite and energy levels both 100%.  She is getting over a recent episode of bronchitis, for which she was treated with course of antibiotics by her PCP.  Her shortness of breath and cough are much improved since that time.  She continues to struggle with diarrhea, which she attributes to her IBS. Also endorses peripheral neuropathy to her feet, which she attributes to her diabetes.  Reports urinary frequency with her Jardiance diabetes medication; she is frustrated by this. Denies any dysuria or hematuria.  She is supposed to see endocrinology for management of her difabetes.  She feels like her balance is changing; denies any frank dizziness or falls.   Denies any breast concerns today; no breast lumps, skin changes, or nipple discharge. Last mammogram of (L) breast was done in  11/2016 and was negative.    Appears DEXA scan was ordered at her last visit, but this exam was not done.    Otherwise, she is largely without other complaints today.      REVIEW OF SYSTEMS:  Review of Systems  Constitutional: Negative.   HENT:  Negative.   Eyes: Negative.   Respiratory:       Recently dx of bronchitis; improved per patient  Cardiovascular: Negative.   Gastrointestinal: Positive for diarrhea.  Endocrine: Negative.   Genitourinary: Positive for frequency. Negative for vaginal bleeding.   Musculoskeletal: Negative.   Neurological: Positive for numbness.  Hematological: Negative.   Psychiatric/Behavioral: Negative.      PAST MEDICAL/SURGICAL HISTORY:  Past Medical History:  Diagnosis Date  . Abdominal hernia 02/26/12   unrepaired  . Adenocarcinoma of breast ([place]) 1997   right / chemo + tamoxifen x 5 years   . Anemia   . Anxiety   . Blood transfusion 1980's  . Complication of anesthesia    has a hard time waking up; can't lay flat  . Degenerative disc disease, lumbar    pressing on L3 and L4  . Diabetes mellitus ([place]) 1989   Type 2 NIDDM; "cancer treatment gave me diabetes"  . DJD (degenerative joint disease) of lumbar spine   . Dysrhythmia    "irregular"  . Exertional dyspnea   . GERD (gastroesophageal reflux disease)    mann  . Heart murmur    "think I outgrew it"  . History of kidney stones   . History of stomach ulcers   . Hx: UTI (urinary  tract infection)   . Hyperlipidemia   . Hypersensitivity    in tongue  . Hypertension   . IBS (irritable bowel syndrome)   . Insomnia   . Kidney cysts    RT KIDNEY  . Kidney stones 2009   s/p lithotripsy  . Obesity   . PONV (postoperative nausea and vomiting)   . Shortness of breath    unable to lie flat   Past Surgical History:  Procedure Laterality Date  . ABDOMINAL HYSTERECTOMY  2002  . APPENDECTOMY    . BACK SURGERY    . BREAST BIOPSY     right  . CATARACT EXTRACTION W/ INTRAOCULAR LENS   IMPLANT, BILATERAL  2009-2010  . CESAREAN SECTION  1973; 1978; 1981  . COLONOSCOPY WITH PROPOFOL N/A 10/29/2016   Procedure: COLONOSCOPY WITH PROPOFOL;  Surgeon: Danie Binder, MD;  [facility];  Service: Endoscopy;  Laterality: N/A;  10:00 am  . CYSTOSCOPY Left 04/13/2014   Procedure: CYSTOSCOPY FLEXIBLE;  Surgeon: Ardis Hughs, MD;  Location: [facility];  Service: Urology;  Laterality: Left;  with STENT  . ESOPHAGOGASTRODUODENOSCOPY (EGD) WITH PROPOFOL N/A 10/29/2016   Procedure: ESOPHAGOGASTRODUODENOSCOPY (EGD) WITH PROPOFOL;  Surgeon: Danie Binder, MD;  [facility];  Service: Endoscopy;  Laterality: N/A;  . EYE SURGERY    . implant and screws put in the right lower jaw  11/2008   dental surgery  . kidney blockage  ?1990's   " major surgery ;put kidney on pump for awhile"  . LITHOTRIPSY     "several times"  . LUMBAR FUSION  08/2010  . MASTECTOMY  1997   right  . NEPHROLITHOTOMY Left 04/13/2014   Procedure: LEFT PERCUTANEOUS NEPHROLITHOTOMY WITH SURGEON ACCESS;  Surgeon: Ardis Hughs, MD;  Location: [facility];  Service: Urology;  Laterality: Left;  . OVARIAN CYST SURGERY    . PARATHYROIDECTOMY  02/26/2012   Procedure: PARATHYROIDECTOMY;  Surgeon: Ascencion Dike, MD;  Location: [facility];  Service: ENT;;  . Azzie [place] DILATION N/A 10/29/2016   Procedure: SAVORY DILATION;  Surgeon: Danie Binder, MD;  [facility];  Service: Endoscopy;  Laterality: N/A;  . [place] SURGERY  2011  . urological surgery for blocked ureter secondary to kidney stone       SOCIAL HISTORY:  Social History   Socioeconomic History  . Marital status: Married    Spouse name: Not on file  . Number of children: 3  . Years of education: Not on file  . Highest education level: Not on file  Social Needs  . Financial resource strain: Not on file  . Food insecurity - worry: Not on file  . Food insecurity - inability: Not on file  . Transportation needs - medical: Not on file  .  Transportation needs - non-medical: Not on file  Occupational History  . Occupation: retired in 2011  Tobacco Use  . Smoking status: Never Smoker  . Smokeless tobacco: Never Used  Substance and Sexual Activity  . Alcohol use: No    Alcohol/week: 0.0 oz    Comment: X2 DRINKS PER YEAR  . Drug use: No  . Sexual activity: Not Currently    Birth control/protection: Surgical  Other Topics Concern  . Not on file  Social History Narrative  . Not on file    FAMILY HISTORY:  Family History  Problem Relation Age of Onset  . Heart attack Mother   . Cancer Brother  Esophageal; smoker; deceased 25s  . Diabetes Daughter   . Leukemia Paternal Aunt        deceased 73s  . Pancreatic cancer Paternal Aunt        deceased 52  . Cancer Paternal Aunt        GI cancer; deceased 15s  . Cancer Cousin        kidney cancer; deceased 33; pat first cousin; daughter of aunt w/ leukemia  . Cancer Cousin        colon cancer @ 61; pat first cousin; son of aunt with GI cancer  . Anesthesia problems Neg Hx     CURRENT MEDICATIONS:  Outpatient Encounter Medications as of 11/20/2017  Medication Sig  . acetaminophen (TYLENOL) 325 MG tablet Take 325 mg by mouth every 6 (six) hours as needed for mild pain.   . Cholecalciferol (VITAMIN D3 PO) Take 1 tablet by mouth daily.  Marland Kitchen CINNAMON PO Take 1 capsule by mouth. Takes sometimes  . Continuous Blood Gluc Receiver (FREESTYLE LIBRE 14 DAY READER) DEVI 1 each by Does not apply route daily. Use as directed. Dx. E11.65.  Marland Kitchen Continuous Blood Gluc Sensor (FREESTYLE LIBRE 14 DAY SENSOR) MISC 1 each by Does not apply route daily. Use as directed. Dx: E11.65.  . empagliflozin (JARDIANCE) 25 MG TABS tablet Take 25 mg by mouth daily.  . fluconazole (DIFLUCAN) 150 MG tablet Take 1 tablet and repeat in 3 days  . glimepiride (AMARYL) 2 MG tablet TAKE 2 TABLETS BY MOUTH EVERY MORNING AND 1 TABLET AT BEDTIME.  . lansoprazole (PREVACID) 30 MG capsule 1 PO 30 MINS PRIOR TO  FIRST MEAL  . losartan (COZAAR) 25 MG tablet Take 1 tablet (25 mg total) by mouth every morning.  . metFORMIN (GLUCOPHAGE-XR) 500 MG 24 hr tablet Take 2 tablets (1,000 mg total) by mouth daily.  . Multiple Vitamin (MULITIVITAMIN WITH MINERALS) TABS Take 1 tablet by mouth daily.  . Nebivolol HCl (BYSTOLIC) 20 MG TABS Take 1 tablet (20 mg total) by mouth 2 (two) times daily.  Marland Kitchen Phenylephrine-Pheniramine-DM (THERAFLU COLD & COUGH PO) Take by mouth.  . tetrahydrozoline-zinc (VISINE-AC) 0.05-0.25 % ophthalmic solution Place 2 drops into both eyes 3 (three) times daily as needed (dry eyes).  Marland Kitchen ZINC-A-COLD MT Use as directed in the mouth or throat.   No facility-administered encounter medications on file as of 11/20/2017.     ALLERGIES:  Allergies  Allergen Reactions  . Demerol [Meperidine] Other (See Comments)    Lost consciousness  . Bentyl [Dicyclomine Hcl] Swelling  . Invokana [Canagliflozin] Hives  . Metformin And Related Diarrhea  . Protonix [Pantoprazole Sodium] Swelling  . Statins Other (See Comments)    myalgia  . Ace Inhibitors Cough     PHYSICAL EXAM:  ECOG Performance status: 1 - Symptomatic, but independent   Vitals:   11/20/17 1303  BP: 126/64  Pulse: 77  Resp: 16  Temp: 98.6 F (37 C)  SpO2: 100%   Filed Weights   11/20/17 1303  Weight: 189 lb 6.4 oz (85.9 kg)    Physical Exam  Constitutional: She is oriented to person, place, and time and well-developed, well-nourished, and in no distress.  HENT:  Head: Normocephalic.  Mouth/Throat: Oropharynx is clear and moist. No oropharyngeal exudate.  Eyes: Conjunctivae are normal. Pupils are equal, round, and reactive to light. No scleral icterus.  Neck: Normal range of motion. Neck supple.  Cardiovascular: Normal rate, regular rhythm and normal heart sounds.  Pulmonary/Chest: Effort normal and breath  sounds normal. No respiratory distress.    Abdominal: Soft. Bowel sounds are normal. She exhibits distension. There  is no tenderness.  Prominent xyphoid process d/t abd distension  Musculoskeletal: Normal range of motion. She exhibits no edema.  Lymphadenopathy:    She has no cervical adenopathy.       Right: No supraclavicular adenopathy present.       Left: No supraclavicular adenopathy present.  Neurological: She is alert and oriented to person, place, and time. No cranial nerve deficit. Gait normal.  Skin: Skin is warm and dry. No rash noted.  Psychiatric: Mood, memory, affect and judgment normal.  Nursing note and vitals reviewed.    LABORATORY DATA:  I have reviewed the labs as listed.  CBC    Component Value Date/Time   WBC 8.6 09/24/2017 1638   RBC 4.59 09/24/2017 1638   HGB 12.2 09/24/2017 1638   HCT 36.9 09/24/2017 1638   PLT 199 09/24/2017 1638   MCV 80.4 09/24/2017 1638   MCH 26.6 (L) 09/24/2017 1638   MCHC 33.1 09/24/2017 1638   RDW 14.9 09/24/2017 1638   LYMPHSABS 3.2 11/18/2016 1242   MONOABS 0.6 11/18/2016 1242   EOSABS 0.2 11/18/2016 1242   BASOSABS 0.0 11/18/2016 1242   CMP Latest Ref Rng & Units 09/24/2017 11/18/2016 10/24/2016  Glucose 65 - 99 mg/dL 215(H) 230(H) 247(H)  BUN 7 - 25 mg/dL 5(L) 9 11  Creatinine 0.60 - 0.93 mg/dL 0.95(H) 0.91 0.90  Sodium 135 - 146 mmol/L 137 137 136  Potassium 3.5 - 5.3 mmol/L 3.6 3.4(L) 4.0  Chloride 98 - 110 mmol/L 101 104 103  CO2 20 - 32 mmol/L 26 26 24   Calcium 8.6 - 10.4 mg/dL 8.7 8.9 8.9  Total Protein 6.1 - 8.1 g/dL 6.8 7.8 -  Total Bilirubin 0.2 - 1.2 mg/dL 0.4 0.5 -  Alkaline Phos 38 - 126 U/L - 48 -  AST 10 - 35 U/L 23 26 -  ALT 6 - 29 U/L 18 23 -    PENDING LABS:    DIAGNOSTIC IMAGING:  Last left breast screening mammogram: 12/03/16 CLINICAL DATA:  72 year old female with intermittent nonfocal pain involving the left breast, primarily in the axilla and radiating over the top of the left breast. Remote history of right breast cancer post mastectomy in 1997.  EXAM: 2D DIGITAL DIAGNOSTIC UNILATERAL LEFT MAMMOGRAM  WITH CAD AND ADJUNCT TOMO  COMPARISON:  Previous exam(s).  ACR Breast Density Category b: There are scattered areas of fibroglandular density.  FINDINGS: No suspicious masses or calcifications are seen in the left breast. There is no mammographic evidence of malignancy in the left breast.  Mammographic images were processed with CAD.  IMPRESSION: No mammographic evidence of malignancy in the left.  RECOMMENDATION: 1. Recommend further management of nonfocal left breast pain be based on clinical assessment.  2.  Screening mammogram in one year.(Code:SM-B-01Y)  I have discussed the findings and recommendations with the patient. Results were also provided in writing at the conclusion of the visit. If applicable, a reminder letter will be sent to the patient regarding the next appointment.  BI-RADS CATEGORY  1: Negative.   Electronically Signed   By: Everlean Alstrom M.D.   On: 12/03/2016 15:33   PATHOLOGY:     ASSESSMENT & PLAN:   Stage IA (T1cN0M) right breast cancer, ER/PR+, diagnosed in 1997:  -s/p mastectomy, followed by adjuvant chemo with Adriamycin/Cytoxan x 4 cycles, then Tamoxifen x 5 years. Completed Tamoxifen therapy in 2002.  -Last  unilateral (L) breast screening mammogram done on 12/03/16 and negative. She will be due for annual exam in 11/2017; orders placed today.  -Clinical breast exam performed today and benign.  -She is now 20+ years out from her original breast cancer diagnosis without evidence of recurrence; this is extremely favorable. -Return to cancer center in 1 year for follow-up; no labs necessary.   Bone health:  -Last DEXA scan was 09/28/13 showed osteopenia; T-score -2.1.  -DEXA scan was ordered at her last visit, but was not done. Orders re-entered; will try to coordinate with upcoming mammogram in 11/2017 if able.  -Recommended calcium/vitmain D supplementation and increase weight-bearing exercises as tolerated.   Health  maintenance/Wellness promotion:  -Vaccinations reportedly up-to-date.  -Last colonoscopy 10/29/16 with Dr. Oneida Alar.  -Recommended continued follow-up with PCP for continued health/wellness and comorbid condition management.         Dispo:  -Mammogram and DEXA scan sometime in 11/2017.  -Return to cancer center in 1 year for follow-up; no oncologic reason for labs.     All questions were answered to patient's stated satisfaction. Encouraged patient to call with any new concerns or questions before her next visit to the cancer center and we can certain see her sooner, if needed.      Orders placed this encounter:  Orders Placed This Encounter  Procedures  . MM SCREENING BREAST TOMO UNI L  . [place], NP [place] [phone]

## 2017-11-20 NOTE — Patient Instructions (Signed)
[place] at [facility] Discharge Instructions  RECOMMENDATIONS MADE BY THE CONSULTANT AND ANY TEST RESULTS WILL BE SENT TO YOUR REFERRING PHYSICIAN.  You were seen today by [name] NP. Your mammogram and DEXA scan are due in March of this year. Return in 1 year for follow up.   Thank you for choosing [place] at [facility] to provide your oncology and hematology care.  To afford each patient quality time with our provider, please arrive at least 15 minutes before your scheduled appointment time.    If you have a lab appointment with the [facility] please come in thru the  Main Entrance and check in at the main information desk  You need to re-schedule your appointment should you arrive 10 or more minutes late.  We strive to give you quality time with our providers, and arriving late affects you and other patients whose appointments are after yours.  Also, if you no show three or more times for appointments you may be dismissed from the clinic at the providers discretion.     Again, thank you for choosing [facility].  Our hope is that these requests will decrease the amount of time that you wait before being seen by our physicians.       _____________________________________________________________  Should you have questions after your visit to [facility], please contact our office at [phone] between the hours of 8:30 a.m. and 4:30 p.m.  Voicemails left after 4:30 p.m. will not be returned until the following business day.  For prescription refill requests, have your pharmacy contact our office.       Resources For Cancer Patients and their Caregivers ? American Cancer Society: Can assist with transportation, wigs, general needs, runs Look Good Feel Better.        ([phone] 334-2589 ? Cancer Care: Provides financial assistance, online support groups, medication/co-pay assistance.   1-800-813-HOPE (484)469-1392) ? [place] Assists [place] cancer patients and their families through emotional , educational and financial support.  (440) 650-8996 ? [place] DSS Where to apply for food stamps, Medicaid and utility assistance. (848)388-4334 ? [employer]: Transportation to medical appointments. 319-402-2415 ? Social Security Administration: May apply for disability if have a Stage IV cancer. 531-836-1617 803-696-8662 ? [employer], Disability and Transit Services: Assists with nutrition, care and transit needs. [place] Support Programs: @10RELATIVEDAYS @ > Cancer Support Group  2nd Tuesday of the month 1pm-2pm, Journey Room  > Creative Journey  3rd Tuesday of the month 1130am-1pm, Journey Room  > Look Good Feel Better  1st Wednesday of the month 10am-12 noon, Journey Room (Call [facility] to register [phone])

## 2017-11-22 ENCOUNTER — Encounter (HOSPITAL_COMMUNITY): Payer: Self-pay | Admitting: Adult Health

## 2017-12-04 ENCOUNTER — Encounter: Payer: Self-pay | Admitting: Internal Medicine

## 2017-12-18 ENCOUNTER — Other Ambulatory Visit (HOSPITAL_COMMUNITY): Payer: Medicare Other

## 2017-12-18 ENCOUNTER — Ambulatory Visit: Payer: Medicare Other | Admitting: Endocrinology

## 2017-12-18 ENCOUNTER — Encounter: Payer: Self-pay | Admitting: Endocrinology

## 2017-12-18 ENCOUNTER — Ambulatory Visit (HOSPITAL_COMMUNITY): Payer: Medicare Other

## 2017-12-18 VITALS — BP 130/80 | HR 81 | Ht 66.0 in | Wt 191.0 lb

## 2017-12-18 DIAGNOSIS — E669 Obesity, unspecified: Secondary | ICD-10-CM

## 2017-12-18 DIAGNOSIS — E663 Overweight: Secondary | ICD-10-CM | POA: Insufficient documentation

## 2017-12-18 DIAGNOSIS — E1142 Type 2 diabetes mellitus with diabetic polyneuropathy: Secondary | ICD-10-CM

## 2017-12-18 DIAGNOSIS — E1165 Type 2 diabetes mellitus with hyperglycemia: Secondary | ICD-10-CM

## 2017-12-18 LAB — COMPREHENSIVE METABOLIC PANEL
ALBUMIN: 4.2 g/dL (ref 3.5–5.2)
ALT: 20 U/L (ref 0–35)
AST: 21 U/L (ref 0–37)
Alkaline Phosphatase: 53 U/L (ref 39–117)
BUN: 6 mg/dL (ref 6–23)
CHLORIDE: 104 meq/L (ref 96–112)
CO2: 29 meq/L (ref 19–32)
CREATININE: 1.11 mg/dL (ref 0.40–1.20)
Calcium: 10.4 mg/dL (ref 8.4–10.5)
GFR: 62.34 mL/min (ref >60.00)
GLUCOSE: 237 mg/dL — AB (ref 70–99)
POTASSIUM: 4 meq/L (ref 3.5–5.1)
SODIUM: 140 meq/L (ref 135–145)
Total Bilirubin: 0.5 mg/dL (ref 0.2–1.2)
Total Protein: 8.2 g/dL (ref 6.0–8.3)

## 2017-12-18 LAB — HEMOGLOBIN A1C: Hgb A1c MFr Bld: 7.9 % — ABNORMAL HIGH (ref 4.6–6.5)

## 2017-12-18 MED ORDER — DULAGLUTIDE 0.75 MG/0.5ML ~~LOC~~ SOAJ: SUBCUTANEOUS | 0 refills | Status: DC

## 2017-12-18 NOTE — Patient Instructions (Signed)
Start walking daily   Check blood sugars on waking up weekly   Also check blood sugars about 2 hours after a meal and do this after different meals by rotation  Recommended blood sugar levels on waking up is 90-130 and about 2 hours after meal is 130-160  Please bring your blood sugar monitor to each visit, thank you  Start TRULICITYwith the pen as shown once weekly on the same day of the week.  You may inject in the stomach, thigh or arm as indicated in the brochure given.   You will feel fullness of the stomach with starting the medication and should try to keep the portions at meals small.  You may experience nausea in the first few days which usually gets better over time   If any questions or concerns are present call the office or the  [place] at [phone].   Also visit [email or web address] website for more useful information  Metformin at supper and bedtime

## 2017-12-18 NOTE — Progress Notes (Signed)
Patient ID: [name], female   DOB: 01-23-1946, 72 y.o.   MRN: [id number]          Reason for Appointment: Consultation for Type 2 Diabetes  Referring physician: [facility]   History of Present Illness:          Date of diagnosis of type 2 diabetes mellitus:        Background history: She has been on metformin since diagnosis Also has been on Amaryl or glipizide in the past Has had only infrequent A1c was done according to the current records that are available, A1c has been persistently high except in 2014 when it was 7.2 She thinks she had a rash for Januvia and this was stopped in 2016  Recent history:    Non-insulin hypoglycemic drugs the patient is taking are: metformin ER 1g, Amaryl 4 mg in the morning and 2 mg at bedtime  Current management, blood sugar patterns and problems identified:  She checks her blood sugars only very rarely when she does not feel good because she does not like to stick her finger, she was also prescribed the freestyle Libre sensor but this was not covered by her insurance  She said that she is not able to follow her diet because of various GI problems  She tends to eat snacks and drink regular soft drinks frequently and at least one serving of 12 ounces of Coke daily  She tends to have soft drinks when she is anxious and does not like to eat regular food much because of getting diarrhea from this especially at breakfast or lunch  She is not motivated to exercise or do any walking  She was started on Jardiance in January but she does not like the side effects of frequent urination and recurrent vaginal candidiasis with this          Side effects from medications have been: Diarrhea from metformin, rash from Januvia, candidiasis from Jardiance  Compliance with the medical regimen: Poor Hypoglycemia: None   Glucose monitoring:  done <1 times a day         Glucometer:  Unknown     Blood Glucose readings not available  Self-care: The diet  that the patient has been following is: None, she periodically will eat fried food, has snacks like popcorn  Does not drink sugar free drinks    Typical meal intake: Breakfast is   sometimes boost or iced coffee drink              Dietician visit, most recent: 2017               Exercise: None    Weight history:Maximum weight 214  Wt Readings from Last 3 Encounters:  12/18/17 191 lb (86.6 kg)  11/20/17 189 lb 6.4 oz (85.9 kg)  10/13/17 189 lb (85.7 kg)    Glycemic control:   Lab Results  Component Value Date   HGBA1C 9.0 (H) 09/24/2017   HGBA1C 7.9 (H) 07/03/2016   HGBA1C 9.8 (H) 01/29/2016   Lab Results  Component Value Date   MICROALBUR 11.3 01/29/2016   LDLCALC 98 09/24/2017   CREATININE 0.95 (H) 09/24/2017   Lab Results  Component Value Date   MICRALBCREAT 46 (H) 01/29/2016    No results found for: FRUCTOSAMINE    Allergies as of 12/18/2017      Reactions   Demerol [meperidine] Other (See Comments)   Lost consciousness   Bentyl [dicyclomine Hcl] Swelling   Invokana [canagliflozin] Hives  Metformin And Related Diarrhea   Protonix [pantoprazole Sodium] Swelling   Statins Other (See Comments)   myalgia   Ace Inhibitors Cough      Medication List        Accurate as of 12/18/17  2:05 PM. Always use your most recent med list.          acetaminophen 325 MG tablet Commonly known as:  TYLENOL Take 325 mg by mouth every 6 (six) hours as needed for mild pain.   CINNAMON PO Take 1 capsule by mouth. Takes sometimes   empagliflozin 25 MG Tabs tablet Commonly known as:  JARDIANCE Take 25 mg by mouth daily.   fluconazole 150 MG tablet Commonly known as:  DIFLUCAN Take 1 tablet and repeat in 3 days   FREESTYLE LIBRE [place] 1 each by Does not apply route daily. Use as directed. Dx. E11.65.   FREESTYLE LIBRE 14 DAY SENSOR Misc 1 each by Does not apply route daily. Use as directed. Dx: E11.65.   glimepiride 2 MG tablet Commonly known as:   AMARYL TAKE 2 TABLETS BY MOUTH EVERY MORNING AND 1 TABLET AT BEDTIME.   lansoprazole 30 MG capsule Commonly known as:  PREVACID 1 PO 30 MINS PRIOR TO FIRST MEAL   losartan 25 MG tablet Commonly known as:  COZAAR Take 1 tablet (25 mg total) by mouth every morning.   metFORMIN 500 MG 24 hr tablet Commonly known as:  GLUCOPHAGE-XR Take 2 tablets (1,000 mg total) by mouth daily.   multivitamin with minerals Tabs tablet Take 1 tablet by mouth daily.   Nebivolol HCl 20 MG Tabs Commonly known as:  BYSTOLIC Take 1 tablet (20 mg total) by mouth 2 (two) times daily.   tetrahydrozoline-zinc 0.05-0.25 % ophthalmic solution Commonly known as:  VISINE-AC Place 2 drops into both eyes 3 (three) times daily as needed (dry eyes).   THERAFLU COLD & COUGH PO Take by mouth.   VITAMIN D3 PO Take 1 tablet by mouth daily.   ZINC-A-COLD MT Use as directed in the mouth or throat.       Allergies:  Allergies  Allergen Reactions  . Demerol [Meperidine] Other (See Comments)    Lost consciousness  . Bentyl [Dicyclomine Hcl] Swelling  . Invokana [Canagliflozin] Hives  . Metformin And Related Diarrhea  . Protonix [Pantoprazole Sodium] Swelling  . Statins Other (See Comments)    myalgia  . Ace Inhibitors Cough    Past Medical History:  Diagnosis Date  . Abdominal hernia 02/26/12   unrepaired  . 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 Anemia   . Anxiety   . Blood transfusion 1980's  . Complication of anesthesia    has a hard time waking up; can't lay flat  . Degenerative disc disease, lumbar    pressing on L3 and L4  . Diabetes mellitus ([place]) 1989   Type 2 NIDDM; "cancer treatment gave me diabetes"  . DJD (degenerative joint disease) of lumbar spine   . Dysrhythmia    "irregular"  . Exertional dyspnea   . GERD (gastroesophageal reflux disease)    mann  . Heart murmur    "think I outgrew it"  . History of kidney stones   . History of  stomach ulcers   . Hx: UTI (urinary tract infection)   . Hyperlipidemia   . Hypersensitivity    in tongue  . Hypertension   . IBS (irritable bowel  syndrome)   . Insomnia   . Kidney cysts    RT KIDNEY  . Kidney stones 2009   s/p lithotripsy  . Obesity   . PONV (postoperative nausea and vomiting)   . Shortness of breath    unable to lie flat    Past Surgical History:  Procedure Laterality Date  . ABDOMINAL HYSTERECTOMY  2002  . APPENDECTOMY    . BACK SURGERY    . BREAST BIOPSY     right  . CATARACT EXTRACTION W/ INTRAOCULAR LENS  IMPLANT, BILATERAL  2009-2010  . CESAREAN SECTION  1973; 1978; 1981  . COLONOSCOPY WITH PROPOFOL N/A 10/29/2016   Procedure: COLONOSCOPY WITH PROPOFOL;  Surgeon: Danie Binder, MD;  [facility];  Service: Endoscopy;  Laterality: N/A;  10:00 am  . CYSTOSCOPY Left 04/13/2014   Procedure: CYSTOSCOPY FLEXIBLE;  Surgeon: Ardis Hughs, MD;  Location: [facility];  Service: Urology;  Laterality: Left;  with STENT  . ESOPHAGOGASTRODUODENOSCOPY (EGD) WITH PROPOFOL N/A 10/29/2016   Procedure: ESOPHAGOGASTRODUODENOSCOPY (EGD) WITH PROPOFOL;  Surgeon: Danie Binder, MD;  [facility];  Service: Endoscopy;  Laterality: N/A;  . EYE SURGERY    . implant and screws put in the right lower jaw  11/2008   dental surgery  . kidney blockage  ?1990's   " major surgery ;put kidney on pump for awhile"  . LITHOTRIPSY     "several times"  . LUMBAR FUSION  08/2010  . MASTECTOMY  1997   right  . NEPHROLITHOTOMY Left 04/13/2014   Procedure: LEFT PERCUTANEOUS NEPHROLITHOTOMY WITH SURGEON ACCESS;  Surgeon: Ardis Hughs, MD;  Location: [facility];  Service: Urology;  Laterality: Left;  . OVARIAN CYST SURGERY    . PARATHYROIDECTOMY  02/26/2012   Procedure: PARATHYROIDECTOMY;  Surgeon: Ascencion Dike, MD;  Location: [place] [facility];  Service: ENT;;  . Azzie [place] DILATION N/A 10/29/2016   Procedure: SAVORY DILATION;  Surgeon: Danie Binder, MD;  [facility];   Service: Endoscopy;  Laterality: N/A;  . [place] SURGERY  2011  . urological surgery for blocked ureter secondary to kidney stone      Family History  Problem Relation Age of Onset  . Heart attack Mother   . Cancer Brother        Esophageal; smoker; deceased 69s  . Diabetes Daughter   . Leukemia Paternal Aunt        deceased 50s  . Pancreatic cancer Paternal Aunt        deceased 72  . Cancer Paternal Aunt        GI cancer; deceased 68s  . Cancer Cousin        kidney cancer; deceased 65; pat first cousin; daughter of aunt w/ leukemia  . Cancer Cousin        colon cancer @ 57; pat first cousin; son of aunt with GI cancer  . Anesthesia problems Neg Hx     Social History:  reports that she has never smoked. She has never used smokeless tobacco. She reports that she does not drink alcohol or use drugs.   Review of Systems  Constitutional: Negative for weight gain and malaise.  HENT: Negative for hoarseness.   Eyes: Negative for blurred vision.  Respiratory: Negative for shortness of breath.   Cardiovascular: Negative for leg swelling.  Gastrointestinal: Positive for diarrhea.  Endocrine: Negative for fatigue.  Genitourinary: Positive for frequency and nocturia.  Musculoskeletal: Negative for joint pain.  Skin: Negative for rash.  Neurological: Positive for tingling.       Occasional sharp twinges of pains  Psychiatric/Behavioral: Negative for insomnia.     Lipid history: Not on treatment, is statin intolerant    Lab Results  Component Value Date   CHOL 180 09/24/2017   HDL 46 (L) 09/24/2017   LDLCALC 98 09/24/2017   TRIG 239 (H) 09/24/2017   CHOLHDL 3.9 09/24/2017           Hypertension: Blood pressure treated with losartan and Bystolic  BP Readings from Last 3 Encounters:  12/18/17 130/80  11/20/17 126/64  10/13/17 128/66    Most recent eye exam was in 3/18  Most recent foot exam: 3/19    LABS:  No visits with results within 1 Week(s) from this visit.   Latest known visit with results is:  Office Visit on 09/24/2017  Component Date Value Ref Range Status  . Source: 09/24/2017 NASOPHARYNX   Final  . INFLUENZA A ANTIGEN 09/24/2017 NOT DETECTED  NOT DETECT Final  . INFLUENZA B ANTIGEN 09/24/2017 NOT DETECTED  NOT DETECT Final  . Glucose, Bld 09/24/2017 215* 65 - 99 mg/dL Final   Comment: .            Fasting reference interval . For someone without known diabetes, a glucose value >125 mg/dL indicates that they may have diabetes and this should be confirmed with a follow-up test. .   . BUN 09/24/2017 5* 7 - 25 mg/dL Final  . Creat 09/24/2017 0.95* 0.60 - 0.93 mg/dL Final   Comment: For patients >71 years of age, the reference limit for Creatinine is approximately 13% higher for people identified as African-American. .   Havery Moros Ratio 09/24/2017 5* 6 - 22 (calc) Final  . Sodium 09/24/2017 137  135 - 146 mmol/L Final  . Potassium 09/24/2017 3.6  3.5 - 5.3 mmol/L Final  . Chloride 09/24/2017 101  98 - 110 mmol/L Final  . CO2 09/24/2017 26  20 - 32 mmol/L Final  . Calcium 09/24/2017 8.7  8.6 - 10.4 mg/dL Final  . Total Protein 09/24/2017 6.8  6.1 - 8.1 g/dL Final  . Albumin 09/24/2017 3.8  3.6 - 5.1 g/dL Final  . Globulin 09/24/2017 3.0  1.9 - 3.7 g/dL (calc) Final  . AG Ratio 09/24/2017 1.3  1.0 - 2.5 (calc) Final  . Total Bilirubin 09/24/2017 0.4  0.2 - 1.2 mg/dL Final  . Alkaline phosphatase (APISO) 09/24/2017 55  33 - 130 U/L Final  . AST 09/24/2017 23  10 - 35 U/L Final  . ALT 09/24/2017 18  6 - 29 U/L Final  . Hgb A1c MFr Bld 09/24/2017 9.0* <5.7 % of total Hgb Final   Comment: For someone without known diabetes, a hemoglobin A1c value of 6.5% or greater indicates that they may have  diabetes and this should be confirmed with a follow-up  test. . For someone with known diabetes, a value <7% indicates  that their diabetes is well controlled and a value  greater than or equal to 7% indicates suboptimal  control.  A1c targets should be individualized based on  duration of diabetes, age, comorbid conditions, and  other considerations. . Currently, no consensus exists regarding use of hemoglobin A1c for diagnosis of diabetes for children. .   . Mean Plasma Glucose 09/24/2017 212  (calc) Final  . eAG (mmol/L) 09/24/2017 11.7  (calc) Final  . Cholesterol 09/24/2017 180  <200 mg/dL Final  . HDL 09/24/2017 46* >50 mg/dL Final  .  Triglycerides 09/24/2017 239* <150 mg/dL Final  . LDL Cholesterol (Calc) 09/24/2017 98  mg/dL (calc) Final   Comment: Reference range: <100 . Desirable range <100 mg/dL for primary prevention;   <70 mg/dL for patients with CHD or diabetic patients  with > or = 2 CHD risk factors. Marland Kitchen LDL-C is now calculated using the Martin-Hopkins  calculation, which is a validated novel method providing  better accuracy than the Friedewald equation in the  estimation of LDL-C.  Cresenciano Genre et al. Annamaria Helling. 8841;660(63): 2061-2068  (http://education.[employer].com/faq/FAQ164)   . Total CHOL/HDL Ratio 09/24/2017 3.9  <5.0 (calc) Final  . Non-HDL Cholesterol (Calc) 09/24/2017 134* <130 mg/dL (calc) Final   Comment: For patients with diabetes plus 1 major ASCVD risk  factor, treating to a non-HDL-C goal of <100 mg/dL  (LDL-C of <70 mg/dL) is considered a therapeutic  option.   . TSH 09/24/2017 0.81  0.40 - 4.50 mIU/L Final  . WBC 09/24/2017 8.6  3.8 - 10.8 Thousand/uL Final  . RBC 09/24/2017 4.59  3.80 - 5.10 Million/uL Final  . Hemoglobin 09/24/2017 12.2  11.7 - 15.5 g/dL Final  . HCT 09/24/2017 36.9  35.0 - 45.0 % Final  . MCV 09/24/2017 80.4  80.0 - 100.0 fL Final  . MCH 09/24/2017 26.6* 27.0 - 33.0 pg Final  . MCHC 09/24/2017 33.1  32.0 - 36.0 g/dL Final  . RDW 09/24/2017 14.9  11.0 - 15.0 % Final  . Platelets 09/24/2017 199  140 - 400 Thousand/uL Final  . Vitamin B-12 09/24/2017 632  200 - 1,100 pg/mL Final  . TEST NAME: 09/24/2017 VITAMIN B12   Final  . TEST CODE: 09/24/2017  [id number]   Final  . CLIENT CONTACT: 09/24/2017 Learta Codding   Final  . REPORT ALWAYS MESSAGE SIGNATURE 09/24/2017    Final   Comment: . The laboratory testing on this patient was verbally requested or confirmed by the ordering physician or his or her authorized representative after contact with an employee of [employer]. Federal regulations require that we maintain on file written authorization for all laboratory testing.  Accordingly we are asking that the ordering physician or his or her authorized representative sign a copy of this report and promptly return it to the client service representative. . . Signature:____________________________________________________ . Please fax this signed page to [phone] or return it via your [employer] courier.     Physical Examination:  BP 130/80 (BP Location: Left Arm, Patient Position: Sitting, Cuff Size: Normal)   Pulse 81   Ht 5\' 6"  (1.676 m)   Wt 191 lb (86.6 kg)   SpO2 96%   BMI 30.83 kg/m   GENERAL:         Patient has generalized obesity.    HEENT:         Eye exam shows normal external appearance.  Fundus exam shows no retinopathy.  Oral exam shows normal mucosa .  NECK:   There is no lymphadenopathy Thyroid is not enlarged and no nodules felt.  Carotids are normal to palpation and no bruit heard  LUNGS:         Chest is symmetrical. Lungs are clear to auscultation.Marland Kitchen   HEART:         Heart sounds:  S1 and S2 are normal. No murmur or click heard., no S3 or S4.   ABDOMEN:   There is no distention present. Liver and spleen are not palpable. No other mass or tenderness present.    NEUROLOGICAL:   Ankle jerks  are absent bilaterally.    Diabetic Foot Exam - Simple   Simple Foot Form Diabetic Foot exam was performed with the following findings:  Yes   Visual Inspection No deformities, no ulcerations, no other skin breakdown bilaterally:  Yes Sensation Testing Intact to touch and monofilament testing  bilaterally:  Yes Pulse Check Posterior Tibialis and Dorsalis pulse intact bilaterally:  Yes Comments            Vibration sense is moderately reduced in distal first toes. MUSCULOSKELETAL:  There is no swelling or deformity of the peripheral joints.     EXTREMITIES:     There is no edema.  SKIN:       No rash or lesions of concern.        ASSESSMENT:  Diabetes type 2, persistently uncontrolled  Last A1c 9% in January  Currently on a regimen of metformin, Amaryl and Jardiance She is intolerant of Jardiance and unable to continue on this long-term Unable to increase her Metformin to be on 1000 mg a day because of tendency to diarrhea  She also has difficulty losing weight Her main difficulty is poor compliance with diet with excessive carbohydrate intake, unbalanced meals and regular use of sweet drinks She has no motivation to exercise also Has needle phobia and will not check her blood sugars at home  She is a good candidate for a GLP-1 drug which would help regulate her blood sugars more effectively and hopefully will have minimal GI side effects  Complications of diabetes: Mild neuropathic symptoms  PLAN:    She will stop Jardiance because of side effects  Discussed with the patient the nature of GLP-1 drugs, the action on various organ systems, how they benefit blood glucose control, as well as the benefit of weight loss and  increase satiety . Explained possible side effects, particularly nausea and vomiting that usually resolve over time; discussed safety information in package insert. Demonstrated the medication injection device and injection technique to the patient.  Showed patient where to inject the medication. Since the patient is somewhat needle phobic she will try to do this starting on Saturday with the help of her daughter who is a nurse  To start with 0.75 mg dosage weekly for the first 4 weeks She will be reevaluated in 1 month Patient brochure on Trulicity  given  For better tolerability she can take metformin 1 tablet with her main meal in the afternoon and 1 at bedtime Discussed that potentially blood sugars may start coming down and if she has any symptoms of hypoglycemia with weakness, shakiness and sweating she will need to test her sugar and drink 6 ounces of Coke at that time Also notify us if she starts having low sugar symptoms  Encourage her to start walking at least 10-15 minutes daily She was offered consultation with a dietitian but she refuses She can at least try to cut back on the amounts of regular soft drinks that she is consuming   Patient Instructions  Start walking daily   Check blood sugars on waking up weekly   Also check blood sugars about 2 hours after a meal and do this after different meals by rotation  Recommended blood sugar levels on waking up is 90-130 and about 2 hours after meal is 130-160  Please bring your blood sugar monitor to each visit, thank you  Start TRULICITYwith the pen as shown once weekly on the same day of the week.  You may inject in the  stomach, thigh or arm as indicated in the brochure given.   You will feel fullness of the stomach with starting the medication and should try to keep the portions at meals small.  You may experience nausea in the first few days which usually gets better over time   If any questions or concerns are present call the office or the  [place] at [phone].   Also visit [email or web address] website for more useful information  Metformin at supper and bedtime       Consultation note has been sent to the referring physician  Elayne Snare 12/18/2017, 2:05 PM   Note: This office note was prepared with Dragon voice recognition system technology. Any transcriptional errors that result from this process are unintentional.

## 2017-12-22 ENCOUNTER — Ambulatory Visit (HOSPITAL_COMMUNITY)
Admission: RE | Admit: 2017-12-22 | Discharge: 2017-12-22 | Disposition: A | Discharge status: Home / Self Care | Payer: Medicare Other | Source: Ambulatory Visit | Attending: Adult Health | Admitting: Adult Health

## 2017-12-22 DIAGNOSIS — Z1231 Encounter for screening mammogram for malignant neoplasm of breast: Secondary | ICD-10-CM | POA: Insufficient documentation

## 2017-12-22 DIAGNOSIS — M8589 Other specified disorders of bone density and structure, multiple sites: Secondary | ICD-10-CM | POA: Insufficient documentation

## 2017-12-22 DIAGNOSIS — M8580 Other specified disorders of bone density and structure, unspecified site: Secondary | ICD-10-CM | POA: Insufficient documentation

## 2017-12-22 DIAGNOSIS — Z780 Asymptomatic menopausal state: Secondary | ICD-10-CM | POA: Insufficient documentation

## 2017-12-25 ENCOUNTER — Ambulatory Visit: Payer: Medicare Other | Admitting: Internal Medicine

## 2018-01-05 ENCOUNTER — Other Ambulatory Visit: Payer: Self-pay | Admitting: Endocrinology

## 2018-01-12 ENCOUNTER — Encounter: Payer: Self-pay | Admitting: Family Medicine

## 2018-01-12 ENCOUNTER — Other Ambulatory Visit: Payer: Self-pay

## 2018-01-12 ENCOUNTER — Ambulatory Visit: Payer: Medicare Other | Admitting: Family Medicine

## 2018-01-12 VITALS — BP 122/64 | HR 86 | Temp 98.1°F | Resp 14 | Ht 66.0 in | Wt 188.0 lb

## 2018-01-12 DIAGNOSIS — H1031 Unspecified acute conjunctivitis, right eye: Secondary | ICD-10-CM

## 2018-01-12 DIAGNOSIS — J309 Allergic rhinitis, unspecified: Secondary | ICD-10-CM | POA: Diagnosis not present

## 2018-01-12 MED ORDER — MONTELUKAST SODIUM 10 MG PO TABS: 10.0000 mg | ORAL_TABLET | Freq: Every day | ORAL | 3 refills | Status: DC

## 2018-01-12 MED ORDER — POLYMYXIN B-TRIMETHOPRIM 10000-0.1 UNIT/ML-% OP SOLN: 2.0000 [drp] | Freq: Four times a day (QID) | OPHTHALMIC | 0 refills | Status: AC

## 2018-01-12 MED ORDER — FLUTICASONE PROPIONATE 50 MCG/ACT NA SUSP: 2.0000 | Freq: Every day | NASAL | 6 refills | Status: DC

## 2018-01-12 NOTE — Patient Instructions (Signed)
Start allergy meds AND eye drops today!   Bacterial Conjunctivitis Bacterial conjunctivitis is an infection of the clear membrane that covers the white part of your eye and the inner surface of your eyelid (conjunctiva). When the blood vessels in your conjunctiva become inflamed, your eye becomes red or pink, and it will probably feel itchy. Bacterial conjunctivitis spreads very easily from person to person (is contagious). It also spreads easily from one eye to the other eye. What are the causes? This condition is caused by several common bacteria. You may get the infection if you come into close contact with another person who is infected. You may also come into contact with items that are contaminated with the bacteria, such as a face towel, contact lens solution, or eye makeup. What increases the risk? This condition is more likely to develop in people who:  Are exposed to other people who have the infection.  Wear contact lenses.  Have a sinus infection.  Have had a recent eye injury or surgery.  Have a weak body defense system (immune system).  Have a medical condition that causes dry eyes.  What are the signs or symptoms? Symptoms of this condition include:  Eye redness.  Tearing or watery eyes.  Itchy eyes.  Burning feeling in your eyes.  Thick, yellowish discharge from an eye. This may turn into a crust on the eyelid overnight and cause your eyelids to stick together.  Swollen eyelids.  Blurred vision.  How is this diagnosed? Your health care provider can diagnose this condition based on your symptoms and medical history. Your health care provider may also take a sample of discharge from your eye to find the cause of your infection. This is rarely done. How is this treated? Treatment for this condition includes:  Antibiotic eye drops or ointment to clear the infection more quickly and prevent the spread of infection to others.  Oral antibiotic medicines to treat  infections that do not respond to drops or ointments, or last longer than 10 days.  Cool, wet cloths (cool compresses) placed on the eyes.  Artificial tears applied 2-6 times a day.  Follow these instructions at home: Medicines  Take or apply your antibiotic medicine as told by your health care provider. Do not stop taking or applying the antibiotic even if you start to feel better.  Take or apply over-the-counter and prescription medicines only as told by your health care provider.  Be very careful to avoid touching the edge of your eyelid with the eye drop bottle or the ointment tube when you apply medicines to the affected eye. This will keep you from spreading the infection to your other eye or to other people. Managing discomfort  Gently wipe away any drainage from your eye with a warm, wet washcloth or a cotton ball.  Apply a cool, clean washcloth to your eye for 10-20 minutes, 3-4 times a day. General instructions  Do not wear contact lenses until the inflammation is gone and your health care provider says it is safe to wear them again. Ask your health care provider how to sterilize or replace your contact lenses before you use them again. Wear glasses until you can resume wearing contacts.  Avoid wearing eye makeup until the inflammation is gone. Throw away any old eye cosmetics that may be contaminated.  Change or wash your pillowcase every day.  Do not share towels or washcloths. This may spread the infection.  Wash your hands often with soap and water.  Use paper towels to dry your hands.  Avoid touching or rubbing your eyes.  Do not drive or use heavy machinery if your vision is blurred. Contact a health care provider if:  You have a fever.  Your symptoms do not get better after 10 days. Get help right away if:  You have a fever and your symptoms suddenly get worse.  You have severe pain when you move your eye.  You have facial pain, redness, or  swelling.  You have sudden loss of vision. This information is not intended to replace advice given to you by your health care provider. Make sure you discuss any questions you have with your health care provider. Document Released: 09/09/2005 Document Revised: 01/18/2016 Document Reviewed: 06/22/2015 Elsevier Interactive Patient Education  2017 [employer].   Allergic Conjunctivitis A clear membrane (conjunctiva) covers the white part of your eye and the inner surface of your eyelid. Allergic conjunctivitis happens when this membrane has inflammation. This is caused by allergies. Common causes of allergic reactions (allergens)include:  Outdoor allergens, such as: ? Pollen. ? Grass and weeds. ? Mold spores.  Indoor allergens, such as: ? Dust. ? Smoke. ? Mold. ? Pet dander. ? Animal hair.  This condition can make your eye red or pink. It can also make your eye feel itchy. This condition cannot be spread from one person to another person (is not contagious). Follow these instructions at home:  Try not to be around things that you are allergic to.  Take or apply over-the-counter and prescription medicines only as told by your doctor. These include any eye drops.  Place a cool, clean washcloth on your eye for 10-20 minutes. Do this 3-4 times a day.  Do not touch or rub your eyes.  Do not wear contact lenses until the inflammation is gone. Wear glasses instead.  Do not wear eye makeup until the inflammation is gone.  Keep all follow-up visits as told by your doctor. This is important. Contact a doctor if:  Your symptoms get worse.  Your symptoms do not get better with treatment.  You have mild eye pain.  You are sensitive to light,  You have spots or blisters on your eyes.  You have pus coming from your eye.  You have a fever. Get help right away if:  You have redness, swelling, or other symptoms in only one eye.  Your vision is blurry.  You have vision  changes.  You have very bad eye pain. Summary  Allergic conjunctivitis is caused by allergies. It can make your eye red or pink, and it can make your eye feel itchy.  This condition cannot be spread from one person to another person (is not contagious).  Try not to be around things that you are allergic to.  Take or apply over-the-counter and prescription medicines only as told by your doctor. These include any eye drops.  Contact your doctor if your symptoms get worse or they do not get better with treatment. This information is not intended to replace advice given to you by your health care provider. Make sure you discuss any questions you have with your health care provider. Document Released: 02/27/2010 Document Revised: 05/03/2016 Document Reviewed: 05/03/2016 Elsevier Interactive Patient Education  2017 [employer] Allergic Rhinitis, Adult Allergic rhinitis is an allergic reaction that affects the mucous membrane inside the nose. It causes sneezing, a runny or stuffy nose, and the feeling of mucus going down the back of the throat (postnasal drip). Allergic  rhinitis can be mild to severe. There are two types of allergic rhinitis:  Seasonal. This type is also called hay fever. It happens only during certain seasons.  Perennial. This type can happen at any time of the year.  What are the causes? This condition happens when the body's defense system (immune system) responds to certain harmless substances called allergens as though they were germs.  Seasonal allergic rhinitis is triggered by pollen, which can come from grasses, trees, and weeds. Perennial allergic rhinitis may be caused by:  House dust mites.  Pet dander.  Mold spores.  What are the signs or symptoms? Symptoms of this condition include:  Sneezing.  Runny or stuffy nose (nasal congestion).  Postnasal drip.  Itchy nose.  Tearing of the eyes.  Trouble sleeping.  Daytime sleepiness.  How is this  diagnosed? This condition may be diagnosed based on:  Your medical history.  A physical exam.  Tests to check for related conditions, such as: ? Asthma. ? Pink eye. ? Ear infection. ? Upper respiratory infection.  Tests to find out which allergens trigger your symptoms. These may include skin or blood tests.  How is this treated? There is no cure for this condition, but treatment can help control symptoms. Treatment may include:  Taking medicines that block allergy symptoms, such as antihistamines. Medicine may be given as a shot, nasal spray, or pill.  Avoiding the allergen.  Desensitization. This treatment involves getting ongoing shots until your body becomes less sensitive to the allergen. This treatment may be done if other treatments do not help.  If taking medicine and avoiding the allergen does not work, new, stronger medicines may be prescribed.  Follow these instructions at home:  Find out what you are allergic to. Common allergens include smoke, dust, and pollen.  Avoid the things you are allergic to. These are some things you can do to help avoid allergens: ? Replace carpet with wood, tile, or vinyl flooring. Carpet can trap dander and dust. ? Do not smoke. Do not allow smoking in your home. ? Change your heating and air conditioning filter at least once a month. ? During allergy season:  Keep windows closed as much as possible.  Plan outdoor activities when pollen counts are lowest. This is usually during the evening hours.  When coming indoors, change clothing and shower before sitting on furniture or bedding.  Take over-the-counter and prescription medicines only as told by your health care provider.  Keep all follow-up visits as told by your health care provider. This is important. Contact a health care provider if:  You have a fever.  You develop a persistent cough.  You make whistling sounds when you breathe (you wheeze).  Your symptoms interfere  with your normal daily activities. Get help right away if:  You have shortness of breath. Summary  This condition can be managed by taking medicines as directed and avoiding allergens.  Contact your health care provider if you develop a persistent cough or fever.  During allergy season, keep windows closed as much as possible. This information is not intended to replace advice given to you by your health care provider. Make sure you discuss any questions you have with your health care provider. Document Released: 06/04/2001 Document Revised: 10/17/2016 Document Reviewed: 10/17/2016 Elsevier Interactive Patient Education  [employer].

## 2018-01-12 NOTE — Progress Notes (Signed)
Patient ID: [name], female    DOB: 05-29-1946, 72 y.o.   MRN: [id number]  PCP: Alycia Rossetti, MD  Chief Complaint  Patient presents with  . Eye Irritation    x2 days- R eye irritation and frainage- white crusts noted in AM- denies injury or allergen exposure    Subjective:   [name] is a 72 y.o. female, presents to clinic with CC of right swollen itchy eye, began yesterday while outside.  She felt like something possibly flew in her eye and it began to itch, shortly after trying to rub her eye or get wet eyebrow had fluid in it, she reports having white drainage that has been constant ever since.  Drainage is frequent, white, despite constantly wiping.  She has been doing warm and cold compresses and irrigating her eyes.  She denies any blurred vision or pain.  Denies photophobia.  Itching is moderate to severely irritating, her eye has become swollen around her eyelids, and her conjunctivae is red and irritated. She uses over-the-counter eyedrops for dry eye.  She has a history of cataracts and lens transplant.  She denies any history of contact lens use, no prescription eye lenses. She has had some associated sneezing and itchy nose. About a week ago she was sick with URI symptoms including sore throat and she has raspy voice today but most of her nasal congestion and sore throat have resolved.   She denies any other associated symptoms including fever, headache, sinus pain, sinus pressure pain, sweats, chills, nausea, vomiting.     Patient Active Problem List   Diagnosis Date Noted  . Obesity, Class I, BMI 30-34.9 12/18/2017  . Diabetic neuropathy ([place]) 10/13/2017  . Genetic testing 11/28/2016  . Esophageal dysphagia 09/30/2016  . Abdominal pain, epigastric 09/30/2016  . Encounter for screening colonoscopy 09/30/2016  . Ventral hernia 07/28/2015  . Situational anxiety 04/26/2015  . Right knee injury 01/23/2015  . Thyromegaly 01/23/2015  . Rash and  nonspecific skin eruption 01/23/2015  . Iron deficiency 06/17/2014  . Nephrolithiasis 04/13/2014  . Kidney stone 03/18/2014  . DDD (degenerative disc disease), lumbar 01/26/2014  . 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 Renal cyst 01/26/2014  . Back pain with left-sided sciatica 12/28/2013  . Uncontrolled type 2 diabetes mellitus with hyperglycemia, without long-term current use of insulin ([place]) 08/01/2013  . Chronic fatigue 12/26/2009  . GASTROPARESIS 12/15/2008  . Esophageal reflux 06/21/2008  . IBS 06/21/2008  . 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 ([name]) 02/09/2008  . Hyperlipidemia 02/09/2008  . Obesity 02/09/2008  . Essential hypertension 02/09/2008  . Insomnia 02/09/2008     Prior to Admission medications   Medication Sig Start Date End Date Taking? Authorizing Provider  acetaminophen (TYLENOL) 325 MG tablet Take 325 mg by mouth every 6 (six) hours as needed for mild pain.    Yes [provider]  Cholecalciferol (VITAMIN D3 PO) Take 1 tablet by mouth daily.   Yes [provider]  CINNAMON PO Take 1 capsule by mouth. Takes sometimes   Yes [provider]  glimepiride (AMARYL) 2 MG tablet TAKE 2 TABLETS BY MOUTH EVERY MORNING AND 1 TABLET AT BEDTIME. 10/13/17  Yes Rote, Modena Nunnery, MD  lansoprazole (PREVACID) 30 MG capsule 1 PO 30 MINS PRIOR TO FIRST MEAL 06/18/17  Yes Fields, Marga Melnick, MD  losartan (COZAAR) 25 MG tablet Take 1 tablet (25 mg total) by mouth every morning. 10/13/17  Yes Willard, Modena Nunnery, MD  metFORMIN (GLUCOPHAGE-XR) 500 MG 24 hr tablet Take 2 tablets (1,000 mg total) by mouth daily. 10/13/17  Yes [place], Modena Nunnery, MD  Multiple Vitamin (MULITIVITAMIN WITH MINERALS) TABS Take 1 tablet by mouth daily.   Yes [provider]  Nebivolol HCl (BYSTOLIC) 20 MG TABS Take 1 tablet (20 mg total) by mouth 2 (two) times daily. 10/13/17  Yes Tyro, Modena Nunnery, MD  tetrahydrozoline-zinc (VISINE-AC) 0.05-0.25 % ophthalmic solution  Place 2 drops into both eyes 3 (three) times daily as needed (dry eyes).   Yes [provider]  TRULICITY 2.83 TD/1.7OH SOPN INJECT 1 PEN INTO THE ABDOMINAL SKIN ONCE WEEKLY AS DIRECTED. 01/06/18  Yes Elayne Snare, MD     Allergies  Allergen Reactions  . Demerol [Meperidine] Other (See Comments)    Lost consciousness  . Bentyl [Dicyclomine Hcl] Swelling  . Invokana [Canagliflozin] Hives  . Metformin And Related Diarrhea  . Protonix [Pantoprazole Sodium] Swelling  . Statins Other (See Comments)    myalgia  . Ace Inhibitors Cough     Family History  Problem Relation Age of Onset  . Heart attack Mother   . Cancer Brother        Esophageal; smoker; deceased 50s  . Diabetes Daughter   . Leukemia Paternal Aunt        deceased 30s  . Pancreatic cancer Paternal Aunt        deceased 22  . Cancer Paternal Aunt        GI cancer; deceased 59s  . Cancer Cousin        kidney cancer; deceased 19; pat first cousin; daughter of aunt w/ leukemia  . Cancer Cousin        colon cancer @ 23; pat first cousin; son of aunt with GI cancer  . Anesthesia problems Neg Hx      Social History   Socioeconomic History  . Marital status: Married    Spouse name: Not on file  . Number of children: 3  . Years of education: Not on file  . Highest education level: Not on file  Occupational History  . Occupation: retired in [place]  . Financial resource strain: Not on file  . Food insecurity:    Worry: Not on file    Inability: Not on file  . Transportation needs:    Medical: Not on file    Non-medical: Not on file  Tobacco Use  . Smoking status: Never Smoker  . Smokeless tobacco: Never Used  Substance and Sexual Activity  . Alcohol use: No    Alcohol/week: 0.0 oz    Comment: X2 DRINKS PER YEAR  . Drug use: No  . Sexual activity: Not Currently    Birth control/protection: Surgical  Lifestyle  . Physical activity:    Days per week: Not on file    Minutes per session:  Not on file  . Stress: Not on file  Relationships  . Social connections:    Talks on phone: Not on file    Gets together: Not on file    Attends religious service: Not on file    Active member of club or organization: Not on file    Attends meetings of clubs or organizations: Not on file    Relationship status: Not on file  . Intimate partner violence:    Fear of current or ex partner: Not on file    Emotionally abused: Not on file    Physically abused: Not on file  Forced sexual activity: Not on file  Other Topics Concern  . Not on file  Social History Narrative  . Not on file     Review of Systems  Constitutional: Negative.  Negative for activity change, appetite change, chills, diaphoresis, fatigue and fever.  HENT: Negative.   Eyes: Positive for discharge, redness and itching. Negative for photophobia, pain and visual disturbance.  Respiratory: Negative.  Negative for cough, chest tightness and shortness of breath.   Cardiovascular: Negative.   Gastrointestinal: Negative.   Endocrine: Negative.   Genitourinary: Negative.   Musculoskeletal: Negative.   Skin: Negative.  Negative for color change, pallor and rash.  Neurological: Negative.  Negative for dizziness, syncope, facial asymmetry, speech difficulty, weakness, light-headedness, numbness and headaches.  Hematological: Negative.   Psychiatric/Behavioral: Negative.   All other systems reviewed and are negative.      Objective:    Vitals:   01/12/18 1053  BP: 122/64  Pulse: 86  Resp: 14  Temp: 98.1 F (36.7 C)  TempSrc: Oral  SpO2: 97%  Weight: 188 lb (85.3 kg)  Height: 5\' 6"  (1.676 m)      Physical Exam  Constitutional: She is oriented to person, place, and time. Vital signs are normal. She appears well-developed and well-nourished.  Non-toxic appearance. She does not have a sickly appearance. She does not appear ill. No distress.  HENT:  Head: Normocephalic and atraumatic.  Right Ear: Tympanic  membrane, external ear and ear canal normal.  Left Ear: Tympanic membrane, external ear and ear canal normal.  Nose: Mucosal edema and rhinorrhea present. Right sinus exhibits no maxillary sinus tenderness and no frontal sinus tenderness. Left sinus exhibits no maxillary sinus tenderness and no frontal sinus tenderness.  Mouth/Throat: Uvula is midline, oropharynx is clear and moist and mucous membranes are normal. Mucous membranes are not pale, not dry and not cyanotic. No oropharyngeal exudate, posterior oropharyngeal edema, posterior oropharyngeal erythema or tonsillar abscesses.  Mild swelling of right upper eyelid, no periorbital edema, no periorbital erythema No right periorbital tenderness to palpation Right eye watering with moderate clear discharge and some mild purulent discharge, diffuse right conjunctival erythema Fluorescein eye exam performed, no uptake, no foreign bodies   Eyes: Pupils are equal, round, and reactive to light. EOM and lids are normal. Lids are everted and swept, no foreign bodies found. Right eye exhibits discharge and exudate. Right eye exhibits no hordeolum. No foreign body present in the right eye. Left eye exhibits no chemosis, no discharge, no exudate and no hordeolum. No foreign body present in the left eye. Right conjunctiva is injected. Right conjunctiva has no hemorrhage. Left conjunctiva is not injected. Left conjunctiva has no hemorrhage. Right eye exhibits normal extraocular motion and no nystagmus. Left eye exhibits normal extraocular motion and no nystagmus. Right pupil is round and reactive. Left pupil is round and reactive. Pupils are equal.  Slit lamp exam:      The right eye shows no foreign body and no fluorescein uptake.  Neck: Normal range of motion and phonation normal. Neck supple. No tracheal deviation present. No thyromegaly present.  Cardiovascular: Normal rate, regular rhythm, normal heart sounds and normal pulses. Exam reveals no gallop and no  friction rub.  No murmur heard. Pulses:      Radial pulses are 2+ on the right side, and 2+ on the left side.  Pulmonary/Chest: Effort normal and breath sounds normal. No stridor. No respiratory distress. She has no decreased breath sounds. She has no wheezes. She has no  rhonchi. She has no rales. She exhibits no tenderness.  Abdominal: Soft. Normal appearance and bowel sounds are normal. She exhibits no distension. There is no tenderness. There is no rebound and no guarding.  Musculoskeletal: Normal range of motion. She exhibits no edema or deformity.  Lymphadenopathy:       Head (right side): No submental, no submandibular, no tonsillar and no preauricular adenopathy present.       Head (left side): No submental, no submandibular, no tonsillar, no preauricular and no posterior auricular adenopathy present.    She has no cervical adenopathy.  Neurological: She is alert and oriented to person, place, and time. She exhibits normal muscle tone. Gait normal.  Skin: Skin is warm, dry and intact. Capillary refill takes less than 2 seconds. No rash noted. She is not diaphoretic. No pallor.  Psychiatric: She has a normal mood and affect. Her speech is normal and behavior is normal.     Visual Acuity done - see chart     Assessment & Plan:      ICD-10-CM   1. Acute conjunctivitis of right eye, unspecified acute conjunctivitis type H10.31 trimethoprim-polymyxin b (POLYTRIM) ophthalmic solution  2. Allergic rhinitis, unspecified seasonality, unspecified trigger J30.9 fluticasone (FLONASE) 50 MCG/ACT nasal spray    montelukast (SINGULAIR) 10 MG tablet    Right conjunctivitis, bacterial versus allergic. Patient has baseline allergies.  Her eye symptoms started yesterday while being outside, with sneezing and itching.  She reported possible foreign body, followed by severe itching.  No evidence of an abrasion here today.  But she does have early discharge and some upper eyelid edema.  Will cover for  bacterial eye infection.  We will also treat nasal allergies.  Visual acuity showed mild decreased vision on the right however not significant enough to be of concern her to warrant ophthalmology referral.  eye exam was reassuring.  She has normal EOMs, no periorbital pain or erythema.    Discussed concerning signs and symptoms with the patient for which she should come to our office or go to the ER, he verbalizes understanding.  She agrees to plan of care at this time and will follow-up as needed  Delsa Grana, PA-C 01/12/18 11:10 AM

## 2018-01-14 ENCOUNTER — Ambulatory Visit: Payer: Medicare Other | Admitting: Family Medicine

## 2018-01-15 ENCOUNTER — Ambulatory Visit: Payer: Medicare Other | Admitting: Family Medicine

## 2018-01-16 ENCOUNTER — Encounter: Payer: Self-pay | Admitting: Family Medicine

## 2018-01-16 ENCOUNTER — Other Ambulatory Visit: Payer: Self-pay

## 2018-01-16 ENCOUNTER — Other Ambulatory Visit: Payer: Self-pay | Admitting: *Deleted

## 2018-01-16 ENCOUNTER — Ambulatory Visit (INDEPENDENT_AMBULATORY_CARE_PROVIDER_SITE_OTHER): Payer: Medicare Other | Admitting: Family Medicine

## 2018-01-16 VITALS — BP 122/62 | HR 80 | Temp 98.5°F | Resp 14 | Ht 66.0 in | Wt 188.0 lb

## 2018-01-16 DIAGNOSIS — I10 Essential (primary) hypertension: Secondary | ICD-10-CM

## 2018-01-16 DIAGNOSIS — Z23 Encounter for immunization: Secondary | ICD-10-CM

## 2018-01-16 DIAGNOSIS — E7800 Pure hypercholesterolemia, unspecified: Secondary | ICD-10-CM

## 2018-01-16 DIAGNOSIS — J312 Chronic pharyngitis: Secondary | ICD-10-CM | POA: Diagnosis not present

## 2018-01-16 DIAGNOSIS — E1143 Type 2 diabetes mellitus with diabetic autonomic (poly)neuropathy: Secondary | ICD-10-CM

## 2018-01-16 DIAGNOSIS — E1165 Type 2 diabetes mellitus with hyperglycemia: Secondary | ICD-10-CM | POA: Diagnosis not present

## 2018-01-16 DIAGNOSIS — E041 Nontoxic single thyroid nodule: Secondary | ICD-10-CM | POA: Diagnosis not present

## 2018-01-16 LAB — LIPID PANEL
Cholesterol: 154 mg/dL (ref <200)
HDL: 47 mg/dL — AB (ref >50)
LDL CHOLESTEROL (CALC): 88 mg/dL
NON-HDL CHOLESTEROL (CALC): 107 mg/dL (ref <130)
TRIGLYCERIDES: 98 mg/dL (ref <150)
Total CHOL/HDL Ratio: 3.3 (calc) (ref <5.0)

## 2018-01-16 MED ORDER — BLOOD GLUCOSE SYSTEM PAK KIT: PACK | 1 refills | Status: DC

## 2018-01-16 NOTE — Addendum Note (Signed)
Addended by: Sheral Flow on: 01/16/2018 09:55 AM   Modules accepted: Orders

## 2018-01-16 NOTE — Addendum Note (Signed)
Addended by: Ofilia Neas R on: 01/16/2018 10:07 AM   Modules accepted: Orders

## 2018-01-16 NOTE — Assessment & Plan Note (Signed)
Controlled no changes 

## 2018-01-16 NOTE — Progress Notes (Signed)
   Subjective:    Patient ID: [name], female    DOB: Jan 20, 1946, 72 y.o.   MRN: [id number]  Patient presents for Follow-up (is fasting)  Pt here to f/u chronic medical problems    Chronic sore throat , has seen ENT, had [place] a few years ago that showed small thyroid nodules , also followed for her hearing aides , denies any drainage, reflux symptoms    DM- followed by Dr. Dwyane Dee , she was taken off Jardiance, now Trulicty , S4H down to 6.7%, previous 9% in Jan   Needs a new meter   Due for repeat lipids, had elevated TG in Jan  Due for Pneumonia vaccine 23    Review Of Systems:  GEN- denies fatigue, fever, weight loss,weakness, recent illness HEENT- denies eye drainage, change in vision, nasal discharge, CVS- denies chest pain, palpitations RESP- denies SOB, cough, wheeze ABD- denies N/V, change in stools, abd pain GU- denies dysuria, hematuria, dribbling, incontinence MSK- denies joint pain, muscle aches, injury Neuro- denies headache, dizziness, syncope, seizure activity       Objective:    BP 122/62   Pulse 80   Temp 98.5 F (36.9 C) (Oral)   Resp 14   Ht 5\' 6"  (1.676 m)   Wt 188 lb (85.3 kg)   SpO2 96%   BMI 30.34 kg/m  GEN- NAD, alert and oriented x3 HEENT- PERRL, EOMI, non injected sclera, pink conjunctiva, MMM, oropharynx clear,nares clear  Neck- Supple, no thyromegaly CVS- RRR, no murmur RESP-CTAB ABD-NABS,soft,NT,ND EXT- No edema Pulses- Radial, DP- 2+        Assessment & Plan:      Problem List Items Addressed This Visit      Unprioritized   Diabetic neuropathy ([place])   Uncontrolled type 2 diabetes mellitus with hyperglycemia, without long-term current use of insulin ([place])    Reviewed note and labs with patient at bedside No changes New meter to be sent to pharmacy       Thyroid nodule    Repeat US, obtain ENT last note for chronic sore throat, has had scope done      Hyperlipidemia - Primary    Does not tolerate statins Dietary  changes Recheck lipids Diabetes has improved so hopefully TG improved      Relevant Orders   Lipid panel   Essential hypertension    Controlled no changes      Chronic sore throat      Note: This dictation was prepared with Dragon dictation along with smaller phrase technology. Any transcriptional errors that result from this process are unintentional.

## 2018-01-16 NOTE — Assessment & Plan Note (Signed)
Reviewed note and labs with patient at bedside No changes New meter to be sent to pharmacy

## 2018-01-16 NOTE — Assessment & Plan Note (Signed)
Does not tolerate statins Dietary changes Recheck lipids Diabetes has improved so hopefully TG improved

## 2018-01-16 NOTE — Patient Instructions (Addendum)
We will call with lab results Pneumonia shot given New diabetic meter Thyroid Ultrasound   F/U November Physical

## 2018-01-16 NOTE — Assessment & Plan Note (Signed)
Repeat US, obtain ENT last note for chronic sore throat, has had scope done

## 2018-01-21 ENCOUNTER — Ambulatory Visit (HOSPITAL_COMMUNITY)
Admission: RE | Admit: 2018-01-21 | Discharge: 2018-01-21 | Disposition: A | Discharge status: Home / Self Care | Payer: Medicare Other | Source: Ambulatory Visit | Attending: Family Medicine | Admitting: Family Medicine

## 2018-01-21 DIAGNOSIS — E041 Nontoxic single thyroid nodule: Secondary | ICD-10-CM | POA: Diagnosis present

## 2018-01-21 DIAGNOSIS — E042 Nontoxic multinodular goiter: Secondary | ICD-10-CM | POA: Diagnosis not present

## 2018-01-22 ENCOUNTER — Encounter: Payer: Self-pay | Admitting: Endocrinology

## 2018-01-22 ENCOUNTER — Encounter: Payer: Self-pay | Admitting: *Deleted

## 2018-01-22 ENCOUNTER — Ambulatory Visit: Payer: Medicare Other | Admitting: Endocrinology

## 2018-01-22 VITALS — BP 130/80 | HR 82 | Ht 66.0 in | Wt 187.0 lb

## 2018-01-22 DIAGNOSIS — E1165 Type 2 diabetes mellitus with hyperglycemia: Secondary | ICD-10-CM | POA: Diagnosis not present

## 2018-01-22 LAB — GLUCOSE, POCT (MANUAL RESULT ENTRY): POC Glucose: 134 mg/dl — AB (ref 70–99)

## 2018-01-22 MED ORDER — METFORMIN HCL ER 500 MG PO TB24
1000.0000 mg | ORAL_TABLET | Freq: Every day | ORAL | 2 refills | Status: DC
Start: 2018-01-22 — End: 2018-04-02

## 2018-01-22 NOTE — Patient Instructions (Addendum)
Take Glimeperide 1 am and 1 at dinner  Check blood sugars on waking up 2-3/7   Also check blood sugars about 2 hours after a meal and do this after different meals by rotation  Recommended blood sugar levels on waking up is 90-130 and about 2 hours after meal is 130-180  Please bring your blood sugar monitor to each visit, thank you

## 2018-01-22 NOTE — Progress Notes (Signed)
Patient ID: [name], female   DOB: [birth year], 72 y.o.   MRN: [id number]          Reason for Appointment: Follow-up for Type 2 Diabetes  Referring physician: [facility]   History of Present Illness:          Date of diagnosis of type 2 diabetes mellitus:        Background history: She has been on metformin since diagnosis Also has been on Amaryl or glipizide in the past Has had only infrequent A1c was done according to the current records that are available, A1c has been persistently high except in 2014 when it was 7.2 She thinks she had a rash for Januvia and this was stopped in 2016  Recent history:   A1c in March was 7.9  Non-insulin hypoglycemic drugs the patient is taking are: metformin ER 1g, Amaryl 4 mg in the morning and 2 mg at bedtime, Trulicity 0.34 mg daily  Current management, blood sugar patterns and problems identified:  She had difficulty getting her glucose meter through her insurance and has not started checking her sugar as yet  She was started on Trulicity in March because of poor control and blood sugars periodically over 200 including in the lab; also Jardiance.  Because of side effects  She has been able to start the Trulicity and her daughter is doing her injection every week  She is also continuing to take her Amaryl and metformin  She was asked to cut back on regular soft drinks which she has difficulty stopping  Overall she think that she is having decreased appetite but she is also having issues with allergies  However has not lost any weight  Lab sugar today was 134 nonfasting and this is despite her having a drink of boost and some juice about an hour before coming in        Side effects from medications have been: Diarrhea from metformin, rash from Januvia, candidiasis from Jardiance  Compliance with the medical regimen: Poor Hypoglycemia: None   Glucose monitoring:  not done   times a day         Glucometer:  Unknown     Blood  Glucose readings not available  Self-care: The diet that the patient has been following is: None, she periodically will eat fried food, has snacks like popcorn  Does not drink sugar free drinks    Typical meal intake: Breakfast is  sometimes boost or iced coffee drink              Dietician visit, most recent: 2017               Exercise: None    Weight history:Maximum weight 214  Wt Readings from Last 3 Encounters:  01/22/18 187 lb (84.8 kg)  01/16/18 188 lb (85.3 kg)  01/12/18 188 lb (85.3 kg)    Glycemic control:   Lab Results  Component Value Date   HGBA1C 7.9 (H) 12/18/2017   HGBA1C 9.0 (H) 09/24/2017   HGBA1C 7.9 (H) 07/03/2016   Lab Results  Component Value Date   MICROALBUR 11.3 01/29/2016   LDLCALC 88 01/16/2018   CREATININE 1.11 12/18/2017   Lab Results  Component Value Date   MICRALBCREAT 46 (H) 01/29/2016    No results found for: FRUCTOSAMINE    Allergies as of 01/22/2018      Reactions   Demerol [meperidine] Other (See Comments)   Lost consciousness   Bentyl [dicyclomine Hcl] Swelling  Invokana [canagliflozin] Hives   Metformin And Related Diarrhea   Protonix [pantoprazole Sodium] Swelling   Statins Other (See Comments)   myalgia   Ace Inhibitors Cough      Medication List        Accurate as of 01/22/18  1:44 PM. Always use your most recent med list.          acetaminophen 325 MG tablet Commonly known as:  TYLENOL Take 325 mg by mouth every 6 (six) hours as needed for mild pain.   Blood Glucose System Pak Kit Use as directed to monitor FSBS 1x daily. Dx: E11.9   CINNAMON PO Take 1 capsule by mouth. Takes sometimes   fluticasone 50 MCG/ACT nasal spray Commonly known as:  FLONASE Place 2 sprays into both nostrils daily.   glimepiride 2 MG tablet Commonly known as:  AMARYL TAKE 2 TABLETS BY MOUTH EVERY MORNING AND 1 TABLET AT BEDTIME.   lansoprazole 30 MG capsule Commonly known as:  PREVACID 1 PO 30 MINS PRIOR TO FIRST MEAL     losartan 25 MG tablet Commonly known as:  COZAAR Take 1 tablet (25 mg total) by mouth every morning.   metFORMIN 500 MG 24 hr tablet Commonly known as:  GLUCOPHAGE-XR Take 2 tablets (1,000 mg total) by mouth daily.   montelukast 10 MG tablet Commonly known as:  SINGULAIR Take 1 tablet (10 mg total) by mouth at bedtime.   multivitamin with minerals Tabs tablet Take 1 tablet by mouth daily.   Nebivolol HCl 20 MG Tabs Commonly known as:  BYSTOLIC Take 1 tablet (20 mg total) by mouth 2 (two) times daily.   tetrahydrozoline-zinc 0.05-0.25 % ophthalmic solution Commonly known as:  VISINE-AC Place 2 drops into both eyes 3 (three) times daily as needed (dry eyes).   TRULICITY 5.68 LE/7.5TZ Sopn Generic drug:  Dulaglutide INJECT 1 PEN INTO THE ABDOMINAL SKIN ONCE WEEKLY AS DIRECTED.   VITAMIN D3 PO Take 1 tablet by mouth daily.       Allergies:  Allergies  Allergen Reactions  . Demerol [Meperidine] Other (See Comments)    Lost consciousness  . Bentyl [Dicyclomine Hcl] Swelling  . Invokana [Canagliflozin] Hives  . Metformin And Related Diarrhea  . Protonix [Pantoprazole Sodium] Swelling  . Statins Other (See Comments)    myalgia  . Ace Inhibitors Cough    Past Medical History:  Diagnosis Date  . Abdominal hernia 02/26/12   unrepaired  . 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 Anemia   . Anxiety   . Blood transfusion 1980's  . Complication of anesthesia    has a hard time waking up; can't lay flat  . Degenerative disc disease, lumbar    pressing on L3 and L4  . Diabetes mellitus ([place]) 1989   Type 2 NIDDM; "cancer treatment gave me diabetes"  . DJD (degenerative joint disease) of lumbar spine   . Dysrhythmia    "irregular"  . Exertional dyspnea   . GERD (gastroesophageal reflux disease)    mann  . Heart murmur    "think I outgrew it"  . History of kidney stones   . History of stomach ulcers   . Hx: UTI (urinary tract  infection)   . Hyperlipidemia   . Hypersensitivity    in tongue  . Hypertension   . IBS (irritable bowel syndrome)   . Insomnia   . Kidney cysts    RT KIDNEY  .  Kidney stones 2009   s/p lithotripsy  . Obesity   . PONV (postoperative nausea and vomiting)   . Shortness of breath    unable to lie flat    Past Surgical History:  Procedure Laterality Date  . ABDOMINAL HYSTERECTOMY  2002  . APPENDECTOMY    . BACK SURGERY    . BREAST BIOPSY     right  . CATARACT EXTRACTION W/ INTRAOCULAR LENS  IMPLANT, BILATERAL  2009-2010  . CESAREAN SECTION  1973; 1978; 1981  . COLONOSCOPY WITH PROPOFOL N/A 10/29/2016   Procedure: COLONOSCOPY WITH PROPOFOL;  Surgeon: Danie Binder, MD;  [facility];  Service: Endoscopy;  Laterality: N/A;  10:00 am  . CYSTOSCOPY Left 04/13/2014   Procedure: CYSTOSCOPY FLEXIBLE;  Surgeon: Ardis Hughs, MD;  Location: [facility];  Service: Urology;  Laterality: Left;  with STENT  . ESOPHAGOGASTRODUODENOSCOPY (EGD) WITH PROPOFOL N/A 10/29/2016   Procedure: ESOPHAGOGASTRODUODENOSCOPY (EGD) WITH PROPOFOL;  Surgeon: Danie Binder, MD;  [facility];  Service: Endoscopy;  Laterality: N/A;  . EYE SURGERY    . implant and screws put in the right lower jaw  11/2008   dental surgery  . kidney blockage  ?1990's   " major surgery ;put kidney on pump for awhile"  . LITHOTRIPSY     "several times"  . LUMBAR FUSION  08/2010  . MASTECTOMY  1997   right  . NEPHROLITHOTOMY Left 04/13/2014   Procedure: LEFT PERCUTANEOUS NEPHROLITHOTOMY WITH SURGEON ACCESS;  Surgeon: Ardis Hughs, MD;  Location: [facility];  Service: Urology;  Laterality: Left;  . OVARIAN CYST SURGERY    . PARATHYROIDECTOMY  02/26/2012   Procedure: PARATHYROIDECTOMY;  Surgeon: Ascencion Dike, MD;  Location: [facility];  Service: ENT;;  . Azzie Almas DILATION N/A 10/29/2016   Procedure: SAVORY DILATION;  Surgeon: Danie Binder, MD;  [facility];  Service: Endoscopy;  Laterality: N/A;  . [place]  SURGERY  2011  . urological surgery for blocked ureter secondary to kidney stone      Family History  Problem Relation Age of Onset  . Heart attack Mother   . Cancer Brother        Esophageal; smoker; deceased 2s  . Diabetes Daughter   . Leukemia Paternal Aunt        deceased 76s  . Pancreatic cancer Paternal Aunt        deceased 88  . Cancer Paternal Aunt        GI cancer; deceased 71s  . Cancer Cousin        kidney cancer; deceased 48; pat first cousin; daughter of aunt w/ leukemia  . Cancer Cousin        colon cancer @ 58; pat first cousin; son of aunt with GI cancer  . Anesthesia problems Neg Hx     Social History:  reports that she has never smoked. She has never used smokeless tobacco. She reports that she does not drink alcohol or use drugs.   Review of Systems   Lipid history: Not on treatment, is statin intolerant Recent LDL below 100    Lab Results  Component Value Date   CHOL 154 01/16/2018   HDL 47 (L) 01/16/2018   LDLCALC 88 01/16/2018   TRIG 98 01/16/2018   CHOLHDL 3.3 01/16/2018           Hypertension: Blood pressure treated with losartan and Bystolic  BP Readings from Last 3 Encounters:  01/22/18 130/80  01/16/18  122/62  01/12/18 122/64    Most recent eye exam was in 3/18  Most recent foot exam: 3/19    LABS:  Office Visit on 01/16/2018  Component Date Value Ref Range Status  . Cholesterol 01/16/2018 154  <200 mg/dL Final  . HDL 01/16/2018 47* >50 mg/dL Final  . Triglycerides 01/16/2018 98  <150 mg/dL Final  . LDL Cholesterol (Calc) 01/16/2018 88  mg/dL (calc) Final   Comment: Reference range: <100 . Desirable range <100 mg/dL for primary prevention;   <70 mg/dL for patients with CHD or diabetic patients  with > or = 2 CHD risk factors. Marland Kitchen LDL-C is now calculated using the Martin-Hopkins  calculation, which is a validated novel method providing  better accuracy than the Friedewald equation in the  estimation of LDL-C.  Cresenciano Genre et al. Annamaria Helling. 0321;224(82): 2061-2068  (http://education.[facility].com/faq/FAQ164)   . Total CHOL/HDL Ratio 01/16/2018 3.3  <5.0 (calc) Final  . Non-HDL Cholesterol (Calc) 01/16/2018 107  <130 mg/dL (calc) Final   Comment: For patients with diabetes plus 1 major ASCVD risk  factor, treating to a non-HDL-C goal of <100 mg/dL  (LDL-C of <70 mg/dL) is considered a therapeutic  option.     Physical Examination:  BP 130/80 (BP Location: Left Arm, Patient Position: Sitting, Cuff Size: Normal)   Pulse 82   Ht '5\' 6"'  (1.676 m)   Wt 187 lb (84.8 kg)   SpO2 98%   BMI 30.18 kg/m       ASSESSMENT:  Diabetes type 2, recent BMI 30  See history of present illness for detailed discussion of current diabetes management, blood sugar patterns and problems identified  Currently on a regimen of metformin, Amaryl and Trulicity She is having some decreased appetite with either Trulicity or other problem that she is having but not having any excessive nausea with this, may have mild nausea only for the first day or 2 after the injection  Her blood sugars are difficult to assess as she did not get any labs and has not started home monitoring Her glucose in the office was near normal  Think she can tolerate Trulicity and is also dealing with other problems such as allergies that may be affecting her appetite  Hypertension: Appears well-controlled now  PLAN:    She will start checking her blood sugar Again besides the need for starting some walking for exercise She can check some readings after meals and occasionally in the morning also For now she will continue the same dose of Trulicity 5.00 mg weekly so higher dose may cause further anorexia  Discussed blood sugar targets at various times   There are no Patient Instructions on file for this visit.    Elayne Snare 01/22/2018, 1:44 PM   Note: This office note was prepared with Dragon voice recognition system technology. Any  transcriptional errors that result from this process are unintentional.

## 2018-03-08 NOTE — Progress Notes (Signed)
Patient ID: [name], female   DOB: 01/17/1946, 72 y.o.   MRN: [id number]          Reason for Appointment: Follow-up for Type 2 Diabetes  Referring physician: [facility]   History of Present Illness:          Date of diagnosis of type 2 diabetes mellitus:        Background history: She has been on metformin since diagnosis Also has been on Amaryl or glipizide in the past Has had only infrequent A1c was done according to the current records that are available, A1c has been persistently high except in 2014 when it was 7.2 She thinks she had a rash for Januvia and this was stopped in 2016  Recent history:   A1c in March was 7.9 and is now 6.3  Non-insulin hypoglycemic drugs the patient is taking are: metformin ER 1g, Amaryl 2 mg in the morning and 2 mg at bedtime, Trulicity 4.33 mg daily  Current management, blood sugar patterns and problems identified:  She has been on Trulicity now for 3 months  This is with the help of her daughter and she has some needle phobia  She does not complain of any decreased appetite or nausea currently, also she thinks that it has helped her and she is not concerned about the cost currently  She thinks her meter is not keeping the memory of her blood sugars but she has only one reading of 174 midmorning last month  Lab glucose 116 today in the office  She has done only a little bit of exercise currently in the mornings and planning to do more  Has not lost any weight as yet  She is really feeling a little weaker around midday and may feel better when eating some fruit at that time        Side effects from medications have been: Diarrhea from metformin, rash from Januvia, candidiasis from Jardiance  Compliance with the medical regimen improving Hypoglycemia: None  documented  Glucose monitoring:  done <1 times a day         Glucometer:  Accu-Chek Aviva     Blood Glucose readings not done except once  Self-care: The diet that the patient  has been following is: None, she periodically will eat fried food, has snacks like popcorn  Does not drink sugar free drinks    Typical meal intake: Breakfast is  sometimes boost She is trying to reduce regular soft drinks              Dietician visit, most recent: 2017               Exercise: walking    Weight history:Maximum weight 214  Wt Readings from Last 3 Encounters:  03/09/18 187 lb (84.8 kg)  01/22/18 187 lb (84.8 kg)  01/16/18 188 lb (85.3 kg)    Glycemic control:   Lab Results  Component Value Date   HGBA1C 6.3 (A) 03/09/2018   HGBA1C 7.9 (H) 12/18/2017   HGBA1C 9.0 (H) 09/24/2017   Lab Results  Component Value Date   MICROALBUR 11.3 01/29/2016   LDLCALC 88 01/16/2018   CREATININE 1.11 12/18/2017   Lab Results  Component Value Date   MICRALBCREAT 46 (H) 01/29/2016    No results found for: FRUCTOSAMINE    Allergies as of 03/09/2018      Reactions   Demerol [meperidine] Other (See Comments)   Lost consciousness   Bentyl [dicyclomine Hcl] Swelling  Invokana [canagliflozin] Hives   Metformin And Related Diarrhea   Protonix [pantoprazole Sodium] Swelling   Statins Other (See Comments)   myalgia   Ace Inhibitors Cough      Medication List        Accurate as of 03/09/18  9:30 AM. Always use your most recent med list.          acetaminophen 325 MG tablet Commonly known as:  TYLENOL Take 325 mg by mouth every 6 (six) hours as needed for mild pain.   Blood Glucose System Pak Kit Use as directed to monitor FSBS 1x daily. Dx: E11.9   CINNAMON PO Take 1 capsule by mouth. Takes sometimes   fluticasone 50 MCG/ACT nasal spray Commonly known as:  FLONASE Place 2 sprays into both nostrils daily.   glimepiride 2 MG tablet Commonly known as:  AMARYL TAKE 2 TABLETS BY MOUTH EVERY MORNING AND 1 TABLET AT BEDTIME.   glucose blood test strip Commonly known as:  ACCU-CHEK GUIDE 1 each by Other route as needed for other. Use as instructed to check  blood sugar daily.   LANCETS SUPER THIN 28G Misc Use to check blood sugar once daily.   lansoprazole 30 MG capsule Commonly known as:  PREVACID 1 PO 30 MINS PRIOR TO FIRST MEAL   losartan 25 MG tablet Commonly known as:  COZAAR Take 1 tablet (25 mg total) by mouth every morning.   metFORMIN 500 MG 24 hr tablet Commonly known as:  GLUCOPHAGE-XR Take 2 tablets (1,000 mg total) by mouth daily.   montelukast 10 MG tablet Commonly known as:  SINGULAIR Take 1 tablet (10 mg total) by mouth at bedtime.   multivitamin with minerals Tabs tablet Take 1 tablet by mouth daily.   Nebivolol HCl 20 MG Tabs Commonly known as:  BYSTOLIC Take 1 tablet (20 mg total) by mouth 2 (two) times daily.   tetrahydrozoline-zinc 0.05-0.25 % ophthalmic solution Commonly known as:  VISINE-AC Place 2 drops into both eyes 3 (three) times daily as needed (dry eyes).   TRULICITY 3.64 WO/0.3OZ Sopn Generic drug:  Dulaglutide INJECT 1 PEN INTO THE ABDOMINAL SKIN ONCE WEEKLY AS DIRECTED.   VITAMIN D3 PO Take 1 tablet by mouth daily.       Allergies:  Allergies  Allergen Reactions  . Demerol [Meperidine] Other (See Comments)    Lost consciousness  . Bentyl [Dicyclomine Hcl] Swelling  . Invokana [Canagliflozin] Hives  . Metformin And Related Diarrhea  . Protonix [Pantoprazole Sodium] Swelling  . Statins Other (See Comments)    myalgia  . Ace Inhibitors Cough    Past Medical History:  Diagnosis Date  . Abdominal hernia 02/26/12   unrepaired  . 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 Anemia   . Anxiety   . Blood transfusion 1980's  . Complication of anesthesia    has a hard time waking up; can't lay flat  . Degenerative disc disease, lumbar    pressing on L3 and L4  . Diabetes mellitus ([place]) 1989   Type 2 NIDDM; "cancer treatment gave me diabetes"  . DJD (degenerative joint disease) of lumbar spine   . Dysrhythmia    "irregular"  . Exertional dyspnea    . GERD (gastroesophageal reflux disease)    mann  . Heart murmur    "think I outgrew it"  . History of kidney stones   . History of stomach ulcers   . Hx: UTI (  urinary tract infection)   . Hyperlipidemia   . Hypersensitivity    in tongue  . Hypertension   . IBS (irritable bowel syndrome)   . Insomnia   . Kidney cysts    RT KIDNEY  . Kidney stones 2009   s/p lithotripsy  . Obesity   . PONV (postoperative nausea and vomiting)   . Shortness of breath    unable to lie flat    Past Surgical History:  Procedure Laterality Date  . ABDOMINAL HYSTERECTOMY  2002  . APPENDECTOMY    . BACK SURGERY    . BREAST BIOPSY     right  . CATARACT EXTRACTION W/ INTRAOCULAR LENS  IMPLANT, BILATERAL  2009-2010  . CESAREAN SECTION  1973; 1978; 1981  . COLONOSCOPY WITH PROPOFOL N/A 10/29/2016   Procedure: COLONOSCOPY WITH PROPOFOL;  Surgeon: Danie Binder, MD;  [facility];  Service: Endoscopy;  Laterality: N/A;  10:00 am  . CYSTOSCOPY Left 04/13/2014   Procedure: CYSTOSCOPY FLEXIBLE;  Surgeon: Ardis Hughs, MD;  Location: [facility];  Service: Urology;  Laterality: Left;  with STENT  . ESOPHAGOGASTRODUODENOSCOPY (EGD) WITH PROPOFOL N/A 10/29/2016   Procedure: ESOPHAGOGASTRODUODENOSCOPY (EGD) WITH PROPOFOL;  Surgeon: Danie Binder, MD;  [facility];  Service: Endoscopy;  Laterality: N/A;  . EYE SURGERY    . implant and screws put in the right lower jaw  11/2008   dental surgery  . kidney blockage  ?1990's   " major surgery ;put kidney on pump for awhile"  . LITHOTRIPSY     "several times"  . LUMBAR FUSION  08/2010  . MASTECTOMY  1997   right  . NEPHROLITHOTOMY Left 04/13/2014   Procedure: LEFT PERCUTANEOUS NEPHROLITHOTOMY WITH SURGEON ACCESS;  Surgeon: Ardis Hughs, MD;  [facility];  Service: Urology;  Laterality: Left;  . OVARIAN CYST SURGERY    . PARATHYROIDECTOMY  02/26/2012   Procedure: PARATHYROIDECTOMY;  Surgeon: Ascencion Dike, MD;  Location: [facility];   Service: ENT;;  . Azzie Almas DILATION N/A 10/29/2016   Procedure: SAVORY DILATION;  Surgeon: Danie Binder, MD;  [facility];  Service: Endoscopy;  Laterality: N/A;  . [place] SURGERY  2011  . urological surgery for blocked ureter secondary to kidney stone      Family History  Problem Relation Age of Onset  . Heart attack Mother   . Cancer Brother        Esophageal; smoker; deceased 61s  . Diabetes Daughter   . Leukemia Paternal Aunt        deceased 52s  . Pancreatic cancer Paternal Aunt        deceased 15  . Cancer Paternal Aunt        GI cancer; deceased 20s  . Cancer Cousin        kidney cancer; deceased 43; pat first cousin; daughter of aunt w/ leukemia  . Cancer Cousin        colon cancer @ 17; pat first cousin; son of aunt with GI cancer  . Anesthesia problems Neg Hx     Social History:  reports that she has never smoked. She has never used smokeless tobacco. She reports that she does not drink alcohol or use drugs.   Review of Systems   Lipid history: Not on treatment, is statin intolerant Recent LDL below 100    Lab Results  Component Value Date   CHOL 154 01/16/2018   HDL 47 (L) 01/16/2018   [place]  88 01/16/2018   TRIG 98 01/16/2018   CHOLHDL 3.3 01/16/2018           Hypertension: Blood pressure treated with losartan and Bystolic by her PCP  BP Readings from Last 3 Encounters:  03/09/18 136/78  01/22/18 130/80  01/16/18 122/62    Most recent eye exam was in 3/18  Most recent foot exam: 3/19    LABS:  Office Visit on 03/09/2018  Component Date Value Ref Range Status  . Hemoglobin A1C 03/09/2018 6.3* 4.0 - 5.6 % Final  . POC Glucose 03/09/2018 116* 70 - 99 mg/dl Final    Physical Examination:  BP 136/78 (BP Location: Left Arm, Patient Position: Sitting, Cuff Size: Normal)   Pulse 92   Ht '5\' 6"'  (1.676 m)   Wt 187 lb (84.8 kg)   SpO2 98%   BMI 30.18 kg/m       ASSESSMENT:  Diabetes type 2, recent BMI 30  See history of  present illness for detailed discussion of current diabetes management, blood sugar patterns and problems identified Her A1c is excellent at 6.3  This is a with adding Trulicity to her regimen of Amaryl and metformin Also taking relatively less Amaryl currently Her main difficulty is that not being able to lose weight She is interested in seeing a dietitian but also wants to start using her exercise implement at home   PLAN:    She will start checking her blood sugar with Accu-Chek guide meter that was given to her today She will need to check few readings after meals also To cut down Amaryl to half tablet in the morning No change in Trulicity Recheck W2B in 3 months Microalbumin today Discussed need for regular aerobic exercise   Patient Instructions  Check blood sugars on waking up  3/7 Also check blood sugars about 2 hours after a meal and do this after different meals by rotation  Recommended blood sugar levels on waking up is 90-130 and about 2 hours after meal is 130-160  Please bring your blood sugar monitor to each visit, thank you  Glimeperide 1/2 in am  More exercise     Elayne Snare 03/09/2018, 9:30 AM   Note: This office note was prepared with Dragon voice recognition system technology. Any transcriptional errors that result from this process are unintentional.

## 2018-03-09 ENCOUNTER — Other Ambulatory Visit: Payer: Self-pay

## 2018-03-09 ENCOUNTER — Encounter: Payer: Self-pay | Admitting: Endocrinology

## 2018-03-09 ENCOUNTER — Other Ambulatory Visit: Payer: Self-pay | Admitting: Endocrinology

## 2018-03-09 ENCOUNTER — Ambulatory Visit: Payer: Medicare Other | Admitting: Endocrinology

## 2018-03-09 VITALS — BP 136/78 | HR 92 | Ht 66.0 in | Wt 187.0 lb

## 2018-03-09 DIAGNOSIS — E1165 Type 2 diabetes mellitus with hyperglycemia: Secondary | ICD-10-CM

## 2018-03-09 LAB — POCT GLYCOSYLATED HEMOGLOBIN (HGB A1C): Hemoglobin A1C: 6.3 % — AB (ref 4.0–5.6)

## 2018-03-09 LAB — GLUCOSE, POCT (MANUAL RESULT ENTRY): POC Glucose: 116 mg/dL — AB (ref 70–99)

## 2018-03-09 LAB — MICROALBUMIN / CREATININE URINE RATIO
CREATININE, U: 222.6 mg/dL
MICROALB UR: 11.8 mg/dL — AB (ref 0.0–1.9)
MICROALB/CREAT RATIO: 5.3 mg/g (ref 0.0–30.0)

## 2018-03-09 MED ORDER — LANCETS SUPER THIN 28G MISC: 3 refills | Status: DC

## 2018-03-09 MED ORDER — GLUCOSE BLOOD VI STRP: 1.0000 | ORAL_STRIP | 3 refills | Status: DC | PRN

## 2018-03-09 NOTE — Patient Instructions (Addendum)
Check blood sugars on waking up  3/7 Also check blood sugars about 2 hours after a meal and do this after different meals by rotation  Recommended blood sugar levels on waking up is 90-130 and about 2 hours after meal is 130-160  Please bring your blood sugar monitor to each visit, thank you  Glimeperide 1/2 in am  More exercise

## 2018-03-24 LAB — HM DIABETES EYE EXAM

## 2018-04-02 ENCOUNTER — Encounter: Payer: Self-pay | Admitting: Internal Medicine

## 2018-04-02 ENCOUNTER — Ambulatory Visit: Payer: Medicare Other | Admitting: Internal Medicine

## 2018-04-02 ENCOUNTER — Encounter: Payer: Self-pay | Admitting: *Deleted

## 2018-04-02 VITALS — BP 118/80 | HR 79 | Ht 66.0 in | Wt 186.8 lb

## 2018-04-02 DIAGNOSIS — E782 Mixed hyperlipidemia: Secondary | ICD-10-CM | POA: Diagnosis not present

## 2018-04-02 DIAGNOSIS — R0602 Shortness of breath: Secondary | ICD-10-CM | POA: Diagnosis not present

## 2018-04-02 DIAGNOSIS — R0789 Other chest pain: Secondary | ICD-10-CM

## 2018-04-02 NOTE — Progress Notes (Signed)
Cardiology Office Note   Date:  04/02/2018   ID:  [name] 12/01/1945, MRN [id number]  PCP:  Alycia Rossetti, MD  Cardiologist:   Dorris Carnes, MD   No chief complaint on file.  Pt f/u for HTN and HL     History of Present Illness: [name] is a 72 y.o. female with a history of DOE, HTN, HL, DM, IBS.  Stress myoview 2013 was normal .  Hx of dizziness I saw her in clinic in 2016     Since seen she says she gets winded a lot   She gets winded doing housework   SOme dizziness  Also gets occasional chest discomfort   Not with every activty or every day     LDL in April 2019 was 88  HDL 47     A1C was 7.9      Current Outpatient Medications  Medication Sig Dispense Refill  . acetaminophen (TYLENOL) 325 MG tablet Take 325 mg by mouth every 6 (six) hours as needed for mild pain.     . Blood Glucose Monitoring Suppl (BLOOD GLUCOSE SYSTEM PAK) KIT Use as directed to monitor FSBS 1x daily. Dx: E11.9 1 each 1  . Cholecalciferol (VITAMIN D3 PO) Take 1 tablet by mouth daily.    Marland Kitchen CINNAMON PO Take 1 capsule by mouth. Takes sometimes    . glimepiride (AMARYL) 2 MG tablet Take 3 mg by mouth 2 (two) times daily.    Marland Kitchen glucose blood (ACCU-CHEK GUIDE) test strip 1 each by Other route as needed for other. Use as instructed to check blood sugar daily. 100 each 3  . LANCETS SUPER THIN 28G MISC Use to check blood sugar once daily. 100 each 3  . lansoprazole (PREVACID) 30 MG capsule Take 30 mg by mouth daily.    Marland Kitchen losartan (COZAAR) 25 MG tablet Take 1 tablet (25 mg total) by mouth every morning. 90 tablet 2  . metFORMIN (GLUCOPHAGE-XR) 500 MG 24 hr tablet Take 500 mg by mouth 2 (two) times daily.    . Multiple Vitamin (MULITIVITAMIN WITH MINERALS) TABS Take 1 tablet by mouth daily.    . Nebivolol HCl (BYSTOLIC) 20 MG TABS Take 1 tablet (20 mg total) by mouth 2 (two) times daily. 180 tablet 2  . tetrahydrozoline-zinc (VISINE-AC) 0.05-0.25 % ophthalmic solution Place 2 drops into both  eyes 3 (three) times daily as needed (dry eyes).    . TRULICITY 6.38 GT/3.6IW SOPN INJECT 1 PEN INTO THE ABDOMINAL SKIN ONCE WEEKLY AS DIRECTED. 2 mL 4   No current facility-administered medications for this visit.     Allergies:   Demerol [meperidine]; Bentyl [dicyclomine hcl]; Invokana [canagliflozin]; Metformin and related; Protonix [pantoprazole sodium]; Statins; and Ace inhibitors   Past Medical History:  Diagnosis Date  . Abdominal hernia 02/26/12   unrepaired  . 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 Anemia   . Anxiety   . Blood transfusion 1980's  . Complication of anesthesia    has a hard time waking up; can't lay flat  . Degenerative disc disease, lumbar    pressing on L3 and L4  . Diabetes mellitus ([place]) 1989   Type 2 NIDDM; "cancer treatment gave me diabetes"  . DJD (degenerative joint disease) of lumbar spine   . Dysrhythmia    "irregular"  . Exertional dyspnea   . GERD (gastroesophageal reflux disease)    mann  .  Heart murmur    "think I outgrew it"  . History of kidney stones   . History of stomach ulcers   . Hx: UTI (urinary tract infection)   . Hyperlipidemia   . Hypersensitivity    in tongue  . Hypertension   . IBS (irritable bowel syndrome)   . Insomnia   . Kidney cysts    RT KIDNEY  . Kidney stones 2009   s/p lithotripsy  . Obesity   . PONV (postoperative nausea and vomiting)   . Shortness of breath    unable to lie flat    Past Surgical History:  Procedure Laterality Date  . ABDOMINAL HYSTERECTOMY  2002  . APPENDECTOMY    . BACK SURGERY    . BREAST BIOPSY     right  . CATARACT EXTRACTION W/ INTRAOCULAR LENS  IMPLANT, BILATERAL  2009-2010  . CESAREAN SECTION  1973; 1978; 1981  . COLONOSCOPY WITH PROPOFOL N/A 10/29/2016   Procedure: COLONOSCOPY WITH PROPOFOL;  Surgeon: Danie Binder, MD;  [facility];  Service: Endoscopy;  Laterality: N/A;  10:00 am  . CYSTOSCOPY Left 04/13/2014    Procedure: CYSTOSCOPY FLEXIBLE;  Surgeon: Ardis Hughs, MD;  [facility];  Service: Urology;  Laterality: Left;  with STENT  . ESOPHAGOGASTRODUODENOSCOPY (EGD) WITH PROPOFOL N/A 10/29/2016   Procedure: ESOPHAGOGASTRODUODENOSCOPY (EGD) WITH PROPOFOL;  Surgeon: Danie Binder, MD;  [facility];  Service: Endoscopy;  Laterality: N/A;  . EYE SURGERY    . implant and screws put in the right lower jaw  11/2008   dental surgery  . kidney blockage  ?1990's   " major surgery ;put kidney on pump for awhile"  . LITHOTRIPSY     "several times"  . LUMBAR FUSION  08/2010  . MASTECTOMY  1997   right  . NEPHROLITHOTOMY Left 04/13/2014   Procedure: LEFT PERCUTANEOUS NEPHROLITHOTOMY WITH SURGEON ACCESS;  Surgeon: Ardis Hughs, MD;  [facility];  Service: Urology;  Laterality: Left;  . OVARIAN CYST SURGERY    . PARATHYROIDECTOMY  02/26/2012   Procedure: PARATHYROIDECTOMY;  Surgeon: Ascencion Dike, MD;  Location: [facility];  Service: ENT;;  . Azzie Almas DILATION N/A 10/29/2016   Procedure: SAVORY DILATION;  Surgeon: Danie Binder, MD;  [facility];  Service: Endoscopy;  Laterality: N/A;  . [place] SURGERY  2011  . urological surgery for blocked ureter secondary to kidney stone       Social History:  The patient  reports that she has never smoked. She has never used smokeless tobacco. She reports that she does not drink alcohol or use drugs.   Family History:  The patient's family history includes Cancer in her brother, cousin, cousin, and paternal aunt; Diabetes in her daughter; Heart attack in her mother; Leukemia in her paternal aunt; Pancreatic cancer in her paternal aunt.    ROS:  Please see the history of present illness. All other systems are reviewed and  Negative to the above problem except as noted.    PHYSICAL EXAM: VS:  BP 118/80   Pulse 79   Ht 5' 6" (1.676 m)   Wt 84.7 kg (186 lb 12.8 oz)   BMI 30.15 kg/m   BHA:LPFXT 72 yo in no acute distress  HEENT: normal   Neck: JVP normal   No , carotid bruits, or masses Cardiac: RRR; no murmurs, rubs, or gallops,no edema  Respiratory:  clear to auscultation bilaterally, normal work of breathing GI: soft, nontender,  nondistended, + BS  No hepatomegaly  MS: no deformity Moving all extremities   Skin: warm and dry, no rash Neuro:  Strength and sensation are intact Psych: euthymic mood, full affect   EKG:  EKG is ordered today. SR 79    T wave inveriosns II, III, AFV   V3 to V6   Od     Lipid Panel    Component Value Date/Time   CHOL 154 01/16/2018 0942   TRIG 98 01/16/2018 0942   HDL 47 (L) 01/16/2018 0942   CHOLHDL 3.3 01/16/2018 0942   VLDL 27 07/03/2016 1203   LDLCALC 88 01/16/2018 0942      Wt Readings from Last 3 Encounters:  04/02/18 84.7 kg (186 lb 12.8 oz)  03/09/18 84.8 kg (187 lb)  01/22/18 84.8 kg (187 lb)      ASSESSMENT AND PLAN:  1  SOB / INtermitt chest discomfort    Will set up for echo to evaluate diastolic function as well as stress myovuew   2  HTN  BP is good She questions if she has to be on all these meds   I am reluctant to cahnge   May be able to pull back on bystolic and increase losartan   Will wait until after testing     3  HL LDL is better   She is working on diet  4  DM  A1c was improved at 6.3    Follows with Dr Dwyane Dee   F/U in 1 year     Signed, Dorris Carnes, MD  04/02/2018 9:42 [address], [place], [place]  [address] Phone: [phone]; Fax: [phone]

## 2018-04-02 NOTE — Patient Instructions (Signed)
Your physician recommends that you continue on your current medications as directed. Please refer to the Current Medication list given to you today.  Your physician has requested that you have an echocardiogram. Echocardiography is a painless test that uses sound waves to create images of your heart. It provides your doctor with information about the size and shape of your heart and how well your heart's chambers and valves are working. This procedure takes approximately one hour. There are no restrictions for this procedure.  Your physician has requested that you have en exercise stress myoview. For further information please visit [email or web address]. Please follow instruction sheet, as given.  Your physician wants you to follow-up in: 1 year with Dr. Harrington Challenger.  You will receive a reminder letter in the mail two months in advance. If you don't receive a letter, please call our office to schedule the follow-up appointment.

## 2018-04-03 ENCOUNTER — Encounter: Payer: Self-pay | Admitting: *Deleted

## 2018-04-08 ENCOUNTER — Telehealth (HOSPITAL_COMMUNITY): Payer: Self-pay | Admitting: *Deleted

## 2018-04-08 NOTE — Telephone Encounter (Signed)
Patient given detailed instructions per Myocardial Perfusion Study Information Sheet for the test on 04/08/18. Patient notified to arrive 15 minutes early and that it is imperative to arrive on time for appointment to keep from having the test rescheduled.  If you need to cancel or reschedule your appointment, please call the office within 24 hours of your appointment. . Patient verbalized understanding. Kirstie Peri

## 2018-04-10 ENCOUNTER — Ambulatory Visit (HOSPITAL_COMMUNITY): Payer: Medicare Other | Attending: Cardiovascular Disease

## 2018-04-10 ENCOUNTER — Ambulatory Visit (HOSPITAL_BASED_OUTPATIENT_CLINIC_OR_DEPARTMENT_OTHER): Payer: Medicare Other

## 2018-04-10 ENCOUNTER — Other Ambulatory Visit: Payer: Self-pay

## 2018-04-10 DIAGNOSIS — R0602 Shortness of breath: Secondary | ICD-10-CM | POA: Diagnosis not present

## 2018-04-10 DIAGNOSIS — I10 Essential (primary) hypertension: Secondary | ICD-10-CM | POA: Insufficient documentation

## 2018-04-10 DIAGNOSIS — E119 Type 2 diabetes mellitus without complications: Secondary | ICD-10-CM | POA: Diagnosis not present

## 2018-04-10 DIAGNOSIS — R0789 Other chest pain: Secondary | ICD-10-CM | POA: Diagnosis present

## 2018-04-10 DIAGNOSIS — E785 Hyperlipidemia, unspecified: Secondary | ICD-10-CM | POA: Insufficient documentation

## 2018-04-10 DIAGNOSIS — F419 Anxiety disorder, unspecified: Secondary | ICD-10-CM | POA: Diagnosis not present

## 2018-04-10 DIAGNOSIS — Z853 Personal history of malignant neoplasm of breast: Secondary | ICD-10-CM | POA: Diagnosis not present

## 2018-04-10 DIAGNOSIS — D649 Anemia, unspecified: Secondary | ICD-10-CM | POA: Diagnosis not present

## 2018-04-10 DIAGNOSIS — R011 Cardiac murmur, unspecified: Secondary | ICD-10-CM | POA: Insufficient documentation

## 2018-04-10 LAB — MYOCARDIAL PERFUSION IMAGING
CHL CUP RESTING HR STRESS: 77 {beats}/min
LHR: 0.32
LVDIAVOL: 64 mL (ref 46–106)
LVSYSVOL: 23 mL
NUC STRESS TID: 0.86
Peak HR: 121 {beats}/min
SDS: 10
SRS: 4
SSS: 14

## 2018-04-10 LAB — ECHOCARDIOGRAM COMPLETE
HEIGHTINCHES: 66 in
Weight: 2976 oz

## 2018-04-10 MED ORDER — TECHNETIUM TC 99M TETROFOSMIN IV KIT
10.9000 | PACK | Freq: Once | INTRAVENOUS | Status: AC | PRN
  Administered 2018-04-10: 10.9 via INTRAVENOUS
  Filled 2018-04-10: qty 11

## 2018-04-10 MED ORDER — PERFLUTREN LIPID MICROSPHERE
1.0000 mL | INTRAVENOUS | Status: AC | PRN
  Administered 2018-04-10: 2 mL via INTRAVENOUS

## 2018-04-10 MED ORDER — TECHNETIUM TC 99M TETROFOSMIN IV KIT
31.6000 | PACK | Freq: Once | INTRAVENOUS | Status: AC | PRN
  Administered 2018-04-10: 31.6 via INTRAVENOUS
  Filled 2018-04-10: qty 32

## 2018-04-10 MED ORDER — REGADENOSON 0.4 MG/5ML IV SOLN
0.4000 mg | Freq: Once | INTRAVENOUS | Status: AC
  Administered 2018-04-10: 0.4 mg via INTRAVENOUS

## 2018-04-13 ENCOUNTER — Encounter: Payer: Medicare Other | Attending: Endocrinology | Admitting: Dietician

## 2018-04-13 ENCOUNTER — Encounter: Payer: Self-pay | Admitting: Dietician

## 2018-04-13 DIAGNOSIS — E1165 Type 2 diabetes mellitus with hyperglycemia: Secondary | ICD-10-CM | POA: Insufficient documentation

## 2018-04-13 DIAGNOSIS — Z6830 Body mass index (BMI) 30.0-30.9, adult: Secondary | ICD-10-CM | POA: Diagnosis not present

## 2018-04-13 DIAGNOSIS — Z713 Dietary counseling and surveillance: Secondary | ICD-10-CM | POA: Insufficient documentation

## 2018-04-13 NOTE — Progress Notes (Signed)
Diabetes Self-Management Education  Visit Type: First/Initial  Appt. Start Time: 1100 Appt. End Time: 1215  04/13/2018  Ms. Adlyn Fife, identified by name and date of birth, is a 72 y.o. female with a diagnosis of Diabetes: Type 2. Patient is known to myself and last saw her in October 2015.  Since that time she has also gone to diabetes classes in [place].  She states that she knows what to do but needs follow-up to create change. History includes aggressive breast cancer diagnosed in 1997 and is now in remission, HTN, hyperlipidemia, CKD, IBS (agrivated by some vegetables and grease), GERD, and very large hernia.  Sometimes the hernia interferes with eating.  She has early satiety.  She has lost 16 lbs in the past 3 1/2 years. Medications include Metformin ER, Amaryl, and Trulicity  Patient lives with her husband and daughter.  Her daughter has been diagnosed with MS.  They used to walk together but now daughter is unable to.  Patient has several pieces of exercise equipment (treatmill, stationary bike, and other equipment) but reports no motivation to exercise.  She drinks 1 can of regular coke per day as well as juice and increased amounts of fruit.   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  ASSESSMENT  Height 5' 6.5" (1.689 m), weight 189 lb (85.7 kg). Body mass index is 30.05 kg/m.  Diabetes Self-Management Education - 04/13/18 1135      Visit Information   Visit Type  First/Initial      Initial Visit   Diabetes Type  Type 2    Are you currently following a meal plan?  No    Are you taking your medications as prescribed?  Yes    Date Diagnosed  [place]   How would you rate your overall health?  Fair      Psychosocial Assessment   Patient Belief/Attitude about Diabetes  Denial defeated/burned out    Self-care  barriers  None    Self-management support  Doctor's office;Family    Other persons present  Patient    Patient Concerns  Nutrition/Meal planning;Glycemic Control;Weight Control    Special Needs  None    Preferred Learning Style  No preference indicated    Learning Readiness  Ready    How often do you need to have someone help you when you read instructions, pamphlets, or other written materials from your doctor or pharmacy?  1 - Never    What is the last grade level you completed in school?  college      Pre-Education Assessment   Patient understands the diabetes disease and treatment process.  Demonstrates understanding / competency    Patient understands incorporating nutritional management into lifestyle.  Demonstrates understanding / competency    Patient undertands incorporating physical activity into lifestyle.  Demonstrates understanding / competency    Patient understands using medications safely.  Demonstrates understanding / competency    Patient understands monitoring blood glucose, interpreting and using results  Demonstrates understanding / competency    Patient understands prevention, detection, and treatment of acute complications.  Demonstrates understanding / competency    Patient understands prevention, detection, and treatment of chronic complications.  Demonstrates understanding / competency    Patient understands how to develop strategies to address psychosocial issues.  Demonstrates understanding / competency    Patient understands  how to develop strategies to promote health/change behavior.  Demonstrates understanding / competency      Complications   Last HgB A1C per patient/outside source  6.3 % 02/2018 decreased from 7.9% 11/2017    How often do you check your blood sugar?  0 times/day (not testing) not testing as prescribed as she does not like sto stick    Have you had a dilated eye exam in the past 12 months?  Yes    Have you had a dental exam in the past 12  months?  Yes    Are you checking your feet?  Yes    How many days per week are you checking your feet?  1      Dietary Intake   Breakfast  banana, cherries, 1/2 cup regular coke, 8 ounces unsweetened apple juice OR Boost 10    Snack (morning)  occasional popcorn    Lunch  fruit OR tuna salad in pita     Snack (afternoon)  popcorn OR nuts    Dinner  baked spaghetti with garlic bread 7-8    Snack (evening)  occasional popcorn but generally tries not to eat after 10.      Exercise   Exercise Type  Light (walking / raking leaves)    How many days per week to you exercise?  1    How many minutes per day do you exercise?  10    Total minutes per week of exercise  10      Patient Education   Previous Diabetes Education  Yes (please comment) 2015 with me and 1 year ago classes at [facility]    Disease state   Definition of diabetes, type 1 and 2, and the diagnosis of diabetes    Nutrition management   Role of diet in the treatment of diabetes and the relationship between the three main macronutrients and blood glucose level;Food label reading, portion sizes and measuring food.;Meal options for control of blood glucose level and chronic complications.    Physical activity and exercise   Role of exercise on diabetes management, blood pressure control and cardiac health.    Medications  Reviewed patients medication for diabetes, action, purpose, timing of dose and side effects.    Monitoring  Purpose and frequency of SMBG.;Identified appropriate SMBG and/or A1C goals.;Daily foot exams    Acute complications  Taught treatment of hypoglycemia - the 15 rule.    Psychosocial adjustment  Role of stress on diabetes    Personal strategies to promote health  Lifestyle issues that need to be addressed for better diabetes care      Individualized Goals (developed by patient)   Nutrition  General guidelines for healthy choices and portions discussed    Physical Activity  Exercise 5-7 days per week;30 minutes per  day    Medications  take my medication as prescribed    Monitoring   test my blood glucose as discussed    Problem Solving  meal planning, motivation    Reducing Risk  increase portions of healthy fats    Health Coping  discuss diabetes with (comment)      Post-Education Assessment   Patient understands the diabetes disease and treatment process.  Demonstrates understanding / competency    Patient understands incorporating nutritional management into lifestyle.  Demonstrates understanding / competency    Patient undertands incorporating physical activity into lifestyle.  Demonstrates understanding / competency    Patient understands using medications safely.  Demonstrates understanding / competency  Patient understands monitoring blood glucose, interpreting and using results  Demonstrates understanding / competency    Patient understands prevention, detection, and treatment of acute complications.  Demonstrates understanding / competency    Patient understands prevention, detection, and treatment of chronic complications.  Demonstrates understanding / competency    Patient understands how to develop strategies to address psychosocial issues.  Demonstrates understanding / competency    Patient understands how to develop strategies to promote health/change behavior.  Needs Review      Outcomes   Expected Outcomes  Demonstrated interest in learning. Expect positive outcomes    Future DMSE  4-6 wks    Program Status  Not Completed       Individualized Plan for Diabetes Self-Management Training:   Learning Objective:  Patient will have a greater understanding of diabetes self-management. Patient education plan is to attend individual and/or group sessions per assessed needs and concerns.   Plan:   Patient Instructions  Find what motivates you for exercise. Think about how good you feel when you are done. Aim for some form of activity for 30 minutes most days. Consider not eating after  dinner.  (12 hour break between dinner and breakfast) Small amount of protein with your meals and snacks. Consider a probiotic (Florastor or other) Rethink your beverages.  (Coke 1 per day or less or try Coke for life (stevia), no juice, and choose sugar free tea, water with fruit slices or other beverages without sugar). Wean salt. (be mindful to use less)- Taste the food first. Aim for 7 hours of sleep each night.   Expected Outcomes:  Demonstrated interest in learning. Expect positive outcomes  Education material provided: ADA Diabetes: Your Take Control Guide, Food label handouts, Meal plan card, My Plate and Snack sheet  If problems or questions, patient to contact team via:  Phone  Future DSME appointment: 4-6 wks

## 2018-04-13 NOTE — Patient Instructions (Addendum)
Find what motivates you for exercise. Think about how good you feel when you are done. Aim for some form of activity for 30 minutes most days. Consider not eating after dinner.  (12 hour break between dinner and breakfast) Small amount of protein with your meals and snacks. Consider a probiotic (Florastor or other) Rethink your beverages.  (Coke 1 per day or less or try Coke for life (stevia), no juice, and choose sugar free tea, water with fruit slices or other beverages without sugar). Wean salt. (be mindful to use less)- Taste the food first. Aim for 7 hours of sleep each night.

## 2018-04-14 ENCOUNTER — Telehealth: Payer: Self-pay | Admitting: Internal Medicine

## 2018-04-14 DIAGNOSIS — R609 Edema, unspecified: Secondary | ICD-10-CM

## 2018-04-14 DIAGNOSIS — R0602 Shortness of breath: Secondary | ICD-10-CM

## 2018-04-14 NOTE — Telephone Encounter (Signed)
Pt calling and stating she is returning call to nurse.

## 2018-04-14 NOTE — Telephone Encounter (Signed)
Notes recorded by Fay Records, MD on 04/12/2018 at 10:43 PM EDT Echo shows normal pumping function of the heart There is mild relaxation abnormaltiy and echo suggests she may have some increased filling pressures (extra fluid) Recomm: Get BMET and BNP  Notes recorded by Fay Records, MD on 04/10/2018 at 7:26 PM EDT Stress test is normal No evid of abnormal blood flow to heart  Reviewed with patient. Verbalizes understanding. Will come Friday for BMET, BNP.

## 2018-04-17 ENCOUNTER — Other Ambulatory Visit: Payer: Medicare Other | Admitting: *Deleted

## 2018-04-17 DIAGNOSIS — R0602 Shortness of breath: Secondary | ICD-10-CM

## 2018-04-17 DIAGNOSIS — R609 Edema, unspecified: Secondary | ICD-10-CM

## 2018-04-18 LAB — BASIC METABOLIC PANEL
BUN/Creatinine Ratio: 7 — ABNORMAL LOW (ref 12–28)
BUN: 7 mg/dL — AB (ref 8–27)
CHLORIDE: 104 mmol/L (ref 96–106)
CO2: 25 mmol/L (ref 20–29)
Calcium: 9.6 mg/dL (ref 8.7–10.3)
Creatinine, Ser: 0.98 mg/dL (ref 0.57–1.00)
GFR calc non Af Amer: 58 mL/min/{1.73_m2} — ABNORMAL LOW (ref >59)
GFR, EST AFRICAN AMERICAN: 67 mL/min/{1.73_m2} (ref >59)
Glucose: 143 mg/dL — ABNORMAL HIGH (ref 65–99)
Potassium: 4.2 mmol/L (ref 3.5–5.2)
SODIUM: 143 mmol/L (ref 134–144)

## 2018-04-18 LAB — PRO B NATRIURETIC PEPTIDE: NT-PRO BNP: 202 pg/mL (ref 0–301)

## 2018-04-21 ENCOUNTER — Encounter: Payer: Self-pay | Admitting: Family Medicine

## 2018-04-24 ENCOUNTER — Other Ambulatory Visit: Payer: Self-pay | Admitting: *Deleted

## 2018-04-24 DIAGNOSIS — R0602 Shortness of breath: Secondary | ICD-10-CM

## 2018-04-24 MED ORDER — FUROSEMIDE 40 MG PO TABS: 40.0000 mg | ORAL_TABLET | ORAL | 3 refills | Status: DC

## 2018-04-24 MED ORDER — POTASSIUM CHLORIDE ER 10 MEQ PO TBCR: 10.0000 meq | EXTENDED_RELEASE_TABLET | ORAL | 3 refills | Status: DC

## 2018-04-24 NOTE — Progress Notes (Signed)
Notes recorded by Rodman Key, RN on 04/24/2018 at 4:55 PM EDT Reviewed with patient who verbalizes understanding and agreement with this plan. Medications sent to [facility] Repeat BMET appointment scheduled. Pt to let nurse know if she notices any difference in her SOB when comes in for lab work. ------  Notes recorded by Fay Records, MD on 04/20/2018 at 8:03 AM EDT Kidney function is OK I would try Lasix 40 mg every other day with 10 KCL   F/U with BMET in 2 wks  Pt should f/u with how feeling (Hx SOB)

## 2018-05-04 ENCOUNTER — Other Ambulatory Visit: Payer: Self-pay | Admitting: Endocrinology

## 2018-05-11 ENCOUNTER — Other Ambulatory Visit: Payer: Medicare Other | Admitting: *Deleted

## 2018-05-11 DIAGNOSIS — R0602 Shortness of breath: Secondary | ICD-10-CM

## 2018-05-11 LAB — BASIC METABOLIC PANEL
BUN/Creatinine Ratio: 11 — ABNORMAL LOW (ref 12–28)
BUN: 11 mg/dL (ref 8–27)
CALCIUM: 9.4 mg/dL (ref 8.7–10.3)
CO2: 20 mmol/L (ref 20–29)
CREATININE: 1.04 mg/dL — AB (ref 0.57–1.00)
Chloride: 106 mmol/L (ref 96–106)
GFR calc Af Amer: 62 mL/min/{1.73_m2} (ref >59)
GFR, EST NON AFRICAN AMERICAN: 54 mL/min/{1.73_m2} — AB (ref >59)
GLUCOSE: 187 mg/dL — AB (ref 65–99)
POTASSIUM: 4.2 mmol/L (ref 3.5–5.2)
Sodium: 142 mmol/L (ref 134–144)

## 2018-06-05 ENCOUNTER — Telehealth: Payer: Self-pay | Admitting: Dietician

## 2018-06-05 NOTE — Telephone Encounter (Signed)
Called patient's cell and home and left a message to have patient reschedule her appointment with me Monday.  Antonieta Iba, RD, LDN, CDE

## 2018-06-08 ENCOUNTER — Ambulatory Visit: Payer: Medicare Other | Admitting: Dietician

## 2018-06-08 ENCOUNTER — Other Ambulatory Visit (INDEPENDENT_AMBULATORY_CARE_PROVIDER_SITE_OTHER): Payer: Medicare Other

## 2018-06-08 DIAGNOSIS — E1165 Type 2 diabetes mellitus with hyperglycemia: Secondary | ICD-10-CM

## 2018-06-08 LAB — COMPREHENSIVE METABOLIC PANEL
ALBUMIN: 3.9 g/dL (ref 3.5–5.2)
ALT: 11 U/L (ref 0–35)
AST: 14 U/L (ref 0–37)
Alkaline Phosphatase: 44 U/L (ref 39–117)
BILIRUBIN TOTAL: 0.5 mg/dL (ref 0.2–1.2)
BUN: 12 mg/dL (ref 6–23)
CALCIUM: 9.5 mg/dL (ref 8.4–10.5)
CO2: 28 meq/L (ref 19–32)
Chloride: 105 mEq/L (ref 96–112)
Creatinine, Ser: 1.03 mg/dL (ref 0.40–1.20)
GFR: 67.87 mL/min (ref >60.00)
GLUCOSE: 119 mg/dL — AB (ref 70–99)
POTASSIUM: 3.6 meq/L (ref 3.5–5.1)
Sodium: 140 mEq/L (ref 135–145)
TOTAL PROTEIN: 7.7 g/dL (ref 6.0–8.3)

## 2018-06-08 LAB — HEMOGLOBIN A1C: HEMOGLOBIN A1C: 7 % — AB (ref 4.6–6.5)

## 2018-06-10 ENCOUNTER — Ambulatory Visit: Payer: Medicare Other | Admitting: Endocrinology

## 2018-06-10 VITALS — BP 126/82 | HR 78 | Resp 20 | Ht 67.0 in | Wt 189.0 lb

## 2018-06-10 DIAGNOSIS — E1165 Type 2 diabetes mellitus with hyperglycemia: Secondary | ICD-10-CM | POA: Diagnosis not present

## 2018-06-10 DIAGNOSIS — Z23 Encounter for immunization: Secondary | ICD-10-CM | POA: Diagnosis not present

## 2018-06-10 NOTE — Patient Instructions (Signed)
Less sugars

## 2018-06-10 NOTE — Progress Notes (Signed)
Patient ID: [name], female   DOB: 1946/08/24, 72 y.o.   MRN: [id number]          Reason for Appointment: Follow-up for Type 2 Diabetes  Referring physician: [facility]   History of Present Illness:          Date of diagnosis of type 2 diabetes mellitus:        Background history: She has been on metformin since diagnosis Also has been on Amaryl or glipizide in the past Has had only infrequent A1c was done according to the current records that are available, A1c has been persistently high except in 2014 when it was 7.2 She thinks she had a rash for Januvia and this was stopped in 2016  Recent history:   A1c in March was 7.9 and is had come down to 6.3 but is back up to 7% now  Non-insulin hypoglycemic drugs the patient is taking are: metformin ER 1g, Amaryl 2 mg in the morning and 2 mg at bedtime, Trulicity 3.24 mg daily  Current management, blood sugar patterns and problems identified:  She has been on Trulicity in addition to her Amaryl and metformin to help her with control and this has improved her blood sugars  However she thinks that for the first 3 days she will feel more tired after taking the injection but no nausea, usually getting her daughter to do the injection and taking it in the evenings  Despite this her weight has gone up 2 pounds.  Because of needle phobia she does not check her sugar much at all and did not bring her monitor for review  Despite instructions from the dietitian she is not able to follow her diet and is eating a lot of fruit at times along with drinking more apple juice and drinks with sugar  She does do a little walking but not much or consistently especially when she is traveling  Side effects from medications have been: Diarrhea from metformin, rash from Januvia, candidiasis from Jardiance  Compliance with the medical regimen improving Hypoglycemia: None  documented  Glucose monitoring:  done <1 times a day         Glucometer:   Accu-Chek Aviva     Blood Glucose readings once 159 pc Lab glucose nonfasting was 187 and fasting 119  Self-care: The diet that the patient has been following is: None, she periodically will eat fried food, has snacks like popcorn    Typical meal intake: Breakfast is  sometimes boost She is trying to reduce regular soft drinks but drinks apple juice              Dietician visit, most recent: 7/19               Exercise: walking some    Weight history:Maximum weight 214  Wt Readings from Last 3 Encounters:  06/10/18 189 lb (85.7 kg)  04/13/18 189 lb (85.7 kg)  04/10/18 186 lb (84.4 kg)    Glycemic control:   Lab Results  Component Value Date   HGBA1C 7.0 (H) 06/08/2018   HGBA1C 6.3 (A) 03/09/2018   HGBA1C 7.9 (H) 12/18/2017   Lab Results  Component Value Date   MICROALBUR 11.8 (H) 03/09/2018   LDLCALC 88 01/16/2018   CREATININE 1.03 06/08/2018   Lab Results  Component Value Date   MICRALBCREAT 5.3 03/09/2018    No results found for: FRUCTOSAMINE    Allergies as of 06/10/2018      Reactions  Demerol [meperidine] Other (See Comments)   Lost consciousness   Bentyl [dicyclomine Hcl] Swelling   Invokana [canagliflozin] Hives   Metformin And Related Diarrhea   Protonix [pantoprazole Sodium] Swelling   Statins Other (See Comments)   myalgia   Ace Inhibitors Cough      Medication List        Accurate as of 06/10/18 10:37 AM. Always use your most recent med list.          acetaminophen 325 MG tablet Commonly known as:  TYLENOL Take 325 mg by mouth every 6 (six) hours as needed for mild pain.   Blood Glucose System Pak Kit Use as directed to monitor FSBS 1x daily. Dx: E11.9   CINNAMON PO Take 1 capsule by mouth. Takes sometimes   furosemide 40 MG tablet Commonly known as:  LASIX Take 1 tablet (40 mg total) by mouth every other day.   glimepiride 2 MG tablet Commonly known as:  AMARYL Take 3 mg by mouth 2 (two) times daily.   glucose blood test  strip 1 each by Other route as needed for other. Use as instructed to check blood sugar daily.   LANCETS SUPER THIN 28G Misc Use to check blood sugar once daily.   lansoprazole 30 MG capsule Commonly known as:  PREVACID Take 30 mg by mouth daily.   losartan 25 MG tablet Commonly known as:  COZAAR Take 1 tablet (25 mg total) by mouth every morning.   metFORMIN 500 MG 24 hr tablet Commonly known as:  GLUCOPHAGE-XR Take 500 mg by mouth 2 (two) times daily.   multivitamin with minerals Tabs tablet Take 1 tablet by mouth daily.   Nebivolol HCl 20 MG Tabs Take 1 tablet (20 mg total) by mouth 2 (two) times daily.   potassium chloride 10 MEQ tablet Commonly known as:  K-DUR Take 1 tablet (10 mEq total) by mouth every other day.   tetrahydrozoline-zinc 0.05-0.25 % ophthalmic solution Commonly known as:  VISINE-AC Place 2 drops into both eyes 3 (three) times daily as needed (dry eyes).   TRULICITY 1.30 QM/5.7QI Sopn Generic drug:  Dulaglutide INJECT 1 PEN INTO THE ABDOMINAL SKIN ONCE WEEKLY AS DIRECTED.   VITAMIN D3 PO Take 1 tablet by mouth daily.       Allergies:  Allergies  Allergen Reactions  . Demerol [Meperidine] Other (See Comments)    Lost consciousness  . Bentyl [Dicyclomine Hcl] Swelling  . Invokana [Canagliflozin] Hives  . Metformin And Related Diarrhea  . Protonix [Pantoprazole Sodium] Swelling  . Statins Other (See Comments)    myalgia  . Ace Inhibitors Cough    Past Medical History:  Diagnosis Date  . Abdominal hernia 02/26/12   unrepaired  . 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 Anemia   . Anxiety   . Blood transfusion 1980's  . Complication of anesthesia    has a hard time waking up; can't lay flat  . Degenerative disc disease, lumbar    pressing on L3 and L4  . Diabetes mellitus ([place]) 1989   Type 2 NIDDM; "cancer treatment gave me diabetes"  . DJD (degenerative joint disease) of lumbar spine   .  Dysrhythmia    "irregular"  . Exertional dyspnea   . GERD (gastroesophageal reflux disease)    [place]  . Heart murmur    "think I outgrew it"  . History of kidney stones   . History of stomach ulcers   .  Hx: UTI (urinary tract infection)   . Hyperlipidemia   . Hypersensitivity    in tongue  . Hypertension   . IBS (irritable bowel syndrome)   . Insomnia   . Kidney cysts    RT KIDNEY  . Kidney stones 2009   s/p lithotripsy  . Obesity   . PONV (postoperative nausea and vomiting)   . Shortness of breath    unable to lie flat    Past Surgical History:  Procedure Laterality Date  . ABDOMINAL HYSTERECTOMY  2002  . APPENDECTOMY    . BACK SURGERY    . BREAST BIOPSY     right  . CATARACT EXTRACTION W/ INTRAOCULAR LENS  IMPLANT, BILATERAL  2009-2010  . CESAREAN SECTION  1973; 1978; 1981  . COLONOSCOPY WITH PROPOFOL N/A 10/29/2016   Procedure: COLONOSCOPY WITH PROPOFOL;  Surgeon: Danie Binder, MD;  [facility];  Service: Endoscopy;  Laterality: N/A;  10:00 am  . CYSTOSCOPY Left 04/13/2014   Procedure: CYSTOSCOPY FLEXIBLE;  Surgeon: Ardis Hughs, MD;  Location: [facility];  Service: Urology;  Laterality: Left;  with STENT  . ESOPHAGOGASTRODUODENOSCOPY (EGD) WITH PROPOFOL N/A 10/29/2016   Procedure: ESOPHAGOGASTRODUODENOSCOPY (EGD) WITH PROPOFOL;  Surgeon: Danie Binder, MD;  [facility];  Service: Endoscopy;  Laterality: N/A;  . EYE SURGERY    . implant and screws put in the right lower jaw  11/2008   dental surgery  . kidney blockage  ?1990's   " major surgery ;put kidney on pump for awhile"  . LITHOTRIPSY     "several times"  . LUMBAR FUSION  08/2010  . MASTECTOMY  1997   right  . NEPHROLITHOTOMY Left 04/13/2014   Procedure: LEFT PERCUTANEOUS NEPHROLITHOTOMY WITH SURGEON ACCESS;  Surgeon: Ardis Hughs, MD;  Location: [facility];  Service: Urology;  Laterality: Left;  . OVARIAN CYST SURGERY    . PARATHYROIDECTOMY  02/26/2012   Procedure:  PARATHYROIDECTOMY;  Surgeon: Ascencion Dike, MD;  Location: [facility];  Service: ENT;;  . Azzie Almas DILATION N/A 10/29/2016   Procedure: SAVORY DILATION;  Surgeon: Danie Binder, MD;  [facility];  Service: Endoscopy;  Laterality: N/A;  . [place] SURGERY  2011  . urological surgery for blocked ureter secondary to kidney stone      Family History  Problem Relation Age of Onset  . Heart attack Mother   . Cancer Brother        Esophageal; smoker; deceased 32s  . Diabetes Daughter   . Leukemia Paternal Aunt        deceased 16s  . Pancreatic cancer Paternal Aunt        deceased 52  . Cancer Paternal Aunt        GI cancer; deceased 83s  . Cancer Cousin        kidney cancer; deceased 64; pat first cousin; daughter of aunt w/ leukemia  . Cancer Cousin        colon cancer @ 62; pat first cousin; son of aunt with GI cancer  . Anesthesia problems Neg Hx     Social History:  reports that she has never smoked. She has never used smokeless tobacco. She reports that she does not drink alcohol or use drugs.   Review of Systems   Lipid history: Not on treatment, is statin intolerant LDL below 100    Lab Results  Component Value Date   CHOL 154 01/16/2018   HDL 47 (L) 01/16/2018  [place] 88 01/16/2018   TRIG 98 01/16/2018   CHOLHDL 3.3 01/16/2018           Hypertension: Blood pressure treated with losartan and Bystolic by her PCP  BP Readings from Last 3 Encounters:  06/10/18 126/82  04/02/18 118/80  03/09/18 136/78    Most recent eye exam was in 3/18  Most recent foot exam: 3/19    LABS:  Lab on 06/08/2018  Component Date Value Ref Range Status  . Sodium 06/08/2018 140  135 - 145 mEq/L Final  . Potassium 06/08/2018 3.6  3.5 - 5.1 mEq/L Final  . Chloride 06/08/2018 105  96 - 112 mEq/L Final  . CO2 06/08/2018 28  19 - 32 mEq/L Final  . Glucose, Bld 06/08/2018 119* 70 - 99 mg/dL Final  . BUN 06/08/2018 12  6 - 23 mg/dL Final  . Creatinine, Ser 06/08/2018 1.03  0.40 -  1.20 mg/dL Final  . Total Bilirubin 06/08/2018 0.5  0.2 - 1.2 mg/dL Final  . Alkaline Phosphatase 06/08/2018 44  39 - 117 U/L Final  . AST 06/08/2018 14  0 - 37 U/L Final  . ALT 06/08/2018 11  0 - 35 U/L Final  . Total Protein 06/08/2018 7.7  6.0 - 8.3 g/dL Final  . Albumin 06/08/2018 3.9  3.5 - 5.2 g/dL Final  . Calcium 06/08/2018 9.5  8.4 - 10.5 mg/dL Final  . GFR 06/08/2018 67.87  >60.00 mL/min Final  . Hgb A1c MFr Bld 06/08/2018 7.0* 4.6 - 6.5 % Final   Glycemic Control Guidelines for People with Diabetes:Non Diabetic:  <6%Goal of Therapy: <7%Additional Action Suggested:  >8%     Physical Examination:  BP 126/82 (BP Location: Left Arm, Patient Position: Sitting, Cuff Size: Normal)   Pulse 78   Resp 20   Ht _0  (1.702 m)   Wt 189 lb (85.7 kg)   SpO2 97%   BMI 29.60 kg/m       ASSESSMENT:  Diabetes type 2, recent BMI 30  See history of present illness for detailed discussion of current diabetes management, blood sugar patterns and problems identified  Her A1c is higher at 7% compared to 6.3  As discussed above she has significant difficulty with being consistent with meal planning and getting excessive simple sugars from fruits and drinks She does like to drink apple juice particularly She is still not benefiting from seeing the dietitian and does need follow-up Although she can benefit from higher dose of Trulicity currently she is complaining of some malaise after the injections for the first 3 days   PLAN:   Discussed how she can improve her diet Needs balanced meals Follow-up with dietitian More consistent monitoring to help her with compliance with diet Regular exercise with walking  Influenza vaccine given  There are no Patient Instructions on file for this visit.    Elayne Snare 06/10/2018, 10:37 AM   Note: This office note was prepared with Dragon voice recognition system technology. Any transcriptional errors that result from this process are  unintentional.

## 2018-06-29 ENCOUNTER — Other Ambulatory Visit: Payer: Self-pay | Admitting: Endocrinology

## 2018-07-27 ENCOUNTER — Other Ambulatory Visit: Payer: Self-pay | Admitting: Endocrinology

## 2018-08-03 ENCOUNTER — Encounter: Payer: Medicare Other | Admitting: Family Medicine

## 2018-08-05 ENCOUNTER — Other Ambulatory Visit: Payer: Self-pay | Admitting: Family Medicine

## 2018-08-06 ENCOUNTER — Other Ambulatory Visit: Payer: Self-pay | Admitting: Family Medicine

## 2018-08-24 ENCOUNTER — Other Ambulatory Visit: Payer: Self-pay | Admitting: Endocrinology

## 2018-08-28 ENCOUNTER — Other Ambulatory Visit: Payer: Self-pay | Admitting: Family Medicine

## 2018-08-28 ENCOUNTER — Other Ambulatory Visit: Payer: Self-pay | Admitting: Endocrinology

## 2018-09-07 ENCOUNTER — Other Ambulatory Visit (INDEPENDENT_AMBULATORY_CARE_PROVIDER_SITE_OTHER): Payer: Medicare Other

## 2018-09-07 DIAGNOSIS — E1165 Type 2 diabetes mellitus with hyperglycemia: Secondary | ICD-10-CM

## 2018-09-07 LAB — BASIC METABOLIC PANEL
BUN: 9 mg/dL (ref 6–23)
CO2: 29 mEq/L (ref 19–32)
Calcium: 9.1 mg/dL (ref 8.4–10.5)
Chloride: 104 mEq/L (ref 96–112)
Creatinine, Ser: 1.05 mg/dL (ref 0.40–1.20)
GFR: 66.33 mL/min (ref >60.00)
Glucose, Bld: 116 mg/dL — ABNORMAL HIGH (ref 70–99)
Potassium: 3.5 mEq/L (ref 3.5–5.1)
SODIUM: 140 meq/L (ref 135–145)

## 2018-09-07 LAB — HEMOGLOBIN A1C: Hgb A1c MFr Bld: 6.5 % (ref 4.6–6.5)

## 2018-09-09 ENCOUNTER — Ambulatory Visit: Payer: Medicare Other | Admitting: Endocrinology

## 2018-09-09 ENCOUNTER — Encounter: Payer: Self-pay | Admitting: Endocrinology

## 2018-09-09 VITALS — BP 138/70 | HR 81 | Ht 67.0 in | Wt 184.8 lb

## 2018-09-09 DIAGNOSIS — E1165 Type 2 diabetes mellitus with hyperglycemia: Secondary | ICD-10-CM

## 2018-09-09 MED ORDER — LINAGLIPTIN 5 MG PO TABS: 5.0000 mg | ORAL_TABLET | Freq: Every day | ORAL | 2 refills | Status: DC

## 2018-09-09 NOTE — Patient Instructions (Signed)
Check blood sugars on waking up 3 days a week  Also check blood sugars about 2 hours after meals and do this after different meals by rotation  Recommended blood sugar levels on waking up are 90-130 and about 2 hours after meal is 130-160  Please bring your blood sugar monitor to each visit, thank you   

## 2018-09-09 NOTE — Progress Notes (Signed)
Patient ID: [name], female   DOB: Jul 29, 1946, 72 y.o.   MRN: [id number]          Reason for Appointment: Follow-up for Type 2 Diabetes  Referring physician: [facility]   History of Present Illness:          Date of diagnosis of type 2 diabetes mellitus:        Background history: She has been on metformin since diagnosis Also has been on Amaryl or glipizide in the past Has had only infrequent A1c was done according to the current records that are available, A1c has been persistently high except in 2014 when it was 7.2 She thinks she had a rash for Januvia and this was stopped in 2016  Recent history:   A1c is improved at 6.5, has been as high as 7.9  Non-insulin hypoglycemic drugs : metformin ER 1g, Amaryl 2 mg in the morning and 2 mg at bedtime, Trulicity 4.68 mg daily   Current management, blood sugar patterns and problems identified:  She is again complaining of side effects from Trulicity for the first 3 days when she takes it.  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  However she thinks that it only when she had a chocolate cake her blood sugar went up to 200, after fruit he had gone up to 155 but otherwise not as high  No hypoglycemia with Amaryl  Fasting glucose in the lab was 116  Side effects from medications have been: Diarrhea from metformin, rash from Januvia, candidiasis from Jardiance  Compliance with the medical regimen improving Hypoglycemia: None  documented  Glucose monitoring:  done <1 times a day         Glucometer:  Accu-Chek Aviva     Blood Glucose readings not available  Self-care: The diet that the patient has been following is: None, she periodically will eat fried  food, has snacks like popcorn    Typical meal intake: Breakfast is  sometimes boost She is trying to reduce regular soft drinks but drinks apple juice              Dietician visit, most recent: 7/19               Exercise: walking irregularly    Weight history:Maximum weight 214  Wt Readings from Last 3 Encounters:  09/09/18 184 lb 12.8 oz (83.8 kg)  06/10/18 189 lb (85.7 kg)  04/13/18 189 lb (85.7 kg)    Glycemic control:   Lab Results  Component Value Date   HGBA1C 6.5 09/07/2018   HGBA1C 7.0 (H) 06/08/2018   HGBA1C 6.3 (A) 03/09/2018   Lab Results  Component Value Date   MICROALBUR 11.8 (H) 03/09/2018   LDLCALC 88 01/16/2018   CREATININE 1.05 09/07/2018   Lab Results  Component Value Date   MICRALBCREAT 5.3 03/09/2018    No results found for: FRUCTOSAMINE    Allergies as of 09/09/2018      Reactions   Demerol [meperidine] Other (See Comments)   Lost consciousness   Bentyl [dicyclomine Hcl] Swelling   Invokana [canagliflozin] Hives   Metformin And Related Diarrhea   Protonix [pantoprazole Sodium] Swelling  Statins Other (See Comments)   myalgia   Ace Inhibitors Cough      Medication List       Accurate as of September 09, 2018 11:09 AM. Always use your most recent med list.        acetaminophen 325 MG tablet Commonly known as:  TYLENOL Take 325 mg by mouth every 6 (six) hours as needed for mild pain.   Blood Glucose System Pak Kit Use as directed to monitor FSBS 1x daily. Dx: J50.0   BYSTOLIC 20 MG Tabs Generic drug:  Nebivolol HCl TAKE 1 TABLET BY MOUTH TWICE DAILY.   CINNAMON PO Take 1 capsule by mouth. Takes sometimes   furosemide 40 MG tablet Commonly known as:  LASIX Take 1 tablet (40 mg total) by mouth every other day.   glimepiride 2 MG tablet Commonly known as:  AMARYL Take 3 mg by mouth 2 (two) times daily.   glucose blood test strip Commonly known as:  ACCU-CHEK GUIDE 1 each by Other route as needed for other. Use as  instructed to check blood sugar daily.   LANCETS SUPER THIN 28G Misc Use to check blood sugar once daily.   lansoprazole 30 MG capsule Commonly known as:  PREVACID Take 30 mg by mouth daily.   losartan 25 MG tablet Commonly known as:  COZAAR TAKE ONE TABLET BY MOUTH EVERY MORNING.   metFORMIN 500 MG 24 hr tablet Commonly known as:  GLUCOPHAGE-XR TAKE 2 TABLETS BY MOUTH DAILY.   multivitamin with minerals Tabs tablet Take 1 tablet by mouth daily.   potassium chloride 10 MEQ tablet Commonly known as:  K-DUR Take 1 tablet (10 mEq total) by mouth every other day.   tetrahydrozoline-zinc 0.05-0.25 % ophthalmic solution Commonly known as:  VISINE-AC Place 2 drops into both eyes 3 (three) times daily as needed (dry eyes).   TRULICITY 9.38 HW/2.9HB Sopn Generic drug:  Dulaglutide INJECT 1 PEN INTO THE ABDOMINAL SKIN ONCE WEEKLY AS DIRECTED.   VITAMIN D3 PO Take 1 tablet by mouth daily.       Allergies:  Allergies  Allergen Reactions  . Demerol [Meperidine] Other (See Comments)    Lost consciousness  . Bentyl [Dicyclomine Hcl] Swelling  . Invokana [Canagliflozin] Hives  . Metformin And Related Diarrhea  . Protonix [Pantoprazole Sodium] Swelling  . Statins Other (See Comments)    myalgia  . Ace Inhibitors Cough    Past Medical History:  Diagnosis Date  . Abdominal hernia 02/26/12   unrepaired  . 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 Anemia   . Anxiety   . Blood transfusion 1980's  . Complication of anesthesia    has a hard time waking up; can't lay flat  . Degenerative disc disease, lumbar    pressing on L3 and L4  . Diabetes mellitus ([place]) 1989   Type 2 NIDDM; "cancer treatment gave me diabetes"  . DJD (degenerative joint disease) of lumbar spine   . Dysrhythmia    "irregular"  . Exertional dyspnea   . GERD (gastroesophageal reflux disease)    mann  . Heart murmur    "think I outgrew it"  . History of kidney  stones   . History of stomach ulcers   . Hx: UTI (urinary tract infection)   . Hyperlipidemia   . Hypersensitivity    in tongue  . Hypertension   . IBS (irritable bowel syndrome)   . Insomnia   .  Kidney cysts    RT KIDNEY  . Kidney stones 2009   s/p lithotripsy  . Obesity   . PONV (postoperative nausea and vomiting)   . Shortness of breath    unable to lie flat    Past Surgical History:  Procedure Laterality Date  . ABDOMINAL HYSTERECTOMY  2002  . APPENDECTOMY    . BACK SURGERY    . BREAST BIOPSY     right  . CATARACT EXTRACTION W/ INTRAOCULAR LENS  IMPLANT, BILATERAL  2009-2010  . CESAREAN SECTION  1973; 1978; 1981  . COLONOSCOPY WITH PROPOFOL N/A 10/29/2016   Procedure: COLONOSCOPY WITH PROPOFOL;  Surgeon: Danie Binder, MD;  [facility];  Service: Endoscopy;  Laterality: N/A;  10:00 am  . CYSTOSCOPY Left 04/13/2014   Procedure: CYSTOSCOPY FLEXIBLE;  Surgeon: Ardis Hughs, MD;  Location: [facility];  Service: Urology;  Laterality: Left;  with STENT  . ESOPHAGOGASTRODUODENOSCOPY (EGD) WITH PROPOFOL N/A 10/29/2016   Procedure: ESOPHAGOGASTRODUODENOSCOPY (EGD) WITH PROPOFOL;  Surgeon: Danie Binder, MD;  [facility]: [facility];  Service: Endoscopy;  Laterality: N/A;  . EYE SURGERY    . implant and screws put in the right lower jaw  11/2008   dental surgery  . kidney blockage  ?1990's   " major surgery ;put kidney on pump for awhile"  . LITHOTRIPSY     "several times"  . LUMBAR FUSION  08/2010  . MASTECTOMY  1997   right  . NEPHROLITHOTOMY Left 04/13/2014   Procedure: LEFT PERCUTANEOUS NEPHROLITHOTOMY WITH SURGEON ACCESS;  Surgeon: Ardis Hughs, MD;  Location: [facility];  Service: Urology;  Laterality: Left;  . OVARIAN CYST SURGERY    . PARATHYROIDECTOMY  02/26/2012   Procedure: PARATHYROIDECTOMY;  Surgeon: Ascencion Dike, MD;  Location: [place];  Service: ENT;;  . Azzie Almas DILATION N/A 10/29/2016   Procedure: SAVORY DILATION;  Surgeon: Danie Binder, MD;  [facility]:  [facility];  Service: Endoscopy;  Laterality: N/A;  . [place] SURGERY  2011  . urological surgery for blocked ureter secondary to kidney stone      Family History  Problem Relation Age of Onset  . Heart attack Mother   . Cancer Brother        Esophageal; smoker; deceased 89s  . Diabetes Daughter   . Leukemia Paternal Aunt        deceased 52s  . Pancreatic cancer Paternal Aunt        deceased 65  . Cancer Paternal Aunt        GI cancer; deceased 49s  . Cancer Cousin        kidney cancer; deceased 50; pat first cousin; daughter of aunt w/ leukemia  . Cancer Cousin        colon cancer @ 77; pat first cousin; son of aunt with GI cancer  . Anesthesia problems Neg Hx     Social History:  reports that she has never smoked. She has never used smokeless tobacco. She reports that she does not drink alcohol or use drugs.   Review of Systems   Lipid history: Not on treatment, is statin intolerant LDL below 100    Lab Results  Component Value Date   CHOL 154 01/16/2018   HDL 47 (L) 01/16/2018   LDLCALC 88 01/16/2018   TRIG 98 01/16/2018   CHOLHDL 3.3 01/16/2018           Hypertension: Blood pressure treated with losartan and Bystolic by her PCP  BP Readings from Last 3 Encounters:  09/09/18 138/70  06/10/18 126/82  04/02/18 118/80    Most recent eye exam was in 3/18  Most recent foot exam: 3/19    LABS:  Lab on 09/07/2018  Component Date Value Ref Range Status  . Sodium 09/07/2018 140  135 - 145 mEq/L Final  . Potassium 09/07/2018 3.5  3.5 - 5.1 mEq/L Final  . Chloride 09/07/2018 104  96 - 112 mEq/L Final  . CO2 09/07/2018 29  19 - 32 mEq/L Final  . Glucose, Bld 09/07/2018 116* 70 - 99 mg/dL Final  . BUN 09/07/2018 9  6 - 23 mg/dL Final  . Creatinine, Ser 09/07/2018 1.05  0.40 - 1.20 mg/dL Final  . Calcium 09/07/2018 9.1  8.4 - 10.5 mg/dL Final  . GFR 09/07/2018 66.33  >60.00 mL/min Final  . Hgb A1c MFr Bld 09/07/2018 6.5  4.6 - 6.5 % Final   Glycemic  Control Guidelines for People with Diabetes:Non Diabetic:  <6%Goal of Therapy: <7%Additional Action Suggested:  >8%     Physical Examination:  BP 138/70 (BP Location: Left Arm, Patient Position: Sitting, Cuff Size: Normal)   Pulse 81   Ht _0  (1.702 m)   Wt 184 lb 12.8 oz (83.8 kg)   SpO2 97%   BMI 28.94 kg/m       ASSESSMENT:  Diabetes type 2, recent BMI 29  See history of present illness for detailed discussion of current diabetes management, blood sugar patterns and problems identified  Her A1c is 6.5 and improved  She has done better with weight loss this time and is likely doing somewhat better with her diet with cutting back on sweets and high sugar drinks However she is still having intolerance to Trulicity with diarrhea and significant malaise for 3 days  HYPERTENSION: Well controlled   PLAN:   She will continue to work on her weight with diet and exercise When she finishes Trulicity she can try Tradjenta for a month and try to continue watching her portions and snacks More glucose monitoring Regular walking for exercise Follow-up in 3 months again  There are no Patient Instructions on file for this visit.    Elayne Snare 09/09/2018, 11:09 AM   Note: This office note was prepared with Dragon voice recognition system technology. Any transcriptional errors that result from this process are unintentional.

## 2018-10-12 ENCOUNTER — Other Ambulatory Visit: Payer: Self-pay | Admitting: Family Medicine

## 2018-10-15 ENCOUNTER — Other Ambulatory Visit: Payer: Self-pay | Admitting: Family Medicine

## 2018-10-26 ENCOUNTER — Other Ambulatory Visit: Payer: Self-pay | Admitting: Family Medicine

## 2018-11-10 ENCOUNTER — Other Ambulatory Visit: Payer: Self-pay | Admitting: Family Medicine

## 2018-11-16 ENCOUNTER — Other Ambulatory Visit: Payer: Self-pay | Admitting: Endocrinology

## 2018-11-20 ENCOUNTER — Inpatient Hospital Stay (HOSPITAL_COMMUNITY): Payer: Medicare Other | Admitting: Internal Medicine

## 2018-11-20 ENCOUNTER — Encounter (HOSPITAL_COMMUNITY): Payer: Self-pay

## 2018-11-20 ENCOUNTER — Other Ambulatory Visit: Payer: Self-pay

## 2018-11-20 ENCOUNTER — Inpatient Hospital Stay (HOSPITAL_COMMUNITY): Payer: Medicare Other | Attending: Hematology

## 2018-11-20 ENCOUNTER — Encounter (HOSPITAL_COMMUNITY): Payer: Self-pay | Admitting: Internal Medicine

## 2018-11-20 VITALS — BP 125/82 | HR 78 | Temp 98.2°F | Resp 18 | Wt 184.9 lb

## 2018-11-20 DIAGNOSIS — Z853 Personal history of malignant neoplasm of breast: Secondary | ICD-10-CM | POA: Diagnosis not present

## 2018-11-20 LAB — CBC WITH DIFFERENTIAL/PLATELET
Abs Immature Granulocytes: 0.02 10*3/uL (ref 0.00–0.07)
Basophils Absolute: 0.1 10*3/uL (ref 0.0–0.1)
Basophils Relative: 1 %
EOS PCT: 2 %
Eosinophils Absolute: 0.1 10*3/uL (ref 0.0–0.5)
HCT: 36.2 % (ref 36.0–46.0)
Hemoglobin: 10.9 g/dL — ABNORMAL LOW (ref 12.0–15.0)
Immature Granulocytes: 0 %
Lymphocytes Relative: 34 %
Lymphs Abs: 2.8 10*3/uL (ref 0.7–4.0)
MCH: 24.9 pg — AB (ref 26.0–34.0)
MCHC: 30.1 g/dL (ref 30.0–36.0)
MCV: 82.6 fL (ref 80.0–100.0)
Monocytes Absolute: 0.5 10*3/uL (ref 0.1–1.0)
Monocytes Relative: 7 %
Neutro Abs: 4.7 10*3/uL (ref 1.7–7.7)
Neutrophils Relative %: 56 %
Platelets: 247 10*3/uL (ref 150–400)
RBC: 4.38 MIL/uL (ref 3.87–5.11)
RDW: 15.6 % — ABNORMAL HIGH (ref 11.5–15.5)
WBC: 8.3 10*3/uL (ref 4.0–10.5)
nRBC: 0 % (ref 0.0–0.2)

## 2018-11-20 LAB — COMPREHENSIVE METABOLIC PANEL
ALT: 17 U/L (ref 0–44)
AST: 20 U/L (ref 15–41)
Albumin: 3.7 g/dL (ref 3.5–5.0)
Alkaline Phosphatase: 43 U/L (ref 38–126)
Anion gap: 9 (ref 5–15)
BUN: 8 mg/dL (ref 8–23)
CALCIUM: 9.3 mg/dL (ref 8.9–10.3)
CO2: 25 mmol/L (ref 22–32)
Chloride: 108 mmol/L (ref 98–111)
Creatinine, Ser: 0.92 mg/dL (ref 0.44–1.00)
GFR calc Af Amer: >60 mL/min (ref >60)
GFR calc non Af Amer: >60 mL/min (ref >60)
Glucose, Bld: 183 mg/dL — ABNORMAL HIGH (ref 70–99)
Potassium: 4 mmol/L (ref 3.5–5.1)
Sodium: 142 mmol/L (ref 135–145)
Total Bilirubin: 0.4 mg/dL (ref 0.3–1.2)
Total Protein: 7.6 g/dL (ref 6.5–8.1)

## 2018-11-20 LAB — LACTATE DEHYDROGENASE: LDH: 110 U/L (ref 98–192)

## 2018-11-20 NOTE — Patient Instructions (Signed)
[facility] at [facility] Discharge Instructions  You were seen by Dr. Higgs today   Thank you for choosing [facility] at [facility] to provide your oncology and hematology care.  To afford each patient quality time with our provider, please arrive at least 15 minutes before your scheduled appointment time.   If you have a lab appointment with the Cancer Center please come in thru the  Main Entrance and check in at the main information desk  You need to re-schedule your appointment should you arrive 10 or more minutes late.  We strive to give you quality time with our providers, and arriving late affects you and other patients whose appointments are after yours.  Also, if you no show three or more times for appointments you may be dismissed from the clinic at the providers discretion.     Again, thank you for choosing [facility].  Our hope is that these requests will decrease the amount of time that you wait before being seen by our physicians.       _____________________________________________________________  Should you have questions after your visit to [facility], please contact our office at [phone] between the hours of 8:00 a.m. and 4:30 p.m.  Voicemails left after 4:00 p.m. will not be returned until the following business day.  For prescription refill requests, have your pharmacy contact our office and allow 72 hours.    Cancer Center Support Programs:   > Cancer Support Group  2nd Tuesday of the month 1pm-2pm, Journey Room   

## 2018-11-24 ENCOUNTER — Encounter: Payer: Self-pay | Admitting: Gastroenterology

## 2018-11-27 NOTE — Progress Notes (Signed)
This encounter was created in error - please disregard.

## 2018-12-04 ENCOUNTER — Other Ambulatory Visit: Payer: Self-pay

## 2018-12-04 ENCOUNTER — Other Ambulatory Visit (INDEPENDENT_AMBULATORY_CARE_PROVIDER_SITE_OTHER): Payer: Medicare Other

## 2018-12-04 DIAGNOSIS — E1165 Type 2 diabetes mellitus with hyperglycemia: Secondary | ICD-10-CM | POA: Diagnosis not present

## 2018-12-04 LAB — COMPREHENSIVE METABOLIC PANEL
ALT: 15 U/L (ref 0–35)
AST: 18 U/L (ref 0–37)
Albumin: 4 g/dL (ref 3.5–5.2)
Alkaline Phosphatase: 45 U/L (ref 39–117)
BUN: 14 mg/dL (ref 6–23)
CO2: 27 mEq/L (ref 19–32)
Calcium: 9.8 mg/dL (ref 8.4–10.5)
Chloride: 105 mEq/L (ref 96–112)
Creatinine, Ser: 1.03 mg/dL (ref 0.40–1.20)
GFR: 63.76 mL/min (ref >60.00)
GLUCOSE: 168 mg/dL — AB (ref 70–99)
POTASSIUM: 4.3 meq/L (ref 3.5–5.1)
Sodium: 140 mEq/L (ref 135–145)
Total Bilirubin: 0.5 mg/dL (ref 0.2–1.2)
Total Protein: 7.8 g/dL (ref 6.0–8.3)

## 2018-12-04 LAB — HEMOGLOBIN A1C: Hgb A1c MFr Bld: 7.7 % — ABNORMAL HIGH (ref 4.6–6.5)

## 2018-12-08 ENCOUNTER — Ambulatory Visit: Payer: Medicare Other | Admitting: Endocrinology

## 2018-12-08 ENCOUNTER — Encounter: Payer: Self-pay | Admitting: Endocrinology

## 2018-12-08 ENCOUNTER — Encounter: Payer: Self-pay | Admitting: Family Medicine

## 2018-12-08 ENCOUNTER — Ambulatory Visit: Payer: Medicare Other | Admitting: Family Medicine

## 2018-12-08 ENCOUNTER — Other Ambulatory Visit: Payer: Self-pay

## 2018-12-08 VITALS — BP 130/68 | HR 82 | Temp 98.6°F | Resp 14 | Ht 67.0 in | Wt 189.0 lb

## 2018-12-08 VITALS — BP 108/60 | HR 85 | Ht 67.0 in | Wt 189.8 lb

## 2018-12-08 DIAGNOSIS — E1165 Type 2 diabetes mellitus with hyperglycemia: Secondary | ICD-10-CM

## 2018-12-08 DIAGNOSIS — R5382 Chronic fatigue, unspecified: Secondary | ICD-10-CM

## 2018-12-08 DIAGNOSIS — I10 Essential (primary) hypertension: Secondary | ICD-10-CM | POA: Diagnosis not present

## 2018-12-08 DIAGNOSIS — E1143 Type 2 diabetes mellitus with diabetic autonomic (poly)neuropathy: Secondary | ICD-10-CM

## 2018-12-08 DIAGNOSIS — E611 Iron deficiency: Secondary | ICD-10-CM

## 2018-12-08 DIAGNOSIS — E041 Nontoxic single thyroid nodule: Secondary | ICD-10-CM | POA: Diagnosis not present

## 2018-12-08 DIAGNOSIS — K580 Irritable bowel syndrome with diarrhea: Secondary | ICD-10-CM

## 2018-12-08 DIAGNOSIS — K439 Ventral hernia without obstruction or gangrene: Secondary | ICD-10-CM | POA: Diagnosis not present

## 2018-12-08 LAB — CBC WITH DIFFERENTIAL/PLATELET
Absolute Monocytes: 720 cells/uL (ref 200–950)
BASOS PCT: 1 %
Basophils Absolute: 96 cells/uL (ref 0–200)
Eosinophils Absolute: 173 cells/uL (ref 15–500)
Eosinophils Relative: 1.8 %
HCT: 34 % — ABNORMAL LOW (ref 35.0–45.0)
HEMOGLOBIN: 11.2 g/dL — AB (ref 11.7–15.5)
Lymphs Abs: 2880 cells/uL (ref 850–3900)
MCH: 25.6 pg — ABNORMAL LOW (ref 27.0–33.0)
MCHC: 32.9 g/dL (ref 32.0–36.0)
MCV: 77.8 fL — ABNORMAL LOW (ref 80.0–100.0)
MONOS PCT: 7.5 %
MPV: 11.1 fL (ref 7.5–12.5)
Neutro Abs: 5731 cells/uL (ref 1500–7800)
Neutrophils Relative %: 59.7 %
Platelets: 247 10*3/uL (ref 140–400)
RBC: 4.37 10*6/uL (ref 3.80–5.10)
RDW: 14 % (ref 11.0–15.0)
Total Lymphocyte: 30 %
WBC: 9.6 10*3/uL (ref 3.8–10.8)

## 2018-12-08 LAB — IRON,TIBC AND FERRITIN PANEL
%SAT: 18 % (calc) (ref 16–45)
Ferritin: 70 ng/mL (ref 16–288)
Iron: 55 ug/dL (ref 45–160)
TIBC: 298 mcg/dL (calc) (ref 250–450)

## 2018-12-08 LAB — TSH: TSH: 1.03 mIU/L (ref 0.40–4.50)

## 2018-12-08 LAB — VITAMIN B12: Vitamin B-12: 679 pg/mL (ref 200–1100)

## 2018-12-08 LAB — T4, FREE: Free T4: 1.1 ng/dL (ref 0.8–1.8)

## 2018-12-08 LAB — T3, FREE: T3, Free: 2.9 pg/mL (ref 2.3–4.2)

## 2018-12-08 MED ORDER — DULAGLUTIDE 0.75 MG/0.5ML ~~LOC~~ SOAJ: SUBCUTANEOUS | 3 refills | Status: DC

## 2018-12-08 NOTE — Progress Notes (Signed)
Patient ID: [name], female   DOB: 21-Mar-1946, 73 y.o.   MRN: [id number]          Reason for Appointment: Follow-up for Type 2 Diabetes  Referring physician: [facility]   History of Present Illness:          Date of diagnosis of type 2 diabetes mellitus:        Background history: She has been on metformin since diagnosis Also has been on Amaryl or glipizide in the past Has had only infrequent A1c was done according to the current records that are available, A1c has been persistently high except in 2014 when it was 7.2 She thinks she had a rash for Januvia and this was stopped in 2016  Recent history:   A1c is 7.7, previously was at 6.5, has been as high as 7.9  Non-insulin hypoglycemic drugs : metformin ER 1g, Amaryl 2 mg in the morning and 2 mg at bedtime, Tradjenta 5 mg daily   Current management, blood sugar patterns and problems identified:  She is taking Tradjenta and start Trulicity since December because of GI side effects and malaise with Trulicity  She is not aware of her blood sugars going up as she does not check her blood sugars at home and does not remember when she last checked it  Her weight has recently gone up  She was previously walking somewhat but not doing any exercise recently  Still not avoiding regular soft drinks and today had half a [facility]  Her lab glucose readings recently have been between 168 and 233, 3 readings available including this morning after breakfast  Has not been able to take more than 2 tablets of metformin usually  Side effects from medications have been: Diarrhea from metformin, rash from Januvia, candidiasis from Jardiance  Compliance with the medical regimen improving Hypoglycemia: None  documented  Glucose monitoring:  done <1 times a day         Glucometer:  Accu-Chek Aviva     Blood Glucose readings: None  Self-care: The diet that the patient has been following is: None, she periodically will eat fried food,  has snacks like popcorn    Typical meal intake: Breakfast is variable She is trying to reduce regular soft drinks, sometimes will have apple juice              Dietician visit, most recent: 7/19               Exercise: not walking   recently  Weight history:Maximum weight 214  Wt Readings from Last 3 Encounters:  12/08/18 189 lb 12.8 oz (86.1 kg)  11/20/18 184 lb 14.4 oz (83.9 kg)  09/09/18 184 lb 12.8 oz (83.8 kg)    Glycemic control:   Lab Results  Component Value Date   HGBA1C 7.7 (H) 12/04/2018   HGBA1C 6.5 09/07/2018   HGBA1C 7.0 (H) 06/08/2018   Lab Results  Component Value Date   MICROALBUR 11.8 (H) 03/09/2018   LDLCALC 88 01/16/2018   CREATININE 1.03 12/04/2018   Lab Results  Component Value Date   MICRALBCREAT 5.3 03/09/2018    No results found for: FRUCTOSAMINE    Allergies as of 12/08/2018      Reactions   Demerol [meperidine] Other (See Comments)   Lost consciousness   Bentyl [dicyclomine Hcl] Swelling   Invokana [canagliflozin] Hives   Metformin And Related Diarrhea   Protonix [pantoprazole Sodium] Swelling   Statins Other (See Comments)   myalgia  Ace Inhibitors Cough      Medication List       Accurate as of December 08, 2018 12:00 PM. Always use your most recent med list.        acetaminophen 325 MG tablet Commonly known as:  TYLENOL Take 325 mg by mouth every 6 (six) hours as needed for mild pain.   Blood Glucose System Pak Kit Use as directed to monitor FSBS 1x daily. Dx: D64.3   Bystolic 20 MG Tabs Generic drug:  Nebivolol HCl TAKE 1 TABLET BY MOUTH TWICE DAILY.   CINNAMON PO Take 1 capsule by mouth. Takes sometimes   Dulaglutide 0.75 MG/0.5ML Sopn Commonly known as:  Trulicity Inject in the abdominal skin as directed once a week   furosemide 40 MG tablet Commonly known as:  LASIX Take 1 tablet (40 mg total) by mouth every other day.   glimepiride 2 MG tablet Commonly known as:  AMARYL TAKE 2 TABLETS BY MOUTH EVERY  MORNING AND 1 TABLET AT BEDTIME.   glucose blood test strip Commonly known as:  Accu-Chek Guide 1 each by Other route as needed for other. Use as instructed to check blood sugar daily.   Lancets Super Thin 28G Misc Use to check blood sugar once daily.   losartan 25 MG tablet Commonly known as:  COZAAR TAKE ONE TABLET BY MOUTH EVERY MORNING.   metFORMIN 500 MG 24 hr tablet Commonly known as:  GLUCOPHAGE-XR TAKE 2 TABLETS BY MOUTH DAILY.   multivitamin with minerals Tabs tablet Take 1 tablet by mouth daily.   potassium chloride 10 MEQ tablet Commonly known as:  K-DUR Take 1 tablet (10 mEq total) by mouth every other day.   tetrahydrozoline-zinc 0.05-0.25 % ophthalmic solution Commonly known as:  VISINE-AC Place 2 drops into both eyes 3 (three) times daily as needed (dry eyes).   Tradjenta 5 MG Tabs tablet Generic drug:  linagliptin TAKE 1 TABLET BY MOUTH DAILY   VITAMIN D3 PO Take 1 tablet by mouth daily.       Allergies:  Allergies  Allergen Reactions  . Demerol [Meperidine] Other (See Comments)    Lost consciousness  . Bentyl [Dicyclomine Hcl] Swelling  . Invokana [Canagliflozin] Hives  . Metformin And Related Diarrhea  . Protonix [Pantoprazole Sodium] Swelling  . Statins Other (See Comments)    myalgia  . Ace Inhibitors Cough    Past Medical History:  Diagnosis Date  . Abdominal hernia 02/26/12   unrepaired  . 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 Anemia   . Anxiety   . Blood transfusion 1980's  . Complication of anesthesia    has a hard time waking up; can't lay flat  . Degenerative disc disease, lumbar    pressing on L3 and L4  . Diabetes mellitus ([place]) 1989   Type 2 NIDDM; "cancer treatment gave me diabetes"  . DJD (degenerative joint disease) of lumbar spine   . Dysrhythmia    "irregular"  . Exertional dyspnea   . GERD (gastroesophageal reflux disease)    mann  . Heart murmur    "think I outgrew it"   . History of kidney stones   . History of stomach ulcers   . Hx: UTI (urinary tract infection)   . Hyperlipidemia   . Hypersensitivity    in tongue  . Hypertension   . IBS (irritable bowel syndrome)   . Insomnia   . Kidney cysts  RT KIDNEY  . Kidney stones 2009   s/p lithotripsy  . Obesity   . PONV (postoperative nausea and vomiting)   . Shortness of breath    unable to lie flat    Past Surgical History:  Procedure Laterality Date  . ABDOMINAL HYSTERECTOMY  2002  . APPENDECTOMY    . BACK SURGERY    . BREAST BIOPSY     right  . CATARACT EXTRACTION W/ INTRAOCULAR LENS  IMPLANT, BILATERAL  2009-2010  . CESAREAN SECTION  1973; 1978; 1981  . COLONOSCOPY WITH PROPOFOL N/A 10/29/2016   Procedure: COLONOSCOPY WITH PROPOFOL;  Surgeon: Danie Binder, MD;  [facility];  Service: Endoscopy;  Laterality: N/A;  10:00 am  . CYSTOSCOPY Left 04/13/2014   Procedure: CYSTOSCOPY FLEXIBLE;  Surgeon: Ardis Hughs, MD;  Location: [facility];  Service: Urology;  Laterality: Left;  with STENT  . ESOPHAGOGASTRODUODENOSCOPY (EGD) WITH PROPOFOL N/A 10/29/2016   Procedure: ESOPHAGOGASTRODUODENOSCOPY (EGD) WITH PROPOFOL;  Surgeon: Danie Binder, MD;  [facility];  Service: Endoscopy;  Laterality: N/A;  . EYE SURGERY    . implant and screws put in the right lower jaw  11/2008   dental surgery  . kidney blockage  ?1990's   " major surgery ;put kidney on pump for awhile"  . LITHOTRIPSY     "several times"  . LUMBAR FUSION  08/2010  . MASTECTOMY  1997   right  . NEPHROLITHOTOMY Left 04/13/2014   Procedure: LEFT PERCUTANEOUS NEPHROLITHOTOMY WITH SURGEON ACCESS;  Surgeon: Ardis Hughs, MD;  Location: [facility];  Service: Urology;  Laterality: Left;  . OVARIAN CYST SURGERY    . PARATHYROIDECTOMY  02/26/2012   Procedure: PARATHYROIDECTOMY;  Surgeon: Ascencion Dike, MD;  Location: [facility];  Service: ENT;;  . Azzie Almas DILATION N/A 10/29/2016   Procedure: SAVORY DILATION;  Surgeon: Danie Binder, MD;  [facility];  Service: Endoscopy;  Laterality: N/A;  . [place] SURGERY  2011  . urological surgery for blocked ureter secondary to kidney stone      Family History  Problem Relation Age of Onset  . Heart attack Mother   . Cancer Brother        Esophageal; smoker; deceased 95s  . Diabetes Daughter   . Leukemia Paternal Aunt        deceased 22s  . Pancreatic cancer Paternal Aunt        deceased 35  . Cancer Paternal Aunt        GI cancer; deceased 83s  . Cancer Cousin        kidney cancer; deceased 19; pat first cousin; daughter of aunt w/ leukemia  . Cancer Cousin        colon cancer @ 57; pat first cousin; son of aunt with GI cancer  . Anesthesia problems Neg Hx     Social History:  reports that she has never smoked. She has never used smokeless tobacco. She reports that she does not drink alcohol or use drugs.   Review of Systems   Lipid history: Not on treatment, is statin intolerant LDL below 100    Lab Results  Component Value Date   CHOL 154 01/16/2018   HDL 47 (L) 01/16/2018   LDLCALC 88 01/16/2018   TRIG 98 01/16/2018   CHOLHDL 3.3 01/16/2018           Hypertension: Blood pressure treated with losartan and Bystolic by her PCP and is going to follow-up today  BP Readings from Last 3 Encounters:  12/08/18 108/60  11/20/18 125/82  09/09/18 138/70    Most recent eye exam was in 3/18  Most recent foot exam: 3/19  She has recurrent problems with abdominal discomfort and occasional diarrhea also which is longstanding  LABS:  Lab on 12/04/2018  Component Date Value Ref Range Status  . Sodium 12/04/2018 140  135 - 145 mEq/L Final  . Potassium 12/04/2018 4.3  3.5 - 5.1 mEq/L Final  . Chloride 12/04/2018 105  96 - 112 mEq/L Final  . CO2 12/04/2018 27  19 - 32 mEq/L Final  . Glucose, Bld 12/04/2018 168* 70 - 99 mg/dL Final  . BUN 12/04/2018 14  6 - 23 mg/dL Final  . Creatinine, Ser 12/04/2018 1.03  0.40 - 1.20 mg/dL Final  . Total  Bilirubin 12/04/2018 0.5  0.2 - 1.2 mg/dL Final  . Alkaline Phosphatase 12/04/2018 45  39 - 117 U/L Final  . AST 12/04/2018 18  0 - 37 U/L Final  . ALT 12/04/2018 15  0 - 35 U/L Final  . Total Protein 12/04/2018 7.8  6.0 - 8.3 g/dL Final  . Albumin 12/04/2018 4.0  3.5 - 5.2 g/dL Final  . Calcium 12/04/2018 9.8  8.4 - 10.5 mg/dL Final  . GFR 12/04/2018 63.76  >60.00 mL/min Final  . Hgb A1c MFr Bld 12/04/2018 7.7* 4.6 - 6.5 % Final   Glycemic Control Guidelines for People with Diabetes:Non Diabetic:  <6%Goal of Therapy: <7%Additional Action Suggested:  >8%     Physical Examination:  BP 108/60 (BP Location: Left Arm, Patient Position: Sitting, Cuff Size: Normal)   Pulse 85   Ht 5' 7" (1.702 m)   Wt 189 lb 12.8 oz (86.1 kg)   SpO2 97%   BMI 29.73 kg/m       ASSESSMENT:  Diabetes type 2, mild obesity  See history of present illness for detailed discussion of current diabetes management, blood sugar patterns and problems identified  Her A1c is significantly higher at 7.7 with not taking Trulicity  She has also gained weight Currently not exercising and do better with her diet also  Had a long and detailed discussion with the patient about treatment options to improve her diabetes control She is very reluctant to take SGLT2 drugs again because of recurrent yeast infections Discussed options for using various GLP-1 drugs but because of her needle phobia most likely can only take Trulicity with the most easy to use injection device Patient had several questions about treatment, side effects and current ongoing GI problems which were discussed A1c targets discussed Discussed need to check her blood sugars at home which she is still very reluctant to do  HYPERTENSION: Appears controlled with low normal blood pressure and she will follow-up with her PCP today   PLAN:    She needs to start walking for exercise Cut back further on regular soft drinks Check blood sugars as discussed  above Go back to Trulicity 0.93 mg weekly instead of Tradjenta May reduce metformin to 1 tablet when starting Trulicity to minimize GI side effects She needs to discuss her continued abdominal discomfort and diarrhea problems with gastroenterologist Follow-up in 2 months for review  Patient Instructions  Metformin 1 daily at dinner with Trulicity Walk daily  Counseling time on subjects discussed in assessment and plan sections is over 50% of today's 25 minute visit    Elayne Snare 12/08/2018, 12:00 PM   Note: This office note was prepared with Dragon voice  recognition system technology. Any transcriptional errors that result from this process are unintentional.

## 2018-12-08 NOTE — Patient Instructions (Signed)
Metformin 1 daily at dinner with Trulicity Walk daily

## 2018-12-08 NOTE — Progress Notes (Signed)
Subjective:    Patient ID: [name], female    DOB: 11-Jan-1946, 73 y.o.   MRN: [id number]  Patient presents for Fatigue (states that she has no energy and constantly feels like her stomach is upset after eating)  Patient here to follow-up chronic medical problems.  She was last seen in April 2019.  She is followed by oncology, endocrinology, I ear nose and throat and cardiology.    DM- seen by Endocrinology this morning , metformin being reduced to 500mg  once a day , stopping tradjenta she is also restarting Trulicity once a week.  She has chronic GI problems with irritable bowels which is been diagnosed in the past by Dr. Oneida Alar GI.  She needs to make a follow-up with them.  She will often have loose stools as well as constipation she feels good until she eats then she will start to feel bad and have cramping in multiple bowel movements after eating.  She also gets pressure from her known ventral hernia which is been recommended that she not have any surgical intervention on.  She often gets significant bloating.  she admits to not eating well over the holidays too many baked goods    History of anemia her last hemoglobin was Hb 10.9 she does have history of iron deficiency has not had her iron levels checked in a while.   Dr. Benjamine Mola- checking thyroid levels , has not done TSH done to make a follow-up with him.  Does have history of thyroid nodules which were being followed.   Due for Mammogram - SCHEDULED by oncology for april   Cardiology - due in July 2065m, taking lasix a couple times a week , feels that she does not have any significant fluid overload.  Reviewed the labs performed by her endocrinologist.  Note she often complains that she is tired and has no energy.  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  She also has a daughter with multiple sclerosis whom she helps.      Review Of Systems:  GEN- + fatigue, fever,  weight loss,weakness, recent illness HEENT- denies eye drainage, change in vision, nasal discharge, CVS- denies chest pain, palpitations RESP- denies SOB, cough, wheeze ABD- denies N/V, change in stools, abd pain GU- denies dysuria, hematuria, dribbling, incontinence MSK- denies joint pain, muscle aches, injury Neuro- denies headache, dizziness, syncope, seizure activity       Objective:    BP 130/68   Pulse 82   Temp 98.6 F (37 C) (Oral)   Resp 14   Ht 5\' 7"  (1.702 m)   Wt 189 lb (85.7 kg)   SpO2 98%   BMI 29.60 kg/m  GEN- NAD, alert and oriented x3 HEENT- PERRL, EOMI, non injected sclera, pink conjunctiva, MMM, oropharynx clear Neck- Supple, no thyromegaly, no bruit  CVS- RRR, no murmur RESP-CTAB ABD-NABS,soft,NT,ventral hernia, has gravid appearing abdomen, mild TTP Midline, no incarceration  EXT- No edema Pulses- Radial, DP- 2+        Assessment & Plan:      Problem List Items Addressed This Visit      Unprioritized   Chronic fatigue    Unchanged, has complained for many years Will check B12 and iron stores based on anemia history Renal function okay       Diabetic neuropathy (HCC)   Essential hypertension    Controlled no changes       IBS (irritable bowel  syndrome)   Iron deficiency - Primary   Relevant Orders   CBC with Differential/Platelet (Completed)   Iron, TIBC and Ferritin Panel (Completed)   Vitamin B12 (Completed)   Thyroid nodule    Check TFT      Relevant Orders   TSH (Completed)   T3, free (Completed)   T4, free (Completed)   Uncontrolled type 2 diabetes mellitus with hyperglycemia, without long-term current use of insulin ([place])    Treatment per endocrinology Discussed taking meds as prescribed, see how GI affects go      Ventral hernia    Worsened by bowel issues, she is going to call and schedule with GI Has been on multiple meds in the past At this time nothing specific to be done with hernia         Note: This  dictation was prepared with Dragon dictation along with smaller phrase technology. Any transcriptional errors that result from this process are unintentional.

## 2018-12-08 NOTE — Patient Instructions (Signed)
Take the diabetes medications as prescribed by Dr. Dwyane Dee We will call with lab results Follow up with Dr. Oneida Alar  F/U 4 months

## 2018-12-09 ENCOUNTER — Encounter: Payer: Self-pay | Admitting: Family Medicine

## 2018-12-09 NOTE — Assessment & Plan Note (Signed)
Unchanged, has complained for many years Will check B12 and iron stores based on anemia history Renal function okay

## 2018-12-09 NOTE — Assessment & Plan Note (Signed)
Controlled no changes 

## 2018-12-09 NOTE — Assessment & Plan Note (Signed)
Worsened by bowel issues, she is going to call and schedule with GI Has been on multiple meds in the past At this time nothing specific to be done with hernia

## 2018-12-09 NOTE — Assessment & Plan Note (Signed)
Check TFT  

## 2018-12-09 NOTE — Assessment & Plan Note (Signed)
Treatment per endocrinology Discussed taking meds as prescribed, see how GI affects go

## 2018-12-11 ENCOUNTER — Other Ambulatory Visit: Payer: Self-pay | Admitting: Family Medicine

## 2018-12-11 ENCOUNTER — Telehealth: Payer: Self-pay | Admitting: Endocrinology

## 2018-12-11 NOTE — Telephone Encounter (Signed)
error 

## 2018-12-28 ENCOUNTER — Ambulatory Visit (HOSPITAL_COMMUNITY): Payer: Medicare Other

## 2018-12-31 ENCOUNTER — Ambulatory Visit (HOSPITAL_COMMUNITY): Payer: Medicare Other | Admitting: Nurse Practitioner

## 2019-01-14 ENCOUNTER — Encounter: Payer: Self-pay | Admitting: Gastroenterology

## 2019-01-18 ENCOUNTER — Ambulatory Visit (HOSPITAL_COMMUNITY): Payer: Medicare Other

## 2019-01-20 ENCOUNTER — Ambulatory Visit (HOSPITAL_COMMUNITY): Payer: Medicare Other | Admitting: Nurse Practitioner

## 2019-01-28 ENCOUNTER — Ambulatory Visit: Payer: Medicare Other | Admitting: Gastroenterology

## 2019-02-03 ENCOUNTER — Ambulatory Visit (HOSPITAL_COMMUNITY)
Admission: RE | Admit: 2019-02-03 | Discharge: 2019-02-03 | Disposition: A | Discharge status: Home / Self Care | Payer: Medicare Other | Source: Ambulatory Visit | Attending: Internal Medicine | Admitting: Internal Medicine

## 2019-02-03 ENCOUNTER — Other Ambulatory Visit: Payer: Self-pay

## 2019-02-03 DIAGNOSIS — Z853 Personal history of malignant neoplasm of breast: Secondary | ICD-10-CM | POA: Insufficient documentation

## 2019-02-03 DIAGNOSIS — Z1231 Encounter for screening mammogram for malignant neoplasm of breast: Secondary | ICD-10-CM | POA: Insufficient documentation

## 2019-02-04 ENCOUNTER — Ambulatory Visit: Payer: Medicare Other | Admitting: Gastroenterology

## 2019-02-08 ENCOUNTER — Other Ambulatory Visit: Payer: Self-pay | Admitting: Family Medicine

## 2019-02-08 ENCOUNTER — Other Ambulatory Visit: Payer: Self-pay | Admitting: Endocrinology

## 2019-02-10 ENCOUNTER — Ambulatory Visit: Payer: Medicare Other | Admitting: Endocrinology

## 2019-02-12 ENCOUNTER — Telehealth: Payer: Self-pay | Admitting: Endocrinology

## 2019-02-12 NOTE — Telephone Encounter (Signed)
Attempted to call patient and Reschedule NO SHOW Appt 02/10/2019, called and was unable to leave message

## 2019-02-24 ENCOUNTER — Other Ambulatory Visit (HOSPITAL_COMMUNITY): Payer: Self-pay | Admitting: Nurse Practitioner

## 2019-02-24 DIAGNOSIS — Z1231 Encounter for screening mammogram for malignant neoplasm of breast: Secondary | ICD-10-CM

## 2019-03-02 ENCOUNTER — Other Ambulatory Visit: Payer: Self-pay | Admitting: Endocrinology

## 2019-04-08 ENCOUNTER — Other Ambulatory Visit: Payer: Self-pay

## 2019-04-09 ENCOUNTER — Encounter: Payer: Self-pay | Admitting: Family Medicine

## 2019-04-09 ENCOUNTER — Ambulatory Visit: Payer: Medicare Other | Admitting: Family Medicine

## 2019-04-09 VITALS — BP 122/78 | HR 80 | Temp 98.3°F | Resp 16 | Ht 66.0 in | Wt 193.1 lb

## 2019-04-09 DIAGNOSIS — I10 Essential (primary) hypertension: Secondary | ICD-10-CM | POA: Diagnosis not present

## 2019-04-09 DIAGNOSIS — E611 Iron deficiency: Secondary | ICD-10-CM

## 2019-04-09 DIAGNOSIS — E1165 Type 2 diabetes mellitus with hyperglycemia: Secondary | ICD-10-CM

## 2019-04-09 DIAGNOSIS — E1143 Type 2 diabetes mellitus with diabetic autonomic (poly)neuropathy: Secondary | ICD-10-CM | POA: Diagnosis not present

## 2019-04-09 DIAGNOSIS — E041 Nontoxic single thyroid nodule: Secondary | ICD-10-CM

## 2019-04-09 DIAGNOSIS — R5382 Chronic fatigue, unspecified: Secondary | ICD-10-CM

## 2019-04-09 NOTE — Progress Notes (Signed)
   Subjective:    Patient ID: [name], female    DOB: [birth year], 73 y.o.   MRN: [id number]  Patient presents for Diabetes   Pt here to f/u chronic medical problems    DM- last A1C 7.7%, followed by Dr. Dwyane Dee overdue for fasting labs   Hyperlipidemia- last LDL 98  Left hand Middle finger causes more pain, feels she may have arthritis, still able to use hand   Thyroid nodulkes due for repeat US  Review Of Systems:  GEN-+ fatigue,denies  fever, weight loss,weakness, recent illness HEENT- denies eye drainage, change in vision, nasal discharge, CVS- denies chest pain, palpitations RESP- denies SOB, cough, wheeze ABD- denies N/V, change in stools, abd pain GU- denies dysuria, hematuria, dribbling, incontinence MSK- denies joint pain, muscle aches, injury Neuro- denies headache, dizziness, syncope, seizure activity       Objective:    BP 122/78   Pulse 80   Temp 98.3 F (36.8 C) (Oral)   Resp 16   Ht 5\' 6"  (1.676 m)   Wt 193 lb 2 oz (87.6 kg)   SpO2 98%   BMI 31.17 kg/m  GEN- NAD, alert and oriented x3 HEENT- PERRL, EOMI, non injected sclera, pink conjunctiva, MMM, oropharynx clear Neck- Supple, no thyromegaly CVS- RRR, no murmur RESP-CTAB ABD-NABS,soft,NT,ND, ventral hernia EXT- No edema Pulses- Radial, DP- 2+        Assessment & Plan:      Problem List Items Addressed This Visit      Unprioritized   Chronic fatigue    No particular cause of her always being "tired", likley MTF with age, her comorbidites, meds      Diabetic neuropathy ([place])   Essential hypertension - Primary    Controlled no changes       Relevant Orders   CBC with Differential/Platelet (Completed)   Comprehensive metabolic panel (Completed)   Lipid panel (Completed)   Iron deficiency   Thyroid nodule    Due for repeat US      Uncontrolled type 2 diabetes mellitus with hyperglycemia, without long-term current use of insulin (HCC)    Check A1C and forward to her  endocrinologist, states she is taking meds  Did not bring meter  She is going to reschedule with them due to COVID      Relevant Orders   Hemoglobin A1c (Completed)   Lipid panel (Completed)      Note: This dictation was prepared with Dragon dictation along with smaller phrase technology. Any transcriptional errors that result from this process are unintentional.

## 2019-04-09 NOTE — Patient Instructions (Signed)
We will call with lab results F/U  28months for physical

## 2019-04-10 LAB — LIPID PANEL
Cholesterol: 204 mg/dL — ABNORMAL HIGH (ref <200)
HDL: 56 mg/dL (ref >=50)
LDL Cholesterol (Calc): 121 mg/dL (calc) — ABNORMAL HIGH
Non-HDL Cholesterol (Calc): 148 mg/dL (calc) — ABNORMAL HIGH (ref <130)
Total CHOL/HDL Ratio: 3.6 (calc) (ref <5.0)
Triglycerides: 159 mg/dL — ABNORMAL HIGH (ref <150)

## 2019-04-10 LAB — CBC WITH DIFFERENTIAL/PLATELET
Absolute Monocytes: 708 cells/uL (ref 200–950)
Basophils Absolute: 58 cells/uL (ref 0–200)
Basophils Relative: 0.6 %
Eosinophils Absolute: 291 cells/uL (ref 15–500)
Eosinophils Relative: 3 %
HCT: 35.4 % (ref 35.0–45.0)
Hemoglobin: 11.6 g/dL — ABNORMAL LOW (ref 11.7–15.5)
Lymphs Abs: 3356 cells/uL (ref 850–3900)
MCH: 25.8 pg — ABNORMAL LOW (ref 27.0–33.0)
MCHC: 32.8 g/dL (ref 32.0–36.0)
MCV: 78.8 fL — ABNORMAL LOW (ref 80.0–100.0)
MPV: 11.2 fL (ref 7.5–12.5)
Monocytes Relative: 7.3 %
Neutro Abs: 5287 cells/uL (ref 1500–7800)
Neutrophils Relative %: 54.5 %
Platelets: 241 10*3/uL (ref 140–400)
RBC: 4.49 10*6/uL (ref 3.80–5.10)
RDW: 14.3 % (ref 11.0–15.0)
Total Lymphocyte: 34.6 %
WBC: 9.7 10*3/uL (ref 3.8–10.8)

## 2019-04-10 LAB — HEMOGLOBIN A1C
Hgb A1c MFr Bld: 8.2 % of total Hgb — ABNORMAL HIGH (ref <5.7)
Mean Plasma Glucose: 189 (calc)
eAG (mmol/L): 10.4 (calc)

## 2019-04-10 LAB — COMPREHENSIVE METABOLIC PANEL
AG Ratio: 1.1 (calc) (ref 1.0–2.5)
ALT: 17 U/L (ref 6–29)
AST: 18 U/L (ref 10–35)
Albumin: 3.8 g/dL (ref 3.6–5.1)
Alkaline phosphatase (APISO): 45 U/L (ref 37–153)
BUN/Creatinine Ratio: 11 (calc) (ref 6–22)
BUN: 12 mg/dL (ref 7–25)
CO2: 25 mmol/L (ref 20–32)
Calcium: 9.4 mg/dL (ref 8.6–10.4)
Chloride: 104 mmol/L (ref 98–110)
Creat: 1.08 mg/dL — ABNORMAL HIGH (ref 0.60–0.93)
Globulin: 3.6 g/dL (calc) (ref 1.9–3.7)
Glucose, Bld: 196 mg/dL — ABNORMAL HIGH (ref 65–99)
Potassium: 4.3 mmol/L (ref 3.5–5.3)
Sodium: 139 mmol/L (ref 135–146)
Total Bilirubin: 0.5 mg/dL (ref 0.2–1.2)
Total Protein: 7.4 g/dL (ref 6.1–8.1)

## 2019-04-11 ENCOUNTER — Encounter: Payer: Self-pay | Admitting: Family Medicine

## 2019-04-11 NOTE — Assessment & Plan Note (Signed)
No particular cause of her always being "tired", likley MTF with age, her comorbidites, meds

## 2019-04-11 NOTE — Assessment & Plan Note (Signed)
Due for repeat US

## 2019-04-11 NOTE — Assessment & Plan Note (Signed)
Controlled no changes 

## 2019-04-11 NOTE — Assessment & Plan Note (Signed)
Check A1C and forward to her endocrinologist, states she is taking meds  Did not bring meter  She is going to reschedule with them due to COVID

## 2019-04-13 ENCOUNTER — Other Ambulatory Visit: Payer: Self-pay | Admitting: Family Medicine

## 2019-04-13 DIAGNOSIS — E041 Nontoxic single thyroid nodule: Secondary | ICD-10-CM

## 2019-04-13 NOTE — Progress Notes (Signed)
Order changed from an US soft tissue head and neck non thyroid to a thyroid ultrasound as we are following up on a thyroid nodule

## 2019-04-14 ENCOUNTER — Ambulatory Visit: Payer: Medicare Other | Admitting: Endocrinology

## 2019-04-16 ENCOUNTER — Ambulatory Visit (HOSPITAL_COMMUNITY)
Admission: RE | Admit: 2019-04-16 | Discharge: 2019-04-16 | Disposition: A | Discharge status: Home / Self Care | Payer: Medicare Other | Source: Ambulatory Visit | Attending: Family Medicine | Admitting: Family Medicine

## 2019-04-16 ENCOUNTER — Other Ambulatory Visit: Payer: Medicare Other

## 2019-04-16 ENCOUNTER — Other Ambulatory Visit: Payer: Self-pay

## 2019-04-16 DIAGNOSIS — Z20822 Contact with and (suspected) exposure to covid-19: Secondary | ICD-10-CM

## 2019-04-16 DIAGNOSIS — E041 Nontoxic single thyroid nodule: Secondary | ICD-10-CM

## 2019-04-19 LAB — NOVEL CORONAVIRUS, NAA: SARS-CoV-2, NAA: NOT DETECTED

## 2019-04-26 ENCOUNTER — Ambulatory Visit (INDEPENDENT_AMBULATORY_CARE_PROVIDER_SITE_OTHER): Payer: Medicare Other | Admitting: Endocrinology

## 2019-04-26 ENCOUNTER — Encounter: Payer: Self-pay | Admitting: Endocrinology

## 2019-04-26 ENCOUNTER — Other Ambulatory Visit: Payer: Self-pay

## 2019-04-26 DIAGNOSIS — E782 Mixed hyperlipidemia: Secondary | ICD-10-CM

## 2019-04-26 DIAGNOSIS — E1165 Type 2 diabetes mellitus with hyperglycemia: Secondary | ICD-10-CM

## 2019-04-26 MED ORDER — TRULICITY 1.5 MG/0.5ML ~~LOC~~ SOAJ: SUBCUTANEOUS | 1 refills | Status: DC

## 2019-04-26 MED ORDER — GLIMEPIRIDE 4 MG PO TABS: ORAL_TABLET | ORAL | 1 refills | Status: DC

## 2019-04-26 MED ORDER — METFORMIN HCL ER 500 MG PO TB24: 500.0000 mg | ORAL_TABLET | Freq: Two times a day (BID) | ORAL | 3 refills | Status: DC

## 2019-04-26 NOTE — Progress Notes (Signed)
Patient ID: [name], female   DOB: 10-04-1945, 73 y.o.   MRN: [id number]          Reason for Appointment: Follow-up for Type 2 Diabetes  Today's office visit was provided via telemedicine using a telephone call to the patient Patient has been explained the limitations of evaluation and management by telemedicine and the availability of in person appointments.  The patient understood the limitations and agreed to proceed. Patient also understood that the telehealth visit is billable. . Location of the patient: Home . Location of the provider: Office Only the patient and myself were participating in the encounter   History of Present Illness:          Date of diagnosis of type 2 diabetes mellitus: 1997       Background history: She has been on metformin since diagnosis Also has been on Amaryl or glipizide in the past Has had only infrequent A1c was done according to the current records that are available, A1c has been persistently high except in 2014 when it was 7.2 She thinks she had a rash for Januvia and this was stopped in 2016  Recent history:     Non-insulin hypoglycemic drugs : metformin ER 0.5g, Amaryl 4 mg in the morning and 2 mg at bedtime, Trulicity 1.5 mg weekly   Current management, blood sugar patterns and problems identified:  Her A1c has gone up to 8.2, has been as low as 6.5 previously  She is now taking  Trulicity instead of Tradjenta since her last visit because of poor control  She is tolerating this well without any nausea or malaise  Metformin was reduced to 1 tablet  This was because of previous side effects with higher doses in combination with Trulicity  She has started checking blood sugars recently as instructed recently  Her blood sugars are quite inconsistent at different times of the day  Even though her PCP has increased her morning Amaryl to 4 mg she still has high readings during the day  She is not able to watch her diet and is  also regularly drinking sweet tea, regular soft drinks and apple juice.  also not motivated to do any significant amount of exercise  Side effects from medications have been: Diarrhea from metformin, rash from Januvia, candidiasis from Jardiance  Compliance with the medical regimen improving Hypoglycemia: None  documented  Glucose monitoring:  done 0-2 times a day         Glucometer:  Accu-Chek Aviva     Blood Glucose readings:    PRE-MEAL Fasting Lunch Dinner Bedtime Overall  Glucose range:  79-193  144-208  127  147-201   Mean/median:     ?   POST-MEAL PC Breakfast PC Lunch PC Dinner  Glucose range:   255   Mean/median:        Self-care: The diet that the patient has been following is: None, she periodically will eat fried food, has snacks like popcorn    Typical meal intake: Breakfast is variable She is trying to reduce regular soft drinks, sometimes will have apple juice              Dietician visit, most recent: 7/19               Exercise: not walking much  Weight history:Maximum weight 214  Wt Readings from Last 3 Encounters:  04/09/19 193 lb 2 oz (87.6 kg)  12/08/18 189 lb (85.7 kg)  12/08/18 189 lb 12.8  oz (86.1 kg)    Glycemic control:   Lab Results  Component Value Date   HGBA1C 8.2 (H) 04/09/2019   HGBA1C 7.7 (H) 12/04/2018   HGBA1C 6.5 09/07/2018   Lab Results  Component Value Date   MICROALBUR 11.8 (H) 03/09/2018   LDLCALC 121 (H) 04/09/2019   CREATININE 1.08 (H) 04/09/2019   Lab Results  Component Value Date   MICRALBCREAT 5.3 03/09/2018    No results found for: FRUCTOSAMINE    Allergies as of 04/26/2019      Reactions   Demerol [meperidine] Other (See Comments)   Lost consciousness   Bentyl [dicyclomine Hcl] Swelling   Invokana [canagliflozin] Hives   Metformin And Related Diarrhea   Protonix [pantoprazole Sodium] Swelling   Statins Other (See Comments)   myalgia   Ace Inhibitors Cough      Medication List       Accurate  as of April 26, 2019  1:53 PM. If you have any questions, ask your nurse or doctor.        acetaminophen 325 MG tablet Commonly known as: TYLENOL Take 325 mg by mouth every 6 (six) hours as needed for mild pain.   Blood Glucose System Pak Kit Use as directed to monitor FSBS 1x daily. Dx: M25.0   Bystolic 20 MG Tabs Generic drug: Nebivolol HCl TAKE 1 TABLET BY MOUTH TWICE DAILY.   CINNAMON PO Take 1 capsule by mouth. Takes sometimes   furosemide 40 MG tablet Commonly known as: LASIX Take 1 tablet (40 mg total) by mouth every other day.   glimepiride 2 MG tablet Commonly known as: AMARYL TAKE 2 TABLETS BY MOUTH EVERY MORNING AND 1 TABLET AT BEDTIME.   glucose blood test strip Commonly known as: Accu-Chek Guide 1 each by Other route as needed for other. Use as instructed to check blood sugar daily.   Lancets Super Thin 28G Misc Use to check blood sugar once daily.   losartan 25 MG tablet Commonly known as: COZAAR TAKE ONE TABLET BY MOUTH EVERY MORNING.   metFORMIN 500 MG 24 hr tablet Commonly known as: GLUCOPHAGE-XR Take 1 tablet (500 mg total) by mouth daily with breakfast.   multivitamin with minerals Tabs tablet Take 1 tablet by mouth daily.   potassium chloride 10 MEQ tablet Commonly known as: K-DUR Take 1 tablet (10 mEq total) by mouth every other day.   tetrahydrozoline-zinc 0.05-0.25 % ophthalmic solution Commonly known as: VISINE-AC Place 2 drops into both eyes 3 (three) times daily as needed (dry eyes).   Trulicity 0.37 CW/8.8QB Sopn Generic drug: Dulaglutide INJECT 1 PEN INTO THE ABDOMINAL SKIN ONCE WEEKLY AS DIRECTED.   VITAMIN D3 PO Take 1 tablet by mouth daily.       Allergies:  Allergies  Allergen Reactions  . Demerol [Meperidine] Other (See Comments)    Lost consciousness  . Bentyl [Dicyclomine Hcl] Swelling  . Invokana [Canagliflozin] Hives  . Metformin And Related Diarrhea  . Protonix [Pantoprazole Sodium] Swelling  . Statins Other  (See Comments)    myalgia  . Ace Inhibitors Cough    Past Medical History:  Diagnosis Date  . Abdominal hernia 02/26/12   unrepaired  . 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 Anemia   . Anxiety   . Blood transfusion 1980's  . Complication of anesthesia    has a hard time waking up; can't lay flat  . Degenerative disc disease, lumbar  pressing on L3 and L4  . Diabetes mellitus ([place]) 1989   Type 2 NIDDM; "cancer treatment gave me diabetes"  . DJD (degenerative joint disease) of lumbar spine   . Dysrhythmia    "irregular"  . Exertional dyspnea   . GERD (gastroesophageal reflux disease)    mann  . Heart murmur    "think I outgrew it"  . History of kidney stones   . History of stomach ulcers   . Hx: UTI (urinary tract infection)   . Hyperlipidemia   . Hypersensitivity    in tongue  . Hypertension   . IBS (irritable bowel syndrome)   . Insomnia   . Kidney cysts    RT KIDNEY  . Kidney stones 2009   s/p lithotripsy  . Obesity   . PONV (postoperative nausea and vomiting)   . Shortness of breath    unable to lie flat    Past Surgical History:  Procedure Laterality Date  . ABDOMINAL HYSTERECTOMY  2002  . APPENDECTOMY    . BACK SURGERY    . BREAST BIOPSY     right  . CATARACT EXTRACTION W/ INTRAOCULAR LENS  IMPLANT, BILATERAL  2009-2010  . CESAREAN SECTION  1973; 1978; 1981  . COLONOSCOPY WITH PROPOFOL N/A 10/29/2016   Procedure: COLONOSCOPY WITH PROPOFOL;  Surgeon: Danie Binder, MD;  [facility];  Service: Endoscopy;  Laterality: N/A;  10:00 am  . CYSTOSCOPY Left 04/13/2014   Procedure: CYSTOSCOPY FLEXIBLE;  Surgeon: Ardis Hughs, MD;  Location: [facility];  Service: Urology;  Laterality: Left;  with STENT  . ESOPHAGOGASTRODUODENOSCOPY (EGD) WITH PROPOFOL N/A 10/29/2016   Procedure: ESOPHAGOGASTRODUODENOSCOPY (EGD) WITH PROPOFOL;  Surgeon: Danie Binder, MD;  [facility];  Service: Endoscopy;  Laterality:  N/A;  . EYE SURGERY    . implant and screws put in the right lower jaw  11/2008   dental surgery  . kidney blockage  ?1990's   " major surgery ;put kidney on pump for awhile"  . LITHOTRIPSY     "several times"  . LUMBAR FUSION  08/2010  . MASTECTOMY  1997   right  . NEPHROLITHOTOMY Left 04/13/2014   Procedure: LEFT PERCUTANEOUS NEPHROLITHOTOMY WITH SURGEON ACCESS;  Surgeon: Ardis Hughs, MD;  Location: [facility];  Service: Urology;  Laterality: Left;  . OVARIAN CYST SURGERY    . PARATHYROIDECTOMY  02/26/2012   Procedure: PARATHYROIDECTOMY;  Surgeon: Ascencion Dike, MD;  Location: [facility];  Service: ENT;;  . Azzie Almas DILATION N/A 10/29/2016   Procedure: SAVORY DILATION;  Surgeon: Danie Binder, MD;  [facility];  Service: Endoscopy;  Laterality: N/A;  . [place] SURGERY  2011  . urological surgery for blocked ureter secondary to kidney stone      Family History  Problem Relation Age of Onset  . Heart attack Mother   . Cancer Brother        Esophageal; smoker; deceased 66s  . Diabetes Daughter   . Leukemia Paternal Aunt        deceased 59s  . Pancreatic cancer Paternal Aunt        deceased 37  . Cancer Paternal Aunt        GI cancer; deceased 43s  . Cancer Cousin        kidney cancer; deceased 39; pat first cousin; daughter of aunt w/ leukemia  . Cancer Cousin        colon cancer @ 76; pat first cousin; son of  aunt with GI cancer  . Anesthesia problems Neg Hx     Social History:  reports that she has never smoked. She has never used smokeless tobacco. She reports that she does not drink alcohol or use drugs.   Review of Systems   Lipid history: Not on treatment, is statin intolerant LDL has gone up    Lab Results  Component Value Date   CHOL 204 (H) 04/09/2019   CHOL 154 01/16/2018   CHOL 180 09/24/2017   Lab Results  Component Value Date   HDL 56 04/09/2019   HDL 47 (L) 01/16/2018   HDL 46 (L) 09/24/2017   Lab Results  Component Value Date   LDLCALC 121  (H) 04/09/2019   LDLCALC 88 01/16/2018   LDLCALC 98 09/24/2017   Lab Results  Component Value Date   TRIG 159 (H) 04/09/2019   TRIG 98 01/16/2018   TRIG 239 (H) 09/24/2017   Lab Results  Component Value Date   CHOLHDL 3.6 04/09/2019   CHOLHDL 3.3 01/16/2018   CHOLHDL 3.9 09/24/2017   No results found for: LDLDIRECT          Hypertension: Blood pressure treated with losartan and Bystolic by her PCP and is going to follow-up today  BP Readings from Last 3 Encounters:  04/09/19 122/78  12/08/18 130/68  12/08/18 108/60    Most recent eye exam was in 3/18  Most recent foot exam: 3/19  She has recurrent problems with abdominal discomfort and occasional diarrhea also which is longstanding  LABS:  No visits with results within 1 Week(s) from this visit.  Latest known visit with results is:  Lab on 04/16/2019  Component Date Value Ref Range Status  . SARS-CoV-2, NAA 04/16/2019 Not Detected  Not Detected Final   Comment: This test was developed and its performance characteristics determined by [employer]. This test has not been FDA cleared or approved. This test has been authorized by FDA under an Emergency Use Authorization (EUA). This test is only authorized for the duration of time the declaration that circumstances exist justifying the authorization of the emergency use of in vitro diagnostic tests for detection of SARS-CoV-2 virus and/or diagnosis of COVID-19 infection under section 564(b)(1) of the Act, 21 U.S.C. 295MWU-1(L)(2), unless the authorization is terminated or revoked sooner. When diagnostic testing is negative, the possibility of a false negative result should be considered in the context of a patient's recent exposures and the presence of clinical signs and symptoms consistent with COVID-19. An individual without symptoms of COVID-19 and who is not shedding SARS-CoV-2 virus would expect to have a negative (not detected) result in this assay.      Physical Examination:  There were no vitals taken for this visit.      ASSESSMENT:  Diabetes type 2, mild obesity  See history of present illness for detailed discussion of current diabetes management, blood sugar patterns and problems identified  Her A1c is continuing to be high at 8.2  Her diet has been poor she is still gaining weight Even with trying Trulicity 4.40 mg her control is not good and this is more from inconsistent diet because of variable blood sugar patterns However likely is insulin deficient also  Hyperlipidemia: With poor diet her LDL gone up significantly, previously below 100 However may be a candidate for Zetia since she does not want to take statin drugs   PLAN:    She needs to consistently improve her diet with reducing high-fat foods and also  eliminate regular soft drinks and sweet tea She agrees to a trial of 1.5 mg Trulicity She will increase metformin to twice daily She will walk or do indoor aerobic exercise Continue checking blood sugars regularly Same dose of Amaryl Follow-up in 2 months for A1c  There are no Patient Instructions on file for this visit.  Total visit time on the phone encounter =13 minutes    Elayne Snare 04/26/2019, 1:53 PM   Note: This office note was prepared with Dragon voice recognition system technology. Any transcriptional errors that result from this process are unintentional.

## 2019-04-30 ENCOUNTER — Ambulatory Visit (HOSPITAL_COMMUNITY): Admission: RE | Admit: 2019-04-30 | Payer: Medicare Other | Source: Ambulatory Visit

## 2019-05-06 ENCOUNTER — Other Ambulatory Visit: Payer: Self-pay | Admitting: Family Medicine

## 2019-05-14 ENCOUNTER — Encounter: Payer: Self-pay | Admitting: Family Medicine

## 2019-05-14 ENCOUNTER — Other Ambulatory Visit: Payer: Self-pay

## 2019-05-14 ENCOUNTER — Ambulatory Visit (INDEPENDENT_AMBULATORY_CARE_PROVIDER_SITE_OTHER): Payer: Medicare Other | Admitting: Family Medicine

## 2019-05-14 VITALS — BP 126/62 | HR 62 | Temp 98.9°F | Resp 14 | Ht 66.0 in | Wt 191.0 lb

## 2019-05-14 DIAGNOSIS — M79642 Pain in left hand: Secondary | ICD-10-CM

## 2019-05-14 DIAGNOSIS — M25541 Pain in joints of right hand: Secondary | ICD-10-CM

## 2019-05-14 DIAGNOSIS — M79641 Pain in right hand: Secondary | ICD-10-CM

## 2019-05-14 DIAGNOSIS — M25542 Pain in joints of left hand: Secondary | ICD-10-CM | POA: Diagnosis not present

## 2019-05-14 MED ORDER — METHYLPREDNISOLONE 4 MG PO TBPK: ORAL_TABLET | ORAL | 0 refills | Status: DC

## 2019-05-14 NOTE — Patient Instructions (Addendum)
Get the xrays done on the hands We will call with lab results  F/U 4 months

## 2019-05-14 NOTE — Progress Notes (Signed)
   Subjective:    Patient ID: [name], female    DOB: [birth year], 73 y.o.   MRN: [id number]  Patient presents for B Hand Pain (x months- constant pain- is using voltaren gel- unsure if it's RA or gout)   Bilat hand pain for past cpouple months, feels like arthritis pain and aching. She can hardly grip or grasp without pain. Has soreness at base of thumbs, some fingers feel like they get stuck and she has to pry her fingers to straighten Only in her hands, not any other joints  Denies any tingling or numbness in fingers, doesn't have to shake out her hands  Tried topical voltaren which helps a little , and CBD oil  She has been taking advil  taking tumeric tablet daily  Review Of Systems:  GEN- denies fatigue, fever, weight loss,weakness, recent illness HEENT- denies eye drainage, change in vision, nasal discharge, CVS- denies chest pain, palpitations RESP- denies SOB, cough, wheeze MSK- + joint pain, muscle aches, injury Neuro- denies headache, dizziness, syncope, seizure activity       Objective:    BP 126/62   Pulse 62   Temp 98.9 F (37.2 C) (Oral)   Resp 14   Ht 5\' 6"  (1.676 m)   Wt 191 lb (86.6 kg)   SpO2 99%   BMI 30.83 kg/m  GEN- NAD, alert and oriented x3 CVS- RRR, no murmur RESP-CTAB MSK-   Pain at PIP bilat, mild curvuture of index fingers bilat, unable to make full fist or grasp tightly, no swelling EXT- No edema Pulses- Radial 2+        Assessment & Plan:      Problem List Items Addressed This Visit    None    Visit Diagnoses    Bilateral hand pain    -  Primary   Concern for OA vs RF, given medrol dose pak as NSAIDS not helping as much, continue tumeric, xrays to be done, labs   Relevant Orders   DG Hand Complete Right   DG Hand Complete Left   Rheumatoid factor   Sedimentation Rate   C-reactive protein   Arthralgia of both hands       Relevant Orders   DG Hand Complete Right   DG Hand Complete Left   Rheumatoid factor   Sedimentation Rate   C-reactive protein      Note: This dictation was prepared with Dragon dictation along with smaller phrase technology. Any transcriptional errors that result from this process are unintentional.

## 2019-05-15 LAB — SEDIMENTATION RATE: Sed Rate: 36 mm/h — ABNORMAL HIGH (ref 0–30)

## 2019-05-15 LAB — RHEUMATOID FACTOR: Rheumatoid fact SerPl-aCnc: <14 IU/mL (ref <14)

## 2019-05-15 LAB — C-REACTIVE PROTEIN: CRP: 6.6 mg/L (ref <8.0)

## 2019-05-25 ENCOUNTER — Other Ambulatory Visit: Payer: Self-pay

## 2019-05-25 ENCOUNTER — Ambulatory Visit (HOSPITAL_COMMUNITY)
Admission: RE | Admit: 2019-05-25 | Discharge: 2019-05-25 | Disposition: A | Discharge status: Home / Self Care | Payer: Medicare Other | Source: Ambulatory Visit | Attending: Family Medicine | Admitting: Family Medicine

## 2019-05-25 DIAGNOSIS — M79641 Pain in right hand: Secondary | ICD-10-CM | POA: Insufficient documentation

## 2019-05-25 DIAGNOSIS — M25541 Pain in joints of right hand: Secondary | ICD-10-CM | POA: Diagnosis present

## 2019-05-25 DIAGNOSIS — M25542 Pain in joints of left hand: Secondary | ICD-10-CM | POA: Insufficient documentation

## 2019-05-25 DIAGNOSIS — M79642 Pain in left hand: Secondary | ICD-10-CM | POA: Insufficient documentation

## 2019-08-13 ENCOUNTER — Other Ambulatory Visit: Payer: Self-pay | Admitting: Family Medicine

## 2019-11-09 ENCOUNTER — Other Ambulatory Visit: Payer: Self-pay | Admitting: Family Medicine

## 2019-11-19 ENCOUNTER — Other Ambulatory Visit: Payer: Self-pay | Admitting: Endocrinology

## 2019-11-19 ENCOUNTER — Other Ambulatory Visit: Payer: Self-pay | Admitting: Family Medicine

## 2019-12-15 ENCOUNTER — Other Ambulatory Visit: Payer: Self-pay

## 2019-12-15 ENCOUNTER — Encounter: Payer: Self-pay | Admitting: Family Medicine

## 2019-12-15 ENCOUNTER — Ambulatory Visit (INDEPENDENT_AMBULATORY_CARE_PROVIDER_SITE_OTHER): Payer: Medicare Other | Admitting: Family Medicine

## 2019-12-15 VITALS — BP 134/64 | HR 82 | Temp 98.6°F | Resp 14 | Ht 66.5 in | Wt 191.0 lb

## 2019-12-15 DIAGNOSIS — I10 Essential (primary) hypertension: Secondary | ICD-10-CM | POA: Diagnosis not present

## 2019-12-15 DIAGNOSIS — K3184 Gastroparesis: Secondary | ICD-10-CM

## 2019-12-15 DIAGNOSIS — K219 Gastro-esophageal reflux disease without esophagitis: Secondary | ICD-10-CM

## 2019-12-15 DIAGNOSIS — E669 Obesity, unspecified: Secondary | ICD-10-CM

## 2019-12-15 DIAGNOSIS — E1165 Type 2 diabetes mellitus with hyperglycemia: Secondary | ICD-10-CM | POA: Diagnosis not present

## 2019-12-15 DIAGNOSIS — R197 Diarrhea, unspecified: Secondary | ICD-10-CM

## 2019-12-15 DIAGNOSIS — K580 Irritable bowel syndrome with diarrhea: Secondary | ICD-10-CM

## 2019-12-15 DIAGNOSIS — E1143 Type 2 diabetes mellitus with diabetic autonomic (poly)neuropathy: Secondary | ICD-10-CM

## 2019-12-15 DIAGNOSIS — K439 Ventral hernia without obstruction or gangrene: Secondary | ICD-10-CM

## 2019-12-15 MED ORDER — DIPHENOXYLATE-ATROPINE 2.5-0.025 MG PO TABS: 1.0000 | ORAL_TABLET | Freq: Two times a day (BID) | ORAL | 1 refills | Status: DC | PRN

## 2019-12-15 NOTE — Assessment & Plan Note (Signed)
Uncontrolled diabetes mellitus.  I think this is also contributing to her GI symptoms.  She not tolerate GLP-1 therapy due to the GI effects.  She will continue with her oral medications Metformin and glimepiride.  Recheck A1c and I will get this over to her endocrinologist as she is overdue for follow-up.  She is going to schedule an eye exam.

## 2019-12-15 NOTE — Assessment & Plan Note (Signed)
She is maintaining her weight through lot of liquid calories

## 2019-12-15 NOTE — Assessment & Plan Note (Signed)
IBS symptoms with chronic bloating diarrhea issues gastroparesis.  She also has reflux.  She has been tried on multiple medications.  She is having 10-15 stools a day per report.  Feels her quality of life is declining because of her GI issues.  We will send her to a new gastroenterologist in [place].  Her old GI whom she has not seen in the past few years will be leaving the health system.  I will go ahead and obtain a CT abdomen as we have not had imaging in over 5 years.  We will see if she has any chronic inflammation to suggest an inflammatory colitis that has developed.  We will go ahead and give her Lomotil to try to slow down the diarrhea.  She had reaction to Bentyl in the past.

## 2019-12-15 NOTE — Patient Instructions (Addendum)
CT scan to be done  Try the Lomotil  Stay off the trulicity  F/U 4 months

## 2019-12-15 NOTE — Assessment & Plan Note (Signed)
Blood pressure is controlled no change in medication. 

## 2019-12-15 NOTE — Progress Notes (Signed)
Subjective:    Patient ID: [name], female    DOB: Feb 22, 1946, 74 y.o.   MRN: [id number]  Patient presents for Follow-up (is fasting), GI Issues (reflux, indigestion, loose stools 10-15x daily), and Arthritis Pain (B hand pain- stiffness) Here to follow-up chronic medical problems along with multiple concerns.  Has known arthritic pain in her hands and joints.  We discussed this at our last visit in August. She had negative RF .  She is using arthritis gloves topicals and acetaminophen as needed.  Diabetes mellitus uncontrolled followed by Dr.Kumar - last viist in August  , last A1C 8.2%, in July  she came off trulcity as this caused severe abdominal pain   Metformin 500mg  BID, amaryl 4mg  BID  States that her blood sugars have been high because she is taking in mostly liquid calories.  HTN- taking bystolic and losartan   His chronic GI issues.She has known IBS, gastroparesis, if she has multiple BM a day 10 times a day, often   She has ventral hernia that keeps her stomach protruded as well  On top of the bloating  Has accidents at times Still has to strain even though she has loose stools throughout the day No change in stools with changes in diabetes medication such as stopping MTF Last scope in 2018.  Her gastroenterologist is moving next month. She is drinking most of her calories- Boost/ sweat tea/Coke /   eats lots of oranges    Cant eat meat, veggies without more abd discomfort   She is due for Eye visit    COVID 19 vaccine done        Review Of Systems:  GEN- denies fatigue, fever, weight loss,weakness, recent illness HEENT- denies eye drainage, change in vision, nasal discharge, CVS- denies chest pain, palpitations RESP- denies SOB, cough, wheeze ABD- denies N/V,+ change in stools, +abd pain GU- denies dysuria, hematuria, dribbling, incontinence MSK- denies joint pain, muscle aches, injury Neuro- denies headache, dizziness, syncope, seizure  activity       Objective:    BP 134/64   Pulse 82   Temp 98.6 F (37 C) (Temporal)   Resp 14   Ht 5\' 4"  (1.626 m)   Wt 191 lb (86.6 kg)   SpO2 98%   BMI 32.79 kg/m  GEN- NAD, alert and oriented x3 HEENT- PERRL, EOMI, non injected sclera, pink conjunctiva, MMM, oropharynx clear Neck- Supple, no thyromegaly CVS- RRR, no murmur RESP-CTAB ABD-NABS,soft,NT,ND, ventral hernia EXT- No edema Pulses- Radial, DP- 2+       Assessment & Plan:      Problem List Items Addressed This Visit      Unprioritized   Diabetic neuropathy (HCC)   Esophageal reflux   Relevant Medications   diphenoxylate-atropine (LOMOTIL) 2.5-0.025 MG tablet   Other Relevant Orders   Ambulatory referral to Gastroenterology   Essential hypertension    Blood pressure is controlled no change in medication.      Relevant Orders   CBC with Differential/Platelet   Comprehensive metabolic panel   GASTROPARESIS - Primary   Relevant Orders   CT Abdomen Pelvis W Contrast   Ambulatory referral to Gastroenterology   IBS (irritable bowel syndrome)    IBS symptoms with chronic bloating diarrhea issues gastroparesis.  She also has reflux.  She has been tried on multiple medications.  She is having 10-15 stools a day per report.  Feels her quality of life is declining because of her GI issues.  We will send  her to a new gastroenterologist in [place].  Her old GI whom she has not seen in the past few years will be leaving the health system.  I will go ahead and obtain a CT abdomen as we have not had imaging in over 5 years.  We will see if she has any chronic inflammation to suggest an inflammatory colitis that has developed.  We will go ahead and give her Lomotil to try to slow down the diarrhea.  She had reaction to Bentyl in the past.      Relevant Medications   diphenoxylate-atropine (LOMOTIL) 2.5-0.025 MG tablet   Other Relevant Orders   CT Abdomen Pelvis W Contrast   Ambulatory referral to Gastroenterology    Obesity, Class I, BMI 30-34.9    She is maintaining her weight through lot of liquid calories      Uncontrolled type 2 diabetes mellitus with hyperglycemia, without long-term current use of insulin (HCC)    Uncontrolled diabetes mellitus.  I think this is also contributing to her GI symptoms.  She not tolerate GLP-1 therapy due to the GI effects.  She will continue with her oral medications Metformin and glimepiride.  Recheck A1c and I will get this over to her endocrinologist as she is overdue for follow-up.  She is going to schedule an eye exam.      Relevant Orders   Hemoglobin A1c   Lipid panel   HM DIABETES FOOT EXAM (Completed)   Ventral hernia   Relevant Orders   CT Abdomen Pelvis W Contrast    Other Visit Diagnoses    Diarrhea, unspecified type       Relevant Orders   CT Abdomen Pelvis W Contrast      Note: This dictation was prepared with Dragon dictation along with smaller phrase technology. Any transcriptional errors that result from this process are unintentional.

## 2019-12-16 LAB — LIPID PANEL
Cholesterol: 194 mg/dL (ref <200)
HDL: 57 mg/dL (ref >=50)
LDL Cholesterol (Calc): 110 mg/dL (calc) — ABNORMAL HIGH
Non-HDL Cholesterol (Calc): 137 mg/dL (calc) — ABNORMAL HIGH (ref <130)
Total CHOL/HDL Ratio: 3.4 (calc) (ref <5.0)
Triglycerides: 152 mg/dL — ABNORMAL HIGH (ref <150)

## 2019-12-16 LAB — CBC WITH DIFFERENTIAL/PLATELET
Absolute Monocytes: 686 cells/uL (ref 200–950)
Basophils Absolute: 66 cells/uL (ref 0–200)
Basophils Relative: 0.7 %
Eosinophils Absolute: 113 cells/uL (ref 15–500)
Eosinophils Relative: 1.2 %
HCT: 34.9 % — ABNORMAL LOW (ref 35.0–45.0)
Hemoglobin: 11.5 g/dL — ABNORMAL LOW (ref 11.7–15.5)
Lymphs Abs: 2801 cells/uL (ref 850–3900)
MCH: 26 pg — ABNORMAL LOW (ref 27.0–33.0)
MCHC: 33 g/dL (ref 32.0–36.0)
MCV: 79 fL — ABNORMAL LOW (ref 80.0–100.0)
MPV: 11.1 fL (ref 7.5–12.5)
Monocytes Relative: 7.3 %
Neutro Abs: 5734 cells/uL (ref 1500–7800)
Neutrophils Relative %: 61 %
Platelets: 236 10*3/uL (ref 140–400)
RBC: 4.42 10*6/uL (ref 3.80–5.10)
RDW: 14.5 % (ref 11.0–15.0)
Total Lymphocyte: 29.8 %
WBC: 9.4 10*3/uL (ref 3.8–10.8)

## 2019-12-16 LAB — COMPREHENSIVE METABOLIC PANEL
AG Ratio: 1.3 (calc) (ref 1.0–2.5)
ALT: 20 U/L (ref 6–29)
AST: 22 U/L (ref 10–35)
Albumin: 4 g/dL (ref 3.6–5.1)
Alkaline phosphatase (APISO): 42 U/L (ref 37–153)
BUN/Creatinine Ratio: 7 (calc) (ref 6–22)
BUN: 7 mg/dL (ref 7–25)
CO2: 26 mmol/L (ref 20–32)
Calcium: 9.1 mg/dL (ref 8.6–10.4)
Chloride: 104 mmol/L (ref 98–110)
Creat: 1.07 mg/dL — ABNORMAL HIGH (ref 0.60–0.93)
Globulin: 3 g/dL (calc) (ref 1.9–3.7)
Glucose, Bld: 212 mg/dL — ABNORMAL HIGH (ref 65–99)
Potassium: 3.3 mmol/L — ABNORMAL LOW (ref 3.5–5.3)
Sodium: 141 mmol/L (ref 135–146)
Total Bilirubin: 0.4 mg/dL (ref 0.2–1.2)
Total Protein: 7 g/dL (ref 6.1–8.1)

## 2019-12-16 LAB — HEMOGLOBIN A1C
Hgb A1c MFr Bld: 8.5 % of total Hgb — ABNORMAL HIGH (ref <5.7)
Mean Plasma Glucose: 197 (calc)
eAG (mmol/L): 10.9 (calc)

## 2019-12-17 ENCOUNTER — Other Ambulatory Visit: Payer: Self-pay | Admitting: *Deleted

## 2019-12-17 DIAGNOSIS — E876 Hypokalemia: Secondary | ICD-10-CM

## 2019-12-20 ENCOUNTER — Telehealth: Payer: Self-pay | Admitting: *Deleted

## 2019-12-20 NOTE — Telephone Encounter (Signed)
Received call from patient daughter, Jonelle Sidle.   Reports that patient has had reaction to Lomotil. Reports that patient had increased abdominal pain, and has not had BM since trying the medication.   Reports that medication was stopped.   Call placed to patient to inquire. [place].

## 2019-12-20 NOTE — Telephone Encounter (Signed)
Received return call from patient.   Reports that she began Lomotil on 12/15/2019. States that she took it BID until 12/19/2019. Reports that she did have increased abdominal pain. Reports that pain was severe. States that she was not able to have BM at all over the weekend.   Reports that she did not take the PM dose on 12/19/2019, nor has she taken any today.   Advised that medication should only be taken as needed, but advised to hold medication at this time.   Reports that she is urinating normally, but she has not had any flatulence at this time either. Reports that she is drinking Pedialyte. Advised to limit Pedialyte as it is very high in sugar content.   States that abdominal discomfort is much improved, but has not completely resolved.   Advised if abdominal pain worsens, or if she is unable to pass gas, go to ER for evaluation.

## 2019-12-21 ENCOUNTER — Other Ambulatory Visit: Payer: Self-pay | Admitting: Family Medicine

## 2019-12-29 ENCOUNTER — Encounter: Payer: Self-pay | Admitting: Internal Medicine

## 2019-12-30 ENCOUNTER — Ambulatory Visit (HOSPITAL_COMMUNITY)
Admission: RE | Admit: 2019-12-30 | Discharge: 2019-12-30 | Disposition: A | Discharge status: Home / Self Care | Payer: Medicare Other | Source: Ambulatory Visit | Attending: Family Medicine | Admitting: Family Medicine

## 2019-12-30 ENCOUNTER — Other Ambulatory Visit: Payer: Self-pay

## 2019-12-30 DIAGNOSIS — K3184 Gastroparesis: Secondary | ICD-10-CM | POA: Diagnosis present

## 2019-12-30 DIAGNOSIS — K439 Ventral hernia without obstruction or gangrene: Secondary | ICD-10-CM

## 2019-12-30 DIAGNOSIS — R197 Diarrhea, unspecified: Secondary | ICD-10-CM

## 2019-12-30 DIAGNOSIS — K580 Irritable bowel syndrome with diarrhea: Secondary | ICD-10-CM

## 2019-12-30 MED ORDER — IOHEXOL 300 MG/ML  SOLN
100.0000 mL | Freq: Once | INTRAMUSCULAR | Status: AC | PRN
  Administered 2019-12-30: 100 mL via INTRAVENOUS

## 2020-01-03 ENCOUNTER — Encounter: Payer: Self-pay | Admitting: *Deleted

## 2020-01-05 ENCOUNTER — Encounter: Payer: Self-pay | Admitting: Internal Medicine

## 2020-01-05 ENCOUNTER — Other Ambulatory Visit: Payer: Self-pay

## 2020-01-05 ENCOUNTER — Ambulatory Visit: Payer: Medicare Other | Admitting: Internal Medicine

## 2020-01-05 ENCOUNTER — Ambulatory Visit (INDEPENDENT_AMBULATORY_CARE_PROVIDER_SITE_OTHER)
Admission: RE | Admit: 2020-01-05 | Discharge: 2020-01-05 | Disposition: A | Discharge status: Home / Self Care | Payer: Medicare Other | Source: Ambulatory Visit | Attending: Internal Medicine | Admitting: Internal Medicine

## 2020-01-05 ENCOUNTER — Other Ambulatory Visit (INDEPENDENT_AMBULATORY_CARE_PROVIDER_SITE_OTHER): Payer: Medicare Other

## 2020-01-05 VITALS — BP 134/76 | HR 78 | Temp 97.2°F | Ht 66.5 in | Wt 189.0 lb

## 2020-01-05 DIAGNOSIS — K529 Noninfective gastroenteritis and colitis, unspecified: Secondary | ICD-10-CM

## 2020-01-05 DIAGNOSIS — R109 Unspecified abdominal pain: Secondary | ICD-10-CM

## 2020-01-05 DIAGNOSIS — K219 Gastro-esophageal reflux disease without esophagitis: Secondary | ICD-10-CM

## 2020-01-05 DIAGNOSIS — R140 Abdominal distension (gaseous): Secondary | ICD-10-CM | POA: Diagnosis not present

## 2020-01-05 DIAGNOSIS — D509 Iron deficiency anemia, unspecified: Secondary | ICD-10-CM

## 2020-01-05 LAB — FERRITIN: Ferritin: 48.2 ng/mL (ref 10.0–291.0)

## 2020-01-05 LAB — TSH: TSH: 0.57 u[IU]/mL (ref 0.35–4.50)

## 2020-01-05 LAB — HIGH SENSITIVITY CRP: CRP, High Sensitivity: 4.06 mg/L (ref 0.000–5.000)

## 2020-01-05 LAB — IGA: IgA: 171 mg/dL (ref 68–378)

## 2020-01-05 MED ORDER — FAMOTIDINE 20 MG PO TABS: 20.0000 mg | ORAL_TABLET | Freq: Two times a day (BID) | ORAL | 2 refills | Status: DC

## 2020-01-05 NOTE — Progress Notes (Signed)
Patient ID: [name], female   DOB: [birth year], 74 y.o.   MRN: [id number] HPI: [name] is a 74 year old female who is seen to evaluate chronic loose stools/diarrhea and abdominal pain.  She is here alone today.  She has a history of GERD, IBS with chronic diarrhea, diabetes, hypertension, breast cancer, kidney stones.  She was previously evaluated by Dr. Oneida Alar and had her last colonoscopy on 10/29/2016.  This colonoscopy showed diverticulosis in the sigmoid, descending and hepatic flexure.  Moderate internal hemorrhoids.  There was moderate looping in the rectosigmoid colon.  She had a recent CT scan of the abdomen pelvis with contrast on 12/30/2019.  This showed no acute findings.  Hepatic steatosis.  3 mm left lower lung pulmonary nodule.  Nonobstructing stones in the left kidney.  The bowel was not dilated or inflamed.  There was significant peritoneal fat which was felt to possibly account for chronic bloating.  There were diverticula in the left colon not inflamed.  She reports that she has no quality of life because she has frequent loose and runny bowel movements.  She will have 10 or more per day always after eating.  She eats a very limited diet because if she eats heavy food she gets diarrhea.  Stools are urgent and can be hard and difficult to control.  She reports living on "pretzels and fries and coke".  She has not lost weight.  She has tried fiber supplement in the past which caused more constipation.  She has abdominal cramping that dates back more than 30 years.  The symptoms seem to be worse in the last several months to a year.  She uses Imodium on occasion in the morning.  She tried Lomotil but this "shot down my system" and she could not go to the bathroom.  She felt that she became dehydrated and her daughter gave her Pedialyte.  She has not used this medication again.  She does have issues with heartburn and burning epigastric discomfort.  She previously had a medication  reaction to PPI.  She reports having had 3 prior C-sections, hysterectomy for uterine fibroids.  She was told that she had damage to her intestines during the surgery.  Family history is notable for a brother with esophagus cancer and a brother with colon cancer.  Past Medical History:  Diagnosis Date  . Abdominal hernia 02/26/12   unrepaired  . 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 Anemia   . Anxiety   . Aortic atherosclerosis ([place])   . Arthritis   . Blood transfusion 1980's  . Breast cancer ([place])   . Complication of anesthesia    has a hard time waking up; can't lay flat  . Degenerative disc disease, lumbar    pressing on L3 and L4  . Diabetes mellitus ([place]) 1989   Type 2 NIDDM; "cancer treatment gave me diabetes"  . Diverticulosis   . DJD (degenerative joint disease) of lumbar spine   . Dysrhythmia    "irregular"  . Exertional dyspnea   . Gastroparesis   . GERD (gastroesophageal reflux disease)    mann  . Heart murmur    "think I outgrew it"  . Hepatic steatosis   . History of kidney stones   . History of stomach ulcers   . Hx: UTI (urinary tract infection)   . Hyperlipidemia   . Hypersensitivity    in tongue  . Hypertension   .  IBS (irritable bowel syndrome)   . IBS (irritable bowel syndrome)   . Insomnia   . Internal hemorrhoids   . Kidney cysts    RT KIDNEY  . Kidney stones 2009   s/p lithotripsy  . Obesity   . PONV (postoperative nausea and vomiting)   . Pulmonary nodule   . Shortness of breath    unable to lie flat  . Ventral hernia     Past Surgical History:  Procedure Laterality Date  . ABDOMINAL HYSTERECTOMY  2002  . APPENDECTOMY    . BACK SURGERY     Can't take any medical procedure in right arm  . BREAST BIOPSY     right  . CATARACT EXTRACTION W/ INTRAOCULAR LENS  IMPLANT, BILATERAL  2009-2010  . CESAREAN SECTION  1973; 1978; 1981  . COLONOSCOPY WITH PROPOFOL N/A 10/29/2016   Procedure: COLONOSCOPY  WITH PROPOFOL;  Surgeon: Danie Binder, MD;  [facility];  Service: Endoscopy;  Laterality: N/A;  10:00 am  . CYSTOSCOPY Left 04/13/2014   Procedure: CYSTOSCOPY FLEXIBLE;  Surgeon: Ardis Hughs, MD;  Location: [facility];  Service: Urology;  Laterality: Left;  with STENT  . ESOPHAGOGASTRODUODENOSCOPY (EGD) WITH PROPOFOL N/A 10/29/2016   Procedure: ESOPHAGOGASTRODUODENOSCOPY (EGD) WITH PROPOFOL;  Surgeon: Danie Binder, MD;  [facility];  Service: Endoscopy;  Laterality: N/A;  . EYE SURGERY    . implant and screws put in the right lower jaw  11/2008   dental surgery  . kidney blockage  ?1990's   " major surgery ;put kidney on pump for awhile"  . LITHOTRIPSY     "several times"  . LUMBAR FUSION  08/2010  . MASTECTOMY  1997   right  . NEPHROLITHOTOMY Left 04/13/2014   Procedure: LEFT PERCUTANEOUS NEPHROLITHOTOMY WITH SURGEON ACCESS;  Surgeon: Ardis Hughs, MD;  Location: [facility];  Service: Urology;  Laterality: Left;  . OVARIAN CYST SURGERY    . PARATHYROIDECTOMY  02/26/2012   Procedure: PARATHYROIDECTOMY;  Surgeon: Ascencion Dike, MD;  Location: [facility];  Service: ENT;;  . Azzie Almas DILATION N/A 10/29/2016   Procedure: SAVORY DILATION;  Surgeon: Danie Binder, MD;  Location: [facility];  Service: Endoscopy;  Laterality: N/A;  . [place] SURGERY  2011  . urological surgery for blocked ureter secondary to kidney stone      Outpatient Medications Prior to Visit  Medication Sig Dispense Refill  . acetaminophen (TYLENOL) 325 MG tablet Take 325 mg by mouth every 6 (six) hours as needed for mild pain.     . Blood Glucose Monitoring Suppl (BLOOD GLUCOSE SYSTEM PAK) KIT Use as directed to monitor FSBS 1x daily. Dx: E11.9 1 each 1  . BYSTOLIC 20 MG TABS TAKE 1 TABLET BY MOUTH TWICE DAILY. 60 tablet 0  . Cholecalciferol (VITAMIN D3 PO) Take 1 tablet by mouth daily.    Marland Kitchen CINNAMON PO Take 1 capsule by mouth. Takes sometimes    . furosemide (LASIX) 40 MG tablet Take 1 tablet (40 mg  total) by mouth every other day. (Patient taking differently: Take 40 mg by mouth as needed. ) 45 tablet 3  . glimepiride (AMARYL) 4 MG tablet TAKE 1 TABLET BY MOUTH EVERY MORNING AND 1/2 TABLET AT BEDTIME. (Patient taking differently: Take 4 mg by mouth 2 (two) times daily. TAKE 1 TABLET BY MOUTH EVERY MORNING AND 1/2 TABLET AT BEDTIME.) 135 tablet 0  . glucose blood (ACCU-CHEK GUIDE) test strip 1 each by Other route as  needed for other. Use as instructed to check blood sugar daily. 100 each 3  . LANCETS SUPER THIN 28G MISC Use to check blood sugar once daily. 100 each 3  . losartan (COZAAR) 25 MG tablet TAKE ONE TABLET BY MOUTH EVERY MORNING. 90 tablet 0  . metFORMIN (GLUCOPHAGE-XR) 500 MG 24 hr tablet Take 1 tablet (500 mg total) by mouth 2 (two) times daily. 180 tablet 3  . Multiple Vitamin (MULTIVITAMIN) tablet Take 1 tablet by mouth daily.    . potassium chloride (K-DUR) 10 MEQ tablet Take 1 tablet (10 mEq total) by mouth every other day. (Patient taking differently: Take 10 mEq by mouth every other day. ) 45 tablet 3  . tetrahydrozoline-zinc (VISINE-AC) 0.05-0.25 % ophthalmic solution Place 2 drops into both eyes 3 (three) times daily as needed (dry eyes).    . diphenoxylate-atropine (LOMOTIL) 2.5-0.025 MG tablet Take 1 tablet by mouth 2 (two) times daily as needed for diarrhea or loose stools. (Patient not taking: Reported on 01/05/2020) 30 tablet 1  . methylPREDNISolone (MEDROL DOSEPAK) 4 MG TBPK tablet Take as directed on package 21 tablet 0  . Multiple Vitamin (MULITIVITAMIN WITH MINERALS) TABS Take 1 tablet by mouth daily.     No facility-administered medications prior to visit.    Allergies  Allergen Reactions  . Demerol [Meperidine] Other (See Comments)    Lost consciousness  . Bentyl [Dicyclomine Hcl] Swelling  . Invokana [Canagliflozin] Hives  . Metformin And Related Diarrhea  . Protonix [Pantoprazole Sodium] Swelling  . Statins Other (See Comments)    myalgia  . Ace  Inhibitors Cough    Family History  Problem Relation Age of Onset  . Heart attack Mother   . Cancer Brother        Esophageal; smoker; deceased 67s  . Colon cancer Brother   . Diabetes Daughter   . Leukemia Paternal Aunt        deceased 72s  . Pancreatic cancer Paternal Aunt        deceased 2  . Cancer Paternal Aunt        GI cancer; deceased 61s  . Cancer Cousin        kidney cancer; deceased 14; pat first cousin; daughter of aunt w/ leukemia  . Cancer Cousin        colon cancer @ 30; pat first cousin; son of aunt with GI cancer  . Anesthesia problems Neg Hx     Social History   Tobacco Use  . Smoking status: Never Smoker  . Smokeless tobacco: Never Used  Substance Use Topics  . Alcohol use: Yes    Alcohol/week: 0.0 standard drinks    Comment: X2 DRINKS PER YEAR  . Drug use: No    ROS: As per history of present illness, otherwise negative  BP 134/76   Pulse 78   Temp (!) 97.2 F (36.2 C)   Ht 5' 6.5" (1.689 m)   Wt 189 lb (85.7 kg)   BMI 30.05 kg/m  Constitutional: Well-developed and well-nourished. No distress. HEENT: Normocephalic and atraumatic. Conjunctivae are normal.  No scleral icterus. Neck: Neck supple. Trachea midline. Cardiovascular: Normal rate, regular rhythm and intact distal pulses. No M/R/G Pulmonary/chest: Effort normal and breath sounds normal. No wheezing, rales or rhonchi. Abdominal: Soft, obese, protuberant but very soft abdomen, nontender.  Diastases recti.  bowel sounds active throughout. Extremities: no clubbing, cyanosis, or edema Neurological: Alert and oriented to person place and time. Skin: Skin is warm and dry.  Psychiatric: Normal  mood and affect. Behavior is normal.  RELEVANT LABS AND IMAGING: CBC    Component Value Date/Time   WBC 9.4 12/15/2019 1041   RBC 4.42 12/15/2019 1041   HGB 11.5 (L) 12/15/2019 1041   HCT 34.9 (L) 12/15/2019 1041   PLT 236 12/15/2019 1041   MCV 79.0 (L) 12/15/2019 1041   MCH 26.0 (L)  12/15/2019 1041   MCHC 33.0 12/15/2019 1041   RDW 14.5 12/15/2019 1041   LYMPHSABS 2,801 12/15/2019 1041   MONOABS 0.5 11/20/2018 1229   EOSABS 113 12/15/2019 1041   BASOSABS 66 12/15/2019 1041    CMP     Component Value Date/Time   NA 141 12/15/2019 1041   NA 142 05/11/2018 1020   K 3.3 (L) 12/15/2019 1041   CL 104 12/15/2019 1041   CO2 26 12/15/2019 1041   GLUCOSE 212 (H) 12/15/2019 1041   GLUCOSE 219 (H) 08/26/2016 1251   BUN 7 12/15/2019 1041   BUN 11 05/11/2018 1020   CREATININE 1.07 (H) 12/15/2019 1041   CALCIUM 9.1 12/15/2019 1041   CALCIUM 9.9 02/26/2012 1551   PROT 7.0 12/15/2019 1041   ALBUMIN 4.0 12/04/2018 1009   AST 22 12/15/2019 1041   ALT 20 12/15/2019 1041   ALKPHOS 45 12/04/2018 1009   BILITOT 0.4 12/15/2019 1041   GFRNONAA >60 11/20/2018 1229   GFRNONAA 68 09/07/2013 1104   GFRAA >60 11/20/2018 1229   GFRAA 78 09/07/2013 1104    ASSESSMENT/PLAN: 73 year old female who is seen to evaluate chronic loose stools/diarrhea and abdominal pain.   1.  Chronic diarrhea with abdominal bloating and pain --her colonoscopy 3 years ago was normal with the exception of diverticulosis.  Her left lower colon was noted to be redundant and so I wonder if she could have an element of overflow loose stools.  If this is the case she may benefit with slow addition of fiber.  She did not have biopsies at her colonoscopy to exclude microscopic colitis. --Stool studies to include fecal elastase, fecal leukocytes and ova and parasite exam --2 view abdominal x-ray today to evaluate bowel gas pattern and exclude constipation --Check a celiac panel, CRP, check TSH --If stool studies are negative then I would recommend we repeat colonoscopy to specifically biopsy for microscopic colitis.  2.  GERD -heartburn symptoms and previous intolerance to PPI.  Trial of famotidine 20 mg twice daily  3.  Mild anemia with microcytosis --check ferritin today    DX:IPJASN, [name], [name],  [name]

## 2020-01-05 NOTE — Patient Instructions (Signed)
Your provider has requested that you go to the basement level for lab work before leaving today. Press "B" on the elevator. The lab is located at the first door on the left as you exit the elevator.  Your provider has requested that you have an abdominal x ray before leaving today. Please go to the basement floor to our Radiology department for the test.  We have sent the following medications to your pharmacy for you to pick up at your convenience: Famotidine 20 mg twice daily   If you are age 73 or older, your body mass index should be between 23-30. Your Body mass index is 30.05 kg/m. If this is out of the aforementioned range listed, please consider follow up with your Primary Care Provider.  If you are age 73 or younger, your body mass index should be between 19-25. Your Body mass index is 30.05 kg/m. If this is out of the aformentioned range listed, please consider follow up with your Primary Care Provider.   Due to recent changes in healthcare laws, you may see the results of your imaging and laboratory studies on MyChart before your provider has had a chance to review them.  We understand that in some cases there may be results that are confusing or concerning to you. Not all laboratory results come back in the same time frame and the provider may be waiting for multiple results in order to interpret others.  Please give [place] 48 hours in order for your provider to thoroughly review all the results before contacting the office for clarification of your results.

## 2020-01-06 LAB — TISSUE TRANSGLUTAMINASE, IGA: (tTG) Ab, IgA: 1 U/mL

## 2020-01-07 ENCOUNTER — Other Ambulatory Visit: Payer: Medicare Other

## 2020-01-07 DIAGNOSIS — K529 Noninfective gastroenteritis and colitis, unspecified: Secondary | ICD-10-CM

## 2020-01-07 DIAGNOSIS — K219 Gastro-esophageal reflux disease without esophagitis: Secondary | ICD-10-CM

## 2020-01-07 DIAGNOSIS — R109 Unspecified abdominal pain: Secondary | ICD-10-CM

## 2020-01-17 ENCOUNTER — Telehealth: Payer: Self-pay | Admitting: *Deleted

## 2020-01-17 DIAGNOSIS — K529 Noninfective gastroenteritis and colitis, unspecified: Secondary | ICD-10-CM

## 2020-01-17 LAB — FECAL LACTOFERRIN, QUANT
Fecal Lactoferrin: NEGATIVE
MICRO NUMBER:: 10372936
SPECIMEN QUALITY:: ADEQUATE

## 2020-01-17 LAB — OVA AND PARASITE EXAMINATION
CONCENTRATE RESULT:: NONE SEEN
MICRO NUMBER:: 10372949
SPECIMEN QUALITY:: ADEQUATE
TRICHROME RESULT:: NONE SEEN

## 2020-01-17 NOTE — Telephone Encounter (Signed)
-----   Message from Jerene Bears, MD sent at 01/17/2020  4:54 PM EDT ----- Yes please, for completeness Thanks JMP ----- Message ----- From: Larina Bras, CMA Sent: 01/17/2020   4:51 PM EDT To: Jerene Bears, MD  Looks like either she didn't turn enough stool back in or container wasn't given to her. So no, test not sent. Im glad to have her come do it if needed.

## 2020-01-17 NOTE — Telephone Encounter (Signed)
I have spoken to patient to advise that currently, all stool testing has come back negative or normal. Advised we would like [name] to come for pancreatic fecal elactase but if negative will likely need colonoscopy for further evaluation of diarrhea. She verbalizes understanding.

## 2020-01-20 ENCOUNTER — Other Ambulatory Visit: Payer: Self-pay | Admitting: Family Medicine

## 2020-01-24 ENCOUNTER — Other Ambulatory Visit: Payer: Self-pay

## 2020-01-26 ENCOUNTER — Other Ambulatory Visit: Payer: Self-pay | Admitting: Endocrinology

## 2020-01-26 ENCOUNTER — Other Ambulatory Visit: Payer: Medicare Other

## 2020-01-26 ENCOUNTER — Ambulatory Visit: Payer: Medicare Other | Admitting: Endocrinology

## 2020-01-26 ENCOUNTER — Encounter: Payer: Self-pay | Admitting: Endocrinology

## 2020-01-26 ENCOUNTER — Other Ambulatory Visit: Payer: Self-pay

## 2020-01-26 VITALS — BP 136/70 | HR 80 | Ht 66.5 in | Wt 188.4 lb

## 2020-01-26 DIAGNOSIS — E782 Mixed hyperlipidemia: Secondary | ICD-10-CM | POA: Diagnosis not present

## 2020-01-26 DIAGNOSIS — E1165 Type 2 diabetes mellitus with hyperglycemia: Secondary | ICD-10-CM

## 2020-01-26 DIAGNOSIS — I10 Essential (primary) hypertension: Secondary | ICD-10-CM

## 2020-01-26 DIAGNOSIS — K529 Noninfective gastroenteritis and colitis, unspecified: Secondary | ICD-10-CM

## 2020-01-26 LAB — GLUCOSE, POCT (MANUAL RESULT ENTRY): POC Glucose: 207 mg/dL — AB (ref 70–99)

## 2020-01-26 MED ORDER — FARXIGA 5 MG PO TABS: 5.0000 mg | ORAL_TABLET | Freq: Every day | ORAL | 3 refills | Status: DC

## 2020-01-26 NOTE — Patient Instructions (Addendum)
Check blood sugars on waking up days a week  Also check blood sugars about 2 hours after meals and do this after different meals by rotation  Recommended blood sugar levels on waking up are 90-130 and about 2 hours after meal is 130-160  Please bring your blood sugar monitor to each visit, thank you  Stop metformin  Farxiga in am, drink more fluids  Stop Losartan

## 2020-01-26 NOTE — Progress Notes (Signed)
Patient ID: [name], female   DOB: 06-29-1946, 74 y.o.   MRN: [id number]          Reason for Appointment: Follow-up for Type 2 Diabetes   History of Present Illness:          Date of diagnosis of type 2 diabetes mellitus: 1997       Background history: She has been on metformin since diagnosis Also has been on Amaryl or glipizide in the past Has had only infrequent A1c was done according to the current records that are available, A1c has been persistently high except in 2014 when it was 7.2 She thinks she had a rash for Januvia and this was stopped in 2016  Recent history:     Non-insulin hypoglycemic drugs : metformin ER 0.5 bid, Amaryl 4 mg in the morning and 4 mg at bedtime   Current management, blood sugar patterns and problems identified:   Her A1c has gone up to 8.5 as of 11/2019 compared to 8.2, has been as low as 6.5 previously    She is not taking Trulicity for at least a few months as she had an abdominal discomfort with it.  Previously had done well with 0.75 mg  Was given a trial of the 1.5 mg which she did not tolerate but did not let us know she was having a problem  She is supposed to be on Metformin once a day only and she is taking this twice a day  However recently having persistent diarrhea  Currently also taking Amaryl 1 tablet twice a day up to 4 mg, previously only 2 mg in the evening  As before she is not checking her blood sugars lately, previously they were mostly high   She is again drinking sweet tea, regular soft drinks and now Pedialyte because of her diarrhea and decreased appetite  However has not lost weight  Not doing any walking, she is feels weak   lab glucose today random is 207  Side effects from medications have been: Diarrhea from metformin, rash from Januvia, candidiasis from Jardiance  Compliance with the medical regimen improving Hypoglycemia: None  documented  Glucose monitoring:  done 0-2 times a day          Glucometer:  Accu-Chek [employer]iva     Blood Glucose readings: By recall 155-180, after meals 200 +  Previous readings:  PRE-MEAL Fasting Lunch Dinner Bedtime Overall  Glucose range:  79-193  144-208  127  147-201   Mean/median:     ?   POST-MEAL PC Breakfast PC Lunch PC Dinner  Glucose range:   255   Mean/median:        Self-care: The diet that the patient has been following is: None, she periodically will eat fried food, has snacks like popcorn    Typical meal intake: Breakfast is variable She is trying to reduce regular soft drinks, sometimes will have apple juice              Dietician visit, most recent: 7/19               Exercise: not walking much  Weight history:Maximum weight 214  Wt Readings from Last 3 Encounters:  01/26/20 188 lb 6.4 oz (85.5 kg)  01/05/20 189 lb (85.7 kg)  12/15/19 191 lb (86.6 kg)    Glycemic control:   Lab Results  Component Value Date   HGBA1C 8.5 (H) 12/15/2019   HGBA1C 8.2 (H) 04/09/2019   HGBA1C 7.7 (  H) 12/04/2018   Lab Results  Component Value Date   MICROALBUR 11.8 (H) 03/09/2018   LDLCALC 110 (H) 12/15/2019   CREATININE 1.07 (H) 12/15/2019   Lab Results  Component Value Date   MICRALBCREAT 5.3 03/09/2018    No results found for: FRUCTOSAMINE    Allergies as of 01/26/2020      Reactions   Demerol [meperidine] Other (See Comments)   Lost consciousness   Bentyl [dicyclomine Hcl] Swelling   Invokana [canagliflozin] Hives   Metformin And Related Diarrhea   Protonix [pantoprazole Sodium] Swelling   Statins Other (See Comments)   myalgia   Ace Inhibitors Cough      Medication List       Accurate as of Jan 26, 2020 10:22 AM. If you have any questions, ask your nurse or doctor.        STOP taking these medications   diphenoxylate-atropine 2.5-0.025 MG tablet Commonly known as: Lomotil Stopped by: Elayne Snare, MD     TAKE these medications   acetaminophen 325 MG tablet Commonly known as: TYLENOL Take 325 mg by  mouth every 6 (six) hours as needed for mild pain.   Blood Glucose System Pak Kit Use as directed to monitor FSBS 1x daily. Dx: Q33.0   Bystolic 20 MG Tabs Generic drug: Nebivolol HCl TAKE 1 TABLET BY MOUTH TWICE DAILY.   CINNAMON PO Take 1 capsule by mouth. Takes sometimes   famotidine 20 MG tablet Commonly known as: PEPCID Take 1 tablet (20 mg total) by mouth 2 (two) times daily.   furosemide 40 MG tablet Commonly known as: LASIX Take 1 tablet (40 mg total) by mouth every other day. What changed:   when to take this  reasons to take this   glimepiride 4 MG tablet Commonly known as: AMARYL TAKE 1 TABLET BY MOUTH EVERY MORNING AND 1/2 TABLET AT BEDTIME. What changed:   how much to take  how to take this  when to take this  additional instructions   glucose blood test strip Commonly known as: Accu-Chek Guide 1 each by Other route as needed for other. Use as instructed to check blood sugar daily.   Lancets Super Thin 28G Misc Use to check blood sugar once daily.   losartan 25 MG tablet Commonly known as: COZAAR TAKE ONE TABLET BY MOUTH EVERY MORNING.   metFORMIN 500 MG 24 hr tablet Commonly known as: GLUCOPHAGE-XR Take 1 tablet (500 mg total) by mouth 2 (two) times daily.   multivitamin tablet Take 1 tablet by mouth daily.   potassium chloride 10 MEQ tablet Commonly known as: KLOR-CON Take 1 tablet (10 mEq total) by mouth every other day.   tetrahydrozoline-zinc 0.05-0.25 % ophthalmic solution Commonly known as: VISINE-AC Place 2 drops into both eyes 3 (three) times daily as needed (dry eyes).   VITAMIN D3 PO Take 1 tablet by mouth daily.       Allergies:  Allergies  Allergen Reactions  . Demerol [Meperidine] Other (See Comments)    Lost consciousness  . Bentyl [Dicyclomine Hcl] Swelling  . Invokana [Canagliflozin] Hives  . Metformin And Related Diarrhea  . Protonix [Pantoprazole Sodium] Swelling  . Statins Other (See Comments)    myalgia    . Ace Inhibitors Cough    Past Medical History:  Diagnosis Date  . Abdominal hernia 02/26/12   unrepaired  . 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 Anemia   . Anxiety   .  Aortic atherosclerosis ([place])   . Arthritis   . Blood transfusion 1980's  . Breast cancer ([place])   . Complication of anesthesia    has a hard time waking up; can't lay flat  . Degenerative disc disease, lumbar    pressing on L3 and L4  . Diabetes mellitus ([place]) 1989   Type 2 NIDDM; "cancer treatment gave me diabetes"  . Diverticulosis   . DJD (degenerative joint disease) of lumbar spine   . Dysrhythmia    "irregular"  . Exertional dyspnea   . Gastroparesis   . GERD (gastroesophageal reflux disease)    mann  . Heart murmur    "think I outgrew it"  . Hepatic steatosis   . History of kidney stones   . History of stomach ulcers   . Hx: UTI (urinary tract infection)   . Hyperlipidemia   . Hypersensitivity    in tongue  . Hypertension   . IBS (irritable bowel syndrome)   . IBS (irritable bowel syndrome)   . Insomnia   . Internal hemorrhoids   . Kidney cysts    RT KIDNEY  . Kidney stones 2009   s/p lithotripsy  . Obesity   . PONV (postoperative nausea and vomiting)   . Pulmonary nodule   . Shortness of breath    unable to lie flat  . Ventral hernia     Past Surgical History:  Procedure Laterality Date  . ABDOMINAL HYSTERECTOMY  2002  . APPENDECTOMY    . BACK SURGERY     Can't take any medical procedure in right arm  . BREAST BIOPSY     right  . CATARACT EXTRACTION W/ INTRAOCULAR LENS  IMPLANT, BILATERAL  2009-2010  . CESAREAN SECTION  1973; 1978; 1981  . COLONOSCOPY WITH PROPOFOL N/A 10/29/2016   Procedure: COLONOSCOPY WITH PROPOFOL;  Surgeon: Danie Binder, MD;  [facility];  Service: Endoscopy;  Laterality: N/A;  10:00 am  . CYSTOSCOPY Left 04/13/2014   Procedure: CYSTOSCOPY FLEXIBLE;  Surgeon: Ardis Hughs, MD;  Location: [facility];   Service: Urology;  Laterality: Left;  with STENT  . ESOPHAGOGASTRODUODENOSCOPY (EGD) WITH PROPOFOL N/A 10/29/2016   Procedure: ESOPHAGOGASTRODUODENOSCOPY (EGD) WITH PROPOFOL;  Surgeon: Danie Binder, MD;  [facility];  Service: Endoscopy;  Laterality: N/A;  . EYE SURGERY    . implant and screws put in the right lower jaw  11/2008   dental surgery  . kidney blockage  ?1990's   " major surgery ;put kidney on pump for awhile"  . LITHOTRIPSY     "several times"  . LUMBAR FUSION  08/2010  . MASTECTOMY  1997   right  . NEPHROLITHOTOMY Left 04/13/2014   Procedure: LEFT PERCUTANEOUS NEPHROLITHOTOMY WITH SURGEON ACCESS;  Surgeon: Ardis Hughs, MD;  Location: [facility];  Service: Urology;  Laterality: Left;  . OVARIAN CYST SURGERY    . PARATHYROIDECTOMY  02/26/2012   Procedure: PARATHYROIDECTOMY;  Surgeon: Ascencion Dike, MD;  Location: [facility] [place];  Service: ENT;;  . Azzie Almas DILATION N/A 10/29/2016   Procedure: SAVORY DILATION;  Surgeon: Danie Binder, MD;  [facility];  Service: Endoscopy;  Laterality: N/A;  . [facility] SURGERY  2011  . urological surgery for blocked ureter secondary to kidney stone      Family History  Problem Relation Age of Onset  . Heart attack Mother   . Cancer Brother        Esophageal; smoker; deceased 59s  . Colon  cancer Brother   . Diabetes Daughter   . Leukemia Paternal Aunt        deceased 34s  . Pancreatic cancer Paternal Aunt        deceased 41  . Cancer Paternal Aunt        GI cancer; deceased 21s  . Cancer Cousin        kidney cancer; deceased 60; pat first cousin; daughter of aunt w/ leukemia  . Cancer Cousin        colon cancer @ 88; pat first cousin; son of aunt with GI cancer  . Anesthesia problems Neg Hx     Social History:  reports that she has never smoked. She has never used smokeless tobacco. She reports current alcohol use. She reports that she does not use drugs.   Review of Systems   Lipid history: Not on treatment, is statin  intolerant LDL has been variable     Lab Results  Component Value Date   CHOL 194 12/15/2019   CHOL 204 (H) 04/09/2019   CHOL 154 01/16/2018   Lab Results  Component Value Date   HDL 57 12/15/2019   HDL 56 04/09/2019   HDL 47 (L) 01/16/2018   Lab Results  Component Value Date   LDLCALC 110 (H) 12/15/2019   LDLCALC 121 (H) 04/09/2019   LDLCALC 88 01/16/2018   Lab Results  Component Value Date   TRIG 152 (H) 12/15/2019   TRIG 159 (H) 04/09/2019   TRIG 98 01/16/2018   Lab Results  Component Value Date   CHOLHDL 3.4 12/15/2019   CHOLHDL 3.6 04/09/2019   CHOLHDL 3.3 01/16/2018   No results found for: LDLDIRECT          Hypertension: Blood pressure treated with losartan 25 and Bystolic prescribed by her PCP Has a meter at home but does not check readings  BP Readings from Last 3 Encounters:  01/26/20 136/70  01/05/20 134/76  12/15/19 134/64    Most recent eye exam was in 3/18  Most recent foot exam: 3/19  She continues to be evaluated for diarrhea   LABS:  Office Visit on 01/26/2020  Component Date Value Ref Range Status  . POC Glucose 01/26/2020 207* 70 - 99 mg/dl Final    Physical Examination:  BP 136/70 (BP Location: Left Arm, Patient Position: Sitting, Cuff Size: Normal)   Pulse 80   Ht 5' 6.5" (1.689 m)   Wt 188 lb 6.4 oz (85.5 kg)   BMI 29.95 kg/m       ASSESSMENT:  Diabetes type 2, mild obesity  See history of present illness for detailed discussion of current diabetes management, blood sugar patterns and problems identified  Her A1c is continuing to be high at 8.5  She had tried Trulicity previously but has not taken it for a few months because of reportedly having abdominal pain although had done fairly well with 0.75 mg weekly She is otherwise still having continued GI problems likely unrelated to Trulicity However may have some exacerbation of diarrhea with continuing Metformin which she has increased on her own  She is very  regular with checking her sugars Currently not exercising  Hyperlipidemia: Not on a statin  LDL gone up over 100  Consider using Zetia since she does not want to take statin drugs  Mild hypertension: Well-controlled  PLAN:    She is a good candidate for an SGLT2 drug Although she may have had an allergic reaction to Invokana previously she can be  given a trial of a different SGLT2 drug Discussed action of SGLT 2 drugs on lowering glucose by decreasing kidney absorption of glucose, benefits of weight loss and lower blood pressure, possible side effects including candidiasis and dosage regimen  With starting Farxiga 5 mg daily she will stop losartan for now We will need to recheck her renal function today as baseline Recommended checking blood pressure at home also  Discussed needing to cut back on high sugar drinks and she can have diet drinks or make unsweetened tea She can start walking when she is feeling better overall Stop Metformin for now Needs more regular follow-up and will see her back in a month to reassess We will continue Amaryl unchanged but consider reducing the dose if blood sugars are low normal  There are no Patient Instructions on file for this visit.      Elayne Snare 01/26/2020, 10:22 AM   Note: This office note was prepared with Dragon voice recognition system technology. Any transcriptional errors that result from this process are unintentional.

## 2020-02-02 LAB — PANCREATIC ELASTASE, FECAL: Pancreatic Elastase-1, Stool: 230 mcg/g

## 2020-02-04 IMAGING — US US THYROID
1 series · 13 of 25 positions shown · non-contrast
Comparison: Prior thyroid ultrasound 07/11/2016 and 01/25/2015

CLINICAL DATA: Goiter. 71-year-old female with a history of thyroid
nodules

EXAM:
THYROID ULTRASOUND
TECHNIQUE: Ultrasound examination of the thyroid gland and adjacent soft
tissues was performed.

[Series 1: us thyroid · 0.06mm/px · 13 of 78 slices shown]
[im 1/78]
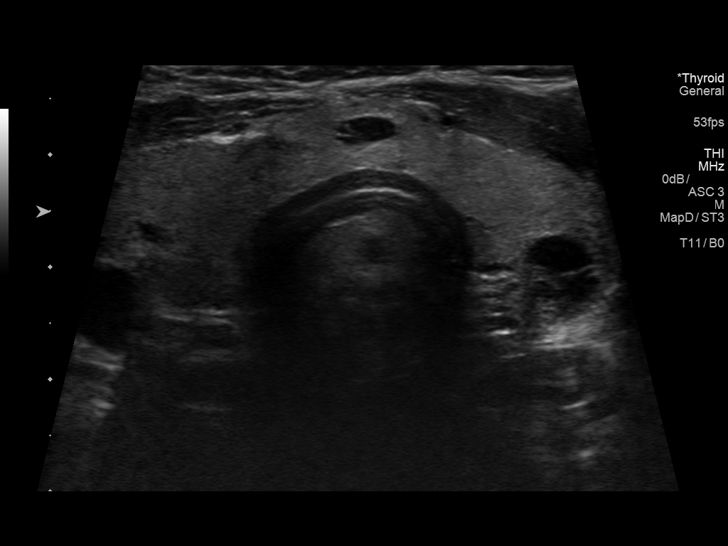
[im 7/78]
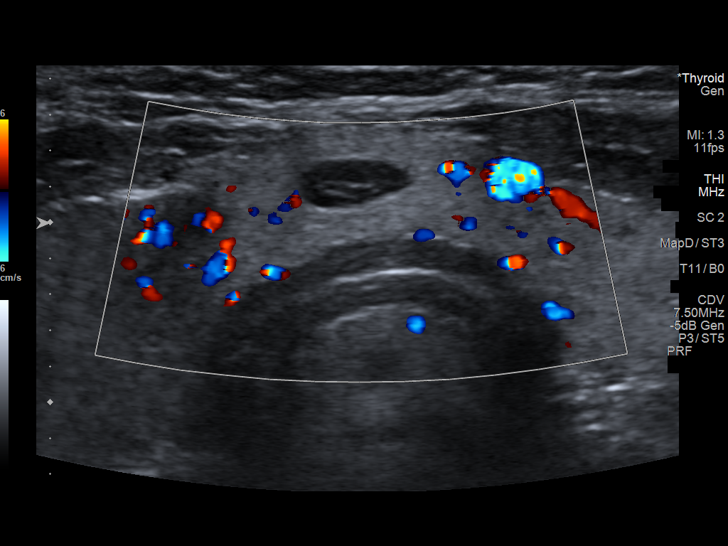
[im 13/78]
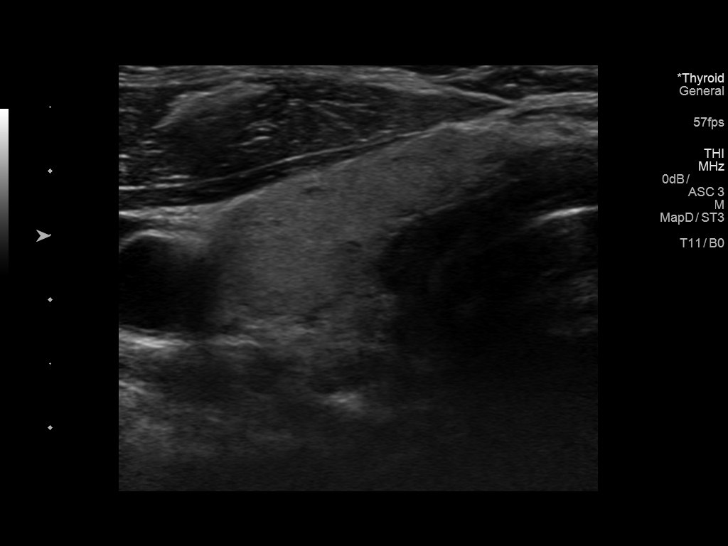
[im 20/78]
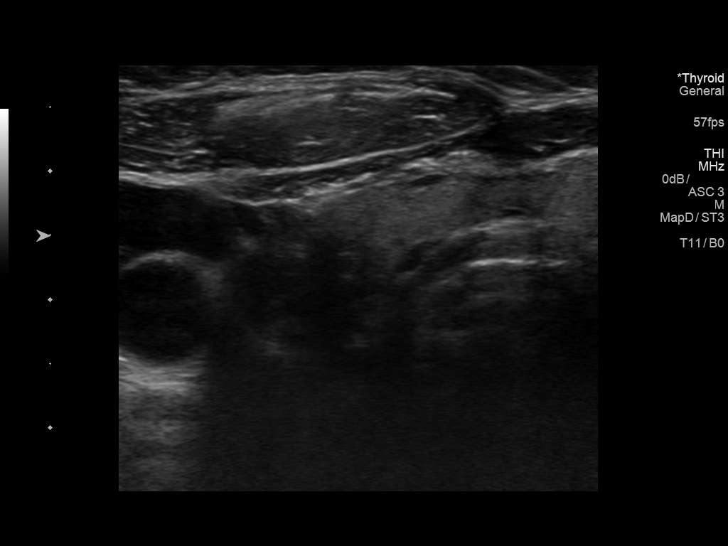
[im 26/78]
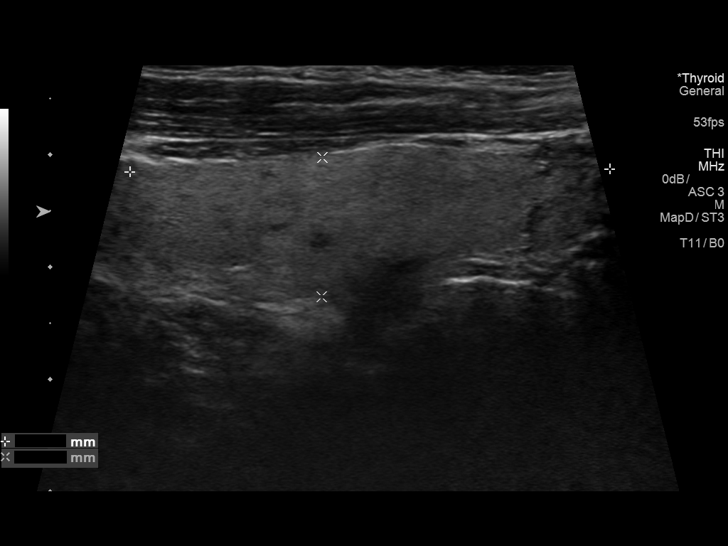
[im 33/78]
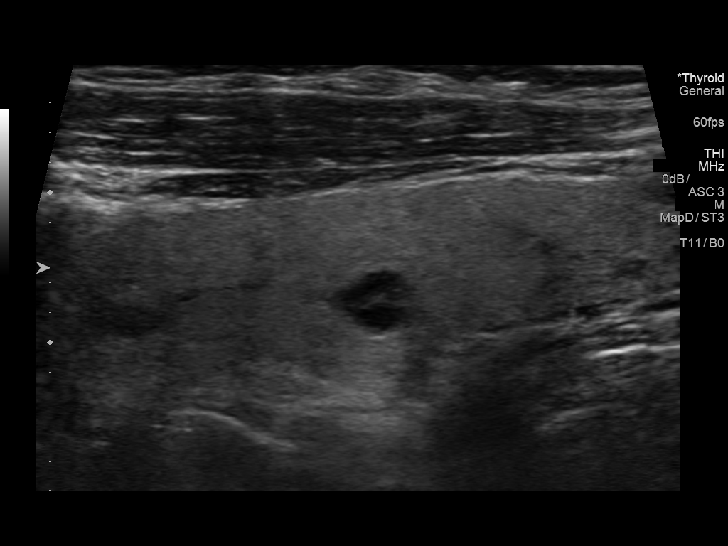
[im 39/78]
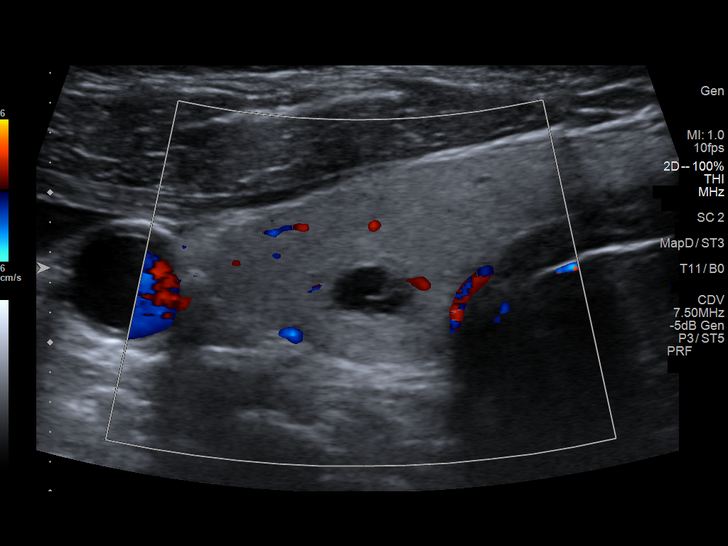
[im 45/78]
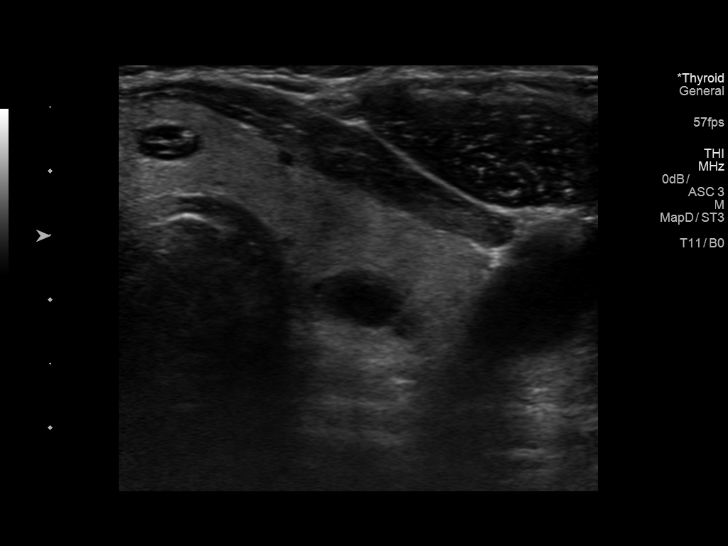
[im 52/78]
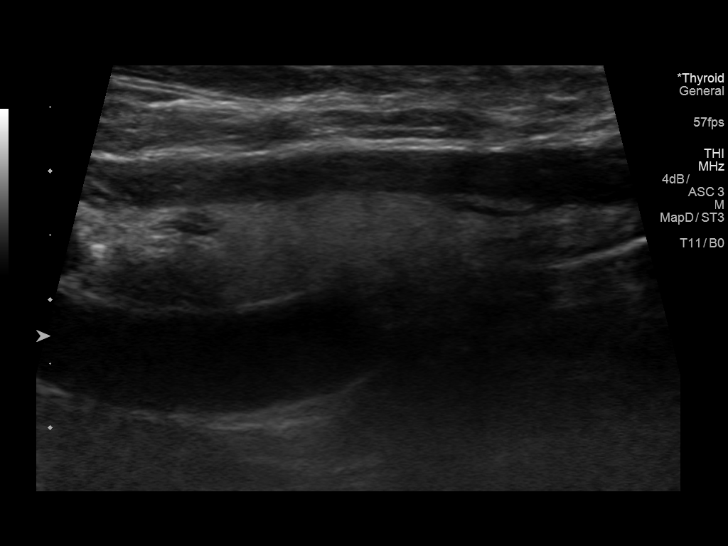
[im 58/78]
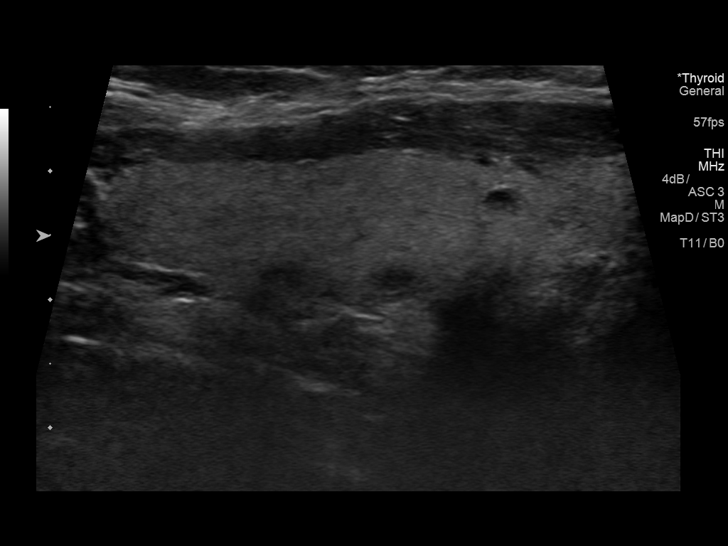
[im 65/78]
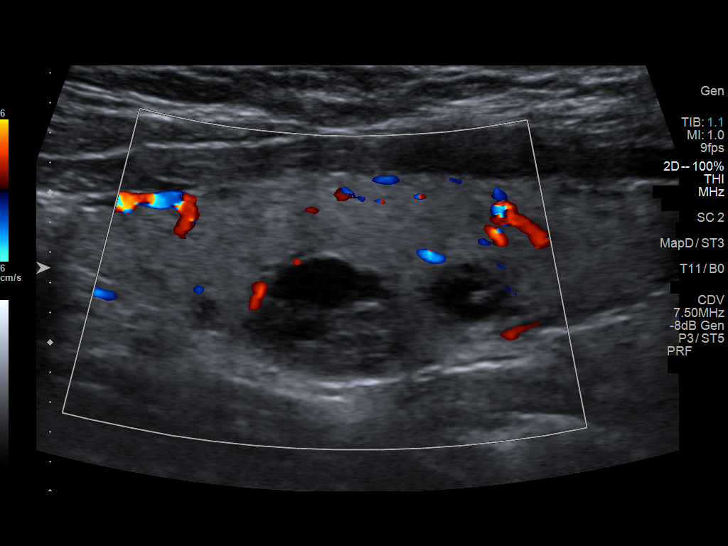
[im 71/78]
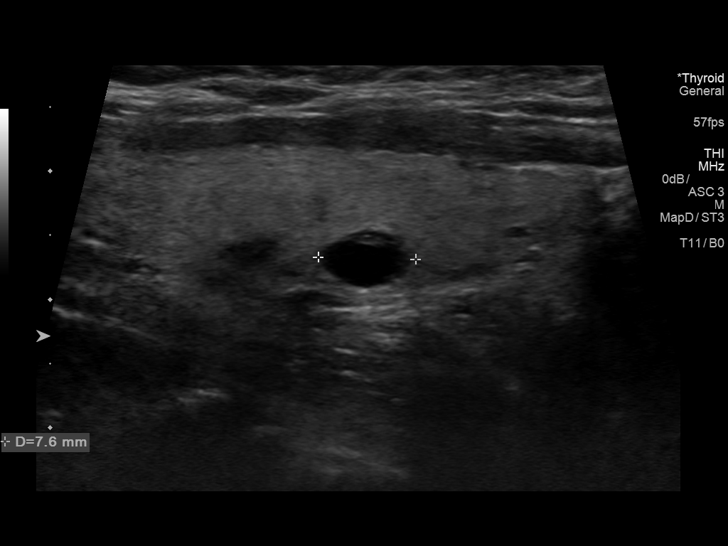
[im 78/78]
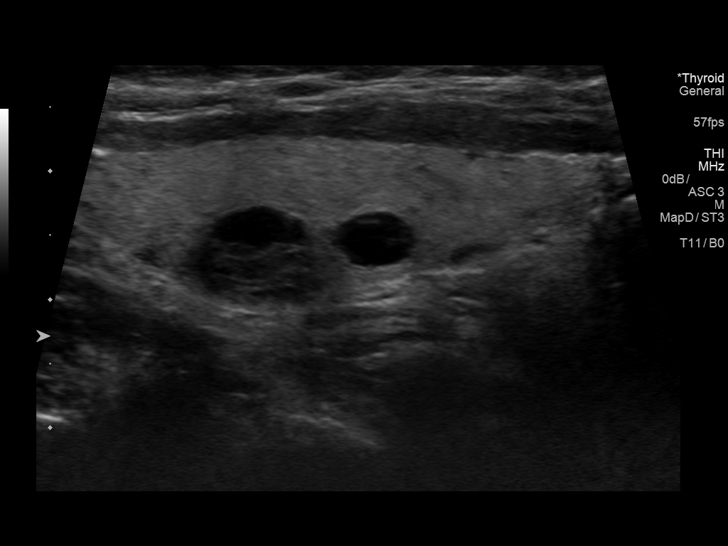

[13 of 25 positions shown; findings below may reference images not displayed]

FINDINGS: Parenchymal Echotexture: Mildly heterogenous

Isthmus: 0.6 cm

Right lobe: 4.3 x 1.7 x 1.2 cm

Left lobe: 4.4 x 1.6 x 1.5 cm

_________________________________________________________

Estimated total number of nodules >/= 1 cm: 1

Number of spongiform nodules >/=  2 cm not described below (TR1): 0

Number of mixed cystic and solid nodules >/= 1.5 cm not described
below (TR2): 0

_________________________________________________________

Nodule # 3:

Prior biopsy: No

Location: Left; Mid

Maximum size: 1.1 cm; Other 2 dimensions: 0.8 x 0.7 cm, previously,
1.2 x 0.8 x 0.8 cm

Composition: mixed cystic and solid (1)

Echogenicity: hypoechoic (2)

Shape: not taller-than-wide (0)

Margins: smooth (0)

Echogenic foci: none (0)

ACR TI-RADS total points: 3.

ACR TI-RADS risk category:  TR3 (3 points).

Significant change in size (>/= 20% in two dimensions and minimal
increase of 2 mm): No

Change in features: No

Change in ACR TI-RADS risk category: No

ACR TI-RADS recommendations:

Given size (<1.4 cm) and appearance, this nodule does NOT meet
TI-RADS criteria for biopsy or dedicated follow-up.

_________________________________________________________

Additional tiny bilateral thyroid cysts and nodules are also noted.
None of these meet criteria for further evaluation.
IMPRESSION: Continued stability of small bilateral thyroid nodules. These
nodules do not meet criteria for further evaluation or biopsy based
on ACR TI-RADS criteria. No further follow-up required.

The above is in keeping with the ACR TI-RADS recommendations - [HOSPITAL] 6059;[DATE].

## 2020-02-07 ENCOUNTER — Other Ambulatory Visit: Payer: Self-pay

## 2020-02-07 ENCOUNTER — Ambulatory Visit (HOSPITAL_COMMUNITY)
Admission: RE | Admit: 2020-02-07 | Discharge: 2020-02-07 | Disposition: A | Discharge status: Home / Self Care | Payer: Medicare Other | Source: Ambulatory Visit | Attending: Nurse Practitioner | Admitting: Nurse Practitioner

## 2020-02-07 DIAGNOSIS — Z1231 Encounter for screening mammogram for malignant neoplasm of breast: Secondary | ICD-10-CM | POA: Insufficient documentation

## 2020-02-10 ENCOUNTER — Other Ambulatory Visit: Payer: Self-pay

## 2020-02-10 ENCOUNTER — Encounter (HOSPITAL_COMMUNITY): Payer: Self-pay | Admitting: Nurse Practitioner

## 2020-02-10 ENCOUNTER — Inpatient Hospital Stay (HOSPITAL_COMMUNITY): Payer: Medicare Other | Attending: Nurse Practitioner | Admitting: Nurse Practitioner

## 2020-02-10 VITALS — BP 117/74 | HR 84 | Temp 96.8°F | Resp 20 | Wt 187.0 lb

## 2020-02-10 DIAGNOSIS — M8580 Other specified disorders of bone density and structure, unspecified site: Secondary | ICD-10-CM

## 2020-02-10 DIAGNOSIS — Z853 Personal history of malignant neoplasm of breast: Secondary | ICD-10-CM | POA: Insufficient documentation

## 2020-02-10 DIAGNOSIS — Z1239 Encounter for other screening for malignant neoplasm of breast: Secondary | ICD-10-CM

## 2020-02-10 DIAGNOSIS — C50919 Malignant neoplasm of unspecified site of unspecified female breast: Secondary | ICD-10-CM

## 2020-02-10 DIAGNOSIS — Z9221 Personal history of antineoplastic chemotherapy: Secondary | ICD-10-CM | POA: Diagnosis not present

## 2020-02-10 DIAGNOSIS — Z170 Estrogen receptor positive status [ER+]: Secondary | ICD-10-CM | POA: Diagnosis not present

## 2020-02-10 NOTE — Assessment & Plan Note (Addendum)
1.  Stage Ia (T1cN0M0) right breast cancer, ER positive/PR positive: -Patient was diagnosed with right breast cancer in 1997. -Right breast cancer measuring 1.1 x 1.2 cm, grade 2, status post right mastectomy with regional axillary lymph node dissection on 10/17/1995; all 8 axillary lymph nodes were negative for metastatic disease. -She had adjuvant chemotherapy consisting of Adriamycin/Cytoxan x4 cycles. -Antiestrogen therapy with tamoxifen x5 years was completed and 03/2001. -Patient was not seen at the clinic last year due to Covid. -Patient may be discharged from clinic however she would like for [place] to follow her labs and yearly mammograms. -Last mammogram done on 02/07/2020 showed B RADS category 1 negative. -We will see her back in 1 year with repeat labs mammogram and DEXA scan.

## 2020-02-10 NOTE — Patient Instructions (Signed)
[facility]  Discharge Instructions:  You saw Randi Lockamy, NP, today. _______________________________________________________________  Thank you for choosing [facility] at [facility] to provide your oncology and hematology care.  To afford each patient quality time with our providers, please arrive at least 15 minutes before your scheduled appointment.  You need to re-schedule your appointment if you arrive 10 or more minutes late.  We strive to give you quality time with our providers, and arriving late affects you and other patients whose appointments are after yours.  Also, if you no show three or more times for appointments you may be dismissed from the clinic.  Again, thank you for choosing [facility]. Our hope is that these requests will allow you access to exceptional care and in a timely manner. _______________________________________________________________  If you have questions after your visit, please contact our office at [phone] between the hours of 8:30 a.m. and 5:00 p.m. Voicemails left after 4:30 p.m. will not be returned until the following business day. _______________________________________________________________  For prescription refill requests, have your pharmacy contact our office. _______________________________________________________________  Recommendations made by the consultant and any test results will be sent to your referring physician. _______________________________________________________________ 

## 2020-02-10 NOTE — Progress Notes (Signed)
[name], [place] [id number]   CLINIC:  Medical Oncology/Hematology  PCP:  Alycia Rossetti, MD [address] [place] [address] [phone]   REASON FOR VISIT: Follow-up for Stage IA (T1cN0M0) right breast cancer, ER+/PR+ (diagnosed in 1997)   CURRENT THERAPY: Observation  BRIEF ONCOLOGIC HISTORY:  Oncology History  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 ([place])  02/09/2008 Initial Diagnosis   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 ([place])   07/06/2014 Genetic Testing   Testing was normal and did not reveal a mutation in these genes. The genes tested were ATM, BARD1, BRCA1, BRCA2, BRIP1, CDH1, CHEK2, MRE11A, MUTYH, NBN, NF1, PALB2, PTEN, RAD50, RAD51C, RAD51D, and TP53.     CANCER STAGING: Cancer Staging 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 [facility]) Staging form: Breast, AJCC 6th Edition - Clinical: Stage I (T1c, N0, M0) - Signed by Baird Cancer, PA-C on 05/21/2013    INTERVAL HISTORY:  [name] 74 y.o. female returns for routine follow-up for breast cancer.  Patient reports she is doing well since her last visit.  She reports she still wants to be followed by [place] yearly to look at her mammograms and labs.  She denies any new lumps present.  She denies any new bone pain. Denies any nausea, vomiting, or constipation. Denies any new pains. Had not noticed any recent bleeding such as epistaxis, hematuria or hematochezia. Denies recent chest pain on exertion, shortness of breath on minimal exertion, pre-syncopal episodes, or palpitations. Denies any numbness or tingling in hands or feet. Denies any recent fevers, infections, or recent hospitalizations. Patient reports appetite at 75% and energy level at 25%.  She is eating well maintain her weight at this time.     REVIEW OF SYSTEMS:  Review of Systems  Respiratory: Positive for cough.   Gastrointestinal: Positive for diarrhea.  Neurological: Positive for dizziness and numbness.    All other systems reviewed and are negative.    PAST MEDICAL/SURGICAL HISTORY:  Past Medical History:  Diagnosis Date  . Abdominal hernia 02/26/12   unrepaired  . Adenocarcinoma of breast ([place]) 1997   right / chemo + tamoxifen x 5 years   . Anemia   . Anxiety   . Aortic atherosclerosis ([place])   . Arthritis   . Blood transfusion 1980's  . Breast cancer ([place])   . Complication of anesthesia    has a hard time waking up; can't lay flat  . Degenerative disc disease, lumbar    pressing on L3 and L4  . Diabetes mellitus ([place]) 1989   Type 2 NIDDM; "cancer treatment gave me diabetes"  . Diverticulosis   . DJD (degenerative joint disease) of lumbar spine   . Dysrhythmia    "irregular"  . Exertional dyspnea   . Gastroparesis   . GERD (gastroesophageal reflux disease)    mann  . Heart murmur    "think I outgrew it"  . Hepatic steatosis   . History of kidney stones   . History of stomach ulcers   . Hx: UTI (urinary tract infection)   . Hyperlipidemia   . Hypersensitivity    in tongue  . Hypertension   . IBS (irritable bowel syndrome)   . IBS (irritable bowel syndrome)   . Insomnia   . Internal hemorrhoids   . Kidney cysts    RT KIDNEY  . Kidney stones 2009   s/p lithotripsy  . Obesity   . PONV (postoperative nausea and vomiting)   .  Pulmonary nodule   . Shortness of breath    unable to lie flat  . Ventral hernia    Past Surgical History:  Procedure Laterality Date  . ABDOMINAL HYSTERECTOMY  2002  . APPENDECTOMY    . BACK SURGERY     Can't take any medical procedure in right arm  . BREAST BIOPSY     right  . CATARACT EXTRACTION W/ INTRAOCULAR LENS  IMPLANT, BILATERAL  2009-2010  . CESAREAN SECTION  1973; 1978; 1981  . COLONOSCOPY WITH PROPOFOL N/A 10/29/2016   Procedure: COLONOSCOPY WITH PROPOFOL;  Surgeon: Danie Binder, MD;  [facility];  Service: Endoscopy;  Laterality: N/A;  10:00 am  . CYSTOSCOPY Left 04/13/2014   Procedure: CYSTOSCOPY FLEXIBLE;   Surgeon: Ardis Hughs, MD;  Location: [facility];  Service: Urology;  Laterality: Left;  with STENT  . ESOPHAGOGASTRODUODENOSCOPY (EGD) WITH PROPOFOL N/A 10/29/2016   Procedure: ESOPHAGOGASTRODUODENOSCOPY (EGD) WITH PROPOFOL;  Surgeon: Danie Binder, MD;  [facility];  Service: Endoscopy;  Laterality: N/A;  . EYE SURGERY    . implant and screws put in the right lower jaw  11/2008   dental surgery  . kidney blockage  ?1990's   " major surgery ;put kidney on pump for awhile"  . LITHOTRIPSY     "several times"  . LUMBAR FUSION  08/2010  . MASTECTOMY  1997   right  . NEPHROLITHOTOMY Left 04/13/2014   Procedure: LEFT PERCUTANEOUS NEPHROLITHOTOMY WITH SURGEON ACCESS;  Surgeon: Ardis Hughs, MD;  Location: [facility];  Service: Urology;  Laterality: Left;  . OVARIAN CYST SURGERY    . PARATHYROIDECTOMY  02/26/2012   Procedure: PARATHYROIDECTOMY;  Surgeon: Ascencion Dike, MD;  Location: [facility];  Service: ENT;;  . Azzie Almas DILATION N/A 10/29/2016   Procedure: SAVORY DILATION;  Surgeon: Danie Binder, MD;  [facility];  Service: Endoscopy;  Laterality: N/A;  . [place] SURGERY  2011  . urological surgery for blocked ureter secondary to kidney stone       SOCIAL HISTORY:  Social History   Socioeconomic History  . Marital status: Married    Spouse name: Not on file  . Number of children: 3  . Years of education: Not on file  . Highest education level: Not on file  Occupational History  . Occupation: retired in 2011  Tobacco Use  . Smoking status: Never Smoker  . Smokeless tobacco: Never Used  Substance and Sexual Activity  . Alcohol use: Yes    Alcohol/week: 0.0 standard drinks    Comment: X2 DRINKS PER YEAR  . Drug use: No  . Sexual activity: Not Currently    Birth control/protection: Surgical  Other Topics Concern  . Not on file  Social History Narrative  . Not on file   Social Determinants of Health   Financial Resource Strain:   . Difficulty of Paying Living  Expenses:   Food Insecurity:   . Worried About Charity fundraiser in the Last Year:   . Arboriculturist in the Last Year:   Transportation Needs:   . Film/video editor (Medical):   Marland Kitchen Lack of Transportation (Non-Medical):   Physical Activity:   . Days of Exercise per Week:   . Minutes of Exercise per Session:   Stress:   . Feeling of Stress :   Social Connections:   . Frequency of Communication with Friends and Family:   . Frequency of Social Gatherings with Friends  and Family:   . Attends Religious Services:   . Active Member of Clubs or Organizations:   . Attends Archivist Meetings:   Marland Kitchen Marital Status:   Intimate Partner Violence:   . Fear of Current or Ex-Partner:   . Emotionally Abused:   Marland Kitchen Physically Abused:   . Sexually Abused:     FAMILY HISTORY:  Family History  Problem Relation Age of Onset  . Heart attack Mother   . Cancer Brother        Esophageal; smoker; deceased 21s  . Colon cancer Brother   . Diabetes Daughter   . Leukemia Paternal Aunt        deceased 43s  . Pancreatic cancer Paternal Aunt        deceased 67  . Cancer Paternal Aunt        GI cancer; deceased 43s  . Cancer Cousin        kidney cancer; deceased 66; pat first cousin; daughter of aunt w/ leukemia  . Cancer Cousin        colon cancer @ 41; pat first cousin; son of aunt with GI cancer  . Anesthesia problems Neg Hx     CURRENT MEDICATIONS:  Outpatient Encounter Medications as of 02/10/2020  Medication Sig  . acetaminophen (TYLENOL) 325 MG tablet Take 325 mg by mouth every 6 (six) hours as needed for mild pain.   . Blood Glucose Monitoring Suppl (BLOOD GLUCOSE SYSTEM PAK) KIT Use as directed to monitor FSBS 1x daily. Dx: V49.4  . BYSTOLIC 20 MG TABS TAKE 1 TABLET BY MOUTH TWICE DAILY.  Marland Kitchen Cholecalciferol (VITAMIN D3 PO) Take 1 tablet by mouth daily.  Marland Kitchen CINNAMON PO Take 1 capsule by mouth. Takes sometimes  . dapagliflozin propanediol (FARXIGA) 5 MG TABS tablet Take 5 mg by  mouth daily.  . famotidine (PEPCID) 20 MG tablet Take 1 tablet (20 mg total) by mouth 2 (two) times daily.  . furosemide (LASIX) 40 MG tablet Take 1 tablet (40 mg total) by mouth every other day. (Patient taking differently: Take 40 mg by mouth as needed. )  . glimepiride (AMARYL) 4 MG tablet Take 1 tablet (4 mg total) by mouth 2 (two) times daily. TAKE 1 TABLET BY MOUTH TWICE DAILY.  Marland Kitchen glucose blood (ACCU-CHEK GUIDE) test strip 1 each by Other route as needed for other. Use as instructed to check blood sugar daily.  Marland Kitchen LANCETS SUPER THIN 28G MISC Use to check blood sugar once daily.  Marland Kitchen losartan (COZAAR) 25 MG tablet TAKE ONE TABLET BY MOUTH EVERY MORNING.  . Multiple Vitamin (MULTIVITAMIN) tablet Take 1 tablet by mouth daily.  . potassium chloride (K-DUR) 10 MEQ tablet Take 1 tablet (10 mEq total) by mouth every other day. (Patient taking differently: Take 10 mEq by mouth every other day. )  . tetrahydrozoline-zinc (VISINE-AC) 0.05-0.25 % ophthalmic solution Place 2 drops into both eyes 3 (three) times daily as needed (dry eyes).  . [DISCONTINUED] metFORMIN (GLUCOPHAGE-XR) 500 MG 24 hr tablet Take 1 tablet (500 mg total) by mouth 2 (two) times daily. (Patient not taking: Reported on 02/10/2020)   No facility-administered encounter medications on file as of 02/10/2020.    ALLERGIES:  Allergies  Allergen Reactions  . Demerol [Meperidine] Other (See Comments)    Lost consciousness  . Bentyl [Dicyclomine Hcl] Swelling  . Invokana [Canagliflozin] Hives  . Metformin And Related Diarrhea  . Protonix [Pantoprazole Sodium] Swelling  . Statins Other (See Comments)    myalgia  .  Ace Inhibitors Cough     PHYSICAL EXAM:  ECOG Performance status: 1  Vitals:   02/10/20 1254  BP: 117/74  Pulse: 84  Resp: 20  Temp: (!) 96.8 F (36 C)  SpO2: 100%   Filed Weights   02/10/20 1254  Weight: 187 lb (84.8 kg)   Physical Exam Constitutional:      Appearance: Normal appearance. She is normal  weight.  Cardiovascular:     Rate and Rhythm: Normal rate and regular rhythm.     Heart sounds: Normal heart sounds.  Pulmonary:     Effort: Pulmonary effort is normal.     Breath sounds: Normal breath sounds.  Abdominal:     General: Bowel sounds are normal.     Palpations: Abdomen is soft.  Musculoskeletal:        General: Normal range of motion.  Skin:    General: Skin is warm.  Neurological:     Mental Status: She is alert and oriented to person, place, and time. Mental status is at baseline.  Psychiatric:        Mood and Affect: Mood normal.        Behavior: Behavior normal.        Thought Content: Thought content normal.        Judgment: Judgment normal.      LABORATORY DATA:  I have reviewed the labs as listed.  CBC    Component Value Date/Time   WBC 9.4 12/15/2019 1041   RBC 4.42 12/15/2019 1041   HGB 11.5 (L) 12/15/2019 1041   HCT 34.9 (L) 12/15/2019 1041   PLT 236 12/15/2019 1041   MCV 79.0 (L) 12/15/2019 1041   MCH 26.0 (L) 12/15/2019 1041   MCHC 33.0 12/15/2019 1041   RDW 14.5 12/15/2019 1041   LYMPHSABS 2,801 12/15/2019 1041   MONOABS 0.5 11/20/2018 1229   EOSABS 113 12/15/2019 1041   BASOSABS 66 12/15/2019 1041   CMP Latest Ref Rng & Units 12/15/2019 04/09/2019 12/04/2018  Glucose 65 - 99 mg/dL 212(H) 196(H) 168(H)  BUN 7 - 25 mg/dL '7 12 14  ' Creatinine 0.60 - 0.93 mg/dL 1.07(H) 1.08(H) 1.03  Sodium 135 - 146 mmol/L 141 139 140  Potassium 3.5 - 5.3 mmol/L 3.3(L) 4.3 4.3  Chloride 98 - 110 mmol/L 104 104 105  CO2 20 - 32 mmol/L '26 25 27  ' Calcium 8.6 - 10.4 mg/dL 9.1 9.4 9.8  Total Protein 6.1 - 8.1 g/dL 7.0 7.4 7.8  Total Bilirubin 0.2 - 1.2 mg/dL 0.4 0.5 0.5  Alkaline Phos 39 - 117 U/L - - 45  AST 10 - 35 U/L '22 18 18  ' ALT 6 - 29 U/L '20 17 15    ' DIAGNOSTIC IMAGING:  I have independently reviewed the mammogram scans and discussed with the patient.  ASSESSMENT & PLAN:  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 ([place]) 1.  Stage Ia (T1cN0M0) right  breast cancer, ER positive/PR positive: -Patient was diagnosed with right breast cancer in 1997. -Right breast cancer measuring 1.1 x 1.2 cm, grade 2, status post right mastectomy with regional axillary lymph node dissection on 10/17/1995; all 8 axillary lymph nodes were negative for metastatic disease. -She had adjuvant chemotherapy consisting of Adriamycin/Cytoxan x4 cycles. -Antiestrogen therapy with tamoxifen x5 years was completed and 03/2001. -Patient was not seen at the clinic last year due to Covid. -Patient may be discharged from clinic however she would like for [place] to follow her labs and yearly mammograms. -Last mammogram done on 02/07/2020 showed B  RADS category 1 negative. -We will see her back in 1 year with repeat labs mammogram and DEXA scan.     Orders placed this encounter:  Orders Placed This Encounter  Procedures  . DG Bone Density  . MM 3D SCREEN BREAST UNI LEFT  . Lactate dehydrogenase  . CBC with Differential/Platelet  . Comprehensive metabolic panel  . Ferritin  . Iron and TIBC  . Vitamin B12  . VITAMIN D 25 Hydroxy (Vit-D Deficiency, Fractures)  . Folate     Francene Finders, FNP-C [place] [phone]

## 2020-02-14 ENCOUNTER — Ambulatory Visit (HOSPITAL_COMMUNITY): Payer: Medicare Other

## 2020-02-14 ENCOUNTER — Other Ambulatory Visit (HOSPITAL_COMMUNITY): Payer: Medicare Other

## 2020-02-18 ENCOUNTER — Other Ambulatory Visit: Payer: Self-pay | Admitting: Family Medicine

## 2020-02-23 ENCOUNTER — Other Ambulatory Visit (INDEPENDENT_AMBULATORY_CARE_PROVIDER_SITE_OTHER): Payer: Medicare Other

## 2020-02-23 ENCOUNTER — Other Ambulatory Visit: Payer: Self-pay

## 2020-02-23 DIAGNOSIS — E1165 Type 2 diabetes mellitus with hyperglycemia: Secondary | ICD-10-CM | POA: Diagnosis not present

## 2020-02-23 LAB — BASIC METABOLIC PANEL
BUN: 8 mg/dL (ref 6–23)
CO2: 28 mEq/L (ref 19–32)
Calcium: 9.4 mg/dL (ref 8.4–10.5)
Chloride: 103 mEq/L (ref 96–112)
Creatinine, Ser: 1.15 mg/dL (ref 0.40–1.20)
GFR: 55.96 mL/min — ABNORMAL LOW (ref >60.00)
Glucose, Bld: 246 mg/dL — ABNORMAL HIGH (ref 70–99)
Potassium: 3.7 mEq/L (ref 3.5–5.1)
Sodium: 138 mEq/L (ref 135–145)

## 2020-02-23 LAB — MICROALBUMIN / CREATININE URINE RATIO
Creatinine,U: 99.6 mg/dL
Microalb Creat Ratio: 7.9 mg/g (ref 0.0–30.0)
Microalb, Ur: 7.9 mg/dL — ABNORMAL HIGH (ref 0.0–1.9)

## 2020-02-24 ENCOUNTER — Other Ambulatory Visit: Payer: Self-pay

## 2020-02-24 LAB — FRUCTOSAMINE: Fructosamine: 392 umol/L — ABNORMAL HIGH (ref 0–285)

## 2020-02-28 ENCOUNTER — Encounter: Payer: Self-pay | Admitting: Endocrinology

## 2020-02-28 ENCOUNTER — Ambulatory Visit (INDEPENDENT_AMBULATORY_CARE_PROVIDER_SITE_OTHER): Payer: Medicare Other | Admitting: Endocrinology

## 2020-02-28 ENCOUNTER — Other Ambulatory Visit: Payer: Self-pay

## 2020-02-28 VITALS — BP 128/74 | HR 80 | Ht 66.5 in | Wt 187.4 lb

## 2020-02-28 DIAGNOSIS — E1165 Type 2 diabetes mellitus with hyperglycemia: Secondary | ICD-10-CM

## 2020-02-28 DIAGNOSIS — I10 Essential (primary) hypertension: Secondary | ICD-10-CM

## 2020-02-28 LAB — GLUCOSE, POCT (MANUAL RESULT ENTRY): POC Glucose: 240 mg/dl — AB (ref 70–99)

## 2020-02-28 NOTE — Progress Notes (Signed)
Per instruction of Dr. Dwyane Dee, pt was given sample of Rybelsus 3mg  tablets.  LOT: [id number] EX:07/2020

## 2020-02-28 NOTE — Progress Notes (Signed)
Patient ID: [name], female   DOB: 04/03/1946, 74 y.o.   MRN: [id number]          Reason for Appointment: Follow-up for Type 2 Diabetes   History of Present Illness:          Date of diagnosis of type 2 diabetes mellitus: 1997       Background history: She has been on metformin since diagnosis Also has been on Amaryl or glipizide in the past Has had only infrequent A1c was done according to the current records that are available, A1c has been persistently high except in 2014 when it was 7.2 She thinks she had a rash for Januvia and this was stopped in 2016  Recent history:     Non-insulin hypoglycemic drugs : Farxiga 5 mg daily, Amaryl 4 mg in the morning and 4 mg at bedtime   Current management, blood sugar patterns and problems identified:  Her A1c has gone up to 8.5 as of 11/2019 compared to 8.2, has been as low as 6.5 previously Fructosamine is 392   She is not taking Metformin since her last visit because of issues with diarrhea and this has resolved  She is not taking [place] as directed for about a month  With this her blood sugars do not appear to be any better than before  Fasting glucose in the lab was 246 last week  She has had difficulties with her glucose meter and did not bring it for download and not clear how often she is monitoring  She still gets some readings over 200 after meals  No side effects with [place] Glade although she said that she has excessive and frequent urination  She is continuing to be drinking sweet tea, regular soft drinks and getting a lot of fruits in her diet  As before has not done much walking  Her weight is about the same  She has been regular with her Amaryl  Side effects from medications have been: Diarrhea from metformin, rash from Januvia, candidiasis from Jardiance  Compliance with the medical regimen improving Hypoglycemia: None  documented  Glucose monitoring:  done 0-2 times a day         Glucometer:  Accu-Chek  [facility]iva     Blood Glucose readings: By recall   Range recently: 160-220   Self-care: The diet that the patient has been following is: None, she periodically will eat fried food, has snacks like popcorn    Typical meal intake: Breakfast is variable, sometimes only a fruit Mealtimes are irregular              Dietician visit, most recent: 7/19                Weight history:Maximum weight 214  Wt Readings from Last 3 Encounters:  02/28/20 187 lb 6.4 oz (85 kg)  01/26/20 188 lb 6.4 oz (85.5 kg)  01/05/20 189 lb (85.7 kg)    Glycemic control:   Lab Results  Component Value Date   HGBA1C 8.5 (H) 12/15/2019   HGBA1C 8.2 (H) 04/09/2019   HGBA1C 7.7 (H) 12/04/2018   Lab Results  Component Value Date   MICROALBUR 7.9 (H) 02/23/2020   LDLCALC 110 (H) 12/15/2019   CREATININE 1.15 02/23/2020   Lab Results  Component Value Date   MICRALBCREAT 7.9 02/23/2020    Lab Results  Component Value Date   FRUCTOSAMINE 392 (H) 02/23/2020      Allergies as of 02/28/2020  Reactions   Demerol [meperidine] Other (See Comments)   Lost consciousness   Bentyl [dicyclomine Hcl] Swelling   Invokana [canagliflozin] Hives   Metformin And Related Diarrhea   Protonix [pantoprazole Sodium] Swelling   Statins Other (See Comments)   myalgia   Ace Inhibitors Cough      Medication List       Accurate as of February 28, 2020 11:13 AM. If you have any questions, ask your nurse or doctor.        acetaminophen 325 MG tablet Commonly known as: TYLENOL Take 325 mg by mouth every 6 (six) hours as needed for mild pain.   Blood Glucose System Pak Kit Use as directed to monitor FSBS 1x daily. Dx: X83.3   Bystolic 20 MG Tabs Generic drug: Nebivolol HCl TAKE 1 TABLET BY MOUTH TWICE DAILY.   CINNAMON PO Take 1 capsule by mouth. Takes sometimes   famotidine 20 MG tablet Commonly known as: PEPCID Take 1 tablet (20 mg total) by mouth 2 (two) times daily.   Farxiga 5 MG Tabs tablet Generic  drug: dapagliflozin propanediol Take 5 mg by mouth daily.   furosemide 40 MG tablet Commonly known as: LASIX Take 1 tablet (40 mg total) by mouth every other day. What changed:   when to take this  reasons to take this   glimepiride 4 MG tablet Commonly known as: AMARYL Take 1 tablet (4 mg total) by mouth 2 (two) times daily. TAKE 1 TABLET BY MOUTH TWICE DAILY.   glucose blood test strip Commonly known as: Accu-Chek Guide 1 each by Other route as needed for other. Use as instructed to check blood sugar daily.   Lancets Super Thin 28G Misc Use to check blood sugar once daily.   losartan 25 MG tablet Commonly known as: COZAAR TAKE ONE TABLET BY MOUTH EVERY MORNING.   multivitamin tablet Take 1 tablet by mouth daily.   potassium chloride 10 MEQ tablet Commonly known as: KLOR-CON Take 1 tablet (10 mEq total) by mouth every other day.   tetrahydrozoline-zinc 0.05-0.25 % ophthalmic solution Commonly known as: VISINE-AC Place 2 drops into both eyes 3 (three) times daily as needed (dry eyes).   VITAMIN D3 PO Take 1 tablet by mouth daily.       Allergies:  Allergies  Allergen Reactions  . Demerol [Meperidine] Other (See Comments)    Lost consciousness  . Bentyl [Dicyclomine Hcl] Swelling  . Invokana [Canagliflozin] Hives  . Metformin And Related Diarrhea  . Protonix [Pantoprazole Sodium] Swelling  . Statins Other (See Comments)    myalgia  . Ace Inhibitors Cough    Past Medical History:  Diagnosis Date  . Abdominal hernia 02/26/12   unrepaired  . 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 Anemia   . Anxiety   . Aortic atherosclerosis ([place])   . Arthritis   . Blood transfusion 1980's  . Breast cancer ([place])   . Complication of anesthesia    has a hard time waking up; can't lay flat  . Degenerative disc disease, lumbar    pressing on L3 and L4  . Diabetes mellitus ([place]) 1989   Type 2 NIDDM; "cancer treatment gave me  diabetes"  . Diverticulosis   . DJD (degenerative joint disease) of lumbar spine   . Dysrhythmia    "irregular"  . Exertional dyspnea   . Gastroparesis   . GERD (gastroesophageal reflux disease)    mann  .  Heart murmur    "think I outgrew it"  . Hepatic steatosis   . History of kidney stones   . History of stomach ulcers   . Hx: UTI (urinary tract infection)   . Hyperlipidemia   . Hypersensitivity    in tongue  . Hypertension   . IBS (irritable bowel syndrome)   . IBS (irritable bowel syndrome)   . Insomnia   . Internal hemorrhoids   . Kidney cysts    RT KIDNEY  . Kidney stones 2009   s/p lithotripsy  . Obesity   . PONV (postoperative nausea and vomiting)   . Pulmonary nodule   . Shortness of breath    unable to lie flat  . Ventral hernia     Past Surgical History:  Procedure Laterality Date  . ABDOMINAL HYSTERECTOMY  2002  . APPENDECTOMY    . BACK SURGERY     Can't take any medical procedure in right arm  . BREAST BIOPSY     right  . CATARACT EXTRACTION W/ INTRAOCULAR LENS  IMPLANT, BILATERAL  2009-2010  . CESAREAN SECTION  1973; 1978; 1981  . COLONOSCOPY WITH PROPOFOL N/A 10/29/2016   Procedure: COLONOSCOPY WITH PROPOFOL;  Surgeon: Danie Binder, MD;  [facility];  Service: Endoscopy;  Laterality: N/A;  10:00 am  . CYSTOSCOPY Left 04/13/2014   Procedure: CYSTOSCOPY FLEXIBLE;  Surgeon: Ardis Hughs, MD;  Location: [facility];  Service: Urology;  Laterality: Left;  with STENT  . ESOPHAGOGASTRODUODENOSCOPY (EGD) WITH PROPOFOL N/A 10/29/2016   Procedure: ESOPHAGOGASTRODUODENOSCOPY (EGD) WITH PROPOFOL;  Surgeon: Danie Binder, MD;  [facility];  Service: Endoscopy;  Laterality: N/A;  . EYE SURGERY    . implant and screws put in the right lower jaw  11/2008   dental surgery  . kidney blockage  ?1990's   " major surgery ;put kidney on pump for awhile"  . LITHOTRIPSY     "several times"  . LUMBAR FUSION  08/2010  . MASTECTOMY  1997   right    . NEPHROLITHOTOMY Left 04/13/2014   Procedure: LEFT PERCUTANEOUS NEPHROLITHOTOMY WITH SURGEON ACCESS;  Surgeon: Ardis Hughs, MD;  Location: [facility];  Service: Urology;  Laterality: Left;  . OVARIAN CYST SURGERY    . PARATHYROIDECTOMY  02/26/2012   Procedure: PARATHYROIDECTOMY;  Surgeon: Ascencion Dike, MD;  Location: [facility];  Service: ENT;;  . Azzie Almas DILATION N/A 10/29/2016   Procedure: SAVORY DILATION;  Surgeon: Danie Binder, MD;  [facility];  Service: Endoscopy;  Laterality: N/A;  . [place] SURGERY  2011  . urological surgery for blocked ureter secondary to kidney stone      Family History  Problem Relation Age of Onset  . Heart attack Mother   . Cancer Brother        Esophageal; smoker; deceased 73s  . Colon cancer Brother   . Diabetes Daughter   . Leukemia Paternal Aunt        deceased 34s  . Pancreatic cancer Paternal Aunt        deceased 39  . Cancer Paternal Aunt        GI cancer; deceased 37s  . Cancer Cousin        kidney cancer; deceased 48; pat first cousin; daughter of aunt w/ leukemia  . Cancer Cousin        colon cancer @ 32; pat first cousin; son of aunt with GI cancer  . Anesthesia problems Neg Hx  Social History:  reports that she has never smoked. She has never used smokeless tobacco. She reports current alcohol use. She reports that she does not use drugs.   Review of Systems   Lipid history: Not on treatment, is statin intolerant LDL has been variable     Lab Results  Component Value Date   CHOL 194 12/15/2019   CHOL 204 (H) 04/09/2019   CHOL 154 01/16/2018   Lab Results  Component Value Date   HDL 57 12/15/2019   HDL 56 04/09/2019   HDL 47 (L) 01/16/2018   Lab Results  Component Value Date   LDLCALC 110 (H) 12/15/2019   LDLCALC 121 (H) 04/09/2019   LDLCALC 88 01/16/2018   Lab Results  Component Value Date   TRIG 152 (H) 12/15/2019   TRIG 159 (H) 04/09/2019   TRIG 98 01/16/2018   Lab Results  Component Value Date    CHOLHDL 3.4 12/15/2019   CHOLHDL 3.6 04/09/2019   CHOLHDL 3.3 01/16/2018   No results found for: LDLDIRECT          Hypertension: Blood pressure treated with losartan 25 and Bystolic prescribed by her PCP Has a meter at home but does not check readings  BP Readings from Last 3 Encounters:  02/28/20 128/74  01/26/20 136/70  01/05/20 134/76    Most recent eye exam was in 3/18  Most recent foot exam: 3/19    LABS:  Office Visit on 02/28/2020  Component Date Value Ref Range Status  . POC Glucose 02/28/2020 240* 70 - 99 mg/dl Final  Lab on 02/23/2020  Component Date Value Ref Range Status  . Fructosamine 02/23/2020 392* 0 - 285 umol/L Final   Comment: Published reference interval for apparently healthy subjects between age 73 and 66 is 52 - 285 umol/L and in a poorly controlled diabetic population is 228 - 563 umol/L with a mean of 396 umol/L.   Marland Kitchen Microalb, Ur 02/23/2020 7.9* 0.0 - 1.9 mg/dL Final  . Creatinine,U 02/23/2020 99.6  mg/dL Final  . Microalb Creat Ratio 02/23/2020 7.9  0.0 - 30.0 mg/g Final  . Sodium 02/23/2020 138  135 - 145 mEq/L Final  . Potassium 02/23/2020 3.7  3.5 - 5.1 mEq/L Final  . Chloride 02/23/2020 103  96 - 112 mEq/L Final  . CO2 02/23/2020 28  19 - 32 mEq/L Final  . Glucose, Bld 02/23/2020 246* 70 - 99 mg/dL Final  . BUN 02/23/2020 8  6 - 23 mg/dL Final  . Creatinine, Ser 02/23/2020 1.15  0.40 - 1.20 mg/dL Final  . GFR 02/23/2020 55.96* >60.00 mL/min Final  . Calcium 02/23/2020 9.4  8.4 - 10.5 mg/dL Final    Physical Examination:  BP 128/74 (BP Location: Left Arm, Patient Position: Sitting, Cuff Size: Normal)   Pulse 80   Ht 5' 6.5" (1.689 m)   Wt 187 lb 6.4 oz (85 kg)   SpO2 97%   BMI 29.79 kg/m     No ankle edema present  ASSESSMENT:  Diabetes type 2, mild obesity  See history of present illness for detailed discussion of current diabetes management, blood sugar patterns and problems identified  Her A1c is last  8.5 Fructosamine is 392  Currently with Amaryl and Farxiga her blood sugars are still overall not controlled and at least in the lab and today over 200 consistently She likely has insulin deficiency since even her fasting glucose is 1/200 Previously reported abdominal pain from Trulicity and refuses to retry this She is  also not able to control regular soft drinks and sweet tea   Mild hypertension: Well-controlled and not higher with stopping losartan, now taking [place] Also no change in renal function with starting [place]  Urine microalbumin normal  PLAN:    She is willing to give Rybelsus to try instead of Trulicity This is especially since she is complaining about increased appetite  Discussed with the patient the nature of GLP-1 drugs, the actions on insulin secretion, slowing stomach emptying, reduction of appetite and reduced liver glucose production Explained that Rybelsus improves blood sugar control as well as produces weight loss and reduces cardiovascular events. Explained possible side effects especially nausea and vomiting that may occur in the first few days; usually side effects improve with time.  Patient to call if nausea or vomiting does not improve within 2 weeks Instructed to take the capsules on empty stomach 30 minutes before breakfast with 4 ounces of water daily.  Sample of 3 mg for 30 days and patient education material given She will call in a couple of weeks and let us know if she is doing well and can prescribe to 3 mg prescription  She was told to not keep any regular soft drinks or sweet drinks at home Sample of new meter given and she will try to check readings after meals also  We will continue Amaryl and Farxiga unchanged but consider reducing the dose of Amaryl if blood sugars are low normal  There are no Patient Instructions on file for this visit.      Elayne Snare 02/28/2020, 11:13 AM   Note: This office note was prepared with Dragon voice  recognition system technology. Any transcriptional errors that result from this process are unintentional.

## 2020-02-28 NOTE — Patient Instructions (Addendum)
Check blood sugars on waking up 2-3 days a week  Also check blood sugars about 2 hours after meals and do this after different meals by rotation  Recommended blood sugar levels on waking up are 90-130 and about 2 hours after meal is 130-160  Please bring your blood sugar monitor to each visit, thank you  Rybelsus with 1/2 glass water in am then eat 1/2 hr. later

## 2020-03-20 ENCOUNTER — Other Ambulatory Visit: Payer: Self-pay | Admitting: Family Medicine

## 2020-03-28 ENCOUNTER — Telehealth: Payer: Self-pay | Admitting: Endocrinology

## 2020-03-28 ENCOUNTER — Other Ambulatory Visit: Payer: Self-pay

## 2020-03-28 MED ORDER — RYBELSUS 7 MG PO TABS: 1.0000 | ORAL_TABLET | Freq: Every day | ORAL | 0 refills | Status: DC

## 2020-03-28 NOTE — Telephone Encounter (Signed)
Rx sent 

## 2020-03-28 NOTE — Telephone Encounter (Signed)
Called pt and spoke with her regarding this. She stated that she does not know if her blood sugars have been above or below because she does not check it very much due to it giving her anxiety. Pt reports that she hyperventilates when she has to stick herself and because of this, she does not do it often. Pt will look at her meter when she gets home and call this office back and let us know what the blood sugars have been ranging.

## 2020-03-28 NOTE — Telephone Encounter (Signed)
Medication RX Request  Did you call your pharmacy and request this refill first? Yes  . If patient has not contacted pharmacy first, instruct them to do so for future refills.  . Remind them that contacting the pharmacy for their refill is the quickest method to get the refill.  . Refill policy also stated that it will take anywhere between 24-72 hours to receive the refill.    Name of medication? rybelsus 3 mg - patient states she got a sample for this and shes almost out.  Is this a 90 day supply? Patient unsure  Name and location of pharmacy?  [facility], [place] Phone:  [phone]  Fax:  [phone]

## 2020-03-28 NOTE — Telephone Encounter (Signed)
In that case it is better to go up to 7 mg with the new prescription since this will improve her A1c more than the 3 mg will

## 2020-03-28 NOTE — Telephone Encounter (Signed)
Ok to send

## 2020-03-28 NOTE — Telephone Encounter (Signed)
If her blood sugars are recently all below 150 she can continue 3 mg with a prescription otherwise go up to 7 mg daily

## 2020-04-03 ENCOUNTER — Other Ambulatory Visit: Payer: Self-pay | Admitting: Internal Medicine

## 2020-04-12 ENCOUNTER — Ambulatory Visit (INDEPENDENT_AMBULATORY_CARE_PROVIDER_SITE_OTHER): Payer: Medicare Other | Admitting: Endocrinology

## 2020-04-12 ENCOUNTER — Other Ambulatory Visit: Payer: Self-pay

## 2020-04-12 ENCOUNTER — Encounter: Payer: Self-pay | Admitting: Endocrinology

## 2020-04-12 VITALS — BP 130/80 | HR 87 | Ht 66.5 in | Wt 186.0 lb

## 2020-04-12 DIAGNOSIS — E1165 Type 2 diabetes mellitus with hyperglycemia: Secondary | ICD-10-CM | POA: Diagnosis not present

## 2020-04-12 LAB — GLUCOSE, POCT (MANUAL RESULT ENTRY): POC Glucose: 189 mg/dl — AB (ref 70–99)

## 2020-04-12 LAB — POCT GLYCOSYLATED HEMOGLOBIN (HGB A1C): Hemoglobin A1C: 7.8 % — AB (ref 4.0–5.6)

## 2020-04-12 MED ORDER — DAPAGLIFLOZIN PROPANEDIOL 5 MG PO TABS: 5.0000 mg | ORAL_TABLET | Freq: Every day | ORAL | 3 refills | Status: DC

## 2020-04-12 MED ORDER — RYBELSUS 7 MG PO TABS: 1.0000 | ORAL_TABLET | Freq: Every day | ORAL | 3 refills | Status: DC

## 2020-04-12 NOTE — Patient Instructions (Addendum)
Take 1/2 FARXIGA  Check blood sugars on waking up 3 days a week  Also check blood sugars about 2 hours after meals and do this after different meals by rotation  Recommended blood sugar levels on waking up are 90-130 and about 2 hours after meal is 130-160  Please bring your blood sugar monitor to each visit, thank you

## 2020-04-12 NOTE — Progress Notes (Signed)
Patient ID: [name], female   DOB: 1946-09-14, 74 y.o.   MRN: [id number]          Reason for Appointment: Follow-up for Type 2 Diabetes   History of Present Illness:          Date of diagnosis of type 2 diabetes mellitus: 1997       Background history: She has been on metformin since diagnosis Also has been on Amaryl or glipizide in the past Has had only infrequent A1c was done according to the current records that are available, A1c has been persistently high except in 2014 when it was 7.2 She thinks she had a rash for Januvia and this was stopped in 2016  Recent history:     Non-insulin hypoglycemic drugs : Rybelsus 7 mg daily, Amaryl 4 mg in the morning and 4 mg at bedtime   Current management, blood sugar patterns and problems identified:  Her A1c has slightly improved at 7.8 compared to 8.5  Fructosamine last 392   She had stopped her Metformin and Farxiga prior to her last visit when her blood sugars were increasing  She was given a trial of RYBELSUS  She tolerated this is with the 3 mg sample and for the last 10 days or so has been on 7 mg daily in the morning  She tolerates this better than Trulicity, she said that she does well especially if she tries to eat about half an hour later  However she is again very afraid of sticking her finger and has only done a couple of readings in the last month at home  Today blood sugar is 189 without any food  Blood sugar was 159 last Saturday possibly fasting, previously on 7/6 blood sugar was 295 after lunch  She is still drinking 1 regular soft drink daily although is cutting back on sweet tea and other drinks  She still eats fruits frequently  Otherwise she thinks her portions are small  Only just recently has started doing a lot of walking  Her weight is about the same  Labs done today include A1c only  Side effects from medications have been: Diarrhea from metformin, rash from Januvia, candidiasis from  Jardiance  Compliance with the medical regimen improving Hypoglycemia: None  documented  Glucose monitoring:  done 0-2 times a day         Glucometer:  Accu-Chek Aviva     Blood Glucose readings: As above   Self-care: The diet that the patient has been following is: None, she periodically will eat fried food, has snacks like popcorn    Typical meal intake: Breakfast is variable, sometimes only a fruit Mealtimes are irregular              Dietician visit, most recent: 7/19                Weight history:Maximum weight 214  Wt Readings from Last 3 Encounters:  04/12/20 186 lb (84.4 kg)  02/28/20 187 lb 6.4 oz (85 kg)  01/26/20 188 lb 6.4 oz (85.5 kg)    Glycemic control:   Lab Results  Component Value Date   HGBA1C 7.8 (A) 04/12/2020   HGBA1C 8.5 (H) 12/15/2019   HGBA1C 8.2 (H) 04/09/2019   Lab Results  Component Value Date   MICROALBUR 7.9 (H) 02/23/2020   LDLCALC 110 (H) 12/15/2019   CREATININE 1.15 02/23/2020   Lab Results  Component Value Date   MICRALBCREAT 7.9 02/23/2020    Lab  Results  Component Value Date   FRUCTOSAMINE 392 (H) 02/23/2020      Allergies as of 04/12/2020      Reactions   Demerol [meperidine] Other (See Comments)   Lost consciousness   Bentyl [dicyclomine Hcl] Swelling   Invokana [canagliflozin] Hives   Metformin And Related Diarrhea   Protonix [pantoprazole Sodium] Swelling   Statins Other (See Comments)   myalgia   Ace Inhibitors Cough      Medication List       Accurate as of April 12, 2020 11:33 AM. If you have any questions, ask your nurse or doctor.        acetaminophen 325 MG tablet Commonly known as: TYLENOL Take 325 mg by mouth every 6 (six) hours as needed for mild pain.   Blood Glucose System Pak Kit Use as directed to monitor FSBS 1x daily. Dx: Y77.4   Bystolic 20 MG Tabs Generic drug: Nebivolol HCl TAKE 1 TABLET BY MOUTH TWICE DAILY.   CINNAMON PO Take 1 capsule by mouth. Takes sometimes   famotidine  20 MG tablet Commonly known as: PEPCID TAKE ONE TABLET BY MOUTH TWICE DAILY.   Farxiga 5 MG Tabs tablet Generic drug: dapagliflozin propanediol Take 5 mg by mouth daily.   furosemide 40 MG tablet Commonly known as: LASIX Take 1 tablet (40 mg total) by mouth every other day. What changed:   when to take this  reasons to take this   glimepiride 4 MG tablet Commonly known as: AMARYL Take 1 tablet (4 mg total) by mouth 2 (two) times daily. TAKE 1 TABLET BY MOUTH TWICE DAILY.   glucose blood test strip Commonly known as: Accu-Chek Guide 1 each by Other route as needed for other. Use as instructed to check blood sugar daily.   Lancets Super Thin 28G Misc Use to check blood sugar once daily.   multivitamin tablet Take 1 tablet by mouth daily.   potassium chloride 10 MEQ tablet Commonly known as: KLOR-CON Take 1 tablet (10 mEq total) by mouth every other day.   Rybelsus 7 MG Tabs Generic drug: Semaglutide Take 1 tablet by mouth daily.   tetrahydrozoline-zinc 0.05-0.25 % ophthalmic solution Commonly known as: VISINE-AC Place 2 drops into both eyes 3 (three) times daily as needed (dry eyes).   VITAMIN D3 PO Take 1 tablet by mouth daily.       Allergies:  Allergies  Allergen Reactions  . Demerol [Meperidine] Other (See Comments)    Lost consciousness  . Bentyl [Dicyclomine Hcl] Swelling  . Invokana [Canagliflozin] Hives  . Metformin And Related Diarrhea  . Protonix [Pantoprazole Sodium] Swelling  . Statins Other (See Comments)    myalgia  . Ace Inhibitors Cough    Past Medical History:  Diagnosis Date  . Abdominal hernia 02/26/12   unrepaired  . 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 Anemia   . Anxiety   . Aortic atherosclerosis ([place])   . Arthritis   . Blood transfusion 1980's  . Breast cancer ([place])   . Complication of anesthesia    has a hard time waking up; can't lay flat  . Degenerative disc disease, lumbar     pressing on L3 and L4  . Diabetes mellitus ([place]) 1989   Type 2 NIDDM; "cancer treatment gave me diabetes"  . Diverticulosis   . DJD (degenerative joint disease) of lumbar spine   . Dysrhythmia    "irregular"  . Exertional  dyspnea   . Gastroparesis   . GERD (gastroesophageal reflux disease)    mann  . Heart murmur    "think I outgrew it"  . Hepatic steatosis   . History of kidney stones   . History of stomach ulcers   . Hx: UTI (urinary tract infection)   . Hyperlipidemia   . Hypersensitivity    in tongue  . Hypertension   . IBS (irritable bowel syndrome)   . IBS (irritable bowel syndrome)   . Insomnia   . Internal hemorrhoids   . Kidney cysts    RT KIDNEY  . Kidney stones 2009   s/p lithotripsy  . Obesity   . PONV (postoperative nausea and vomiting)   . Pulmonary nodule   . Shortness of breath    unable to lie flat  . Ventral hernia     Past Surgical History:  Procedure Laterality Date  . ABDOMINAL HYSTERECTOMY  2002  . APPENDECTOMY    . BACK SURGERY     Can't take any medical procedure in right arm  . BREAST BIOPSY     right  . CATARACT EXTRACTION W/ INTRAOCULAR LENS  IMPLANT, BILATERAL  2009-2010  . CESAREAN SECTION  1973; 1978; 1981  . COLONOSCOPY WITH PROPOFOL N/A 10/29/2016   Procedure: COLONOSCOPY WITH PROPOFOL;  Surgeon: Danie Binder, MD;  [facility];  Service: Endoscopy;  Laterality: N/A;  10:00 am  . CYSTOSCOPY Left 04/13/2014   Procedure: CYSTOSCOPY FLEXIBLE;  Surgeon: Ardis Hughs, MD;  Location: [facility];  Service: Urology;  Laterality: Left;  with STENT  . ESOPHAGOGASTRODUODENOSCOPY (EGD) WITH PROPOFOL N/A 10/29/2016   Procedure: ESOPHAGOGASTRODUODENOSCOPY (EGD) WITH PROPOFOL;  Surgeon: Danie Binder, MD;  [facility];  Service: Endoscopy;  Laterality: N/A;  . EYE SURGERY    . implant and screws put in the right lower jaw  11/2008   dental surgery  . kidney blockage  ?1990's   " major surgery ;put kidney on pump for  awhile"  . LITHOTRIPSY     "several times"  . LUMBAR FUSION  08/2010  . MASTECTOMY  1997   right  . NEPHROLITHOTOMY Left 04/13/2014   Procedure: LEFT PERCUTANEOUS NEPHROLITHOTOMY WITH SURGEON ACCESS;  Surgeon: Ardis Hughs, MD;  Location: [facility];  Service: Urology;  Laterality: Left;  . OVARIAN CYST SURGERY    . PARATHYROIDECTOMY  02/26/2012   Procedure: PARATHYROIDECTOMY;  Surgeon: Ascencion Dike, MD;  Location: [facility];  Service: ENT;;  . [name] DILATION N/A 10/29/2016   Procedure: SAVORY DILATION;  Surgeon: Danie Binder, MD;  [facility];  Service: Endoscopy;  Laterality: N/A;  . [facility] SURGERY  2011  . urological surgery for blocked ureter secondary to kidney stone      Family History  Problem Relation Age of Onset  . Heart attack Mother   . Cancer Brother        Esophageal; smoker; deceased 67s  . Colon cancer Brother   . Diabetes Daughter   . Leukemia Paternal Aunt        deceased 54s  . Pancreatic cancer Paternal Aunt        deceased 47  . Cancer Paternal Aunt        GI cancer; deceased 87s  . Cancer Cousin        kidney cancer; deceased 53; pat first cousin; daughter of aunt w/ leukemia  . Cancer Cousin        colon cancer @  71; pat first cousin; son of aunt with GI cancer  . Anesthesia problems Neg Hx     Social History:  reports that she has never smoked. She has never used smokeless tobacco. She reports current alcohol use. She reports that she does not use drugs.   Review of Systems   Lipid history: Not on treatment, is statin intolerant LDL has been variable     Lab Results  Component Value Date   CHOL 194 12/15/2019   CHOL 204 (H) 04/09/2019   CHOL 154 01/16/2018   Lab Results  Component Value Date   HDL 57 12/15/2019   HDL 56 04/09/2019   HDL 47 (L) 01/16/2018   Lab Results  Component Value Date   LDLCALC 110 (H) 12/15/2019   LDLCALC 121 (H) 04/09/2019   LDLCALC 88 01/16/2018   Lab Results  Component Value Date   TRIG 152 (H)  12/15/2019   TRIG 159 (H) 04/09/2019   TRIG 98 01/16/2018   Lab Results  Component Value Date   CHOLHDL 3.4 12/15/2019   CHOLHDL 3.6 04/09/2019   CHOLHDL 3.3 01/16/2018   No results found for: LDLDIRECT          Hypertension: Blood pressure treated with losartan 25 and Bystolic prescribed by her PCP Has a meter at home but does not check readings  BP Readings from Last 3 Encounters:  04/12/20 130/80  02/28/20 128/74  01/26/20 136/70    Most recent eye exam was in 3/20  Most recent foot exam: 3/21    LABS:  Office Visit on 04/12/2020  Component Date Value Ref Range Status  . POC Glucose 04/12/2020 189* 70 - 99 mg/dl Final  . Hemoglobin A1C 04/12/2020 7.8* 4.0 - 5.6 % Final    Physical Examination:  BP 130/80 (BP Location: Left Arm, Patient Position: Sitting, Cuff Size: Normal)   Pulse 87   Ht 5' 6.5" (1.689 m)   Wt 186 lb (84.4 kg)   SpO2 98%   BMI 29.57 kg/m     ASSESSMENT:  Diabetes type 2, mild obesity  See history of present illness for detailed discussion of current diabetes management, blood sugar patterns and problems identified  A1c 7.8  Currently with Amaryl and recently starting Rybelsus her blood sugars appear to be relatively better controlled but still not at target especially with fasting glucose 189 today Although she is controlling her diet better and only consuming 1 can of Pepsi daily she still can do better Not able to lose weight even with starting Rybelsus, this appears to be providing some satiety  Mild hypertension: Well-controlled even without Farxiga    PLAN:    Continue Rybelsus 7 mg daily However since blood sugars are still not controlled she will add at least 2.5 mg [place] daily Discussed that she needs better blood sugar control and weight loss which she is not able to achieve A1c target explained We will need to follow-up with fructosamine or A1c in 2 months  There are no Patient Instructions on file for this  visit.      Elayne Snare 04/12/2020, 11:33 AM   Note: This office note was prepared with Dragon voice recognition system technology. Any transcriptional errors that result from this process are unintentional.

## 2020-04-17 ENCOUNTER — Ambulatory Visit: Payer: Medicare Other | Admitting: Family Medicine

## 2020-04-17 ENCOUNTER — Other Ambulatory Visit: Payer: Self-pay

## 2020-04-17 ENCOUNTER — Encounter: Payer: Self-pay | Admitting: Family Medicine

## 2020-04-17 VITALS — BP 132/76 | HR 90 | Temp 97.9°F | Resp 14 | Ht 66.5 in | Wt 184.0 lb

## 2020-04-17 DIAGNOSIS — T466X5A Adverse effect of antihyperlipidemic and antiarteriosclerotic drugs, initial encounter: Secondary | ICD-10-CM

## 2020-04-17 DIAGNOSIS — I10 Essential (primary) hypertension: Secondary | ICD-10-CM

## 2020-04-17 DIAGNOSIS — E782 Mixed hyperlipidemia: Secondary | ICD-10-CM

## 2020-04-17 DIAGNOSIS — E1143 Type 2 diabetes mellitus with diabetic autonomic (poly)neuropathy: Secondary | ICD-10-CM | POA: Diagnosis not present

## 2020-04-17 DIAGNOSIS — E1165 Type 2 diabetes mellitus with hyperglycemia: Secondary | ICD-10-CM

## 2020-04-17 DIAGNOSIS — K580 Irritable bowel syndrome with diarrhea: Secondary | ICD-10-CM | POA: Diagnosis not present

## 2020-04-17 DIAGNOSIS — E663 Overweight: Secondary | ICD-10-CM

## 2020-04-17 DIAGNOSIS — M609 Myositis, unspecified: Secondary | ICD-10-CM

## 2020-04-17 NOTE — Progress Notes (Signed)
   Subjective:    Patient ID: [name], female    DOB: August 04, 1946, 74 y.o.   MRN: [id number]  Patient presents for Follow-up (is fasting)   Pt here to f/u chronic medical problems     DM- followed by endocrinology, she feels like she feels up and down with energy, her meds gets changed a lot and feels like she doest have time to adjust She often chooses food that settle better on her stomach even though they may make her sugars spike   she is taking Amaryl 4mg  BID , Rybelsus 7mg  and Fariga 2.5mg  once a day   Her A1C did improve from 8.5> 7.8% She is also concerned about the cost of the different meds  Osteopenia- followed by hematology    Chronic IBS- diarrhea- she is using pepcid for indigestion, reviewed GI note, her symptoms have improved, next step is colonoscopy if symptoms worsen to evaluate for microscopic colitis    HTN- bystolic no SE with the medication   Very rare use of lasix/potassium    Hyperlipidemia- LDL was  110   No new concerns      Review Of Systems:  GEN- denies fatigue, fever, weight loss,weakness, recent illness HEENT- denies eye drainage, change in vision, nasal discharge, CVS- denies chest pain, palpitations RESP- denies SOB, cough, wheeze ABD- denies N/V, change in stools, abd pain GU- denies dysuria, hematuria, dribbling, incontinence MSK- denies joint pain, muscle aches, injury Neuro- denies headache, dizziness, syncope, seizure activity       Objective:    BP (!) 132/76   Pulse 90   Temp 97.9 F (36.6 C) (Temporal)   Resp 14   Ht 5' 6.5" (1.689 m)   Wt 184 lb (83.5 kg)   SpO2 97%   BMI 29.25 kg/m  GEN- NAD, alert and oriented x3 HEENT- PERRL, EOMI, non injected sclera, pink conjunctiva, MMM, oropharynx clear Neck- Supple, no thyromegaly CVS- RRR, no murmur RESP-CTAB ABD-NABS,soft,NT,ND, ventral hernia EXT- No edema Pulses- Radial, DP- 2+      Assessment & Plan:    Approx 20 minutes spent with pt > 50% on medication  review, counseling meds    Problem List Items Addressed This Visit      Unprioritized   Diabetic neuropathy ([place])   Essential hypertension - Primary    Controlled no changes       Relevant Orders   Ambulatory referral to Chronic Care Management Services   Hyperlipidemia    Statin induced myopathy Improved lipids at last check with diet GLP-1 therapy may help as well       IBS (irritable bowel syndrome)    Reviewed GI note, symptoms currently controlled Next step is colonoscopy to r/o microscopic colitis       Overweight (BMI 25.0-29.9)   Statin-induced myositis   Uncontrolled type 2 diabetes mellitus with hyperglycemia, without long-term current use of insulin ([place])    Continue current regimen per endocrinology She has F/U In 2 months Hopefully she will be able to continue with Rebylsus though she is having mild GI discomfort Referral to care management pharmacy program to assist with cost       Relevant Orders   Ambulatory referral to Chronic Care Management Services      Note: This dictation was prepared with Dragon dictation along with smaller phrase technology. Any transcriptional errors that result from this process are unintentional.

## 2020-04-17 NOTE — Patient Instructions (Addendum)
Referral to pharmacy for cost of medications  F/U 4 months for Physical

## 2020-04-17 NOTE — Assessment & Plan Note (Signed)
Controlled no changes 

## 2020-04-17 NOTE — Assessment & Plan Note (Signed)
Continue current regimen per endocrinology She has F/U In 2 months Hopefully she will be able to continue with Rebylsus though she is having mild GI discomfort Referral to care management pharmacy program to assist with cost

## 2020-04-17 NOTE — Assessment & Plan Note (Signed)
Reviewed GI note, symptoms currently controlled Next step is colonoscopy to r/o microscopic colitis

## 2020-04-17 NOTE — Assessment & Plan Note (Signed)
Statin induced myopathy Improved lipids at last check with diet GLP-1 therapy may help as well

## 2020-04-18 ENCOUNTER — Telehealth: Payer: Self-pay | Admitting: Family Medicine

## 2020-04-18 NOTE — Progress Notes (Signed)
  Chronic Care Management   Note  04/18/2020 Name: [name] MRN: [id number] DOB: 06-Apr-1946  [name] is a 74 y.o. year old female who is a primary care patient of [place], Modena Nunnery, MD. I reached out to [name] by phone today in response to a referral sent by [name] PCP, [place], Modena Nunnery, MD.   [name] was given information about Chronic Care Management services today including:  1. CCM service includes personalized support from designated clinical staff supervised by her physician, including individualized plan of care and coordination with other care providers 2. 24/7 contact phone numbers for assistance for urgent and routine care needs. 3. Service will only be billed when office clinical staff spend 20 minutes or more in a month to coordinate care. 4. Only one practitioner may furnish and bill the service in a calendar month. 5. The patient may stop CCM services at any time (effective at the end of the month) by phone call to the office staff.   Patient agreed to services and verbal consent obtained.   Follow up plan:   [name] [employer] Scheduler

## 2020-04-19 ENCOUNTER — Telehealth: Payer: Self-pay | Admitting: *Deleted

## 2020-04-19 DIAGNOSIS — M25551 Pain in right hip: Secondary | ICD-10-CM

## 2020-04-19 MED ORDER — TRAMADOL HCL 50 MG PO TABS: 50.0000 mg | ORAL_TABLET | Freq: Two times a day (BID) | ORAL | 0 refills | Status: AC | PRN

## 2020-04-19 NOTE — Telephone Encounter (Signed)
She can be referred to orthopedics for hip pain, though this could also be her sciatica, We did not discuss this in detail with her other medication problems  For pain okay to send Tramadol 50mg  BID prn pain She can take tylenol as well  Due to diabetes steroids cant be used at this time

## 2020-04-19 NOTE — Telephone Encounter (Signed)
Call placed to patient and patient made aware.   Agreeable to prescription. Please approve to send to pharmacy.   Agreeable to referral. Orders placed.

## 2020-04-19 NOTE — Telephone Encounter (Signed)
Received call from patient.   Reports that she discussed intermittent discomfort in her R hip during OV on 04/17/2020.  States that pain has worsened and is now in R hip radiating down through knee and lower leg.   States that it is near constant at this time. Reports that she cannot get comfortable in chair or bed to rest.   MD please advise.

## 2020-04-21 ENCOUNTER — Other Ambulatory Visit: Payer: Self-pay | Admitting: Endocrinology

## 2020-04-24 NOTE — Chronic Care Management (AMB) (Signed)
Chronic Care Management Pharmacy  Name: [name]  MRN: [id number] DOB: 1946/05/24  Chief Complaint/ HPI  [name],  74 y.o. , female presents for their Initial CCM visit with the clinical pharmacist via telephone.  PCP : Alycia Rossetti, MD  Their chronic conditions include: hypertension, IBS, Type II DM with neuropathy, DDD, hyperlipidemia.  Office Visits: 04/17/2020 [facility]) -   Feels up and down on energy, states her meds are changed a lot  Needs help with Rybelsus copay, also on Farxiga  History of statin induced myopathy  Consult Visit: 02/28/2020 [facility]) -   Was not taking Metformin due to diarrhea, not taking Farxiga  Unclear monitoring history, meter issue  Medications: Outpatient Encounter Medications as of 04/25/2020  Medication Sig  . acetaminophen (TYLENOL) 325 MG tablet Take 325 mg by mouth every 6 (six) hours as needed for mild pain.   . Blood Glucose Monitoring Suppl (BLOOD GLUCOSE SYSTEM PAK) KIT Use as directed to monitor FSBS 1x daily. Dx: U27.2  . BYSTOLIC 20 MG TABS TAKE 1 TABLET BY MOUTH TWICE DAILY.  Marland Kitchen Cholecalciferol (VITAMIN D3 PO) Take 1 tablet by mouth daily.  Marland Kitchen CINNAMON PO Take 1 capsule by mouth. Takes sometimes  . dapagliflozin propanediol (FARXIGA) 5 MG TABS tablet Take 1 tablet (5 mg total) by mouth daily. (Patient taking differently: Take 2.5 mg by mouth daily. )  . famotidine (PEPCID) 20 MG tablet TAKE ONE TABLET BY MOUTH TWICE DAILY.  . furosemide (LASIX) 40 MG tablet Take 1 tablet (40 mg total) by mouth every other day. (Patient taking differently: Take 40 mg by mouth as needed. )  . glimepiride (AMARYL) 4 MG tablet TAKE 1 TABLET BY MOUTH TWICE DAILY.  Marland Kitchen glucose blood (ACCU-CHEK GUIDE) test strip 1 each by Other route as needed for other. Use as instructed to check blood sugar daily.  Marland Kitchen LANCETS SUPER THIN 28G MISC Use to check blood sugar once daily.  . Multiple Vitamin (MULTIVITAMIN) tablet Take 1 tablet by  mouth daily.  . potassium chloride (K-DUR) 10 MEQ tablet Take 1 tablet (10 mEq total) by mouth every other day. (Patient taking differently: Take 10 mEq by mouth every other day. )  . Semaglutide (RYBELSUS) 7 MG TABS Take 1 tablet by mouth daily.  Marland Kitchen tetrahydrozoline-zinc (VISINE-AC) 0.05-0.25 % ophthalmic solution Place 2 drops into both eyes 3 (three) times daily as needed (dry eyes).  . traMADol (ULTRAM) 50 MG tablet Take 1 tablet (50 mg total) by mouth every 12 (twelve) hours as needed for up to 15 days.   No facility-administered encounter medications on file as of 04/25/2020.     Current Diagnosis/Assessment:   Emergency planning/management officer Strain: Low Risk   . Difficulty of Paying Living Expenses: Not very hard    Goals Addressed            This Visit's Progress   . Pharmacy Care Plan:       CARE PLAN ENTRY (see longitudinal plan of care for additional care plan information)  Current Barriers:  . Chronic Disease Management support, education, and care coordination needs related to Hypertension, Hyperlipidemia, and Diabetes   Hypertension BP Readings from Last 3 Encounters:  04/17/20 (!) 132/76  04/12/20 130/80  02/28/20 128/74   . Pharmacist Clinical Goal(s): o Over the next 30 days, patient will work with PharmD and providers to maintain BP goal <130/80 . Current regimen:  o Bystolic 53GU daily . Interventions: o Reviewed most recent office BP o  Discussed diet/exercise . Patient self care activities - Over the next 30 days, patient will: o Check BP periodically, document, and provide at future appointments o Ensure daily salt intake < 2300 mg/day o Contact providers with consistent BP > 130/80  Hyperlipidemia Lab Results  Component Value Date/Time   LDLCALC 110 (H) 12/15/2019 10:41 AM   . Pharmacist Clinical Goal(s): o Over the next 30 days, patient will work with PharmD and providers to achieve LDL goal < 70 . Current regimen:  o No  medications . Interventions: o Reviewed most recent lipid panel o Discussed history of intolerance to statin . Patient self care activities - Over the next 30 days, patient will: o Focus on diet low in saturated fats and sugars to bring down LDL cholesterol  Diabetes Lab Results  Component Value Date/Time   HGBA1C 7.8 (A) 04/12/2020 11:32 AM   HGBA1C 8.5 (H) 12/15/2019 10:41 AM   HGBA1C 8.2 (H) 04/09/2019 12:13 PM   . Pharmacist Clinical Goal(s): o Over the next 30 days, patient will work with PharmD and providers to achieve A1c goal <7% . Current regimen:  o Rybelsus 23m daily o Farxiga 577mdaily o Glimepiride 53m57maily . Interventions: o Reviewed home monitoring and A1c o Counseled on importance of eating with doses of glimepiride to avoid low blood sugars o Discussed dietary modifications to promote euglycemia. . Patient self care activities - Over the next 30 days, patient will: o Check blood sugar once daily, document, and provide at future appointments o Contact provider with any episodes of hypoglycemia o Work on diet and exercise to meet goal A1c of < 7%. Initial goal documentation        Diabetes   Recent Relevant Labs: Lab Results  Component Value Date/Time   HGBA1C 7.8 (A) 04/12/2020 11:32 AM   HGBA1C 8.5 (H) 12/15/2019 10:41 AM   HGBA1C 8.2 (H) 04/09/2019 12:13 PM   MICROALBUR 7.9 (H) 02/23/2020 11:15 AM   MICROALBUR 11.8 (H) 03/09/2018 08:42 AM     Checking BG: infrequently  No specific blood sugar logs available.  Patient admits to needle aversion and states it takes her 30 minutes to prick her finger for a blood sugar test.   Patient has failed these meds in past: Metformin (diarrhea) Patient is currently uncontrolled on the following medications:  Rybelsus 7mg28mblet  Farxiga 5mg 96mlet  Glimepiride 53mg t76met  Last diabetic Foot exam:  Lab Results  Component Value Date/Time   HMDIABEYEEXA No Retinopathy 03/24/2018 12:00 AM    Last  diabetic Eye exam:  Lab Results  Component Value Date/Time   HMDIABFOOTEX yes  07/23/2010 12:00 AM     We discussed: Patient admittedly does not check blood sugars regularly.  She reports that she does have dizziness sometimes after she takes her medications.  Admitted to not eating regular meals and often takes glimepiride and does not eat.  A1c still elevated so I do not suspect she is having hypoglycemia often but counseled on making sure she eats with doses of glimepiride to avoid possibility of hypoglycemia.  Counseled on diet recommendations, 1 serving of carbs per meal.  Does admit to 1 regular soda per day which is an improvement from 5-6 previously.  Will review programs for Rybelsus and Farxiga copays.  Patient reports income around 60k per year so patient assistance programs may be hard to qualify for.  Plan  Continue current medications, make sure to eat with doses of glimepiride.  Work on one serving of  carbohydrates per meal and eating smaller regular meals throughout the day.  Contact providers with issues with medication cost.  Hypertension   Office blood pressures are  BP Readings from Last 3 Encounters:  04/17/20 (!) 132/76  04/12/20 130/80  02/28/20 128/74    Patient has failed these meds in the past: ACE inhibitors (cough)  Patient checks BP at home infrequently  Patient home BP readings are ranging: no log available  Patient is currently controlled on the following medications: Bystolic 85ID daily  Does not check BP regularly. BP in office controlled. Patient has no complaints with Bystolic as of now.  If copay becomes an issue we could switch to generic beta blocker.  Plan  Continue current medications    IBS    Patient has failed these meds in past: none noted Patient is currently uncontrolled on the following medications:   No medications currently  We discussed:  Patient attributed poor diet to IBS.  States sometimes she just has to eat what she  can.  Discussed ways she could eat to avoid diarrhea as well as promote euglycemia.  Plan  Continue control with diet and exercise.  Hyperlipidemia   LDL goal < 70  Lipid Panel     Component Value Date/Time   CHOL 194 12/15/2019 1041   TRIG 152 (H) 12/15/2019 1041   HDL 57 12/15/2019 1041   LDLCALC 110 (H) 12/15/2019 1041    Hepatic Function Latest Ref Rng & Units 12/15/2019 04/09/2019 12/04/2018  Total Protein 6.1 - 8.1 g/dL 7.0 7.4 7.8  Albumin 3.5 - 5.2 g/dL - - 4.0  AST 10 - 35 U/L '22 18 18  ' ALT 6 - 29 U/L '20 17 15  ' Alk Phosphatase 39 - 117 U/L - - 45  Total Bilirubin 0.2 - 1.2 mg/dL 0.4 0.5 0.5  Bilirubin, Direct 0.0 - 0.3 mg/dL - - -     The 10-year ASCVD risk score Mikey Bussing DC Jr., et al., 2013) is: 31%   Values used to calculate the score:     Age: 24 years     Sex: Female     Is Non-Hispanic African American: Yes     Diabetic: Yes     Tobacco smoker: No     Systolic Blood Pressure: 782 mmHg     Is BP treated: Yes     HDL Cholesterol: 57 mg/dL     Total Cholesterol: 194 mg/dL   Patient has failed these meds in past: Statins (myopathy) Patient is currently uncontrolled on the following medications:  . None  We discussed:  Patient is working on diet, cannot do much exercise due to sciatica pain she is experiencing.  Recommend trying to with diet but would like to see LDL decrease even further.  Plan  Continue current medications, recommend repeat lipid panel if still elevated would consider zetia since she has a history of myalgias with statins although that was in 2011. DDD   Patient has failed these meds in past: none noted Patient is currently controlled on the following medications:  . Tramadol 76m prn  Patient reports pain is manageable.  Currently experiencing sciatica pain and is seeing ortho about it soon.  Plan  Continue current medications, follow up with ortho. Vaccines   Reviewed and discussed patient's vaccination history.    Immunization  History  Administered Date(s) Administered  . Influenza Whole 07/02/2006  . Influenza, High Dose Seasonal PF 06/10/2018, 06/30/2019  . Influenza,inj,Quad PF,6+ Mos 06/03/2013, 06/10/2014, 07/13/2015, 07/03/2016  .  Influenza-Unspecified 06/24/2019  . PFIZER SARS-COV-2 Vaccination 10/25/2019, 11/21/2019  . Pneumococcal Conjugate-13 06/27/2014  . Pneumococcal Polysaccharide-23 01/16/2018  . Pneumococcal-Unspecified 12/23/2011  . Tdap 12/11/2011    Plan  Recommended patient receive Shingles vaccine in office.  Medication Management   . Patient currently uses [employer].   . Patient reports using no specfic method to organize medications and promote adherence. o Reports some adherence issues, recommended pill box. . Patient reports a few missed doses of medication.   Beverly Milch, PharmD Clinical Pharmacist [place] [phone]

## 2020-04-25 ENCOUNTER — Other Ambulatory Visit: Payer: Self-pay

## 2020-04-25 ENCOUNTER — Ambulatory Visit: Payer: Medicare Other | Admitting: Pharmacist

## 2020-04-25 DIAGNOSIS — I10 Essential (primary) hypertension: Secondary | ICD-10-CM

## 2020-04-25 DIAGNOSIS — E782 Mixed hyperlipidemia: Secondary | ICD-10-CM

## 2020-04-25 DIAGNOSIS — E1165 Type 2 diabetes mellitus with hyperglycemia: Secondary | ICD-10-CM

## 2020-04-25 NOTE — Patient Instructions (Addendum)
Visit Information Thank you for meeting with me today!  I look forward to working with you to help you meet all of your healthcare goals and answer any questions you may have.  Feel free to contact me anytime!  Goals Addressed            This Visit's Progress   . Pharmacy Care Plan:       CARE PLAN ENTRY (see longitudinal plan of care for additional care plan information)  Current Barriers:  . Chronic Disease Management support, education, and care coordination needs related to Hypertension, Hyperlipidemia, and Diabetes   Hypertension BP Readings from Last 3 Encounters:  04/17/20 (!) 132/76  04/12/20 130/80  02/28/20 128/74   . Pharmacist Clinical Goal(s): o Over the next 30 days, patient will work with PharmD and providers to maintain BP goal <130/80 . Current regimen:  o Bystolic 20mg  daily . Interventions: o Reviewed most recent office BP o Discussed diet/exercise . Patient self care activities - Over the next 30 days, patient will: o Check BP periodically, document, and provide at future appointments o Ensure daily salt intake < 2300 mg/day o Contact providers with consistent BP > 130/80  Hyperlipidemia Lab Results  Component Value Date/Time   LDLCALC 110 (H) 12/15/2019 10:41 AM   . Pharmacist Clinical Goal(s): o Over the next 30 days, patient will work with PharmD and providers to achieve LDL goal < 70 . Current regimen:  o No medications . Interventions: o Reviewed most recent lipid panel o Discussed history of intolerance to statin . Patient self care activities - Over the next 30 days, patient will: o Focus on diet low in saturated fats and sugars to bring down LDL cholesterol  Diabetes Lab Results  Component Value Date/Time   HGBA1C 7.8 (A) 04/12/2020 11:32 AM   HGBA1C 8.5 (H) 12/15/2019 10:41 AM   HGBA1C 8.2 (H) 04/09/2019 12:13 PM   . Pharmacist Clinical Goal(s): o Over the next 30 days, patient will work with PharmD and providers to achieve A1c  goal <7% . Current regimen:  o Rybelsus 7mg  daily o Farxiga 5mg  daily o Glimepiride 4mg  daily . Interventions: o Reviewed home monitoring and A1c o Counseled on importance of eating with doses of glimepiride to avoid low blood sugars o Discussed dietary modifications to promote euglycemia. . Patient self care activities - Over the next 30 days, patient will: o Check blood sugar once daily, document, and provide at future appointments o Contact provider with any episodes of hypoglycemia o Work on diet and exercise to meet goal A1c of < 7%. Initial goal documentation        [name] was given information about Chronic Care Management services today including:  1. CCM service includes personalized support from designated clinical staff supervised by her physician, including individualized plan of care and coordination with other care providers 2. 24/7 contact phone numbers for assistance for urgent and routine care needs. 3. Standard insurance, coinsurance, copays and deductibles apply for chronic care management only during months in which we provide at least 20 minutes of these services. Most insurances cover these services at 100%, however patients may be responsible for any copay, coinsurance and/or deductible if applicable. This service may help you avoid the need for more expensive face-to-face services. 4. Only one practitioner may furnish and bill the service in a calendar month. 5. The patient may stop CCM services at any time (effective at the end of the month) by phone call to the office  staff.  Patient agreed to services and verbal consent obtained.   The patient verbalized understanding of instructions provided today and agreed to receive a mailed copy of patient instruction and/or educational materials. Telephone follow up appointment with pharmacy team member scheduled for: 9/1  Beverly Milch, PharmD Clinical Pharmacist [facility] [phone]   Preventing High Cholesterol Cholesterol is a white, waxy substance similar to fat that the human body needs to help build cells. The liver makes all the cholesterol that a person's body needs. Having high cholesterol (hypercholesterolemia) increases a person's risk for heart disease and stroke. Extra (excess) cholesterol comes from the food the person eats. High cholesterol can often be prevented with diet and lifestyle changes. If you already have high cholesterol, you can control it with diet and lifestyle changes and with medicine. How can high cholesterol affect me? If you have high cholesterol, deposits (plaques) may build up on the walls of your arteries. The arteries are the blood vessels that carry blood away from your heart. Plaques make the arteries narrower and stiffer. This can limit or block blood flow and cause blood clots to form. Blood clots:  Are tiny balls of cells that form in your blood.  Can move to the heart or brain, causing a heart attack or stroke. Plaques in arteries greatly increase your risk for heart attack and stroke.Making diet and lifestyle changes can reduce your risk for these conditions that may threaten your life. What can increase my risk? This condition is more likely to develop in people who:  Eat foods that are high in saturated fat or cholesterol. Saturated fat is mostly found in: ? Foods that contain animal fat, such as red meat and some dairy products. ? Certain fatty foods made from plants, such as tropical oils.  Are overweight.  Are not getting enough exercise.  Have a family history of high cholesterol. What actions can I take to prevent this? Nutrition   Eat less saturated fat.  Avoid trans fats (partially hydrogenated oils). These are often found in margarine and in some baked goods, fried foods, and snacks bought in packages.  Avoid precooked or cured meat, such as sausages or meat loaves.  Avoid foods and drinks that  have added sugars.  Eat more fruits, vegetables, and whole grains.  Choose healthy sources of protein, such as fish, poultry, lean cuts of red meat, beans, peas, lentils, and nuts.  Choose healthy sources of fat, such as: ? Nuts. ? Vegetable oils, especially olive oil. ? Fish that have healthy fats (omega-3 fatty acids), such as mackerel or salmon. The items listed above may not be a complete list of recommended foods and beverages. Contact a dietitian for more information. Lifestyle  Lose weight if you are overweight. Losing 5-10 lb (2.3-4.5 kg) can help prevent or control high cholesterol. It can also lower your risk for diabetes and high blood pressure. Ask your health care provider to help you with a diet and exercise plan to lose weight safely.  Do not use any products that contain nicotine or tobacco, such as cigarettes, e-cigarettes, and chewing tobacco. If you need help quitting, ask your health care provider.  Limit your alcohol intake. ? Do not drink alcohol if:  Your health care provider tells you not to drink.  You are pregnant, may be pregnant, or are planning to become pregnant. ? If you drink alcohol:  Limit how much you use to:  0-1 drink a day for women.  0-2 drinks a day for men.  Be aware of how much alcohol is in your drink. In the [place] one drink equals one 12 oz bottle of beer (355 mL), one 5 oz glass of wine (148 mL), or one 1 oz glass of hard liquor (44 mL). Activity   Get enough exercise. Each week, do at least 150 minutes of exercise that takes a medium level of effort (moderate-intensity exercise). ? This is exercise that:  Makes your heart beat faster and makes you breathe harder than usual.  Allows you to still be able to talk. ? You could exercise in short sessions several times a day or longer sessions a few times a week. For example, on 5 days each week, you could walk fast or ride your bike 3 times a day for 10 minutes each time.  Do  exercises as told by your health care provider. Medicines  In addition to diet and lifestyle changes, your health care provider may recommend medicines to help lower cholesterol. This may be a medicine to lower the amount of cholesterol your liver makes. You may need medicine if: ? Diet and lifestyle changes do not lower your cholesterol enough. ? You have high cholesterol and other risk factors for heart disease or stroke.  Take over-the-counter and prescription medicines only as told by your health care provider. General information  Manage your risk factors for high cholesterol. Talk with your health care provider about all your risk factors and how to lower your risk.  Manage other conditions that you have, such as diabetes or high blood pressure (hypertension).  Have blood tests to check your cholesterol levels at regular points in time as told by your health care provider.  Keep all follow-up visits as told by your health care provider. This is important. Where to find more information  American Heart Association: www.heart.org  National Heart, Lung, and Blood Institute: https://wilson-eaton.com/ Summary  High cholesterol increases your risk for heart disease and stroke. By keeping your cholesterol level low, you can reduce your risk for these conditions.  High cholesterol can often be prevented with diet and lifestyle changes.  Work with your health care provider to manage your risk factors, and have your blood tested regularly. This information is not intended to replace advice given to you by your health care provider. Make sure you discuss any questions you have with your health care provider. Document Revised: 01/01/2019 Document Reviewed: 05/18/2016 Elsevier Patient Education  2020 [employer].

## 2020-04-28 ENCOUNTER — Other Ambulatory Visit: Payer: Self-pay | Admitting: *Deleted

## 2020-04-28 DIAGNOSIS — E782 Mixed hyperlipidemia: Secondary | ICD-10-CM

## 2020-04-28 DIAGNOSIS — E611 Iron deficiency: Secondary | ICD-10-CM

## 2020-04-28 DIAGNOSIS — E663 Overweight: Secondary | ICD-10-CM

## 2020-04-28 DIAGNOSIS — I10 Essential (primary) hypertension: Secondary | ICD-10-CM

## 2020-04-28 DIAGNOSIS — E1165 Type 2 diabetes mellitus with hyperglycemia: Secondary | ICD-10-CM

## 2020-04-28 DIAGNOSIS — K219 Gastro-esophageal reflux disease without esophagitis: Secondary | ICD-10-CM

## 2020-04-28 DIAGNOSIS — R5382 Chronic fatigue, unspecified: Secondary | ICD-10-CM

## 2020-05-16 ENCOUNTER — Telehealth: Payer: Self-pay | Admitting: *Deleted

## 2020-05-16 NOTE — Telephone Encounter (Signed)
Received call from patient daughter.   Reports that patient is having swings with her blood sugars with medication changes from ENDO. Proposed multiple medication changes on VM.   Call placed to patient. [place]. Patient will need to discuss with ENDO as they are currently handling DM.

## 2020-05-18 ENCOUNTER — Encounter: Payer: Self-pay | Admitting: Family Medicine

## 2020-05-18 ENCOUNTER — Other Ambulatory Visit: Payer: Self-pay | Admitting: Family Medicine

## 2020-05-18 NOTE — Telephone Encounter (Signed)
Patient aware per MyChart.   

## 2020-05-22 MED ORDER — METFORMIN HCL 500 MG PO TABS
500.0000 mg | ORAL_TABLET | Freq: Two times a day (BID) | ORAL | 1 refills | Status: DC
Start: 2020-05-22 — End: 2020-07-20

## 2020-05-24 ENCOUNTER — Ambulatory Visit: Payer: Self-pay | Admitting: Pharmacist

## 2020-05-24 DIAGNOSIS — I10 Essential (primary) hypertension: Secondary | ICD-10-CM

## 2020-05-24 DIAGNOSIS — E1165 Type 2 diabetes mellitus with hyperglycemia: Secondary | ICD-10-CM

## 2020-05-24 NOTE — Patient Instructions (Addendum)
Visit Information  Goals Addressed            This Visit's Progress   . Pharmacy Care Plan:       CARE PLAN ENTRY (see longitudinal plan of care for additional care plan information)  Current Barriers:  . Chronic Disease Management support, education, and care coordination needs related to Hypertension, Hyperlipidemia, and Diabetes   Hypertension BP Readings from Last 3 Encounters:  04/17/20 (!) 132/76  04/12/20 130/80  02/28/20 128/74   . Pharmacist Clinical Goal(s): o Over the next 90 days, patient will work with PharmD and providers to maintain BP goal <130/80 . Current regimen:  o Bystolic 20mg  daily . Interventions: o Reviewed most recent office BP o Discussed diet/exercise . Patient self care activities - Over the next 30 days, patient will: o Check BP periodically, document, and provide at future appointments o Ensure daily salt intake < 2300 mg/day o Contact providers with consistent BP > 130/80  Hyperlipidemia Lab Results  Component Value Date/Time   LDLCALC 110 (H) 12/15/2019 10:41 AM   . Pharmacist Clinical Goal(s): o Over the next 90 days, patient will work with PharmD and providers to achieve LDL goal < 70 . Current regimen:  o No medications . Interventions: o Reviewed most recent lipid panel o Discussed history of intolerance to statin . Patient self care activities - Over the next 30 days, patient will: o Focus on diet low in saturated fats and sugars to bring down LDL cholesterol  Diabetes Lab Results  Component Value Date/Time   HGBA1C 7.8 (A) 04/12/2020 11:32 AM   HGBA1C 8.5 (H) 12/15/2019 10:41 AM   HGBA1C 8.2 (H) 04/09/2019 12:13 PM   . Pharmacist Clinical Goal(s): o Over the next 90 days, patient will work with PharmD and providers to achieve A1c goal <7% . Current regimen:  o Metformin 500mg  twice daily o Glimepiride 4mg  daily . Interventions: o Reviewed home monitoring and A1c o Counseled on importance of eating with doses of  glimepiride to avoid low blood sugars o Discussed dietary modifications to promote euglycemia. . Patient self care activities - Over the next 30 days, patient will: o Check blood sugar once daily, document, and provide at future appointments o Contact provider with any episodes of hypoglycemia o Work on diet and exercise to meet goal A1c of < 7%. Please see past updates related to this goal by clicking on the "Past Updates" button in the selected goal         The patient verbalized understanding of instructions provided today and agreed to receive a mailed copy of patient instruction and/or educational materials.  Telephone follow up appointment with pharmacy team member scheduled for: 3 months  Beverly Milch, PharmD Clinical Pharmacist [facility] [phone]   Hyperglycemia Hyperglycemia occurs when the level of sugar (glucose) in the blood is too high. Glucose is a type of sugar that provides the body's main source of energy. Certain hormones (insulin and glucagon) control the level of glucose in the blood. Insulin lowers blood glucose, and glucagon increases blood glucose. Hyperglycemia can result from having too little insulin in the bloodstream, or from the body not responding normally to insulin. Hyperglycemia occurs most often in people who have diabetes (diabetes mellitus), but it can happen in people who do not have diabetes. It can develop quickly, and it can be life-threatening if it causes you to become severely dehydrated (diabetic ketoacidosis or hyperglycemic hyperosmolar state). Severe hyperglycemia is a medical emergency. What  are the causes? If you have diabetes, hyperglycemia may be caused by:  Diabetes medicine.  Medicines that increase blood glucose or affect your diabetes control.  Not eating enough, or not eating often enough.  Changes in physical activity level.  Being sick or having an infection. If you have prediabetes or  undiagnosed diabetes:  Hyperglycemia may be caused by those conditions. If you do not have diabetes, hyperglycemia may be caused by:  Certain medicines, including steroid medicines, beta-blockers, epinephrine, and thiazide diuretics.  Stress.  Serious illness.  Surgery.  Diseases of the pancreas.  Infection. What increases the risk? Hyperglycemia is more likely to develop in people who have risk factors for diabetes, such as:  Having a family member with diabetes.  Having a gene for type 1 diabetes that is passed from parent to child (inherited).  Living in an area with cold weather conditions.  Exposure to certain viruses.  Certain conditions in which the body's disease-fighting (immune) system attacks itself (autoimmune disorders).  Being overweight or obese.  Having an inactive (sedentary) lifestyle.  Having been diagnosed with insulin resistance.  Having a history of prediabetes, gestational diabetes, or polycystic ovarian syndrome (PCOS).  Being of American-Indian, African-American, Hispanic/Latino, or Asian/Pacific Islander descent. What are the signs or symptoms? Hyperglycemia may not cause any symptoms. If you do have symptoms, they may include early warning signs, such as:  Increased thirst.  Hunger.  Feeling very tired.  Needing to urinate more often than usual.  Blurry vision. Other symptoms may develop if hyperglycemia gets worse, such as:  Dry mouth.  Loss of appetite.  Fruity-smelling breath.  Weakness.  Unexpected or rapid weight gain or weight loss.  Tingling or numbness in the hands or feet.  Headache.  Skin that does not quickly return to normal after being lightly pinched and released (poor skin turgor).  Abdominal pain.  Cuts or bruises that are slow to heal. How is this diagnosed? Hyperglycemia is diagnosed with a blood test to measure your blood glucose level. This blood test is usually done while you are having symptoms.  Your health care provider may also do a physical exam and review your medical history. You may have more tests to determine the cause of your hyperglycemia, such as:  A fasting blood glucose (FBG) test. You will not be allowed to eat (you will fast) for at least 8 hours before a blood sample is taken.  An A1c (hemoglobin A1c) blood test. This provides information about blood glucose control over the previous 2-3 months.  An oral glucose tolerance test (OGTT). This measures your blood glucose at two times: ? After fasting. This is your baseline blood glucose level. ? Two hours after drinking a beverage that contains glucose. How is this treated? Treatment depends on the cause of your hyperglycemia. Treatment may include:  Taking medicine to regulate your blood glucose levels. If you take insulin or other diabetes medicines, your medicine or dosage may be adjusted.  Lifestyle changes, such as exercising more, eating healthier foods, or losing weight.  Treating an illness or infection, if this caused your hyperglycemia.  Checking your blood glucose more often.  Stopping or reducing steroid medicines, if these caused your hyperglycemia. If your hyperglycemia becomes severe and it results in hyperglycemic hyperosmolar state, you must be hospitalized and given IV fluids. Follow these instructions at home:  General instructions  Take over-the-counter and prescription medicines only as told by your health care provider.  Do not use any products that contain  nicotine or tobacco, such as cigarettes and e-cigarettes. If you need help quitting, ask your health care provider.  Limit alcohol intake to no more than 1 drink per day for nonpregnant women and 2 drinks per day for men. One drink equals 12 oz of beer, 5 oz of wine, or 1 oz of hard liquor.  Learn to manage stress. If you need help with this, ask your health care provider.  Keep all follow-up visits as told by your health care  provider. This is important. Eating and drinking   Maintain a healthy weight.  Exercise regularly, as directed by your health care provider.  Stay hydrated, especially when you exercise, get sick, or spend time in hot temperatures.  Eat healthy foods, such as: ? Lean proteins. ? Complex carbohydrates. ? Fresh fruits and vegetables. ? Low-fat dairy products. ? Healthy fats.  Drink enough fluid to keep your urine clear or pale yellow. If you have diabetes:  Make sure you know the symptoms of hyperglycemia.  Follow your diabetes management plan, as told by your health care provider. Make sure you: ? Take your insulin and medicines as directed. ? Follow your exercise plan. ? Follow your meal plan. Eat on time, and do not skip meals. ? Check your blood glucose as often as directed. Make sure to check your blood glucose before and after exercise. If you exercise longer or in a different way than usual, check your blood glucose more often. ? Follow your sick day plan whenever you cannot eat or drink normally. Make this plan in advance with your health care provider.  Share your diabetes management plan with people in your workplace, school, and household.  Check your urine for ketones when you are ill and as told by your health care provider.  Carry a medical alert card or wear medical alert jewelry. Contact a health care provider if:  Your blood glucose is at or above 240 mg/dL (13.3 mmol/L) for 2 days in a row.  You have problems keeping your blood glucose in your target range.  You have frequent episodes of hyperglycemia. Get help right away if:  You have difficulty breathing.  You have a change in how you think, feel, or act (mental status).  You have nausea or vomiting that does not go away. These symptoms may represent a serious problem that is an emergency. Do not wait to see if the symptoms will go away. Get medical help right away. Call your local emergency services  (911 in the [place] Do not drive yourself to the hospital. Summary  Hyperglycemia occurs when the level of sugar (glucose) in the blood is too high.  Hyperglycemia is diagnosed with a blood test to measure your blood glucose level. This blood test is usually done while you are having symptoms. Your health care provider may also do a physical exam and review your medical history.  If you have diabetes, follow your diabetes management plan as told by your health care provider.  Contact your health care provider if you have problems keeping your blood glucose in your target range. This information is not intended to replace advice given to you by your health care provider. Make sure you discuss any questions you have with your health care provider. Document Revised: 05/27/2016 Document Reviewed: 05/27/2016 Elsevier Patient Education  [place].

## 2020-05-24 NOTE — Chronic Care Management (AMB) (Signed)
Chronic Care Management   Follow Up Note   05/24/2020 Name: NEFTALI Brown MRN: [id number] DOB: Sep 01, 1946  Referred by: Alycia Rossetti, MD Reason for referral : Chronic Care Management (PharmD follow up)   [name] is a 74 y.o. year old female who is a primary care patient of [place], [place] Nunnery, MD. The CCM team was consulted for assistance with chronic disease management and care coordination needs.    Review of patient status, including review of consultants reports, relevant laboratory and other test results, and collaboration with appropriate care team members and the patient's provider was performed as part of comprehensive patient evaluation and provision of chronic care management services.    SDOH (Social Determinants of Health) assessments performed: No See Care Plan activities for detailed interventions related to [facility])     Outpatient Encounter Medications as of 05/24/2020  Medication Sig  . acetaminophen (TYLENOL) 325 MG tablet Take 325 mg by mouth every 6 (six) hours as needed for mild pain.   . Blood Glucose Monitoring Suppl (BLOOD GLUCOSE SYSTEM PAK) KIT Use as directed to monitor FSBS 1x daily. Dx: I95.1  . BYSTOLIC 20 MG TABS TAKE 1 TABLET BY MOUTH TWICE DAILY.  Marland Kitchen Cholecalciferol (VITAMIN D3 PO) Take 1 tablet by mouth daily.  Marland Kitchen CINNAMON PO Take 1 capsule by mouth. Takes sometimes  . famotidine (PEPCID) 20 MG tablet TAKE ONE TABLET BY MOUTH TWICE DAILY.  . furosemide (LASIX) 40 MG tablet Take 1 tablet (40 mg total) by mouth every other day. (Patient taking differently: Take 40 mg by mouth as needed. )  . glimepiride (AMARYL) 4 MG tablet TAKE 1 TABLET BY MOUTH TWICE DAILY.  Marland Kitchen glucose blood (ACCU-CHEK GUIDE) test strip 1 each by Other route as needed for other. Use as instructed to check blood sugar daily.  Marland Kitchen LANCETS SUPER THIN 28G MISC Use to check blood sugar once daily.  . metFORMIN (GLUCOPHAGE) 500 MG tablet Take 1 tablet (500 mg total) by mouth 2 (two) times  daily with a meal.  . Multiple Vitamin (MULTIVITAMIN) tablet Take 1 tablet by mouth daily.  . potassium chloride (K-DUR) 10 MEQ tablet Take 1 tablet (10 mEq total) by mouth every other day. (Patient taking differently: Take 10 mEq by mouth every other day. )  . tetrahydrozoline-zinc (VISINE-AC) 0.05-0.25 % ophthalmic solution Place 2 drops into both eyes 3 (three) times daily as needed (dry eyes).   No facility-administered encounter medications on file as of 05/24/2020.     Goals Addressed            This Visit's Progress   . Pharmacy Care Plan:       CARE PLAN ENTRY (see longitudinal plan of care for additional care plan information)  Current Barriers:  . Chronic Disease Management support, education, and care coordination needs related to Hypertension, Hyperlipidemia, and Diabetes   Hypertension BP Readings from Last 3 Encounters:  04/17/20 (!) 132/76  04/12/20 130/80  02/28/20 128/74   . Pharmacist Clinical Goal(s): o Over the next 90 days, patient will work with PharmD and providers to maintain BP goal <130/80 . Current regimen:  o Bystolic 88CZ daily . Interventions: o Reviewed most recent office BP o Discussed diet/exercise . Patient self care activities - Over the next 30 days, patient will: o Check BP periodically, document, and provide at future appointments o Ensure daily salt intake < 2300 mg/day o Contact providers with consistent BP > 130/80  Hyperlipidemia Lab Results  Component Value Date/Time  [place] 110 (H) 12/15/2019 10:41 AM   . Pharmacist Clinical Goal(s): o Over the next 90 days, patient will work with PharmD and providers to achieve LDL goal < 70 . Current regimen:  o No medications . Interventions: o Reviewed most recent lipid panel o Discussed history of intolerance to statin . Patient self care activities - Over the next 30 days, patient will: o Focus on diet low in saturated fats and sugars to bring down LDL cholesterol  Diabetes Lab  Results  Component Value Date/Time   HGBA1C 7.8 (A) 04/12/2020 11:32 AM   HGBA1C 8.5 (H) 12/15/2019 10:41 AM   HGBA1C 8.2 (H) 04/09/2019 12:13 PM   . Pharmacist Clinical Goal(s): o Over the next 90 days, patient will work with PharmD and providers to achieve A1c goal <7% . Current regimen:  o Metformin $RemoveBef'500mg'yELbofpZuo$  twice daily o Glimepiride $RemoveBefor'4mg'DkevZrmvnTKf$  daily . Interventions: o Reviewed home monitoring and A1c o Counseled on importance of eating with doses of glimepiride to avoid low blood sugars o Discussed dietary modifications to promote euglycemia. . Patient self care activities - Over the next 30 days, patient will: o Check blood sugar once daily, document, and provide at future appointments o Contact provider with any episodes of hypoglycemia o Work on diet and exercise to meet goal A1c of < 7%. Initial goal documentation       Diabetes   A1c goal <7%  Recent Relevant Labs: Lab Results  Component Value Date/Time   HGBA1C 7.8 (A) 04/12/2020 11:32 AM   HGBA1C 8.5 (H) 12/15/2019 10:41 AM   HGBA1C 8.2 (H) 04/09/2019 12:13 PM   GFR 55.96 (L) 02/23/2020 11:15 AM   GFR 63.76 12/04/2018 10:09 AM   MICROALBUR 7.9 (H) 02/23/2020 11:15 AM   MICROALBUR 11.8 (H) 03/09/2018 08:42 AM    Last diabetic Eye exam:  Lab Results  Component Value Date/Time   HMDIABEYEEXA No Retinopathy 03/24/2018 12:00 AM    Last diabetic Foot exam:  Lab Results  Component Value Date/Time   HMDIABFOOTEX yes  07/23/2010 12:00 AM     Checking BG: Daily  She just started this regimen yesterday and has not checked her blood sugar.  Patient has failed these meds in past: Farxiga, Rybelsus (made her sick) Patient is currently uncontrolled on the following medications: Marland Kitchen Metformin $RemoveBeforeDEI'500mg'EHkyGDRkvVxGtJTs$  bid . Glimepiride $RemoveBefor'4mg'wBPXEMPyLzmr$  bid  She reports that she discontinued [place] and Rybelsus because they made her feel bad.  Requested current med regimen. States she already feels better. Has taken one dose of metformin currently with no GI  issues.  Had not checked sugar yet this am.  Discussed importance of diet and exercise along with medication for DM.  Will f/u with Dr. Buelah Manis in November, recommend updated A1c.    Plan  Continue current medications, contact me if GI issues with metformin.  F/u with Dr. Buelah Manis in Nov. For updated A1c.    Hypertension    Office blood pressures are  BP Readings from Last 3 Encounters:  04/17/20 (!) 132/76  04/12/20 130/80  02/28/20 128/74   Patient checks BP at home infrequently  Patient is currently controlled on the following medications:  . Bystolic $RemoveBe'20mg'SvYKDbhED$  daily  Office BP has been well controlled.  No adverse effects with beta blocker.  No complaints of copay at this time.  Plan  Continue current medications     Beverly Milch, PharmD Clinical Pharmacist [facility] [phone]

## 2020-05-24 NOTE — Progress Notes (Deleted)
Chronic Care Management   Follow Up Note   05/24/2020 Name: [name] MRN: [id number] DOB: Sep 01, 1946  Referred by: Nicole Rossetti, MD Reason for referral : Chronic Care Management (PharmD follow up)   [name] is a 74 y.o. year old female who is a primary care patient of [name], Nicole Nunnery, MD. The CCM team was consulted for assistance with chronic disease management and care coordination needs.    Review of patient status, including review of consultants reports, relevant laboratory and other test results, and collaboration with appropriate care team members and the patient's provider was performed as part of comprehensive patient evaluation and provision of chronic care management services.    SDOH (Social Determinants of Health) assessments performed: No See Care Plan activities for detailed interventions related to [name])     Outpatient Encounter Medications as of 05/24/2020  Medication Sig  . acetaminophen (TYLENOL) 325 MG tablet Take 325 mg by mouth every 6 (six) hours as needed for mild pain.   . Blood Glucose Monitoring Suppl (BLOOD GLUCOSE SYSTEM PAK) KIT Use as directed to monitor FSBS 1x daily. Dx: I95.1  . BYSTOLIC 20 MG TABS TAKE 1 TABLET BY MOUTH TWICE DAILY.  Marland Kitchen Cholecalciferol (VITAMIN D3 PO) Take 1 tablet by mouth daily.  Marland Kitchen CINNAMON PO Take 1 capsule by mouth. Takes sometimes  . famotidine (PEPCID) 20 MG tablet TAKE ONE TABLET BY MOUTH TWICE DAILY.  . furosemide (LASIX) 40 MG tablet Take 1 tablet (40 mg total) by mouth every other day. (Patient taking differently: Take 40 mg by mouth as needed. )  . glimepiride (AMARYL) 4 MG tablet TAKE 1 TABLET BY MOUTH TWICE DAILY.  Marland Kitchen glucose blood (ACCU-CHEK GUIDE) test strip 1 each by Other route as needed for other. Use as instructed to check blood sugar daily.  Marland Kitchen LANCETS SUPER THIN 28G MISC Use to check blood sugar once daily.  . metFORMIN (GLUCOPHAGE) 500 MG tablet Take 1 tablet (500 mg total) by mouth 2 (two) times  daily with a meal.  . Multiple Vitamin (MULTIVITAMIN) tablet Take 1 tablet by mouth daily.  . potassium chloride (K-DUR) 10 MEQ tablet Take 1 tablet (10 mEq total) by mouth every other day. (Patient taking differently: Take 10 mEq by mouth every other day. )  . tetrahydrozoline-zinc (VISINE-AC) 0.05-0.25 % ophthalmic solution Place 2 drops into both eyes 3 (three) times daily as needed (dry eyes).   No facility-administered encounter medications on file as of 05/24/2020.     Goals Addressed            This Visit's Progress   . Pharmacy Care Plan:       CARE PLAN ENTRY (see longitudinal plan of care for additional care plan information)  Current Barriers:  . Chronic Disease Management support, education, and care coordination needs related to Hypertension, Hyperlipidemia, and Diabetes   Hypertension BP Readings from Last 3 Encounters:  04/17/20 (!) 132/76  04/12/20 130/80  02/28/20 128/74   . Pharmacist Clinical Goal(s): o Over the next 90 days, patient will work with PharmD and providers to maintain BP goal <130/80 . Current regimen:  o Bystolic 88CZ daily . Interventions: o Reviewed most recent office BP o Discussed diet/exercise . Patient self care activities - Over the next 30 days, patient will: o Check BP periodically, document, and provide at future appointments o Ensure daily salt intake < 2300 mg/day o Contact providers with consistent BP > 130/80  Hyperlipidemia Lab Results  Component Value Date/Time  [place] 110 (H) 12/15/2019 10:41 AM   . Pharmacist Clinical Goal(s): o Over the next 90 days, patient will work with PharmD and providers to achieve LDL goal < 70 . Current regimen:  o No medications . Interventions: o Reviewed most recent lipid panel o Discussed history of intolerance to statin . Patient self care activities - Over the next 30 days, patient will: o Focus on diet low in saturated fats and sugars to bring down LDL cholesterol  Diabetes Lab  Results  Component Value Date/Time   HGBA1C 7.8 (A) 04/12/2020 11:32 AM   HGBA1C 8.5 (H) 12/15/2019 10:41 AM   HGBA1C 8.2 (H) 04/09/2019 12:13 PM   . Pharmacist Clinical Goal(s): o Over the next 90 days, patient will work with PharmD and providers to achieve A1c goal <7% . Current regimen:  o Metformin 538m twice daily o Glimepiride 43mdaily . Interventions: o Reviewed home monitoring and A1c o Counseled on importance of eating with doses of glimepiride to avoid low blood sugars o Discussed dietary modifications to promote euglycemia. . Patient self care activities - Over the next 30 days, patient will: o Check blood sugar once daily, document, and provide at future appointments o Contact provider with any episodes of hypoglycemia o Work on diet and exercise to meet goal A1c of < 7%. Initial goal documentation       Diabetes   A1c goal <7%  Recent Relevant Labs: Lab Results  Component Value Date/Time   HGBA1C 7.8 (A) 04/12/2020 11:32 AM   HGBA1C 8.5 (H) 12/15/2019 10:41 AM   HGBA1C 8.2 (H) 04/09/2019 12:13 PM   GFR 55.96 (L) 02/23/2020 11:15 AM   GFR 63.76 12/04/2018 10:09 AM   MICROALBUR 7.9 (H) 02/23/2020 11:15 AM   MICROALBUR 11.8 (H) 03/09/2018 08:42 AM    Last diabetic Eye exam:  Lab Results  Component Value Date/Time   HMDIABEYEEXA No Retinopathy 03/24/2018 12:00 AM    Last diabetic Foot exam:  Lab Results  Component Value Date/Time   HMDIABFOOTEX yes  07/23/2010 12:00 AM     Checking BG: Daily  She just started this regimen yesterday and has not checked her blood sugar.  Patient has failed these meds in past: Farxiga, Rybelsus (made her sick) Patient is currently uncontrolled on the following medications: . Marland Kitchenetformin 50024mid . Glimepiride 4mg46md  She reports that she discontinued FarxIran Rybelsus because they made her feel bad.  Requested current med regimen. States she already feels better. Has taken one dose of metformin currently with no GI  issues.  Had not checked sugar yet this am.  Discussed importance of diet and exercise along with medication for DM.  Will f/u with Dr. DurhBuelah ManisNovember, recommend updated A1c.    Plan  Continue current medications, contact me if GI issues with metformin.  F/u with Dr. DurhBuelah ManisNov. For updated A1c.    Hypertension    Office blood pressures are  BP Readings from Last 3 Encounters:  04/17/20 (!) 132/76  04/12/20 130/80  02/28/20 128/74   Patient checks BP at home infrequently  Patient is currently {CHL Controlled/Uncontrolled:[phone]} on the following medications:  . Bystolic 20mg67YPly  Office BP has been well controlled.  No adverse effects with beta blocker.  No complaints of copay at this time.  Plan  Continue current medications     ChriBeverly MilcharmD Clinical Pharmacist [facility] [phone]

## 2020-06-02 ENCOUNTER — Encounter: Payer: Self-pay | Admitting: Internal Medicine

## 2020-06-12 ENCOUNTER — Ambulatory Visit: Payer: Medicare Other | Admitting: Endocrinology

## 2020-07-07 ENCOUNTER — Other Ambulatory Visit: Payer: Self-pay | Admitting: Internal Medicine

## 2020-07-19 ENCOUNTER — Telehealth: Payer: Self-pay | Admitting: Pharmacist

## 2020-07-19 NOTE — Progress Notes (Signed)
Chronic Care Management Pharmacy Assistant   Name: TONY FRISCIA  MRN: [id number] DOB: Sep 16, 1946  Reason for Encounter: Disease State  Patient Questions:  1.  Have you seen any other providers since your last visit? No  2.  Any changes in your medicines or health? No    PCP : Alycia Rossetti, MD   Their chronic conditions include: hypertension, IBS, Type II DM with neuropathy, DDD, hyperlipidemia.  Office Visits: None since their last CCM visit with the clinical pharmacist on 05-24-2020.  Consults: None since their last CCM visit with the clinical pharmacist on 05-24-2020.  Allergies:   Allergies  Allergen Reactions   Demerol [Meperidine] Other (See Comments)    Lost consciousness   Bentyl [Dicyclomine Hcl] Swelling   Invokana [Canagliflozin] Hives   Metformin And Related Diarrhea   Protonix [Pantoprazole Sodium] Swelling   Statins Other (See Comments)    myalgia   Ace Inhibitors Cough    Medications: Outpatient Encounter Medications as of 07/19/2020  Medication Sig   acetaminophen (TYLENOL) 325 MG tablet Take 325 mg by mouth every 6 (six) hours as needed for mild pain.    Blood Glucose Monitoring Suppl (BLOOD GLUCOSE SYSTEM PAK) KIT Use as directed to monitor FSBS 1x daily. Dx: V25.3   BYSTOLIC 20 MG TABS TAKE 1 TABLET BY MOUTH TWICE DAILY.   Cholecalciferol (VITAMIN D3 PO) Take 1 tablet by mouth daily.   CINNAMON PO Take 1 capsule by mouth. Takes sometimes   famotidine (PEPCID) 20 MG tablet TAKE ONE TABLET BY MOUTH TWICE DAILY.   furosemide (LASIX) 40 MG tablet Take 1 tablet (40 mg total) by mouth every other day. (Patient taking differently: Take 40 mg by mouth as needed. )   glimepiride (AMARYL) 4 MG tablet TAKE 1 TABLET BY MOUTH TWICE DAILY.   glucose blood (ACCU-CHEK GUIDE) test strip 1 each by Other route as needed for other. Use as instructed to check blood sugar daily.   LANCETS SUPER THIN 28G MISC Use to check blood sugar once daily.    metFORMIN (GLUCOPHAGE) 500 MG tablet Take 1 tablet (500 mg total) by mouth 2 (two) times daily with a meal.   Multiple Vitamin (MULTIVITAMIN) tablet Take 1 tablet by mouth daily.   potassium chloride (K-DUR) 10 MEQ tablet Take 1 tablet (10 mEq total) by mouth every other day. (Patient taking differently: Take 10 mEq by mouth every other day. )   tetrahydrozoline-zinc (VISINE-AC) 0.05-0.25 % ophthalmic solution Place 2 drops into both eyes 3 (three) times daily as needed (dry eyes).   No facility-administered encounter medications on file as of 07/19/2020.    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   Thyroid nodule 01/16/2018   Chronic sore throat 01/16/2018   Overweight (BMI 25.0-29.9) 12/18/2017   Diabetic neuropathy ([place]) 10/13/2017   Genetic testing 11/28/2016   Esophageal dysphagia 09/30/2016   Ventral hernia 07/28/2015   Situational anxiety 04/26/2015   Thyromegaly 01/23/2015   Rash and nonspecific skin eruption 01/23/2015   Iron deficiency 06/17/2014   Nephrolithiasis 04/13/2014   Kidney stone 03/18/2014   DDD (degenerative disc disease), lumbar 01/26/2014   Bulge of lumbar disc without myelopathy 01/26/2014   Renal cyst 01/26/2014   Back pain with left-sided sciatica 12/28/2013   Uncontrolled type 2 diabetes mellitus with hyperglycemia, without long-term current use of insulin ([place]) 08/01/2013   Chronic fatigue 12/26/2009   GASTROPARESIS 12/15/2008   Esophageal reflux 06/21/2008  IBS (irritable bowel syndrome) 06/21/2008   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 ([place]) 02/09/2008   Hyperlipidemia 02/09/2008   Essential hypertension 02/09/2008   Insomnia 02/09/2008    Goals Addressed   None    Recent Relevant Labs: Lab Results  Component Value Date/Time   HGBA1C 7.8 (A) 04/12/2020 11:32 AM   HGBA1C 8.5 (H) 12/15/2019 10:41 AM   HGBA1C 8.2 (H) 04/09/2019 12:13 PM    MICROALBUR 7.9 (H) 02/23/2020 11:15 AM   MICROALBUR 11.8 (H) 03/09/2018 08:42 AM    Kidney Function Lab Results  Component Value Date/Time   CREATININE 1.15 02/23/2020 11:15 AM   CREATININE 1.07 (H) 12/15/2019 10:41 AM   CREATININE 1.08 (H) 04/09/2019 12:13 PM   GFR 55.96 (L) 02/23/2020 11:15 AM   GFRNONAA >60 11/20/2018 12:29 PM   MWNUUVOZ 36 09/07/2013 11:04 AM   GFRAA >60 11/20/2018 12:29 PM   GFRAA 78 09/07/2013 11:04 AM     Current antihyperglycemic regimen:  ? Rybelsus 73m daily ? Farxiga 565mdaily ? Glimepiride 50m70maily   What recent interventions/DTPs have been made to improve glycemic control:  o None at this time   Have there been any recent hospitalizations or ED visits since last visit with CPP? No    Patient denies hypoglycemic symptoms, including None    Patient denies hyperglycemic symptoms, including none    How often are you checking your blood sugar? Patient reports not checking her blood sugar at home. She stated she has not been been feeling well enough to get back into the routine of checking her sugars. When I inquired the symptoms she had been experiencing the patient stated she was in the middle of something with a guest and she was unable to continue our conversation. When I offered to call her back at a better time she declined and said she would call me.   What are your blood sugars ranging? Unsure of what the patient's sugars are ranging    During the week, how often does your blood glucose drop below 70? Unknown   Are you checking your feet daily/regularly?  Unknown  Adherence Review: Is the patient currently on a STATIN medication? No Is the patient currently on ACE/ARB medication? No Does the patient have >5 day gap between last estimated fill dates? No CPP please confirm   Follow-Up:  Pharmacist Review   IveFanny SkatesMAWind Ridgearmacist Assistant [phone]

## 2020-07-20 ENCOUNTER — Other Ambulatory Visit: Payer: Self-pay | Admitting: Family Medicine

## 2020-07-23 IMAGING — DX DG ABDOMEN 2V
2 series · 2 of 2 positions shown · non-contrast
Comparison: CT 12/30/2019

CLINICAL DATA: Chronic diarrhea and abdominal pain.

EXAM:
ABDOMEN - 2 VIEW

[abdomen erect]
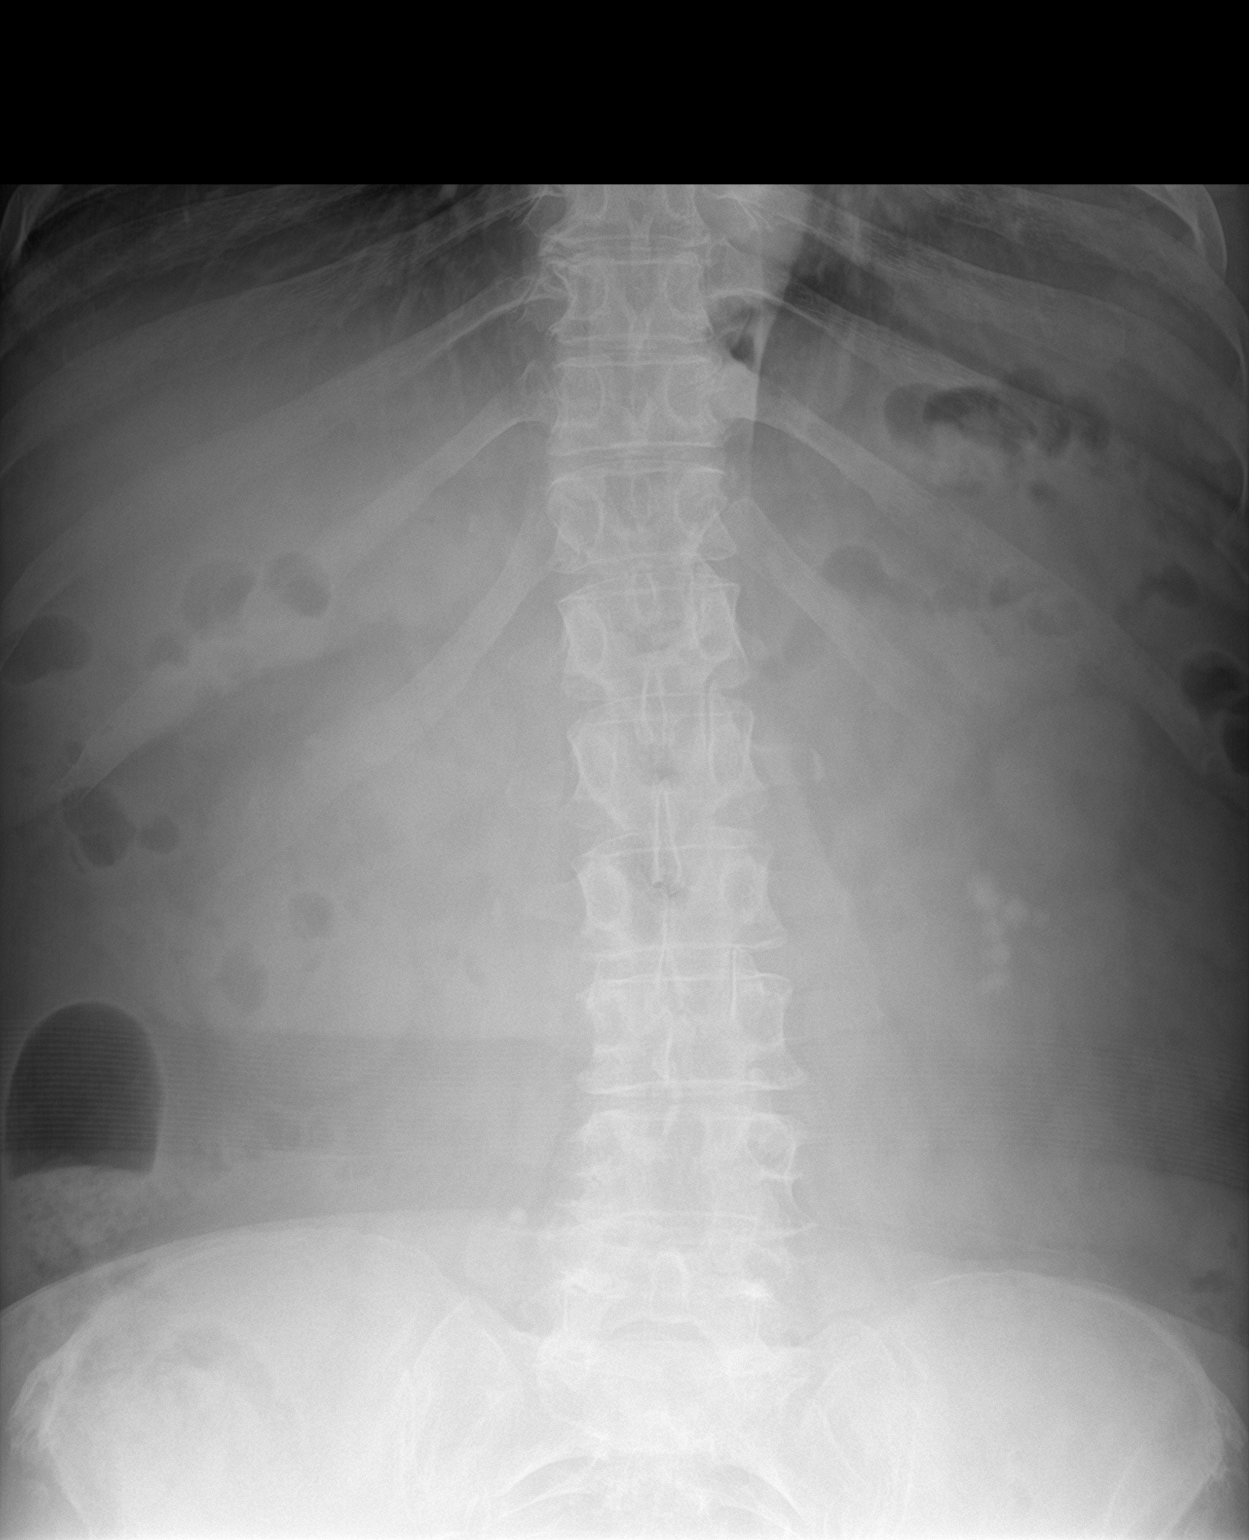

[abdomen supine]
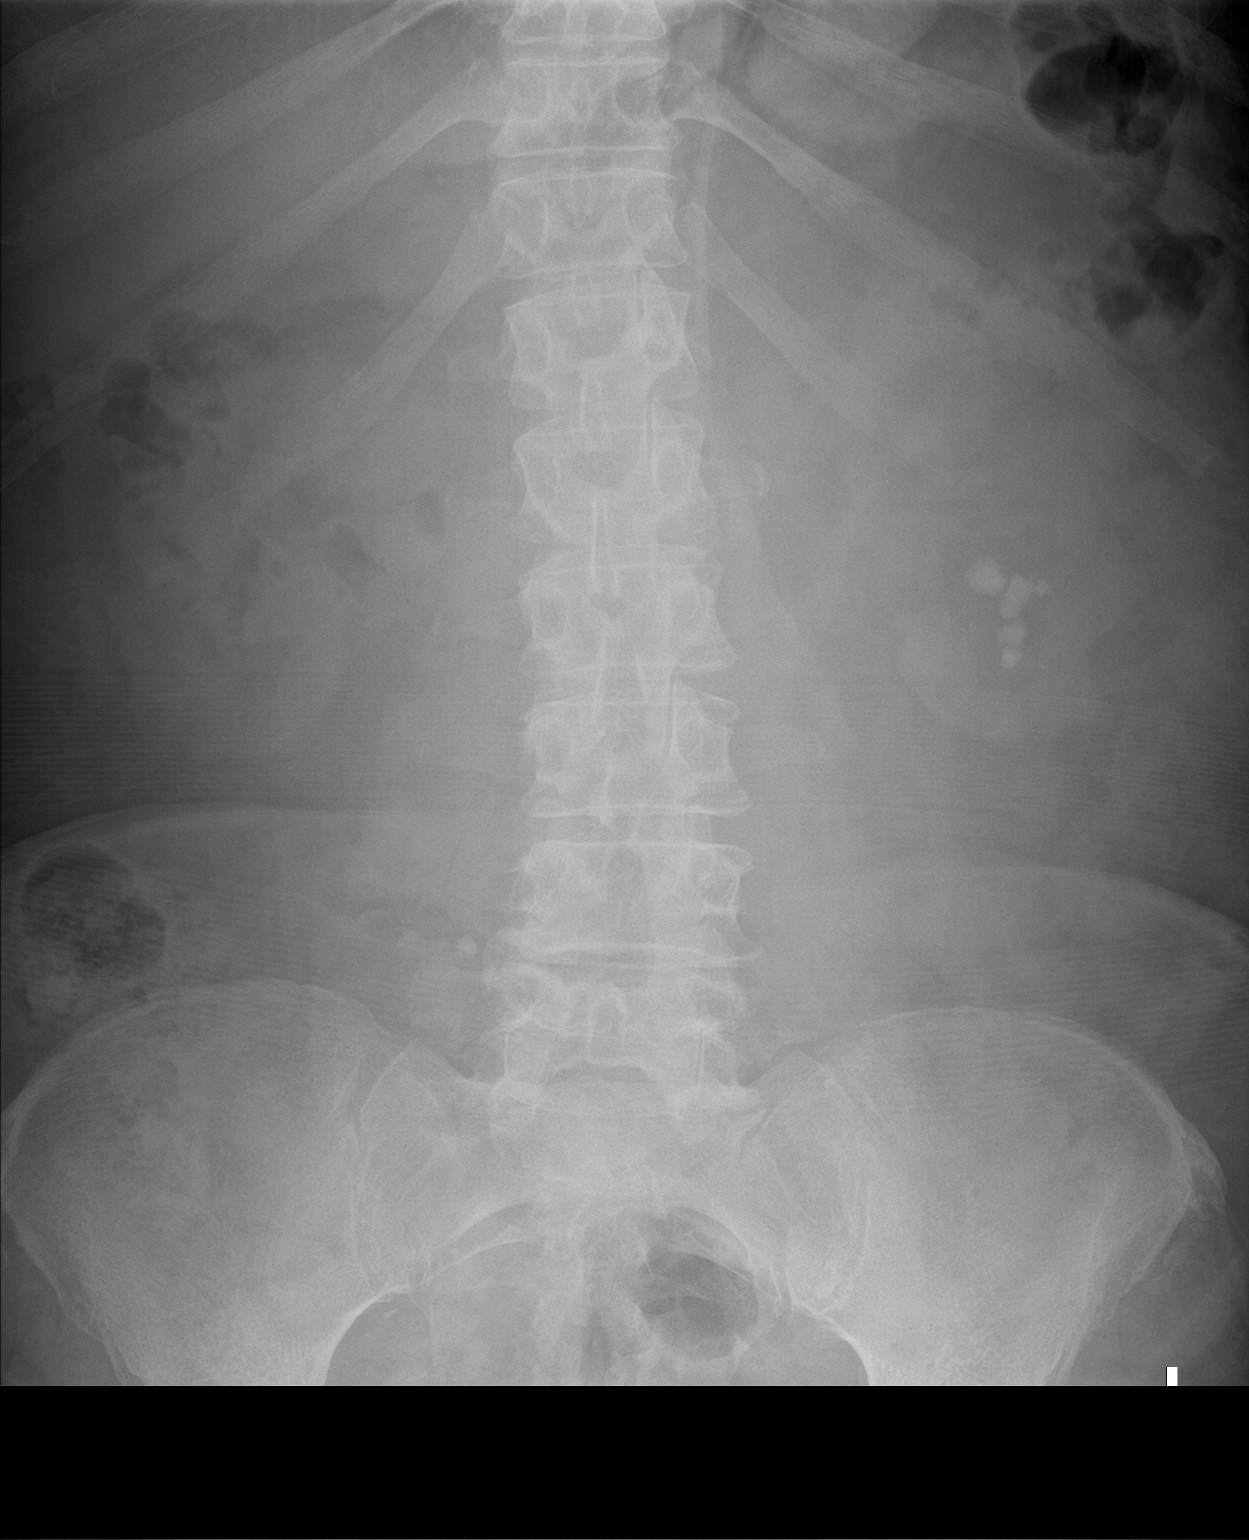

[2 of 2 positions shown; findings below may reference images not displayed]

FINDINGS: Bowel gas pattern is normal without evidence of ileus, obstruction
or free air. Numerous left renal calculi as seen previously.
Phlebolith on the right at the L4-5 disc level.
IMPRESSION: No acute radiographic finding.  Bowel gas pattern is normal.

Numerous stones in the lower pole of left kidney as seen on the
previous CT. Phlebolith on the right at the level of the L4-5 disc.

## 2020-07-26 ENCOUNTER — Other Ambulatory Visit: Payer: Self-pay | Admitting: Endocrinology

## 2020-08-07 ENCOUNTER — Other Ambulatory Visit: Payer: Self-pay

## 2020-08-07 ENCOUNTER — Ambulatory Visit: Payer: Medicare Other | Admitting: Internal Medicine

## 2020-08-07 ENCOUNTER — Encounter: Payer: Self-pay | Admitting: Internal Medicine

## 2020-08-07 VITALS — BP 158/80 | HR 79 | Ht 66.5 in | Wt 184.0 lb

## 2020-08-07 DIAGNOSIS — E782 Mixed hyperlipidemia: Secondary | ICD-10-CM

## 2020-08-07 LAB — LIPID PANEL
Chol/HDL Ratio: 3 ratio (ref 0.0–4.4)
Cholesterol, Total: 212 mg/dL — ABNORMAL HIGH (ref 100–199)
HDL: 71 mg/dL
LDL Chol Calc (NIH): 115 mg/dL — ABNORMAL HIGH (ref 0–99)
Triglycerides: 150 mg/dL — ABNORMAL HIGH (ref 0–149)
VLDL Cholesterol Cal: 26 mg/dL (ref 5–40)

## 2020-08-07 MED ORDER — NEBIVOLOL HCL 20 MG PO TABS: 1.0000 | ORAL_TABLET | Freq: Two times a day (BID) | ORAL | 5 refills | Status: DC

## 2020-08-07 NOTE — Progress Notes (Signed)
Cardiology Office Note   Date:  08/07/2020   ID:  Nicole, Brown [birth year], MRN [id number]  PCP:  Alycia Rossetti, MD  Cardiologist:   Dorris Carnes, MD   F/U of CP      History of Present Illness: [name] is a 74 y.o. female with a history of dyspnea, hypertension, hyperlipidemia, diabetes, irritable bowel.  Stress Myoview in 2013 was normal.  I saw her in clinic back in 2019.  At that time she was complaining of some shortness of breath.  I recommended scheduling a Myoview and echocardiogram.  Myoview was normal.  No evidence for ischemia.  Echo showed normal LVEF there was mild relaxation abnormality and some suggestion of increased filling pressure.  Labs were  checked she is on Lasix now 40 every other day  The pt seen in 2019  Diabetic doctor placed on meds   She felt bad    She is feeilng better now.  She says the med tore up her stomach Pt notes occasional SOB   She also  has some CP with that ratdies to R arm   Pepcid helps    Has reflux     Current Meds  Medication Sig  . acetaminophen (TYLENOL) 325 MG tablet Take 325 mg by mouth every 6 (six) hours as needed for mild pain.   . Blood Glucose Monitoring Suppl (BLOOD GLUCOSE SYSTEM PAK) KIT Use as directed to monitor FSBS 1x daily. Dx: X61.4  . BYSTOLIC 20 MG TABS TAKE 1 TABLET BY MOUTH TWICE DAILY.  Marland Kitchen Cholecalciferol (VITAMIN D3 PO) Take 1 tablet by mouth daily.  Marland Kitchen CINNAMON PO Take 1 capsule by mouth. Takes sometimes  . famotidine (PEPCID) 20 MG tablet TAKE ONE TABLET BY MOUTH TWICE DAILY.  . furosemide (LASIX) 40 MG tablet Take 1 tablet (40 mg total) by mouth every other day. (Patient taking differently: Take 40 mg by mouth as needed. )  . glimepiride (AMARYL) 4 MG tablet 1 tablet twice daily, please make follow-up appointment  . glucose blood (ACCU-CHEK GUIDE) test strip 1 each by Other route as needed for other. Use as instructed to check blood sugar daily.  Marland Kitchen LANCETS SUPER THIN 28G MISC Use to check blood  sugar once daily.  . metFORMIN (GLUCOPHAGE) 500 MG tablet TAKE ONE TABLET BY MOUTH 2 TIMES A DAY WITH A MEAL  . Multiple Vitamin (MULTIVITAMIN) tablet Take 1 tablet by mouth daily.  . potassium chloride (K-DUR) 10 MEQ tablet Take 1 tablet (10 mEq total) by mouth every other day. (Patient taking differently: Take 10 mEq by mouth every other day. )  . tetrahydrozoline-zinc (VISINE-AC) 0.05-0.25 % ophthalmic solution Place 2 drops into both eyes 3 (three) times daily as needed (dry eyes).     Allergies:   Demerol [meperidine], Bentyl [dicyclomine hcl], Invokana [canagliflozin], Metformin and related, Protonix [pantoprazole sodium], Statins, and Ace inhibitors   Past Medical History:  Diagnosis Date  . Abdominal hernia 02/26/12   unrepaired  . 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 Anemia   . Anxiety   . Aortic atherosclerosis ([place])   . Arthritis   . Blood transfusion 1980's  . Breast cancer ([place])   . Complication of anesthesia    has a hard time waking up; can't lay flat  . Degenerative disc disease, lumbar    pressing on L3 and L4  . Diabetes mellitus ([place]) 1989   Type  2 NIDDM; "cancer treatment gave me diabetes"  . Diverticulosis   . DJD (degenerative joint disease) of lumbar spine   . Dysrhythmia    "irregular"  . Exertional dyspnea   . Gastroparesis   . GERD (gastroesophageal reflux disease)    mann  . Heart murmur    "think I outgrew it"  . Hepatic steatosis   . History of kidney stones   . History of stomach ulcers   . Hx: UTI (urinary tract infection)   . Hyperlipidemia   . Hypersensitivity    in tongue  . Hypertension   . IBS (irritable bowel syndrome)   . IBS (irritable bowel syndrome)   . Insomnia   . Internal hemorrhoids   . Kidney cysts    RT KIDNEY  . Kidney stones 2009   s/p lithotripsy  . Obesity   . PONV (postoperative nausea and vomiting)   . Pulmonary nodule   . Shortness of breath    unable to lie flat  .  Ventral hernia     Past Surgical History:  Procedure Laterality Date  . ABDOMINAL HYSTERECTOMY  2002  . APPENDECTOMY    . BACK SURGERY     Can't take any medical procedure in right arm  . BREAST BIOPSY     right  . BREAST SURGERY N/A    Phreesia 04/22/2020  . CATARACT EXTRACTION W/ INTRAOCULAR LENS  IMPLANT, BILATERAL  2009-2010  . CESAREAN SECTION  1973; 1978; 1981  . CESAREAN SECTION N/A    [employer]reesia 04/22/2020  . COLONOSCOPY WITH PROPOFOL N/A 10/29/2016   Procedure: COLONOSCOPY WITH PROPOFOL;  Surgeon: Danie Binder, MD;  [facility];  Service: Endoscopy;  Laterality: N/A;  10:00 am  . CYSTOSCOPY Left 04/13/2014   Procedure: CYSTOSCOPY FLEXIBLE;  Surgeon: Ardis Hughs, MD;  Location: [facility];  Service: Urology;  Laterality: Left;  with STENT  . ESOPHAGOGASTRODUODENOSCOPY (EGD) WITH PROPOFOL N/A 10/29/2016   Procedure: ESOPHAGOGASTRODUODENOSCOPY (EGD) WITH PROPOFOL;  Surgeon: Danie Binder, MD;  [facility];  Service: Endoscopy;  Laterality: N/A;  . EYE SURGERY    . implant and screws put in the right lower jaw  11/2008   dental surgery  . kidney blockage  ?1990's   " major surgery ;put kidney on pump for awhile"  . LITHOTRIPSY     "several times"  . LUMBAR FUSION  08/2010  . MASTECTOMY  1997   right  . NEPHROLITHOTOMY Left 04/13/2014   Procedure: LEFT PERCUTANEOUS NEPHROLITHOTOMY WITH SURGEON ACCESS;  Surgeon: Ardis Hughs, MD;  Location: [facility];  Service: Urology;  Laterality: Left;  . OVARIAN CYST SURGERY    . PARATHYROIDECTOMY  02/26/2012   Procedure: PARATHYROIDECTOMY;  Surgeon: Ascencion Dike, MD;  Location: [facility];  Service: ENT;;  . Azzie [place] DILATION N/A 10/29/2016   Procedure: SAVORY DILATION;  Surgeon: Danie Binder, MD;  [facility];  Service: Endoscopy;  Laterality: N/A;  . [place] SURGERY  2011  . urological surgery for blocked ureter secondary to kidney stone       Social History:  The patient  reports that she has never  smoked. She has never used smokeless tobacco. She reports current alcohol use. She reports that she does not use drugs.   Family History:  The patient's family history includes Cancer in her brother, cousin, cousin, and paternal aunt; Colon cancer in her brother; Diabetes in her daughter; Heart attack in her mother; Leukemia in her paternal aunt;  Pancreatic cancer in her paternal aunt.    ROS:  Please see the history of present illness. All other systems are reviewed and  Negative to the above problem except as noted.    PHYSICAL EXAM: VS:  BP (!) 158/80   Pulse 79   Ht 5' 6.5" (1.689 m)   Wt 184 lb (83.5 kg)   SpO2 98%   BMI 29.25 kg/m   GEN: Well nourished, well developed, in no acute distress  HEENT: normal  Neck: no JVD, carotid bruits, or masses Cardiac: RRR; no murmurs  No LE  edema  Respiratory:  clear to auscultation bilaterally, normal work of breathing GI: soft, nontender, nondistended, + BS  No hepatomegaly  MS: no deformity Moving all extremities   Skin: warm and dry, no rash Neuro:  Strength and sensation are intact Psych: euthymic mood, full affect   EKG:  EKG is ordered today.  SR 79 bpm   T wave inversion II, III, AVF, V3 to V6    Lipid Panel    Component Value Date/Time   CHOL 194 12/15/2019 1041   TRIG 152 (H) 12/15/2019 1041   HDL 57 12/15/2019 1041   CHOLHDL 3.4 12/15/2019 1041   VLDL 27 07/03/2016 1203   LDLCALC 110 (H) 12/15/2019 1041      Wt Readings from Last 3 Encounters:  08/07/20 184 lb (83.5 kg)  04/17/20 184 lb (83.5 kg)  04/12/20 186 lb (84.4 kg)      ASSESSMENT AND PLAN:  1  HTN  BP is OK     2  CP  Pt has occasional CP   I am not convinced it is cardiac   She has a hx of GERD and she is belching in clinc   Follow   3  Hx Dyspnea   Breathing is stable   Volume status is OK  4  HL   Will set up for lipdis    Pt has DM  Should be on statin    5  DM  Discussed diet      F/U in 12 months   Current medicines are reviewed at  length with the patient today.  The patient does not have concerns regarding medicines.  Signed, Dorris Carnes, MD  08/07/2020 9:59 AM    [address], [place], [place]  [address] Phone: [phone]; Fax: [phone]

## 2020-08-07 NOTE — Addendum Note (Signed)
Addended by: Rodman Key on: 08/07/2020 04:06 PM   Modules accepted: Orders

## 2020-08-07 NOTE — Patient Instructions (Signed)
Medication Instructions:  No changes *If you need a refill on your cardiac medications before your next appointment, please call your pharmacy*   Lab Work: Today: lipid panel If you have labs (blood work) drawn today and your tests are completely normal, you will receive your results only by: Marland Kitchen MyChart Message (if you have MyChart) OR . A paper copy in the mail If you have any lab test that is abnormal or we need to change your treatment, we will call you to review the results.   Testing/Procedures: none   Follow-Up: At [facility], you and your health needs are our priority.  As part of our continuing mission to provide you with exceptional heart care, we have created designated Provider Care Teams.  These Care Teams include your primary Cardiologist (physician) and Advanced Practice Providers (APPs -  Physician Assistants and Nurse Practitioners) who all work together to provide you with the care you need, when you need it.    Your next appointment:   12 month(s)  The format for your next appointment:   In Person  Provider:   You may see Dorris Carnes, MD or one of the following Advanced Practice Providers on your designated Care Team:    Richardson Dopp, PA-C  Robbie Lis, [place]    Other Instructions

## 2020-08-08 ENCOUNTER — Other Ambulatory Visit: Payer: Self-pay | Admitting: *Deleted

## 2020-08-08 DIAGNOSIS — E782 Mixed hyperlipidemia: Secondary | ICD-10-CM

## 2020-08-19 ENCOUNTER — Other Ambulatory Visit: Payer: Self-pay | Admitting: Family Medicine

## 2020-08-21 ENCOUNTER — Ambulatory Visit (INDEPENDENT_AMBULATORY_CARE_PROVIDER_SITE_OTHER): Payer: Medicare Other | Admitting: Family Medicine

## 2020-08-21 ENCOUNTER — Other Ambulatory Visit: Payer: Self-pay

## 2020-08-21 ENCOUNTER — Encounter: Payer: Self-pay | Admitting: Family Medicine

## 2020-08-21 VITALS — BP 130/78 | HR 76 | Temp 98.2°F | Resp 14 | Ht 66.5 in | Wt 185.0 lb

## 2020-08-21 DIAGNOSIS — Z0000 Encounter for general adult medical examination without abnormal findings: Secondary | ICD-10-CM

## 2020-08-21 DIAGNOSIS — I10 Essential (primary) hypertension: Secondary | ICD-10-CM | POA: Diagnosis not present

## 2020-08-21 DIAGNOSIS — Z0001 Encounter for general adult medical examination with abnormal findings: Secondary | ICD-10-CM

## 2020-08-21 DIAGNOSIS — R590 Localized enlarged lymph nodes: Secondary | ICD-10-CM

## 2020-08-21 DIAGNOSIS — E782 Mixed hyperlipidemia: Secondary | ICD-10-CM

## 2020-08-21 DIAGNOSIS — N750 Cyst of Bartholin's gland: Secondary | ICD-10-CM

## 2020-08-21 DIAGNOSIS — E1143 Type 2 diabetes mellitus with diabetic autonomic (poly)neuropathy: Secondary | ICD-10-CM | POA: Diagnosis not present

## 2020-08-21 DIAGNOSIS — Z853 Personal history of malignant neoplasm of breast: Secondary | ICD-10-CM

## 2020-08-21 DIAGNOSIS — E1165 Type 2 diabetes mellitus with hyperglycemia: Secondary | ICD-10-CM | POA: Diagnosis not present

## 2020-08-21 MED ORDER — SULFAMETHOXAZOLE-TRIMETHOPRIM 800-160 MG PO TABS
1.0000 | ORAL_TABLET | Freq: Two times a day (BID) | ORAL | 0 refills | Status: DC
Start: 2020-08-21 — End: 2020-10-25

## 2020-08-21 NOTE — Patient Instructions (Addendum)
Ultrasound neck to be done  We will call with lab results Get flu shot at pharmacy F/U 4 months

## 2020-08-21 NOTE — Progress Notes (Signed)
Subjective:   Patient presents for Medicare Annual/Subsequent preventive examination.  Patient here to follow-up medications.  She has not been checking her blood sugars.  She is due for repeat A1c she is on glimepiride and Metformin.  See previous notes for she cannot start medications because of side effects.  She states that she is feeling better she denies any hypoglycemia symptoms.  Her main concern today is a abscess on her left labia states that she has had a knot that has been in her labia for many many years but it became inflamed a few days ago.  She has not noted any drainage but has been using a salve on it.  She has been soaking in Epson salt and that is helped some with the swelling and discomfort.  Denies any vaginal bleeding or UTI symptoms  She was recently evaluated by her cardiologist they are considering Repatha for her cholesterol she does not tolerate statins  Also concerned about lymph nodes that have been very prominent for the past 3 months.  States that she continues to bring it up but sometimes a go down but never go away completely.  She always has some tenderness associated.  The past month however she feels like it has been enlarging. Review Past Medical/Family/Social: per EMR    Risk Factors  Current exercise habits: none  Dietary issues discussed: Yes   Cardiac risk factors:  DM, HLD   Depression Screen  (Note: if answer to either of the following is "Yes", a more complete depression screening is indicated)  Over the past two weeks, have you felt down, depressed or hopeless? No Over the past two weeks, have you felt little interest or pleasure in doing things? No Have you lost interest or pleasure in daily life? No Do you often feel hopeless? No Do you cry easily over simple problems? No   Activities of Daily Living  In your present state of health, do you have any difficulty performing the following activities?:  Driving? No  Managing money? No   Feeding yourself? No  Getting from bed to chair? No  Climbing a flight of stairs? No  Preparing food and eating?: No  Bathing or showering? No  Getting dressed: No  Getting to the toilet? No  Using the toilet:No  Moving around from place to place: No  In the past year have you fallen or had a near fall?:No    Hearing Difficulties: No  Do you often ask people to speak up or repeat themselves? No  Do you experience ringing or noises in your ears? No Do you have difficulty understanding soft or whispered voices? No  Do you feel that you have a problem with memory? No Do you often misplace items? No  Do you feel safe at home? Yes  Cognitive Testing  Alert? Yes Normal Appearance?Yes  Oriented to person? Yes Place? Yes  Time? Yes  Recall of three objects? Yes  Can perform simple calculations? Yes  Displays appropriate judgment?Yes  Can read the correct time from a watch face?Yes   List the Names of Other Physician/Practitioners you currently use:  Cardiology [facility]- Dr. Jorja Loa Heme/Onc- [facility]   Screening Tests / Date  Colonoscopy  UTD                    Mammogram  UTD  Influenza Vaccine - declines today   COVID UTD  PNA UTD Tetanus/tdap UTD  ROS:  GEN- denies fatigue, fever,  weight loss,weakness, recent illness HEENT- denies eye drainage, change in vision, nasal discharge, CVS- denies chest pain, palpitations RESP- denies SOB, cough, wheeze ABD- denies N/V, change in stools, abd pain GU- denies dysuria, hematuria, dribbling, incontinence MSK- denies joint pain, muscle aches, injury Neuro- denies headache, dizziness, syncope, seizure activity  PHYSICAL:  GEN- NAD, alert and oriented x3 HEENT- PERRL, EOMI, non injected sclera, pink conjunctiva, MMM, oropharynx clear Neck- Supple, no thryomegaly , no bruit , 2 prominent lymph node left anter, and shottypea size left post auricular TTP  CVS- RRR, no murmur RESP-CTAB ABD-NABS, soft,NT, Ventral  hernia GU- normal external genitalia, left labia majora cyst with ulceration at center, mild blood/pus expressed, swelling of left labia, TTP, no other lesions  EXT- No edema Pulses- Radial, DP- 2+    Assessment:    Annual wellness medicare exam   Plan:    During the course of the visit the patient was educated and counseled about appropriate screening and preventive services including:   Infected Bartholin cyst.  Discussed options with her.  We will try antibiotics orally along with Epson salt soaks.  She has had the cyst for quite some time.  She did not respond and she is willing to proceed with GYN for cervical drainage.  Diabetes mellitus goals is to at least keep A1c less than 8%.  She is able to tolerate the dose of Metformin and glimepiride she is currently on.  We will check labs today.  Hypertension controlled  HLD- per cardiology   Chronic lymphadenopathy in her cervical region with history of breast cancer.  I recommend obtaining ultrasound of the neck to further evaluate.  Prevention she will get flu shot in the couple weeks as well when he gets a day.  Has POA, full code      Diet review for nutrition referral? Yes ____ Not Indicated __x__  Patient Instructions (the written plan) was given to the patient.  Medicare Attestation  I have personally reviewed:  The patient's medical and social history  Their use of alcohol, tobacco or illicit drugs  Their current medications and supplements  The patient's functional ability including ADLs,fall risks, home safety risks, cognitive, and hearing and visual impairment  Diet and physical activities  Evidence for depression or mood disorders  The patient's weight, height, BMI, and visual acuity have been recorded in the chart. I have made referrals, counseling, and provided education to the patient based on review of the above and I have provided the patient with a written personalized care plan for preventive services.

## 2020-08-22 ENCOUNTER — Ambulatory Visit (INDEPENDENT_AMBULATORY_CARE_PROVIDER_SITE_OTHER): Payer: Medicare Other | Admitting: Pharmacist

## 2020-08-22 DIAGNOSIS — E782 Mixed hyperlipidemia: Secondary | ICD-10-CM | POA: Diagnosis not present

## 2020-08-22 LAB — COMPREHENSIVE METABOLIC PANEL
AG Ratio: 1.3 (calc) (ref 1.0–2.5)
ALT: 15 U/L (ref 6–29)
AST: 20 U/L (ref 10–35)
Albumin: 4 g/dL (ref 3.6–5.1)
Alkaline phosphatase (APISO): 46 U/L (ref 37–153)
BUN/Creatinine Ratio: 10 (calc) (ref 6–22)
BUN: 10 mg/dL (ref 7–25)
CO2: 27 mmol/L (ref 20–32)
Calcium: 9.4 mg/dL (ref 8.6–10.4)
Chloride: 105 mmol/L (ref 98–110)
Creat: 1 mg/dL — ABNORMAL HIGH (ref 0.60–0.93)
Globulin: 3.1 g/dL (calc) (ref 1.9–3.7)
Glucose, Bld: 178 mg/dL — ABNORMAL HIGH (ref 65–99)
Potassium: 3.7 mmol/L (ref 3.5–5.3)
Sodium: 141 mmol/L (ref 135–146)
Total Bilirubin: 0.6 mg/dL (ref 0.2–1.2)
Total Protein: 7.1 g/dL (ref 6.1–8.1)

## 2020-08-22 LAB — HEMOGLOBIN A1C
Hgb A1c MFr Bld: 7.7 % of total Hgb — ABNORMAL HIGH (ref <5.7)
Mean Plasma Glucose: 174 (calc)
eAG (mmol/L): 9.7 (calc)

## 2020-08-22 LAB — CBC WITH DIFFERENTIAL/PLATELET
Absolute Monocytes: 672 cells/uL (ref 200–950)
Basophils Absolute: 77 cells/uL (ref 0–200)
Basophils Relative: 0.8 %
Eosinophils Absolute: 202 cells/uL (ref 15–500)
Eosinophils Relative: 2.1 %
HCT: 35.7 % (ref 35.0–45.0)
Hemoglobin: 11.5 g/dL — ABNORMAL LOW (ref 11.7–15.5)
Lymphs Abs: 2611 cells/uL (ref 850–3900)
MCH: 25.4 pg — ABNORMAL LOW (ref 27.0–33.0)
MCHC: 32.2 g/dL (ref 32.0–36.0)
MCV: 79 fL — ABNORMAL LOW (ref 80.0–100.0)
MPV: 11 fL (ref 7.5–12.5)
Monocytes Relative: 7 %
Neutro Abs: 6038 cells/uL (ref 1500–7800)
Neutrophils Relative %: 62.9 %
Platelets: 229 10*3/uL (ref 140–400)
RBC: 4.52 10*6/uL (ref 3.80–5.10)
RDW: 14.4 % (ref 11.0–15.0)
Total Lymphocyte: 27.2 %
WBC: 9.6 10*3/uL (ref 3.8–10.8)

## 2020-08-22 MED ORDER — ROSUVASTATIN CALCIUM 5 MG PO TABS
5.0000 mg | ORAL_TABLET | Freq: Every day | ORAL | 11 refills | Status: DC
Start: 2020-08-22 — End: 2020-10-25

## 2020-08-22 NOTE — Patient Instructions (Addendum)
It was nice to meet you today!  Your LDL (bad) cholesterol is 115 and your goal is less than 70  Start taking rosuvastatin (Crestor) 5mg  - 1 tablet once a day  Recheck cholesterol in 3 months. Come in for fasting labs any time after 7:30am on Tuesday, March 1st  Call Ted Leonhart with any trouble tolerating your medication [phone]

## 2020-08-22 NOTE — Progress Notes (Signed)
Patient ID: KYLANI WIRES                 DOB: 08/04/1946                    MRN: [id number]     HPI: [name] is a 74 y.o. female patient referred to lipid clinic by Dr Harrington Challenger. PMH is significant for mild aortic atherosclerosis noted on abdominal CT 12/30/19, DM, HTN, HLD, DM, hepatic steatosis, IBS, and obesity. She has a history of statin intolerance and was referred to lipid clinic for further management.  Pt presents today in good spirits. She doesn't recall which medications she has taken for her cholesterol in the past. Our records show ezetimibe prescribed about 10 years ago. Listed ~5 different statins but none sounded familiar to pt. She does have sciatica and pain at baseline, as well as IBS and GI issues. She does not want to take injectable medication for her cholesterol and is a bit hesitant of statins.  Current Medications: none Intolerances: ezetimibe 51m daily (2013) Risk Factors: aortic atherosclerosis, DM, obesity, age, HTN LDL goal: <728mdL  Diet: Has IBS, doesn't have a big appetite. Drinking more boost. Does drink regular Coke, doesn't like diet - is trying to cut back to 1 a day, sometimes doesn't have any in a day. Otherwise drinks water with lemon. Likes fruit and popcorn.  Exercise: Volunteers, tries to walk. Goal weight per pt is 170 lbs.  Family History: The patient's family history includes Cancer in her brother, cousin, cousin, and paternal aunt; Colon cancer in her brother; Diabetes in her daughter; Heart attack in her mother; Leukemia in her paternal aunt; Pancreatic cancer in her paternal aunt.   Social History: The patient  reports that she has never smoked. She has never used smokeless tobacco. She reports current alcohol use. She reports that she does not use drugs.   Labs: 08/07/20: TC 212, TG 150, HDL 71, LDL 115 (no lipid lowering therapy)  Past Medical History:  Diagnosis Date  . Abdominal hernia 02/26/12   unrepaired  . 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 Anemia   . Anxiety   . Aortic atherosclerosis ([facility] [place]  . Arthritis   . Blood transfusion 1980's  . Breast cancer ([facility]  . Cancer ([facility]   Phreesia 08/18/2020  . Complication of anesthesia    has a hard time waking up; can't lay flat  . Degenerative disc disease, lumbar    pressing on L3 and L4  . Diabetes mellitus ([facility] [place]   Type 2 NIDDM; "cancer treatment gave me diabetes"  . Diabetes mellitus without complication ([facility]   Phreesia 08/18/2020  . Diverticulosis   . DJD (degenerative joint disease) of lumbar spine   . Dysrhythmia    "irregular"  . Exertional dyspnea   . Gastroparesis   . GERD (gastroesophageal reflux disease)    mann  . Heart murmur    "think I outgrew it"  . Hepatic steatosis   . History of kidney stones   . History of stomach ulcers   . Hx: UTI (urinary tract infection)   . Hyperlipidemia   . Hypersensitivity    in tongue  . Hypertension   . IBS (irritable bowel syndrome)   . IBS (irritable bowel syndrome)   . Insomnia   . Internal hemorrhoids   . Kidney cysts    RT KIDNEY  .  Kidney stones 2009   s/p lithotripsy  . Obesity   . PONV (postoperative nausea and vomiting)   . Pulmonary nodule   . Shortness of breath    unable to lie flat  . Ventral hernia     Current Outpatient Medications on File Prior to Visit  Medication Sig Dispense Refill  . acetaminophen (TYLENOL) 325 MG tablet Take 325 mg by mouth every 6 (six) hours as needed for mild pain.     . Blood Glucose Monitoring Suppl (BLOOD GLUCOSE SYSTEM PAK) KIT Use as directed to monitor FSBS 1x daily. Dx: E11.9 1 each 1  . Cholecalciferol (VITAMIN D3 PO) Take 1 tablet by mouth daily.    Marland Kitchen CINNAMON PO Take 1 capsule by mouth. Takes sometimes    . famotidine (PEPCID) 20 MG tablet TAKE ONE TABLET BY MOUTH TWICE DAILY. 60 tablet 2  . furosemide (LASIX) 40 MG tablet Take 1 tablet (40 mg total) by mouth every other day. (Patient taking  differently: Take 40 mg by mouth as needed. ) 45 tablet 3  . glimepiride (AMARYL) 4 MG tablet 1 tablet twice daily, please make follow-up appointment 60 tablet 0  . glucose blood (ACCU-CHEK GUIDE) test strip 1 each by Other route as needed for other. Use as instructed to check blood sugar daily. 100 each 3  . LANCETS SUPER THIN 28G MISC Use to check blood sugar once daily. 100 each 3  . metFORMIN (GLUCOPHAGE) 500 MG tablet TAKE ONE TABLET BY MOUTH 2 TIMES A DAY WITH A MEAL 60 tablet 6  . Multiple Vitamin (MULTIVITAMIN) tablet Take 1 tablet by mouth daily.    . Nebivolol HCl (BYSTOLIC) 20 MG TABS Take 1 tablet (20 mg total) by mouth 2 (two) times daily. 60 tablet 5  . potassium chloride (K-DUR) 10 MEQ tablet Take 1 tablet (10 mEq total) by mouth every other day. (Patient taking differently: Take 10 mEq by mouth every other day. ) 45 tablet 3  . sulfamethoxazole-trimethoprim (BACTRIM DS) 800-160 MG tablet Take 1 tablet by mouth 2 (two) times daily. 14 tablet 0  . tetrahydrozoline-zinc (VISINE-AC) 0.05-0.25 % ophthalmic solution Place 2 drops into both eyes 3 (three) times daily as needed (dry eyes).     No current facility-administered medications on file prior to visit.    Allergies  Allergen Reactions  . Demerol [Meperidine] Other (See Comments)    Lost consciousness  . Bentyl [Dicyclomine Hcl] Swelling  . Invokana [Canagliflozin] Hives  . Metformin And Related Diarrhea  . Protonix [Pantoprazole Sodium] Swelling  . Statins Other (See Comments)    myalgia  . Ace Inhibitors Cough    Assessment/Plan:  1. Hyperlipidemia - Baseline LDL 115 above goal < 70. Pt does not recall what medications she has tried in the past for cholesterol and our records only show ezetimibe. She is willing to rechallenge with low dose rosuvastatin 28m daily. She does not wish to take injectable medication for her cholesterol. Advised pt to call clinic with any trouble tolerating rosuvastatin. Can try 3x/week dosing  or low dose pravastatin instead. Otherwise, will plan to recheck fasting labs in 3 months.  Destenie Ingber E. Lelani Garnett, PharmD, BCACP, [id number] [address]Phone: [phone] Fax: ([phone]/30/2021 1:59 PM

## 2020-08-24 ENCOUNTER — Encounter: Payer: Self-pay | Admitting: *Deleted

## 2020-08-24 NOTE — Chronic Care Management (AMB) (Incomplete)
Chronic Care Management   Follow Up Note   05/24/2020 Name: [name] MRN: [id number] DOB: 1946-06-14  Referred by: Alycia Rossetti, MD Reason for referral : Chronic Care Management (PharmD follow up)   [name] is a 74 y.o. year old female who is a primary care patient of [place], Modena Nunnery, MD. The CCM team was consulted for assistance with chronic disease management and care coordination needs.    Review of patient status, including review of consultants reports, relevant laboratory and other test results, and collaboration with appropriate care team members and the patient's provider was performed as part of comprehensive patient evaluation and provision of chronic care management services.    SDOH (Social Determinants of Health) assessments performed: No See Care Plan activities for detailed interventions related to [facility])     Outpatient Encounter Medications as of 05/24/2020  Medication Sig  . acetaminophen (TYLENOL) 325 MG tablet Take 325 mg by mouth every 6 (six) hours as needed for mild pain.   . Blood Glucose Monitoring Suppl (BLOOD GLUCOSE SYSTEM PAK) KIT Use as directed to monitor FSBS 1x daily. Dx: E17.4  . BYSTOLIC 20 MG TABS TAKE 1 TABLET BY MOUTH TWICE DAILY.  Marland Kitchen Cholecalciferol (VITAMIN D3 PO) Take 1 tablet by mouth daily.  Marland Kitchen CINNAMON PO Take 1 capsule by mouth. Takes sometimes  . famotidine (PEPCID) 20 MG tablet TAKE ONE TABLET BY MOUTH TWICE DAILY.  . furosemide (LASIX) 40 MG tablet Take 1 tablet (40 mg total) by mouth every other day. (Patient taking differently: Take 40 mg by mouth as needed. )  . glimepiride (AMARYL) 4 MG tablet TAKE 1 TABLET BY MOUTH TWICE DAILY.  Marland Kitchen glucose blood (ACCU-CHEK GUIDE) test strip 1 each by Other route as needed for other. Use as instructed to check blood sugar daily.  Marland Kitchen LANCETS SUPER THIN 28G MISC Use to check blood sugar once daily.  . metFORMIN (GLUCOPHAGE) 500 MG tablet Take 1 tablet (500 mg total) by mouth 2 (two) times  daily with a meal.  . Multiple Vitamin (MULTIVITAMIN) tablet Take 1 tablet by mouth daily.  . potassium chloride (K-DUR) 10 MEQ tablet Take 1 tablet (10 mEq total) by mouth every other day. (Patient taking differently: Take 10 mEq by mouth every other day. )  . tetrahydrozoline-zinc (VISINE-AC) 0.05-0.25 % ophthalmic solution Place 2 drops into both eyes 3 (three) times daily as needed (dry eyes).   No facility-administered encounter medications on file as of 05/24/2020.    Recent visits: 08/21/2020 [facility]) - ultrasound of neck to be done due to pronounces lymph nodes ongoing for past 3 months.  Cardiology is evaluating [place] for her lipids because she cannot tolerate statins.  Goals Addressed   None     Diabetes   A1c goal {A1c goals:23924}  Recent Relevant Labs: Lab Results  Component Value Date/Time   HGBA1C 7.7 (H) 08/21/2020 10:11 AM   HGBA1C 7.8 (A) 04/12/2020 11:32 AM   HGBA1C 8.5 (H) 12/15/2019 10:41 AM   GFR 55.96 (L) 02/23/2020 11:15 AM   GFR 63.76 12/04/2018 10:09 AM   MICROALBUR 7.9 (H) 02/23/2020 11:15 AM   MICROALBUR 11.8 (H) 03/09/2018 08:42 AM    Last diabetic Eye exam:  Lab Results  Component Value Date/Time   HMDIABEYEEXA No Retinopathy 03/24/2018 12:00 AM    Last diabetic Foot exam:  Lab Results  Component Value Date/Time   HMDIABFOOTEX yes  07/23/2010 12:00 AM     Checking BG: {CHL HP Blood Glucose Monitoring  Frequency:[phone]}  Recent FBG Readings: *** Recent pre-meal BG readings: *** Recent 2hr PP BG readings:  *** Recent HS BG readings: ***  Patient has failed these meds in past: *** Patient is currently {CHL Controlled/Uncontrolled:[phone]} on the following medications: Marland Kitchen Metformin 561m twice daily with a meal . Glimepiride 484mtwice daily  We discussed: {CHL HP Upstream Pharmacy discussion:[phone]}  Plan  Continue {CHL HP Upstream Pharmacy [phone]}   Hypertension   BP goal is:  {CHL HP UPSTREAM Pharmacist BP  ranges:[phone]}  Office blood pressures are  BP Readings from Last 3 Encounters:  08/21/20 130/78  08/07/20 (!) 158/80  04/17/20 (!) 132/76   Patient checks BP at home {CHL HP BP Monitoring Frequency:[phone]} Patient home BP readings are ranging: ***  Patient has failed these meds in the past: *** Patient is currently {CHL Controlled/Uncontrolled:[phone]} on the following medications:  . Nebivolol 2020mWe discussed {CHL HP Upstream Pharmacy discussion:[phone]}  Plan  Continue {CHL HP Upstream Pharmacy [phone]}     Hyperlipidemia   LDL goal < ***  Last lipids Lab Results  Component Value Date   CHOL 212 (H) 08/07/2020   HDL 71 08/07/2020   LDLCALC 115 (H) 08/07/2020   TRIG 150 (H) 08/07/2020   CHOLHDL 3.0 08/07/2020   Hepatic Function Latest Ref Rng & Units 08/21/2020 12/15/2019 04/09/2019  Total Protein 6.1 - 8.1 g/dL 7.1 7.0 7.4  Albumin 3.5 - 5.2 g/dL - - -  AST 10 - 35 U/L '20 22 18  ' ALT 6 - 29 U/L '15 20 17  ' Alk Phosphatase 39 - 117 U/L - - -  Total Bilirubin 0.2 - 1.2 mg/dL 0.6 0.4 0.5  Bilirubin, Direct 0.0 - 0.3 mg/dL - - -     The 10-year ASCVD risk score (GoMikey Bussing Jr., et al., 2013) is: 34.2%   Values used to calculate the score:     Age: [age]ars     Sex: Female     Is Non-Hispanic African American: Yes     Diabetic: Yes     Tobacco smoker: No     Systolic Blood Pressure: 130458Hg     Is BP treated: Yes     HDL Cholesterol: 71 mg/dL     Total Cholesterol: 212 mg/dL   Patient has failed these meds in past: statins (myalgias) Patient is currently {CHL Controlled/Uncontrolled:[phone]} on the following medications:  . None . Rosuvastatin 5mg73m We discussed:  {CHL HP Upstream Pharmacy discussion:[phone]}  Plan  Continue {CHL HP Upstream Pharmacy PlanKDXIP:[phone]} ChriBeverly MilcharmD Clinical Pharmacist [id number] [phone]

## 2020-08-25 ENCOUNTER — Other Ambulatory Visit: Payer: Self-pay | Admitting: Endocrinology

## 2020-08-25 ENCOUNTER — Ambulatory Visit: Payer: Medicare Other

## 2020-08-28 ENCOUNTER — Telehealth: Payer: Self-pay

## 2020-08-28 ENCOUNTER — Ambulatory Visit (HOSPITAL_COMMUNITY)
Admission: RE | Admit: 2020-08-28 | Discharge: 2020-08-28 | Disposition: A | Discharge status: Home / Self Care | Payer: Medicare Other | Source: Ambulatory Visit | Attending: Family Medicine | Admitting: Family Medicine

## 2020-08-28 ENCOUNTER — Other Ambulatory Visit: Payer: Self-pay

## 2020-08-28 DIAGNOSIS — Z853 Personal history of malignant neoplasm of breast: Secondary | ICD-10-CM | POA: Diagnosis not present

## 2020-08-28 DIAGNOSIS — R590 Localized enlarged lymph nodes: Secondary | ICD-10-CM | POA: Diagnosis present

## 2020-09-01 NOTE — Progress Notes (Signed)
Called pt, lm for call bk to discuss follow up imaging needed

## 2020-09-09 LAB — HM DIABETES EYE EXAM

## 2020-09-21 ENCOUNTER — Telehealth: Payer: Self-pay | Admitting: Pharmacist

## 2020-09-21 NOTE — Progress Notes (Signed)
Verified Adherence Gap Information. Per insurance data, the patient is 90%-99% compliant with the DM medication.Patient was in complaint with keeping A1C less than 9 and keeping A1C testing documentation. Patient met blood pressure goals staying lower 140/90 Patent did not perform a AWV this year. Patient total gap is equal to 3.   Veronica Mclemore, RMA Clinical Pharmacist Assistant [phone]  

## 2020-09-25 ENCOUNTER — Other Ambulatory Visit: Payer: Self-pay | Admitting: Endocrinology

## 2020-10-25 ENCOUNTER — Encounter: Payer: Self-pay | Admitting: Family Medicine

## 2020-10-25 ENCOUNTER — Ambulatory Visit: Payer: Medicare Other | Admitting: Family Medicine

## 2020-10-25 ENCOUNTER — Other Ambulatory Visit: Payer: Self-pay

## 2020-10-25 VITALS — BP 120/64 | HR 78 | Temp 98.3°F | Resp 16 | Ht 66.5 in | Wt 182.0 lb

## 2020-10-25 DIAGNOSIS — E1165 Type 2 diabetes mellitus with hyperglycemia: Secondary | ICD-10-CM

## 2020-10-25 DIAGNOSIS — R590 Localized enlarged lymph nodes: Secondary | ICD-10-CM

## 2020-10-25 DIAGNOSIS — R2689 Other abnormalities of gait and mobility: Secondary | ICD-10-CM | POA: Diagnosis not present

## 2020-10-25 DIAGNOSIS — I10 Essential (primary) hypertension: Secondary | ICD-10-CM

## 2020-10-25 DIAGNOSIS — R42 Dizziness and giddiness: Secondary | ICD-10-CM

## 2020-10-25 DIAGNOSIS — Z170 Estrogen receptor positive status [ER+]: Secondary | ICD-10-CM

## 2020-10-25 DIAGNOSIS — C50919 Malignant neoplasm of unspecified site of unspecified female breast: Secondary | ICD-10-CM

## 2020-10-25 NOTE — Assessment & Plan Note (Signed)
Unclear cause of the balance issues No sign of infection BP normal Will check labs She has strong risk factors for CVA, will obtain imaging of the brain to look for ischemia events, vs mass

## 2020-10-25 NOTE — Assessment & Plan Note (Signed)
She has checked CBG recently Check A1C Multiple meds she doesn't tolerate

## 2020-10-25 NOTE — Progress Notes (Signed)
Subjective:    Patient ID: [name], female    DOB: 04/02/1946, 75 y.o.   MRN: [id number]  Patient presents for balance loss (Pt reports 3 weeks has been feeling off balance, denies dizzy spell or lightheaded legs feel weak at times )    Pt here with feeling of loosing balance for , she stumbles around when she stands up She doesn't feel light headed  But occasionally feels  "woozy or dizzy" and may feel nauseated but this isware  she had had falls but she didn't hit the floor, states she would catch herself   she has been checking her BP and it has been normal  DM- states her CBG has been okay last A1C 7.7% She has felt better since reduction    She was seen by cardiology, told chest pain non cardiac- she was placed on crestor, but had leg pains so she stopped it her symptoms resolved  She has not had to use lasix regulary       Review Of Systems: per above   GEN- denies fatigue, fever, weight loss,weakness, recent illness HEENT- denies eye drainage, change in vision, nasal discharge, CVS- denies chest pain, palpitations RESP- denies SOB, cough, wheeze ABD- denies N/V, change in stools, abd pain GU- denies dysuria, hematuria, dribbling, incontinence MSK- denies joint pain, muscle aches, injury Neuro- denies headache, +dizziness, syncope, seizure activity       Objective:    BP 120/64   Pulse 78   Temp 98.3 F (36.8 C) (Temporal)   Resp 16   Ht 5' 6.5" (1.689 m)   Wt 182 lb (82.6 kg)   SpO2 98%   BMI 28.94 kg/m  GEN- NAD, alert and oriented x3 HEENT- PERRL, EOMI, non injected sclera, pink conjunctiva, MMM, oropharynx clear Neck- Supple, no thryomegaly , no bruit , 2 prominent lymph node left anter, and shottypea size left post auricular TTP  CVS- RRR, no murmur RESP-CTAB ABD-NABS, soft,NT, Ventral hernia Ext- no edema NEURO-CNII-XII in tact, gait unsteady, requires helping stepping up, reaches for wall at times when walking , no nystagmus        Assessment & Plan:      Problem List Items Addressed This Visit      Unprioritized   Essential hypertension    Unclear cause of the balance issues No sign of infection BP normal Will check labs She has strong risk factors for CVA, will obtain imaging of the brain to look for ischemia events, vs mass       Relevant Orders   CBC with Differential/Platelet   Comprehensive metabolic panel   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 ([place])   Relevant Orders   CT Soft Tissue Neck W Contrast   MR Brain Wo Contrast   Uncontrolled type 2 diabetes mellitus with hyperglycemia, without long-term current use of insulin ([place])    She has checked CBG recently Check A1C Multiple meds she doesn't tolerate      Relevant Orders   CBC with Differential/Platelet   Comprehensive metabolic panel   Hemoglobin A1c    Other Visit Diagnoses    Balance problem    -  Primary   Relevant Orders   MR Brain Wo Contrast   Dizzy spells       Relevant Orders   MR Brain Wo Contrast   Cervical lymphadenopathy       abnormal [place], recommend CT of neck, will obtain   Relevant Orders   CT Soft Tissue Neck W  Contrast   MR Brain Wo Contrast      Note: This dictation was prepared with Dragon dictation along with smaller phrase technology. Any transcriptional errors that result from this process are unintentional.

## 2020-10-25 NOTE — Patient Instructions (Addendum)
CT of neck/ Head  We will call with lab results

## 2020-10-26 ENCOUNTER — Other Ambulatory Visit: Payer: Self-pay | Admitting: Endocrinology

## 2020-10-26 LAB — HEMOGLOBIN A1C
Hgb A1c MFr Bld: 8.6 % of total Hgb — ABNORMAL HIGH (ref <5.7)
Mean Plasma Glucose: 200 mg/dL
eAG (mmol/L): 11.1 mmol/L

## 2020-10-26 LAB — CBC WITH DIFFERENTIAL/PLATELET
Absolute Monocytes: 824 cells/uL (ref 200–950)
Basophils Absolute: 93 cells/uL (ref 0–200)
Basophils Relative: 0.9 %
Eosinophils Absolute: 216 cells/uL (ref 15–500)
Eosinophils Relative: 2.1 %
HCT: 34.9 % — ABNORMAL LOW (ref 35.0–45.0)
Hemoglobin: 11.4 g/dL — ABNORMAL LOW (ref 11.7–15.5)
Lymphs Abs: 2997 cells/uL (ref 850–3900)
MCH: 25.8 pg — ABNORMAL LOW (ref 27.0–33.0)
MCHC: 32.7 g/dL (ref 32.0–36.0)
MCV: 79 fL — ABNORMAL LOW (ref 80.0–100.0)
MPV: 12.2 fL (ref 7.5–12.5)
Monocytes Relative: 8 %
Neutro Abs: 6170 cells/uL (ref 1500–7800)
Neutrophils Relative %: 59.9 %
Platelets: 204 10*3/uL (ref 140–400)
RBC: 4.42 10*6/uL (ref 3.80–5.10)
RDW: 13.6 % (ref 11.0–15.0)
Total Lymphocyte: 29.1 %
WBC: 10.3 10*3/uL (ref 3.8–10.8)

## 2020-10-26 LAB — COMPREHENSIVE METABOLIC PANEL
AG Ratio: 1.3 (calc) (ref 1.0–2.5)
ALT: 14 U/L (ref 6–29)
AST: 19 U/L (ref 10–35)
Albumin: 4 g/dL (ref 3.6–5.1)
Alkaline phosphatase (APISO): 52 U/L (ref 37–153)
BUN/Creatinine Ratio: 13 (calc) (ref 6–22)
BUN: 14 mg/dL (ref 7–25)
CO2: 25 mmol/L (ref 20–32)
Calcium: 9.2 mg/dL (ref 8.6–10.4)
Chloride: 105 mmol/L (ref 98–110)
Creat: 1.07 mg/dL — ABNORMAL HIGH (ref 0.60–0.93)
Globulin: 3.1 g/dL (calc) (ref 1.9–3.7)
Glucose, Bld: 245 mg/dL — ABNORMAL HIGH (ref 65–99)
Potassium: 3.7 mmol/L (ref 3.5–5.3)
Sodium: 139 mmol/L (ref 135–146)
Total Bilirubin: 0.4 mg/dL (ref 0.2–1.2)
Total Protein: 7.1 g/dL (ref 6.1–8.1)

## 2020-10-30 ENCOUNTER — Other Ambulatory Visit: Payer: Self-pay

## 2020-11-01 ENCOUNTER — Encounter: Payer: Self-pay | Admitting: Family Medicine

## 2020-11-01 MED ORDER — GLIMEPIRIDE 4 MG PO TABS
4.0000 mg | ORAL_TABLET | Freq: Two times a day (BID) | ORAL | 2 refills | Status: DC
Start: 2020-11-01 — End: 2021-01-01

## 2020-11-10 ENCOUNTER — Ambulatory Visit (HOSPITAL_COMMUNITY)
Admission: RE | Admit: 2020-11-10 | Discharge: 2020-11-10 | Disposition: A | Discharge status: Home / Self Care | Payer: Medicare Other | Source: Ambulatory Visit | Attending: Family Medicine | Admitting: Family Medicine

## 2020-11-10 ENCOUNTER — Other Ambulatory Visit: Payer: Self-pay

## 2020-11-10 ENCOUNTER — Other Ambulatory Visit: Payer: Self-pay | Admitting: Internal Medicine

## 2020-11-10 ENCOUNTER — Encounter (HOSPITAL_COMMUNITY): Payer: Self-pay

## 2020-11-10 DIAGNOSIS — R2689 Other abnormalities of gait and mobility: Secondary | ICD-10-CM | POA: Insufficient documentation

## 2020-11-10 DIAGNOSIS — R42 Dizziness and giddiness: Secondary | ICD-10-CM | POA: Insufficient documentation

## 2020-11-10 DIAGNOSIS — Z170 Estrogen receptor positive status [ER+]: Secondary | ICD-10-CM | POA: Insufficient documentation

## 2020-11-10 DIAGNOSIS — R590 Localized enlarged lymph nodes: Secondary | ICD-10-CM

## 2020-11-10 DIAGNOSIS — C50919 Malignant neoplasm of unspecified site of unspecified female breast: Secondary | ICD-10-CM | POA: Diagnosis present

## 2020-11-10 MED ORDER — IOHEXOL 300 MG/ML  SOLN
75.0000 mL | Freq: Once | INTRAMUSCULAR | Status: AC | PRN
  Administered 2020-11-10: 75 mL via INTRAVENOUS

## 2020-11-15 ENCOUNTER — Other Ambulatory Visit: Payer: Self-pay

## 2020-11-15 DIAGNOSIS — R42 Dizziness and giddiness: Secondary | ICD-10-CM

## 2020-11-15 DIAGNOSIS — R2689 Other abnormalities of gait and mobility: Secondary | ICD-10-CM

## 2020-11-21 ENCOUNTER — Other Ambulatory Visit: Payer: Medicare Other

## 2020-12-06 ENCOUNTER — Ambulatory Visit: Payer: Medicare Other | Attending: Family Medicine

## 2020-12-06 ENCOUNTER — Other Ambulatory Visit: Payer: Self-pay

## 2020-12-06 VITALS — BP 163/92 | HR 72

## 2020-12-06 DIAGNOSIS — R2681 Unsteadiness on feet: Secondary | ICD-10-CM | POA: Diagnosis present

## 2020-12-06 DIAGNOSIS — R42 Dizziness and giddiness: Secondary | ICD-10-CM | POA: Diagnosis not present

## 2020-12-06 NOTE — Therapy (Signed)
[place], [place], [address] Phone: [phone]   Fax:  [phone]  Physical Therapy Evaluation  Patient Details  Name: [name] MRN: [id number] Date of Birth: [birth year] Referring Provider (PT): [name] F   Encounter Date: 12/06/2020   PT End of Session - 12/06/20 1109    Visit Number 1    Number of Visits 17    Date for PT Re-Evaluation 01/31/21    Authorization Type [employer] MCR    Progress Note Due on Visit 10    PT Start Time 1100    PT Stop Time 1145    PT Time Calculation (min) 45 min    Activity Tolerance Patient tolerated treatment well;Other (comment)   limited by dizziness   Behavior During Therapy [facility] for tasks assessed/performed           Past Medical History:  Diagnosis Date  . Abdominal hernia 02/26/12   unrepaired  . 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 Anemia   . Anxiety   . Aortic atherosclerosis ([place])   . Arthritis   . Blood transfusion 1980's  . Breast cancer ([place])   . Cancer ([place])    Phreesia 08/18/2020  . Complication of anesthesia    has a hard time waking up; can't lay flat  . Degenerative disc disease, lumbar    pressing on L3 and L4  . Diabetes mellitus ([place]) 1989   Type 2 NIDDM; "cancer treatment gave me diabetes"  . Diabetes mellitus without complication ([place])    Phreesia 08/18/2020  . Diverticulosis   . DJD (degenerative joint disease) of lumbar spine   . Dysrhythmia    "irregular"  . Exertional dyspnea   . Gastroparesis   . GERD (gastroesophageal reflux disease)    mann  . Heart murmur    "think I outgrew it"  . Hepatic steatosis   . History of kidney stones   . History of stomach ulcers   . Hx: UTI (urinary tract infection)   . Hyperlipidemia   . Hypersensitivity    in tongue  . Hypertension   . IBS (irritable bowel syndrome)   . IBS (irritable bowel syndrome)   . Insomnia   . Internal  hemorrhoids   . Kidney cysts    RT KIDNEY  . Kidney stones 2009   s/p lithotripsy  . Obesity   . PONV (postoperative nausea and vomiting)   . Pulmonary nodule   . Shortness of breath    unable to lie flat  . Ventral hernia     Past Surgical History:  Procedure Laterality Date  . ABDOMINAL HYSTERECTOMY  2002  . APPENDECTOMY    . BACK SURGERY     Can't take any medical procedure in right arm  . BREAST BIOPSY     right  . BREAST SURGERY N/A    Phreesia 04/22/2020  . CATARACT EXTRACTION W/ INTRAOCULAR LENS  IMPLANT, BILATERAL  2009-2010  . CESAREAN SECTION  1973; 1978; 1981  . CESAREAN SECTION N/A    Phreesia 04/22/2020  . COLONOSCOPY WITH PROPOFOL N/A 10/29/2016   Procedure: COLONOSCOPY WITH PROPOFOL;  Surgeon: Danie Binder, MD;  [facility];  Service: Endoscopy;  Laterality: N/A;  10:00 am  . CYSTOSCOPY Left 04/13/2014   Procedure: CYSTOSCOPY FLEXIBLE;  Surgeon: Ardis Hughs, MD;  Location: [facility];  Service: Urology;  Laterality: Left;  with STENT  .  ESOPHAGOGASTRODUODENOSCOPY (EGD) WITH PROPOFOL N/A 10/29/2016   Procedure: ESOPHAGOGASTRODUODENOSCOPY (EGD) WITH PROPOFOL;  Surgeon: Danie Binder, MD;  [facility];  Service: Endoscopy;  Laterality: N/A;  . EYE SURGERY    . implant and screws put in the right lower jaw  11/2008   dental surgery  . kidney blockage  ?1990's   " major surgery ;put kidney on pump for awhile"  . LITHOTRIPSY     "several times"  . LUMBAR FUSION  08/2010  . MASTECTOMY  1997   right  . NEPHROLITHOTOMY Left 04/13/2014   Procedure: LEFT PERCUTANEOUS NEPHROLITHOTOMY WITH SURGEON ACCESS;  Surgeon: Ardis Hughs, MD;  Location: [facility];  Service: Urology;  Laterality: Left;  . OVARIAN CYST SURGERY    . PARATHYROIDECTOMY  02/26/2012   Procedure: PARATHYROIDECTOMY;  Surgeon: Ascencion Dike, MD;  Location: [facility];  Service: ENT;;  . Azzie [place] DILATION N/A 10/29/2016   Procedure: SAVORY DILATION;  Surgeon: Danie Binder, MD;  [facility];  Service: Endoscopy;  Laterality: N/A;  . [place] SURGERY  2011  . urological surgery for blocked ureter secondary to kidney stone      Vitals:   12/06/20 1142  BP: (!) 163/92  Pulse: 72      Subjective Assessment - 12/06/20 1059    Subjective Patient reports her head stays in "swimmy motion" all the time. It began about 4-6 months ago without known cause.  She denies any falls, but does stumble and has to hold onto things when she walks sometimes. Patient reports around the time the dizziness began she also had swelling in her lymph nodes, having a time with her diabetic medicine, and stomach problems. She feels that the dizziness has worsened. She reports there is no particular motion that causes her dizziness, she can just be sitting and having dizziness. She reports having nausea when first eating/drinking that settles quickly, which began when she started noticing dizziness. She reports the she gets head discomfort, but would not describe it as a headache from the frontal bone to suboccipitals. She denies any falls in the past 6 months. Patient reports her vision is not as clear in the Lt eye, but is unsure if this began before/after dizziness began. She denies vomitting.    Pertinent History See PMH above    How long can you sit comfortably? dizziness can occur in sitting    How long can you stand comfortably? dizziness can occur in standing    How long can you walk comfortably? walking throws me off balance    Diagnostic tests IMPRESSION:  No acute or reversible finding. No specific cause of the presenting  symptoms is identified. Mild to moderate chronic small-vessel  ischemic changes of the pons and cerebral hemispheric white matter.  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    Patient Stated Goals how to handle/adjust my balance    Currently in Pain? No/denies              [facility] PT Assessment - 12/06/20 0001      Assessment    Medical Diagnosis Dizzy spells  R26.89 (ICD-10-CM) - Balance problem    Referring Provider (PT) [name] F    Onset Date/Surgical Date --   4-6 months ago   Hand Dominance Right    Next MD Visit April 2022    Prior Therapy no      Precautions   Precautions Fall  Restrictions   Weight Bearing Restrictions No      Balance Screen   Has the patient fallen in the past 6 months No      [place] residence    Living Arrangements Spouse/significant other;Children    Additional Comments stairs      Prior Function   Level of [place] Retired      Associate Professor   Overall Cognitive Status Within Functional Limits for tasks assessed      Observation/Other Assessments   Focus on Therapeutic Outcomes (FOTO)  did not capture at eval    Other Surveys  Dizziness Handicap Inventory (DHI)    Dizziness Handicap Inventory [facility])  54      Special Tests    Special Tests Cervical    Other special tests [place] negative   VOMS:baseline dizziness 1 and nausea 2 (nausea remaing at 2 throughout testing) smooth pursuit dizziness 5; horizontal saccades dizzines 7; vertical saccades dizziness 1; convergence dizziness 5; horizontal and vertical VOR dizziness 1,visual motion LOB   Cervical Tests Vertebral Artery Test      Vertebral Artery Test    Findings Negative    Side --   bilateral     Ambulation/Gait   Gait Comments patient must hold onto wall when walking due to dizziness                      Objective measurements completed on examination: See above findings.       [facility] Adult PT Treatment/Exercise - 12/06/20 0001      Self-Care   Self-Care Other Self-Care Comments    Other Self-Care Comments  see patient education                  PT Education - 12/06/20 1411    Education Details Education on assessment findings and plans to continue care with neuro PT.    Person(s) Educated Patient     Methods Explanation    Comprehension Verbalized understanding            PT Short Term Goals - 12/06/20 1109      PT SHORT TERM GOAL #1   Title Patient will be independent with initial HEP.    Baseline No time at eval to issue HEP.    Time 2    Period Weeks    Status New    Target Date 01/03/21      PT SHORT TERM GOAL #2   Title Patient will improve convergence by at least 5 cm to improve ability to complete reading activity.    Baseline 33 cm    Time 4    Period Weeks    Status New    Target Date 01/03/21      PT SHORT TERM GOAL #3   Title Therapist will capture FOTO score and establish appropriate long term goal.    Baseline did not capture at eval.    Time 2    Period Weeks    Status New    Target Date 12/20/20             PT Long Term Goals - 12/06/20 1422      PT LONG TERM GOAL #1   Title Patient will score less than 34 on the [place].    Baseline 54    Time 8    Period Weeks    Status New    Target Date 01/31/21  PT LONG TERM GOAL #2   Title Patient will report no increased dizziness with smooth pursuit and horizontal saccades.    Baseline 5/10 with smooth pursuit and 7/10 with horizontal saccades.                  Plan - 12/06/20 1151    Clinical Impression Statement Patient reports to OPPT with chief complaint of dizziness that began about 4-6 months ago without known cause. She reports her dizziness does not have any aggravating factors as it can come and go throughout the day with different activities whether that be sitting, standing, walking, reading. Upon today's assessment her dizziness is increased with VOMS testing, though no nystagmus noted. She has a negative [employer], though becomes short of breath during testing procedure.Given patient's symptomology and findings upon today's assessment it is recommended that she continue physical therapy treatment with neuro PT at this time with goals and POC modified as needed per re-assessment  by neuro PT.    Personal Factors and Comorbidities Age;Time since onset of injury/illness/exacerbation    Examination-Activity Limitations Sit;Squat;Stand;Lift;Reach Overhead;Locomotion Level;Transfers;Bend    Examination-Participation Restrictions Cleaning;Community Activity;Driving;Laundry;Shop    Stability/Clinical Decision Making Unstable/Unpredictable    Clinical Decision Making High    Rehab Potential Fair    PT Frequency --   1-2/week   PT Duration 8 weeks    PT Treatment/Interventions ADLs/Self Care Home Management;Therapeutic activities;Therapeutic exercise;Balance training;Neuromuscular re-education;Patient/family education;Manual techniques;Vestibular    PT Next Visit Plan establish HEP, consider other BPPV testing, balance testing    Recommended Other Services transferring to neuro PT    Consulted and Agree with Plan of Care Patient           Patient will benefit from skilled therapeutic intervention in order to improve the following deficits and impairments:  Difficulty walking,Dizziness,Decreased activity tolerance,Decreased balance  Visit Diagnosis: Dizzy spells  Unsteadiness on feet     Problem List Patient Active Problem List   Diagnosis Date Noted  . 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 Thyroid nodule 01/16/2018  . Chronic sore throat 01/16/2018  . Overweight (BMI 25.0-29.9) 12/18/2017  . Diabetic neuropathy ([place]) 10/13/2017  . Genetic testing 11/28/2016  . Esophageal dysphagia 09/30/2016  . Ventral hernia 07/28/2015  . Situational anxiety 04/26/2015  . Thyromegaly 01/23/2015  . Rash and nonspecific skin eruption 01/23/2015  . Iron deficiency 06/17/2014  . Nephrolithiasis 04/13/2014  . Kidney stone 03/18/2014  . DDD (degenerative disc disease), lumbar 01/26/2014  . 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 Renal cyst 01/26/2014  . Back pain with left-sided sciatica 12/28/2013  . Uncontrolled type 2 diabetes mellitus with hyperglycemia,  without long-term current use of insulin ([place]) 08/01/2013  . Chronic fatigue 12/26/2009  . GASTROPARESIS 12/15/2008  . Esophageal reflux 06/21/2008  . IBS (irritable bowel syndrome) 06/21/2008  . 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 ([place]) 02/09/2008  . Hyperlipidemia 02/09/2008  . Essential hypertension 02/09/2008  . Insomnia 02/09/2008  Gwendolyn Grant, PT, DPT, ATC 12/06/20 2:53 PM  [facility] [address] [place], [place], [address] Phone: [phone]   Fax:  [phone]  Name: [name] MRN: [id number] Date of Birth: 06-13-1946

## 2020-12-20 ENCOUNTER — Telehealth: Payer: Self-pay | Admitting: Pharmacist

## 2020-12-20 DIAGNOSIS — E782 Mixed hyperlipidemia: Secondary | ICD-10-CM

## 2020-12-20 NOTE — Telephone Encounter (Signed)
Spoke with pt regarding missed lab work since starting rosuvastatin 5mg  daily. Pt states she stopped taking rosuvastatin due to leg pain that occurred within the first week of therapy. She reports prior intolerances to statins in the past - discussed trying low dose pravastatin but pt does not wish to take another statin. She is intolerant to ezetimibe and does not want to take injectable medication either. Nexletol is the only other med that pt would be able to try - will submit prior authorization.

## 2020-12-21 MED ORDER — NEXLETOL 180 MG PO TABS: 1.0000 | ORAL_TABLET | Freq: Every day | ORAL | 11 refills | Status: DC

## 2020-12-21 NOTE — Telephone Encounter (Signed)
Nexletol prior authorization approved through 09/22/21. Rx sent to pharmacy to determine copay - high at $100/month.

## 2020-12-21 NOTE — Telephone Encounter (Signed)
lmom for pt to call back to schedule lipid nd cmet in 3 months and to apply for [employer]

## 2020-12-25 NOTE — Addendum Note (Signed)
Addended by: Yanissa Michalsky E on: 12/25/2020 11:44 AM   Modules accepted: Orders

## 2020-12-25 NOTE — Telephone Encounter (Signed)
Called pt again. Successfully applied for [employer]. Info called to pt's pharmacy so that her Nexletol will be free. Scheduled follow up labs in 3 months to assess efficacy.

## 2020-12-26 ENCOUNTER — Ambulatory Visit: Payer: Medicare Other | Admitting: Internal Medicine

## 2020-12-26 ENCOUNTER — Encounter: Payer: Self-pay | Admitting: Internal Medicine

## 2020-12-26 ENCOUNTER — Other Ambulatory Visit: Payer: Self-pay

## 2020-12-26 VITALS — BP 162/82 | HR 78 | Temp 98.1°F | Resp 18 | Ht 66.5 in | Wt 184.1 lb

## 2020-12-26 DIAGNOSIS — M5136 Other intervertebral disc degeneration, lumbar region: Secondary | ICD-10-CM

## 2020-12-26 DIAGNOSIS — I10 Essential (primary) hypertension: Secondary | ICD-10-CM

## 2020-12-26 DIAGNOSIS — C50919 Malignant neoplasm of unspecified site of unspecified female breast: Secondary | ICD-10-CM

## 2020-12-26 DIAGNOSIS — K219 Gastro-esophageal reflux disease without esophagitis: Secondary | ICD-10-CM

## 2020-12-26 DIAGNOSIS — R2689 Other abnormalities of gait and mobility: Secondary | ICD-10-CM

## 2020-12-26 DIAGNOSIS — Z7689 Persons encountering health services in other specified circumstances: Secondary | ICD-10-CM

## 2020-12-26 DIAGNOSIS — E1165 Type 2 diabetes mellitus with hyperglycemia: Secondary | ICD-10-CM

## 2020-12-26 DIAGNOSIS — E782 Mixed hyperlipidemia: Secondary | ICD-10-CM

## 2020-12-26 DIAGNOSIS — Z170 Estrogen receptor positive status [ER+]: Secondary | ICD-10-CM

## 2020-12-26 DIAGNOSIS — R5382 Chronic fatigue, unspecified: Secondary | ICD-10-CM

## 2020-12-26 MED ORDER — AMLODIPINE BESYLATE 5 MG PO TABS: 5.0000 mg | ORAL_TABLET | Freq: Every day | ORAL | 0 refills | Status: DC

## 2020-12-26 NOTE — Patient Instructions (Signed)
Please start taking Amlodipine 5 mg QD.  Please participate in physical therapy as scheduled.  Please check blood glucose regularly and bring the log in the next visit.

## 2020-12-26 NOTE — Assessment & Plan Note (Signed)
With dizziness Planned to have Cardiology evaluation Referred to PT/Vestibular therapy

## 2020-12-26 NOTE — Assessment & Plan Note (Signed)
BP Readings from Last 1 Encounters:  12/26/20 (!) 162/82   Elevated today, but would avoid changing medication dose in the first visit Counseled for compliance with the medications Advised DASH diet and moderate exercise/walking, at least 150 mins/week

## 2020-12-26 NOTE — Assessment & Plan Note (Signed)
S/p right mastectomy and chemotherapy, completed Tamoxifen

## 2020-12-26 NOTE — Assessment & Plan Note (Signed)
On Pepcid 

## 2020-12-26 NOTE — Progress Notes (Signed)
New Patient Office Visit  Subjective:  Patient ID: [name], female    DOB: [birth year]  Age: 75 y.o. MRN: [id number]  CC:  Chief Complaint  Patient presents with  . New Patient (Initial Visit)    New patient has been getting dizzy and having nausea not feeling good this all has been going on for about 6 months     HPI [name] is a 75 year old female with PMH of HTN, DM, breast ca s/p right mastectomy and chemotherapy, HLD, DDD of lumbar spine and GERD who presents for establishing care.  She has been having dizzi spells for last 6 months. She has had CT of the neck and MRI of the brain, which did not show any causative defect. These episodes are intermittent and last for few seconds. She also has chronic fatigue and nausea during these episodes. They happen in the resting position as well. Denies tinnitus. Has baseline hearing difficulty and wears hearing aides.  Her BP was elevated today. She takes her medications regularly. She denies any headache, chest pain, dyspnea or palpitations.  She takes Metformin and Glimepiride for DM. Her HbA1C was 8.9. She denies polyuria or polydipsia. She did not tolerate statin in the past and is going to start Nexletol, sent by lipid clinic.  She is up-to-date with COVID vaccine.  Past Medical History:  Diagnosis Date  . Abdominal hernia 02/26/12   unrepaired  . 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 Anemia   . Anxiety   . Aortic atherosclerosis ([place])   . Arthritis   . Blood transfusion 1980's  . Breast cancer ([place])   . Cancer ([place])    Phreesia 08/18/2020  . Complication of anesthesia    has a hard time waking up; can't lay flat  . Degenerative disc disease, lumbar    pressing on L3 and L4  . Diabetes mellitus ([place]) 1989   Type 2 NIDDM; "cancer treatment gave me diabetes"  . Diabetes mellitus without complication ([place])    Phreesia 08/18/2020  . Diverticulosis   . DJD (degenerative  joint disease) of lumbar spine   . Dysrhythmia    "irregular"  . Exertional dyspnea   . Gastroparesis   . GERD (gastroesophageal reflux disease)    mann  . Heart murmur    "think I outgrew it"  . Hepatic steatosis   . History of kidney stones   . History of stomach ulcers   . Hx: UTI (urinary tract infection)   . Hyperlipidemia   . Hypersensitivity    in tongue  . Hypertension   . IBS (irritable bowel syndrome)   . IBS (irritable bowel syndrome)   . Insomnia   . Internal hemorrhoids   . Kidney cysts    RT KIDNEY  . Kidney stones 2009   s/p lithotripsy  . Obesity   . PONV (postoperative nausea and vomiting)   . Pulmonary nodule   . Shortness of breath    unable to lie flat  . Ventral hernia     Past Surgical History:  Procedure Laterality Date  . ABDOMINAL HYSTERECTOMY  2002  . APPENDECTOMY    . BACK SURGERY     Can't take any medical procedure in right arm  . BREAST BIOPSY     right  . BREAST SURGERY N/A    Phreesia 04/22/2020  . CATARACT EXTRACTION W/ INTRAOCULAR LENS  IMPLANT, BILATERAL  2009-2010  .  CESAREAN SECTION  1973; 1978; 1981  . CESAREAN SECTION N/A    Phreesia 04/22/2020  . COLONOSCOPY WITH PROPOFOL N/A 10/29/2016   Procedure: COLONOSCOPY WITH PROPOFOL;  Surgeon: Danie Binder, MD;  [facility];  Service: Endoscopy;  Laterality: N/A;  10:00 am  . CYSTOSCOPY Left 04/13/2014   Procedure: CYSTOSCOPY FLEXIBLE;  Surgeon: Ardis Hughs, MD;  Location: [facility];  Service: Urology;  Laterality: Left;  with STENT  . ESOPHAGOGASTRODUODENOSCOPY (EGD) WITH PROPOFOL N/A 10/29/2016   Procedure: ESOPHAGOGASTRODUODENOSCOPY (EGD) WITH PROPOFOL;  Surgeon: Danie Binder, MD;  [facility];  Service: Endoscopy;  Laterality: N/A;  . EYE SURGERY    . implant and screws put in the right lower jaw  11/2008   dental surgery  . kidney blockage  ?1990's   " major surgery ;put kidney on pump for awhile"  . LITHOTRIPSY     "several times"  . LUMBAR  FUSION  08/2010  . MASTECTOMY  1997   right  . NEPHROLITHOTOMY Left 04/13/2014   Procedure: LEFT PERCUTANEOUS NEPHROLITHOTOMY WITH SURGEON ACCESS;  Surgeon: Ardis Hughs, MD;  Location: [facility];  Service: Urology;  Laterality: Left;  . OVARIAN CYST SURGERY    . PARATHYROIDECTOMY  02/26/2012   Procedure: PARATHYROIDECTOMY;  Surgeon: Ascencion Dike, MD;  Location: [facility];  Service: ENT;;  . Azzie Almas DILATION N/A 10/29/2016   Procedure: SAVORY DILATION;  Surgeon: Danie Binder, MD;  [facility]: [facility];  Service: Endoscopy;  Laterality: N/A;  . [place] SURGERY  2011  . urological surgery for blocked ureter secondary to kidney stone      Family History  Problem Relation Age of Onset  . Heart attack Mother   . Cancer Brother        Esophageal; smoker; deceased 44s  . Colon cancer Brother   . Diabetes Daughter   . Leukemia Paternal Aunt        deceased 77s  . Pancreatic cancer Paternal Aunt        deceased 14  . Cancer Paternal Aunt        GI cancer; deceased 47s  . Cancer Cousin        kidney cancer; deceased 23; pat first cousin; daughter of aunt w/ leukemia  . Cancer Cousin        colon cancer @ 80; pat first cousin; son of aunt with GI cancer  . Anesthesia problems Neg Hx     Social History   Socioeconomic History  . Marital status: Married    Spouse name: Not on file  . Number of children: 3  . Years of education: Not on file  . Highest education level: Not on file  Occupational History  . Occupation: retired in 2011  Tobacco Use  . Smoking status: Never Smoker  . Smokeless tobacco: Never Used  Vaping Use  . Vaping Use: Never used  Substance and Sexual Activity  . Alcohol use: Yes    Alcohol/week: 0.0 standard drinks    Comment: X2 DRINKS PER YEAR  . Drug use: No  . Sexual activity: Not Currently    Birth control/protection: Surgical  Other Topics Concern  . Not on file  Social History Narrative  . Not on file   Social Determinants of Health   Financial  Resource Strain: Low Risk   . Difficulty of Paying Living Expenses: Not very hard  Food Insecurity: Not on file  Transportation Needs: Not on file  Physical Activity: Not  on file  Stress: Not on file  Social Connections: Not on file  Intimate Partner Violence: Not on file    ROS Review of Systems  Constitutional: Negative for chills and fever.  HENT: Negative for congestion, sinus pressure, sinus pain and sore throat.   Eyes: Negative for pain and discharge.  Respiratory: Negative for cough and shortness of breath.   Cardiovascular: Negative for chest pain and palpitations.  Gastrointestinal: Negative for abdominal pain, constipation, diarrhea, nausea and vomiting.  Endocrine: Negative for polydipsia and polyuria.  Genitourinary: Negative for dysuria and hematuria.  Musculoskeletal: Negative for neck pain and neck stiffness.  Skin: Negative for rash.  Neurological: Positive for dizziness and weakness.  Psychiatric/Behavioral: Negative for agitation and behavioral problems.    Objective:   Today's Vitals: BP (!) 162/82 (BP Location: Right Arm, Patient Position: Sitting, Cuff Size: Normal)   Pulse 78   Temp 98.1 F (36.7 C) (Oral)   Resp 18   Ht 5' 6.5" (1.689 m)   Wt 184 lb 1.9 oz (83.5 kg)   SpO2 98%   BMI 29.27 kg/m   Physical Exam Vitals reviewed.  Constitutional:      General: She is not in acute distress.    Appearance: She is not diaphoretic.  HENT:     Head: Normocephalic and atraumatic.     Nose: Nose normal.     Mouth/Throat:     Mouth: Mucous membranes are moist.  Eyes:     General: No scleral icterus.    Extraocular Movements: Extraocular movements intact.     Pupils: Pupils are equal, round, and reactive to light.  Cardiovascular:     Rate and Rhythm: Normal rate and regular rhythm.     Pulses: Normal pulses.     Heart sounds: Normal heart sounds. No murmur heard.   Pulmonary:     Breath sounds: Normal breath sounds. No wheezing or rales.   Abdominal:     Palpations: Abdomen is soft.     Tenderness: There is no abdominal tenderness.  Musculoskeletal:     Cervical back: Neck supple. No tenderness.     Right lower leg: No edema.     Left lower leg: No edema.  Skin:    General: Skin is warm.     Findings: No rash.  Neurological:     General: No focal deficit present.     Mental Status: She is alert and oriented to person, place, and time.  Psychiatric:        Mood and Affect: Mood normal.        Behavior: Behavior normal.     Assessment & Plan:   Problem List Items Addressed This Visit      Encounter to establish care    -  Primary Care established Previous chart reviewed History and medications reviewed with the patient  Relevant Orders  CBC with Differential  Basic Metabolic Panel (BMET)    Cardiovascular and Mediastinum   Essential hypertension    BP Readings from Last 1 Encounters:  12/26/20 (!) 162/82   Elevated today, but would avoid changing medication dose in the first visit Counseled for compliance with the medications Advised DASH diet and moderate exercise/walking, at least 150 mins/week       Relevant Medications   amLODipine (NORVASC) 5 MG tablet   Other Relevant Orders   CBC with Differential     Digestive   Esophageal reflux    On Pepcid        Endocrine  Uncontrolled type 2 diabetes mellitus with hyperglycemia, without long-term current use of insulin (HCC)    Lab Results  Component Value Date   HGBA1C 8.6 (H) 10/25/2020   On Metformin and Glimepiride Did not tolerate GLP-1 agonist in the past Advised to follow diabetic diet F/u CMP and lipid panel Diabetic eye exam: Advised to follow up with Ophthalmology for diabetic eye exam       Relevant Orders   Basic Metabolic Panel (BMET)   Hemoglobin A1c     Musculoskeletal and Integument   DDD (degenerative disc disease), lumbar    Tylenol PRN        Other   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 ([place])    S/p right  mastectomy and chemotherapy, completed Tamoxifen      Hyperlipidemia    Follows up with lipid clinic Going to start Nexletol      Relevant Medications   amLODipine (NORVASC) 5 MG tablet   Balance problem    With dizziness Planned to have Cardiology evaluation Referred to PT/Vestibular therapy      Relevant Orders   Ambulatory referral to Physical Therapy    Other Visit Diagnoses    Chronic fatigue       Relevant Orders   TSH + free T4   Cortisol      Outpatient Encounter Medications as of 12/26/2020  Medication Sig  . acetaminophen (TYLENOL) 325 MG tablet Take 325 mg by mouth every 6 (six) hours as needed for mild pain.   Marland Kitchen amLODipine (NORVASC) 5 MG tablet Take 1 tablet (5 mg total) by mouth daily.  . Blood Glucose Monitoring Suppl (BLOOD GLUCOSE SYSTEM PAK) KIT Use as directed to monitor FSBS 1x daily. Dx: E11.9  . Cholecalciferol (VITAMIN D3 PO) Take 1 tablet by mouth daily.  Marland Kitchen CINNAMON PO Take 1 capsule by mouth. Takes sometimes  . famotidine (PEPCID) 20 MG tablet TAKE ONE TABLET BY MOUTH TWICE DAILY.  . furosemide (LASIX) 40 MG tablet Take 1 tablet (40 mg total) by mouth every other day. (Patient taking differently: Take 40 mg by mouth as needed.)  . glimepiride (AMARYL) 4 MG tablet Take 1 tablet (4 mg total) by mouth 2 (two) times daily.  Marland Kitchen glucose blood (ACCU-CHEK GUIDE) test strip 1 each by Other route as needed for other. Use as instructed to check blood sugar daily.  Marland Kitchen LANCETS SUPER THIN 28G MISC Use to check blood sugar once daily.  . metFORMIN (GLUCOPHAGE) 500 MG tablet TAKE ONE TABLET BY MOUTH 2 TIMES A DAY WITH A MEAL  . Multiple Vitamin (MULTIVITAMIN) tablet Take 1 tablet by mouth daily.  . Nebivolol HCl (BYSTOLIC) 20 MG TABS Take 1 tablet (20 mg total) by mouth 2 (two) times daily.  . potassium chloride (K-DUR) 10 MEQ tablet Take 1 tablet (10 mEq total) by mouth every other day. (Patient taking differently: Take 10 mEq by mouth every other day.)  .  tetrahydrozoline-zinc (VISINE-AC) 0.05-0.25 % ophthalmic solution Place 2 drops into both eyes 3 (three) times daily as needed (dry eyes).  . Bempedoic Acid (NEXLETOL) 180 MG TABS Take 1 tablet by mouth daily. (Patient not taking: Reported on 12/26/2020)   No facility-administered encounter medications on file as of 12/26/2020.    Follow-up: Return in about 4 months (around 04/27/2021) for HTN and DM.   Lindell Spar, MD

## 2020-12-26 NOTE — Assessment & Plan Note (Signed)
Lab Results  Component Value Date   HGBA1C 8.6 (H) 10/25/2020   On Metformin and Glimepiride Did not tolerate GLP-1 agonist in the past Advised to follow diabetic diet F/u CMP and lipid panel Diabetic eye exam: Advised to follow up with Ophthalmology for diabetic eye exam

## 2020-12-26 NOTE — Assessment & Plan Note (Signed)
Tylenol PRN

## 2020-12-26 NOTE — Assessment & Plan Note (Signed)
Follows up with lipid clinic Going to start Nexletol

## 2021-01-01 ENCOUNTER — Other Ambulatory Visit: Payer: Self-pay | Admitting: Internal Medicine

## 2021-01-01 ENCOUNTER — Encounter: Payer: Self-pay | Admitting: *Deleted

## 2021-01-01 DIAGNOSIS — E1165 Type 2 diabetes mellitus with hyperglycemia: Secondary | ICD-10-CM

## 2021-01-01 MED ORDER — GLIPIZIDE 10 MG PO TABS: 10.0000 mg | ORAL_TABLET | Freq: Two times a day (BID) | ORAL | 3 refills | Status: DC

## 2021-01-17 ENCOUNTER — Encounter (HOSPITAL_COMMUNITY): Payer: Self-pay | Admitting: Physical Therapy

## 2021-01-17 ENCOUNTER — Other Ambulatory Visit: Payer: Self-pay

## 2021-01-17 ENCOUNTER — Ambulatory Visit (HOSPITAL_COMMUNITY): Payer: Medicare Other | Attending: Internal Medicine | Admitting: Physical Therapy

## 2021-01-17 DIAGNOSIS — R42 Dizziness and giddiness: Secondary | ICD-10-CM | POA: Insufficient documentation

## 2021-01-17 DIAGNOSIS — R2681 Unsteadiness on feet: Secondary | ICD-10-CM | POA: Diagnosis present

## 2021-01-17 DIAGNOSIS — R2689 Other abnormalities of gait and mobility: Secondary | ICD-10-CM | POA: Diagnosis not present

## 2021-01-17 NOTE — Therapy (Signed)
[place], [place], [address] Phone: [phone]   Fax:  [phone]  Physical Therapy Evaluation  Patient Details  Name: [name] MRN: [id number] Date of Birth: 1946/06/30 Referring Provider (PT): Ihor Dow MD   Encounter Date: 01/17/2021   PT End of Session - 01/17/21 1105    Visit Number 1    Number of Visits 12    Date for PT Re-Evaluation 02/28/21    Authorization Type [employer]    Progress Note Due on Visit 10    PT Start Time 1000    PT Stop Time 1053    PT Time Calculation (min) 53 min    Activity Tolerance Patient tolerated treatment well;Patient limited by fatigue;Other (comment)   limited by c/o symptoms   Behavior During Therapy [facility] for tasks assessed/performed           Past Medical History:  Diagnosis Date  . Abdominal hernia 02/26/12   unrepaired  . 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 Anemia   . Anxiety   . Aortic atherosclerosis ([place])   . Arthritis   . Blood transfusion 1980's  . Breast cancer ([place])   . Cancer ([place])    Phreesia 08/18/2020  . Complication of anesthesia    has a hard time waking up; can't lay flat  . Degenerative disc disease, lumbar    pressing on L3 and L4  . Diabetes mellitus ([place]) 1989   Type 2 NIDDM; "cancer treatment gave me diabetes"  . Diabetes mellitus without complication ([place])    Phreesia 08/18/2020  . Diverticulosis   . DJD (degenerative joint disease) of lumbar spine   . Dysrhythmia    "irregular"  . Exertional dyspnea   . Gastroparesis   . GERD (gastroesophageal reflux disease)    mann  . Heart murmur    "think I outgrew it"  . Hepatic steatosis   . History of kidney stones   . History of stomach ulcers   . Hx: UTI (urinary tract infection)   . Hyperlipidemia   . Hypersensitivity    in tongue  . Hypertension   . IBS (irritable bowel syndrome)   . IBS (irritable bowel  syndrome)   . Insomnia   . Internal hemorrhoids   . Kidney cysts    RT KIDNEY  . Kidney stones 2009   s/p lithotripsy  . Obesity   . PONV (postoperative nausea and vomiting)   . Pulmonary nodule   . Shortness of breath    unable to lie flat  . Ventral hernia     Past Surgical History:  Procedure Laterality Date  . ABDOMINAL HYSTERECTOMY  2002  . APPENDECTOMY    . BACK SURGERY     Can't take any medical procedure in right arm  . BREAST BIOPSY     right  . BREAST SURGERY N/A    Phreesia 04/22/2020  . CATARACT EXTRACTION W/ INTRAOCULAR LENS  IMPLANT, BILATERAL  2009-2010  . CESAREAN SECTION  1973; 1978; 1981  . CESAREAN SECTION N/A    Phreesia 04/22/2020  . COLONOSCOPY WITH PROPOFOL N/A 10/29/2016   Procedure: COLONOSCOPY WITH PROPOFOL;  Surgeon: Danie Binder, MD;  [facility];  Service: Endoscopy;  Laterality: N/A;  10:00 am  . CYSTOSCOPY Left 04/13/2014   Procedure: CYSTOSCOPY FLEXIBLE;  Surgeon: Ardis Hughs, MD;  Location: [facility];  Service: Urology;  Laterality: Left;  with STENT  . ESOPHAGOGASTRODUODENOSCOPY (EGD) WITH PROPOFOL N/A 10/29/2016   Procedure: ESOPHAGOGASTRODUODENOSCOPY (EGD) WITH PROPOFOL;  Surgeon: Danie Binder, MD;  [facility];  Service: Endoscopy;  Laterality: N/A;  . EYE SURGERY    . implant and screws put in the right lower jaw  11/2008   dental surgery  . kidney blockage  ?1990's   " major surgery ;put kidney on pump for awhile"  . LITHOTRIPSY     "several times"  . LUMBAR FUSION  08/2010  . MASTECTOMY  1997   right  . NEPHROLITHOTOMY Left 04/13/2014   Procedure: LEFT PERCUTANEOUS NEPHROLITHOTOMY WITH SURGEON ACCESS;  Surgeon: Ardis Hughs, MD;  Location: [facility];  Service: Urology;  Laterality: Left;  . OVARIAN CYST SURGERY    . PARATHYROIDECTOMY  02/26/2012   Procedure: PARATHYROIDECTOMY;  Surgeon: Ascencion Dike, MD;  Location: [facility];  Service: ENT;;  . Azzie Almas DILATION N/A 10/29/2016   Procedure: SAVORY DILATION;   Surgeon: Danie Binder, MD;  [facility];  Service: Endoscopy;  Laterality: N/A;  . [place] SURGERY  2011  . urological surgery for blocked ureter secondary to kidney stone      There were no vitals filed for this visit.    Subjective Assessment - 01/17/21 1008    Subjective Patient is a 75 y.o. female who presents to physical therapy with c/o dizzy spells and balance deficits. Patient states symptoms began about 4-5 months ago with insidious onset. She states impaired balance. She fell in the shower hitting her head about 10 years. She notes feeling tired and off balance or off balance then tired. She states her L ear always feels like something in the left ear. She gets soreness behind her ear lobes which is worse on her left side. She has a sore nodule below her ear. Symptoms last more than a minute typically and can last from a few minutes to most of the day. She has to sit when symptoms come on. She typically just lets it wear off when she becomes too swimmy. She has had medication changes in last 6 months. She is not sure if its pill related. Her main goal is to improve symptoms.    Pertinent History neuropathy, hx cancer, DM, back surgery, kidney problems    Limitations House hold activities;Standing;Walking    Patient Stated Goals improve symptoms    Currently in Pain? No/denies              [facility] PT Assessment - 01/17/21 0001      Assessment   Medical Diagnosis Balance Problems, Dizzy spells    Referring Provider (PT) Ihor Dow MD    Onset Date/Surgical Date 08/19/20    Hand Dominance Right    Next MD Visit Summer    Prior Therapy evaluated in March      Precautions   Precautions Fall      Restrictions   Weight Bearing Restrictions No      Balance Screen   Has the patient fallen in the past 6 months No    Has the patient had a decrease in activity level because of a fear of falling?  No    Is the patient reluctant to leave their home because of a fear of  falling?  No      Home Social worker Private residence    Living Arrangements Spouse/significant other;Children      Prior Function   Level  of Independence Independent    Vocation Retired      Charity fundraiser Status Within Functional Limits for tasks assessed      Observation/Other Assessments   Observations Ambulates without AD    Focus on Therapeutic Outcomes (FOTO)  not loaded      Sensation   Light Touch Appears Intact    Additional Comments increased RLE L5      ROM / Strength   AROM / PROM / Strength AROM;Strength      AROM   Overall AROM Comments cervical spine WFL, no change in symptoms. soreness inferior and posterior to ears      Strength   Strength Assessment Site Hip;Knee;Ankle    Right/Left Hip Right;Left    Right Hip Flexion 3+/5    Left Hip Flexion 4-/5    Right/Left Knee Right;Left    Right Knee Flexion 4/5    Right Knee Extension 4/5    Left Knee Flexion 4+/5    Left Knee Extension 4+/5    Right/Left Ankle Right;Left    Right Ankle Dorsiflexion 4+/5    Left Ankle Dorsiflexion 4+/5      Palpation   Palpation comment TTP nodule L c/sp inferior to ear lobe posterior to manable, greater tenderness and size in L side but present R as well, minimal tenderness in SCM and scalenes, no tenderness in posterior c/sp, no tender or swollen lymph notes in anterior c/sp; patient states wavy feeling with palpation of lateral nodules      Special Tests   Other special tests smooth pursits increases dizziness for about 30 seconds, overshot several times    Cervical Tests Vertebral Artery Test      Vertebral Artery Test    Findings Negative    Comment bilateral      Transfers   Comments slow, labored with use of hands      Standardized Balance Assessment   Standardized Balance Assessment Dynamic Gait Index      Dynamic Gait Index   Level Surface Mild Impairment    Change in Gait Speed Moderate Impairment    Gait with  Horizontal Head Turns Moderate Impairment    Gait with Vertical Head Turns Moderate Impairment    Gait and Pivot Turn Mild Impairment    Step Over Obstacle Moderate Impairment    Step Around Obstacles Moderate Impairment    Steps Moderate Impairment    Total Score 10    DGI comment: unsteady throughout, intermittent symptoms increases during, greatest increase in symptoms with head turns vertical and lateral, increased risk for falls, uses L foot to go up stairs, R foot to lower down                      Objective measurements completed on examination: See above findings.               PT Education - 01/17/21 1010    Education Details Patient educated on exam findings, POC, scope of PT    Person(s) Educated Patient    Methods Explanation;Demonstration    Comprehension Verbalized understanding;Returned demonstration            PT Short Term Goals - 01/17/21 1121      PT SHORT TERM GOAL #1   Title Patient will be independent with HEP in order to improve functional outcomes.    Time 3    Period Weeks    Status New    Target Date 02/07/21  PT SHORT TERM GOAL #2   Title Patient will report at least 25% improvement in symptoms for improved quality of life.    Time 3    Period Weeks    Status New    Target Date 02/07/21             PT Long Term Goals - 01/17/21 1124      PT LONG TERM GOAL #1   Title Patient will report at least 75% improvement in symptoms for improved quality of life.    Time 6    Period Weeks    Status New    Target Date 02/28/21      PT LONG TERM GOAL #2   Title Patient will report no increased dizziness with smooth pursuit.    Time 6    Period Weeks    Status New    Target Date 02/28/21      PT LONG TERM GOAL #3   Title Patient will score at least 16/24 on DGI for improved ability to ambulate in the community.    Time 6    Period Weeks    Status New    Target Date 02/28/21      PT LONG TERM GOAL #4   Title  Patient will be able to complete 5x STS in under 11.4 seconds in order to reduce the risk of falls.    Time 6    Period Weeks    Status New    Target Date 02/28/21                  Plan - 01/17/21 1113    Clinical Impression Statement Patient is a 75 y.o. female who presents to physical therapy with c/o dizzy spells and balance deficits which began about 5 months ago with insidious onset. Patient also states onset of swollen gland in cervical spine and changes in medications which may be contributing to symptoms. Symptoms are not changed with positional changes. Patient with several bouts of symptoms during evaluation which resolve rather quickly today. Patient demonstrates impaired static and dynamic balance today as well as impaired strength. She presents with deficits in bilateral LE strength, gait balance, dizziness, and functional mobility with ADL. She is having to modify and restrict ADL as indicated by DGI score as well as subjective information and objective measures which is affecting overall participation. Patient will benefit from skilled physical therapy in order to improve function and reduce impairment.    Personal Factors and Comorbidities Age;Time since onset of injury/illness/exacerbation;Comorbidity 3+;Fitness;Behavior Pattern    Comorbidities DM, neuropathy, hx back pain and surgery, hx cancer    Examination-Activity Limitations Sit;Squat;Stand;Lift;Reach Overhead;Locomotion Level;Transfers;Bend    Examination-Participation Restrictions Cleaning;Community Activity;Driving;Laundry;Shop;Meal Prep;Volunteer;Yard Work    Stability/Clinical Decision Making Unstable/Unpredictable    Surveyor, mining    Rehab Potential Fair    PT Frequency 2x / week    PT Duration 6 weeks    PT Treatment/Interventions ADLs/Self Care Home Management;Therapeutic activities;Therapeutic exercise;Balance training;Neuromuscular re-education;Patient/family education;Manual  techniques;Vestibular;Aquatic Therapy;Cryotherapy;Electrical Stimulation;Iontophoresis 4mg /ml Dexamethasone;Moist Heat;Traction;Ultrasound;Parrafin;DME Instruction;Gait training;Stair training;Functional mobility training;Orthotic Fit/Training;Manual lymph drainage;Compression bandaging;Scar mobilization;Passive range of motion;Dry needling;Energy conservation;Splinting;Spinal Manipulations;Visual/perceptual remediation/compensation;Joint Manipulations    PT Next Visit Plan possibly further assess vestibular/symptoms, begin balance, gait, strength training    Consulted and Agree with Plan of Care Patient           Patient will benefit from skilled therapeutic intervention in order to improve the following deficits and impairments:  Difficulty walking,Dizziness,Decreased activity tolerance,Decreased balance,Abnormal gait,Decreased  safety awareness,Improper body mechanics,Decreased mobility,Decreased strength,Decreased endurance  Visit Diagnosis: Other abnormalities of gait and mobility  Unsteadiness on feet  Dizzy spells     Problem List Patient Active Problem List   Diagnosis Date Noted  . Balance problem 12/26/2020  . 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 Thyroid nodule 01/16/2018  . Overweight (BMI 25.0-29.9) 12/18/2017  . Diabetic neuropathy ([place]) 10/13/2017  . Genetic testing 11/28/2016  . Esophageal dysphagia 09/30/2016  . Ventral hernia 07/28/2015  . Situational anxiety 04/26/2015  . Thyromegaly 01/23/2015  . Iron deficiency 06/17/2014  . Nephrolithiasis 04/13/2014  . DDD (degenerative disc disease), lumbar 01/26/2014  . 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 Renal cyst 01/26/2014  . Back pain with left-sided sciatica 12/28/2013  . Uncontrolled type 2 diabetes mellitus with hyperglycemia, without long-term current use of insulin ([place]) 08/01/2013  . GASTROPARESIS 12/15/2008  . Esophageal reflux 06/21/2008  . IBS (irritable bowel syndrome) 06/21/2008  .  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 ([place]) 02/09/2008  . Hyperlipidemia 02/09/2008  . Essential hypertension 02/09/2008  . Insomnia 02/09/2008    11:27 AM, 01/17/21 Mearl Latin PT, DPT Physical Therapist at [place], [place], [address] Phone: [phone]   Fax:  [phone]  Name: [name] MRN: [id number] Date of Birth: 1945-12-24

## 2021-01-23 ENCOUNTER — Telehealth (HOSPITAL_COMMUNITY): Payer: Self-pay | Admitting: Physical Therapy

## 2021-01-23 ENCOUNTER — Ambulatory Visit (HOSPITAL_COMMUNITY): Payer: Medicare Other | Attending: Internal Medicine | Admitting: Physical Therapy

## 2021-01-23 DIAGNOSIS — M8580 Other specified disorders of bone density and structure, unspecified site: Secondary | ICD-10-CM | POA: Insufficient documentation

## 2021-01-23 DIAGNOSIS — C50919 Malignant neoplasm of unspecified site of unspecified female breast: Secondary | ICD-10-CM | POA: Insufficient documentation

## 2021-01-23 DIAGNOSIS — Z170 Estrogen receptor positive status [ER+]: Secondary | ICD-10-CM | POA: Insufficient documentation

## 2021-01-23 NOTE — Telephone Encounter (Signed)
Called patient about missed visit this AM, no answer. Left VM and reminder of next visit scheduled 01/25/21 and to call with any issues.   5:19 PM, 01/23/21 Josue Hector PT DPT  Physical Therapist with [facility]  [phone]

## 2021-01-24 ENCOUNTER — Other Ambulatory Visit: Payer: Self-pay | Admitting: Internal Medicine

## 2021-01-25 ENCOUNTER — Ambulatory Visit (HOSPITAL_COMMUNITY): Payer: Medicare Other | Admitting: Physical Therapy

## 2021-01-26 ENCOUNTER — Telehealth (HOSPITAL_COMMUNITY): Payer: Self-pay | Admitting: Specialist

## 2021-01-26 NOTE — Telephone Encounter (Signed)
Mychart Message from patient:  am sorry that you are cancelling all my appointments. I had requested that my sessions start after Early Voting May 17 for I worked the polls as an Pharmacist, hospital.  I would not be available until after May 17. The June dates were ok.  Rehab manager responded to message asking for date she would like to resume treatments, will wait for email back and schedule her. Vangie Bicker, [place], OTR/L [phone]

## 2021-01-30 ENCOUNTER — Ambulatory Visit (HOSPITAL_COMMUNITY): Payer: Medicare Other | Admitting: Physical Therapy

## 2021-02-01 ENCOUNTER — Ambulatory Visit (HOSPITAL_COMMUNITY): Payer: Medicare Other | Admitting: Physical Therapy

## 2021-02-06 ENCOUNTER — Encounter (HOSPITAL_COMMUNITY): Payer: Medicare Other | Admitting: Physical Therapy

## 2021-02-07 ENCOUNTER — Ambulatory Visit (HOSPITAL_COMMUNITY): Payer: Medicare Other | Admitting: Physical Therapy

## 2021-02-07 ENCOUNTER — Other Ambulatory Visit (HOSPITAL_COMMUNITY): Payer: Self-pay

## 2021-02-07 DIAGNOSIS — C50919 Malignant neoplasm of unspecified site of unspecified female breast: Secondary | ICD-10-CM

## 2021-02-07 DIAGNOSIS — Z170 Estrogen receptor positive status [ER+]: Secondary | ICD-10-CM

## 2021-02-07 DIAGNOSIS — M8580 Other specified disorders of bone density and structure, unspecified site: Secondary | ICD-10-CM

## 2021-02-08 ENCOUNTER — Ambulatory Visit (HOSPITAL_COMMUNITY)
Admission: RE | Admit: 2021-02-08 | Discharge: 2021-02-08 | Disposition: A | Discharge status: Home / Self Care | Payer: Medicare Other | Source: Ambulatory Visit | Attending: Nurse Practitioner | Admitting: Nurse Practitioner

## 2021-02-08 ENCOUNTER — Inpatient Hospital Stay (HOSPITAL_COMMUNITY): Payer: Medicare Other | Attending: Hematology

## 2021-02-08 ENCOUNTER — Ambulatory Visit (HOSPITAL_COMMUNITY): Payer: Medicare Other | Admitting: Physical Therapy

## 2021-02-08 DIAGNOSIS — Z780 Asymptomatic menopausal state: Secondary | ICD-10-CM | POA: Insufficient documentation

## 2021-02-08 DIAGNOSIS — Z13820 Encounter for screening for osteoporosis: Secondary | ICD-10-CM | POA: Insufficient documentation

## 2021-02-08 DIAGNOSIS — Z1231 Encounter for screening mammogram for malignant neoplasm of breast: Secondary | ICD-10-CM | POA: Insufficient documentation

## 2021-02-08 DIAGNOSIS — M8589 Other specified disorders of bone density and structure, multiple sites: Secondary | ICD-10-CM | POA: Diagnosis not present

## 2021-02-08 DIAGNOSIS — Z1239 Encounter for other screening for malignant neoplasm of breast: Secondary | ICD-10-CM

## 2021-02-08 DIAGNOSIS — Z853 Personal history of malignant neoplasm of breast: Secondary | ICD-10-CM | POA: Insufficient documentation

## 2021-02-08 DIAGNOSIS — M8580 Other specified disorders of bone density and structure, unspecified site: Secondary | ICD-10-CM

## 2021-02-13 ENCOUNTER — Encounter (HOSPITAL_COMMUNITY): Payer: Medicare Other | Admitting: Physical Therapy

## 2021-02-14 NOTE — Progress Notes (Signed)
[facility] [address], [place] [address]   Patient Care Team: Lindell Spar, MD as PCP - General (Internal Medicine) Fay Records, MD as PCP - Cardiology (Cardiology) Ned Grace, DDS (Inactive) as Consulting Physician (Dentistry) Gean Birchwood, DPM as Consulting Physician (Podiatry) Lora Havens, MD (Ophthalmology) Leta Baptist, MD as Attending Physician (Otolaryngology) Edythe Clarity, [facility] as Pharmacist (Pharmacist)  SUMMARY OF ONCOLOGIC HISTORY: Oncology History  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 [facility])  02/09/2008 Initial Diagnosis   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 ([place])   07/06/2014 Genetic Testing   Testing was normal and did not reveal a mutation in these genes. The genes tested were ATM, BARD1, BRCA1, BRCA2, BRIP1, CDH1, CHEK2, MRE11A, MUTYH, NBN, NF1, PALB2, PTEN, RAD50, RAD51C, RAD51D, and TP53.     CHIEF COMPLIANT: Follow-up for Stage IA (T1cN0M0) right breast cancer, ER+/PR+ (diagnosed in 1997)   INTERVAL HISTORY: [name] is a 75 y.o. female here today for follow up of her Stage IA (T1cN0M0) right breast cancer, ER+/PR+ (diagnosed in 1997). Her last visit was on 02/10/2020.   Today she reports feeling okay. She reports pain in her jaw and behind her ears when she touches that area; that pain has lasted for a couple months. She has a history of kidney stones. She reports that she has increased sensitivity in her mouth following chemo; she has sensitivity to spice and carbonation. She spends most of her time at home.   REVIEW OF SYSTEMS:   Review of Systems  Constitutional: Positive for appetite change (50%) and fatigue (50%).  Neurological: Positive for dizziness.  All other systems reviewed and are negative.   I have reviewed the past medical history, past surgical history, social history and family history with the patient and they are unchanged from previous note.   ALLERGIES:   is allergic to demerol  [meperidine], bentyl [dicyclomine hcl], invokana [canagliflozin], metformin and related, protonix [pantoprazole sodium], statins, and ace inhibitors.   MEDICATIONS:  Current Outpatient Medications  Medication Sig Dispense Refill  . acetaminophen (TYLENOL) 325 MG tablet Take 325 mg by mouth every 6 (six) hours as needed for mild pain.     Marland Kitchen amLODipine (NORVASC) 5 MG tablet Take 1 tablet (5 mg total) by mouth daily. 90 tablet 0  . Bempedoic Acid (NEXLETOL) 180 MG TABS Take 1 tablet by mouth daily. (Patient not taking: Reported on 12/26/2020) 30 tablet 11  . Blood Glucose Monitoring Suppl (BLOOD GLUCOSE SYSTEM PAK) KIT Use as directed to monitor FSBS 1x daily. Dx: E11.9 1 each 1  . BYSTOLIC 20 MG TABS TAKE 1 TABLET BY MOUTH TWICE DAILY. 90 tablet 1  . Cholecalciferol (VITAMIN D3 PO) Take 1 tablet by mouth daily.    Marland Kitchen CINNAMON PO Take 1 capsule by mouth. Takes sometimes    . famotidine (PEPCID) 20 MG tablet TAKE ONE TABLET BY MOUTH TWICE DAILY. 60 tablet 2  . furosemide (LASIX) 40 MG tablet Take 1 tablet (40 mg total) by mouth every other day. (Patient taking differently: Take 40 mg by mouth as needed.) 45 tablet 3  . glipiZIDE (GLUCOTROL) 10 MG tablet Take 1 tablet (10 mg total) by mouth 2 (two) times daily before a meal. 60 tablet 3  . glucose blood (ACCU-CHEK GUIDE) test strip 1 each by Other route as needed for other. Use as instructed to check blood sugar daily. 100 each 3  . LANCETS SUPER THIN 28G MISC Use to check blood sugar  once daily. 100 each 3  . metFORMIN (GLUCOPHAGE) 500 MG tablet TAKE ONE TABLET BY MOUTH 2 TIMES A DAY WITH A MEAL 60 tablet 6  . Multiple Vitamin (MULTIVITAMIN) tablet Take 1 tablet by mouth daily.    . potassium chloride (K-DUR) 10 MEQ tablet Take 1 tablet (10 mEq total) by mouth every other day. (Patient taking differently: Take 10 mEq by mouth every other day.) 45 tablet 3  . tetrahydrozoline-zinc (VISINE-AC) 0.05-0.25 % ophthalmic solution Place 2 drops into both eyes  3 (three) times daily as needed (dry eyes).     No current facility-administered medications for this visit.     PHYSICAL EXAMINATION: Performance status (ECOG): 1 - Symptomatic but completely ambulatory  There were no vitals filed for this visit. Wt Readings from Last 3 Encounters:  12/26/20 184 lb 1.9 oz (83.5 kg)  10/25/20 182 lb (82.6 kg)  08/21/20 185 lb (83.9 kg)   Physical Exam Vitals reviewed.  Constitutional:      Appearance: Normal appearance.  HENT:     Mouth/Throat:     Tongue: No lesions.  Cardiovascular:     Rate and Rhythm: Normal rate and regular rhythm.     Pulses: Normal pulses.     Heart sounds: Normal heart sounds.  Pulmonary:     Effort: Pulmonary effort is normal.     Breath sounds: Normal breath sounds.  Chest:  Breasts:     Right: Absent. No inverted nipple, mass, nipple discharge or skin change (mastectomy site within normal limits).     Left: Normal. No inverted nipple, mass, nipple discharge or skin change.    Abdominal:     Palpations: Abdomen is soft. There is no hepatomegaly, splenomegaly or mass.     Hernia: A hernia is present. Hernia is present in the ventral area.  Neurological:     General: No focal deficit present.     Mental Status: She is alert and oriented to person, place, and time.  Psychiatric:        Mood and Affect: Mood normal.        Behavior: Behavior normal.     Breast Exam Chaperone: Kirstyn Evans     LABORATORY DATA:  I have reviewed the data as listed CMP Latest Ref Rng & Units 10/25/2020 08/21/2020 02/23/2020  Glucose 65 - 99 mg/dL 245(H) 178(H) 246(H)  BUN 7 - 25 mg/dL 14 10 8  Creatinine 0.60 - 0.93 mg/dL 1.07(H) 1.00(H) 1.15  Sodium 135 - 146 mmol/L 139 141 138  Potassium 3.5 - 5.3 mmol/L 3.7 3.7 3.7  Chloride 98 - 110 mmol/L 105 105 103  CO2 20 - 32 mmol/L 25 27 28  Calcium 8.6 - 10.4 mg/dL 9.2 9.4 9.4  Total Protein 6.1 - 8.1 g/dL 7.1 7.1 -  Total Bilirubin 0.2 - 1.2 mg/dL 0.4 0.6 -  Alkaline Phos 39 -  117 U/L - - -  AST 10 - 35 U/L 19 20 -  ALT 6 - 29 U/L 14 15 -   No results found for: CAN153 Lab Results  Component Value Date   WBC 10.3 10/25/2020   HGB 11.4 (L) 10/25/2020   HCT 34.9 (L) 10/25/2020   MCV 79.0 (L) 10/25/2020   PLT 204 10/25/2020   NEUTROABS 6,170 10/25/2020    ASSESSMENT:  1.  Stage Ia (T1cN0M0) right breast cancer, ER positive/PR positive: -Patient was diagnosed with right breast cancer in 1997. -Right breast cancer measuring 1.1 x 1.2 cm, grade 2, status post right   mastectomy with regional axillary lymph node dissection on 10/17/1995; all 8 axillary lymph nodes were negative for metastatic disease. -She had adjuvant chemotherapy consisting of Adriamycin/Cytoxan x4 cycles. -Antiestrogen therapy with tamoxifen x5 years was completed and 03/2001. -Patient was not seen at the clinic last year due to Covid. -Patient may be discharged from clinic however she would like for us to follow her labs and yearly mammograms. -Last mammogram done on 02/07/2020 showed B RADS category 1 negative. -We will see her back in 1 year with repeat labs mammogram and DEXA scan.   PLAN:  1.  Stage Ia (T1cN0M0) right breast cancer, ER positive/PR positive: - Physical examination today did not reveal any palpable masses.  Right mastectomy site is within normal limits. - Mammogram of the left breast on 02/08/2021 was BI-RADS Category 1. - She reported pain behind her left ear with a knot.  There is a palpable lymph node in the posterior part of the left parotid gland.  She had previous CT scan which did not show any malignant findings. - RTC 1 year with mammogram.  2.  Bone health: - Reviewed DEXA scan from 02/08/2021 with T score -1.7, in the osteopenia range. -Continue calcium and vitamin D supplements.  Breast Cancer therapy associated bone loss: I have recommended calcium, Vitamin D and weight bearing exercises.  Orders placed this encounter:  No orders of the defined types were  placed in this encounter.   The patient has a good understanding of the overall plan. She agrees with it. She will call with any problems that may develop before the next visit here.  Sreedhar Katragadda, MD [facility] [phone]   I, Kirstyn Evans, am acting as a scribe for Dr. Sreedhar Katragadda.  I, Sreedhar Katragadda MD, have reviewed the above documentation for accuracy and completeness, and I agree with the above.     

## 2021-02-15 ENCOUNTER — Other Ambulatory Visit: Payer: Self-pay

## 2021-02-15 ENCOUNTER — Inpatient Hospital Stay (HOSPITAL_BASED_OUTPATIENT_CLINIC_OR_DEPARTMENT_OTHER): Payer: Medicare Other | Admitting: Hematology

## 2021-02-15 ENCOUNTER — Encounter (HOSPITAL_COMMUNITY): Payer: Medicare Other | Admitting: Physical Therapy

## 2021-02-15 VITALS — BP 151/72 | HR 71 | Temp 98.6°F | Resp 18 | Wt 178.6 lb

## 2021-02-15 DIAGNOSIS — M8580 Other specified disorders of bone density and structure, unspecified site: Secondary | ICD-10-CM

## 2021-02-15 DIAGNOSIS — C50919 Malignant neoplasm of unspecified site of unspecified female breast: Secondary | ICD-10-CM

## 2021-02-15 DIAGNOSIS — Z170 Estrogen receptor positive status [ER+]: Secondary | ICD-10-CM

## 2021-02-15 NOTE — Patient Instructions (Signed)
[place] at [facility] Discharge Instructions  You were seen today by Dr. Delton Coombes. He went over your recent results. You will be scheduled for a mammogram after 12/03/2020. Dr. Delton Coombes will see you back in 1 year for labs and follow up.   Thank you for choosing [place] at [facility] to provide your oncology and hematology care.  To afford each patient quality time with our provider, please arrive at least 15 minutes before your scheduled appointment time.   If you have a lab appointment with the [facility] please come in thru the Main Entrance and check in at the main information desk  You need to re-schedule your appointment should you arrive 10 or more minutes late.  We strive to give you quality time with our providers, and arriving late affects you and other patients whose appointments are after yours.  Also, if you no show three or more times for appointments you may be dismissed from the clinic at the providers discretion.     Again, thank you for choosing [facility].  Our hope is that these requests will decrease the amount of time that you wait before being seen by our physicians.       _____________________________________________________________  Should you have questions after your visit to [facility], please contact our office at [phone] between the hours of 8:00 a.m. and 4:30 p.m.  Voicemails left after 4:00 p.m. will not be returned until the following business day.  For prescription refill requests, have your pharmacy contact our office and allow 72 hours.    Cancer Center Support Programs:   > Cancer Support Group  2nd Tuesday of the month 1pm-2pm, Journey Room

## 2021-02-19 ENCOUNTER — Encounter (HOSPITAL_COMMUNITY): Payer: Self-pay | Admitting: Hematology and Oncology

## 2021-02-20 ENCOUNTER — Encounter (HOSPITAL_COMMUNITY): Payer: Medicare Other | Admitting: Physical Therapy

## 2021-02-22 ENCOUNTER — Encounter (HOSPITAL_COMMUNITY): Payer: Medicare Other | Admitting: Physical Therapy

## 2021-02-24 ENCOUNTER — Other Ambulatory Visit: Payer: Self-pay | Admitting: Internal Medicine

## 2021-02-27 ENCOUNTER — Encounter (HOSPITAL_COMMUNITY): Payer: Medicare Other | Admitting: Physical Therapy

## 2021-03-01 ENCOUNTER — Encounter (HOSPITAL_COMMUNITY): Payer: Medicare Other | Admitting: Physical Therapy

## 2021-03-06 ENCOUNTER — Encounter (HOSPITAL_COMMUNITY): Payer: Medicare Other | Admitting: Physical Therapy

## 2021-03-08 ENCOUNTER — Encounter (HOSPITAL_COMMUNITY): Payer: Medicare Other | Admitting: Physical Therapy

## 2021-04-02 ENCOUNTER — Other Ambulatory Visit: Payer: Self-pay | Admitting: Family Medicine

## 2021-04-02 ENCOUNTER — Other Ambulatory Visit: Payer: Self-pay | Admitting: Internal Medicine

## 2021-04-05 ENCOUNTER — Other Ambulatory Visit: Payer: Self-pay | Admitting: Internal Medicine

## 2021-04-12 ENCOUNTER — Other Ambulatory Visit: Payer: Medicare Other

## 2021-04-17 ENCOUNTER — Other Ambulatory Visit: Payer: Self-pay

## 2021-04-17 ENCOUNTER — Other Ambulatory Visit: Payer: Medicare Other | Admitting: *Deleted

## 2021-04-17 DIAGNOSIS — E782 Mixed hyperlipidemia: Secondary | ICD-10-CM

## 2021-04-17 LAB — COMPREHENSIVE METABOLIC PANEL
ALT: 18 IU/L (ref 0–32)
AST: 17 IU/L (ref 0–40)
Albumin/Globulin Ratio: 1.3 (ref 1.2–2.2)
Albumin: 3.9 g/dL (ref 3.7–4.7)
Alkaline Phosphatase: 49 IU/L (ref 44–121)
BUN/Creatinine Ratio: 12 (ref 12–28)
BUN: 13 mg/dL (ref 8–27)
Bilirubin Total: 0.5 mg/dL (ref 0.0–1.2)
CO2: 24 mmol/L (ref 20–29)
Calcium: 9.1 mg/dL (ref 8.7–10.3)
Chloride: 103 mmol/L (ref 96–106)
Creatinine, Ser: 1.06 mg/dL — ABNORMAL HIGH (ref 0.57–1.00)
Globulin, Total: 3.1 g/dL (ref 1.5–4.5)
Glucose: 193 mg/dL — ABNORMAL HIGH (ref 65–99)
Potassium: 3.8 mmol/L (ref 3.5–5.2)
Sodium: 141 mmol/L (ref 134–144)
Total Protein: 7 g/dL (ref 6.0–8.5)
eGFR: 55 mL/min/{1.73_m2} — ABNORMAL LOW (ref >59)

## 2021-04-17 LAB — LIPID PANEL
Chol/HDL Ratio: 3 ratio (ref 0.0–4.4)
Cholesterol, Total: 186 mg/dL (ref 100–199)
HDL: 62 mg/dL (ref >39)
LDL Chol Calc (NIH): 103 mg/dL — ABNORMAL HIGH (ref 0–99)
Triglycerides: 116 mg/dL (ref 0–149)
VLDL Cholesterol Cal: 21 mg/dL (ref 5–40)

## 2021-04-18 ENCOUNTER — Other Ambulatory Visit: Payer: Self-pay | Admitting: Internal Medicine

## 2021-04-18 ENCOUNTER — Encounter: Payer: Self-pay | Admitting: Internal Medicine

## 2021-04-18 DIAGNOSIS — G720 Drug-induced myopathy: Secondary | ICD-10-CM | POA: Insufficient documentation

## 2021-04-18 DIAGNOSIS — T380X5A Adverse effect of glucocorticoids and synthetic analogues, initial encounter: Secondary | ICD-10-CM | POA: Insufficient documentation

## 2021-04-18 DIAGNOSIS — E1165 Type 2 diabetes mellitus with hyperglycemia: Secondary | ICD-10-CM

## 2021-04-26 ENCOUNTER — Other Ambulatory Visit: Payer: Self-pay

## 2021-04-26 ENCOUNTER — Ambulatory Visit: Payer: Medicare Other | Admitting: Internal Medicine

## 2021-04-26 ENCOUNTER — Encounter: Payer: Self-pay | Admitting: Internal Medicine

## 2021-04-26 VITALS — BP 140/78 | HR 76 | Temp 98.3°F | Resp 18 | Ht 66.5 in | Wt 182.1 lb

## 2021-04-26 DIAGNOSIS — I10 Essential (primary) hypertension: Secondary | ICD-10-CM

## 2021-04-26 DIAGNOSIS — E1165 Type 2 diabetes mellitus with hyperglycemia: Secondary | ICD-10-CM | POA: Diagnosis not present

## 2021-04-26 DIAGNOSIS — M5136 Other intervertebral disc degeneration, lumbar region: Secondary | ICD-10-CM

## 2021-04-26 DIAGNOSIS — T380X5A Adverse effect of glucocorticoids and synthetic analogues, initial encounter: Secondary | ICD-10-CM

## 2021-04-26 DIAGNOSIS — G720 Drug-induced myopathy: Secondary | ICD-10-CM

## 2021-04-26 DIAGNOSIS — K580 Irritable bowel syndrome with diarrhea: Secondary | ICD-10-CM

## 2021-04-26 DIAGNOSIS — K1230 Oral mucositis (ulcerative), unspecified: Secondary | ICD-10-CM

## 2021-04-26 DIAGNOSIS — E559 Vitamin D deficiency, unspecified: Secondary | ICD-10-CM

## 2021-04-26 LAB — POCT GLYCOSYLATED HEMOGLOBIN (HGB A1C): HbA1c, POC (controlled diabetic range): 8.2 % — AB (ref 0.0–7.0)

## 2021-04-26 MED ORDER — LIDOCAINE VISCOUS HCL 2 % MT SOLN: 5.0000 mL | Freq: Three times a day (TID) | OROMUCOSAL | 0 refills | Status: DC | PRN

## 2021-04-26 NOTE — Assessment & Plan Note (Signed)
Has had lumbar spine surgery in the past Tylenol PRN Heating pad or ice Back brace and lumbar support pillow advised

## 2021-04-26 NOTE — Assessment & Plan Note (Signed)
Did not tolerate statin in the past

## 2021-04-26 NOTE — Patient Instructions (Addendum)
Please use back brace to help with back pain. Okay to use lumbar support pillow while driving. Okay to take Tylenol for back pain up to 3 times in a day. Okay to apply heating pad or ice for swelling or pain.  Please use magic mouthwash to help with oral cavity/tongue pain.  Please continue to take other medications as prescribed.  Please follow low carb diet. Please cut down on soft drinks.  Please get fasting blood tests done before the next visit.

## 2021-04-26 NOTE — Assessment & Plan Note (Addendum)
Lab Results  Component Value Date   HGBA1C 8.2 (A) 04/26/2021   On Metformin and Glimepiride Did not tolerate Jardiance, Januvia and GLP-1 agonist in the past Needs to follow diabetic diet, admits that she does takes sugary products including soft drinks - Advised to follow diabetic diet F/u CMP and lipid panel Diabetic eye exam: Advised to follow up with Ophthalmology for diabetic eye exam

## 2021-04-26 NOTE — Progress Notes (Addendum)
Established Patient Office Visit  Subjective:  Patient ID: [name], female    DOB: 07/29/1946  Age: 75 y.o. MRN: [id number]  CC:  Chief Complaint  Patient presents with   Follow-up    4 month follow up pt is still having problems out of stomach she has IBS and she is having back pain this started back in 2011 however now it seems the back pain is constant     HPI [name] a 75 year old female with PMH of HTN, DM, breast ca s/p right mastectomy and chemotherapy, HLD, DDD of lumbar spine and GERD who presents for follow up of her chronic medical conditions.  Her BP was mildly elevated today. She takes her medications regularly. She denies any headache, chest pain, dyspnea or palpitations.   She takes Metformin and Glimepiride for DM. Her HbA1C was 8.9. She denies polyuria or polydipsia. She did not tolerate Januvia, Jardiance and Trulicity in the past. She did not tolerate statin in the past and is going to start Nexletol, sent by lipid clinic.  She c/o diarrhea, which is chronic - has h/o IBS. She takes Imodium as needed for diarrhea. She used to see Dr Oneida Alar in the past. She asks to see a different practice as Dr Oneida Alar left practice.  She has chronic low back pain and has had lumbar spine surgery in the past. She prefers to avoid Spine surgery and any medication for it now. She asks for any other support device.  She c/o oral cavity/tongue pain since chemotherapy. She has tried orajel with no relief. Denies noticing any ulcers.  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   right / chemo + tamoxifen x 5 years    Anemia    Anxiety    Aortic atherosclerosis (H[facility])    Arthritis    Blood transfusion 1980's   Breast cancer ([place])    Cancer ([place])    Phreesia 81/82/9937   Complication of anesthesia    has a hard time waking up; can't lay flat   Degenerative disc disease, lumbar    pressing on L3  and L4   Diabetes mellitus ([place]) 1989   Type 2 NIDDM; "cancer treatment gave me diabetes"   Diabetes mellitus without complication ([place])    Phreesia 08/18/2020   Diverticulosis    DJD (degenerative joint disease) of lumbar spine    Dysrhythmia    "irregular"   Exertional dyspnea    Gastroparesis    GERD (gastroesophageal reflux disease)    mann   Heart murmur    "think I outgrew it"   Hepatic steatosis    History of kidney stones    History of stomach ulcers    Hx: UTI (urinary tract infection)    Hyperlipidemia    Hypersensitivity    in tongue   Hypertension    IBS (irritable bowel syndrome)    IBS (irritable bowel syndrome)    Insomnia    Internal hemorrhoids    Kidney cysts    RT KIDNEY   Kidney stones 2009   s/p lithotripsy   Nephrolithiasis 04/13/2014   Obesity    PONV (postoperative nausea and vomiting)    Pulmonary nodule    Shortness of breath    unable to lie flat   Ventral hernia     Past Surgical History:  Procedure Laterality Date   ABDOMINAL HYSTERECTOMY  2002   APPENDECTOMY  BACK SURGERY     Can't take any medical procedure in right arm   BREAST BIOPSY     right   BREAST SURGERY N/A    Phreesia 04/22/2020   CATARACT EXTRACTION W/ INTRAOCULAR LENS  IMPLANT, BILATERAL  2009-2010   CESAREAN SECTION  1973; 1978; [place] 04/22/2020   COLONOSCOPY WITH PROPOFOL N/A 10/29/2016   Procedure: COLONOSCOPY WITH PROPOFOL;  Surgeon: Danie Binder, MD;  [facility];  Service: Endoscopy;  Laterality: N/A;  10:00 am   CYSTOSCOPY Left 04/13/2014   Procedure: CYSTOSCOPY FLEXIBLE;  Surgeon: Ardis Hughs, MD;  Location: [facility];  Service: Urology;  Laterality: Left;  with STENT   ESOPHAGOGASTRODUODENOSCOPY (EGD) WITH PROPOFOL N/A 10/29/2016   Procedure: ESOPHAGOGASTRODUODENOSCOPY (EGD) WITH PROPOFOL;  Surgeon: Danie Binder, MD;  [facility];  Service: Endoscopy;  Laterality: N/A;   EYE SURGERY      implant and screws put in the right lower jaw  11/2008   dental surgery   kidney blockage  ?1990's   " major surgery ;put kidney on pump for awhile"   LITHOTRIPSY     "several times"   LUMBAR FUSION  08/2010   MASTECTOMY  1997   right   NEPHROLITHOTOMY Left 04/13/2014   Procedure: LEFT PERCUTANEOUS NEPHROLITHOTOMY WITH SURGEON ACCESS;  Surgeon: Ardis Hughs, MD;  Location: [facility];  Service: Urology;  Laterality: Left;   OVARIAN CYST SURGERY     PARATHYROIDECTOMY  02/26/2012   Procedure: PARATHYROIDECTOMY;  Surgeon: Ascencion Dike, MD;  Location: [facility];  Service: ENT;;   SAVORY DILATION N/A 10/29/2016   Procedure: SAVORY DILATION;  Surgeon: Danie Binder, MD;  [facility];  Service: Endoscopy;  Laterality: N/A;   [place]  2011   urological surgery for blocked ureter secondary to kidney stone      Family History  Problem Relation Age of Onset   Heart attack Mother    Cancer Brother        Esophageal; smoker; deceased 69s   Colon cancer Brother    Diabetes Daughter    Leukemia Paternal Aunt        deceased 67s   Pancreatic cancer Paternal Aunt        deceased 54   Cancer Paternal Aunt        GI cancer; deceased 72s   Cancer Cousin        kidney cancer; deceased 50; pat first cousin; daughter of aunt w/ leukemia   Cancer Cousin        colon cancer @ 50; pat first cousin; son of aunt with GI cancer   Anesthesia problems Neg Hx     Social History   Socioeconomic History   Marital status: Married    Spouse name: Not on file   Number of children: 3   Years of education: Not on file   Highest education level: Not on file  Occupational History   Occupation: retired in 2011  Tobacco Use   Smoking status: Never   Smokeless tobacco: Never  Vaping Use   Vaping Use: Never used  Substance and Sexual Activity   Alcohol use: Yes    Alcohol/week: 0.0 standard drinks    Comment: X2 DRINKS PER YEAR   Drug use: No   Sexual activity: Not Currently    Birth  control/protection: Surgical  Other Topics Concern   Not on file  Social History Narrative  Not on file   Social Determinants of Health   Financial Resource Strain: Not on file  Food Insecurity: Not on file  Transportation Needs: Not on file  Physical Activity: Not on file  Stress: Not on file  Social Connections: Not on file  Intimate Partner Violence: Not on file    Outpatient Medications Prior to Visit  Medication Sig Dispense Refill   acetaminophen (TYLENOL) 325 MG tablet Take 325 mg by mouth every 6 (six) hours as needed for mild pain.     amLODipine (NORVASC) 5 MG tablet Take 1 tablet (5 mg total) by mouth daily. 90 tablet 0   Bempedoic Acid (NEXLETOL) 180 MG TABS Take 1 tablet by mouth daily. 30 tablet 11   Blood Glucose Monitoring Suppl (BLOOD GLUCOSE SYSTEM PAK) KIT Use as directed to monitor FSBS 1x daily. Dx: E11.9 1 each 1   BYSTOLIC 20 MG TABS TAKE 1 TABLET BY MOUTH TWICE DAILY. 90 tablet 1   Cholecalciferol (VITAMIN D3 PO) Take 1 tablet by mouth daily.     CINNAMON PO Take 1 capsule by mouth. Takes sometimes     famotidine (PEPCID) 20 MG tablet TAKE ONE TABLET BY MOUTH TWICE DAILY. 60 tablet 3   furosemide (LASIX) 40 MG tablet Take 1 tablet (40 mg total) by mouth every other day. (Patient taking differently: Take 40 mg by mouth as needed.) 45 tablet 3   glipiZIDE (GLUCOTROL) 10 MG tablet TAKE (1) TABLET BY MOUTH TWICE DAILY BEFORE A MEAL 60 tablet 0   glucose blood (ACCU-CHEK GUIDE) test strip 1 each by Other route as needed for other. Use as instructed to check blood sugar daily. 100 each 3   LANCETS SUPER THIN 28G MISC Use to check blood sugar once daily. 100 each 3   metFORMIN (GLUCOPHAGE) 500 MG tablet TAKE ONE TABLET BY MOUTH 2 TIMES A DAY WITH A MEAL 60 tablet 0   Multiple Vitamin (MULTIVITAMIN) tablet Take 1 tablet by mouth daily.     potassium chloride (K-DUR) 10 MEQ tablet Take 1 tablet (10 mEq total) by mouth every other day. (Patient taking differently: Take  10 mEq by mouth every other day.) 45 tablet 3   tetrahydrozoline-zinc (VISINE-AC) 0.05-0.25 % ophthalmic solution Place 2 drops into both eyes 3 (three) times daily as needed (dry eyes).     No facility-administered medications prior to visit.    Allergies  Allergen Reactions   Demerol [Meperidine] Other (See Comments)    Lost consciousness   Bentyl [Dicyclomine Hcl] Swelling   Invokana [Canagliflozin] Hives   Metformin And Related Diarrhea   Protonix [Pantoprazole Sodium] Swelling   Statins Other (See Comments)    myalgia   Ace Inhibitors Cough    ROS Review of Systems  Constitutional:  Negative for chills and fever.  HENT:  Positive for mouth sores. Negative for congestion, sinus pressure, sinus pain and sore throat.   Eyes:  Negative for pain and discharge.  Respiratory:  Negative for cough and shortness of breath.   Cardiovascular:  Negative for chest pain and palpitations.  Gastrointestinal:  Negative for abdominal pain, constipation, diarrhea, nausea and vomiting.  Endocrine: Negative for polydipsia and polyuria.  Genitourinary:  Negative for dysuria and hematuria.  Musculoskeletal:  Positive for arthralgias and back pain. Negative for neck pain and neck stiffness.  Skin:  Negative for rash.  Neurological:  Negative for dizziness and weakness.  Psychiatric/Behavioral:  Negative for agitation and behavioral problems.      Objective:  Physical Exam Vitals reviewed.  Constitutional:      General: She is not in acute distress.    Appearance: She is not diaphoretic.  HENT:     Head: Normocephalic and atraumatic.     Nose: Nose normal.     Mouth/Throat:     Mouth: Mucous membranes are moist.  Eyes:     General: No scleral icterus.    Extraocular Movements: Extraocular movements intact.  Cardiovascular:     Rate and Rhythm: Normal rate and regular rhythm.     Pulses: Normal pulses.     Heart sounds: Murmur (Systolic murmur over upper sternal borders) heard.   Pulmonary:     Breath sounds: Normal breath sounds. No wheezing or rales.  Abdominal:     Palpations: Abdomen is soft.     Tenderness: no abdominal tenderness  Musculoskeletal:     Cervical back: Neck supple. No tenderness.     Right lower leg: No edema.     Left lower leg: No edema.  Skin:    General: Skin is warm.     Findings: No rash.  Neurological:     General: No focal deficit present.     Mental Status: She is alert and oriented to person, place, and time.  Psychiatric:        Mood and Affect: Mood normal.        Behavior: Behavior normal.    BP 140/78 (BP Location: Left Arm, Cuff Size: Normal)   Pulse 76   Temp 98.3 F (36.8 C) (Oral)   Resp 18   Ht 5' 6.5" (1.689 m)   Wt 182 lb 1.9 oz (82.6 kg)   SpO2 98%   BMI 28.95 kg/m  Wt Readings from Last 3 Encounters:  04/26/21 182 lb 1.9 oz (82.6 kg)  02/15/21 178 lb 9.6 oz (81 kg)  12/26/20 184 lb 1.9 oz (83.5 kg)     Health Maintenance Due  Topic Date Due   Zoster Vaccines- Shingrix (1 of 2) Never done   COVID-19 Vaccine (4 - Booster for Pfizer series) 10/22/2020   URINE MICROALBUMIN  02/22/2021   INFLUENZA VACCINE  04/23/2021    There are no preventive care reminders to display for this patient.  Lab Results  Component Value Date   TSH 0.57 01/05/2020   Lab Results  Component Value Date   WBC 10.3 10/25/2020   HGB 11.4 (L) 10/25/2020   HCT 34.9 (L) 10/25/2020   MCV 79.0 (L) 10/25/2020   PLT 204 10/25/2020   Lab Results  Component Value Date   NA 141 04/17/2021   K 3.8 04/17/2021   CO2 24 04/17/2021   GLUCOSE 193 (H) 04/17/2021   BUN 13 04/17/2021   CREATININE 1.06 (H) 04/17/2021   BILITOT 0.5 04/17/2021   ALKPHOS 49 04/17/2021   AST 17 04/17/2021   ALT 18 04/17/2021   PROT 7.0 04/17/2021   ALBUMIN 3.9 04/17/2021   CALCIUM 9.1 04/17/2021   ANIONGAP 9 11/20/2018   EGFR 55 (L) 04/17/2021   GFR 55.96 (L) 02/23/2020   Lab Results  Component Value Date   CHOL 186 04/17/2021   Lab  Results  Component Value Date   HDL 62 04/17/2021   Lab Results  Component Value Date   LDLCALC 103 (H) 04/17/2021   Lab Results  Component Value Date   TRIG 116 04/17/2021   Lab Results  Component Value Date   CHOLHDL 3.0 04/17/2021   Lab Results  Component Value Date  HGBA1C 8.2 (A) 04/26/2021      Assessment & Plan:   Problem List Items Addressed This Visit       Cardiovascular and Mediastinum   Essential hypertension    BP Readings from Last 1 Encounters:  04/26/21 140/78  Elevated today, needs to follow low salt diet Counseled for compliance with the medications Advised DASH diet and moderate exercise/walking, at least 150 mins/week       Relevant Orders   CBC with Differential/Platelet   TSH + free T4     Digestive   IBS (irritable bowel syndrome)    Imodium PRN for diarrhea Advised to try lactose and gluten free diet F/u with GI       Relevant Medications   magic mouthwash (lidocaine, diphenhydrAMINE, alum & mag hydroxide) suspension   Other Relevant Orders   Ambulatory referral to Gastroenterology     Endocrine   Uncontrolled type 2 diabetes mellitus with hyperglycemia, without long-term current use of insulin ([place]) - Primary    Lab Results  Component Value Date   HGBA1C 8.2 (A) 04/26/2021  On Metformin and Glimepiride Did not tolerate Jardiance, Januvia and GLP-1 agonist in the past Needs to follow diabetic diet, admits that she does takes sugary products including soft drinks - Advised to follow diabetic diet F/u CMP and lipid panel Diabetic eye exam: Advised to follow up with Ophthalmology for diabetic eye exam      Relevant Orders   Microalbumin, urine   POCT glycosylated hemoglobin (Hb A1C) (Completed)   CMP14+EGFR   Lipid Profile   HgB A1c     Musculoskeletal and Integument   DDD (degenerative disc disease), lumbar    Has had lumbar spine surgery in the past Tylenol PRN Heating pad or ice Back brace and lumbar support pillow  advised        Steroid-induced myopathy    Did not tolerate statin in the past       Other Visit Diagnoses     Oral mucositis       Relevant Medications   magic mouthwash (lidocaine, diphenhydrAMINE, alum & mag hydroxide) suspension   Vitamin D deficiency       Relevant Orders   Vitamin D (25 hydroxy)       Meds ordered this encounter  Medications   magic mouthwash (lidocaine, diphenhydrAMINE, alum & mag hydroxide) suspension    Sig: Swish and spit 5 mLs 3 (three) times daily as needed for mouth pain.    Dispense:  360 mL    Refill:  0    Follow-up: Return in about 4 months (around 08/26/2021) for Annual physical.    Lindell Spar, MD

## 2021-04-26 NOTE — Assessment & Plan Note (Signed)
BP Readings from Last 1 Encounters:  04/26/21 140/78   Elevated today, needs to follow low salt diet Counseled for compliance with the medications Advised DASH diet and moderate exercise/walking, at least 150 mins/week

## 2021-04-26 NOTE — Assessment & Plan Note (Signed)
Imodium PRN for diarrhea Advised to try lactose and gluten free diet F/u with GI

## 2021-04-27 ENCOUNTER — Other Ambulatory Visit: Payer: Self-pay | Admitting: *Deleted

## 2021-04-27 MED ORDER — NEXLETOL 180 MG PO TABS: 1.0000 | ORAL_TABLET | ORAL | 11 refills | Status: DC

## 2021-04-28 LAB — MICROALBUMIN, URINE: Microalbumin, Urine: 102.9 ug/mL

## 2021-05-07 ENCOUNTER — Other Ambulatory Visit: Payer: Self-pay | Admitting: Internal Medicine

## 2021-06-11 ENCOUNTER — Other Ambulatory Visit: Payer: Self-pay | Admitting: Internal Medicine

## 2021-06-25 ENCOUNTER — Other Ambulatory Visit: Payer: Self-pay | Admitting: Internal Medicine

## 2021-06-25 DIAGNOSIS — E1165 Type 2 diabetes mellitus with hyperglycemia: Secondary | ICD-10-CM

## 2021-07-10 ENCOUNTER — Other Ambulatory Visit: Payer: Self-pay | Admitting: Internal Medicine

## 2021-07-30 ENCOUNTER — Other Ambulatory Visit: Payer: Self-pay | Admitting: Internal Medicine

## 2021-07-30 DIAGNOSIS — E1165 Type 2 diabetes mellitus with hyperglycemia: Secondary | ICD-10-CM

## 2021-08-15 ENCOUNTER — Other Ambulatory Visit: Payer: Self-pay | Admitting: Internal Medicine

## 2021-08-23 ENCOUNTER — Ambulatory Visit (INDEPENDENT_AMBULATORY_CARE_PROVIDER_SITE_OTHER): Payer: Medicare Other

## 2021-08-23 ENCOUNTER — Other Ambulatory Visit: Payer: Self-pay

## 2021-08-23 DIAGNOSIS — Z0000 Encounter for general adult medical examination without abnormal findings: Secondary | ICD-10-CM | POA: Diagnosis not present

## 2021-08-23 MED ORDER — NEBIVOLOL HCL 20 MG PO TABS: 1.0000 | ORAL_TABLET | Freq: Two times a day (BID) | ORAL | 2 refills | Status: DC

## 2021-08-23 NOTE — Patient Instructions (Signed)

## 2021-08-23 NOTE — Progress Notes (Signed)
I connected with  [name] on 08/23/21 by a audio enabled telemedicine application and verified that I am speaking with the correct person using two identifiers.  Patient Location: Home  Provider Location: Office/Clinic  I discussed the limitations of evaluation and management by telemedicine. The patient expressed understanding and agreed to proceed.    Subjective:   [name] is a 75 y.o. female who presents for Medicare Annual (Subsequent) preventive examination.  Review of Systems     Cardiac Risk Factors include: advanced age (>30mn, >>37women);sedentary lifestyle     Objective:    There were no vitals filed for this visit. There is no height or weight on file to calculate BMI.  Advanced Directives 08/23/2021 02/15/2021 01/17/2021 12/06/2020 08/21/2020 04/13/2014 04/06/2014  Does Patient Have a Medical Advance Directive? No Yes Yes Yes No Patient has advance directive, copy not in chart Patient has advance directive, copy not in chart  Type of Advance Directive - Living will;Healthcare Power of Attorney Living will;[place]care [place];Healthcare Power of Attorney Living will;Healthcare Power of Attorney  Does patient want to make changes to medical advance directive? - No - Patient declined No - Patient declined - - - -  Copy of HPress photographerin Chart? - - - - - Copy requested from family Copy requested from family  Would patient like information on creating a medical advance directive? Yes (ED - Information included in AVS) - - - No - Patient declined - -  Pre-existing out of facility DNR order (yellow form or pink MOST form) - - - - - - -  Some encounter information is confidential and restricted. Go to Review Flowsheets activity to see all data.    Current Medications (verified) Outpatient Encounter Medications as of 08/23/2021  Medication Sig   CINNAMON PO Take 1 capsule by mouth.  Takes sometimes   acetaminophen (TYLENOL) 325 MG tablet Take 325 mg by mouth every 6 (six) hours as needed for mild pain.   amLODipine (NORVASC) 5 MG tablet Take 1 tablet (5 mg total) by mouth daily.   Bempedoic Acid (NEXLETOL) 180 MG TABS Take 1 tablet by mouth every other day.   Blood Glucose Monitoring Suppl (BLOOD GLUCOSE SYSTEM PAK) KIT Use as directed to monitor FSBS 1x daily. Dx: E11.9   Cholecalciferol (VITAMIN D3 PO) Take 1 tablet by mouth daily.   famotidine (PEPCID) 20 MG tablet TAKE ONE TABLET BY MOUTH TWICE DAILY.   furosemide (LASIX) 40 MG tablet Take 1 tablet (40 mg total) by mouth every other day. (Patient taking differently: Take 40 mg by mouth as needed.)   glipiZIDE (GLUCOTROL) 10 MG tablet TAKE (1) TABLET BY MOUTH TWICE DAILY BEFORE A MEAL   glucose blood (ACCU-CHEK GUIDE) test strip 1 each by Other route as needed for other. Use as instructed to check blood sugar daily.   LANCETS SUPER THIN 28G MISC Use to check blood sugar once daily.   magic mouthwash (lidocaine, diphenhydrAMINE, alum & mag hydroxide) suspension Swish and spit 5 mLs 3 (three) times daily as needed for mouth pain.   metFORMIN (GLUCOPHAGE) 500 MG tablet TAKE ONE TABLET BY MOUTH 2 TIMES A DAY WITH A MEAL   Multiple Vitamin (MULTIVITAMIN) tablet Take 1 tablet by mouth daily.   Nebivolol HCl (BYSTOLIC) 20 MG TABS Take 1 tablet (20 mg total) by mouth 2 (two) times daily.   potassium chloride (K-DUR) 10 MEQ tablet Take  1 tablet (10 mEq total) by mouth every other day. (Patient taking differently: Take 10 mEq by mouth every other day.)   tetrahydrozoline-zinc (VISINE-AC) 0.05-0.25 % ophthalmic solution Place 2 drops into both eyes 3 (three) times daily as needed (dry eyes).   [DISCONTINUED] BYSTOLIC 20 MG TABS TAKE 1 TABLET BY MOUTH TWICE DAILY.   No facility-administered encounter medications on file as of 08/23/2021.    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   right / chemo + tamoxifen x 5 years    Anemia    Anxiety    Aortic atherosclerosis ([facility])    Arthritis    Blood transfusion 1980's   Breast cancer ([place])    Cancer ([place])    Phreesia 50/27/7412   Complication of anesthesia    has a hard time waking up; can't lay flat   Degenerative disc disease, lumbar    pressing on L3 and L4   Diabetes mellitus ([place]) 1989   Type 2 NIDDM; "cancer treatment gave me diabetes"   Diabetes mellitus without complication ([place])    Phreesia 08/18/2020   Diverticulosis    DJD (degenerative joint disease) of lumbar spine    Dysrhythmia    "irregular"   Exertional dyspnea    Gastroparesis    GERD (gastroesophageal reflux disease)    mann   Heart murmur    "think I outgrew it"   Hepatic steatosis    History of kidney stones    History of stomach ulcers    Hx: UTI (urinary tract infection)    Hyperlipidemia    Hypersensitivity    in tongue   Hypertension    IBS (irritable bowel syndrome)    IBS (irritable bowel syndrome)    Insomnia    Internal hemorrhoids    Kidney cysts    RT KIDNEY   Kidney stones 2009   s/p lithotripsy   Nephrolithiasis 04/13/2014   Obesity    PONV (postoperative nausea and vomiting)    Pulmonary nodule    Shortness of breath    unable to lie flat   Ventral hernia    Past Surgical History:  Procedure Laterality Date   ABDOMINAL HYSTERECTOMY  2002   APPENDECTOMY     BACK SURGERY     Can't take any medical procedure in right arm   BREAST BIOPSY     right   BREAST SURGERY N/A    Phreesia 04/22/2020   CATARACT EXTRACTION W/ INTRAOCULAR LENS  IMPLANT, BILATERAL  2009-2010   CESAREAN SECTION  1973; 1978; [place] 04/22/2020   COLONOSCOPY WITH PROPOFOL N/A  10/29/2016   Procedure: COLONOSCOPY WITH PROPOFOL;  Surgeon: Danie Binder, MD;  [facility];  Service: Endoscopy;  Laterality: N/A;  10:00 am   CYSTOSCOPY Left 04/13/2014   Procedure: CYSTOSCOPY FLEXIBLE;  Surgeon: Ardis Hughs, MD;  Location: [facility];  Service: Urology;  Laterality: Left;  with STENT   ESOPHAGOGASTRODUODENOSCOPY (EGD) WITH PROPOFOL N/A 10/29/2016   Procedure: ESOPHAGOGASTRODUODENOSCOPY (EGD) WITH PROPOFOL;  Surgeon: Danie Binder, MD;  [facility];  Service: Endoscopy;  Laterality: N/A;   EYE SURGERY     implant and screws put in the right lower jaw  11/2008   dental surgery  kidney blockage  ?1990's   " major surgery ;put kidney on pump for awhile"   LITHOTRIPSY     "several times"   LUMBAR FUSION  08/2010   MASTECTOMY  1997   right   NEPHROLITHOTOMY Left 04/13/2014   Procedure: LEFT PERCUTANEOUS NEPHROLITHOTOMY WITH SURGEON ACCESS;  Surgeon: Ardis Hughs, MD;  Location: [facility];  Service: Urology;  Laterality: Left;   OVARIAN CYST SURGERY     PARATHYROIDECTOMY  02/26/2012   Procedure: PARATHYROIDECTOMY;  Surgeon: Ascencion Dike, MD;  Location: [place];  Service: ENT;;   SAVORY DILATION N/A 10/29/2016   Procedure: SAVORY DILATION;  Surgeon: Danie Binder, MD;  [facility];  Service: Endoscopy;  Laterality: N/A;   [place]  2011   urological surgery for blocked ureter secondary to kidney stone     Family History  Problem Relation Age of Onset   Birth defects Mother    Heart attack Mother    Cancer Brother        Esophageal; smoker; deceased 64s   Colon cancer Brother    Diabetes Daughter    Leukemia Paternal Aunt        deceased 97s   Pancreatic cancer Paternal Aunt        deceased 22   Cancer Paternal Aunt        GI cancer; deceased 54s   Cancer Cousin        kidney cancer; deceased 37; pat first cousin; daughter of aunt w/ leukemia   Cancer Cousin        colon cancer @ 10; pat first cousin; son of aunt with GI  cancer   Anesthesia problems Neg Hx    Social History   Socioeconomic History   Marital status: Married    Spouse name: Not on file   Number of children: 3   Years of education: Not on file   Highest education level: Not on file  Occupational History   Occupation: retired in 2011  Tobacco Use   Smoking status: Never   Smokeless tobacco: Never  Vaping Use   Vaping Use: Never used  Substance and Sexual Activity   Alcohol use: Yes    Alcohol/week: 0.0 standard drinks    Comment: X2 DRINKS PER YEAR   Drug use: No   Sexual activity: Not Currently    Birth control/protection: Surgical  Other Topics Concern   Not on file  Social History Narrative   Not on file   Social Determinants of Health   Financial Resource Strain: Not on file  Food Insecurity: Not on file  Transportation Needs: No Transportation Needs   Lack of Transportation (Medical): No   Lack of Transportation (Non-Medical): No  Physical Activity: Not on file  Stress: Not on file  Social Connections: Not on file    Tobacco Counseling Counseling given: Not Answered   Clinical Intake:     Pain : No/denies pain     Nutritional Status: BMI 25 -29 Overweight  How often do you need to have someone help you when you read instructions, pamphlets, or other written materials from your doctor or pharmacy?: 1 - Never  Diabetic? Nutrition Risk Assessment:  Has the patient had any N/V/D within the last 2 months?  No  Does the patient have any non-healing wounds?  No  Has the patient had any unintentional weight loss or weight gain?  No   Diabetes:  Is the patient diabetic?  Yes  If diabetic, was a  CBG obtained today?  No  Did the patient bring in their glucometer from home?  No  How often do you monitor your CBG's? daily.   Financial Strains and Diabetes Management:  Are you having any financial strains with the device, your supplies or your medication? No .  Does the patient want to be seen by Chronic  Care Management for management of their diabetes?  No  Would the patient like to be referred to a Nutritionist or for Diabetic Management?  No   Diabetic Exams:  Diabetic Eye Exam: Completed yes Diabetic Foot Exam: Completed yes    Interpreter Needed?: No      Activities of Daily Living In your present state of health, do you have any difficulty performing the following activities: 08/23/2021  Hearing? N  Vision? N  Difficulty concentrating or making decisions? N  Walking or climbing stairs? Y  Dressing or bathing? N  Doing errands, shopping? N  In the past six months, have you accidently leaked urine? N  Do you have problems with loss of bowel control? N  Managing your Medications? N  Managing your Finances? N  Housekeeping or managing your Housekeeping? N  Some recent data might be hidden    Patient Care Team: Lindell Spar, MD as PCP - General (Internal Medicine) Fay Records, MD as PCP - Cardiology (Cardiology) Ned Grace, DDS (Inactive) as Consulting Physician (Dentistry) Gean Birchwood, DPM as Consulting Physician (Podiatry) Lora Havens, MD (Ophthalmology) Leta Baptist, MD as Attending Physician (Otolaryngology) Edythe Clarity, [facility] as Pharmacist (Pharmacist)  Indicate any recent Medical Services you may have received from other than [facility] providers in the past year (date may be approximate).     Assessment:   This is a routine wellness examination for [facility].  Hearing/Vision screen No results found.  Dietary issues and exercise activities discussed: Current Exercise Habits: The patient does not participate in regular exercise at present, Exercise limited by: orthopedic condition(s)   Goals Addressed             This Visit's Progress    HEMOGLOBIN A1C < 7         Depression Screen PHQ 2/9 Scores 04/26/2021 12/26/2020 08/21/2020 04/17/2020 12/15/2019 06/03/2013  PHQ - 2 Score 1 0 0 0 2 0  PHQ- 9 Score - - - - 11 -  Some encounter information  is confidential and restricted. Go to Review Flowsheets activity to see all data.    Fall Risk Fall Risk  08/23/2021 04/26/2021 12/26/2020 08/21/2020 04/17/2020  Falls in the past year? 0 0 0 0 0  Comment - - - - -  Number falls in past yr: 0 0 0 - -  Injury with Fall? 0 0 0 - -  Comment - - - - -  Risk for fall due to : - No Fall Risks No Fall Risks No Fall Risks No Fall Risks  Follow up - Falls evaluation completed Falls evaluation completed Falls evaluation completed Falls evaluation completed  Some encounter information is confidential and restricted. Go to Review Flowsheets activity to see all data.    FALL RISK PREVENTION PERTAINING TO THE HOME:  Any stairs in or around the home? No  If so, are there any without handrails? No  Home free of loose throw rugs in walkways, pet beds, electrical cords, etc? Yes  Adequate lighting in your home to reduce risk of falls? Yes   ASSISTIVE DEVICES UTILIZED TO PREVENT FALLS:  Life alert? No  Use of a cane, walker or w/c? No  Grab bars in the bathroom? No  Shower chair or bench in shower? No  Elevated toilet seat or a handicapped toilet? No   Cognitive Function:     6CIT Screen 08/23/2021  What Year? 0 points  What month? 0 points  What time? 0 points  Count back from 20 0 points  Months in reverse 0 points  Repeat phrase 0 points  Total Score 0    Immunizations Immunization History  Administered Date(s) Administered   Influenza Whole 07/02/2006   Influenza, High Dose Seasonal PF 06/10/2018, 06/30/2019   Influenza,inj,Quad PF,6+ Mos 06/03/2013, 06/10/2014, 07/13/2015, 07/03/2016   Influenza-Unspecified 06/24/2019   PFIZER(Purple Top)SARS-COV-2 Vaccination 10/25/2019, 11/21/2019, 07/22/2020   Pneumococcal Conjugate-13 06/27/2014   Pneumococcal Polysaccharide-23 01/16/2018   Pneumococcal-Unspecified 12/23/2011   Tdap 12/11/2011    TDAP status: Up to date  Flu Vaccine status: Due, Education has been provided regarding the  importance of this vaccine. Advised may receive this vaccine at local pharmacy or Health Dept. Aware to provide a copy of the vaccination record if obtained from local pharmacy or Health Dept. Verbalized acceptance and understanding.  Pneumococcal vaccine status: Up to date  Covid-19 vaccine status: Completed vaccines  Qualifies for Shingles Vaccine? Yes   Zostavax completed No   Shingrix Completed?: No.    Education has been provided regarding the importance of this vaccine. Patient has been advised to call insurance company to determine out of pocket expense if they have not yet received this vaccine. Advised may also receive vaccine at local pharmacy or Health Dept. Verbalized acceptance and understanding.  Screening Tests Health Maintenance  Topic Date Due   Zoster Vaccines- Shingrix (1 of 2) Never done   COVID-19 Vaccine (4 - Booster for Pfizer series) 09/16/2020   INFLUENZA VACCINE  04/23/2021   OPHTHALMOLOGY EXAM  09/09/2021   HEMOGLOBIN A1C  10/27/2021   TETANUS/TDAP  12/10/2021   FOOT EXAM  04/26/2022   URINE MICROALBUMIN  04/26/2022   MAMMOGRAM  02/09/2023   COLONOSCOPY (Pts 45-8yr Insurance coverage will need to be confirmed)  10/29/2026   Pneumonia Vaccine 75 Years old  Completed   DEXA SCAN  Completed   Hepatitis C Screening  Completed   HPV VACCINES  Aged Out    Health Maintenance  Health Maintenance Due  Topic Date Due   Zoster Vaccines- Shingrix (1 of 2) Never done   COVID-19 Vaccine (4 - Booster for Pfizer series) 09/16/2020   INFLUENZA VACCINE  04/23/2021    Colorectal cancer screening: Type of screening: Colonoscopy. Completed 2018. Repeat every 10 years  Mammogram status: Completed yes. Repeat every year  Bone Density status: Completed yes. Results reflect: Bone density results: NORMAL. Repeat every 2 years.  Lung Cancer Screening: (Low Dose CT Chest recommended if Age 74-80years, 30 pack-year currently smoking OR have quit w/in 15years.) does not  qualify.   Lung Cancer Screening Referral: na  Additional Screening:  Hepatitis C Screening: does not qualify; Completed yes  Vision Screening: Recommended annual ophthalmology exams for early detection of glaucoma and other disorders of the eye. Is the patient up to date with their annual eye exam?  No  Who is the provider or what is the name of the office in which the patient attends annual eye exams?  If pt is not established with a provider, would they like to be referred to a provider to establish care? No .   Dental Screening: Recommended annual dental exams for  proper oral hygiene  Community Resource Referral / Chronic Care Management: CRR required this visit?  No   CCM required this visit?  No      Plan:     I have personally reviewed and noted the following in the patient's chart:   Medical and social history Use of alcohol, tobacco or illicit drugs  Current medications and supplements including opioid prescriptions.  Functional ability and status Nutritional status Physical activity Advanced directives List of other physicians Hospitalizations, surgeries, and ER visits in previous 12 months Vitals Screenings to include cognitive, depression, and falls Referrals and appointments  In addition, I have reviewed and discussed with patient certain preventive protocols, quality metrics, and best practice recommendations. A written personalized care plan for preventive services as well as general preventive health recommendations were provided to patient.     Kate Sable, LPN, LPN   [id number]   Nurse Notes: .

## 2021-08-30 ENCOUNTER — Other Ambulatory Visit: Payer: Self-pay

## 2021-08-30 ENCOUNTER — Other Ambulatory Visit: Payer: Self-pay | Admitting: Internal Medicine

## 2021-08-30 ENCOUNTER — Encounter: Payer: Self-pay | Admitting: Internal Medicine

## 2021-08-30 ENCOUNTER — Ambulatory Visit (INDEPENDENT_AMBULATORY_CARE_PROVIDER_SITE_OTHER): Payer: Medicare Other | Admitting: Internal Medicine

## 2021-08-30 VITALS — BP 142/68 | HR 70 | Resp 18 | Ht 66.5 in | Wt 175.0 lb

## 2021-08-30 DIAGNOSIS — E1165 Type 2 diabetes mellitus with hyperglycemia: Secondary | ICD-10-CM

## 2021-08-30 DIAGNOSIS — R5382 Chronic fatigue, unspecified: Secondary | ICD-10-CM | POA: Diagnosis not present

## 2021-08-30 DIAGNOSIS — E782 Mixed hyperlipidemia: Secondary | ICD-10-CM

## 2021-08-30 DIAGNOSIS — I10 Essential (primary) hypertension: Secondary | ICD-10-CM | POA: Diagnosis not present

## 2021-08-30 DIAGNOSIS — Z0001 Encounter for general adult medical examination with abnormal findings: Secondary | ICD-10-CM | POA: Diagnosis not present

## 2021-08-30 DIAGNOSIS — E538 Deficiency of other specified B group vitamins: Secondary | ICD-10-CM | POA: Insufficient documentation

## 2021-08-30 NOTE — Assessment & Plan Note (Signed)
BP Readings from Last 1 Encounters:  08/30/21 (!) 142/68   Elevated today, needs to follow low salt diet On Amlodipine and Bystolic, may switch Bystolic to ARB as B-blocker can also be causing fatigue Counseled for compliance with the medications Advised DASH diet and moderate exercise/walking, at least 150 mins/week

## 2021-08-30 NOTE — Assessment & Plan Note (Signed)
Lab Results  Component Value Date   HGBA1C 8.2 (A) 04/26/2021   On Metformin and Glimepiride Did not tolerate Jardiance, Januvia and GLP-1 agonist in the past Needs to follow diabetic diet, admits that she does takes sugary products including soft drinks - Advised to follow diabetic diet F/u CMP and lipid panel Diabetic eye exam: Advised to follow up with Ophthalmology for diabetic eye exam

## 2021-08-30 NOTE — Assessment & Plan Note (Signed)
Unclear etiology Check TSH, CMP and Vitamin B12 levels Vestibular exercises for dizziness

## 2021-08-30 NOTE — Assessment & Plan Note (Signed)
On Metformin for a long time Check Vitamin B12

## 2021-08-30 NOTE — Assessment & Plan Note (Signed)

## 2021-08-30 NOTE — Progress Notes (Signed)
Established Patient Office Visit  Subjective:  Patient ID: [name], female    DOB: April 02, 1946  Age: 75 y.o. MRN: [id number]  CC:  Chief Complaint  Patient presents with   Annual Exam    Annual exam for last 2 months pt just hasnt felt good has been feeling weak gets winded and has dizzy spells     HPI [name] is a 75 y.o. female with past medical history of of HTN, DM, breast ca s/p right mastectomy and chemotherapy, HLD, DDD of lumbar spine and GERD who presents for annual physical.  Her BP was mildly elevated today. She takes her medications regularly. She denies any headache, chest pain, dyspnea or palpitations.   She takes Metformin and Glimepiride for DM. Her HbA1C was 8.9. She denies polyuria or polydipsia. She did not tolerate Januvia, Jardiance and Trulicity in the past. She did not tolerate statin in the past and is taking Nexletol, sent by lipid clinic.  She has chronic loose BM - has h/o IBS, takes Imodium as needed for diarrhea.  Of note, she takes metformin for it DM, which could also cause loose BM.  She complains of chronic fatigue, dizziness and dyspnea on exertion, which is worsening now.  She was referred to PT for vestibular exercises in the past, but she did not continue it.  She has not seen cardiology as well recently.  She has a history of systolic murmur and needs a cardiology follow-up for possible echo.  She denies flu vaccine in the office today.  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   right / chemo + tamoxifen x 5 years    Anemia    Anxiety    Aortic atherosclerosis (HCC)    Arthritis    Blood transfusion 1980's   Breast cancer ([place])    Cancer ([place])    Phreesia 91/63/8466   Complication of anesthesia    has a hard time waking up; can't lay flat   Degenerative disc disease, lumbar    pressing on L3 and L4   Diabetes mellitus ([place]) 1989   Type 2 NIDDM;  "cancer treatment gave me diabetes"   Diabetes mellitus without complication ([place])    Phreesia 08/18/2020   Diverticulosis    DJD (degenerative joint disease) of lumbar spine    Dysrhythmia    "irregular"   Exertional dyspnea    Gastroparesis    GERD (gastroesophageal reflux disease)    mann   Heart murmur    "think I outgrew it"   Hepatic steatosis    History of kidney stones    History of stomach ulcers    Hx: UTI (urinary tract infection)    Hyperlipidemia    Hypersensitivity    in tongue   Hypertension    IBS (irritable bowel syndrome)    IBS (irritable bowel syndrome)    Insomnia    Internal hemorrhoids    Kidney cysts    RT KIDNEY   Kidney stones 2009   s/p lithotripsy   Nephrolithiasis 04/13/2014   Obesity    PONV (postoperative nausea and vomiting)    Pulmonary nodule    Shortness of breath    unable to lie flat   Ventral hernia     Past Surgical History:  Procedure Laterality Date   ABDOMINAL HYSTERECTOMY  2002   APPENDECTOMY     BACK SURGERY     Can't  take any medical procedure in right arm   BREAST BIOPSY     right   BREAST SURGERY N/A    Phreesia 04/22/2020   CATARACT EXTRACTION W/ INTRAOCULAR LENS  IMPLANT, BILATERAL  2009-2010   CESAREAN SECTION  1973; 1978; [place] 04/22/2020   COLONOSCOPY WITH PROPOFOL N/A 10/29/2016   Procedure: COLONOSCOPY WITH PROPOFOL;  Surgeon: Danie Binder, MD;  [facility];  Service: Endoscopy;  Laterality: N/A;  10:00 am   CYSTOSCOPY Left 04/13/2014   Procedure: CYSTOSCOPY FLEXIBLE;  Surgeon: Ardis Hughs, MD;  [facility];  Service: Urology;  Laterality: Left;  with STENT   ESOPHAGOGASTRODUODENOSCOPY (EGD) WITH PROPOFOL N/A 10/29/2016   Procedure: ESOPHAGOGASTRODUODENOSCOPY (EGD) WITH PROPOFOL;  Surgeon: Danie Binder, MD;  [facility];  Service: Endoscopy;  Laterality: N/A;   EYE SURGERY     implant and screws put in the right lower jaw  11/2008    dental surgery   kidney blockage  ?1990's   " major surgery ;put kidney on pump for awhile"   LITHOTRIPSY     "several times"   LUMBAR FUSION  08/2010   MASTECTOMY  1997   right   NEPHROLITHOTOMY Left 04/13/2014   Procedure: LEFT PERCUTANEOUS NEPHROLITHOTOMY WITH SURGEON ACCESS;  Surgeon: Ardis Hughs, MD;  [facility];  Service: Urology;  Laterality: Left;   OVARIAN CYST SURGERY     PARATHYROIDECTOMY  02/26/2012   Procedure: PARATHYROIDECTOMY;  Surgeon: Ascencion Dike, MD;  Location: [facility];  Service: ENT;;   SAVORY DILATION N/A 10/29/2016   Procedure: SAVORY DILATION;  Surgeon: Danie Binder, MD;  [facility];  Service: Endoscopy;  Laterality: N/A;   [place]  2011   urological surgery for blocked ureter secondary to kidney stone      Family History  Problem Relation Age of Onset   Birth defects Mother    Heart attack Mother    Cancer Brother        Esophageal; smoker; deceased 2s   Colon cancer Brother    Diabetes Daughter    Leukemia Paternal Aunt        deceased 19s   Pancreatic cancer Paternal Aunt        deceased 24   Cancer Paternal Aunt        GI cancer; deceased 71s   Cancer Cousin        kidney cancer; deceased 26; pat first cousin; daughter of aunt w/ leukemia   Cancer Cousin        colon cancer @ 23; pat first cousin; son of aunt with GI cancer   Anesthesia problems Neg Hx     Social History   Socioeconomic History   Marital status: Married    Spouse name: Not on file   Number of children: 3   Years of education: Not on file   Highest education level: Not on file  Occupational History   Occupation: retired in 2011  Tobacco Use   Smoking status: Never   Smokeless tobacco: Never  Vaping Use   Vaping Use: Never used  Substance and Sexual Activity   Alcohol use: Yes    Alcohol/week: 0.0 standard drinks    Comment: X2 DRINKS PER YEAR   Drug use: No   Sexual activity: Not Currently    Birth control/protection: Surgical  Other  Topics Concern   Not on file  Social History Narrative  Not on file   Social Determinants of Health   Financial Resource Strain: Not on file  Food Insecurity: Not on file  Transportation Needs: No Transportation Needs   Lack of Transportation (Medical): No   Lack of Transportation (Non-Medical): No  Physical Activity: Not on file  Stress: Not on file  Social Connections: Not on file  Intimate Partner Violence: Not on file    Outpatient Medications Prior to Visit  Medication Sig Dispense Refill   acetaminophen (TYLENOL) 325 MG tablet Take 325 mg by mouth every 6 (six) hours as needed for mild pain.     amLODipine (NORVASC) 5 MG tablet Take 1 tablet (5 mg total) by mouth daily. 90 tablet 0   Bempedoic Acid (NEXLETOL) 180 MG TABS Take 1 tablet by mouth every other day. 30 tablet 11   Blood Glucose Monitoring Suppl (BLOOD GLUCOSE SYSTEM PAK) KIT Use as directed to monitor FSBS 1x daily. Dx: E11.9 1 each 1   Cholecalciferol (VITAMIN D3 PO) Take 1 tablet by mouth daily.     CINNAMON PO Take 1 capsule by mouth. Takes sometimes     famotidine (PEPCID) 20 MG tablet TAKE ONE TABLET BY MOUTH TWICE DAILY. 60 tablet 1   furosemide (LASIX) 40 MG tablet Take 1 tablet (40 mg total) by mouth every other day. (Patient taking differently: Take 40 mg by mouth as needed.) 45 tablet 3   glipiZIDE (GLUCOTROL) 10 MG tablet TAKE (1) TABLET BY MOUTH TWICE DAILY BEFORE A MEAL 60 tablet 0   glucose blood (ACCU-CHEK GUIDE) test strip 1 each by Other route as needed for other. Use as instructed to check blood sugar daily. 100 each 3   LANCETS SUPER THIN 28G MISC Use to check blood sugar once daily. 100 each 3   magic mouthwash (lidocaine, diphenhydrAMINE, alum & mag hydroxide) suspension Swish and spit 5 mLs 3 (three) times daily as needed for mouth pain. 360 mL 0   metFORMIN (GLUCOPHAGE) 500 MG tablet TAKE ONE TABLET BY MOUTH 2 TIMES A DAY WITH A MEAL 180 tablet 0   Multiple Vitamin (MULTIVITAMIN) tablet Take 1  tablet by mouth daily.     Nebivolol HCl (BYSTOLIC) 20 MG TABS Take 1 tablet (20 mg total) by mouth 2 (two) times daily. 180 tablet 2   potassium chloride (K-DUR) 10 MEQ tablet Take 1 tablet (10 mEq total) by mouth every other day. (Patient taking differently: Take 10 mEq by mouth every other day.) 45 tablet 3   tetrahydrozoline-zinc (VISINE-AC) 0.05-0.25 % ophthalmic solution Place 2 drops into both eyes 3 (three) times daily as needed (dry eyes).     No facility-administered medications prior to visit.    Allergies  Allergen Reactions   Demerol [Meperidine] Other (See Comments)    Lost consciousness   Bentyl [Dicyclomine Hcl] Swelling   Invokana [Canagliflozin] Hives   Metformin And Related Diarrhea   Protonix [Pantoprazole Sodium] Swelling   Statins Other (See Comments)    myalgia   Ace Inhibitors Cough    ROS Review of Systems  Constitutional:  Positive for fatigue. Negative for chills and fever.  HENT:  Negative for congestion, sinus pressure, sinus pain and sore throat.   Eyes:  Negative for pain and discharge.  Respiratory:  Positive for shortness of breath. Negative for cough.   Cardiovascular:  Negative for chest pain and palpitations.  Gastrointestinal:  Negative for abdominal pain, constipation, diarrhea, nausea and vomiting.  Endocrine: Negative for polydipsia and polyuria.  Genitourinary:  Negative for  dysuria and hematuria.  Musculoskeletal:  Positive for arthralgias and back pain. Negative for neck pain and neck stiffness.  Skin:  Negative for rash.  Neurological:  Negative for dizziness and weakness.  Psychiatric/Behavioral:  Negative for agitation and behavioral problems.      Objective:    Physical Exam Vitals reviewed.  Constitutional:      General: She is not in acute distress.    Appearance: She is not diaphoretic.  HENT:     Head: Normocephalic and atraumatic.     Nose: Nose normal.     Mouth/Throat:     Mouth: Mucous membranes are moist.  Eyes:      General: No scleral icterus.    Extraocular Movements: Extraocular movements intact.  Cardiovascular:     Rate and Rhythm: Normal rate and regular rhythm.     Pulses: Normal pulses.     Heart sounds: Murmur (Systolic murmur over upper sternal borders) heard.  Pulmonary:     Breath sounds: Normal breath sounds. No wheezing or rales.  Abdominal:     Palpations: Abdomen is soft.     Tenderness: There is no abdominal tenderness.  Musculoskeletal:     Cervical back: Neck supple. No tenderness.     Right lower leg: No edema.     Left lower leg: No edema.  Skin:    General: Skin is warm.     Findings: No rash.  Neurological:     General: No focal deficit present.     Mental Status: She is alert and oriented to person, place, and time.     Cranial Nerves: No cranial nerve deficit.     Sensory: No sensory deficit.     Motor: No weakness.  Psychiatric:        Mood and Affect: Mood normal.        Behavior: Behavior normal.    BP (!) 142/68 (BP Location: Left Arm, Patient Position: Sitting, Cuff Size: Normal)   Pulse 70   Resp 18   Ht 5' 6.5" (1.689 m)   Wt 175 lb (79.4 kg)   SpO2 99%   BMI 27.82 kg/m  Wt Readings from Last 3 Encounters:  08/30/21 175 lb (79.4 kg)  04/26/21 182 lb 1.9 oz (82.6 kg)  02/15/21 178 lb 9.6 oz (81 kg)    Lab Results  Component Value Date   TSH 0.57 01/05/2020   Lab Results  Component Value Date   WBC 10.3 10/25/2020   HGB 11.4 (L) 10/25/2020   HCT 34.9 (L) 10/25/2020   MCV 79.0 (L) 10/25/2020   PLT 204 10/25/2020   Lab Results  Component Value Date   NA 141 04/17/2021   K 3.8 04/17/2021   CO2 24 04/17/2021   GLUCOSE 193 (H) 04/17/2021   BUN 13 04/17/2021   CREATININE 1.06 (H) 04/17/2021   BILITOT 0.5 04/17/2021   ALKPHOS 49 04/17/2021   AST 17 04/17/2021   ALT 18 04/17/2021   PROT 7.0 04/17/2021   ALBUMIN 3.9 04/17/2021   CALCIUM 9.1 04/17/2021   ANIONGAP 9 11/20/2018   EGFR 55 (L) 04/17/2021   GFR 55.96 (L) 02/23/2020    Lab Results  Component Value Date   CHOL 186 04/17/2021   Lab Results  Component Value Date   HDL 62 04/17/2021   Lab Results  Component Value Date   LDLCALC 103 (H) 04/17/2021   Lab Results  Component Value Date   TRIG 116 04/17/2021   Lab Results  Component Value Date  CHOLHDL 3.0 04/17/2021   Lab Results  Component Value Date   HGBA1C 8.2 (A) 04/26/2021      Assessment & Plan:   Encounter for general adult medical examination with abnormal findings Physical exam as documented. Counseling done  re healthy lifestyle involving commitment to 150 minutes exercise per week, heart healthy diet, and attaining healthy weight.The importance of adequate sleep also discussed. Changes in health habits are decided on by the patient with goals and time frames  set for achieving them. Immunization and cancer screening needs are specifically addressed at this visit.   Essential hypertension BP Readings from Last 1 Encounters:  08/30/21 (!) 142/68   Elevated today, needs to follow low salt diet On Amlodipine and Bystolic, may switch Bystolic to ARB as B-blocker can also be causing fatigue Counseled for compliance with the medications Advised DASH diet and moderate exercise/walking, at least 150 mins/week  Uncontrolled type 2 diabetes mellitus with hyperglycemia, without long-term current use of insulin (HCC) Lab Results  Component Value Date   HGBA1C 8.2 (A) 04/26/2021   On Metformin and Glimepiride Did not tolerate Jardiance, Januvia and GLP-1 agonist in the past Needs to follow diabetic diet, admits that she does takes sugary products including soft drinks - Advised to follow diabetic diet F/u CMP and lipid panel Diabetic eye exam: Advised to follow up with Ophthalmology for diabetic eye exam  Chronic fatigue Unclear etiology Check TSH, CMP and Vitamin B12 levels Vestibular exercises for dizziness  Vitamin B12 deficiency On Metformin for a long time Check Vitamin  B12    No orders of the defined types were placed in this encounter.   Follow-up: Return in about 4 months (around 12/29/2021) for HTN and DM.    Lindell Spar, MD

## 2021-08-30 NOTE — Patient Instructions (Addendum)
Please continue current medications for now.  Please schedule appointment with your Cardiologist.  Please perform vestibular exercises at home.  Search on youtube: Inner Ear Balance Home Exercises to Treat Dizziness (Vestibular Home Exercises)

## 2021-08-31 ENCOUNTER — Other Ambulatory Visit: Payer: Self-pay | Admitting: Internal Medicine

## 2021-08-31 DIAGNOSIS — E876 Hypokalemia: Secondary | ICD-10-CM

## 2021-08-31 LAB — CBC WITH DIFFERENTIAL/PLATELET
Basophils Absolute: 0.1 10*3/uL (ref 0.0–0.2)
Basos: 1 %
EOS (ABSOLUTE): 0.2 10*3/uL (ref 0.0–0.4)
Eos: 2 %
Hematocrit: 34.7 % (ref 34.0–46.6)
Hemoglobin: 11.2 g/dL (ref 11.1–15.9)
Immature Grans (Abs): 0 10*3/uL (ref 0.0–0.1)
Immature Granulocytes: 0 %
Lymphocytes Absolute: 2.6 10*3/uL (ref 0.7–3.1)
Lymphs: 30 %
MCH: 25.2 pg — ABNORMAL LOW (ref 26.6–33.0)
MCHC: 32.3 g/dL (ref 31.5–35.7)
MCV: 78 fL — ABNORMAL LOW (ref 79–97)
Monocytes Absolute: 0.6 10*3/uL (ref 0.1–0.9)
Monocytes: 7 %
Neutrophils Absolute: 5.3 10*3/uL (ref 1.4–7.0)
Neutrophils: 60 %
Platelets: 255 10*3/uL (ref 150–450)
RBC: 4.45 x10E6/uL (ref 3.77–5.28)
RDW: 15.2 % (ref 11.7–15.4)
WBC: 8.8 10*3/uL (ref 3.4–10.8)

## 2021-08-31 LAB — COMPREHENSIVE METABOLIC PANEL
ALT: 12 IU/L (ref 0–32)
AST: 17 IU/L (ref 0–40)
Albumin/Globulin Ratio: 1.4 (ref 1.2–2.2)
Albumin: 4.1 g/dL (ref 3.7–4.7)
Alkaline Phosphatase: 57 IU/L (ref 44–121)
BUN/Creatinine Ratio: 7 — ABNORMAL LOW (ref 12–28)
BUN: 7 mg/dL — ABNORMAL LOW (ref 8–27)
Bilirubin Total: 0.6 mg/dL (ref 0.0–1.2)
CO2: 24 mmol/L (ref 20–29)
Calcium: 9.6 mg/dL (ref 8.7–10.3)
Chloride: 102 mmol/L (ref 96–106)
Creatinine, Ser: 1.02 mg/dL — ABNORMAL HIGH (ref 0.57–1.00)
Globulin, Total: 3 g/dL (ref 1.5–4.5)
Glucose: 148 mg/dL — ABNORMAL HIGH (ref 70–99)
Potassium: 3.4 mmol/L — ABNORMAL LOW (ref 3.5–5.2)
Sodium: 142 mmol/L (ref 134–144)
Total Protein: 7.1 g/dL (ref 6.0–8.5)
eGFR: 58 mL/min/{1.73_m2} — ABNORMAL LOW (ref >59)

## 2021-08-31 LAB — LIPID PANEL
Chol/HDL Ratio: 3 ratio (ref 0.0–4.4)
Cholesterol, Total: 203 mg/dL — ABNORMAL HIGH (ref 100–199)
HDL: 67 mg/dL (ref >39)
LDL Chol Calc (NIH): 116 mg/dL — ABNORMAL HIGH (ref 0–99)
Triglycerides: 116 mg/dL (ref 0–149)
VLDL Cholesterol Cal: 20 mg/dL (ref 5–40)

## 2021-08-31 LAB — TSH: TSH: 1.16 u[IU]/mL (ref 0.450–4.500)

## 2021-08-31 LAB — VITAMIN B12: Vitamin B-12: 434 pg/mL (ref 232–1245)

## 2021-08-31 LAB — HEMOGLOBIN A1C
Est. average glucose Bld gHb Est-mCnc: 163 mg/dL
Hgb A1c MFr Bld: 7.3 % — ABNORMAL HIGH (ref 4.8–5.6)

## 2021-08-31 MED ORDER — POTASSIUM CHLORIDE ER 10 MEQ PO TBCR: 10.0000 meq | EXTENDED_RELEASE_TABLET | Freq: Every day | ORAL | 1 refills | Status: DC

## 2021-09-18 ENCOUNTER — Other Ambulatory Visit: Payer: Self-pay | Admitting: Internal Medicine

## 2021-09-18 DIAGNOSIS — E1165 Type 2 diabetes mellitus with hyperglycemia: Secondary | ICD-10-CM

## 2021-11-01 ENCOUNTER — Other Ambulatory Visit: Payer: Self-pay | Admitting: Internal Medicine

## 2021-11-01 DIAGNOSIS — E1165 Type 2 diabetes mellitus with hyperglycemia: Secondary | ICD-10-CM

## 2021-11-02 ENCOUNTER — Other Ambulatory Visit: Payer: Self-pay | Admitting: Internal Medicine

## 2021-11-02 DIAGNOSIS — E1165 Type 2 diabetes mellitus with hyperglycemia: Secondary | ICD-10-CM

## 2021-11-06 ENCOUNTER — Other Ambulatory Visit: Payer: Self-pay | Admitting: Internal Medicine

## 2021-11-06 DIAGNOSIS — I10 Essential (primary) hypertension: Secondary | ICD-10-CM

## 2021-11-06 MED ORDER — BISOPROLOL FUMARATE 10 MG PO TABS: 10.0000 mg | ORAL_TABLET | Freq: Every day | ORAL | 5 refills | Status: DC

## 2021-11-19 ENCOUNTER — Other Ambulatory Visit: Payer: Self-pay | Admitting: Internal Medicine

## 2021-12-20 ENCOUNTER — Other Ambulatory Visit: Payer: Self-pay | Admitting: Internal Medicine

## 2021-12-25 ENCOUNTER — Other Ambulatory Visit: Payer: Self-pay | Admitting: Internal Medicine

## 2021-12-25 DIAGNOSIS — E1165 Type 2 diabetes mellitus with hyperglycemia: Secondary | ICD-10-CM

## 2021-12-31 ENCOUNTER — Encounter: Payer: Self-pay | Admitting: Internal Medicine

## 2021-12-31 ENCOUNTER — Ambulatory Visit: Payer: Medicare Other | Admitting: Internal Medicine

## 2021-12-31 VITALS — BP 140/88 | HR 98 | Resp 18 | Ht 66.5 in | Wt 179.8 lb

## 2021-12-31 DIAGNOSIS — E782 Mixed hyperlipidemia: Secondary | ICD-10-CM

## 2021-12-31 DIAGNOSIS — E1165 Type 2 diabetes mellitus with hyperglycemia: Secondary | ICD-10-CM

## 2021-12-31 DIAGNOSIS — E1143 Type 2 diabetes mellitus with diabetic autonomic (poly)neuropathy: Secondary | ICD-10-CM | POA: Diagnosis not present

## 2021-12-31 DIAGNOSIS — K580 Irritable bowel syndrome with diarrhea: Secondary | ICD-10-CM | POA: Diagnosis not present

## 2021-12-31 DIAGNOSIS — K219 Gastro-esophageal reflux disease without esophagitis: Secondary | ICD-10-CM

## 2021-12-31 DIAGNOSIS — I10 Essential (primary) hypertension: Secondary | ICD-10-CM

## 2021-12-31 LAB — POCT GLYCOSYLATED HEMOGLOBIN (HGB A1C): HbA1c, POC (controlled diabetic range): 8.8 % — AB (ref 0.0–7.0)

## 2021-12-31 MED ORDER — NEBIVOLOL HCL 20 MG PO TABS: 20.0000 mg | ORAL_TABLET | Freq: Every day | ORAL | 1 refills | Status: DC

## 2021-12-31 MED ORDER — FAMOTIDINE 20 MG PO TABS: 20.0000 mg | ORAL_TABLET | Freq: Two times a day (BID) | ORAL | 5 refills | Status: DC

## 2021-12-31 MED ORDER — GABAPENTIN 100 MG PO CAPS: 100.0000 mg | ORAL_CAPSULE | Freq: Every day | ORAL | 1 refills | Status: DC

## 2021-12-31 MED ORDER — AMLODIPINE BESYLATE 5 MG PO TABS: 5.0000 mg | ORAL_TABLET | Freq: Every day | ORAL | 1 refills | Status: DC

## 2021-12-31 NOTE — Patient Instructions (Addendum)
Please start taking Nebivolol and stop taking Zebeta - Bisoprolol. ? ?Please start taking Amlodipine for BP. ? ?Please take Gabapentin at nighttime for neuropathy. ? ?Please continue to follow low carb diet and ambulate as tolerated. ?

## 2022-01-03 LAB — MICROALBUMIN / CREATININE URINE RATIO
Creatinine, Urine: 164.5 mg/dL
Microalb/Creat Ratio: 42 mg/g creat — ABNORMAL HIGH (ref 0–29)
Microalbumin, Urine: 68.4 ug/mL

## 2022-01-04 NOTE — Assessment & Plan Note (Signed)
On Pepcid 

## 2022-01-04 NOTE — Progress Notes (Signed)
? ?Established Patient Office Visit ? ?Subjective:  ?Patient ID: [name], female    DOB: 1946/02/09  Age: 76 y.o. MRN: [id number] ? ?CC:  ?Chief Complaint  ?Patient presents with  ? Follow-up  ?  4 month follow up HTN and DM pt just doesn't feel good having loose stools and having neuropathy in legs and feet  ? ? ?HPI ?[name] is a 76 y.o. female with past medical history of HTN, DM, breast ca s/p right mastectomy and chemotherapy, HLD, DDD of lumbar spine and GERD who presents for f/u of her chronic medical conditions. ? ?Her BP was mildly elevated today.  She has not been taking amlodipine.  She states that her insurance is not going to cover Zebeta now.  She denies any headache, chest pain, dyspnea or palpitations. ?  ?She takes Metformin and glipizide for DM. Her HbA1C was 8.8. She denies polyuria or polydipsia. She did not tolerate Januvia, Jardiance and Trulicity in the past. She did not tolerate statin in the past and is taking Nexletol, sent by lipid clinic.  She complains of chronic fatigue and also reports having neuropathic pain in her legs and feet, which have been chronic.  She was offered treatment for DM neuropathy in the past, but she refused it.  She agrees to start Gabapentin now. ? ?Past Medical History:  ?Diagnosis Date  ? Abdominal hernia 02/26/12  ? unrepaired  ? 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 right / chemo + tamoxifen x 5 years   ? Anemia   ? Anxiety   ? Aortic atherosclerosis ([place])   ? Arthritis   ? Blood transfusion 1980's  ? Breast cancer ([place])   ? Cancer [facility])   ? Phreesia 08/18/2020  ? Complication of anesthesia   ? has a hard time waking up; can't lay flat  ? Degenerative disc disease, lumbar   ? pressing on L3 and L4  ? Diabetes mellitus ([place]) 1989  ? Type 2 NIDDM; "cancer treatment gave me diabetes"  ? Diabetes mellitus without complication ([place])   ? Phreesia 08/18/2020  ? Diverticulosis   ? DJD (degenerative joint disease) of lumbar spine   ? Dysrhythmia   ?  "irregular"  ? Exertional dyspnea   ? Gastroparesis   ? GERD (gastroesophageal reflux disease)   ? mann  ? Heart murmur   ? "think I outgrew it"  ? Hepatic steatosis   ? History of kidney stones   ? History of stomach ulcers   ? Hx: UTI (urinary tract infection)   ? Hyperlipidemia   ? Hypersensitivity   ? in tongue  ? Hypertension   ? IBS (irritable bowel syndrome)   ? IBS (irritable bowel syndrome)   ? Insomnia   ? Internal hemorrhoids   ? Kidney cysts   ? RT KIDNEY  ? Kidney stones 2009  ? s/p lithotripsy  ? Nephrolithiasis 04/13/2014  ? Obesity   ? PONV (postoperative nausea and vomiting)   ? Pulmonary nodule   ? Shortness of breath   ? unable to lie flat  ? Ventral hernia   ? ? ?Past Surgical History:  ?Procedure Laterality Date  ? ABDOMINAL HYSTERECTOMY  2002  ? APPENDECTOMY    ? BACK SURGERY    ? Can't take any medical procedure in right arm  ? BREAST BIOPSY    ? right  ? BREAST SURGERY N/A   ? Phreesia 04/22/2020  ? CATARACT EXTRACTION W/ INTRAOCULAR LENS  IMPLANT, BILATERAL  2009-2010  ? [place]; 1978; 1981  ? CESAREAN SECTION N/A   ? Phreesia 04/22/2020  ? COLONOSCOPY WITH PROPOFOL N/A 10/29/2016  ? Procedure: COLONOSCOPY WITH PROPOFOL;  Surgeon: Danie Binder, MD;  [facility];  Service: Endoscopy;  Laterality: N/A;  10:00 am  ? CYSTOSCOPY Left 04/13/2014  ? Procedure: CYSTOSCOPY FLEXIBLE;  Surgeon: Ardis Hughs, MD;  [facility];  Service: Urology;  Laterality: Left;  with STENT  ? ESOPHAGOGASTRODUODENOSCOPY (EGD) WITH PROPOFOL N/A 10/29/2016  ? Procedure: ESOPHAGOGASTRODUODENOSCOPY (EGD) WITH PROPOFOL;  Surgeon: Danie Binder, MD;  [facility];  Service: Endoscopy;  Laterality: N/A;  ? EYE SURGERY    ? implant and screws put in the right lower jaw  11/2008  ? dental surgery  ? kidney blockage  ?1990's  ? " major surgery ;put kidney on pump for awhile"  ? LITHOTRIPSY    ? "several times"  ? LUMBAR FUSION  08/2010  ? MASTECTOMY  1997  ? right  ? NEPHROLITHOTOMY  Left 04/13/2014  ? Procedure: LEFT PERCUTANEOUS NEPHROLITHOTOMY WITH SURGEON ACCESS;  Surgeon: Ardis Hughs, MD;  [facility];  Service: Urology;  Laterality: Left;  ? OVARIAN CYST SURGERY    ? PARATHYROIDECTOMY  02/26/2012  ? Procedure: PARATHYROIDECTOMY;  Surgeon: Ascencion Dike, MD;  Location: [place] [facility];  Service: ENT;;  ? SAVORY DILATION N/A 10/29/2016  ? Procedure: SAVORY DILATION;  Surgeon: Danie Binder, MD;  [facility];  Service: Endoscopy;  Laterality: N/A;  ? [place]  2011  ? urological surgery for blocked ureter secondary to kidney stone    ? ? ?Family History  ?Problem Relation Age of Onset  ? Birth defects Mother   ? Heart attack Mother   ? Cancer Brother   ?     Esophageal; smoker; deceased 48s  ? Colon cancer Brother   ? Diabetes Daughter   ? Leukemia Paternal Aunt   ?     deceased 44s  ? Pancreatic cancer Paternal Aunt   ?     deceased 30  ? Cancer Paternal Aunt   ?     GI cancer; deceased 50s  ? Cancer Cousin   ?     kidney cancer; deceased 61; pat first cousin; daughter of aunt w/ leukemia  ? Cancer Cousin   ?     colon cancer @ 89; pat first cousin; son of aunt with GI cancer  ? Anesthesia problems Neg Hx   ? ? ?Social History  ? ?Socioeconomic History  ? Marital status: Married  ?  Spouse name: Not on file  ? Number of children: 3  ? Years of education: Not on file  ? Highest education level: Not on file  ?Occupational History  ? Occupation: retired in 2011  ?Tobacco Use  ? Smoking status: Never  ? Smokeless tobacco: Never  ?Vaping Use  ? Vaping Use: Never used  ?Substance and Sexual Activity  ? Alcohol use: Yes  ?  Alcohol/week: 0.0 standard drinks  ?  Comment: X2 DRINKS PER YEAR  ? Drug use: No  ? Sexual activity: Not Currently  ?  Birth control/protection: Surgical  ?Other Topics Concern  ? Not on file  ?Social History Narrative  ? Not on file  ? ?Social Determinants of Health  ? ?Financial Resource Strain: Not on file  ?Food Insecurity: Not on file  ?Transportation Needs: No  Transportation Needs  ? Lack of Transportation (Medical): No  ?  Lack of Transportation (Non-Medical): No  ?Physical Activity: Not on file  ?Stress: Not on file  ?Social Connections: Not on file  ?Intimate Partner Violence: Not on file  ? ? ?Outpatient Medications Prior to Visit  ?Medication Sig Dispense Refill  ? acetaminophen (TYLENOL) 325 MG tablet Take 325 mg by mouth every 6 (six) hours as needed for mild pain.    ? Bempedoic Acid (NEXLETOL) 180 MG TABS Take 1 tablet by mouth every other day. 30 tablet 11  ? Blood Glucose Monitoring Suppl (BLOOD GLUCOSE SYSTEM PAK) KIT Use as directed to monitor FSBS 1x daily. Dx: E11.9 1 each 1  ? Cholecalciferol (VITAMIN D3 PO) Take 1 tablet by mouth daily.    ? CINNAMON PO Take 1 capsule by mouth. Takes sometimes    ? furosemide (LASIX) 40 MG tablet Take 1 tablet (40 mg total) by mouth every other day. (Patient taking differently: Take 40 mg by mouth as needed.) 45 tablet 3  ? glipiZIDE (GLUCOTROL) 10 MG tablet TAKE (1) TABLET BY MOUTH TWICE DAILY BEFORE A MEAL 60 tablet 0  ? glucose blood (ACCU-CHEK GUIDE) test strip 1 each by Other route as needed for other. Use as instructed to check blood sugar daily. 100 each 3  ? LANCETS SUPER THIN 28G MISC Use to check blood sugar once daily. 100 each 3  ? metFORMIN (GLUCOPHAGE) 500 MG tablet TAKE 1 TABLET BY MOUTH TWICE A DAY WITH A MEAL. 180 tablet 0  ? Multiple Vitamin (MULTIVITAMIN) tablet Take 1 tablet by mouth daily.    ? potassium chloride (KLOR-CON) 10 MEQ tablet Take 1 tablet (10 mEq total) by mouth daily. 90 tablet 1  ? amLODipine (NORVASC) 5 MG tablet Take 1 tablet (5 mg total) by mouth daily. 90 tablet 0  ? bisoprolol (ZEBETA) 10 MG tablet Take 1 tablet (10 mg total) by mouth daily. 30 tablet 5  ? famotidine (PEPCID) 20 MG tablet Take 1 tablet (20 mg total) by mouth 2 (two) times daily. NEEDS OFFICE VISIT FOR FURTHER REFILLS 60 tablet 1  ? magic mouthwash (lidocaine, diphenhydrAMINE, alum & mag hydroxide) suspension Swish  and spit 5 mLs 3 (three) times daily as needed for mouth pain. 360 mL 0  ? tetrahydrozoline-zinc (VISINE-AC) 0.05-0.25 % ophthalmic solution Place 2 drops into both eyes 3 (three) times daily as needed (dr

## 2022-01-04 NOTE — Assessment & Plan Note (Signed)
BP Readings from Last 1 Encounters:  ?12/31/21 140/88  ? ?Uncontrolled, needs to follow low salt diet ?On Zebeta only currently ?Refilled amlodipine ?Switched from Dent to generic nebivolol 20 mg QD, she had tolerated Bystolic well in the past ?Counseled for compliance with the medications ?Advised DASH diet and moderate exercise/walking, at least 150 mins/week ?

## 2022-01-04 NOTE — Assessment & Plan Note (Signed)
Lab Results  ?Component Value Date  ? HGBA1C 8.8 (A) 12/31/2021  ? ?On Metformin and Glimepiride ?Did not tolerate Jardiance, Januvia and GLP-1 agonist in the past ?Needs to follow diabetic diet, admits that she does take sugary products including soft drinks - Advised to follow diabetic diet ?F/u CMP and lipid panel ?Diabetic eye exam: Advised to follow up with Ophthalmology for diabetic eye exam ?

## 2022-01-04 NOTE — Assessment & Plan Note (Signed)
Started gabapentin 100 mg nightly for now ?

## 2022-01-04 NOTE — Assessment & Plan Note (Signed)
Follows up with lipid clinic ?On Nexletol ?Has statin induced myopathy ?

## 2022-02-08 ENCOUNTER — Telehealth: Payer: Self-pay

## 2022-02-08 ENCOUNTER — Encounter: Payer: Self-pay | Admitting: Nurse Practitioner

## 2022-02-08 ENCOUNTER — Ambulatory Visit: Payer: Medicare Other | Admitting: Nurse Practitioner

## 2022-02-08 ENCOUNTER — Ambulatory Visit (HOSPITAL_COMMUNITY)
Admission: RE | Admit: 2022-02-08 | Discharge: 2022-02-08 | Disposition: A | Discharge status: Home / Self Care | Payer: Medicare Other | Source: Ambulatory Visit | Attending: Nurse Practitioner | Admitting: Nurse Practitioner

## 2022-02-08 VITALS — BP 133/79 | HR 99 | Ht 66.0 in | Wt 178.0 lb

## 2022-02-08 DIAGNOSIS — K580 Irritable bowel syndrome with diarrhea: Secondary | ICD-10-CM | POA: Diagnosis not present

## 2022-02-08 DIAGNOSIS — N3281 Overactive bladder: Secondary | ICD-10-CM

## 2022-02-08 DIAGNOSIS — M7989 Other specified soft tissue disorders: Secondary | ICD-10-CM

## 2022-02-08 DIAGNOSIS — R5382 Chronic fatigue, unspecified: Secondary | ICD-10-CM

## 2022-02-08 DIAGNOSIS — M25562 Pain in left knee: Secondary | ICD-10-CM | POA: Diagnosis present

## 2022-02-08 MED ORDER — DICLOFENAC SODIUM 1 % EX GEL: 4.0000 g | Freq: Four times a day (QID) | CUTANEOUS | 1 refills | Status: DC

## 2022-02-08 MED ORDER — MIRABEGRON ER 25 MG PO TB24: 25.0000 mg | ORAL_TABLET | Freq: Every day | ORAL | 1 refills | Status: DC

## 2022-02-08 NOTE — Progress Notes (Addendum)
[name]     MRN: [id number]      DOB: [birth year]   HPI [name] with past medical history of uncontrolled type 2 diabetes, IBS, thyromegaly, malignant neoplasm of female breast is here for complaint of leg swelling, dizziness, chronic urinary incontinence, chronic diarrhea and fall   IBS pt c/o states that she has been having diarhea ever since she had treatment for breast cancer over 20 years ago ,if she eats any food she has about 5 loose bowel movement daily after eating , She was previously seeing Dr Roselyn Meier, GI specilaist at [facility] gastro but he has left the practice.  Denies bloody stool, melena.  Takes Imodium as needed.   Overactive bladder. she has been having urinary incontinece and urgency  since over the past 2 years, she has been having to wearing  incontinece pads daily.  Patient denies hesitancy, fever, chills.  States that she has never used any medication in the past for her condition  .  Patient c/o dizziness, SOB , intermittent chest tightness, since the past one year, the more she talk the more SOB she gets . Activity makes her symptoms worse.never smoked , she was told that she had heart mumur when she was a child.    Has right leg sweeling since the past two months, has some tingling , burining sensation, toe feels numb. Has chronic  bilateral foot neuropathy recently started on gabapentin.  Patient states that she has not been taking Lasix.   Fall at home on 4 days ago, landed on her right side, has been haing left knee pain since then,  pain is 8/10 sometimes , she has been wearing knee brace, taking Aleve as needed.     ROS Denies recent fever or chills. Denies sinus pressure, nasal congestion, ear pain or sore throat. Denies chest congestion, productive cough or wheezing. Denies depression, anxiety or insomnia.    PE  BP 133/79 (BP Location: Left Arm, Patient Position: Sitting, Cuff Size: Large)   Pulse 99   Ht '5\' 6"'$  (1.676 m)   Wt 178 lb  (80.7 kg)   SpO2 99%   BMI 28.73 kg/m   Patient alert and oriented and in no cardiopulmonary distress.   Chest: Clear to auscultation bilaterally.  CVS: S1, S2 no murmurs, no S3.Regular rate.  ABD: Soft non tender.   MS: Adequate ROM spine, shoulders, hips. right leg +2 edema, right leg is warm tender on palpation has +2 pedal pulses no skin discoloration noted.  Left knee tenderness on palpation and range of motion, left leg warm to touch has palpable pulse.  Psych: Good eye contact, normal affect. Memory intact not anxious or depressed appearing.  CNS: , power intact ,  normal throughout.no focal deficits noted.   Assessment & Plan  Overactive bladder Chronic condition Start Myrbetriq 25 mg daily   IBS (irritable bowel syndrome) Chronic medical condition On Imodium as needed for diarrhea Patient told to follow-up with GI  Left knee pain Due to fall Stat x-ray of the knee ordered to rule out fracture Voltaren gel 4 g 4 times daily as needed for pain.  OTC Tylenol as needed Continue knee brace Apply heat as needed for pain  Right leg swelling Swelling since the past 2 months. +2 pitting edema right leg Stat venous ultrasound ordered to rule out DVT  Chronic fatigue Chronic condition of unclear etiology. Last visit to cardiology was in 2021 with plans to follow up with pt in a  year.  Per cardiology Most current echo showed normal LVEF, most recent Myoview was normal  At next visit will advise pt to follow up with cardiology for possible repeat  echocardiogram

## 2022-02-08 NOTE — Addendum Note (Signed)
Addended by: Renee Rival on: 02/08/2022 04:49 PM   Modules accepted: Orders

## 2022-02-08 NOTE — Assessment & Plan Note (Addendum)
Chronic condition of unclear etiology. Last visit to cardiology was in 2021 with plans to follow up with pt in a year.  Per cardiology Most current echo showed normal LVEF, most recent Myoview was normal  At next visit will advise pt to follow up with cardiology for possible repeat  echocardiogram

## 2022-02-08 NOTE — Addendum Note (Signed)
Addended by: Renee Rival on: 02/08/2022 04:40 PM   Modules accepted: Orders

## 2022-02-08 NOTE — Assessment & Plan Note (Signed)
Chronic medical condition On Imodium as needed for diarrhea Patient told to follow-up with GI

## 2022-02-08 NOTE — Patient Instructions (Addendum)
Please start taking mybetriq '25mg'$  daily for your overactive bladder  Please get the ultrasound of your leg and xray of your knee done as discussed.   Apply voltaren gel four times daily as needed  to your left knee

## 2022-02-08 NOTE — Assessment & Plan Note (Signed)
Swelling since the past 2 months. +2 pitting edema right leg Stat venous ultrasound ordered to rule out DVT

## 2022-02-08 NOTE — Assessment & Plan Note (Addendum)
Due to fall Stat x-ray of the knee ordered to rule out fracture Voltaren gel 4 g 4 times daily as needed for pain.  OTC Tylenol as needed Continue knee brace Apply heat as needed for pain

## 2022-02-08 NOTE — Telephone Encounter (Signed)
Called pt to advised of ultrasound scheduled for 5/22 no answer left vm

## 2022-02-08 NOTE — Assessment & Plan Note (Signed)
Chronic condition Start Myrbetriq 25 mg daily

## 2022-02-10 NOTE — Progress Notes (Signed)
No evidence of fracture, dislocation, or joint effusion. No evidence of arthropathy or other focal bone abnormality. Soft tissues are Unremarkable.  Pt should use voltaren gel as ordered, apply heat as needed. Patient may take tylenol '650mg'$  every 6 hours as needed for pain as well.

## 2022-02-11 ENCOUNTER — Ambulatory Visit (HOSPITAL_COMMUNITY)
Admission: RE | Admit: 2022-02-11 | Discharge: 2022-02-11 | Disposition: A | Discharge status: Home / Self Care | Payer: Medicare Other | Source: Ambulatory Visit | Attending: Hematology | Admitting: Hematology

## 2022-02-11 ENCOUNTER — Ambulatory Visit (HOSPITAL_COMMUNITY)
Admission: RE | Admit: 2022-02-11 | Discharge: 2022-02-11 | Disposition: A | Discharge status: Home / Self Care | Payer: Medicare Other | Source: Ambulatory Visit | Attending: Nurse Practitioner | Admitting: Nurse Practitioner

## 2022-02-11 ENCOUNTER — Inpatient Hospital Stay (HOSPITAL_COMMUNITY): Payer: Medicare Other | Attending: Hematology

## 2022-02-11 DIAGNOSIS — M79604 Pain in right leg: Secondary | ICD-10-CM | POA: Insufficient documentation

## 2022-02-11 DIAGNOSIS — Z9221 Personal history of antineoplastic chemotherapy: Secondary | ICD-10-CM | POA: Diagnosis not present

## 2022-02-11 DIAGNOSIS — E559 Vitamin D deficiency, unspecified: Secondary | ICD-10-CM | POA: Insufficient documentation

## 2022-02-11 DIAGNOSIS — C50919 Malignant neoplasm of unspecified site of unspecified female breast: Secondary | ICD-10-CM

## 2022-02-11 DIAGNOSIS — M8580 Other specified disorders of bone density and structure, unspecified site: Secondary | ICD-10-CM | POA: Insufficient documentation

## 2022-02-11 DIAGNOSIS — R197 Diarrhea, unspecified: Secondary | ICD-10-CM | POA: Diagnosis not present

## 2022-02-11 DIAGNOSIS — M7989 Other specified soft tissue disorders: Secondary | ICD-10-CM | POA: Insufficient documentation

## 2022-02-11 DIAGNOSIS — Z853 Personal history of malignant neoplasm of breast: Secondary | ICD-10-CM | POA: Insufficient documentation

## 2022-02-11 DIAGNOSIS — Z79899 Other long term (current) drug therapy: Secondary | ICD-10-CM | POA: Insufficient documentation

## 2022-02-11 DIAGNOSIS — D649 Anemia, unspecified: Secondary | ICD-10-CM | POA: Diagnosis not present

## 2022-02-11 DIAGNOSIS — Z1231 Encounter for screening mammogram for malignant neoplasm of breast: Secondary | ICD-10-CM | POA: Diagnosis not present

## 2022-02-11 DIAGNOSIS — Z9011 Acquired absence of right breast and nipple: Secondary | ICD-10-CM | POA: Diagnosis not present

## 2022-02-11 DIAGNOSIS — Z170 Estrogen receptor positive status [ER+]: Secondary | ICD-10-CM | POA: Insufficient documentation

## 2022-02-11 LAB — CBC WITH DIFFERENTIAL/PLATELET
Abs Immature Granulocytes: 0.02 10*3/uL (ref 0.00–0.07)
Basophils Absolute: 0 10*3/uL (ref 0.0–0.1)
Basophils Relative: 1 %
Eosinophils Absolute: 0.1 10*3/uL (ref 0.0–0.5)
Eosinophils Relative: 2 %
HCT: 36.5 % (ref 36.0–46.0)
Hemoglobin: 11.5 g/dL — ABNORMAL LOW (ref 12.0–15.0)
Immature Granulocytes: 0 %
Lymphocytes Relative: 39 %
Lymphs Abs: 2.7 10*3/uL (ref 0.7–4.0)
MCH: 25.2 pg — ABNORMAL LOW (ref 26.0–34.0)
MCHC: 31.5 g/dL (ref 30.0–36.0)
MCV: 79.9 fL — ABNORMAL LOW (ref 80.0–100.0)
Monocytes Absolute: 0.7 10*3/uL (ref 0.1–1.0)
Monocytes Relative: 9 %
Neutro Abs: 3.5 10*3/uL (ref 1.7–7.7)
Neutrophils Relative %: 49 %
Platelets: 260 10*3/uL (ref 150–400)
RBC: 4.57 MIL/uL (ref 3.87–5.11)
RDW: 15.1 % (ref 11.5–15.5)
WBC: 7 10*3/uL (ref 4.0–10.5)
nRBC: 0 % (ref 0.0–0.2)

## 2022-02-11 LAB — COMPREHENSIVE METABOLIC PANEL
ALT: 17 U/L (ref 0–44)
AST: 18 U/L (ref 15–41)
Albumin: 3.8 g/dL (ref 3.5–5.0)
Alkaline Phosphatase: 53 U/L (ref 38–126)
Anion gap: 7 (ref 5–15)
BUN: 12 mg/dL (ref 8–23)
CO2: 26 mmol/L (ref 22–32)
Calcium: 9.2 mg/dL (ref 8.9–10.3)
Chloride: 105 mmol/L (ref 98–111)
Creatinine, Ser: 0.99 mg/dL (ref 0.44–1.00)
GFR, Estimated: 59 mL/min — ABNORMAL LOW (ref >60)
Glucose, Bld: 221 mg/dL — ABNORMAL HIGH (ref 70–99)
Potassium: 3.3 mmol/L — ABNORMAL LOW (ref 3.5–5.1)
Sodium: 138 mmol/L (ref 135–145)
Total Bilirubin: 0.6 mg/dL (ref 0.3–1.2)
Total Protein: 7.5 g/dL (ref 6.5–8.1)

## 2022-02-11 LAB — IRON AND TIBC
Iron: 45 ug/dL (ref 28–170)
Saturation Ratios: 12 % (ref 10.4–31.8)
TIBC: 383 ug/dL (ref 250–450)
UIBC: 338 ug/dL

## 2022-02-11 LAB — FOLATE: Folate: 7 ng/mL (ref >5.9)

## 2022-02-11 LAB — VITAMIN B12: Vitamin B-12: 248 pg/mL (ref 180–914)

## 2022-02-11 LAB — VITAMIN D 25 HYDROXY (VIT D DEFICIENCY, FRACTURES): Vit D, 25-Hydroxy: 23.96 ng/mL — ABNORMAL LOW (ref 30–100)

## 2022-02-11 LAB — LACTATE DEHYDROGENASE: LDH: 126 U/L (ref 98–192)

## 2022-02-11 LAB — FERRITIN: Ferritin: 21 ng/mL (ref 11–307)

## 2022-02-12 ENCOUNTER — Other Ambulatory Visit: Payer: Self-pay | Admitting: Nurse Practitioner

## 2022-02-12 DIAGNOSIS — E876 Hypokalemia: Secondary | ICD-10-CM

## 2022-02-12 MED ORDER — FUROSEMIDE 40 MG PO TABS: 40.0000 mg | ORAL_TABLET | ORAL | 3 refills | Status: DC

## 2022-02-12 MED ORDER — POTASSIUM CHLORIDE ER 10 MEQ PO TBCR: 10.0000 meq | EXTENDED_RELEASE_TABLET | Freq: Every day | ORAL | 1 refills | Status: DC

## 2022-02-12 NOTE — Telephone Encounter (Signed)
Pt went to appt

## 2022-02-15 ENCOUNTER — Other Ambulatory Visit: Payer: Self-pay | Admitting: Internal Medicine

## 2022-02-15 DIAGNOSIS — E1165 Type 2 diabetes mellitus with hyperglycemia: Secondary | ICD-10-CM

## 2022-02-19 ENCOUNTER — Inpatient Hospital Stay (HOSPITAL_BASED_OUTPATIENT_CLINIC_OR_DEPARTMENT_OTHER): Payer: Medicare Other | Admitting: Hematology

## 2022-02-19 VITALS — BP 137/93 | HR 93 | Temp 98.1°F | Resp 18 | Ht 66.0 in | Wt 178.4 lb

## 2022-02-19 DIAGNOSIS — Z853 Personal history of malignant neoplasm of breast: Secondary | ICD-10-CM | POA: Diagnosis not present

## 2022-02-19 DIAGNOSIS — M8580 Other specified disorders of bone density and structure, unspecified site: Secondary | ICD-10-CM | POA: Diagnosis not present

## 2022-02-19 DIAGNOSIS — Z170 Estrogen receptor positive status [ER+]: Secondary | ICD-10-CM | POA: Diagnosis not present

## 2022-02-19 DIAGNOSIS — C50919 Malignant neoplasm of unspecified site of unspecified female breast: Secondary | ICD-10-CM

## 2022-02-19 NOTE — Progress Notes (Signed)
[place] [address] [place] [address]   Patient Care Team: Renee Rival, FNP as PCP - General (Nurse Practitioner) Fay Records, MD as PCP - Cardiology (Cardiology) Ned Grace, DDS (Inactive) as Consulting Physician (Dentistry) Tuchman, Leslye Peer, DPM (Inactive) as Consulting Physician (Podiatry) Lora Havens, MD (Ophthalmology) Leta Baptist, MD as Attending Physician (Otolaryngology) Edythe Clarity, [facility] as Pharmacist (Pharmacist)  SUMMARY OF ONCOLOGIC HISTORY: Oncology History  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 [facility])  02/09/2008 Initial Diagnosis   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 ([place])    07/06/2014 Genetic Testing   Testing was normal and did not reveal a mutation in these genes. The genes tested were ATM, BARD1, BRCA1, BRCA2, BRIP1, CDH1, CHEK2, MRE11A, MUTYH, NBN, NF1, PALB2, PTEN, RAD50, RAD51C, RAD51D, and TP53.      CHIEF COMPLIANT: Follow-up for stage IA (T1cN0M0) right breast cancer, ER+/PR+ (diagnosed in 1997)   INTERVAL HISTORY: [name] is a 76 y.o. female here today for follow up of her stage IA (T1cN0M0) right breast cancer, ER+/PR+ (diagnosed in 1997). Her last visit was on 02/15/2021.   Today she reports feeling well. She takes calcium and vitamin D irregularly. She is not taking iron tablets. She reports diarrhea following eating and drinking for which she takes Imodium. She drinks Ensure, [employer], [employer], and [employer].   REVIEW OF SYSTEMS:   Review of Systems  Constitutional:  Positive for fatigue. Negative for appetite change.  Respiratory:  Positive for shortness of breath.   Cardiovascular:  Positive for leg swelling.  Gastrointestinal:  Positive for diarrhea and nausea.  Neurological:  Positive for dizziness and numbness.  Psychiatric/Behavioral:  Positive for sleep disturbance.   All other systems reviewed and are negative.  I have reviewed the past medical history, past surgical  history, social history and family history with the patient and they are unchanged from previous note.   ALLERGIES:   is allergic to demerol [meperidine], bentyl [dicyclomine hcl], invokana [canagliflozin], metformin and related, protonix [pantoprazole sodium], statins, and ace inhibitors.   MEDICATIONS:  Current Outpatient Medications  Medication Sig Dispense Refill   acetaminophen (TYLENOL) 325 MG tablet Take 325 mg by mouth every 6 (six) hours as needed for mild pain.     amLODipine (NORVASC) 5 MG tablet Take 1 tablet (5 mg total) by mouth daily. 90 tablet 1   Bempedoic Acid (NEXLETOL) 180 MG TABS Take 1 tablet by mouth every other day. 30 tablet 11   Blood Glucose Monitoring Suppl (BLOOD GLUCOSE SYSTEM PAK) KIT Use as directed to monitor FSBS 1x daily. Dx: E11.9 1 each 1   Cholecalciferol (VITAMIN D3 PO) Take 1 tablet by mouth daily.     CINNAMON PO Take 1 capsule by mouth. Takes sometimes     diclofenac Sodium (VOLTAREN) 1 % GEL Apply 4 g topically 4 (four) times daily. 4 g 1   famotidine (PEPCID) 20 MG tablet Take 1 tablet (20 mg total) by mouth 2 (two) times daily. 60 tablet 5   furosemide (LASIX) 40 MG tablet Take 1 tablet (40 mg total) by mouth every other day. 45 tablet 3   gabapentin (NEURONTIN) 100 MG capsule Take 1 capsule (100 mg total) by mouth at bedtime. 90 capsule 1   glipiZIDE (GLUCOTROL) 10 MG tablet TAKE (1) TABLET BY MOUTH TWICE DAILY BEFORE A MEAL 60 tablet 0   glucose blood (ACCU-CHEK GUIDE) test strip 1 each by Other route as needed for other. Use  as instructed to check blood sugar daily. 100 each 3   LANCETS SUPER THIN 28G MISC Use to check blood sugar once daily. 100 each 3   metFORMIN (GLUCOPHAGE) 500 MG tablet TAKE 1 TABLET BY MOUTH TWICE A DAY WITH A MEAL. 180 tablet 0   mirabegron ER (MYRBETRIQ) 25 MG TB24 tablet Take 1 tablet (25 mg total) by mouth daily. 90 each 1   Multiple Vitamin (MULTIVITAMIN) tablet Take 1 tablet by mouth daily.     Nebivolol HCl 20 MG  TABS Take 1 tablet (20 mg total) by mouth daily. 90 tablet 1   potassium chloride (KLOR-CON) 10 MEQ tablet Take 1 tablet (10 mEq total) by mouth daily. 90 tablet 1   No current facility-administered medications for this visit.     PHYSICAL EXAMINATION: Performance status (ECOG): 1 - Symptomatic but completely ambulatory  There were no vitals filed for this visit. Wt Readings from Last 3 Encounters:  02/08/22 178 lb (80.7 kg)  12/31/21 179 lb 12.8 oz (81.6 kg)  08/30/21 175 lb (79.4 kg)   Physical Exam Vitals reviewed.  Constitutional:      Appearance: Normal appearance.  Cardiovascular:     Rate and Rhythm: Normal rate and regular rhythm.     Pulses: Normal pulses.     Heart sounds: Normal heart sounds.  Pulmonary:     Effort: Pulmonary effort is normal.     Breath sounds: Normal breath sounds.  Abdominal:     Palpations: Abdomen is soft. There is no hepatomegaly, splenomegaly or mass.     Tenderness: There is no abdominal tenderness.  Musculoskeletal:     Right lower leg: No edema.     Left lower leg: No edema.  Lymphadenopathy:     Cervical: No cervical adenopathy.     Right cervical: No superficial, deep or posterior cervical adenopathy.    Left cervical: No superficial, deep or posterior cervical adenopathy.     Upper Body:     Right upper body: No supraclavicular or axillary adenopathy.     Left upper body: No supraclavicular or axillary adenopathy.  Neurological:     General: No focal deficit present.     Mental Status: She is alert and oriented to person, place, and time.  Psychiatric:        Mood and Affect: Mood normal.        Behavior: Behavior normal.    Breast Exam Chaperone: Thana Ates     LABORATORY DATA:  I have reviewed the data as listed    Latest Ref Rng & Units 02/11/2022    7:56 AM 08/30/2021   11:08 AM 04/17/2021   11:22 AM  CMP  Glucose 70 - 99 mg/dL 221   148   193    BUN 8 - 23 mg/dL _0 Creatinine 0.44 - 1.00 mg/dL 0.99    1.02   1.06    Sodium 135 - 145 mmol/L 138   142   141    Potassium 3.5 - 5.1 mmol/L 3.3   3.4   3.8    Chloride 98 - 111 mmol/L 105   102   103    CO2 22 - 32 mmol/L _1 Calcium 8.9 - 10.3 mg/dL 9.2   9.6   9.1    Total Protein 6.5 - 8.1 g/dL 7.5   7.1   7.0    Total Bilirubin 0.3 -  1.2 mg/dL 0.6   0.6   0.5    Alkaline Phos 38 - 126 U/L 53   57   49    AST 15 - 41 U/L _0 ALT 0 - 44 U/L _1 No results found for: WLS937 Lab Results  Component Value Date   WBC 7.0 02/11/2022   HGB 11.5 (L) 02/11/2022   HCT 36.5 02/11/2022   MCV 79.9 (L) 02/11/2022   PLT 260 02/11/2022   NEUTROABS 3.5 02/11/2022    ASSESSMENT:  1.  Stage Ia (T1cN0M0) right breast cancer, ER positive/PR positive: -Patient was diagnosed with right breast cancer in 1997. -Right breast cancer measuring 1.1 x 1.2 cm, grade 2, status post right mastectomy with regional axillary lymph node dissection on 10/17/1995; all 8 axillary lymph nodes were negative for metastatic disease. -She had adjuvant chemotherapy consisting of Adriamycin/Cytoxan x4 cycles. -Antiestrogen therapy with tamoxifen x5 years was completed and 03/2001. -Patient was not seen at the clinic last year due to Covid. -Patient may be discharged from clinic however she would like for [place] to follow her labs and yearly mammograms. -Last mammogram done on 02/07/2020 showed B RADS category 1 negative. -We will see her back in 1 year with repeat labs mammogram and DEXA scan.   PLAN:  1.  Stage Ia (T1cN0M0) right breast cancer, ER positive/PR positive: - She denies any new onset pains. - Reviewed mammogram of the left breast dated 02/11/2022, BI-RADS Category 1. - Labs reviewed shows normal LFTs and creatinine.  CBC was grossly normal.  Hemoglobin slightly low at 11.5 with ferritin of 21.  Recommend starting iron tablet 3 times a week. - We will schedule her for 1 year follow-up with repeat mammogram and labs.   2.  Bone  health: - DEXA scan on 02/08/2021 T score -1.7, osteopenia. - Vitamin D is slightly low at 23.9.  She is not taking any vitamin D.  I have told her to start taking vitamin D daily.  We will repeat in 1 year.    Breast Cancer therapy associated bone loss: I have recommended calcium, Vitamin D and weight bearing exercises.  Orders placed this encounter:  No orders of the defined types were placed in this encounter.   The patient has a good understanding of the overall plan. She agrees with it. She will call with any problems that may develop before the next visit here.  Derek Jack, MD [place] [phone]   I, Thana Ates, am acting as a scribe for Dr. Derek Jack.  I, Derek Jack MD, have reviewed the above documentation for accuracy and completeness, and I agree with the above.

## 2022-02-19 NOTE — Patient Instructions (Signed)
[place] at [facility] Discharge Instructions  You were seen and examined today by Dr. Delton Coombes.  Dr. Delton Coombes discussed your most recent lab work and mammogram which revealed that your iron is low and your Vitamin D is low. Start taking over the counter Vitamin D and Iron. All other labs and mammogram are normal.  Follow-up as scheduled in 1 year.    Thank you for choosing [place] at [facility] to provide your oncology and hematology care.  To afford each patient quality time with our provider, please arrive at least 15 minutes before your scheduled appointment time.   If you have a lab appointment with the [facility] please come in thru the Main Entrance and check in at the main information desk.  You need to re-schedule your appointment should you arrive 10 or more minutes late.  We strive to give you quality time with our providers, and arriving late affects you and other patients whose appointments are after yours.  Also, if you no show three or more times for appointments you may be dismissed from the clinic at the providers discretion.     Again, thank you for choosing [facility].  Our hope is that these requests will decrease the amount of time that you wait before being seen by our physicians.       _____________________________________________________________  Should you have questions after your visit to [facility], please contact our office at [phone] and follow the prompts.  Our office hours are 8:00 a.m. and 4:30 p.m. Monday - Friday.  Please note that voicemails left after 4:00 p.m. may not be returned until the following business day.  We are closed weekends and major holidays.  You do have access to a nurse 24-7, just call the main number to the clinic [phone] and do not press any options, hold on the line and a nurse will answer the phone.    For prescription refill requests,  have your pharmacy contact our office and allow 72 hours.    Due to Covid, you will need to wear a mask upon entering the hospital. If you do not have a mask, a mask will be given to you at the Main Entrance upon arrival. For doctor visits, patients may have 1 support person age 9 or older with them. For treatment visits, patients can not have anyone with them due to social distancing guidelines and our immunocompromised population.

## 2022-03-20 ENCOUNTER — Other Ambulatory Visit: Payer: Self-pay | Admitting: Internal Medicine

## 2022-03-20 DIAGNOSIS — E1165 Type 2 diabetes mellitus with hyperglycemia: Secondary | ICD-10-CM

## 2022-05-01 ENCOUNTER — Other Ambulatory Visit: Payer: Self-pay | Admitting: Internal Medicine

## 2022-05-01 DIAGNOSIS — E1165 Type 2 diabetes mellitus with hyperglycemia: Secondary | ICD-10-CM

## 2022-05-02 ENCOUNTER — Ambulatory Visit: Payer: Medicare Other | Admitting: Nurse Practitioner

## 2022-05-02 ENCOUNTER — Encounter: Payer: Self-pay | Admitting: Nurse Practitioner

## 2022-05-02 ENCOUNTER — Ambulatory Visit: Payer: Medicare Other | Admitting: Internal Medicine

## 2022-05-02 VITALS — BP 148/92 | HR 93 | Ht 66.0 in | Wt 176.0 lb

## 2022-05-02 DIAGNOSIS — I10 Essential (primary) hypertension: Secondary | ICD-10-CM

## 2022-05-02 DIAGNOSIS — E782 Mixed hyperlipidemia: Secondary | ICD-10-CM

## 2022-05-02 DIAGNOSIS — M79671 Pain in right foot: Secondary | ICD-10-CM

## 2022-05-02 DIAGNOSIS — M25562 Pain in left knee: Secondary | ICD-10-CM | POA: Diagnosis not present

## 2022-05-02 DIAGNOSIS — E1165 Type 2 diabetes mellitus with hyperglycemia: Secondary | ICD-10-CM | POA: Diagnosis not present

## 2022-05-02 DIAGNOSIS — E876 Hypokalemia: Secondary | ICD-10-CM

## 2022-05-02 NOTE — Patient Instructions (Signed)
Please get TDAP and shingles vaccine at the pharmacy    It is important that you exercise regularly at least 30 minutes 5 times a week.  Think about what you will eat, plan ahead. Choose " clean, green, fresh or frozen" over canned, processed or packaged foods which are more sugary, salty and fatty. 70 to 75% of food eaten should be vegetables and fruit. Three meals at set times with snacks allowed between meals, but they must be fruit or vegetables. Aim to eat over a 12 hour period , example 7 am to 7 pm, and STOP after  your last meal of the day. Drink water,generally about 64 ounces per day, no other drink is as healthy. Fruit juice is best enjoyed in a healthy way, by EATING the fruit.  Thanks for choosing [facility], we consider it a privelige to serve you.

## 2022-05-02 NOTE — Progress Notes (Signed)
[name]     MRN: [id number]      DOB: 1945-11-17   HPI [name] with past medical history of essential hypertension, uncontrolled type 2 diabetes, hyperlipidemia, left knee pain, is here for follow up and re-evaluation of chronic medical conditions, medication management   Hypertension .  Patient reports that she has not been taking amlodipine , and nebivolol, states that she was confused about both medications, she denies chest pain, states that she sometimes feels dizzy.    Left knee pain . still having pain in her left knee she has been using Voltaren gel, taking Aleve but her pain has not improved    Patient complains of right  foot pain since and sweeling for 6-7 months, pain now geeting worse has sharp pain 7/10 black castor oil help her pain , she used to see a podiatrist but that was many years ago, she has neuropathy in both feet   Due for Tdap and shingles vaccine need for both vaccines discussed with patient patient encouraged to get both vaccines at the pharmacy       ROS Denies recent fever or chills. Denies sinus pressure, nasal congestion, ear pain or sore throat. Denies chest congestion, productive cough or wheezing. Denies chest pains, palpitations  Denies abdominal pain, nausea, vomiting,diarrhea or constipation.   Denies headaches, seizures, numbness, or tingling. Denies depression, anxiety or insomnia.   PE  BP (!) 152/84 (BP Location: Left Arm, Cuff Size: Normal)   Pulse 93   Ht _0  (1.676 m)   Wt 176 lb (79.8 kg)   SpO2 97%   BMI 28.41 kg/m   Patient alert and oriented and in no cardiopulmonary distress.  Chest: Clear to auscultation bilaterally.  CVS: S1, S2 no murmurs, no S3.Regular rate.  ABD: Soft non tender.   MS: Adequate ROM spine, shoulders, hips and knees.,  Tenderness on range of motion of right foot, tenderness on range of motion of left knee, the inner aspect of left knee is swollen and tender to touch , bilateral  lower extremities warm to touch has palpable pedal pulses. No redness noted, has diminished sensation on both feet.   Skin: Intact, scar from insect bites noted on BLE  Psych: Good eye contact, normal affect. Memory intact not anxious or depressed appearing.  CNS: CN 2-12 intact, power,  normal throughout.no focal deficits noted.   Assessment & Plan Essential hypertension BP Readings from Last 3 Encounters:  05/02/22 (!) 148/92  02/19/22 (!) 137/93  02/08/22 133/79  Chronic condition uncontrolled Currently on amlodipine 5 mg daily, nebivolol 20 mg daily but  she has not been taking both medications.  She is taking Lasix 40 mg every other day.  Patient encouraged to start taking amlodipine and nebivolol as ordered DASH diet advised patient encouraged to engage in regular daily exercises at least 150 minutes weekly as tolerated Follow-up in 4 weeks  Uncontrolled type 2 diabetes mellitus with hyperglycemia, without long-term current use of insulin (HCC) Lab Results  Component Value Date   HGBA1C 8.8 (A) 12/31/2021  Chronic uncontrolled condition on metformin 500 mg twice daily, glipizide 10 mg twice daily Checking A1c  Diabetic foot exam completed, patient referred to podiatry due to complaints of right foot pain Patient referred to ophthalmologist for her diabetic eye exam Patient counseled on low-carb diet  Hyperlipidemia Follows up with lipid Clinic On nexletol  Left knee pain Currently taking Aleve and applying diclofenac gel as needed Still having pain Patient referred  to orthopedics  Right foot pain Due for foot exam Has right foot pain Continue Aleve as needed and follow-up with podiatry  Hypokalemia Lab Results  Component Value Date   NA 138 02/11/2022   K 3.3 (L) 02/11/2022   CO2 26 02/11/2022   GLUCOSE 221 (H) 02/11/2022   BUN 12 02/11/2022   CREATININE 0.99 02/11/2022   CALCIUM 9.2 02/11/2022   EGFR 58 (L) 08/30/2021   GFRNONAA 59 (L) 02/11/2022  On  Lasix 40 mg daily, potassium 10 mEq daily Check potassium level due to hypokalemia noted on recent lab

## 2022-05-02 NOTE — Assessment & Plan Note (Signed)
Due for foot exam Has right foot pain Continue Aleve as needed and follow-up with podiatry

## 2022-05-02 NOTE — Assessment & Plan Note (Signed)
Follows up with lipid Clinic On nexletol

## 2022-05-02 NOTE — Assessment & Plan Note (Signed)
Currently taking Aleve and applying diclofenac gel as needed Still having pain Patient referred to orthopedics

## 2022-05-02 NOTE — Assessment & Plan Note (Addendum)
BP Readings from Last 3 Encounters:  05/02/22 (!) 148/92  02/19/22 (!) 137/93  02/08/22 133/79  Chronic condition uncontrolled Currently on amlodipine 5 mg daily, nebivolol 20 mg daily but  she has not been taking both medications.  She is taking Lasix 40 mg every other day.  Patient encouraged to start taking amlodipine and nebivolol as ordered DASH diet advised patient encouraged to engage in regular daily exercises at least 150 minutes weekly as tolerated Follow-up in 4 weeks

## 2022-05-02 NOTE — Assessment & Plan Note (Signed)
Lab Results  Component Value Date   NA 138 02/11/2022   K 3.3 (L) 02/11/2022   CO2 26 02/11/2022   GLUCOSE 221 (H) 02/11/2022   BUN 12 02/11/2022   CREATININE 0.99 02/11/2022   CALCIUM 9.2 02/11/2022   EGFR 58 (L) 08/30/2021   GFRNONAA 59 (L) 02/11/2022  On Lasix 40 mg daily, potassium 10 mEq daily Check potassium level due to hypokalemia noted on recent lab

## 2022-05-02 NOTE — Assessment & Plan Note (Signed)
Lab Results  Component Value Date   HGBA1C 8.8 (A) 12/31/2021  Chronic uncontrolled condition on metformin 500 mg twice daily, glipizide 10 mg twice daily Checking A1c  Diabetic foot exam completed, patient referred to podiatry due to complaints of right foot pain Patient referred to ophthalmologist for her diabetic eye exam Patient counseled on low-carb diet

## 2022-05-07 ENCOUNTER — Ambulatory Visit: Payer: Medicare Other

## 2022-05-07 ENCOUNTER — Other Ambulatory Visit: Payer: Self-pay | Admitting: Nurse Practitioner

## 2022-05-07 ENCOUNTER — Ambulatory Visit: Payer: Medicare Other | Admitting: Podiatry

## 2022-05-07 DIAGNOSIS — I739 Peripheral vascular disease, unspecified: Secondary | ICD-10-CM | POA: Diagnosis not present

## 2022-05-07 DIAGNOSIS — M7751 Other enthesopathy of right foot: Secondary | ICD-10-CM

## 2022-05-07 DIAGNOSIS — E1165 Type 2 diabetes mellitus with hyperglycemia: Secondary | ICD-10-CM

## 2022-05-07 DIAGNOSIS — M79672 Pain in left foot: Secondary | ICD-10-CM

## 2022-05-07 DIAGNOSIS — E1149 Type 2 diabetes mellitus with other diabetic neurological complication: Secondary | ICD-10-CM

## 2022-05-07 LAB — HEMOGLOBIN A1C
Est. average glucose Bld gHb Est-mCnc: 217 mg/dL
Hgb A1c MFr Bld: 9.2 % — ABNORMAL HIGH (ref 4.8–5.6)

## 2022-05-07 LAB — POTASSIUM: Potassium: 3.5 mmol/L (ref 3.5–5.2)

## 2022-05-07 MED ORDER — METFORMIN HCL 500 MG PO TABS: 1000.0000 mg | ORAL_TABLET | Freq: Two times a day (BID) | ORAL | 0 refills | Status: DC

## 2022-05-07 MED ORDER — TRIAMCINOLONE ACETONIDE 0.025 % EX OINT: 1.0000 | TOPICAL_OINTMENT | Freq: Two times a day (BID) | CUTANEOUS | 0 refills | Status: DC

## 2022-05-07 MED ORDER — SEMAGLUTIDE(0.25 OR 0.5MG/DOS) 2 MG/3ML ~~LOC~~ SOPN: 0.2500 mg | PEN_INJECTOR | SUBCUTANEOUS | 0 refills | Status: DC

## 2022-05-07 NOTE — Patient Instructions (Signed)
For instructions on how to put on your Tri-Lock Ankle Brace, please visit [email or web address]   Peroneal Tendinopathy Rehab Ask your health care provider which exercises are safe for you. Do exercises exactly as told by your health care provider and adjust them as directed. It is normal to feel mild stretching, pulling, tightness, or discomfort as you do these exercises. Stop right away if you feel sudden pain or your pain gets worse. Do not begin these exercises until told by your health care provider. Stretching and range-of-motion exercises These exercises warm up your muscles and joints. They can help improve the movement and flexibility of your ankle. They may also help to relieve pain and stiffness. Gastrocnemius and soleus stretch, standing This is an exercise in which you stand on a step and use your body weight to stretch your calf muscles. To do this exercise: Stand on the edge of a step on the ball of your left / right foot. The ball of your foot is on the walking surface, right under your toes. Keep your other foot firmly on the same step. Hold on to the wall, a railing, or a chair for balance. Slowly lift your other foot, allowing your body weight to press your left / right heel down over the edge of the step. You should feel a stretch in your left / right calf (gastrocnemius and soleus). Hold this position for __________ seconds. Return both feet to the step. Repeat this exercise with a slight bend in your left / right knee. Repeat __________ times with your left / right knee straight and __________ times with your left / right knee bent. Complete this exercise __________ times a day. Strengthening exercises These exercises build strength and endurance in your foot and ankle. Endurance is the ability to use your muscles for a long time, even after they get tired. Ankle dorsiflexion with band  Secure a rubber exercise band or tube to an object, such as a table leg, that will not  move when the band is pulled. Secure the other end of the band around your left / right foot. Sit on the floor. Face the object with your left / right leg extended. The band or tube should be slightly tense when your foot is relaxed. Slowly flex your left / right ankle and toes to bring your foot toward you (dorsiflexion). Hold this position for __________ seconds. Let the band or tube slowly pull your foot back to the starting position. Repeat __________ times. Complete this exercise __________ times a day. Ankle eversion  Sit on the floor with your legs straight out in front of you. Loop a rubber exercise band or tube around the ball of your left / right foot. The ball of your foot is on the walking surface, right under your toes. Hold the ends of the band in your hands. You can also secure the band to a stable object. The band or tube should be slightly tense when your foot is relaxed. Slowly push your foot outward, away from your other leg (eversion). Hold this position for __________ seconds. Slowly return your foot to the starting position. Repeat __________ times. Complete this exercise __________ times a day. Plantar flexion, standing This exercise is sometimes called a standing heel raise. Stand with your feet shoulder-width apart. Place your hands on a wall or table to steady yourself as needed. Try not to use it for support. Keep your weight spread evenly over the width of your feet while you slowly rise  up on your toes (plantar flexion). If told by your health care provider: Shift your weight toward your left / right leg until you feel challenged. Stand on your left / right leg only. Hold this position for __________ seconds. Repeat __________ times. Complete this exercise __________ times a day. Single leg stand  Without shoes, stand near a railing or in a doorway. You may hold on to the railing or doorframe as needed. Stand on your left / right foot. Keep your big toe down  on the floor and try to keep your arch lifted. Do not roll to the outside of your foot. If this exercise is too easy, you can try it with your eyes closed or while standing on a pillow. Hold this position for __________ seconds. Repeat __________ times. Complete this exercise __________ times a day. This information is not intended to replace advice given to you by your health care provider. Make sure you discuss any questions you have with your health care provider. Document Revised: 01/03/2022 Document Reviewed: 01/03/2022 Elsevier Patient Education  [place].   Diabetes Mellitus and [place] care is an important part of your health, especially when you have diabetes. Diabetes may cause you to have problems because of poor blood flow (circulation) to your feet and legs, which can cause your skin to: Become thinner and drier. Break more easily. Heal more slowly. Peel and crack. You may also have nerve damage (neuropathy) in your legs and feet, causing decreased feeling in them. This means that you may not notice minor injuries to your feet that could lead to more serious problems. Noticing and addressing any potential problems early is the best way to prevent future foot problems. How to care for your feet Foot hygiene  Wash your feet daily with warm water and mild soap. Do not use hot water. Then, pat your feet and the areas between your toes until they are completely dry. Do not soak your feet as this can dry your skin. Trim your toenails straight across. Do not dig under them or around the cuticle. File the edges of your nails with an emery board or nail file. Apply a moisturizing lotion or petroleum jelly to the skin on your feet and to dry, brittle toenails. Use lotion that does not contain alcohol and is unscented. Do not apply lotion between your toes. Shoes and socks Wear clean socks or stockings every day. Make sure they are not too tight. Do not wear knee-high  stockings since they may decrease blood flow to your legs. Wear shoes that fit properly and have enough cushioning. Always look in your shoes before you put them on to be sure there are no objects inside. To break in new shoes, wear them for just a few hours a day. This prevents injuries on your feet. Wounds, scrapes, corns, and calluses  Check your feet daily for blisters, cuts, bruises, sores, and redness. If you cannot see the bottom of your feet, use a mirror or ask someone for help. Do not cut corns or calluses or try to remove them with medicine. If you find a minor scrape, cut, or break in the skin on your feet, keep it and the skin around it clean and dry. You may clean these areas with mild soap and water. Do not clean the area with peroxide, alcohol, or iodine. If you have a wound, scrape, corn, or callus on your foot, look at it several times a day to make sure  it is healing and not infected. Check for: Redness, swelling, or pain. Fluid or blood. Warmth. Pus or a bad smell. General tips Do not cross your legs. This may decrease blood flow to your feet. Do not use heating pads or hot water bottles on your feet. They may burn your skin. If you have lost feeling in your feet or legs, you may not know this is happening until it is too late. Protect your feet from hot and cold by wearing shoes, such as at the beach or on hot pavement. Schedule a complete foot exam at least once a year (annually) or more often if you have foot problems. Report any cuts, sores, or bruises to your health care provider immediately. Where to find more information American Diabetes Association: www.diabetes.org Association of Diabetes Care & Education Specialists: www.diabeteseducator.org Contact a health care provider if: You have a medical condition that increases your risk of infection and you have any cuts, sores, or bruises on your feet. You have an injury that is not healing. You have redness on your  legs or feet. You feel burning or tingling in your legs or feet. You have pain or cramps in your legs and feet. Your legs or feet are numb. Your feet always feel cold. You have pain around any toenails. Get help right away if: You have a wound, scrape, corn, or callus on your foot and: You have pain, swelling, or redness that gets worse. You have fluid or blood coming from the wound, scrape, corn, or callus. Your wound, scrape, corn, or callus feels warm to the touch. You have pus or a bad smell coming from the wound, scrape, corn, or callus. You have a fever. You have a red line going up your leg. Summary Check your feet every day for blisters, cuts, bruises, sores, and redness. Apply a moisturizing lotion or petroleum jelly to the skin on your feet and to dry, brittle toenails. Wear shoes that fit properly and have enough cushioning. If you have foot problems, report any cuts, sores, or bruises to your health care provider immediately. Schedule a complete foot exam at least once a year (annually) or more often if you have foot problems. This information is not intended to replace advice given to you by your health care provider. Make sure you discuss any questions you have with your health care provider. Document Revised: 03/30/2020 Document Reviewed: 03/30/2020 Elsevier Patient Education  [place].

## 2022-05-07 NOTE — Progress Notes (Signed)
Uncontrolled DM, worse from 7.3 to 9.2. start metformin 1000 mg BID, ozempic 0.25 mg weekly. Patient should avoid fatty fried foods, eats smaller portions of meal to prevent nausea . Continue glipizide 10 mg BID,Avoid sugar sweet soda.  Please schedule a 4 week follow up to see how she is doing on ozempic . She should notify the office if she has any trouble getting the medication

## 2022-05-07 NOTE — Progress Notes (Signed)
Subjective:   Patient ID: [name], female   DOB: 76 y.o.   MRN: [id number]   HPI Chief Complaint  Patient presents with   Peripheral Neuropathy    Pt came in today for neuropathy bilateral(1 year), and right foot and ankle pain, which started a 4 mths ago, pt states it feeling like something is pulling in the ankle, pain rate is 8 out of 10, X-Rays taken today  A1c- 9.2  BG- doesn't take it     She has right ankle pain and she feels like a constant bee sting. There has been some swelling. This started a few months ago.  This pain and swelling comes and goes. She has tried oatmeal soaks  She has been having neuropathy pain for quite some time. This is 24/7 and she takes gabapentin for this. She just recently started back on this as she stopped time.   She states she did have a fall on 5/14. She injured her left knee at that time. She had x-ray at that time. No foot injury  She says there is multiple dark spots on her skin as she was standing and pale.  The lesions are much improved however still concern is on the discoloration.  Last A1c 9.2 on 05/06/2022  Review of Systems  All other systems reviewed and are negative.  Past Medical History:  Diagnosis Date   Abdominal hernia 02/26/12   unrepaired   Adenocarcinoma of breast (HCC) 1997   right / chemo + tamoxifen x 5 years    Anemia    Anxiety    Aortic atherosclerosis (HCC)    Arthritis    Blood transfusion 1980's   Breast cancer (HCC)    Cancer (HCC)    [facility]reesia 08/18/2020   Complication of anesthesia    has a hard time waking up; can't lay flat   Degenerative disc disease, lumbar    pressing on L3 and L4   Diabetes mellitus (HCC) 1989   Type 2 NIDDM; "cancer treatment gave me diabetes"   Diabetes mellitus without complication (HCC)    [facility]reesia 08/18/2020   Diverticulosis    DJD (degenerative joint disease) of lumbar spine    Dysrhythmia    "irregular"   Exertional dyspnea    Gastroparesis    GERD  (gastroesophageal reflux disease)    mann   Heart murmur    "think I outgrew it"   Hepatic steatosis    History of kidney stones    History of stomach ulcers    Hx: UTI (urinary tract infection)    Hyperlipidemia    Hypersensitivity    in tongue   Hypertension    IBS (irritable bowel syndrome)    IBS (irritable bowel syndrome)    Insomnia    Internal hemorrhoids    Kidney cysts    RT KIDNEY   Kidney stones 2009   s/p lithotripsy   Nephrolithiasis 04/13/2014   Obesity    PONV (postoperative nausea and vomiting)    Pulmonary nodule    Shortness of breath    unable to lie flat   Ventral hernia     Past Surgical History:  Procedure Laterality Date   ABDOMINAL HYSTERECTOMY  2002   APPENDECTOMY     BACK SURGERY     Can't take any medical procedure in right arm   BREAST BIOPSY     right   BREAST SURGERY N/A    Phreesia 04/22/2020   CATARACT EXTRACTION W/ INTRAOCULAR LENS  IMPLANT,   BILATERAL  2009-2010   CESAREAN SECTION  1973; 1978; 1981   CESAREAN SECTION N/A    Phreesia 04/22/2020   COLONOSCOPY WITH PROPOFOL N/A 10/29/2016   Procedure: COLONOSCOPY WITH PROPOFOL;  Surgeon: Sandi L Fields, MD;  Location: [facility];  Service: Endoscopy;  Laterality: N/A;  10:00 am   CYSTOSCOPY Left 04/13/2014   Procedure: CYSTOSCOPY FLEXIBLE;  Surgeon: Benjamin W Herrick, MD;  Location: [facility];  Service: Urology;  Laterality: Left;  with STENT   ESOPHAGOGASTRODUODENOSCOPY (EGD) WITH PROPOFOL N/A 10/29/2016   Procedure: ESOPHAGOGASTRODUODENOSCOPY (EGD) WITH PROPOFOL;  Surgeon: Sandi L Fields, MD;  Location: [facility];  Service: Endoscopy;  Laterality: N/A;   EYE SURGERY     implant and screws put in the right lower jaw  11/2008   dental surgery   kidney blockage  ?1990's   " major surgery ;put kidney on pump for awhile"   LITHOTRIPSY     "several times"   LUMBAR FUSION  08/2010   MASTECTOMY  1997   right   NEPHROLITHOTOMY Left 04/13/2014   Procedure: LEFT PERCUTANEOUS  NEPHROLITHOTOMY WITH SURGEON ACCESS;  Surgeon: Benjamin W Herrick, MD;  Location: [facility];  Service: Urology;  Laterality: Left;   OVARIAN CYST SURGERY     PARATHYROIDECTOMY  02/26/2012   Procedure: PARATHYROIDECTOMY;  Surgeon: Sui W Teoh, MD;  Location: [facility];  Service: ENT;;   SAVORY DILATION N/A 10/29/2016   Procedure: SAVORY DILATION;  Surgeon: Sandi L Fields, MD;  Location: [facility];  Service: Endoscopy;  Laterality: N/A;   SPINE SURGERY  2011   urological surgery for blocked ureter secondary to kidney stone       Current Outpatient Medications:    triamcinolone (KENALOG) 0.025 % ointment, Apply 1 Application topically 2 (two) times daily. (Patient not taking: Reported on 05/08/2022), Disp: 30 g, Rfl: 0   acetaminophen (TYLENOL) 325 MG tablet, Take 325 mg by mouth every 6 (six) hours as needed for mild pain., Disp: , Rfl:    amLODipine (NORVASC) 5 MG tablet, Take 1 tablet (5 mg total) by mouth daily., Disp: 90 tablet, Rfl: 1   Bempedoic Acid (NEXLETOL) 180 MG TABS, Take 1 tablet by mouth every other day., Disp: 30 tablet, Rfl: 11   Blood Glucose Monitoring Suppl (BLOOD GLUCOSE SYSTEM PAK) KIT, Use as directed to monitor FSBS 1x daily. Dx: E11.9, Disp: 1 each, Rfl: 1   Cholecalciferol (VITAMIN D3 PO), Take 1 tablet by mouth daily., Disp: , Rfl:    CINNAMON PO, Take 1 capsule by mouth. Takes sometimes, Disp: , Rfl:    diclofenac Sodium (VOLTAREN) 1 % GEL, Apply 4 g topically 4 (four) times daily., Disp: 4 g, Rfl: 1   famotidine (PEPCID) 20 MG tablet, Take 1 tablet (20 mg total) by mouth 2 (two) times daily., Disp: 60 tablet, Rfl: 5   furosemide (LASIX) 40 MG tablet, Take 1 tablet (40 mg total) by mouth every other day., Disp: 45 tablet, Rfl: 3   gabapentin (NEURONTIN) 100 MG capsule, Take 1 capsule (100 mg total) by mouth at bedtime., Disp: 90 capsule, Rfl: 1   glipiZIDE (GLUCOTROL) 10 MG tablet, TAKE (1) TABLET BY MOUTH TWICE DAILY BEFORE A MEAL, Disp: 60 tablet, Rfl: 0   glucose blood  (ACCU-CHEK GUIDE) test strip, 1 each by Other route as needed for other. Use as instructed to check blood sugar daily., Disp: 100 each, Rfl: 3   LANCETS SUPER THIN 28G MISC, Use to check blood sugar once daily.,   Disp: 100 each, Rfl: 3   metFORMIN (GLUCOPHAGE) 500 MG tablet, Take 2 tablets (1,000 mg total) by mouth 2 (two) times daily with a meal., Disp: 180 tablet, Rfl: 0   mirabegron ER (MYRBETRIQ) 25 MG TB24 tablet, Take 1 tablet (25 mg total) by mouth daily., Disp: 90 each, Rfl: 1   Multiple Vitamin (MULTIVITAMIN) tablet, Take 1 tablet by mouth daily., Disp: , Rfl:    Nebivolol HCl 20 MG TABS, Take 1 tablet (20 mg total) by mouth daily., Disp: 90 tablet, Rfl: 1   potassium chloride (KLOR-CON) 10 MEQ tablet, Take 1 tablet (10 mEq total) by mouth daily., Disp: 90 tablet, Rfl: 1   Semaglutide (RYBELSUS) 3 MG TABS, Take 3 mg by mouth daily., Disp: 30 tablet, Rfl: 0   [START ON 06/10/2022] Semaglutide (RYBELSUS) 7 MG TABS, Take 7 mg by mouth daily., Disp: 30 tablet, Rfl: 0  Allergies  Allergen Reactions   Demerol [Meperidine] Other (See Comments)    Lost consciousness   Bentyl [Dicyclomine Hcl] Swelling   Invokana [Canagliflozin] Hives   Metformin And Related Diarrhea   Protonix [Pantoprazole Sodium] Swelling   Statins Other (See Comments)    myalgia   Ace Inhibitors Cough          Objective:  Physical Exam  General: AAO x3, NAD  Dermatological: Skin is warm, dry and supple bilateral. There are no open sores, no preulcerative lesions, no rash or signs of infection present.  Vascular: Dorsalis Pedis artery and Posterior Tibial artery pedal pulses are 2/4 bilateral with immedate capillary fill time. There is no pain with calf compression, swelling, warmth, erythema.   Neruologic: Grossly intact via light touch bilateral.   Musculoskeletal: No gross boney pedal deformities bilateral. No pain, crepitus, or limitation noted with foot and ankle range of motion bilateral. Muscular strength  5/5 in all groups tested bilateral.  Gait: Unassisted, Nonantalgic.       Assessment:   Right ankle capsulitis, neuropathy     Plan:  -Treatment options discussed including all alternatives, risks, and complications -Etiology of symptoms were discussed -X-rays were obtained and reviewed with the patient.  3 views the right foot were obtained.  2 views of the ankle were obtained.  2 views left foot obtained as well.  Ankle joint space maintained with.  No significant arthritis of the ankle.  Bunion present.  Calcaneal spur present. -Tri-Lock ankle brace dispensed for immobilization of the ankle joint. -Discussed Voltaren as well as icing daily.  Discussed steroid injection we will proceed with next appointment if necessary. -Continue gabapentin for neuropathy -Glucose control -BPA- ABI ordered  Trula Slade DPM

## 2022-05-08 ENCOUNTER — Ambulatory Visit (INDEPENDENT_AMBULATORY_CARE_PROVIDER_SITE_OTHER): Payer: Medicare Other | Admitting: Orthopaedic Surgery

## 2022-05-08 ENCOUNTER — Encounter: Payer: Self-pay | Admitting: Orthopaedic Surgery

## 2022-05-08 VITALS — BP 150/92 | HR 90 | Ht 66.0 in | Wt 176.0 lb

## 2022-05-08 DIAGNOSIS — M25562 Pain in left knee: Secondary | ICD-10-CM | POA: Diagnosis not present

## 2022-05-08 DIAGNOSIS — G8929 Other chronic pain: Secondary | ICD-10-CM

## 2022-05-08 MED ORDER — METHYLPREDNISOLONE ACETATE 40 MG/ML IJ SUSP
40.0000 mg | Freq: Once | INTRAMUSCULAR | Status: AC
  Administered 2022-05-08: 40 mg via INTRA_ARTICULAR

## 2022-05-08 NOTE — Patient Instructions (Signed)
While we are working on your approval please go ahead and call to schedule your appointment with [facility] in at least 2 weeks.    Central Scheduling [phone]    

## 2022-05-08 NOTE — Addendum Note (Signed)
Addended by: Obie Dredge A on: 05/08/2022 02:21 PM   Modules accepted: Orders

## 2022-05-08 NOTE — Progress Notes (Signed)
Subjective:    Patient ID: [name], female    DOB: Feb 18, 1946, 76 y.o.   MRN: [id number]  HPI She [birth year] and hurt her left knee and right ankle.  She has had pain in the left knee since then. She had X-rays done that day which were negative.   She has medial pain, swelling, occasional giving way.  It is not getting any better. She has tried Tylenol, ice, heat and rubs.  She has no other trauma, no numbness.  She saw podiatrist about the right ankle and foot.   Review of Systems  Constitutional:  Positive for activity change.  Musculoskeletal:  Positive for arthralgias, gait problem and joint swelling.  All other systems reviewed and are negative. For Review of Systems, all other systems reviewed and are negative.  The following is a summary of the past history medically, past history surgically, known current medicines, social history and family history.  This information is gathered electronically by the computer from prior information and documentation.  I review this each visit and have found including this information at this point in the chart is beneficial and informative.   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   right / chemo + tamoxifen x 5 years    Anemia    Anxiety    Aortic atherosclerosis ([facility])    Arthritis    Blood transfusion 1980's   Breast cancer ([place])    Cancer ([place])    Phreesia 73/42/8768   Complication of anesthesia    has a hard time waking up; can't lay flat   Degenerative disc disease, lumbar    pressing on L3 and L4   Diabetes mellitus ([place]) 1989   Type 2 NIDDM; "cancer treatment gave me diabetes"   Diabetes mellitus without complication ([place])    Phreesia 08/18/2020   Diverticulosis    DJD (degenerative joint disease) of lumbar spine    Dysrhythmia    "irregular"   Exertional dyspnea    Gastroparesis    GERD (gastroesophageal reflux  disease)    mann   Heart murmur    "think I outgrew it"   Hepatic steatosis    History of kidney stones    History of stomach ulcers    Hx: UTI (urinary tract infection)    Hyperlipidemia    Hypersensitivity    in tongue   Hypertension    IBS (irritable bowel syndrome)    IBS (irritable bowel syndrome)    Insomnia    Internal hemorrhoids    Kidney cysts    RT KIDNEY   Kidney stones 2009   s/p lithotripsy   Nephrolithiasis 04/13/2014   Obesity    PONV (postoperative nausea and vomiting)    Pulmonary nodule    Shortness of breath    unable to lie flat   Ventral hernia     Past Surgical History:  Procedure Laterality Date   ABDOMINAL HYSTERECTOMY  2002   APPENDECTOMY     BACK SURGERY     Can't take any medical procedure in right arm   BREAST BIOPSY     right   BREAST SURGERY N/A    Phreesia 04/22/2020   CATARACT EXTRACTION W/ INTRAOCULAR LENS  IMPLANT, BILATERAL  2009-2010   CESAREAN SECTION  1973; 1978; [place] 04/22/2020   COLONOSCOPY WITH PROPOFOL N/A 10/29/2016  Procedure: COLONOSCOPY WITH PROPOFOL;  Surgeon: Danie Binder, MD;  [facility];  Service: Endoscopy;  Laterality: N/A;  10:00 am   CYSTOSCOPY Left 04/13/2014   Procedure: CYSTOSCOPY FLEXIBLE;  Surgeon: Ardis Hughs, MD;  Location: [facility];  Service: Urology;  Laterality: Left;  with STENT   ESOPHAGOGASTRODUODENOSCOPY (EGD) WITH PROPOFOL N/A 10/29/2016   Procedure: ESOPHAGOGASTRODUODENOSCOPY (EGD) WITH PROPOFOL;  Surgeon: Danie Binder, MD;  [facility];  Service: Endoscopy;  Laterality: N/A;   EYE SURGERY     implant and screws put in the right lower jaw  11/2008   dental surgery   kidney blockage  ?1990's   " major surgery ;put kidney on pump for awhile"   LITHOTRIPSY     "several times"   LUMBAR FUSION  08/2010   MASTECTOMY  1997   right   NEPHROLITHOTOMY Left 04/13/2014   Procedure: LEFT PERCUTANEOUS NEPHROLITHOTOMY WITH SURGEON ACCESS;   Surgeon: Ardis Hughs, MD;  Location: [facility];  Service: Urology;  Laterality: Left;   OVARIAN CYST SURGERY     PARATHYROIDECTOMY  02/26/2012   Procedure: PARATHYROIDECTOMY;  Surgeon: Ascencion Dike, MD;  Location: [facility];  Service: ENT;;   SAVORY DILATION N/A 10/29/2016   Procedure: SAVORY DILATION;  Surgeon: Danie Binder, MD;  [facility];  Service: Endoscopy;  Laterality: N/A;   [place]  2011   urological surgery for blocked ureter secondary to kidney stone      Current Outpatient Medications on File Prior to Visit  Medication Sig Dispense Refill   acetaminophen (TYLENOL) 325 MG tablet Take 325 mg by mouth every 6 (six) hours as needed for mild pain.     amLODipine (NORVASC) 5 MG tablet Take 1 tablet (5 mg total) by mouth daily. 90 tablet 1   Bempedoic Acid (NEXLETOL) 180 MG TABS Take 1 tablet by mouth every other day. 30 tablet 11   Blood Glucose Monitoring Suppl (BLOOD GLUCOSE SYSTEM PAK) KIT Use as directed to monitor FSBS 1x daily. Dx: E11.9 1 each 1   Cholecalciferol (VITAMIN D3 PO) Take 1 tablet by mouth daily.     CINNAMON PO Take 1 capsule by mouth. Takes sometimes     diclofenac Sodium (VOLTAREN) 1 % GEL Apply 4 g topically 4 (four) times daily. 4 g 1   famotidine (PEPCID) 20 MG tablet Take 1 tablet (20 mg total) by mouth 2 (two) times daily. 60 tablet 5   furosemide (LASIX) 40 MG tablet Take 1 tablet (40 mg total) by mouth every other day. 45 tablet 3   gabapentin (NEURONTIN) 100 MG capsule Take 1 capsule (100 mg total) by mouth at bedtime. 90 capsule 1   glipiZIDE (GLUCOTROL) 10 MG tablet TAKE (1) TABLET BY MOUTH TWICE DAILY BEFORE A MEAL 60 tablet 0   glucose blood (ACCU-CHEK GUIDE) test strip 1 each by Other route as needed for other. Use as instructed to check blood sugar daily. 100 each 3   LANCETS SUPER THIN 28G MISC Use to check blood sugar once daily. 100 each 3   metFORMIN (GLUCOPHAGE) 500 MG tablet Take 2 tablets (1,000 mg total) by mouth 2 (two) times  daily with a meal. 180 tablet 0   mirabegron ER (MYRBETRIQ) 25 MG TB24 tablet Take 1 tablet (25 mg total) by mouth daily. 90 each 1   Multiple Vitamin (MULTIVITAMIN) tablet Take 1 tablet by mouth daily.     Nebivolol HCl 20 MG TABS Take 1  tablet (20 mg total) by mouth daily. 90 tablet 1   potassium chloride (KLOR-CON) 10 MEQ tablet Take 1 tablet (10 mEq total) by mouth daily. 90 tablet 1   Semaglutide,0.25 or 0.5MG/DOS, 2 MG/3ML SOPN Inject 0.25 mg into the skin once a week. 3 mL 0   triamcinolone (KENALOG) 0.025 % ointment Apply 1 Application topically 2 (two) times daily. (Patient not taking: Reported on 05/08/2022) 30 g 0   No current facility-administered medications on file prior to visit.    Social History   Socioeconomic History   Marital status: Married    Spouse name: Not on file   Number of children: 3   Years of education: Not on file   Highest education level: Not on file  Occupational History   Occupation: retired in 2011  Tobacco Use   Smoking status: Never   Smokeless tobacco: Never  Vaping Use   Vaping Use: Never used  Substance and Sexual Activity   Alcohol use: Yes    Alcohol/week: 0.0 standard drinks of alcohol    Comment: X2 DRINKS PER YEAR   Drug use: No   Sexual activity: Not Currently    Birth control/protection: Surgical  Other Topics Concern   Not on file  Social History Narrative   Not on file   Social Determinants of Health   Financial Resource Strain: Low Risk  (04/25/2020)   Overall Financial Resource Strain (CARDIA)    Difficulty of Paying Living Expenses: Not very hard  Food Insecurity: Not on file  Transportation Needs: No Transportation Needs (08/23/2021)   PRAPARE - Hydrologist (Medical): No    Lack of Transportation (Non-Medical): No  Physical Activity: Not on file  Stress: Not on file  Social Connections: Not on file  Intimate Partner Violence: Not on file    Family History  Problem Relation Age of  Onset   Birth defects Mother    Heart attack Mother    Cancer Brother        Esophageal; smoker; deceased 52s   Colon cancer Brother    Diabetes Daughter    Leukemia Paternal Aunt        deceased 46s   Pancreatic cancer Paternal Aunt        deceased 18   Cancer Paternal Aunt        GI cancer; deceased 52s   Cancer Cousin        kidney cancer; deceased 50; pat first cousin; daughter of aunt w/ leukemia   Cancer Cousin        colon cancer @ 16; pat first cousin; son of aunt with GI cancer   Anesthesia problems Neg Hx     BP (!) 150/92   Pulse 90   Ht 5' 6" (1.676 m)   Wt 176 lb (79.8 kg)   BMI 28.41 kg/m   Body mass index is 28.41 kg/m.      Objective:   Physical Exam Vitals and nursing note reviewed. Exam conducted with a chaperone present.  Constitutional:      Appearance: She is well-developed.  HENT:     Head: Normocephalic and atraumatic.  Eyes:     Conjunctiva/sclera: Conjunctivae normal.     Pupils: Pupils are equal, round, and reactive to light.  Cardiovascular:     Rate and Rhythm: Normal rate and regular rhythm.  Pulmonary:     Effort: Pulmonary effort is normal.  Abdominal:     Palpations: Abdomen is soft.  Musculoskeletal:  Cervical back: Normal range of motion and neck supple.       Legs:  Skin:    General: Skin is warm and dry.  Neurological:     Mental Status: She is alert and oriented to person, place, and time.     Cranial Nerves: No cranial nerve deficit.     Motor: No abnormal muscle tone.     Coordination: Coordination normal.     Deep Tendon Reflexes: Reflexes are normal and symmetric. Reflexes normal.  Psychiatric:        Behavior: Behavior normal.        Thought Content: Thought content normal.        Judgment: Judgment normal.   I have independently reviewed and interpreted x-rays of this patient done at another site by another physician or qualified health professional.         Assessment & Plan:   Encounter Diagnosis   Name Primary?   Chronic pain of left knee Yes   I am concerned about medial meniscus tear left knee.  I will get MRI.  PROCEDURE NOTE:  The patient requests injections of the left knee , verbal consent was obtained.  The left knee was prepped appropriately after time out was performed.   Sterile technique was observed and injection of 1 cc of DepoMedrol 40 mg with several cc's of plain xylocaine. Anesthesia was provided by ethyl chloride and a 20-gauge needle was used to inject the knee area. The injection was tolerated well.  A band aid dressing was applied.  The patient was advised to apply ice later today and tomorrow to the injection sight as needed.  Return in two weeks.  Call if any problem.  Precautions discussed.  Electronically Signed Sanjuana Kava, MD 8/16/202310:12 AM

## 2022-05-10 ENCOUNTER — Other Ambulatory Visit: Payer: Self-pay | Admitting: Nurse Practitioner

## 2022-05-10 MED ORDER — RYBELSUS 3 MG PO TABS: 3.0000 mg | ORAL_TABLET | Freq: Every day | ORAL | 0 refills | Status: DC

## 2022-05-10 MED ORDER — RYBELSUS 7 MG PO TABS: 7.0000 mg | ORAL_TABLET | Freq: Every day | ORAL | 0 refills | Status: DC

## 2022-05-23 ENCOUNTER — Other Ambulatory Visit: Payer: Self-pay | Admitting: Nurse Practitioner

## 2022-05-23 ENCOUNTER — Ambulatory Visit (HOSPITAL_COMMUNITY)
Admission: RE | Admit: 2022-05-23 | Discharge: 2022-05-23 | Disposition: A | Discharge status: Home / Self Care | Payer: Medicare Other | Source: Ambulatory Visit | Attending: Orthopaedic Surgery | Admitting: Orthopaedic Surgery

## 2022-05-23 DIAGNOSIS — M25562 Pain in left knee: Secondary | ICD-10-CM | POA: Diagnosis present

## 2022-05-23 DIAGNOSIS — G8929 Other chronic pain: Secondary | ICD-10-CM | POA: Insufficient documentation

## 2022-05-23 DIAGNOSIS — E1165 Type 2 diabetes mellitus with hyperglycemia: Secondary | ICD-10-CM

## 2022-05-28 ENCOUNTER — Encounter: Payer: Self-pay | Admitting: Orthopaedic Surgery

## 2022-05-28 ENCOUNTER — Ambulatory Visit (HOSPITAL_COMMUNITY)
Admission: RE | Admit: 2022-05-28 | Discharge: 2022-05-28 | Disposition: A | Discharge status: Home / Self Care | Payer: Medicare Other | Source: Ambulatory Visit | Attending: Cardiology | Admitting: Cardiology

## 2022-05-28 ENCOUNTER — Ambulatory Visit (INDEPENDENT_AMBULATORY_CARE_PROVIDER_SITE_OTHER): Payer: Medicare Other | Admitting: Orthopaedic Surgery

## 2022-05-28 DIAGNOSIS — M7122 Synovial cyst of popliteal space [Baker], left knee: Secondary | ICD-10-CM

## 2022-05-28 DIAGNOSIS — M1712 Unilateral primary osteoarthritis, left knee: Secondary | ICD-10-CM | POA: Diagnosis not present

## 2022-05-28 DIAGNOSIS — M23322 Other meniscus derangements, posterior horn of medial meniscus, left knee: Secondary | ICD-10-CM

## 2022-05-28 DIAGNOSIS — I739 Peripheral vascular disease, unspecified: Secondary | ICD-10-CM | POA: Insufficient documentation

## 2022-05-28 DIAGNOSIS — G8929 Other chronic pain: Secondary | ICD-10-CM

## 2022-05-28 NOTE — Progress Notes (Signed)
My knee is a little better.  She has pain in the left knee.  She had MRI which showed: IMPRESSION: 1. Moderate patellofemoral arthritis with advanced articular cartilage thinning and cystic changes of the trochlea. Small suprapatellar joint effusion.   2. Mild medial tibiofemoral arthritis with advanced articular cartilage thinning.   3.  Baker's cyst measuring approximately 0.7 x 2.7 x 1.3 cm.   4. Horizontal tear of the body/posterior horn junction of the medial meniscus.  I have explained the findings to her.  I showed her a model.  She wants to hold off on any surgery at this point in time.  She has pain medially of the left knee, slight effusion, crepitus, positive medial McMurray, ROM 0 to 110, no distal edema, NV intact.  Encounter Diagnosis  Name Primary?   Chronic pain of left knee Yes   She will think about possible arthroscopy and return in two weeks to discuss further.  Call if any problem.  Precautions discussed.  Electronically Signed Sanjuana Kava, MD 9/5/20231:40 PM

## 2022-05-28 NOTE — Patient Instructions (Signed)
To see Dr Aline Brochure for left  knee surgical consult

## 2022-06-06 ENCOUNTER — Ambulatory Visit: Payer: Medicare Other | Admitting: Nurse Practitioner

## 2022-06-06 ENCOUNTER — Encounter: Payer: Self-pay | Admitting: Nurse Practitioner

## 2022-06-06 VITALS — BP 128/78 | HR 92 | Ht 66.0 in | Wt 171.0 lb

## 2022-06-06 DIAGNOSIS — E1165 Type 2 diabetes mellitus with hyperglycemia: Secondary | ICD-10-CM | POA: Diagnosis not present

## 2022-06-06 DIAGNOSIS — R2689 Other abnormalities of gait and mobility: Secondary | ICD-10-CM

## 2022-06-06 DIAGNOSIS — E782 Mixed hyperlipidemia: Secondary | ICD-10-CM

## 2022-06-06 DIAGNOSIS — I10 Essential (primary) hypertension: Secondary | ICD-10-CM

## 2022-06-06 DIAGNOSIS — R5382 Chronic fatigue, unspecified: Secondary | ICD-10-CM

## 2022-06-06 NOTE — Assessment & Plan Note (Addendum)
Chronic condition Her uncontrolled diabetes might be contributing to this Need to get her diabetes under control discussed. Patient referred to physical therapy

## 2022-06-06 NOTE — Progress Notes (Signed)
Established Patient Office Visit  Subjective:  Patient ID: [name], female    DOB: 03/23/1946  Age: 76 y.o. MRN: [id number]  CC:  Chief Complaint  Patient presents with   Hypertension    1 month f/u    Fatigue    Worsening since 05/06/22    HPI [name] is a 76 y.o. female with past medical history of hypertension, diabetes, hyperlipidemia, GERD presents for f/u for type 2 diabetes   Uncontrolled type 2 diabetes.  Currently on Rybelsus 3 mg daily, glipizide 10 mg twice daily, metformin 1000 mg twice daily.  Patient reports that she is tolerating Rybelsus well she denies nausea, vomiting, diarrhea.  She has not been checking her blood sugar.  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   right / chemo + tamoxifen x 5 years    Anemia    Anxiety    Aortic atherosclerosis ([place])    Arthritis    Blood transfusion 1980's   Breast cancer ([place])    Cancer ([place])    Phreesia 15/94/5859   Complication of anesthesia    has a hard time waking up; can't lay flat   Degenerative disc disease, lumbar    pressing on L3 and L4   Diabetes mellitus ([place]) 1989   Type 2 NIDDM; "cancer treatment gave me diabetes"   Diabetes mellitus without complication ([place])    Phreesia 08/18/2020   Diverticulosis    DJD (degenerative joint disease) of lumbar spine    Dysrhythmia    "irregular"   Exertional dyspnea    Gastroparesis    GERD (gastroesophageal reflux disease)    mann   Heart murmur    "think I outgrew it"   Hepatic steatosis    History of kidney stones    History of stomach ulcers    Hx: UTI  (urinary tract infection)    Hyperlipidemia    Hypersensitivity    in tongue   Hypertension    IBS (irritable bowel syndrome)    IBS (irritable bowel syndrome)    Insomnia    Internal hemorrhoids    Kidney cysts    RT KIDNEY   Kidney stones 2009   s/p lithotripsy   Nephrolithiasis 04/13/2014   Obesity    PONV (postoperative nausea and vomiting)    Pulmonary nodule    Shortness of breath    unable to lie flat   Ventral hernia     Past Surgical History:  Procedure Laterality Date   ABDOMINAL HYSTERECTOMY  2002   APPENDECTOMY     BACK SURGERY     Can't take any medical procedure in right arm   BREAST BIOPSY     right   BREAST SURGERY N/A    Phreesia 04/22/2020   CATARACT EXTRACTION W/ INTRAOCULAR LENS  IMPLANT, BILATERAL  2009-2010   CESAREAN SECTION  1973; 1978; [place] 04/22/2020   COLONOSCOPY WITH PROPOFOL N/A 10/29/2016   Procedure: COLONOSCOPY WITH PROPOFOL;  Surgeon: Danie Binder, MD;  [facility];  Service: Endoscopy;  Laterality: N/A;  10:00 am   CYSTOSCOPY Left 04/13/2014   Procedure: CYSTOSCOPY FLEXIBLE;  Surgeon: Ardis Hughs, MD;  Location: [facility];  Service: Urology;  Laterality: Left;  with STENT   ESOPHAGOGASTRODUODENOSCOPY (EGD) WITH PROPOFOL N/A 10/29/2016   Procedure: ESOPHAGOGASTRODUODENOSCOPY (EGD) WITH PROPOFOL;  Surgeon: Danie Binder, MD;  [facility];  Service: Endoscopy;  Laterality: N/A;   EYE SURGERY     implant and screws put in the right lower jaw  11/2008   dental surgery   kidney blockage  ?1990's   " major surgery ;put kidney on pump for awhile"   LITHOTRIPSY     "several times"   LUMBAR FUSION  08/2010   MASTECTOMY  1997   right   NEPHROLITHOTOMY Left 04/13/2014   Procedure: LEFT PERCUTANEOUS NEPHROLITHOTOMY WITH SURGEON ACCESS;  Surgeon: Ardis Hughs, MD;  Location: [facility];  Service: Urology;  Laterality: Left;   OVARIAN CYST SURGERY     PARATHYROIDECTOMY  02/26/2012    Procedure: PARATHYROIDECTOMY;  Surgeon: Ascencion Dike, MD;  Location: [facility];  Service: ENT;;   SAVORY DILATION N/A 10/29/2016   Procedure: SAVORY DILATION;  Surgeon: Danie Binder, MD;  [facility];  Service: Endoscopy;  Laterality: N/A;   [place]  2011   urological surgery for blocked ureter secondary to kidney stone      Family History  Problem Relation Age of Onset   Birth defects Mother    Heart attack Mother    Cancer Brother        Esophageal; smoker; deceased 43s   Colon cancer Brother    Diabetes Daughter    Leukemia Paternal Aunt        deceased 62s   Pancreatic cancer Paternal Aunt        deceased 29   Cancer Paternal Aunt        GI cancer; deceased 42s   Cancer Cousin        kidney cancer; deceased 25; pat first cousin; daughter of aunt w/ leukemia   Cancer Cousin        colon cancer @ 3; pat first cousin; son of aunt with GI cancer   Anesthesia problems Neg Hx     Social History   Socioeconomic History   Marital status: Married    Spouse name: Not on file   Number of children: 3   Years of education: Not on file   Highest education level: Not on file  Occupational History   Occupation: retired in 2011  Tobacco Use   Smoking status: Never   Smokeless tobacco: Never  Vaping Use   Vaping Use: Never used  Substance and Sexual Activity   Alcohol use: Yes    Alcohol/week: 0.0 standard drinks of alcohol    Comment: X2 DRINKS PER YEAR   Drug use: No   Sexual activity: Not Currently    Birth control/protection: Surgical  Other Topics Concern   Not on file  Social History Narrative   Not on file   Social Determinants of Health   Financial Resource Strain: Low Risk  (04/25/2020)   Overall Financial Resource Strain (CARDIA)    Difficulty of Paying Living Expenses: Not very hard  Food Insecurity: Not on file  Transportation Needs: No Transportation Needs (08/23/2021)   PRAPARE - Hydrologist (Medical): No    Lack  of Transportation (Non-Medical): No  Physical Activity: Not on file  Stress:  Not on file  Social Connections: Not on file  Intimate Partner Violence: Not on file    Outpatient Medications Prior to Visit  Medication Sig Dispense Refill   acetaminophen (TYLENOL) 325 MG tablet Take 325 mg by mouth every 6 (six) hours as needed for mild pain.     amLODipine (NORVASC) 5 MG tablet Take 1 tablet (5 mg total) by mouth daily. 90 tablet 1   Blood Glucose Monitoring Suppl (BLOOD GLUCOSE SYSTEM PAK) KIT Use as directed to monitor FSBS 1x daily. Dx: E11.9 1 each 1   CINNAMON PO Take 1 capsule by mouth. Takes sometimes     diclofenac Sodium (VOLTAREN) 1 % GEL Apply 4 g topically 4 (four) times daily. 4 g 1   famotidine (PEPCID) 20 MG tablet Take 1 tablet (20 mg total) by mouth 2 (two) times daily. 60 tablet 5   furosemide (LASIX) 40 MG tablet Take 1 tablet (40 mg total) by mouth every other day. 45 tablet 3   gabapentin (NEURONTIN) 100 MG capsule Take 1 capsule (100 mg total) by mouth at bedtime. 90 capsule 1   glipiZIDE (GLUCOTROL) 10 MG tablet TAKE (1) TABLET BY MOUTH TWICE DAILY BEFORE A MEAL 60 tablet 0   glucose blood (ACCU-CHEK GUIDE) test strip 1 each by Other route as needed for other. Use as instructed to check blood sugar daily. 100 each 3   LANCETS SUPER THIN 28G MISC Use to check blood sugar once daily. 100 each 3   metFORMIN (GLUCOPHAGE) 500 MG tablet Take 2 tablets (1,000 mg total) by mouth 2 (two) times daily with a meal. 180 tablet 0   mirabegron ER (MYRBETRIQ) 25 MG TB24 tablet Take 1 tablet (25 mg total) by mouth daily. 90 each 1   Multiple Vitamin (MULTIVITAMIN) tablet Take 1 tablet by mouth daily.     potassium chloride (KLOR-CON) 10 MEQ tablet Take 1 tablet (10 mEq total) by mouth daily. 90 tablet 1   Semaglutide (RYBELSUS) 3 MG TABS Take 3 mg by mouth daily. 30 tablet 0   [START ON 06/10/2022] Semaglutide (RYBELSUS) 7 MG TABS Take 7 mg by mouth daily. 30 tablet 0   triamcinolone  (KENALOG) 0.025 % ointment Apply 1 Application topically 2 (two) times daily. 30 g 0   Bempedoic Acid (NEXLETOL) 180 MG TABS Take 1 tablet by mouth every other day. (Patient not taking: Reported on 06/06/2022) 30 tablet 11   Cholecalciferol (VITAMIN D3 PO) Take 1 tablet by mouth daily. (Patient not taking: Reported on 06/06/2022)     Nebivolol HCl 20 MG TABS Take 1 tablet (20 mg total) by mouth daily. (Patient not taking: Reported on 06/06/2022) 90 tablet 1   No facility-administered medications prior to visit.    Allergies  Allergen Reactions   Demerol [Meperidine] Other (See Comments)    Lost consciousness   Bentyl [Dicyclomine Hcl] Swelling   Invokana [Canagliflozin] Hives   Metformin And Related Diarrhea   Protonix [Pantoprazole Sodium] Swelling   Statins Other (See Comments)    myalgia   Ace Inhibitors Cough    ROS Review of Systems  Constitutional:  Positive for fatigue. Negative for activity change, appetite change, chills and fever.  Respiratory: Negative.  Negative for apnea, choking and chest tightness.   Cardiovascular: Negative.  Negative for chest pain, palpitations and leg swelling.  Musculoskeletal:  Positive for gait problem. Negative for joint swelling, myalgias and neck pain.  Neurological:  Negative for dizziness, facial asymmetry and headaches.  Psychiatric/Behavioral:  Negative for  agitation, behavioral problems, confusion and decreased concentration.       Objective:    Physical Exam Constitutional:      General: She is not in acute distress.    Appearance: Normal appearance. She is not ill-appearing, toxic-appearing or diaphoretic.  Cardiovascular:     Rate and Rhythm: Normal rate and regular rhythm.     Pulses: Normal pulses.     Heart sounds: Normal heart sounds.     No friction rub. No gallop.  Pulmonary:     Effort: Pulmonary effort is normal. No respiratory distress.     Breath sounds: Normal breath sounds. No stridor. No wheezing, rhonchi or  rales.  Chest:     Chest wall: No tenderness.  Abdominal:     Palpations: Abdomen is soft.     Tenderness: There is no abdominal tenderness.  Musculoskeletal:        General: Tenderness present. No swelling, deformity or signs of injury.     Right lower leg: No edema.     Left lower leg: No edema.  Skin:    General: Skin is warm and dry.  Neurological:     Mental Status: She is alert and oriented to person, place, and time.     Sensory: No sensory deficit.     Motor: No weakness.     Gait: Gait normal.  Psychiatric:        Mood and Affect: Mood normal.        Behavior: Behavior normal.        Thought Content: Thought content normal.        Judgment: Judgment normal.     BP 128/78   Pulse 92   Ht '5\' 6"'  (1.676 m)   Wt 171 lb (77.6 kg)   SpO2 98%   BMI 27.60 kg/m  Wt Readings from Last 3 Encounters:  06/06/22 171 lb (77.6 kg)  05/08/22 176 lb (79.8 kg)  05/02/22 176 lb (79.8 kg)    Lab Results  Component Value Date   TSH 1.160 08/30/2021   Lab Results  Component Value Date   WBC 7.0 02/11/2022   HGB 11.5 (L) 02/11/2022   HCT 36.5 02/11/2022   MCV 79.9 (L) 02/11/2022   PLT 260 02/11/2022   Lab Results  Component Value Date   NA 138 02/11/2022   K 3.5 05/06/2022   CO2 26 02/11/2022   GLUCOSE 221 (H) 02/11/2022   BUN 12 02/11/2022   CREATININE 0.99 02/11/2022   BILITOT 0.6 02/11/2022   ALKPHOS 53 02/11/2022   AST 18 02/11/2022   ALT 17 02/11/2022   PROT 7.5 02/11/2022   ALBUMIN 3.8 02/11/2022   CALCIUM 9.2 02/11/2022   ANIONGAP 7 02/11/2022   EGFR 58 (L) 08/30/2021   GFR 55.96 (L) 02/23/2020   Lab Results  Component Value Date   CHOL 203 (H) 08/30/2021   Lab Results  Component Value Date   HDL 67 08/30/2021   Lab Results  Component Value Date   LDLCALC 116 (H) 08/30/2021   Lab Results  Component Value Date   TRIG 116 08/30/2021   Lab Results  Component Value Date   CHOLHDL 3.0 08/30/2021   Lab Results  Component Value Date   HGBA1C  9.2 (H) 05/06/2022      Assessment & Plan:   Problem List Items Addressed This Visit       Cardiovascular and Mediastinum   Essential hypertension    BP Readings from Last 3 Encounters:  06/06/22 128/78  05/08/22 (!) 150/92  05/02/22 (!) 148/92  Chronic medical condition well-controlled on amlodipine 5 mg daily, furosemide 40 mg every other day Continue current medications DASH diet advised engage in regular daily walking exercises at least 150 minutes weekly as tolerated Follow up in 3 months        Endocrine   Uncontrolled type 2 diabetes mellitus with hyperglycemia, without long-term current use of insulin (HCC) - Primary    Lab Results  Component Value Date   HGBA1C 9.2 (H) 05/06/2022  Chronic uncontrolled condition She will started on Rybelsus 3 mg daily at her last visit she is tolerating Rybelsus well Continue Rybelsus 3 mg daily after 30 days start Rybelsus 7 mg daily, advance to 40 mg daily after completion of 7 mg dose if patient continues to tolerate medication well. Continue glipizide 10 mg twice daily, metformin 1000 mg twice daily. Avoid sugar sweets soda Patient encouraged to check her blood sugar at least twice daily signs of hypoglycemia reviewed with patient. Follow-up in 3 months        Other   Hyperlipidemia    She is not taking nexletol that was ordered by cardiology  Need to get her LDL under control to prevent risk of MI, stroke discussed with patient. Patient encouraged to follow-up with cardiology Check lipid panel Avoid fried fatty foods Lab Results  Component Value Date   CHOL 203 (H) 08/30/2021   HDL 67 08/30/2021   LDLCALC 116 (H) 08/30/2021   TRIG 116 08/30/2021   CHOLHDL 3.0 08/30/2021        Relevant Orders   Lipid Profile   Balance problem    She has fallen 4 times since her last visit She denies passing out Need to prevent falls discussed with patient, patient encouraged to keep her home clear of cluster,  maintain adequate  lighting I the home , she might benefit from using a cane.  Patient referred to physical therapy      Relevant Orders   Ambulatory referral to Physical Therapy   Chronic fatigue    Chronic condition Her uncontrolled diabetes might be contributing to this Need to get her diabetes under control discussed. Patient referred to physical therapy         No orders of the defined types were placed in this encounter.   Follow-up: Return in about 3 months (around 09/05/2022) for DM/HLD/HTN.    Renee Rival, FNP

## 2022-06-06 NOTE — Assessment & Plan Note (Signed)
She is not taking nexletol that was ordered by cardiology  Need to get her LDL under control to prevent risk of MI, stroke discussed with patient. Patient encouraged to follow-up with cardiology Check lipid panel Avoid fried fatty foods Lab Results  Component Value Date   CHOL 203 (H) 08/30/2021   HDL 67 08/30/2021   LDLCALC 116 (H) 08/30/2021   TRIG 116 08/30/2021   CHOLHDL 3.0 08/30/2021

## 2022-06-06 NOTE — Assessment & Plan Note (Signed)
She has fallen 4 times since her last visit She denies passing out Need to prevent falls discussed with patient, patient encouraged to keep her home clear of cluster,  maintain adequate lighting I the home , she might benefit from using a cane.  Patient referred to physical therapy

## 2022-06-06 NOTE — Patient Instructions (Addendum)
Please call the cardiology office and schedule to be seen for your high cholesterol as discussed.  It is very important that we get your cholesterol under control to help decrease risk of you having a stroke or heart attack.   Goal for fasting blood sugar ranges from 80 to 120 and 2 hours after any meal or at bedtime should be between 130 to 170.    Please get your shingles and Tdap vaccines at the pharmacy    It is important that you exercise regularly at least 30 minutes 5 times a week.  Think about what you will eat, plan ahead. Choose " clean, green, fresh or frozen" over canned, processed or packaged foods which are more sugary, salty and fatty. 70 to 75% of food eaten should be vegetables and fruit. Three meals at set times with snacks allowed between meals, but they must be fruit or vegetables. Aim to eat over a 12 hour period , example 7 am to 7 pm, and STOP after  your last meal of the day. Drink water,generally about 64 ounces per day, no other drink is as healthy. Fruit juice is best enjoyed in a healthy way, by EATING the fruit.  Thanks for choosing [facility], we consider it a privelige to serve you.

## 2022-06-06 NOTE — Assessment & Plan Note (Signed)
Lab Results  Component Value Date   HGBA1C 9.2 (H) 05/06/2022  Chronic uncontrolled condition She will started on Rybelsus 3 mg daily at her last visit she is tolerating Rybelsus well Continue Rybelsus 3 mg daily after 30 days start Rybelsus 7 mg daily, advance to 40 mg daily after completion of 7 mg dose if patient continues to tolerate medication well. Continue glipizide 10 mg twice daily, metformin 1000 mg twice daily. Avoid sugar sweets soda Patient encouraged to check her blood sugar at least twice daily signs of hypoglycemia reviewed with patient. Follow-up in 3 months

## 2022-06-06 NOTE — Assessment & Plan Note (Addendum)
BP Readings from Last 3 Encounters:  06/06/22 128/78  05/08/22 (!) 150/92  05/02/22 (!) 148/92  Chronic medical condition well-controlled on amlodipine 5 mg daily, furosemide 40 mg every other day Continue current medications DASH diet advised engage in regular daily walking exercises at least 150 minutes weekly as tolerated Follow up in 3 months

## 2022-06-13 ENCOUNTER — Encounter: Payer: Self-pay | Admitting: Orthopaedic Surgery

## 2022-06-13 ENCOUNTER — Ambulatory Visit: Payer: Medicare Other | Admitting: Orthopaedic Surgery

## 2022-06-13 VITALS — BP 132/81 | HR 95 | Ht 66.0 in | Wt 169.0 lb

## 2022-06-13 DIAGNOSIS — M25562 Pain in left knee: Secondary | ICD-10-CM

## 2022-06-13 DIAGNOSIS — G8929 Other chronic pain: Secondary | ICD-10-CM

## 2022-06-13 NOTE — Progress Notes (Signed)
My knee is the same.  She has a tear of the medial meniscus of the left knee.  She wanted to think about any possible surgery.  She has decided not to have surgery now.  She is better and would like to hold off.  I said it is an elective procedure and I have no problem with that.  Gait is good today, no limp.  ROM is 0 to 110, some crepitus, some medial pain, slight effusion, positive medial [place].  NV intact.  No distal edema.  Encounter Diagnosis  Name Primary?   Chronic pain of left knee Yes   I will see her in six weeks.  Call if any problem.  Precautions discussed.  Electronically Signed Sanjuana Kava, MD 9/21/20239:34 AM

## 2022-06-18 ENCOUNTER — Ambulatory Visit: Payer: Medicare Other | Admitting: Podiatry

## 2022-06-18 DIAGNOSIS — L819 Disorder of pigmentation, unspecified: Secondary | ICD-10-CM

## 2022-06-18 DIAGNOSIS — M7751 Other enthesopathy of right foot: Secondary | ICD-10-CM

## 2022-06-18 DIAGNOSIS — E1149 Type 2 diabetes mellitus with other diabetic neurological complication: Secondary | ICD-10-CM

## 2022-06-18 MED ORDER — PREGABALIN 75 MG PO CAPS: 75.0000 mg | ORAL_CAPSULE | Freq: Every day | ORAL | 0 refills | Status: DC

## 2022-06-18 NOTE — Progress Notes (Signed)
Subjective: Chief Complaint  Patient presents with   Foot Pain    Patient came in today for a right foot pain, patient is doing much better, patient is having less pain, A1c- 9.2 BG- Not taking    76 year old female presents the above concerns.  She states that her ankle is doing much better she has very minimal discomfort.  She has no pain today.  She does still describe neuropathy symptoms into her toes but also having this in her fingers.  She has been on gabapentin at nighttime however she states she has not been taking this as it causes her to be very tired.  She also states has had multiple falls as I saw her last.  She is discussed with his primary care doctor.  She is currently scheduled for physical therapy.  Also with dark discoloration spots on her foot she has been using the steroid cream.  This has not been helping much.  She has no pain associated this and no swelling redness or drainage.  Objective: AAO x3, NAD DP/PT pulses palpable bilaterally, CRT less than 3 seconds Still some darkened discoloration on the spots on the top of the feet from the previous insect bites.  There is no pain associated this is no open lesions, swelling or redness or any drainage. Unable to elicit any area of tenderness to bilateral ankles on today's exam is no edema.  Flexor, extensor tendons appear to be intact.  There is no sign.  MMT 5/5. No pain with calf compression, swelling, warmth, erythema  Assessment: 76 year old female with neuropathy, discoloration resolved ankle pain.  Plan: -All treatment options discussed with the patient including all alternatives, risks, complications.  -ABI reviewed, normal  -Not able to tolerate the gabapentin.  Restart Lyrica 70 mg at nighttime.  Discussed side effects medication there is any issues.  Significantly no.  If she continues to have symptoms referral to neurology. -She has seen her PCP for the falls and has been referred to PT which she has not yet  started.  -Continue with supportive shoe gear. -She is to continue the steroid cream and discussed other topical medications she can use.  If no improvement consider retinoid to help with this. -Patient encouraged to call the office with any questions, concerns, change in symptoms.   Trula Slade DPM

## 2022-06-18 NOTE — Patient Instructions (Signed)
Pregabalin Capsules What is this medication? PREGABALIN (pre GAB a lin) treats nerve pain. It may also be used to prevent and control seizures in people with epilepsy. It works by calming overactive nerves in your body. This medicine may be used for other purposes; ask your health care provider or pharmacist if you have questions. COMMON BRAND NAME(S): Lyrica What should I tell my care team before I take this medication? They need to know if you have any of these conditions: Drug abuse or addiction Heart failure Kidney disease Lung disease Suicidal thoughts, plans or attempt An unusual or allergic reaction to pregabalin, other medications, foods, dyes, or preservatives Pregnant or trying to get pregnant Breast-feeding How should I use this medication? Take this medication by mouth with water. Take it as directed on the prescription label at the same time every day. You can take it with or without food. If it upsets your stomach, take it with food. Keep taking it unless your care team tells you to stop. A special MedGuide will be given to you by the pharmacist with each prescription and refill. Be sure to read this information carefully each time. Talk to your care team about the use of this medication in children. While it may be prescribed for children as young as 1 month for selected conditions, precautions do apply. Overdosage: If you think you have taken too much of this medicine contact a poison control center or emergency room at once. NOTE: This medicine is only for you. Do not share this medicine with others. What if I miss a dose? If you miss a dose, take it as soon as you can. If it is almost time for your next dose, take only that dose. Do not take double or extra doses. What may interact with this medication? Alcohol Antihistamines for allergy, cough, and cold Certain medications for anxiety or sleep Certain medications for blood pressure, heart disease Certain medications for  depression like amitriptyline, fluoxetine, sertraline Certain medications for diabetes, like pioglitazone, rosiglitazone Certain medications for seizures like phenobarbital, primidone General anesthetics like halothane, isoflurane, methoxyflurane, propofol Medications that relax muscles for surgery Narcotic medications for pain Phenothiazines like chlorpromazine, mesoridazine, prochlorperazine, thioridazine This list may not describe all possible interactions. Give your health care provider a list of all the medicines, herbs, non-prescription drugs, or dietary supplements you use. Also tell them if you smoke, drink alcohol, or use illegal drugs. Some items may interact with your medicine. What should I watch for while using this medication? Visit your care team for regular checks on your progress. Tell your care team if your symptoms do not start to get better or if they get worse. Do not suddenly stop taking this medication. You may develop a severe reaction. Your care team will tell you how much medication to take. If your care team wants you to stop the medication, the dose may be slowly lowered over time to avoid any side effects. You may get drowsy or dizzy. Do not drive, use machinery, or do anything that needs mental alertness until you know how this medication affects you. Do not stand up or sit up quickly, especially if you are an older patient. This reduces the risk of dizzy or fainting spells. Alcohol may interfere with the effect of this medication. Avoid alcoholic drinks. If you or your family notice any changes in your behavior, such as new or worsening depression, thoughts of harming yourself, anxiety, other unusual or disturbing thoughts, or memory loss, call your care  team right away. Wear a medical ID bracelet or chain if you are taking this medication for seizures. Carry a card that describes your condition. List the medications and doses you take on the card. This medication may  make it more difficult to father a child. Talk to your care team if you are concerned about your fertility. What side effects may I notice from receiving this medication? Side effects that you should report to your care team as soon as possible: Allergic reactions or angioedema--skin rash, itching, hives, swelling of the face, eyes, lips, tongue, arms, or legs, trouble swallowing or breathing Blurry vision Thoughts of suicide or self-harm, worsening mood, feelings of depression Trouble breathing Side effects that usually do not require medical attention (report to your care team if they continue or are bothersome): Dizziness Drowsiness Dry mouth Nausea Swelling of the ankles, feet, hands Vomiting Weight gain This list may not describe all possible side effects. Call your doctor for medical advice about side effects. You may report side effects to FDA at 1-800-FDA-1088. Where should I keep my medication? Keep out of the reach of children and pets. This medication can be abused. Keep it in a safe place to protect it from theft. Do not share it with anyone. It is only for you. Selling or giving away this medication is dangerous and against the law. Store at [employer] [address] (77 degrees F). Get rid of any unused medication after the expiration date. This medication may cause harm and death if it is taken by other adults, children, or pets. It is important to get rid of the medication as soon as you no longer need it, or it is expired. You can do this in two ways: Take the medication to a medication take-back program. Check with your pharmacy or law enforcement to find a location. If you cannot return the medication, check the label or package insert to see if the medication should be thrown out in the garbage or flushed down the toilet. If you are not sure, ask your care team. If it is safe to put it in the trash, take the medication out of the container. Mix the medication with cat litter, dirt, coffee  grounds, or other unwanted substance. Seal the mixture in a bag or container. Put it in the trash. NOTE: This sheet is a summary. It may not cover all possible information. If you have questions about this medicine, talk to your doctor, pharmacist, or health care provider.  2023 Elsevier/Gold Standard (2020-09-05 00:00:00)

## 2022-06-25 ENCOUNTER — Encounter: Payer: Self-pay | Admitting: Internal Medicine

## 2022-06-25 NOTE — Progress Notes (Unsigned)
Cardiology Office Note    Date:  06/26/2022   ID:  [name] March 21, 1946, MRN [id number]  PCP:  Alvira Monday, [place]  Cardiologist:  Dorris Carnes, MD  Electrophysiologist:  None   Chief Complaint: overdue f/u, SOB with exertion  History of Present Illness:   [name] is a 76 y.o. 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 She previously followed with Dr. Harrington Challenger for CP and SOB. Nuclear stress test in 2013 and 2019 were unrevealing. Last echo 03/2018 had shown severe LVH, EF 55-60%, G1DD, elevated LV filling pressure, trivial AI prompting addition of Lasix. She also saw pharmD for lipid management in 2021 given history of statin intolerance (patient was unsure which statins tried) but has not returned for clinic follow-up since that time.  She returns for overdue follow-up today. She reports a history of increasing exertional dyspnea over the last 6 months. She has also had some associated chest tightness though this can also happen at random times as well, not stereotypically anginal in nature. She also feels that when she lies on her left side, it produces a feeling like she needs to have a bowel movement. She reports h/o chronic diarrhea. She denies any signs of GIB. She denies any palpitations or edema. She's had 4 falls over the last year which she states have been related to balance issues. She saw PCP and was referred to PT. No LOC. She has been steadily losing weight but is also on oral semaglutide. She reports generally poor appetite but this is not new. On med review, she indicates she is not taking bempedoic acid or nebivolol - the latter was rx'd 12/2021 by PCP  after insurance would not cover Zebeta though she doesn't recall ever starting this. Mother died around age 50 after "her heart just stopped."   Labwork independently reviewed: 04/2022 K 3.5, A1c 9.2 01/2022 Cr 0.99, LFTs ok, Hgb 11.5, plt ok 08/2021 (PCP), LDL 116, trig 116, TSH wnl  Cardiology Studies:   Studies reviewed are outlined and summarized above. Reports included below if pertinent.   ABIs 05/2022 - ABI is in normal range, with an absence of peripheral arterial symptoms.  No follow up needed.   Nuc 2019 Nuclear stress EF: 64%. There was no ST segment deviation noted during stress. No T wave inversion was noted during stress. The study is normal. This is a low risk study.   Low risk stress nuclear study with normal perfusion and normal left ventricular regional and global systolic function.  Echo 2019 - Left ventricle: The cavity size was normal. There was severe    concentric hypertrophy. Systolic function was normal. The    estimated ejection fraction was in the range of 55% to 60%. Wall    motion was normal; there were no regional wall motion    abnormalities. There was an increased relative contribution of    atrial contraction to ventricular filling. Doppler parameters are    consistent with abnormal left ventricular relaxation (grade 1    diastolic dysfunction). Doppler parameters are consistent with    high ventricular filling pressure.  - Aortic valve: Moderately calcified annulus. Trileaflet; mildly    thickened, mildly calcified leaflets. There was trivial  regurgitation.  - Mitral valve: Calcified annulus.  - Left atrium: The atrium was mildly dilated.  - Tricuspid valve: There was trivial regurgitation.  - Pulmonic valve: There was trivial regurgitation.  - Pulmonary arteries: Systolic pressure could not be accurately    estimated.     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   right / chemo + tamoxifen x 5 years    Anemia    Anxiety    Aortic atherosclerosis ([facility])    Arthritis    Blood transfusion 1980's   Breast cancer ([place])    Cancer ([place])    Phreesia 54/65/0354   Complication of anesthesia    has a hard time waking up; can't lay flat   Degenerative disc disease, lumbar    pressing on L3 and L4   Diabetes mellitus ([place]) 1989   Type 2 NIDDM; "cancer treatment gave me diabetes"   Diabetes mellitus without complication ([place])    Phreesia 08/18/2020   Diverticulosis    DJD (degenerative joint disease) of lumbar spine    Dysrhythmia    "irregular"   Exertional dyspnea    Gastroparesis    GERD (gastroesophageal reflux disease)    mann   Heart murmur    "think I outgrew it"   Hepatic steatosis    History of kidney stones    History of stomach ulcers    Hx: UTI (urinary tract infection)    Hyperlipidemia    Hypersensitivity    in tongue   Hypertension    IBS (irritable bowel syndrome)    IBS (irritable bowel syndrome)    Insomnia    Internal hemorrhoids    Kidney cysts    RT KIDNEY   Kidney stones 2009   s/p lithotripsy   Nephrolithiasis 04/13/2014   Obesity    PONV (postoperative nausea and vomiting)    Pulmonary nodule    Shortness of breath    unable to lie flat   Ventral hernia     Past Surgical History:  Procedure Laterality Date   ABDOMINAL HYSTERECTOMY  2002   APPENDECTOMY     BACK SURGERY     Can't take any medical procedure in right arm   BREAST BIOPSY     right   BREAST SURGERY N/A    Phreesia 04/22/2020   CATARACT EXTRACTION W/ INTRAOCULAR LENS  IMPLANT, BILATERAL  2009-2010   CESAREAN SECTION  1973; 1978; [place] 04/22/2020   COLONOSCOPY WITH PROPOFOL N/A 10/29/2016   Procedure: COLONOSCOPY WITH PROPOFOL;  Surgeon: Danie Binder, MD;  [facility];  Service: Endoscopy;  Laterality: N/A;  10:00 am   CYSTOSCOPY Left 04/13/2014   Procedure: CYSTOSCOPY FLEXIBLE;  Surgeon:  Ardis Hughs, MD;  Location: [facility];  Service: Urology;  Laterality: Left;  with STENT   ESOPHAGOGASTRODUODENOSCOPY (EGD) WITH PROPOFOL N/A 10/29/2016   Procedure: ESOPHAGOGASTRODUODENOSCOPY (EGD) WITH PROPOFOL;  Surgeon: Danie Binder, MD;  [facility];  Service: Endoscopy;  Laterality: N/A;   EYE SURGERY     implant and screws put in the right lower jaw  11/2008   dental surgery   kidney blockage  ?1990's   " major surgery ;put kidney on pump for awhile"   LITHOTRIPSY     "several times"   LUMBAR FUSION  08/2010   MASTECTOMY  1997   right   NEPHROLITHOTOMY  Left 04/13/2014   Procedure: LEFT PERCUTANEOUS NEPHROLITHOTOMY WITH SURGEON ACCESS;  Surgeon: Ardis Hughs, MD;  Location: [facility];  Service: Urology;  Laterality: Left;   OVARIAN CYST SURGERY     PARATHYROIDECTOMY  02/26/2012   Procedure: PARATHYROIDECTOMY;  Surgeon: Ascencion Dike, MD;  Location: [facility];  Service: ENT;;   SAVORY DILATION N/A 10/29/2016   Procedure: SAVORY DILATION;  Surgeon: Danie Binder, MD;  [facility];  Service: Endoscopy;  Laterality: N/A;   [place]  2011   urological surgery for blocked ureter secondary to kidney stone      Current Medications: Current Meds  Medication Sig   acetaminophen (TYLENOL) 325 MG tablet Take 325 mg by mouth every 6 (six) hours as needed for mild pain.   amLODipine (NORVASC) 5 MG tablet Take 1 tablet (5 mg total) by mouth daily.   Blood Glucose Monitoring Suppl (BLOOD GLUCOSE SYSTEM PAK) KIT Use as directed to monitor FSBS 1x daily. Dx: E11.9   Cholecalciferol (VITAMIN D3 PO) Take 1 tablet by mouth daily.   CINNAMON PO Take 1 capsule by mouth. Takes sometimes   diclofenac Sodium (VOLTAREN) 1 % GEL Apply 4 g topically 4 (four) times daily.   famotidine (PEPCID) 20 MG tablet Take 1 tablet (20 mg total) by mouth 2 (two) times daily.   furosemide (LASIX) 40 MG tablet Take 1 tablet (40 mg total) by mouth every other day.   glipiZIDE (GLUCOTROL) 10 MG  tablet TAKE (1) TABLET BY MOUTH TWICE DAILY BEFORE A MEAL   glucose blood (ACCU-CHEK GUIDE) test strip 1 each by Other route as needed for other. Use as instructed to check blood sugar daily.   LANCETS SUPER THIN 28G MISC Use to check blood sugar once daily.   metFORMIN (GLUCOPHAGE) 500 MG tablet Take 2 tablets (1,000 mg total) by mouth 2 (two) times daily with a meal.   mirabegron ER (MYRBETRIQ) 25 MG TB24 tablet Take 1 tablet (25 mg total) by mouth daily.   Multiple Vitamin (MULTIVITAMIN) tablet Take 1 tablet by mouth daily.   Nebivolol HCl 20 MG TABS Take 1 tablet (20 mg total) by mouth daily.   potassium chloride (KLOR-CON) 10 MEQ tablet Take 1 tablet (10 mEq total) by mouth daily.   pregabalin (LYRICA) 75 MG capsule Take 1 capsule (75 mg total) by mouth at bedtime.   Semaglutide (RYBELSUS) 7 MG TABS Take 7 mg by mouth daily.   triamcinolone (KENALOG) 0.025 % ointment Apply 1 Application topically 2 (two) times daily.      Allergies:   Demerol [meperidine], Bentyl [dicyclomine hcl], Invokana [canagliflozin], Metformin and related, Protonix [pantoprazole sodium], Statins, and Ace inhibitors   Social History   Socioeconomic History   Marital status: Married    Spouse name: Not on file   Number of children: 3   Years of education: Not on file   Highest education level: Not on file  Occupational History   Occupation: retired in 2011  Tobacco Use   Smoking status: Never   Smokeless tobacco: Never  Vaping Use   Vaping Use: Never used  Substance and Sexual Activity   Alcohol use: Yes    Alcohol/week: 0.0 standard drinks of alcohol    Comment: X2 DRINKS PER YEAR   Drug use: No   Sexual activity: Not Currently    Birth control/protection: Surgical  Other Topics Concern   Not on file  Social History Narrative   Not on file   Social Determinants of  Health   Financial Resource Strain: Low Risk  (04/25/2020)   Overall Financial Resource Strain (CARDIA)    Difficulty of Paying  Living Expenses: Not very hard  Food Insecurity: Not on file  Transportation Needs: No Transportation Needs (08/23/2021)   PRAPARE - Hydrologist (Medical): No    Lack of Transportation (Non-Medical): No  Physical Activity: Not on file  Stress: Not on file  Social Connections: Not on file     Family History:  The patient's family history includes Birth defects in her mother; Cancer in her brother, cousin, cousin, and paternal aunt; Colon cancer in her brother; Diabetes in her daughter; Heart attack in her mother; Leukemia in her paternal aunt; Pancreatic cancer in her paternal aunt. There is no history of Anesthesia problems.  ROS:   Please see the history of present illness.  All other systems are reviewed and otherwise negative.    EKG(s)/Additional Labs   EKG:  EKG is ordered today, personally reviewed, demonstrating sinus tach 102bpm, left axis deviation, low voltage QRS, poor R wave progression. Prior TW changes improved on this tracing. Nonspecific TW changes.  Recent Labs: 08/30/2021: TSH 1.160 02/11/2022: ALT 17; BUN 12; Creatinine, Ser 0.99; Hemoglobin 11.5; Platelets 260; Sodium 138 05/06/2022: Potassium 3.5  Recent Lipid Panel    Component Value Date/Time   CHOL 203 (H) 08/30/2021 1108   TRIG 116 08/30/2021 1108   HDL 67 08/30/2021 1108   CHOLHDL 3.0 08/30/2021 1108   CHOLHDL 3.4 12/15/2019 1041   VLDL 27 07/03/2016 1203   LDLCALC 116 (H) 08/30/2021 1108   LDLCALC 110 (H) 12/15/2019 1041    PHYSICAL EXAM:    VS:  BP 126/72   Pulse (!) 102   Ht 5' 6.5" (1.689 m)   Wt 168 lb 12.8 oz (76.6 kg)   SpO2 97%   BMI 26.84 kg/m   BMI: Body mass index is 26.84 kg/m.  GEN: Well nourished, well developed female in no acute distress HEENT: normocephalic, atraumatic Neck: no JVD, carotid bruits, or masses Cardiac: reg rhythm, borderline tachycardic, holosystolic SEM heard over entire precordium 2/6, no rubs or gallops, no edema  Respiratory:   clear to auscultation bilaterally, normal work of breathing GI: soft, nontender, nondistended, + BS MS: no deformity or atrophy Skin: warm and dry, no rash Neuro:  Alert and Oriented x 3, Strength and sensation are intact, follows commands Psych: euthymic mood, full affect  Wt Readings from Last 3 Encounters:  06/26/22 168 lb 12.8 oz (76.6 kg)  06/13/22 169 lb (76.7 kg)  06/06/22 171 lb (77.6 kg)     ASSESSMENT & PLAN:   1. Shortness of breath, some chest tightness - etiology not totally clear. Her exam is notable for borderline sinus tachycardia with a murmur that does not appear to have been present on last clinic visit, though patient reports a prior history of murmur in the past. I have recommended to start with basic labs including CBC, CMET, TSH, pBNP as well as  2D echocardiogram. If echo unrevealing, would consider coronary CTA for further evaluation but if echo does decline in LV function or significant valvular dysfunction, would need to think about additional testing with cardiac catheterization. If she continues to have severe LVH on exam, she may benefit from cardiac MRI as well to evaluate for an infiltrative cardiomyopathy. We will take a stepwise approach to her care. I also wonder to what degree her sinus tach is contributing, though I am always a  little suspicious when I see this finding, that it could be reactive due to an underlying problem. She had HR in the 90s earlier this year as well. She is not hypoxic. Therefore we will start to gather the above information and then advise further on potentially restarting beta blocker. She does not recall ever starting nebivolol after it was switched by primary care in 12/2021. We have asked her to double check her med list when she gets home.  2. Probable HFpEF with LVH - await labs, echo as above. Consider cMRI for evaluation of severe LVH if still present. Otherwise no changes made today.  3. Essential HTN - BP controlled, no changes  made today.  4. Hyperlipidemia with aortic atherosclerosis - said to be statin intolerant though she does not recall which statins she was taking. She did not take bempedoic acid. She thought this was also a statin. She previously did not want to take PCSK9i. She does not wish to revisit any new therapies for this at present time. Would continue to review at follow-up.  Given her use of Aleve + prior stomach ulcer, I will hold off on use of preventative ASA at this time.  5. Frequent falls - she has seen PCP for this issue and was referred to PT. I will update basic labs as above but otherwise recommend continued f/u with primary care. If she continues to have balance problems, may benefit from updated brain imaging but will defer to PCP.  6. Pulmonary nodule - last assessed 12/2019 with recommendation for CT in 1 year. Given increasing dyspnea, we will arrange noncontrasted CT of the chest for re-evaluation.    Disposition: F/u with me in 1 month.   Medication Adjustments/Labs and Tests Ordered: Current medicines are reviewed at length with the patient today.  Concerns regarding medicines are outlined above. Medication changes, Labs and Tests ordered today are summarized above and listed in the Patient Instructions accessible in Encounters.   Signed, Charlie Pitter, PA-C  06/26/2022 1:41 PM    [place] Phone: [phone]; Fax: [phone]

## 2022-06-26 ENCOUNTER — Encounter: Payer: Self-pay | Admitting: Physician Assistant

## 2022-06-26 ENCOUNTER — Ambulatory Visit: Payer: Medicare Other | Attending: Physician Assistant | Admitting: Physician Assistant

## 2022-06-26 VITALS — BP 126/72 | HR 102 | Ht 66.5 in | Wt 168.8 lb

## 2022-06-26 DIAGNOSIS — R296 Repeated falls: Secondary | ICD-10-CM

## 2022-06-26 DIAGNOSIS — I5032 Chronic diastolic (congestive) heart failure: Secondary | ICD-10-CM | POA: Diagnosis not present

## 2022-06-26 DIAGNOSIS — R918 Other nonspecific abnormal finding of lung field: Secondary | ICD-10-CM

## 2022-06-26 DIAGNOSIS — I10 Essential (primary) hypertension: Secondary | ICD-10-CM

## 2022-06-26 DIAGNOSIS — E785 Hyperlipidemia, unspecified: Secondary | ICD-10-CM

## 2022-06-26 DIAGNOSIS — R0602 Shortness of breath: Secondary | ICD-10-CM

## 2022-06-26 DIAGNOSIS — R5383 Other fatigue: Secondary | ICD-10-CM | POA: Diagnosis not present

## 2022-06-26 DIAGNOSIS — I517 Cardiomegaly: Secondary | ICD-10-CM | POA: Diagnosis not present

## 2022-06-26 DIAGNOSIS — R911 Solitary pulmonary nodule: Secondary | ICD-10-CM

## 2022-06-26 NOTE — Patient Instructions (Addendum)
Medication Instructions:  When you get home check to see if you have Nebivolol '20mg'$ . Please call us and let us know. *If you need a refill on your cardiac medications before your next appointment, please call your pharmacy*   Lab Work: TODAY-BNP, CBC, CMET, TSH If you have labs (blood work) drawn today and your tests are completely normal, you will receive your results only by: [place] (if you have MyChart) OR A paper copy in the mail If you have any lab test that is abnormal or we need to change your treatment, we will call you to review the results.   Testing/Procedures: Your physician has requested that you have an echocardiogram. Echocardiography is a painless test that uses sound waves to create images of your heart. It provides your doctor with information about the size and shape of your heart and how well your heart's chambers and valves are working. This procedure takes approximately one hour. There are no restrictions for this procedure.    NON-CONTRASTED CT CHEST   Follow-Up: At [facility], you and your health needs are our priority.  As part of our continuing mission to provide you with exceptional heart care, we have created designated Provider Care Teams.  These Care Teams include your primary Cardiologist (physician) and Advanced Practice Providers (APPs -  Physician Assistants and Nurse Practitioners) who all work together to provide you with the care you need, when you need it.  We recommend signing up for the patient portal called "MyChart".  Sign up information is provided on this After Visit Summary.  MyChart is used to connect with patients for Virtual Visits (Telemedicine).  Patients are able to view lab/test results, encounter notes, upcoming appointments, etc.  Non-urgent messages can be sent to your provider as well.   To learn more about what you can do with MyChart, go to [email or web address].    Your next appointment:   1 month(s)  The  format for your next appointment:   In Person  Provider:   Melina Copa, PA-C        Other Instructions   Important Information About Sugar

## 2022-06-27 ENCOUNTER — Telehealth (HOSPITAL_COMMUNITY): Payer: Self-pay | Admitting: Radiology

## 2022-06-27 LAB — COMPREHENSIVE METABOLIC PANEL
ALT: 15 IU/L (ref 0–32)
AST: 18 IU/L (ref 0–40)
Albumin/Globulin Ratio: 1.5 (ref 1.2–2.2)
Albumin: 4.2 g/dL (ref 3.8–4.8)
Alkaline Phosphatase: 68 IU/L (ref 44–121)
BUN/Creatinine Ratio: 14 (ref 12–28)
BUN: 14 mg/dL (ref 8–27)
Bilirubin Total: 0.3 mg/dL (ref 0.0–1.2)
CO2: 22 mmol/L (ref 20–29)
Calcium: 9.6 mg/dL (ref 8.7–10.3)
Chloride: 102 mmol/L (ref 96–106)
Creatinine, Ser: 1.03 mg/dL — ABNORMAL HIGH (ref 0.57–1.00)
Globulin, Total: 2.8 g/dL (ref 1.5–4.5)
Glucose: 152 mg/dL — ABNORMAL HIGH (ref 70–99)
Potassium: 3.9 mmol/L (ref 3.5–5.2)
Sodium: 142 mmol/L (ref 134–144)
Total Protein: 7 g/dL (ref 6.0–8.5)
eGFR: 57 mL/min/{1.73_m2} — ABNORMAL LOW (ref >59)

## 2022-06-27 LAB — CBC
Hematocrit: 35.1 % (ref 34.0–46.6)
Hemoglobin: 11.2 g/dL (ref 11.1–15.9)
MCH: 25 pg — ABNORMAL LOW (ref 26.6–33.0)
MCHC: 31.9 g/dL (ref 31.5–35.7)
MCV: 78 fL — ABNORMAL LOW (ref 79–97)
Platelets: 293 10*3/uL (ref 150–450)
RBC: 4.48 x10E6/uL (ref 3.77–5.28)
RDW: 14.5 % (ref 11.7–15.4)
WBC: 10.6 10*3/uL (ref 3.4–10.8)

## 2022-06-27 LAB — TSH: TSH: 0.748 u[IU]/mL (ref 0.450–4.500)

## 2022-06-27 LAB — PRO B NATRIURETIC PEPTIDE: NT-Pro BNP: 292 pg/mL (ref 0–738)

## 2022-06-27 NOTE — Telephone Encounter (Signed)
Please call patient regarding medication  

## 2022-06-27 NOTE — Telephone Encounter (Signed)
Please assist. thx

## 2022-06-28 NOTE — Telephone Encounter (Signed)
Returned call to patient. She states she was calling to let us know that she does not have, and has not been taking, Nebivolol. Was told by DD during last vist to call back and clarify this. Will send to DD for review. Removed from [facility].

## 2022-06-28 NOTE — Telephone Encounter (Signed)
Noted, await echo

## 2022-06-28 NOTE — Addendum Note (Signed)
Addended by: Ma Hillock on: 06/28/2022 01:47 PM   Modules accepted: Orders

## 2022-07-01 ENCOUNTER — Ambulatory Visit (HOSPITAL_COMMUNITY): Payer: Medicare Other | Attending: Physician Assistant

## 2022-07-01 DIAGNOSIS — I5032 Chronic diastolic (congestive) heart failure: Secondary | ICD-10-CM | POA: Diagnosis not present

## 2022-07-01 DIAGNOSIS — R5383 Other fatigue: Secondary | ICD-10-CM | POA: Diagnosis present

## 2022-07-01 DIAGNOSIS — R0602 Shortness of breath: Secondary | ICD-10-CM | POA: Insufficient documentation

## 2022-07-01 DIAGNOSIS — I517 Cardiomegaly: Secondary | ICD-10-CM | POA: Insufficient documentation

## 2022-07-01 DIAGNOSIS — I10 Essential (primary) hypertension: Secondary | ICD-10-CM | POA: Diagnosis not present

## 2022-07-01 DIAGNOSIS — E785 Hyperlipidemia, unspecified: Secondary | ICD-10-CM | POA: Insufficient documentation

## 2022-07-01 DIAGNOSIS — R911 Solitary pulmonary nodule: Secondary | ICD-10-CM | POA: Diagnosis present

## 2022-07-01 LAB — ECHOCARDIOGRAM COMPLETE
Area-P 1/2: 2.87 cm2
S' Lateral: 2 cm

## 2022-07-02 ENCOUNTER — Telehealth: Payer: Self-pay

## 2022-07-02 DIAGNOSIS — R0609 Other forms of dyspnea: Secondary | ICD-10-CM

## 2022-07-02 DIAGNOSIS — R0789 Other chest pain: Secondary | ICD-10-CM

## 2022-07-02 DIAGNOSIS — I517 Cardiomegaly: Secondary | ICD-10-CM

## 2022-07-02 DIAGNOSIS — I5032 Chronic diastolic (congestive) heart failure: Secondary | ICD-10-CM

## 2022-07-02 MED ORDER — METOPROLOL TARTRATE 100 MG PO TABS: ORAL_TABLET | ORAL | 0 refills | Status: DC

## 2022-07-02 MED ORDER — METOPROLOL TARTRATE 25 MG PO TABS: 25.0000 mg | ORAL_TABLET | Freq: Two times a day (BID) | ORAL | 3 refills | Status: DC

## 2022-07-02 NOTE — Telephone Encounter (Signed)
Called pt reviewed results and providers recommendation.  Pt wrote down instructions.  Instructions for Cardiac MRI and Cardiac CT placed on my chart for pt to review.  Advised pt to call in or send in a my chart message if has further questions.

## 2022-07-02 NOTE — Telephone Encounter (Signed)
-----   Message from Charlie Pitter, [place] sent at 07/02/2022  7:50 AM EDT ----- Please let patient know that echo (done for SOB/chest tightness) overall looked good except there is severe thickening of the heart with mild stiffness of unclear cause. In accordance with our prior plan, we need to evaluate for abnormal protein buildup in the heart as well as blockages. Recs: - start metoprolol tartrate '25mg'$  BID due to baseline elevated heart rate - arrange cardiac MRI for severe LVH  - arrange coronary CTA for dyspnea on exertion and chest tightness - hold metformin 2 days before CTA, hold Lasix/potassium/amlodipine the day of the CT to allow [place] some BP room to have her take '100mg'$  of metoprolol the day of the procedure as her pre-CT dose -> since we are performing this test, we can actually cancel the CT chest without contrast scheduled for tomorrow since this will also look at those lung fields to f/u the pulmonary nodule - f/u with me 07/2022 as scheduled

## 2022-07-03 ENCOUNTER — Ambulatory Visit (HOSPITAL_COMMUNITY): Payer: Medicare Other

## 2022-07-03 ENCOUNTER — Encounter (HOSPITAL_COMMUNITY): Payer: Self-pay

## 2022-07-03 NOTE — Telephone Encounter (Signed)
Called and spoke with patient.  She wants to know if Melina Copa, PA-C is okay with cardiac MRI being done in January. She states she may be able to have the scan done sooner if scheduled at [facility], which she has no objection to she just prefers [facility].  Will forward to [facility], PA-C to review and advise.

## 2022-07-08 ENCOUNTER — Other Ambulatory Visit: Payer: Self-pay | Admitting: Nurse Practitioner

## 2022-07-08 DIAGNOSIS — E1165 Type 2 diabetes mellitus with hyperglycemia: Secondary | ICD-10-CM

## 2022-07-15 ENCOUNTER — Telehealth (HOSPITAL_COMMUNITY): Payer: Self-pay | Admitting: *Deleted

## 2022-07-15 NOTE — Telephone Encounter (Signed)
Reaching out to patient to offer assistance regarding upcoming cardiac imaging study; pt verbalizes understanding of appt date/time, parking situation and where to check in, medications ordered, and verified current allergies; name and call back number provided for further questions should they arise  Gordy Clement RN Navigator Cardiac Imaging [facility] [phone] office [phone] cell  Patient to take '100mg'$  metoprolol tartrate two hours prior to her cardiac CT scan.  She will hold her other BP medications and is aware to arrive at 7:30am.

## 2022-07-16 ENCOUNTER — Ambulatory Visit (HOSPITAL_COMMUNITY)
Admission: RE | Admit: 2022-07-16 | Discharge: 2022-07-16 | Disposition: A | Discharge status: Home / Self Care | Payer: Medicare Other | Source: Ambulatory Visit | Attending: Physician Assistant | Admitting: Physician Assistant

## 2022-07-16 DIAGNOSIS — I517 Cardiomegaly: Secondary | ICD-10-CM | POA: Insufficient documentation

## 2022-07-16 DIAGNOSIS — R0789 Other chest pain: Secondary | ICD-10-CM | POA: Insufficient documentation

## 2022-07-16 DIAGNOSIS — R0609 Other forms of dyspnea: Secondary | ICD-10-CM | POA: Insufficient documentation

## 2022-07-16 MED ORDER — METOPROLOL TARTRATE 5 MG/5ML IV SOLN: 10.0000 mg | INTRAVENOUS | Status: DC | PRN

## 2022-07-16 MED ORDER — METOPROLOL TARTRATE 5 MG/5ML IV SOLN
5.0000 mg | INTRAVENOUS | Status: DC | PRN
  Administered 2022-07-16: 5 mg via INTRAVENOUS

## 2022-07-16 MED ORDER — METOPROLOL TARTRATE 5 MG/5ML IV SOLN
INTRAVENOUS | Status: AC
  Administered 2022-07-16: 5 mg via INTRAVENOUS
  Filled 2022-07-16: qty 10

## 2022-07-16 MED ORDER — IOHEXOL 350 MG/ML SOLN
95.0000 mL | Freq: Once | INTRAVENOUS | Status: AC | PRN
  Administered 2022-07-16: 95 mL via INTRAVENOUS

## 2022-07-16 MED ORDER — NITROGLYCERIN 0.4 MG SL SUBL
SUBLINGUAL_TABLET | SUBLINGUAL | Status: AC
  Administered 2022-07-16: 0.8 mg via SUBLINGUAL
  Filled 2022-07-16: qty 2

## 2022-07-16 MED ORDER — NITROGLYCERIN 0.4 MG SL SUBL: 0.8000 mg | SUBLINGUAL_TABLET | Freq: Once | SUBLINGUAL | Status: AC

## 2022-07-23 ENCOUNTER — Telehealth (HOSPITAL_COMMUNITY): Payer: Self-pay | Admitting: *Deleted

## 2022-07-23 NOTE — Telephone Encounter (Signed)
Attempted to call patient regarding upcoming cardiac MRI appointment. Left message on voicemail with name and callback number  Hae Ahlers RN Navigator Cardiac Imaging [facility] [phone] Office [phone] Cell  

## 2022-07-24 ENCOUNTER — Other Ambulatory Visit: Payer: Self-pay | Admitting: Physician Assistant

## 2022-07-24 ENCOUNTER — Ambulatory Visit
Admission: RE | Admit: 2022-07-24 | Discharge: 2022-07-24 | Disposition: A | Discharge status: Home / Self Care | Payer: Medicare Other | Source: Ambulatory Visit | Attending: Physician Assistant | Admitting: Physician Assistant

## 2022-07-24 DIAGNOSIS — I351 Nonrheumatic aortic (valve) insufficiency: Secondary | ICD-10-CM | POA: Diagnosis not present

## 2022-07-24 DIAGNOSIS — R0609 Other forms of dyspnea: Secondary | ICD-10-CM | POA: Diagnosis present

## 2022-07-24 DIAGNOSIS — I340 Nonrheumatic mitral (valve) insufficiency: Secondary | ICD-10-CM

## 2022-07-24 DIAGNOSIS — R0789 Other chest pain: Secondary | ICD-10-CM | POA: Insufficient documentation

## 2022-07-24 DIAGNOSIS — I517 Cardiomegaly: Secondary | ICD-10-CM | POA: Insufficient documentation

## 2022-07-24 MED ORDER — GADOBUTROL 1 MMOL/ML IV SOLN
10.0000 mL | Freq: Once | INTRAVENOUS | Status: AC | PRN
  Administered 2022-07-24: 10 mL via INTRAVENOUS

## 2022-07-26 ENCOUNTER — Encounter: Payer: Self-pay | Admitting: Physician Assistant

## 2022-07-26 NOTE — Telephone Encounter (Signed)
Difficult to evaluate symptoms virtually. Needs to be seen in person. Given weekend/late hour, would suggest free-standing ER and not to drive herself. Please let pt know.

## 2022-07-26 NOTE — Progress Notes (Unsigned)
Cardiology Office Note    Date:  07/29/2022   ID:  [name], [name] 1946/04/29, MRN [id number]  PCP:  Alvira Monday, [place]  Cardiologist:  Dorris Carnes, MD  Electrophysiologist:  None   Chief Complaint: f/u SOB  History of Present Illness:   [name] is a 76 y.o. 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   She previously followed with Dr. Harrington Challenger for CP and dyspnea on exertion. Nuclear stress test in 2013 and 2019 were unrevealing. Prior echo 03/2018 had shown severe LVH, EF 55-60%, G1DD, elevated LV filling pressure, trivial AI prompting addition of Lasix. She also saw pharmD for lipid management in 2021 given history of statin intolerance (patient was unsure which statins tried) but has not returned for clinic follow-up since that time. She has ultimately declined bempedoic acid and PCSK9i. She was recently seen back in clinic 06/26/22 with increasing dyspnea over the past 6 months, superimposed on long history of this. She also reported some atypical symptoms of chest tightness, not stereotypically anginal in nature, and a feeling of having to have a bowel movement when she would lay on her left side. She had also had 4 falls over the previous year related to balance issues and PCP had referred her to PT.  On med review, she had not been taking bempedoic acid or nebivolol - the latter was rx'd 12/2021 by PCP after insurance would not cover Zebeta though she didn't recall ever taking this. Echo 07/01/22 showed EF 60-65%, severe concentric LVH, G1DD,  elevated LVEDP, normal RV. Coronary CT 07/16/22 showed subacute fx of 4th left rib, mild nonobstructive CAD, aortic atherosclerosis, possible small PFO. CMRI 07/24/22 showed normal LV size/function at 54%, mild LV wall thickness, mild MR, mild AI, mild AS, no infiltrative myocardial disease.  She is seen for follow-up today. She does report that after her MRI she had issues with left arm swelling which has since improved significantly with elevation and cold compresses. She reports they had considerable difficulty getting her IV so suspect this was IV infiltration related especially with the acute resolution. She notes that in the days around the MRI and CT, she had temporarily stopped several of her non-cardiac medicines, including metformin, and felt better than she had in months, including with her dyspnea. Upon resuming it she had the same recurrent dizziness, DOE, poor appetite and generally just not feeling good that she's been experiencing for over a year. She feels she is being overmedicated and plans to see her PCP to discuss simplifying her regimen. Regarding Lasix, she clarifies she only takes this daily as needed for swelling, very infrequently. She otherwise has not had any orthopnea.   Labwork independently reviewed: 06/26/22 TSH wnl, Hgb 11.2, plt 293, BNP 292, K 3.9 , Cr 1.03, LFTs wnl 04/2022 K 3.5, A1c 9.2 01/2022 Cr 0.99, LFTs ok, Hgb 11.5, plt ok 08/2021 (PCP following)- LDL 116, trig 116, TSH wnl  Cardiology Studies:   Studies reviewed are outlined and summarized above. Reports included below if pertinent.   cMRI 07/24/22  EXAM: CARDIAC MRI   TECHNIQUE: The  patient was scanned on a 1.5 Tesla Siemens magnet. A dedicated cardiac coil was used. Functional imaging was done using Fiesta sequences. 2,3, and 4 chamber views were done to assess for RWMA's. Modified Heiser's rule using a short axis stack was used to calculate an ejection fraction on a dedicated work Ecologist. The patient received 10 cc of Gadavist. After 10 minutes inversion recovery sequences were used to assess for infiltration and scar tissue. Velocity flow mapping performed   CONTRAST:  10 cc  of Gadavist   FINDINGS: 1. Normal left ventricular size, mild LV thickness, normal systolic function (LVEF = 54%). There are no regional wall motion abnormalities.   There is no late gadolinium enhancement in the left ventricular myocardium.   LVEDV: 116 ml   LVESV: 53 ml   SV: 61 ml   CO: 4.6L/min   Myocardial mass:   2. Normal right ventricular size, thickness and systolic function (RVEF = 45%). There are no regional wall motion abnormalities.   3.  Mildly dilated left atrial size, normal  right atrial size.   4. Normal size of the aortic root, ascending aorta and pulmonary artery. No evidence for cardiac shunt, normal Qp:Qs of 1.1   5. Mild mitral regurgitation, mild aortic regurgitation. Probably mild aortic valve stenosis noted.   6.  Normal pericardium.  No pericardial effusion.   IMPRESSION: 1.  Normal LV size and function, LVEF 54%.   2.  Mild LV wall thickness.   3.  No late gadolinium enhancement or scar noted.   4.  No evidence for infiltrative myocardial disease.   5.  Mild MR, mild AI, probably mild AS.     Electronically Signed   By: Kate Sable M.D.   On: 07/24/2022 18:23  Cor CT 06/2022  FINDINGS: Coronary calcium score: The patient's coronary artery calcium score is 195, which places the patient in the 71 percentile.   Coronary arteries: Normal coronary origins.  Right dominance.   Right Coronary Artery: Normal caliber vessel, gives rise to PDA. Predominantly noncalcified plaque in proximal RCA with 25-49% stenosis.   Left Main Coronary Artery: Normal caliber vessel. Distal LM with focal calcified plaque and 1-24% stenosis.   Ramus intermedius: No significant plaque or stenosis.   Left Anterior Descending  Coronary Artery: Normal caliber vessel. No significant plaque or stenosis. Gives rise to 3 small diagonal branches.   Left Circumflex Artery: Normal caliber vessel. Proximal mixed calcified and noncalcified plaque in proximal LCx with 1-24% stenosis. Gives rise to 2 OM branches. Mixed calcified and noncalcified plaque at proximal OM1 with 1-24% stenosis.   Aorta: Normal size, 32 mm at the mid ascending aorta (level of the PA bifurcation) measured double oblique. Scattered aortic atherosclerosis. No dissection seen in visualized portions of the aorta.   Aortic Valve: Mild calcifications. Trileaflet.   Other findings:   Normal pulmonary vein drainage into the left atrium.   Normal left atrial appendage without a thrombus.   Normal size of the pulmonary artery.   Normal appearance of the pericardium.   Moderate mitral annular calcification.   Likely small PFO.   IMPRESSION: 1.  Mild nonobstructive CAD, CADRADS = 2.   2. Coronary calcium score of 195. This was 71st percentile for age and sex matched control.   3. Normal coronary origin with right dominance.   4.  Likely small PFO.   5.  Scattered aortic atherosclerosis.   INTERPRETATION:   CAD-RADS 2: Mild non-obstructive CAD (25-49%). Consider  non-atherosclerotic causes of chest pain. Consider preventive therapy and risk factor modification.   Electronically Signed: By: Buford Dresser M.D. On: 07/16/2022 12:38  FINDINGS: Mediastinum/Nodes: No enlarged lymph nodes within the visualized mediastinum.   Lungs/Pleura: There is no pleural effusion. The visualized lungs appear clear.   Upper abdomen: No significant findings in the visualized upper abdomen.   Musculoskeletal/Chest wall: Previous right mastectomy. Subacute appearing fracture of the left 4th rib laterally (image 7/11). No evidence of osseous metastatic disease.   IMPRESSION: 1. Subacute appearing fracture of the left 4th rib  laterally. 2. No other significant extracardiac findings. 3. Previous right mastectomy.     Electronically Signed   By: Richardean Sale M.D.   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[place]) 1997   right / chemo + tamoxifen x 5 years    Anemia    Anxiety    Aortic atherosclerosis ([place])    Arthritis    Blood transfusion 1980's   Breast cancer ([place])    Cancer ([place])    Phreesia 03/55/9741   Complication of anesthesia    has a hard time waking up; can't lay flat   Degenerative disc disease, lumbar    pressing on L3 and L4   Diabetes mellitus ([place]) 1989   Type 2 NIDDM; "cancer treatment gave me diabetes"   Diabetes mellitus without complication ([place])    Phreesia 08/18/2020   Diverticulosis    DJD (degenerative joint disease) of lumbar spine    Dysrhythmia    "irregular"   Exertional dyspnea    Gastroparesis    GERD (gastroesophageal reflux disease)    mann   Heart murmur    "think I outgrew it"   Hepatic steatosis    History of kidney stones    History of stomach ulcers    Hx: UTI (urinary tract infection)    Hyperlipidemia    Hypersensitivity    in tongue   Hypertension    IBS (irritable bowel syndrome)    Insomnia    Internal hemorrhoids    Kidney cysts    RT KIDNEY   Kidney stones 2009   s/p lithotripsy   Mild CAD    Mild valvular heart disease    Nephrolithiasis 04/13/2014   Obesity    PFO (patent foramen ovale)    PONV (postoperative nausea and vomiting)    Pulmonary nodule    Shortness of breath    unable to lie flat   Ventral hernia     Past Surgical History:  Procedure Laterality Date   ABDOMINAL HYSTERECTOMY  2002   APPENDECTOMY     BACK SURGERY     Can't take any medical procedure in right arm   BREAST BIOPSY     right   BREAST SURGERY N/A    Phreesia 04/22/2020   CATARACT  EXTRACTION W/ INTRAOCULAR LENS  IMPLANT, BILATERAL  2009-2010   CESAREAN SECTION  1973; 1978; [place] 04/22/2020   COLONOSCOPY WITH PROPOFOL N/A 10/29/2016   Procedure: COLONOSCOPY WITH PROPOFOL;  Surgeon: Danie Binder, MD;  [facility];  Service: Endoscopy;  Laterality: N/A;  10:00 am   CYSTOSCOPY Left 04/13/2014   Procedure: CYSTOSCOPY FLEXIBLE;  Surgeon: Ardis Hughs, MD;  Location: [facility];  Service: Urology;  Laterality: Left;  with STENT   ESOPHAGOGASTRODUODENOSCOPY (EGD) WITH PROPOFOL N/A 10/29/2016   Procedure: ESOPHAGOGASTRODUODENOSCOPY (EGD) WITH PROPOFOL;  Surgeon: Danie Binder, MD;  [facility];  Service: Endoscopy;  Laterality: N/A;   EYE SURGERY     implant and screws put in the right lower jaw  11/2008   dental surgery   kidney blockage  ?1990's   " major surgery ;put kidney on pump for awhile"   LITHOTRIPSY     "several times"   LUMBAR FUSION  08/2010   MASTECTOMY  1997   right   NEPHROLITHOTOMY Left 04/13/2014   Procedure: LEFT PERCUTANEOUS NEPHROLITHOTOMY WITH SURGEON ACCESS;  Surgeon: Ardis Hughs, MD;  Location: [facility];  Service: Urology;  Laterality: Left;   OVARIAN CYST SURGERY     PARATHYROIDECTOMY  02/26/2012   Procedure: PARATHYROIDECTOMY;  Surgeon: Ascencion Dike, MD;  Location: [place];  Service: ENT;;   SAVORY DILATION N/A 10/29/2016   Procedure: SAVORY DILATION;  Surgeon: Danie Binder, MD;  [facility];  Service: Endoscopy;  Laterality: N/A;   [place]  2011   urological surgery for blocked ureter secondary to kidney stone      Current Medications: Current Meds  Medication Sig   acetaminophen (TYLENOL) 325 MG tablet Take 325 mg by mouth every 6 (six) hours as needed for mild pain.   amLODipine (NORVASC) 5 MG tablet Take 1 tablet (5 mg total) by mouth daily.   Blood Glucose Monitoring Suppl (BLOOD GLUCOSE SYSTEM PAK) KIT Use as directed to monitor FSBS 1x daily. Dx: E11.9    Cholecalciferol (VITAMIN D3 PO) Take 1 tablet by mouth daily.   CINNAMON PO Take 1 capsule by mouth. Takes sometimes   diclofenac Sodium (VOLTAREN) 1 % GEL Apply 4 g topically 4 (four) times daily.   famotidine (PEPCID) 20 MG tablet Take 1 tablet (20 mg total) by mouth 2 (two) times daily.   furosemide (LASIX) 40 MG tablet Take 1 tablet (40 mg total) by mouth every other day.   glipiZIDE (GLUCOTROL) 10 MG tablet TAKE (1) TABLET BY MOUTH TWICE DAILY BEFORE A MEAL   glucose blood (ACCU-CHEK GUIDE) test strip 1 each by Other route as needed for other. Use as instructed to check blood sugar daily.   LANCETS SUPER THIN 28G MISC Use to check blood sugar once daily.   metFORMIN (GLUCOPHAGE) 500 MG tablet Take 2 tablets (1,000 mg total) by mouth 2 (two) times daily with a meal.   metoprolol tartrate (LOPRESSOR) 100 MG tablet Take 2 hours prior to Cardiac CT   metoprolol tartrate (LOPRESSOR) 25 MG tablet Take 1 tablet (25 mg total) by mouth 2 (two) times daily.   mirabegron ER (MYRBETRIQ) 25 MG TB24 tablet Take 1 tablet (25 mg total) by mouth daily.   Multiple Vitamin (MULTIVITAMIN) tablet Take 1 tablet by mouth daily.   potassium chloride (KLOR-CON) 10 MEQ tablet Take 1 tablet (10 mEq total) by mouth daily.   triamcinolone (KENALOG) 0.025 % ointment Apply 1 Application topically 2 (two) times daily.      Allergies:   Demerol [meperidine], Bentyl [dicyclomine hcl], Invokana [canagliflozin], Metformin and related, Protonix [pantoprazole sodium], Statins, and Ace inhibitors   Social History   Socioeconomic History   Marital status: Married    Spouse name: Not on file   Number of children: 3   Years of education: Not on file   Highest education level: Not on file  Occupational History   Occupation:  retired in 2011  Tobacco Use   Smoking status: Never   Smokeless tobacco: Never  Vaping Use   Vaping Use: Never used  Substance and Sexual Activity   Alcohol use: Yes    Alcohol/week: 0.0 standard  drinks of alcohol    Comment: X2 DRINKS PER YEAR   Drug use: No   Sexual activity: Not Currently    Birth control/protection: Surgical  Other Topics Concern   Not on file  Social History Narrative   Not on file   Social Determinants of Health   Financial Resource Strain: Low Risk  (04/25/2020)   Overall Financial Resource Strain (CARDIA)    Difficulty of Paying Living Expenses: Not very hard  Food Insecurity: Not on file  Transportation Needs: No Transportation Needs (08/23/2021)   PRAPARE - Hydrologist (Medical): No    Lack of Transportation (Non-Medical): No  Physical Activity: Not on file  Stress: Not on file  Social Connections: Not on file     Family History:  The patient's family history includes Birth defects in her mother; Cancer in her brother, cousin, cousin, and paternal aunt; Colon cancer in her brother; Diabetes in her daughter; Heart attack in her mother; Leukemia in her paternal aunt; Pancreatic cancer in her paternal aunt. There is no history of Anesthesia problems.  ROS:   Please see the history of present illness.  All other systems are reviewed and otherwise negative.    EKG(s)/Additional Labs   EKG:  EKG is not ordered today  Recent Labs: 06/26/2022: ALT 15; BUN 14; Creatinine, Ser 1.03; Hemoglobin 11.2; NT-Pro BNP 292; Platelets 293; Potassium 3.9; Sodium 142; TSH 0.748  Recent Lipid Panel    Component Value Date/Time   CHOL 203 (H) 08/30/2021 1108   TRIG 116 08/30/2021 1108   HDL 67 08/30/2021 1108   CHOLHDL 3.0 08/30/2021 1108   CHOLHDL 3.4 12/15/2019 1041   VLDL 27 07/03/2016 1203   LDLCALC 116 (H) 08/30/2021 1108   LDLCALC 110 (H) 12/15/2019 1041    PHYSICAL EXAM:    VS:  BP 126/64   Pulse 96   Ht 5' 6.5" (1.689 m)   Wt 171 lb (77.6 kg)   SpO2 98%   BMI 27.19 kg/m   BMI: Body mass index is 27.19 kg/m.  GEN: Well nourished, well developed female in no acute distress HEENT: normocephalic, atraumatic Neck:  no JVD, carotid bruits, or masses Cardiac: RRR; +soft SEM (pt reports lifelong), no rubs or gallops, no edema  Respiratory:  clear to auscultation bilaterally, normal work of breathing GI: soft, nontender, nondistended, + BS MS: no deformity or atrophy Skin: warm and dry, no rash, minimal L arm swelling without warmth or acute tenderness - patient reports marked improvement over the weekend with elevation and cold compresses Neuro:  Alert and Oriented x 3, Strength and sensation are intact, follows commands Psych: euthymic mood, full affect  Wt Readings from Last 3 Encounters:  07/29/22 171 lb (77.6 kg)  06/26/22 168 lb 12.8 oz (76.6 kg)  06/13/22 169 lb (76.7 kg)     ASSESSMENT & PLAN:   1. Exertional dyspnea - long history of this in notes. She was previously trialed on Lasix for elevated LV filling pressures but did not feel this made a substantial difference so now only takes this as needed for ankle edema. Recent BNP was normal. I added back metoprolol for borderline sinus tach. She has no tobacco/occupational exposure hx. Cor CT showed no obstructive  disease or pulmonary findings. Interestingly 2D echoes have shown severe LVH, yet follow-up cardiac MRI showed only mild LV thickness. I have asked Dr. Harrington Challenger to take a look and let me know what she thinks. Interestingly she reports she took a drug holiday from several of her non-cardiac medicines and feels that specifically off the metformin she felt significantly better for several days in all aspects from appetite to dizziness and dyspnea. I have asked her to follow up with her PCP to discuss the concerns she has about her diabetic regimen. We discussed not abruptly stopping medicines in the future without speaking to her PCP. We will otherwise continue present regimen and follow-up in 2-3 months.  2. Chronic HFpEF with LVH - appears euvolemic. Though recent filling pressure elevated on echo, BNP normal and no pleural fluid/congestion seen on  imaging. Will d/w Dr. Harrington Challenger as above. The question of LVH is perplexing as she otherwise does not have a specific reason to have this, since BP has been generally controlled. Had what appeared to be IV infiltration after her MRI which is greatly improving per patient report. If swelling worsens, consider ultrasound, but she is pleased with the improvement.  3. Essential HTN - controlled. Historically very sensitive to medicine but tolerating amlodipine OK. Takes Lasix only PRN now, but will continue K supplement daily given last value 3.9.  4. Hyperlipidemia with aortic atherosclerosis, mild CAD - medical therapy recommended, though she has declined adjunctive therapy for her cholesterol. Hold off ASA given frequent falls and lack of obstructive coronary disease.    Disposition: F/u with me or Dr. Harrington Challenger in 2-3 months.   Medication Adjustments/Labs and Tests Ordered: Current medicines are reviewed at length with the patient today.  Concerns regarding medicines are outlined above. Medication changes, Labs and Tests ordered today are summarized above and listed in the Patient Instructions accessible in Encounters.   Signed, Charlie Pitter, PA-C  07/29/2022 11:35 AM    [place] Phone: [phone]; Fax: [phone]

## 2022-07-29 ENCOUNTER — Encounter: Payer: Self-pay | Admitting: Physician Assistant

## 2022-07-29 ENCOUNTER — Ambulatory Visit: Payer: Medicare Other | Attending: Physician Assistant | Admitting: Physician Assistant

## 2022-07-29 VITALS — BP 126/64 | HR 96 | Ht 66.5 in | Wt 171.0 lb

## 2022-07-29 DIAGNOSIS — R0609 Other forms of dyspnea: Secondary | ICD-10-CM | POA: Diagnosis not present

## 2022-07-29 DIAGNOSIS — I517 Cardiomegaly: Secondary | ICD-10-CM

## 2022-07-29 DIAGNOSIS — I5032 Chronic diastolic (congestive) heart failure: Secondary | ICD-10-CM

## 2022-07-29 DIAGNOSIS — I2510 Atherosclerotic heart disease of native coronary artery without angina pectoris: Secondary | ICD-10-CM

## 2022-07-29 DIAGNOSIS — I10 Essential (primary) hypertension: Secondary | ICD-10-CM | POA: Diagnosis not present

## 2022-07-29 DIAGNOSIS — E785 Hyperlipidemia, unspecified: Secondary | ICD-10-CM

## 2022-07-29 DIAGNOSIS — I700 Atherosclerosis of aorta: Secondary | ICD-10-CM

## 2022-07-29 MED ORDER — FUROSEMIDE 40 MG PO TABS: 40.0000 mg | ORAL_TABLET | Freq: Every day | ORAL | 0 refills | Status: DC | PRN

## 2022-07-29 NOTE — Patient Instructions (Addendum)
Medication Instructions:    Your physician recommends that you continue on your current medications as directed. Please refer to the Current Medication list given to you today.   *If you need a refill on your cardiac medications before your next appointment, please call your pharmacy*   Lab Work: [place]    If you have labs (blood work) drawn today and your tests are completely normal, you will receive your results only by: [place] (if you have MyChart) OR A paper copy in the mail If you have any lab test that is abnormal or we need to change your treatment, we will call you to review the results.   Testing/Procedures: NONE ORDERED  TODAY     Follow-Up: At [facility], you and your health needs are our priority.  As part of our continuing mission to provide you with exceptional heart care, we have created designated Provider Care Teams.  These Care Teams include your primary Cardiologist (physician) and Advanced Practice Providers (APPs -  Physician Assistants and Nurse Practitioners) who all work together to provide you with the care you need, when you need it.  We recommend signing up for the patient portal called "MyChart".  Sign up information is provided on this After Visit Summary.  MyChart is used to connect with patients for Virtual Visits (Telemedicine).  Patients are able to view lab/test results, encounter notes, upcoming appointments, etc.  Non-urgent messages can be sent to your provider as well.   To learn more about what you can do with MyChart, go to [email or web address].    Your next appointment:   2-3 month(s)  The format for your next appointment:   In Person  Provider:   Melina Copa, PA-C  or  Dorris Carnes, MD   Other Instructions   Important Information About Sugar

## 2022-07-31 ENCOUNTER — Telehealth: Payer: Self-pay | Admitting: Physician Assistant

## 2022-07-31 NOTE — Telephone Encounter (Signed)
The patient has been notified of the result and verbalized understanding.  All questions (if any) were answered. Bernestine Amass, RN 07/31/2022 10:33 AM

## 2022-07-31 NOTE — Telephone Encounter (Signed)
Please let pt know that Dr. Harrington Challenger reviewed her echo and MRI with our echo tech and another physician, Dr. Steele Berg. Interestingly some of her echo views show heart muscle to be thick but MRI looks good and NOT thick - this is the most specific test of the two, so no evidence for hypertrophic cardiomyopathy. Since majority of her symptoms resolved when she adjusted her diabetic medicines, I would recommend she see primary care back as discussed, then if no improvement in symptoms or any worsening, come back to see [place] sooner. Otherwise keep f/u in January as scheduled.

## 2022-08-06 ENCOUNTER — Ambulatory Visit: Payer: Medicare Other | Admitting: Orthopaedic Surgery

## 2022-08-19 ENCOUNTER — Other Ambulatory Visit: Payer: Self-pay | Admitting: Family Medicine

## 2022-08-19 DIAGNOSIS — E1165 Type 2 diabetes mellitus with hyperglycemia: Secondary | ICD-10-CM

## 2022-09-02 NOTE — Progress Notes (Unsigned)
Subjective:   [name] is a 76 y.o. female who presents for Medicare Annual (Subsequent) preventive examination.  I connected with  [name] on 09/02/22 by a audio enabled telemedicine application and verified that I am speaking with the correct person using two identifiers.  Patient Location: Home  Provider Location: Office/Clinic  I discussed the limitations of evaluation and management by telemedicine. The patient expressed understanding and agreed to proceed.   Review of Systems     [name] , Thank you for taking time to come for your Medicare Wellness Visit. I appreciate your ongoing commitment to your health goals. Please review the following plan we discussed and let me know if I can assist you in the future.   These are the goals we discussed:  Goals      HEMOGLOBIN A1C < 7     Pharmacy Care Plan:     CARE PLAN ENTRY (see longitudinal plan of care for additional care plan information)  Current Barriers:  Chronic Disease Management support, education, and care coordination needs related to Hypertension, Hyperlipidemia, and Diabetes   Hypertension BP Readings from Last 3 Encounters:  04/17/20 (!) 132/76  04/12/20 130/80  02/28/20 128/74  Pharmacist Clinical Goal(s): Over the next 90 days, patient will work with PharmD and providers to maintain BP goal <130/80 Current regimen:  Bystolic 43XV daily Interventions: Reviewed most recent office BP Discussed diet/exercise Patient self care activities - Over the next 30 days, patient will: Check BP periodically, document, and provide at future appointments Ensure daily salt intake < 2300 mg/day Contact providers with consistent BP > 130/80  Hyperlipidemia Lab Results  Component Value Date/Time   LDLCALC 110 (H) 12/15/2019 10:41 AM  Pharmacist Clinical Goal(s): Over the next 90 days, patient will work with PharmD and providers to achieve LDL goal < 70 Current regimen:  No  medications Interventions: Reviewed most recent lipid panel Discussed history of intolerance to statin Patient self care activities - Over the next 30 days, patient will: Focus on diet low in saturated fats and sugars to bring down LDL cholesterol  Diabetes Lab Results  Component Value Date/Time   HGBA1C 7.8 (A) 04/12/2020 11:32 AM   HGBA1C 8.5 (H) 12/15/2019 10:41 AM   HGBA1C 8.2 (H) 04/09/2019 12:13 PM  Pharmacist Clinical Goal(s): Over the next 90 days, patient will work with PharmD and providers to achieve A1c goal <7% Current regimen:  Metformin 535m twice daily Glimepiride 482mdaily Interventions: Reviewed home monitoring and A1c Counseled on importance of eating with doses of glimepiride to avoid low blood sugars Discussed dietary modifications to promote euglycemia. Patient self care activities - Over the next 30 days, patient will: Check blood sugar once daily, document, and provide at future appointments Contact provider with any episodes of hypoglycemia Work on diet and exercise to meet goal A1c of < 7%. Please see past updates related to this goal by clicking on the "Past Updates" button in the selected goal          This is a list of the screening recommended for you and due dates:  Health Maintenance  Topic Date Due   Zoster (Shingles) Vaccine (1 of 2) Never done   Eye exam for diabetics  09/09/2021   DTaP/Tdap/Td vaccine (2 - Td or Tdap) 12/10/2021   Flu Shot  04/23/2022   COVID-19 Vaccine (4 - 2023-24 season) 05/24/2022   Hemoglobin A1C  11/06/2022   Yearly kidney health urinalysis for diabetes  01/01/2023  Complete foot exam   05/03/2023   Yearly kidney function blood test for diabetes  06/27/2023   Medicare Annual Wellness Visit  09/03/2023   Colon Cancer Screening  10/29/2026   Pneumonia Vaccine  Completed   DEXA scan (bone density measurement)  Completed   Hepatitis C Screening: USPSTF Recommendation to screen - Ages 5-79 yo.  Completed   HPV  Vaccine  Aged Out          Objective:    There were no vitals filed for this visit. There is no height or weight on file to calculate BMI.     02/19/2022    2:03 PM 08/23/2021    9:31 AM 02/15/2021   12:05 PM 01/17/2021   10:11 AM 12/06/2020   11:12 AM 08/21/2020    9:35 AM 02/10/2020   12:53 PM  Advanced Directives  Does Patient Have a Medical Advance Directive? Yes No Yes Yes Yes No   Type of Paramedic of [place];Living will  Living will;Healthcare Power of Attorney Living will;Healthcare Power of Attorney Living will;Healthcare Power of Attorney    Does patient want to make changes to medical advance directive?   No - Patient declined No - Patient declined     Copy of [place] in Chart? No - copy requested        Would patient like information on creating a medical advance directive? No - Patient declined Yes (ED - Information included in AVS)    No - Patient declined      Information is confidential and restricted. Go to Review Flowsheets to unlock data.     Current Medications (verified) Outpatient Encounter Medications as of 09/02/2022  Medication Sig   acetaminophen (TYLENOL) 325 MG tablet Take 325 mg by mouth every 6 (six) hours as needed for mild pain.   amLODipine (NORVASC) 5 MG tablet Take 1 tablet (5 mg total) by mouth daily.   Blood Glucose Monitoring Suppl (BLOOD GLUCOSE SYSTEM PAK) KIT Use as directed to monitor FSBS 1x daily. Dx: E11.9   Cholecalciferol (VITAMIN D3 PO) Take 1 tablet by mouth daily.   CINNAMON PO Take 1 capsule by mouth. Takes sometimes   diclofenac Sodium (VOLTAREN) 1 % GEL Apply 4 g topically 4 (four) times daily.   famotidine (PEPCID) 20 MG tablet Take 1 tablet (20 mg total) by mouth 2 (two) times daily.   furosemide (LASIX) 40 MG tablet Take 1 tablet (40 mg total) by mouth daily as needed (FOR SWELLING).   glipiZIDE (GLUCOTROL) 10 MG tablet TAKE (1) TABLET BY MOUTH TWICE DAILY BEFORE A MEAL   glucose  blood (ACCU-CHEK GUIDE) test strip 1 each by Other route as needed for other. Use as instructed to check blood sugar daily.   LANCETS SUPER THIN 28G MISC Use to check blood sugar once daily.   metFORMIN (GLUCOPHAGE) 500 MG tablet Take 2 tablets (1,000 mg total) by mouth 2 (two) times daily with a meal.   metoprolol tartrate (LOPRESSOR) 25 MG tablet Take 1 tablet (25 mg total) by mouth 2 (two) times daily.   mirabegron ER (MYRBETRIQ) 25 MG TB24 tablet Take 1 tablet (25 mg total) by mouth daily.   Multiple Vitamin (MULTIVITAMIN) tablet Take 1 tablet by mouth daily.   potassium chloride (KLOR-CON) 10 MEQ tablet Take 1 tablet (10 mEq total) by mouth daily.   pregabalin (LYRICA) 75 MG capsule Take 1 capsule (75 mg total) by mouth at bedtime. (Patient not taking: Reported on 07/29/2022)  Semaglutide (RYBELSUS) 7 MG TABS Take 7 mg by mouth daily. (Patient not taking: Reported on 07/29/2022)   triamcinolone (KENALOG) 0.025 % ointment Apply 1 Application topically 2 (two) times daily.   No facility-administered encounter medications on file as of 09/02/2022.    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   right / chemo + tamoxifen x 5 years    Anemia    Anxiety    Aortic atherosclerosis ([facility])    Arthritis    Blood transfusion 1980's   Breast cancer ([place])    Cancer ([place])    Phreesia 12/87/8676   Complication of anesthesia    has a hard time waking up; can't lay flat   Degenerative disc disease, lumbar    pressing on L3 and L4   Diabetes mellitus ([place]) 1989   Type 2 NIDDM; "cancer treatment gave me diabetes"   Diabetes mellitus without complication ([place])    Phreesia  08/18/2020   Diverticulosis    DJD (degenerative joint disease) of lumbar spine    Dysrhythmia    "irregular"   Exertional dyspnea    Gastroparesis    GERD (gastroesophageal reflux disease)    mann   Heart murmur    "think I outgrew it"   Hepatic steatosis    History of kidney stones    History of stomach ulcers    Hx: UTI (urinary tract infection)    Hyperlipidemia    Hypersensitivity    in tongue   Hypertension    IBS (irritable bowel syndrome)    Insomnia    Internal hemorrhoids    Kidney cysts    RT KIDNEY   Kidney stones 2009   s/p lithotripsy   Mild CAD    Mild valvular heart disease    Nephrolithiasis 04/13/2014   Obesity    PFO (patent foramen ovale)    PONV (postoperative nausea and vomiting)    Pulmonary nodule    Shortness of breath    unable to lie flat   Ventral hernia    Past Surgical History:  Procedure Laterality Date   ABDOMINAL HYSTERECTOMY  2002   APPENDECTOMY     BACK SURGERY     Can't take any medical procedure in right arm   BREAST BIOPSY     right   BREAST SURGERY N/A    Phreesia 04/22/2020   CATARACT EXTRACTION W/ INTRAOCULAR LENS  IMPLANT, BILATERAL  2009-2010   CESAREAN SECTION  1973; 1978; [place] 04/22/2020   COLONOSCOPY WITH PROPOFOL N/A 10/29/2016   Procedure: COLONOSCOPY WITH PROPOFOL;  Surgeon: Danie Binder, MD;  [facility];  Service: Endoscopy;  Laterality: N/A;  10:00 am   CYSTOSCOPY Left 04/13/2014   Procedure: CYSTOSCOPY FLEXIBLE;  Surgeon: Ardis Hughs, MD;  Location: [facility];  Service: Urology;  Laterality: Left;  with STENT   ESOPHAGOGASTRODUODENOSCOPY (EGD) WITH PROPOFOL N/A 10/29/2016   Procedure: ESOPHAGOGASTRODUODENOSCOPY (EGD) WITH PROPOFOL;  Surgeon: Danie Binder, MD;  [facility];  Service: Endoscopy;  Laterality: N/A;   EYE SURGERY     implant and screws put in the right lower jaw  11/2008   dental surgery   kidney blockage  ?  1990's   " major surgery  ;put kidney on pump for awhile"   LITHOTRIPSY     "several times"   LUMBAR FUSION  08/2010   MASTECTOMY  1997   right   NEPHROLITHOTOMY Left 04/13/2014   Procedure: LEFT PERCUTANEOUS NEPHROLITHOTOMY WITH SURGEON ACCESS;  Surgeon: Ardis Hughs, MD;  Location: [facility];  Service: Urology;  Laterality: Left;   OVARIAN CYST SURGERY     PARATHYROIDECTOMY  02/26/2012   Procedure: PARATHYROIDECTOMY;  Surgeon: Ascencion Dike, MD;  Location: [facility];  Service: ENT;;   SAVORY DILATION N/A 10/29/2016   Procedure: SAVORY DILATION;  Surgeon: Danie Binder, MD;  [facility];  Service: Endoscopy;  Laterality: N/A;   [place]  2011   urological surgery for blocked ureter secondary to kidney stone     Family History  Problem Relation Age of Onset   Birth defects Mother    Heart attack Mother    Cancer Brother        Esophageal; smoker; deceased 55s   Colon cancer Brother    Diabetes Daughter    Leukemia Paternal Aunt        deceased 62s   Pancreatic cancer Paternal Aunt        deceased 1   Cancer Paternal Aunt        GI cancer; deceased 5s   Cancer Cousin        kidney cancer; deceased 52; pat first cousin; daughter of aunt w/ leukemia   Cancer Cousin        colon cancer @ 26; pat first cousin; son of aunt with GI cancer   Anesthesia problems Neg Hx    Social History   Socioeconomic History   Marital status: Married    Spouse name: Not on file   Number of children: 3   Years of education: Not on file   Highest education level: Not on file  Occupational History   Occupation: retired in 2011  Tobacco Use   Smoking status: Never   Smokeless tobacco: Never  Vaping Use   Vaping Use: Never used  Substance and Sexual Activity   Alcohol use: Yes    Alcohol/week: 0.0 standard drinks of alcohol    Comment: X2 DRINKS PER YEAR   Drug use: No   Sexual activity: Not Currently    Birth control/protection: Surgical  Other Topics Concern   Not on file  Social History Narrative    Not on file   Social Determinants of Health   Financial Resource Strain: Low Risk  (04/25/2020)   Overall Financial Resource Strain (CARDIA)    Difficulty of Paying Living Expenses: Not very hard  Food Insecurity: Not on file  Transportation Needs: No Transportation Needs (08/23/2021)   PRAPARE - Hydrologist (Medical): No    Lack of Transportation (Non-Medical): No  Physical Activity: Not on file  Stress: Not on file  Social Connections: Not on file    Tobacco Counseling Counseling given: Not Answered   Clinical Intake:                 Diabetic?***         Activities of Daily Living     No data to display           Patient Care Team: Alvira Monday, [place] as PCP - General (Family Medicine) Fay Records, MD as PCP - Cardiology (Cardiology) Ned Grace, DDS (Inactive) as Consulting Physician (Dentistry) Amalia Hailey,  Leslye Peer, DPM (Inactive) as Consulting Physician (Podiatry) Jorja Loa, Hewitt Blade, MD (Ophthalmology) Leta Baptist, MD as Attending Physician (Otolaryngology) Edythe Clarity, [facility] as Pharmacist (Pharmacist)  Indicate any recent Medical Services you may have received from other than [facility] providers in the past year (date may be approximate).     Assessment:   This is a routine wellness examination for [facility].  Hearing/Vision screen No results found.  Dietary issues and exercise activities discussed:     Goals Addressed   None   Depression Screen    06/06/2022    1:32 PM 05/02/2022   10:44 AM 02/08/2022   11:47 AM 12/31/2021    9:46 AM 08/30/2021   10:15 AM 04/26/2021    8:57 AM 12/26/2020    8:06 AM  PHQ 2/9 Scores  PHQ - 2 Score 0 0 0 0 0 1 0    Fall Risk    06/06/2022    1:32 PM 05/02/2022   10:44 AM 02/08/2022   11:47 AM 12/31/2021    9:46 AM 08/30/2021   10:15 AM  Fall Risk   Falls in the past year? _0 0 0  Number falls in past yr: 1 1 0 0 0  Injury with Fall? _1 0 0  Risk for fall due to :  History of fall(s) History of fall(s) History of fall(s) No Fall Risks No Fall Risks  Follow up  Falls evaluation completed Falls evaluation completed Falls evaluation completed Falls evaluation completed    [place]:  Any stairs in or around the home? {YES/NO:21197} If so, are there any without handrails? {YES/NO:21197} Home free of loose throw rugs in walkways, pet beds, electrical cords, etc? {YES/NO:21197} Adequate lighting in your home to reduce risk of falls? {YES/NO:21197}  ASSISTIVE DEVICES UTILIZED TO PREVENT FALLS:  Life alert? {YES/NO:21197} Use of a cane, walker or w/c? {YES/NO:21197} Grab bars in the bathroom? {YES/NO:21197} Shower chair or bench in shower? {YES/NO:21197} Elevated toilet seat or a handicapped toilet? {YES/NO:21197}  TIMED UP AND GO:  Was the test performed? {YES/NO:21197}.  Length of time to ambulate 10 feet: *** sec.   {Appearance of PYKD:9833825}  Cognitive Function:        08/23/2021    9:50 AM  6CIT Screen  What Year? 0 points  What month? 0 points  What time? 0 points  Count back from 20 0 points  Months in reverse 0 points  Repeat phrase 0 points  Total Score 0 points    Immunizations Immunization History  Administered Date(s) Administered   Influenza Whole 07/02/2006   Influenza, High Dose Seasonal PF 06/10/2018, 06/30/2019   Influenza,inj,Quad PF,6+ Mos 06/03/2013, 06/10/2014, 07/13/2015, 07/03/2016   Influenza-Unspecified 06/24/2019   PFIZER(Purple Top)SARS-COV-2 Vaccination 10/25/2019, 11/21/2019, 07/22/2020   Pneumococcal Conjugate-13 06/27/2014   Pneumococcal Polysaccharide-23 01/16/2018   Pneumococcal-Unspecified 12/23/2011   Tdap 12/11/2011    {TDAP status:2101805}  {Flu Vaccine status:2101806}  {Pneumococcal vaccine status:2101807}  {Covid-19 vaccine status:2101808}  Qualifies for Shingles Vaccine? {YES/NO:21197}  Zostavax completed {YES/NO:21197}  {Shingrix  Completed?:2101804}  Screening Tests Health Maintenance  Topic Date Due   Zoster Vaccines- Shingrix (1 of 2) Never done   OPHTHALMOLOGY EXAM  09/09/2021   DTaP/Tdap/Td (2 - Td or Tdap) 12/10/2021   INFLUENZA VACCINE  04/23/2022   COVID-19 Vaccine (4 - 2023-24 season) 05/24/2022   HEMOGLOBIN A1C  11/06/2022   Diabetic kidney evaluation - Urine ACR  01/01/2023   FOOT EXAM  05/03/2023  Diabetic kidney evaluation - eGFR measurement  06/27/2023   Medicare Annual Wellness (AWV)  09/03/2023   COLONOSCOPY (Pts 45-76yr Insurance coverage will need to be confirmed)  10/29/2026   Pneumonia Vaccine 75 Years old  Completed   DEXA SCAN  Completed   Hepatitis C Screening  Completed   HPV VACCINES  Aged Out    Health Maintenance  Health Maintenance Due  Topic Date Due   Zoster Vaccines- Shingrix (1 of 2) Never done   OPHTHALMOLOGY EXAM  09/09/2021   DTaP/Tdap/Td (2 - Td or Tdap) 12/10/2021   INFLUENZA VACCINE  04/23/2022   COVID-19 Vaccine (4 - 2023-24 season) 05/24/2022    {Colorectal cancer screening:2101809}  {Mammogram status:21018020}  {Bone Density status:21018021}  Lung Cancer Screening: (Low Dose CT Chest recommended if Age 41-80 years, 30 pack-year currently smoking OR have quit w/in 15years.) {DOES NOT does:27190::"does not"} qualify.   Lung Cancer Screening Referral: ***  Additional Screening:  Hepatitis C Screening: {DOES NOT does:27190::"does not"} qualify; Completed ***  Vision Screening: Recommended annual ophthalmology exams for early detection of glaucoma and other disorders of the eye. Is the patient up to date with their annual eye exam?  {YES/NO:21197} Who is the provider or what is the name of the office in which the patient attends annual eye exams? *** If pt is not established with a provider, would they like to be referred to a provider to establish care? {YES/NO:21197}.   Dental Screening: Recommended annual dental exams for proper oral  hygiene  Community Resource Referral / Chronic Care Management: CRR required this visit?  {YES/NO:21197}  CCM required this visit?  {YES/NO:21197}     Plan:     I have personally reviewed and noted the following in the patient's chart:   Medical and social history Use of alcohol, tobacco or illicit drugs  Current medications and supplements including opioid prescriptions. {Opioid [phone]} Functional ability and status Nutritional status Physical activity Advanced directives List of other physicians Hospitalizations, surgeries, and ER visits in previous 12 months Vitals Screenings to include cognitive, depression, and falls Referrals and appointments  In addition, I have reviewed and discussed with patient certain preventive protocols, quality metrics, and best practice recommendations. A written personalized care plan for preventive services as well as general preventive health recommendations were provided to patient.     KJohny Drilling CDana  09/02/2022   Nurse Notes:  Ms. SMilsap, Thank you for taking time to come for your Medicare Wellness Visit. I appreciate your ongoing commitment to your health goals. Please review the following plan we discussed and let me know if I can assist you in the future.   These are the goals we discussed:  Goals      HEMOGLOBIN A1C < 7     Pharmacy Care Plan:     CARE PLAN ENTRY (see longitudinal plan of care for additional care plan information)  Current Barriers:  Chronic Disease Management support, education, and care coordination needs related to Hypertension, Hyperlipidemia, and Diabetes   Hypertension BP Readings from Last 3 Encounters:  04/17/20 (!) 132/76  04/12/20 130/80  02/28/20 128/74  Pharmacist Clinical Goal(s): Over the next 90 days, patient will work with PharmD and providers to maintain BP goal <130/80 Current regimen:  Bystolic 242PNdaily Interventions: Reviewed most recent office  BP Discussed diet/exercise Patient self care activities - Over the next 30 days, patient will: Check BP periodically, document, and provide at future appointments Ensure daily salt intake < 2300 mg/day Contact  providers with consistent BP > 130/80  Hyperlipidemia Lab Results  Component Value Date/Time   LDLCALC 110 (H) 12/15/2019 10:41 AM  Pharmacist Clinical Goal(s): Over the next 90 days, patient will work with PharmD and providers to achieve LDL goal < 70 Current regimen:  No medications Interventions: Reviewed most recent lipid panel Discussed history of intolerance to statin Patient self care activities - Over the next 30 days, patient will: Focus on diet low in saturated fats and sugars to bring down LDL cholesterol  Diabetes Lab Results  Component Value Date/Time   HGBA1C 7.8 (A) 04/12/2020 11:32 AM   HGBA1C 8.5 (H) 12/15/2019 10:41 AM   HGBA1C 8.2 (H) 04/09/2019 12:13 PM  Pharmacist Clinical Goal(s): Over the next 90 days, patient will work with PharmD and providers to achieve A1c goal <7% Current regimen:  Metformin 550m twice daily Glimepiride 446mdaily Interventions: Reviewed home monitoring and A1c Counseled on importance of eating with doses of glimepiride to avoid low blood sugars Discussed dietary modifications to promote euglycemia. Patient self care activities - Over the next 30 days, patient will: Check blood sugar once daily, document, and provide at future appointments Contact provider with any episodes of hypoglycemia Work on diet and exercise to meet goal A1c of < 7%. Please see past updates related to this goal by clicking on the "Past Updates" button in the selected goal          This is a list of the screening recommended for you and due dates:  Health Maintenance  Topic Date Due   Zoster (Shingles) Vaccine (1 of 2) Never done   Eye exam for diabetics  09/09/2021   DTaP/Tdap/Td vaccine (2 - Td or Tdap) 12/10/2021   Flu Shot  04/23/2022    COVID-19 Vaccine (4 - 2023-24 season) 05/24/2022   Hemoglobin A1C  11/06/2022   Yearly kidney health urinalysis for diabetes  01/01/2023   Complete foot exam   05/03/2023   Yearly kidney function blood test for diabetes  06/27/2023   Medicare Annual Wellness Visit  09/03/2023   Colon Cancer Screening  10/29/2026   Pneumonia Vaccine  Completed   DEXA scan (bone density measurement)  Completed   Hepatitis C Screening: USPSTF Recommendation to screen - Ages 186-79o.  Completed   HPV Vaccine  Aged Out

## 2022-09-03 NOTE — Progress Notes (Signed)
This encounter was created in error - please disregard.

## 2022-09-04 ENCOUNTER — Other Ambulatory Visit: Payer: Self-pay

## 2022-09-04 ENCOUNTER — Ambulatory Visit (INDEPENDENT_AMBULATORY_CARE_PROVIDER_SITE_OTHER): Payer: Medicare Other

## 2022-09-04 DIAGNOSIS — Z23 Encounter for immunization: Secondary | ICD-10-CM

## 2022-09-09 ENCOUNTER — Ambulatory Visit: Payer: Medicare Other | Admitting: Family Medicine

## 2022-09-17 ENCOUNTER — Encounter: Payer: Self-pay | Admitting: Family Medicine

## 2022-09-17 ENCOUNTER — Encounter: Payer: Self-pay | Admitting: Pharmacist

## 2022-09-17 ENCOUNTER — Ambulatory Visit: Payer: Medicare Other | Admitting: Family Medicine

## 2022-09-17 VITALS — BP 128/74 | HR 85 | Ht 66.5 in | Wt 172.0 lb

## 2022-09-17 DIAGNOSIS — G609 Hereditary and idiopathic neuropathy, unspecified: Secondary | ICD-10-CM

## 2022-09-17 DIAGNOSIS — G8929 Other chronic pain: Secondary | ICD-10-CM

## 2022-09-17 DIAGNOSIS — E1165 Type 2 diabetes mellitus with hyperglycemia: Secondary | ICD-10-CM | POA: Diagnosis not present

## 2022-09-17 DIAGNOSIS — M25562 Pain in left knee: Secondary | ICD-10-CM

## 2022-09-17 DIAGNOSIS — I10 Essential (primary) hypertension: Secondary | ICD-10-CM | POA: Diagnosis not present

## 2022-09-17 DIAGNOSIS — E038 Other specified hypothyroidism: Secondary | ICD-10-CM

## 2022-09-17 DIAGNOSIS — E7849 Other hyperlipidemia: Secondary | ICD-10-CM

## 2022-09-17 DIAGNOSIS — E559 Vitamin D deficiency, unspecified: Secondary | ICD-10-CM

## 2022-09-17 DIAGNOSIS — G629 Polyneuropathy, unspecified: Secondary | ICD-10-CM | POA: Insufficient documentation

## 2022-09-17 MED ORDER — GLIPIZIDE 10 MG PO TABS: 10.0000 mg | ORAL_TABLET | Freq: Two times a day (BID) | ORAL | 2 refills | Status: DC

## 2022-09-17 MED ORDER — METFORMIN HCL 500 MG PO TABS: 1000.0000 mg | ORAL_TABLET | Freq: Every day | ORAL | 0 refills | Status: DC

## 2022-09-17 NOTE — Patient Instructions (Addendum)
I appreciate the opportunity to provide care to you today!    Follow up:  3 months  Labs: please stop by the lab today to get your blood drawn (CBC, CMP, TSH, Lipid profile, HgA1c, Vit D)   Please pick your refills at the pharmacy    Please continue to a heart-healthy diet and increase your physical activities. Try to exercise for 55mns at least three times a week.      It was a pleasure to see you and I look forward to continuing to work together on your health and well-being. Please do not hesitate to call the office if you need care or have questions about your care.   Have a wonderful day and week. With Gratitude, GAlvira MondayMSN, FNP-BC

## 2022-09-17 NOTE — Assessment & Plan Note (Signed)
She complains of bilateral knee pain No signs of inflammation were noted on examination She reports that she has had 5 falls this year She follows up with orthopedics, and last saw Dr. Luna Glasgow on 06/13/2022 She reports using Voltaren gel as needed Pain is described as throbbing and sore She reports prolonged ankle ambulation aggravates her pain  Her pain is relieved by rest She takes Tylenol and Aleve as needed for pain relief She rates pain 8 out of 10 today, noting her pain to be constant Encourage symptomatic management with Tylenol as needed Encouraged to follow up with orthopedics if her symptoms worsen Patient verbalized understanding

## 2022-09-17 NOTE — Assessment & Plan Note (Deleted)
She complains of bilateral knee pain No signs of inflammation were noted on examination She reports that she has had 5 falls this year She follows up with orthopedics, and last saw Dr. Luna Glasgow on 06/13/2022 She reports using Voltaren gel as needed Pain is described as throbbing and sore She reports prolonged ankle ambulation aggravates her pain  Her pain is relieved by rest She takes Tylenol and Aleve as needed for pain relief She rates pain 8 out of 10 today, noting her pain to be constant Encourage symptomatic management with Tylenol as needed Encouraged to follow up with orthopedics if her symptoms worsen Patient verbalized understanding

## 2022-09-17 NOTE — Assessment & Plan Note (Signed)
She takes glipizide 10 mg twice daily, and metformin 1000 mg daily She denies polyuria, polyphagia, polydipsia She reports compliance with treatment regiment Will assess hemoglobin A1c today Lab Results  Component Value Date   HGBA1C 9.2 (H) 05/06/2022

## 2022-09-17 NOTE — Assessment & Plan Note (Addendum)
She complains of numbness and tingling in her hands and feet She takes Lyrica 75 mg daily She reports minimal relief of her symptoms She reports following up with her podiatrist Dr. Earleen Newport Encourage the patient to follow up with Dr. Earleen Newport and continue to Lyrica 75 mg daily

## 2022-09-17 NOTE — Progress Notes (Signed)
Established Patient Office Visit  Subjective:  Patient ID: [name], female    DOB: [birth year]  Age: 76 y.o. MRN: [id number]  CC:  Chief Complaint  Patient presents with   Follow-up    3 month f/u , pt reports knee pain, pt has questions regarding medications.     HPI [name] is a 76 y.o. female with past medical history of essential hypertension, type 2 diabetes, and  hyperlipidemia presents for f/u of  chronic medical conditions. For the details of today's visit, please refer to the assessment and plan.     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   right / chemo + tamoxifen x 5 years    Anemia    Anxiety    Aortic atherosclerosis ([place])    Arthritis    Blood transfusion 1980's   Breast cancer ([place])    Cancer ([place])    Phreesia 06/15/3006   Complication of anesthesia    has a hard time waking up; can't lay flat   Degenerative disc disease, lumbar    pressing on L3 and L4   Diabetes mellitus ([place]) 1989   Type 2 NIDDM; "cancer treatment gave me diabetes"   Diabetes mellitus without complication ([place])    Phreesia 08/18/2020   Diverticulosis    DJD (degenerative joint disease) of lumbar spine    Dysrhythmia    "irregular"   Exertional dyspnea    Gastroparesis    GERD (gastroesophageal reflux disease)    mann   Heart murmur    "think I outgrew it"   Hepatic steatosis    History of kidney stones    History of stomach ulcers    Hx: UTI (urinary tract infection)    Hyperlipidemia    Hypersensitivity    in tongue   Hypertension    IBS (irritable bowel syndrome)    Insomnia    Internal hemorrhoids    Kidney cysts    RT KIDNEY   Kidney stones 2009   s/p lithotripsy   Mild CAD    Mild valvular heart disease    Nephrolithiasis 04/13/2014   Obesity    PFO (patent foramen ovale)    PONV (postoperative  nausea and vomiting)    Pulmonary nodule    Shortness of breath    unable to lie flat   Ventral hernia     Past Surgical History:  Procedure Laterality Date   ABDOMINAL HYSTERECTOMY  2002   APPENDECTOMY     BACK SURGERY     Can't take any medical procedure in right arm   BREAST BIOPSY     right   BREAST SURGERY N/A    Phreesia 04/22/2020   CATARACT EXTRACTION W/ INTRAOCULAR LENS  IMPLANT, BILATERAL  2009-2010   CESAREAN SECTION  1973; 1978; [place] 04/22/2020   COLONOSCOPY WITH PROPOFOL N/A 10/29/2016   Procedure: COLONOSCOPY WITH PROPOFOL;  Surgeon: Danie Binder, MD;  [facility];  Service: Endoscopy;  Laterality: N/A;  10:00 am   CYSTOSCOPY Left 04/13/2014   Procedure: CYSTOSCOPY FLEXIBLE;  Surgeon: Ardis Hughs, MD;  Location: [facility];  Service: Urology;  Laterality: Left;  with STENT   ESOPHAGOGASTRODUODENOSCOPY (EGD) WITH PROPOFOL N/A 10/29/2016   Procedure: ESOPHAGOGASTRODUODENOSCOPY (EGD) WITH PROPOFOL;  Surgeon: Danie Binder, MD;  [facility];  Service: Endoscopy;  Laterality: N/A;   EYE SURGERY     implant and screws put in the right lower jaw  11/2008   dental surgery   kidney blockage  ?1990's   " major surgery ;put kidney on pump for awhile"   LITHOTRIPSY     "several times"   LUMBAR FUSION  08/2010   MASTECTOMY  1997   right   NEPHROLITHOTOMY Left 04/13/2014   Procedure: LEFT PERCUTANEOUS NEPHROLITHOTOMY WITH SURGEON ACCESS;  Surgeon: Ardis Hughs, MD;  Location: [facility];  Service: Urology;  Laterality: Left;   OVARIAN CYST SURGERY     PARATHYROIDECTOMY  02/26/2012   Procedure: PARATHYROIDECTOMY;  Surgeon: Ascencion Dike, MD;  Location: [facility];  Service: ENT;;   SAVORY DILATION N/A 10/29/2016   Procedure: SAVORY DILATION;  Surgeon: Danie Binder, MD;  [facility];  Service: Endoscopy;  Laterality: N/A;   [place]  2011   urological surgery for blocked ureter secondary to kidney stone       Family History  Problem Relation Age of Onset   Birth defects Mother    Heart attack Mother    Cancer Brother        Esophageal; smoker; deceased 34s   Colon cancer Brother    Diabetes Daughter    Leukemia Paternal Aunt        deceased 50s   Pancreatic cancer Paternal Aunt        deceased 44   Cancer Paternal Aunt        GI cancer; deceased 55s   Cancer Cousin        kidney cancer; deceased 60; pat first cousin; daughter of aunt w/ leukemia   Cancer Cousin        colon cancer @ 39; pat first cousin; son of aunt with GI cancer   Anesthesia problems Neg Hx     Social History   Socioeconomic History   Marital status: Married    Spouse name: Not on file   Number of children: 3   Years of education: Not on file   Highest education level: Not on file  Occupational History   Occupation: retired in 2011  Tobacco Use   Smoking status: Never   Smokeless tobacco: Never  Vaping Use   Vaping Use: Never used  Substance and Sexual Activity   Alcohol use: Yes    Alcohol/week: 0.0 standard drinks of alcohol    Comment: X2 DRINKS PER YEAR   Drug use: No   Sexual activity: Not Currently    Birth control/protection: Surgical  Other Topics Concern   Not on file  Social History Narrative   Not on file   Social Determinants of Health   Financial Resource Strain: Low Risk  (04/25/2020)   Overall Financial Resource Strain (CARDIA)    Difficulty of Paying Living Expenses: Not very hard  Food Insecurity: Not on file  Transportation Needs: No Transportation Needs (08/23/2021)   PRAPARE - Hydrologist (Medical): No    Lack of Transportation (Non-Medical): No  Physical Activity: Not on file  Stress: Not on file  Social Connections: Not on file  Intimate Partner Violence: Not on file    Outpatient Medications Prior to Visit  Medication Sig Dispense Refill   acetaminophen (TYLENOL) 325 MG tablet Take 325 mg by mouth every 6 (six) hours as needed for  mild pain.     Blood Glucose Monitoring Suppl (BLOOD  GLUCOSE SYSTEM PAK) KIT Use as directed to monitor FSBS 1x daily. Dx: E11.9 1 each 1   Cholecalciferol (VITAMIN D3 PO) Take 1 tablet by mouth daily.     CINNAMON PO Take 1 capsule by mouth. Takes sometimes     diclofenac Sodium (VOLTAREN) 1 % GEL Apply 4 g topically 4 (four) times daily. 4 g 1   famotidine (PEPCID) 20 MG tablet Take 1 tablet (20 mg total) by mouth 2 (two) times daily. 60 tablet 5   furosemide (LASIX) 40 MG tablet Take 1 tablet (40 mg total) by mouth daily as needed (FOR SWELLING). 90 tablet 0   glucose blood (ACCU-CHEK GUIDE) test strip 1 each by Other route as needed for other. Use as instructed to check blood sugar daily. 100 each 3   LANCETS SUPER THIN 28G MISC Use to check blood sugar once daily. 100 each 3   metoprolol tartrate (LOPRESSOR) 25 MG tablet Take 1 tablet (25 mg total) by mouth 2 (two) times daily. 180 tablet 3   mirabegron ER (MYRBETRIQ) 25 MG TB24 tablet Take 1 tablet (25 mg total) by mouth daily. 90 each 1   Multiple Vitamin (MULTIVITAMIN) tablet Take 1 tablet by mouth daily.     potassium chloride (KLOR-CON) 10 MEQ tablet Take 1 tablet (10 mEq total) by mouth daily. 90 tablet 1   pregabalin (LYRICA) 75 MG capsule Take 1 capsule (75 mg total) by mouth at bedtime. 90 capsule 0   Semaglutide (RYBELSUS) 7 MG TABS Take 7 mg by mouth daily. 30 tablet 0   triamcinolone (KENALOG) 0.025 % ointment Apply 1 Application topically 2 (two) times daily. 30 g 0   glipiZIDE (GLUCOTROL) 10 MG tablet TAKE (1) TABLET BY MOUTH TWICE DAILY BEFORE A MEAL 60 tablet 0   metFORMIN (GLUCOPHAGE) 500 MG tablet Take 2 tablets (1,000 mg total) by mouth 2 (two) times daily with a meal. 180 tablet 0   amLODipine (NORVASC) 5 MG tablet Take 1 tablet (5 mg total) by mouth daily. 90 tablet 1   No facility-administered medications prior to visit.    Allergies  Allergen Reactions   Demerol [Meperidine] Other (See Comments)    Lost  consciousness   Bentyl [Dicyclomine Hcl] Swelling   Invokana [Canagliflozin] Hives   Metformin And Related Diarrhea   Protonix [Pantoprazole Sodium] Swelling   Statins Other (See Comments)    myalgia   Ace Inhibitors Cough    ROS Review of Systems  Constitutional:  Negative for chills and fever.  Eyes:  Negative for visual disturbance.  Respiratory:  Negative for chest tightness and shortness of breath.   Neurological:  Positive for numbness (hands and feet). Negative for dizziness and headaches.      Objective:    Physical Exam HENT:     Head: Normocephalic.     Mouth/Throat:     Mouth: Mucous membranes are moist.  Cardiovascular:     Rate and Rhythm: Normal rate.     Heart sounds: Normal heart sounds.  Pulmonary:     Effort: Pulmonary effort is normal.     Breath sounds: Normal breath sounds.  Neurological:     Mental Status: She is alert.     BP 128/74   Pulse 85   Ht 5' 6.5" (1.689 m)   Wt 172 lb 0.6 oz (78 kg)   SpO2 98%   BMI 27.35 kg/m  Wt Readings from Last 3 Encounters:  09/17/22 172 lb 0.6 oz (78 kg)  07/29/22 171 lb (  77.6 kg)  06/26/22 168 lb 12.8 oz (76.6 kg)    Lab Results  Component Value Date   TSH 0.748 06/26/2022   Lab Results  Component Value Date   WBC 10.6 06/26/2022   HGB 11.2 06/26/2022   HCT 35.1 06/26/2022   MCV 78 (L) 06/26/2022   PLT 293 06/26/2022   Lab Results  Component Value Date   NA 142 06/26/2022   K 3.9 06/26/2022   CO2 22 06/26/2022   GLUCOSE 152 (H) 06/26/2022   BUN 14 06/26/2022   CREATININE 1.03 (H) 06/26/2022   BILITOT 0.3 06/26/2022   ALKPHOS 68 06/26/2022   AST 18 06/26/2022   ALT 15 06/26/2022   PROT 7.0 06/26/2022   ALBUMIN 4.2 06/26/2022   CALCIUM 9.6 06/26/2022   ANIONGAP 7 02/11/2022   EGFR 57 (L) 06/26/2022   GFR 55.96 (L) 02/23/2020   Lab Results  Component Value Date   CHOL 203 (H) 08/30/2021   Lab Results  Component Value Date   HDL 67 08/30/2021   Lab Results  Component Value  Date   LDLCALC 116 (H) 08/30/2021   Lab Results  Component Value Date   TRIG 116 08/30/2021   Lab Results  Component Value Date   CHOLHDL 3.0 08/30/2021   Lab Results  Component Value Date   HGBA1C 9.2 (H) 05/06/2022      Assessment & Plan:  Uncontrolled type 2 diabetes mellitus with hyperglycemia, without long-term current use of insulin (HCC) Assessment & Plan: She takes glipizide 10 mg twice daily, and metformin 1000 mg daily She denies polyuria, polyphagia, polydipsia She reports compliance with treatment regiment Will assess hemoglobin A1c today Lab Results  Component Value Date   HGBA1C 9.2 (H) 05/06/2022     Orders: -     Hemoglobin A1c -     glipiZIDE; Take 1 tablet (10 mg total) by mouth 2 (two) times daily before a meal.  Dispense: 90 tablet; Refill: 2  Idiopathic peripheral neuropathy Assessment & Plan: She complains of numbness and tingling in her hands and feet She takes Lyrica 75 mg daily She reports minimal relief of her symptoms She reports following up with her podiatrist Dr. Earleen Newport Encourage the patient to follow up with Dr. Earleen Newport and continue to Lyrica 75 mg daily   Essential hypertension Assessment & Plan: Controlled She takes amlodipine 5 mg daily, metoprolol 25 mg twice daily and furosemide 40 mg daily as needed She reports taking potassium 10 MEQ supplement as needed She denies headaches, dizziness, blurred vision Will assess CMP and CBC levels today Encouraged to continue treatment regimen BP Readings from Last 3 Encounters:  09/17/22 128/74  07/29/22 126/64  07/16/22 132/78      Chronic pain of left knee Assessment & Plan: She complains of bilateral knee pain No signs of inflammation were noted on examination She reports that she has had 5 falls this year She follows up with orthopedics, and last saw Dr. Luna Glasgow on 06/13/2022 She reports using Voltaren gel as needed Pain is described as throbbing and sore She reports prolonged  ankle ambulation aggravates her pain  Her pain is relieved by rest She takes Tylenol and Aleve as needed for pain relief She rates pain 8 out of 10 today, noting her pain to be constant Encourage symptomatic management with Tylenol as needed Encouraged to follow up with orthopedics if her symptoms worsen Patient verbalized understanding   Vitamin D deficiency -     VITAMIN D 25 Hydroxy (Vit-D Deficiency,  Fractures)  Other specified hypothyroidism -     TSH + free T4  Other hyperlipidemia -     Lipid panel -     CMP14+EGFR -     CBC with Differential/Platelet  Other orders -     metFORMIN HCl; Take 2 tablets (1,000 mg total) by mouth daily.  Dispense: 180 tablet; Refill: 0    Follow-up: Return in about 3 months (around 12/17/2022).   Alvira Monday, FNP

## 2022-09-17 NOTE — Progress Notes (Signed)
[facility])                                            [place] Team                                        Statin Quality Measure Assessment    09/17/2022  [name] December 26, 1945 [id number]   I am a [facility] clinical pharmacist that reviews patients for statin quality initiatives.     Per review of chart and payor information, [name] has a diagnosis of diabetes but is not currently filling a statin prescription.  This places patient into the SUPD (Statin Use In Patients with Diabetes) measure for CMS.    Patient has documented trials of statins with reported myalgias, but no corresponding CPT codes that would exclude patient from SUPD measure.  A code can still be attached to today's office visit if clinically appropriate.  This would remover her form the measure for 2023.    Code for past statin intolerance or  other exclusions (required annually)  Provider Requirements: Associate code during an office visit or telehealth encounter  Drug Induced Myopathy G72.0   Myopathy, unspecified G72.9   Myositis, unspecified M60.9   Rhabdomyolysis V49.44   Alcoholic fatty liver H67.5   Cirrhosis of liver K74.69   Prediabetes R73.03    Thank you for your consideration, Curlene Labrum, PharmD [facility] Pharmacist Office: [phone]

## 2022-09-17 NOTE — Assessment & Plan Note (Addendum)
Controlled She takes amlodipine 5 mg daily, metoprolol 25 mg twice daily and furosemide 40 mg daily as needed She reports taking potassium 10 MEQ supplement as needed She denies headaches, dizziness, blurred vision Will assess CMP and CBC levels today Encouraged to continue treatment regimen BP Readings from Last 3 Encounters:  09/17/22 128/74  07/29/22 126/64  07/16/22 132/78

## 2022-09-18 LAB — CBC WITH DIFFERENTIAL/PLATELET
Basophils Absolute: 0.1 10*3/uL (ref 0.0–0.2)
Basos: 1 %
EOS (ABSOLUTE): 0.2 10*3/uL (ref 0.0–0.4)
Eos: 2 %
Hematocrit: 32.1 % — ABNORMAL LOW (ref 34.0–46.6)
Hemoglobin: 10.9 g/dL — ABNORMAL LOW (ref 11.1–15.9)
Immature Grans (Abs): 0 10*3/uL (ref 0.0–0.1)
Immature Granulocytes: 0 %
Lymphocytes Absolute: 2.8 10*3/uL (ref 0.7–3.1)
Lymphs: 33 %
MCH: 26.2 pg — ABNORMAL LOW (ref 26.6–33.0)
MCHC: 34 g/dL (ref 31.5–35.7)
MCV: 77 fL — ABNORMAL LOW (ref 79–97)
Monocytes Absolute: 0.6 10*3/uL (ref 0.1–0.9)
Monocytes: 8 %
Neutrophils Absolute: 4.8 10*3/uL (ref 1.4–7.0)
Neutrophils: 56 %
Platelets: 240 10*3/uL (ref 150–450)
RBC: 4.16 x10E6/uL (ref 3.77–5.28)
RDW: 13.2 % (ref 11.7–15.4)
WBC: 8.5 10*3/uL (ref 3.4–10.8)

## 2022-09-18 LAB — CMP14+EGFR
ALT: 12 IU/L (ref 0–32)
AST: 18 IU/L (ref 0–40)
Albumin/Globulin Ratio: 1.3 (ref 1.2–2.2)
Albumin: 3.8 g/dL (ref 3.8–4.8)
Alkaline Phosphatase: 67 IU/L (ref 44–121)
BUN/Creatinine Ratio: 10 — ABNORMAL LOW (ref 12–28)
BUN: 12 mg/dL (ref 8–27)
Bilirubin Total: 0.4 mg/dL (ref 0.0–1.2)
CO2: 23 mmol/L (ref 20–29)
Calcium: 9.1 mg/dL (ref 8.7–10.3)
Chloride: 101 mmol/L (ref 96–106)
Creatinine, Ser: 1.16 mg/dL — ABNORMAL HIGH (ref 0.57–1.00)
Globulin, Total: 3 g/dL (ref 1.5–4.5)
Glucose: 186 mg/dL — ABNORMAL HIGH (ref 70–99)
Potassium: 3.9 mmol/L (ref 3.5–5.2)
Sodium: 140 mmol/L (ref 134–144)
Total Protein: 6.8 g/dL (ref 6.0–8.5)
eGFR: 49 mL/min/{1.73_m2} — ABNORMAL LOW (ref >59)

## 2022-09-18 LAB — TSH+FREE T4
Free T4: 1.35 ng/dL (ref 0.82–1.77)
TSH: 0.844 u[IU]/mL (ref 0.450–4.500)

## 2022-09-18 LAB — LIPID PANEL
Chol/HDL Ratio: 2.5 ratio (ref 0.0–4.4)
Cholesterol, Total: 201 mg/dL — ABNORMAL HIGH (ref 100–199)
HDL: 82 mg/dL (ref >39)
LDL Chol Calc (NIH): 101 mg/dL — ABNORMAL HIGH (ref 0–99)
Triglycerides: 104 mg/dL (ref 0–149)
VLDL Cholesterol Cal: 18 mg/dL (ref 5–40)

## 2022-09-18 LAB — HEMOGLOBIN A1C
Est. average glucose Bld gHb Est-mCnc: 200 mg/dL
Hgb A1c MFr Bld: 8.6 % — ABNORMAL HIGH (ref 4.8–5.6)

## 2022-09-18 LAB — VITAMIN D 25 HYDROXY (VIT D DEFICIENCY, FRACTURES): Vit D, 25-Hydroxy: 23.1 ng/mL — ABNORMAL LOW (ref 30.0–100.0)

## 2022-09-19 ENCOUNTER — Other Ambulatory Visit: Payer: Self-pay | Admitting: Family Medicine

## 2022-09-24 ENCOUNTER — Other Ambulatory Visit: Payer: Self-pay | Admitting: Internal Medicine

## 2022-09-24 DIAGNOSIS — I10 Essential (primary) hypertension: Secondary | ICD-10-CM

## 2022-09-30 ENCOUNTER — Other Ambulatory Visit: Payer: Self-pay | Admitting: Family Medicine

## 2022-09-30 DIAGNOSIS — E782 Mixed hyperlipidemia: Secondary | ICD-10-CM

## 2022-09-30 MED ORDER — EZETIMIBE 10 MG PO TABS: 10.0000 mg | ORAL_TABLET | Freq: Every day | ORAL | 3 refills | Status: DC

## 2022-09-30 MED ORDER — METFORMIN HCL 500 MG PO TABS: 1000.0000 mg | ORAL_TABLET | Freq: Two times a day (BID) | ORAL | 0 refills | Status: DC

## 2022-09-30 NOTE — Progress Notes (Signed)
Please inform the patient that a prescription for metformin at 1000 mg twice daily has been sent to her pharmacy for her to take.  Please encourage her to continue taking glipizide 10 mg daily.  Her vitamin D is slightly low; I recommend she continue taking her vitamin D supplement.  Her cholesterol levels are elevated. I recommend lifestyle modification by decreasing your intake of saturated and trans fat, having a low-carb diet, and increasing physical activity.  A prescription for ezetimibe 10 mg daily has been sent to her pharmacy to take for her cholesterol. please encourage the patient to increase her fluid intake, as her labs indicate she is dehydrated.  I also recommend increasing her intake of iron-rich foods.

## 2022-09-30 NOTE — Progress Notes (Signed)
The 10-year ASCVD risk score (Arnett DK, et al., 2019) is: 37.5%   Values used to calculate the score:     Age: 77 years     Sex: Female     Is Non-Hispanic African American: Yes     Diabetic: Yes     Tobacco smoker: No     Systolic Blood Pressure: 591 mmHg     Is BP treated: Yes     HDL Cholesterol: 82 mg/dL     Total Cholesterol: 201 mg/dL

## 2022-10-07 ENCOUNTER — Other Ambulatory Visit (HOSPITAL_COMMUNITY): Payer: Medicare Other

## 2022-10-09 ENCOUNTER — Ambulatory Visit: Payer: Medicare Other | Admitting: Internal Medicine

## 2022-10-10 ENCOUNTER — Encounter: Payer: Self-pay | Admitting: Internal Medicine

## 2022-10-10 DIAGNOSIS — R296 Repeated falls: Secondary | ICD-10-CM

## 2022-10-10 DIAGNOSIS — R55 Syncope and collapse: Secondary | ICD-10-CM

## 2022-10-10 NOTE — Telephone Encounter (Signed)
REcomm 1  Noncontrast CT head to r/o hematoma given falls 2   Carotid USN 3  Referral to neurology

## 2022-10-11 ENCOUNTER — Ambulatory Visit: Payer: Medicare Other | Attending: Internal Medicine | Admitting: Internal Medicine

## 2022-10-11 ENCOUNTER — Encounter: Payer: Self-pay | Admitting: Internal Medicine

## 2022-10-11 VITALS — BP 144/70 | HR 84 | Ht 66.5 in | Wt 173.6 lb

## 2022-10-11 DIAGNOSIS — R42 Dizziness and giddiness: Secondary | ICD-10-CM

## 2022-10-11 NOTE — Patient Instructions (Signed)
Medication Instructions:   *If you need a refill on your cardiac medications before your next appointment, please call your pharmacy*   Lab Work:  If you have labs (blood work) drawn today and your tests are completely normal, you will receive your results only by: MyChart Message (if you have MyChart) OR A paper copy in the mail If you have any lab test that is abnormal or we need to change your treatment, we will call you to review the results.   Testing/Procedures:    Follow-Up: At [facility], you and your health needs are our priority.  As part of our continuing mission to provide you with exceptional heart care, we have created designated Provider Care Teams.  These Care Teams include your primary Cardiologist (physician) and Advanced Practice Providers (APPs -  Physician Assistants and Nurse Practitioners) who all work together to provide you with the care you need, when you need it.  We recommend signing up for the patient portal called "MyChart".  Sign up information is provided on this After Visit Summary.  MyChart is used to connect with patients for Virtual Visits (Telemedicine).  Patients are able to view lab/test results, encounter notes, upcoming appointments, etc.  Non-urgent messages can be sent to your provider as well.   To learn more about what you can do with MyChart, go to [email or web address].    

## 2022-10-11 NOTE — Progress Notes (Signed)
Cardiology Office Note   Date:  10/11/2022   ID:  [name], [name] [birth year], MRN [id number]  PCP:  Lindell Spar, MD  Cardiologist:   Dorris Carnes, MD   F/U of HTN    History of Present Illness: [name] is a 77 y.o. female with a history of dyspnea, hypertension, atherosclerosis, hyperlipidemia, DM, CVA, severe LVH, fatty liver dz, PUD, pulmonary nodule and  irritable bowel.  Stress Myoview in 2013 and 2019 were normla   Echo in 2019 showed severe LVH  LVEF 55 to 60%  G1 DD.   The pt was last seen in cardiology by D DUnn in October 2023  She complained of increased SOB along with some atypical chest tightness.   SHe had had 4 falls previously  Echo was done showing severe LVH  LVEF 60 to 65%   CT coronary angiogram showed mild nonobstructive CAD, atherosclerosis of aorta, subacute fx of 4th rib.   CMR showed norm LV size/function.  Mld LVH   Mild AS  Mild MR  No infiltrative dz.    She was seen in follow up in November   Said she had stopped several of her medicines are time of scans and felt better .  Felt she was overmedicated   Since then she has continued to stay off of meds    SHe notes occasional CP not associated with activity   Does get winded at times with talking.  Does feel occasoinal dizziness, "spaciness"  No syncope   No palpitations .   Current Meds  Medication Sig   amLODipine (NORVASC) 5 MG tablet TAKE ONE TABLET BY MOUTH ONCE DAILY.   glipiZIDE (GLUCOTROL) 10 MG tablet Take 1 tablet (10 mg total) by mouth 2 (two) times daily before a meal.   metFORMIN (GLUCOPHAGE) 500 MG tablet Take 2 tablets (1,000 mg total) by mouth 2 (two) times daily with a meal.     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   right / chemo + tamoxifen x 5 years    Anemia    Anxiety    Aortic atherosclerosis ([place])    Arthritis    Blood transfusion 1980's   Breast cancer ([place])    Cancer ([place])    Phreesia 71/69/6789   Complication of anesthesia    has a hard time waking up; can't lay flat   Degenerative disc disease, lumbar    pressing on L3 and L4   Diabetes mellitus ([place]) 1989   Type 2 NIDDM; "cancer treatment gave me diabetes"   Diabetes mellitus without complication ([place])    Phreesia 08/18/2020   Diverticulosis    DJD (degenerative joint disease) of lumbar spine    Dysrhythmia    "irregular"   Exertional dyspnea    Gastroparesis    GERD (gastroesophageal reflux disease)    mann   Heart murmur    "think I outgrew it"   Hepatic steatosis    History of kidney stones    History of stomach ulcers    Hx: UTI (urinary tract infection)    Hyperlipidemia    Hypersensitivity    in tongue   Hypertension    IBS (irritable bowel syndrome)  Insomnia    Internal hemorrhoids    Kidney cysts    RT KIDNEY   Kidney stones 2009   s/p lithotripsy   Mild CAD    Mild valvular heart disease    Nephrolithiasis 04/13/2014   Obesity    PFO (patent foramen ovale)    PONV (postoperative nausea and vomiting)    Pulmonary nodule    Shortness of breath    unable to lie flat   Ventral hernia     Past Surgical History:  Procedure Laterality Date   ABDOMINAL HYSTERECTOMY  2002   APPENDECTOMY     BACK SURGERY     Can't take any medical procedure in right arm   BREAST BIOPSY     right   BREAST SURGERY N/A    Phreesia 04/22/2020   CATARACT EXTRACTION W/ INTRAOCULAR LENS  IMPLANT, BILATERAL  2009-2010   CESAREAN SECTION  1973; 1978; [place] 04/22/2020   COLONOSCOPY WITH PROPOFOL N/A 10/29/2016   Procedure: COLONOSCOPY WITH PROPOFOL;  Surgeon: Danie Binder, MD;  [facility];  Service: Endoscopy;   Laterality: N/A;  10:00 am   CYSTOSCOPY Left 04/13/2014   Procedure: CYSTOSCOPY FLEXIBLE;  Surgeon: Ardis Hughs, MD;  [facility];  Service: Urology;  Laterality: Left;  with STENT   ESOPHAGOGASTRODUODENOSCOPY (EGD) WITH PROPOFOL N/A 10/29/2016   Procedure: ESOPHAGOGASTRODUODENOSCOPY (EGD) WITH PROPOFOL;  Surgeon: Danie Binder, MD;  [facility];  Service: Endoscopy;  Laterality: N/A;   EYE SURGERY     implant and screws put in the right lower jaw  11/2008   dental surgery   kidney blockage  ?1990's   " major surgery ;put kidney on pump for awhile"   LITHOTRIPSY     "several times"   LUMBAR FUSION  08/2010   MASTECTOMY  1997   right   NEPHROLITHOTOMY Left 04/13/2014   Procedure: LEFT PERCUTANEOUS NEPHROLITHOTOMY WITH SURGEON ACCESS;  Surgeon: Ardis Hughs, MD;  [facility];  Service: Urology;  Laterality: Left;   OVARIAN CYST SURGERY     PARATHYROIDECTOMY  02/26/2012   Procedure: PARATHYROIDECTOMY;  Surgeon: Ascencion Dike, MD;  Location: [facility];  Service: ENT;;   SAVORY DILATION N/A 10/29/2016   Procedure: SAVORY DILATION;  Surgeon: Danie Binder, MD;  [facility];  Service: Endoscopy;  Laterality: N/A;   [place]  2011   urological surgery for blocked ureter secondary to kidney stone       Social History:  The patient  reports that she has never smoked. She has never used smokeless tobacco. She reports current alcohol use. She reports that she does not use drugs.   Family History:  The patient's family history includes Birth defects in her mother; Cancer in her brother, cousin, cousin, and paternal aunt; Colon cancer in her brother; Diabetes in her daughter; Heart attack in her mother; Leukemia in her paternal aunt; Pancreatic cancer in her paternal aunt.    ROS:  Please see the history of present illness. All other systems are reviewed and  Negative to the above problem except as noted.    PHYSICAL EXAM: VS:  BP (!) 144/70   Pulse 84    Ht 5' 6.5" (1.689 m)   Wt 173 lb 9.6 oz (78.7 kg)   SpO2 97%   BMI 27.60 kg/m    Orthostatics:  BP laying 161/93  P 89  Sitting   151/94  P 87  Stanidng   154/91  P 90   Standing 4 min 135/85   P 94      GEN: Well nourished, well developed, in no acute distress  HEENT: normal  Neck: no JVD, carotid bruits,  Cardiac: RRR; no murmurs  No LE  edema  Respiratory:  clear to auscultation bilaterally,  GI: soft, nontender, nondistended, + BS  No hepatomegaly  MS: no deformity Moving all extremities   Skin: warm and dry, no rash Neuro:  Strength and sensation are intact Psych: euthymic mood, full affect   EKG:  EKG is ordered today.  SR 79 bpm   T wave inversion II, III, AVF, V3 to V6    Lipid Panel    Component Value Date/Time   CHOL 201 (H) 09/17/2022 1135   TRIG 104 09/17/2022 1135   HDL 82 09/17/2022 1135   CHOLHDL 2.5 09/17/2022 1135   CHOLHDL 3.4 12/15/2019 1041   VLDL 27 07/03/2016 1203   LDLCALC 101 (H) 09/17/2022 1135   LDLCALC 110 (H) 12/15/2019 1041      Wt Readings from Last 3 Encounters:  10/11/22 173 lb 9.6 oz (78.7 kg)  09/17/22 172 lb 0.6 oz (78 kg)  07/29/22 171 lb (77.6 kg)      ASSESSMENT AND PLAN:  1  Dizziness   THe pt's BP did drop some with standing   SHe was hypertensive initially   I would follow for now   I have encouraged hydration.   Get up slowly   I would not add any toher meds for now   Will set up for testing as well to r/o neuro causes.  2  CP  Atypical    3  Hx Dyspnea   Breathing is stable   Volume status is not bad  4  HL   LDL in Dec 101   SHe is had problems with statins in the past  Has been on bempedoic acid   Follow     5  DM  A1C was 8/6 in Dec 2023    Off of meds   LImit carbs  Follow with PCP     Current medicines are reviewed at length with the patient today.  The patient does not have concerns regarding medicines.  Signed, Dorris Carnes, MD  10/11/2022 11:52 AM    [address],  [place], [place]  [address] Phone: [phone]; Fax: [phone]

## 2022-10-11 NOTE — Progress Notes (Signed)
According to [employer]... pt has not filled her Amlodipine since Sep and would have been out of tablets in December.   Her metoprolol has been filled appropriately and her lasix has not been filled in several months due to being PRN for edema.

## 2022-10-14 ENCOUNTER — Other Ambulatory Visit: Payer: Self-pay | Admitting: Internal Medicine

## 2022-10-14 DIAGNOSIS — I10 Essential (primary) hypertension: Secondary | ICD-10-CM

## 2022-10-17 ENCOUNTER — Ambulatory Visit (HOSPITAL_COMMUNITY)
Admission: RE | Admit: 2022-10-17 | Discharge: 2022-10-17 | Disposition: A | Discharge status: Home / Self Care | Payer: Medicare Other | Source: Ambulatory Visit | Attending: Internal Medicine | Admitting: Internal Medicine

## 2022-10-17 DIAGNOSIS — R55 Syncope and collapse: Secondary | ICD-10-CM | POA: Insufficient documentation

## 2022-10-17 DIAGNOSIS — R296 Repeated falls: Secondary | ICD-10-CM | POA: Insufficient documentation

## 2022-10-24 ENCOUNTER — Ambulatory Visit (HOSPITAL_COMMUNITY)
Admission: RE | Admit: 2022-10-24 | Discharge: 2022-10-24 | Disposition: A | Discharge status: Home / Self Care | Payer: Medicare Other | Source: Ambulatory Visit | Attending: Internal Medicine | Admitting: Internal Medicine

## 2022-10-24 ENCOUNTER — Encounter (HOSPITAL_COMMUNITY): Payer: Self-pay

## 2022-10-24 DIAGNOSIS — R296 Repeated falls: Secondary | ICD-10-CM | POA: Diagnosis present

## 2022-10-24 DIAGNOSIS — R55 Syncope and collapse: Secondary | ICD-10-CM

## 2022-11-21 ENCOUNTER — Encounter: Payer: Self-pay | Admitting: Radiology

## 2022-11-27 ENCOUNTER — Telehealth: Payer: Self-pay | Admitting: Neurology

## 2022-11-27 ENCOUNTER — Encounter: Payer: Self-pay | Admitting: Neurology

## 2022-11-27 ENCOUNTER — Ambulatory Visit: Payer: Medicare Other | Admitting: Neurology

## 2022-11-27 VITALS — BP 160/74 | HR 70 | Ht 66.0 in | Wt 173.0 lb

## 2022-11-27 DIAGNOSIS — R269 Unspecified abnormalities of gait and mobility: Secondary | ICD-10-CM | POA: Diagnosis not present

## 2022-11-27 DIAGNOSIS — W19XXXD Unspecified fall, subsequent encounter: Secondary | ICD-10-CM

## 2022-11-27 NOTE — Progress Notes (Signed)
[facility]  PATIENT: [name] DOB: 1946/07/20  REQUESTING CLINICIAN: Fay Records, MD HISTORY FROM: Patient and daughter  REASON FOR VISIT: Dizziness, multiple falls.    HISTORICAL  CHIEF COMPLAINT:  Chief Complaint  Patient presents with   New Patient (Initial Visit)    Rm 13,  NP internal referral for frequent falls States last fall was 4 weeks ago  BP lying 165 84  P-71 Standing BP- 152/80  P 70    HISTORY OF PRESENT ILLNESS:  This is a 77 year old woman with past medical history of breast cancer, s/p chemotherapy, hypertension, diabetes, who is presenting with multiple falls. Patient reports since May of last year, she has a total of 6 falls resulting in torn ligament, and bruises. She denies any prodromes prior to the fall, no warning signs, no dizziness, no lightheadedness. During the last fall, she was talking to her husband then collapsed to the ground, again no warning signs.  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   OTHER MEDICAL CONDITIONS: History of breast cancer, Hypertension, Diabetes    REVIEW OF SYSTEMS: Full 14 system review of systems performed and negative with exception of: As noted in the HPI   ALLERGIES: Allergies  Allergen Reactions   Demerol [Meperidine] Other (See Comments)    Lost consciousness   Bentyl [Dicyclomine Hcl] Swelling   Invokana [Canagliflozin] Hives   Metformin And Related Diarrhea   Protonix [Pantoprazole Sodium] Swelling   Statins Other (See Comments)    myalgia   Ace Inhibitors Cough    HOME MEDICATIONS: Outpatient Medications Prior to Visit  Medication Sig Dispense Refill   acetaminophen (TYLENOL) 325 MG tablet Take 325 mg by mouth every 6 (six) hours  as needed for mild pain.     amLODipine (NORVASC) 5 MG tablet TAKE ONE TABLET BY MOUTH ONCE DAILY. 90 tablet 0   Blood Glucose Monitoring Suppl (BLOOD GLUCOSE SYSTEM PAK) KIT Use as directed to monitor FSBS 1x daily. Dx: E11.9 1 each 1   Cholecalciferol (VITAMIN D3 PO) Take 1 tablet by mouth daily.     CINNAMON PO Take 1 capsule by mouth. Takes sometimes     diclofenac Sodium (VOLTAREN) 1 % GEL Apply 4 g topically 4 (four) times daily. 4 g 1   famotidine (PEPCID) 20 MG tablet Take 1 tablet (20 mg total) by mouth 2 (two) times daily. 60 tablet 5   furosemide (LASIX) 40 MG tablet Take 1 tablet (40 mg total) by mouth daily as needed (FOR SWELLING). 90 tablet 0   glipiZIDE (GLUCOTROL) 10 MG tablet Take 1 tablet (10 mg total) by mouth 2 (two) times daily before a meal. 90 tablet 2   glucose blood (ACCU-CHEK GUIDE) test strip 1 each by Other route as needed for other. Use as instructed to check blood sugar daily. 100 each 3   LANCETS SUPER THIN 28G MISC Use to check blood sugar once daily. 100 each 3   metFORMIN (GLUCOPHAGE) 500 MG tablet Take 2 tablets (1,000 mg total) by mouth 2 (two) times daily with a meal. (Patient taking differently: Take 500 mg by mouth daily.) 290 tablet 0   metoprolol tartrate (LOPRESSOR) 25 MG  tablet Take 1 tablet (25 mg total) by mouth 2 (two) times daily. 180 tablet 3   Multiple Vitamin (MULTIVITAMIN) tablet Take 1 tablet by mouth daily.     potassium chloride (KLOR-CON) 10 MEQ tablet Take 1 tablet (10 mEq total) by mouth daily. 90 tablet 1   triamcinolone (KENALOG) 0.025 % ointment Apply 1 Application topically 2 (two) times daily. 30 g 0   mirabegron ER (MYRBETRIQ) 25 MG TB24 tablet Take 1 tablet (25 mg total) by mouth daily. (Patient not taking: Reported on 10/11/2022) 90 each 1   pregabalin (LYRICA) 75 MG capsule Take 1 capsule (75 mg total) by mouth at bedtime. (Patient not taking: Reported on 10/11/2022) 90 capsule 0   No facility-administered medications prior to visit.     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   right / chemo + tamoxifen x 5 years    Anemia    Anxiety    Aortic atherosclerosis ([place])    Arthritis    Blood transfusion 1980's   Breast cancer ([place])    Cancer ([place])    Phreesia [id number]   Complication of anesthesia    has a hard time waking up; can't lay flat   Degenerative disc disease, lumbar    pressing on L3 and L4   Diabetes mellitus ([place]) 1989   Type 2 NIDDM; "cancer treatment gave me diabetes"   Diabetes mellitus without complication ([place])    Phreesia 08/18/2020   Diverticulosis    DJD (degenerative joint disease) of lumbar spine    Dysrhythmia    "irregular"   Exertional dyspnea    Gastroparesis    GERD (gastroesophageal reflux disease)    mann   Heart murmur    "think I outgrew it"   Hepatic steatosis    History of kidney stones    History of stomach ulcers    Hx: UTI (urinary tract infection)    Hyperlipidemia    Hypersensitivity    in tongue   Hypertension    IBS (irritable bowel syndrome)    Insomnia    Internal hemorrhoids    Kidney cysts    RT KIDNEY   Kidney stones 2009   s/p lithotripsy   Mild CAD    Mild valvular heart disease    Nephrolithiasis 04/13/2014   Obesity    PFO (patent foramen ovale)    PONV (postoperative nausea and vomiting)    Pulmonary nodule    Shortness of breath    unable to lie flat   Ventral hernia     PAST SURGICAL HISTORY: Past Surgical History:  Procedure Laterality Date   ABDOMINAL HYSTERECTOMY  2002   APPENDECTOMY     BACK SURGERY     Can't take any medical procedure in right arm   BREAST BIOPSY     right   BREAST SURGERY N/A    Phreesia 04/22/2020   CATARACT EXTRACTION W/ INTRAOCULAR LENS  IMPLANT, BILATERAL  2009-2010   CESAREAN SECTION  1973; 1978; [place] 04/22/2020   COLONOSCOPY WITH PROPOFOL N/A 10/29/2016   Procedure: COLONOSCOPY WITH PROPOFOL;  Surgeon: Danie Binder, MD;  [facility];  Service: Endoscopy;  Laterality: N/A;  10:00 am   CYSTOSCOPY Left 04/13/2014   Procedure: CYSTOSCOPY FLEXIBLE;  Surgeon: Ardis Hughs, MD;  [facility];  Service: Urology;  Laterality: Left;  with STENT   ESOPHAGOGASTRODUODENOSCOPY (EGD) WITH PROPOFOL N/A 10/29/2016   Procedure: ESOPHAGOGASTRODUODENOSCOPY (EGD) WITH PROPOFOL;  Surgeon: Danie Binder, MD;  [facility];  Service: Endoscopy;  Laterality: N/A;   EYE SURGERY     implant and screws put in the right lower jaw  11/2008   dental surgery   kidney blockage  ?1990's   " major surgery ;put kidney on pump for awhile"   LITHOTRIPSY     "several times"   LUMBAR FUSION  08/2010   MASTECTOMY  1997   right   NEPHROLITHOTOMY Left 04/13/2014   Procedure: LEFT PERCUTANEOUS NEPHROLITHOTOMY WITH SURGEON ACCESS;  Surgeon: Ardis Hughs, MD;  [facility];  Service: Urology;  Laterality: Left;   OVARIAN CYST SURGERY     PARATHYROIDECTOMY  02/26/2012   Procedure: PARATHYROIDECTOMY;  Surgeon: Ascencion Dike, MD;  Location: [facility];  Service: ENT;;   SAVORY DILATION N/A 10/29/2016   Procedure: SAVORY DILATION;  Surgeon: Danie Binder, MD;  [facility];  Service: Endoscopy;  Laterality: N/A;   [place]  2011   urological surgery for blocked ureter secondary to kidney stone      FAMILY HISTORY: Family History  Problem Relation Age of Onset   Birth defects Mother    Heart attack Mother    Cancer Brother        Esophageal; smoker; deceased 50s   Colon cancer Brother    Diabetes Daughter    Leukemia Paternal Aunt        deceased 68s   Pancreatic cancer Paternal Aunt        deceased 45   Cancer Paternal Aunt        GI cancer; deceased 17s   Cancer Cousin        kidney cancer; deceased 31; pat first cousin; daughter of aunt w/ leukemia   Cancer  Cousin        colon cancer @ 61; pat first cousin; son of aunt with GI cancer   Anesthesia problems Neg Hx     SOCIAL HISTORY: Social History   Socioeconomic History   Marital status: Married    Spouse name: Not on file   Number of children: 3   Years of education: Not on file   Highest education level: Not on file  Occupational History   Occupation: retired in 2011  Tobacco Use   Smoking status: Never   Smokeless tobacco: Never  Vaping Use   Vaping Use: Never used  Substance and Sexual Activity   Alcohol use: Yes    Alcohol/week: 0.0 standard drinks of alcohol    Comment: X2 DRINKS PER YEAR   Drug use: No   Sexual activity: Not Currently    Birth control/protection: Surgical  Other Topics Concern   Not on file  Social History Narrative   Not on file   Social Determinants of Health   Financial Resource Strain: Low Risk  (04/25/2020)   Overall Financial Resource Strain (CARDIA)    Difficulty of Paying Living Expenses: Not very hard  Food Insecurity: Not on file  Transportation Needs: No Transportation Needs (08/23/2021)   PRAPARE - Hydrologist (Medical): No    Lack of Transportation (Non-Medical): No  Physical Activity: Not on file  Stress: Not on file  Social Connections: Not on file  Intimate Partner Violence: Not on file    PHYSICAL EXAM  GENERAL EXAM/CONSTITUTIONAL: Vitals:  Vitals:   11/27/22 0838  BP: (!) 160/74  Pulse: 70  Weight: 173 lb (78.5 kg)  Height: '5\' 6"'$  (1.676 m)   Body mass index is 27.92 kg/m. Wt Readings from Last 3 Encounters:  11/27/22 173 lb (78.5 kg)  10/11/22 173 lb 9.6 oz (78.7 kg)  09/17/22 172 lb 0.6 oz (78 kg)   Patient is in no distress; well developed, nourished and groomed; neck is supple  EYES: Visual fields full to confrontation, Extraocular movements intacts,   MUSCULOSKELETAL: Gait, strength, tone, movements noted in Neurologic exam below  NEUROLOGIC: MENTAL STATUS:      No data  to display         awake, alert, oriented to person, place and time recent and remote memory intact normal attention and concentration language fluent, comprehension intact, naming intact fund of knowledge appropriate  CRANIAL NERVE:  2nd, 3rd, 4th, 6th - Visual fields full to confrontation, extraocular muscles intact, no nystagmus 5th - facial sensation symmetric 7th - facial strength symmetric 8th - hearing intact 9th - palate elevates symmetrically, uvula midline 11th - shoulder shrug symmetric 12th - tongue protrusion midline  MOTOR:  normal bulk and tone, full strength in the BUE, BLE  SENSORY:  normal and symmetric to light touch and pinprick, decrease vibration in the bilateral feet left greater than right.   COORDINATION:  finger-nose-finger, fine finger movements normal  GAIT/STATION:  Antalgic gait, unable to tandem     DIAGNOSTIC DATA (LABS, IMAGING, TESTING) - I reviewed patient records, labs, notes, testing and imaging myself where available.  Lab Results  Component Value Date   WBC 8.5 09/17/2022   HGB 10.9 (L) 09/17/2022   HCT 32.1 (L) 09/17/2022   MCV 77 (L) 09/17/2022   PLT 240 09/17/2022      Component Value Date/Time   NA 140 09/17/2022 1135   K 3.9 09/17/2022 1135   CL 101 09/17/2022 1135   CO2 23 09/17/2022 1135   GLUCOSE 186 (H) 09/17/2022 1135   GLUCOSE 221 (H) 02/11/2022 0756   GLUCOSE 219 (H) 08/26/2016 1251   BUN 12 09/17/2022 1135   CREATININE 1.16 (H) 09/17/2022 1135   CREATININE 1.07 (H) 10/25/2020 1224   CALCIUM 9.1 09/17/2022 1135   CALCIUM 9.9 02/26/2012 1551   PROT 6.8 09/17/2022 1135   ALBUMIN 3.8 09/17/2022 1135   AST 18 09/17/2022 1135   ALT 12 09/17/2022 1135   ALKPHOS 67 09/17/2022 1135   BILITOT 0.4 09/17/2022 1135   GFRNONAA 59 (L) 02/11/2022 0756   GFRNONAA 68 09/07/2013 1104   GFRAA >60 11/20/2018 1229   GFRAA 78 09/07/2013 1104   Lab Results  Component Value Date   CHOL 201 (H) 09/17/2022   HDL 82  09/17/2022   LDLCALC 101 (H) 09/17/2022   TRIG 104 09/17/2022   CHOLHDL 2.5 09/17/2022   Lab Results  Component Value Date   HGBA1C 8.6 (H) 09/17/2022   Lab Results  Component Value Date   VITAMINB12 248 02/11/2022   Lab Results  Component Value Date   TSH 0.844 09/17/2022    MRI Brain 2022 No acute or reversible finding. No specific cause of the presenting symptoms is identified. Mild to moderate chronic small-vessel ischemic changes of the pons and cerebral hemispheric white matter. Old minor cortical infarction at the left posterior frontal vertex region.  CT Head 10/24/2022 Periventricular white matter changes consistent with chronic small vessel ischemia. Small chronic left frontal CVA. No acute intracranial process identified.  ASSESSMENT AND PLAN  77 y.o. year old female with history of breast cancer, s/p chemotherapy, hypertension, diabetes, who is presenting with multiple falls.  Denies any mechanical fall, denies any trip and fall and also no warning signs before the falls.  She does have evidence of peripheral neuropathy on exam but not enough to cause a fall.  Her recent CT scan and MRI brain were reviewed, again no etiology of the falls, she had a chronic left frontal stroke but this is not causing her falls.  At the moment I am going to refer to physical therapy for gait training.  Advised her to continue current medications return in 63-month for follow-up or sooner if worse.  She voiced understanding.   1. Gait abnormality   2. Fall, subsequent encounter      Patient Instructions  Continue current medication Referral to physical therapy for gait training Follow-up in 6 months or sooner if worse.   Orders Placed This Encounter  Procedures   Ambulatory referral to Physical Therapy    No orders of the defined types were placed in this encounter.   Return in about 6 months (around 05/30/2023).    AAlric Ran MD 11/27/2022, 12:58 PM  [facility] [address] [place] [phone]

## 2022-11-27 NOTE — Patient Instructions (Addendum)
Continue current medication Referral to physical therapy for gait training Follow-up in 6 months or sooner if worse.

## 2022-11-27 NOTE — Telephone Encounter (Signed)
Referral for physical therapy sent through [facility] to [facility]. Phone: [phone]

## 2022-12-09 ENCOUNTER — Ambulatory Visit: Payer: Medicare Other | Attending: Neurology | Admitting: Physical Therapy

## 2022-12-09 ENCOUNTER — Encounter: Payer: Self-pay | Admitting: Physical Therapy

## 2022-12-09 DIAGNOSIS — R2689 Other abnormalities of gait and mobility: Secondary | ICD-10-CM | POA: Insufficient documentation

## 2022-12-09 DIAGNOSIS — R2681 Unsteadiness on feet: Secondary | ICD-10-CM | POA: Diagnosis not present

## 2022-12-09 DIAGNOSIS — W19XXXD Unspecified fall, subsequent encounter: Secondary | ICD-10-CM | POA: Insufficient documentation

## 2022-12-09 DIAGNOSIS — M6281 Muscle weakness (generalized): Secondary | ICD-10-CM | POA: Insufficient documentation

## 2022-12-09 NOTE — Therapy (Unsigned)
OUTPATIENT PHYSICAL THERAPY NEURO EVALUATION   Patient Name: [name] MRN: [id number] DOB:[birth year], 77 y.o., female Today's Date: 12/10/2022   PCP: Lindell Spar, MD REFERRING PROVIDER: Alric Ran, MD  END OF SESSION:  PT End of Session - 12/10/22 1941     Visit Number 1    Number of Visits 17    Date for PT Re-Evaluation 02/07/23    Authorization Type UHC Medicare    Authorization Time Period 12-09-22 - 02-21-23    PT Start Time 1017    PT Stop Time 1101    PT Time Calculation (min) 44 min    Equipment Utilized During Treatment Gait belt    Activity Tolerance Patient tolerated treatment well    Behavior During Therapy WFL for tasks assessed/performed             Past Medical History:  Diagnosis Date   (HFpEF) heart failure with preserved ejection fraction ([place])    Abdominal hernia 02/26/2012   unrepaired   Adenocarcinoma of breast ([place]) 1997   right / chemo + tamoxifen x 5 years    Anemia    Anxiety    Aortic atherosclerosis ([place])    Arthritis    Blood transfusion 1980's   Breast cancer ([place])    Cancer ([place])    Phreesia [id number]   Complication of anesthesia    has a hard time waking up; can't lay flat   Degenerative disc disease, lumbar    pressing on L3 and L4   Diabetes mellitus ([place]) 1989   Type 2 NIDDM; "cancer treatment gave me diabetes"   Diabetes mellitus without complication ([place])    Phreesia 08/18/2020   Diverticulosis    DJD (degenerative joint disease) of lumbar spine    Dysrhythmia    "irregular"   Exertional dyspnea    Gastroparesis    GERD (gastroesophageal reflux disease)    mann   Heart murmur    "think I outgrew it"   Hepatic steatosis    History of kidney stones    History of stomach ulcers    Hx: UTI (urinary tract infection)    Hyperlipidemia    Hypersensitivity    in tongue   Hypertension    IBS (irritable bowel syndrome)    Insomnia    Internal hemorrhoids    Kidney cysts    RT KIDNEY   Kidney stones  2009   s/p lithotripsy   Mild CAD    Mild valvular heart disease    Nephrolithiasis 04/13/2014   Obesity    PFO (patent foramen ovale)    PONV (postoperative nausea and vomiting)    Pulmonary nodule    Shortness of breath    unable to lie flat   Ventral hernia    Past Surgical History:  Procedure Laterality Date   ABDOMINAL HYSTERECTOMY  2002   APPENDECTOMY     BACK SURGERY     Can't take any medical procedure in right arm   BREAST BIOPSY     right   BREAST SURGERY N/A    Phreesia 04/22/2020   CATARACT EXTRACTION W/ INTRAOCULAR LENS  IMPLANT, BILATERAL  2009-2010   CESAREAN SECTION  1973; 1978; [place] 04/22/2020   COLONOSCOPY WITH PROPOFOL N/A 10/29/2016   Procedure: COLONOSCOPY WITH PROPOFOL;  Surgeon: Danie Binder, MD;  [facility];  Service: Endoscopy;  Laterality: N/A;  10:00 am   CYSTOSCOPY Left 04/13/2014   Procedure: CYSTOSCOPY FLEXIBLE;  Surgeon: Ardis Hughs, MD;  [facility];  Service: Urology;  Laterality: Left;  with STENT   ESOPHAGOGASTRODUODENOSCOPY (EGD) WITH PROPOFOL N/A 10/29/2016   Procedure: ESOPHAGOGASTRODUODENOSCOPY (EGD) WITH PROPOFOL;  Surgeon: Danie Binder, MD;  [facility];  Service: Endoscopy;  Laterality: N/A;   EYE SURGERY     implant and screws put in the right lower jaw  11/2008   dental surgery   kidney blockage  ?1990's   " major surgery ;put kidney on pump for awhile"   LITHOTRIPSY     "several times"   LUMBAR FUSION  08/2010   MASTECTOMY  1997   right   NEPHROLITHOTOMY Left 04/13/2014   Procedure: LEFT PERCUTANEOUS NEPHROLITHOTOMY WITH SURGEON ACCESS;  Surgeon: Ardis Hughs, MD;  [facility];  Service: Urology;  Laterality: Left;   OVARIAN CYST SURGERY     PARATHYROIDECTOMY  02/26/2012   Procedure: PARATHYROIDECTOMY;  Surgeon: Ascencion Dike, MD;  Location: [facility];  Service: ENT;;   SAVORY DILATION N/A 10/29/2016   Procedure: SAVORY DILATION;  Surgeon: Danie Binder,  MD;  [facility];  Service: Endoscopy;  Laterality: N/A;   [place]  2011   urological surgery for blocked ureter secondary to kidney stone     Patient Active Problem List   Diagnosis Date Noted   Peripheral neuropathy 09/17/2022   Right foot pain 05/02/2022   Hypokalemia 05/02/2022   Overactive bladder 02/08/2022   Left knee pain 02/08/2022   Right leg swelling 02/08/2022   Encounter for general adult medical examination with abnormal findings 08/30/2021   Chronic fatigue 08/30/2021   Vitamin B12 deficiency 08/30/2021   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   Balance problem 12/26/2020   Thyroid nodule 01/16/2018   Overweight (BMI 25.0-29.9) 12/18/2017   Diabetic neuropathy ([place]) 10/13/2017   Genetic testing 11/28/2016   Esophageal dysphagia 09/30/2016   Ventral hernia 07/28/2015   Situational anxiety 04/26/2015   Thyromegaly 01/23/2015   Iron deficiency 06/17/2014   DDD (degenerative disc disease), lumbar 01/26/2014   Bulge of lumbar disc without myelopathy 01/26/2014   Renal cyst 01/26/2014   Back pain with left-sided sciatica 12/28/2013   Uncontrolled type 2 diabetes mellitus with hyperglycemia, without long-term current use of insulin ([place]) 08/01/2013   GASTROPARESIS 12/15/2008   GERD (gastroesophageal reflux disease) 06/21/2008   IBS (irritable bowel syndrome) 06/21/2008   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 ([place]) 02/09/2008   Hyperlipidemia 02/09/2008   Essential hypertension 02/09/2008   Insomnia 02/09/2008    ONSET DATE: May 2023:  Referral date 11-27-22  REFERRING DIAG: R26.9 (ICD-10-CM) - Gait abnormality W19.XXXD (ICD-10-CM) - Fall, subsequent encounter  THERAPY DIAG:  Other abnormalities of gait and mobility - Plan: PT plan of care cert/re-cert  Muscle weakness (generalized) - Plan: PT plan of care cert/re-cert  Unsteadiness on feet - Plan: PT plan of care cert/re-cert  Rationale for Evaluation and Treatment: Rehabilitation  SUBJECTIVE:  SUBJECTIVE STATEMENT: Pt presents to PT eval with hx of sudden falls of unknown etiology:  reports she has some dizziness and some off balance; pt states she had 1st fall on 02-03-22; has had 5 falls since then with most recent fall about a month ago; pt using SPC with base  Pt accompanied by: self  PERTINENT HISTORY: This is a 77 year old woman with past medical history of breast cancer, s/p chemotherapy, hypertension, diabetes, who is presenting with multiple falls. Patient reports since May of last year, she has a total of 6 falls resulting in torn ligament, and bruises. She denies any prodromes prior to the fall, no warning signs, no dizziness, no lightheadedness  PAIN:  Are you having pain? Yes: NPRS scale: 10/10 Pain location: fingers and toes - burning pain due to peripheral neuropathy Aggravating factors: no specific factors Relieving factors: ice and Advil  PRECAUTIONS: Fall  WEIGHT BEARING RESTRICTIONS: No  FALLS: Has patient fallen in last 6 months? Yes. Number of falls approx. 3   LIVING ENVIRONMENT: Lives with: lives with their spouse and lives with their daughter Lives in: House/apartment Stairs: Yes: Internal: 12 steps; on right going up and External: 1 steps; none pt does not have to go up to 2nd level Has following equipment at home: Single point cane and Walker - 2 wheeled  PLOF: Independent  PATIENT GOALS: improve walking and leg strength  OBJECTIVE:   DIAGNOSTIC FINDINGS: FINDINGS: Brain: There is periventricular white matter decreased attenuation consistent with small vessel ischemic changes. Gray-white differentiation is preserved. No acute intracranial hemorrhage, mass effect or shift. No hydrocephalus. 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  COGNITION: Overall cognitive status: Within functional limits for tasks assessed   SENSATION: Light touch: Impaired  due to peripheral neuropathy  COORDINATION: WFL's  POSTURE: No Significant postural limitations  LOWER EXTREMITY ROM:  WFL's bil. LE's    LOWER EXTREMITY MMT:    MMT Right Eval Left Eval  Hip flexion 3+ 3+  Hip extension 3+ 3+  Hip abduction 4- 3+  Hip adduction    Hip internal rotation    Hip external rotation    Knee flexion    Knee extension 5 5  Ankle dorsiflexion 5 3+  Ankle plantarflexion    Ankle inversion    Ankle eversion    (Blank rows = not tested)  BED MOBILITY:  Independent   TRANSFERS: Assistive device utilized: Single point cane  Sit to stand: Modified independence Stand to sit: Modified independence   GAIT: Gait pattern:  decreased push off due to gastroc weakness LLE and step through pattern Distance walked: 46' Assistive device utilized: Single point cane Level of assistance: SBA Comments: pt would benefit from use of RW  FUNCTIONAL TESTS:  5 times sit to stand: 28.06 with RUE support from standard chair Timed up and go (TUG): 22.31 secs with SPC  PATIENT SURVEYS:  N/A  TODAY'S TREATMENT:  DATE: eval only - recommended use of RW rather than [facility]    PATIENT EDUCATION: Education details: eval results and use of RW for safety Person educated: Patient Education method: Explanation Education comprehension: verbalized understanding  HOME EXERCISE PROGRAM: To be established  GOALS: Goals reviewed with patient? Yes   SHORT TERM GOALS: Target date: 01-10-23  Improve 5x sit to stand score to </= 23 secs with RUE support from standard chair. Baseline:  28.06 secs Goal status: INITIAL  2.  Amb. 250' with RW with SBA for increased community accessibility.  Baseline:  Goal status:  INITIAL  3. Improve gait velocity by at least .3 ft/sec with use of SPC for increased gait efficiency. Baseline:  Goal status: INITIAL  4.  Improve TUG score to </= 19 secs with SPC for reduced fall risk. Baseline: 22,31 secs with SPC Goal status: INITIAL  5.  Independent in HEP for bil. LE strengthening and balance exercises.  Baseline:  Goal status: INITIAL   LONG TERM GOALS: Target date: 02-07-23  Improve 5x sit to stand score to </= 20 secs with RUE support from standard chair. Baseline:  28.06 secs Goal status: INITIAL  2.  Amb. 460' with RW with SBA for increased community accessibility.  Baseline:  Goal status: INITIAL  3.   Improve gait velocity by at least .6 ft/sec with use of SPC for increased gait efficiency. Baseline:  Goal status: INITIAL  4.   Improve TUG score to </= 15 secs with SPC for reduced fall risk. Baseline: 22.31 secs Goal status: INITIAL  5.  Independent in updated HEP for balance and strengthening. Baseline:  Goal status: INITIAL    ASSESSMENT:  CLINICAL IMPRESSION: Patient is a 77 y.o. lady who was seen today for physical therapy evaluation and treatment for gait abnormality and recurrent falls of unknown etiology.  Pt presents with bil. Hip musc. And ankle weakness with LLE weaker than RLE.  Pt's TUG score is indicative of fall risk with score 22.31 secs with SPC.  Pt would benefit from use of RW to increase safety and stability with ambulation.  Pt has decreased standing balance with unsteadiness upon initial standing, with need for UE support for safety.  Pt will benefit from PT to address balance, strength, and gait deficits.     OBJECTIVE IMPAIRMENTS: Abnormal gait, decreased activity tolerance, decreased balance, decreased strength, dizziness, impaired sensation, and pain.   ACTIVITY LIMITATIONS: carrying, bending, standing, squatting, stairs, and locomotion level  PARTICIPATION LIMITATIONS: meal prep, cleaning, laundry, driving,  shopping, and community activity  PERSONAL FACTORS: Past/current experiences, Time since onset of injury/illness/exacerbation, and unknown etiology of falls  are also affecting patient's functional outcome.   REHAB POTENTIAL: Good  CLINICAL DECISION MAKING: Evolving/moderate complexity  EVALUATION COMPLEXITY: Moderate  PLAN:  PT FREQUENCY: 2x/week  PT DURATION: 8 weeks  PLANNED INTERVENTIONS: Therapeutic exercises, Therapeutic activity, Neuromuscular re-education, Balance training, Gait training, Patient/Family education, Self Care, Stair training, Vestibular training, DME instructions, and Aquatic Therapy  PLAN FOR NEXT SESSION: do orthostatic assessment, gait velocity, issue HEP, gait train with RW   Alda Lea, PT 12/10/2022, 9:04 PM

## 2022-12-16 ENCOUNTER — Ambulatory Visit: Payer: Medicare Other | Admitting: Physical Therapy

## 2022-12-17 ENCOUNTER — Ambulatory Visit: Payer: Medicare Other | Admitting: Podiatry

## 2022-12-17 ENCOUNTER — Ambulatory Visit: Payer: Medicare Other | Admitting: Physical Therapy

## 2022-12-17 DIAGNOSIS — M7751 Other enthesopathy of right foot: Secondary | ICD-10-CM

## 2022-12-17 DIAGNOSIS — R2681 Unsteadiness on feet: Secondary | ICD-10-CM | POA: Diagnosis not present

## 2022-12-17 DIAGNOSIS — R2689 Other abnormalities of gait and mobility: Secondary | ICD-10-CM | POA: Diagnosis not present

## 2022-12-17 DIAGNOSIS — E1149 Type 2 diabetes mellitus with other diabetic neurological complication: Secondary | ICD-10-CM

## 2022-12-17 DIAGNOSIS — M6281 Muscle weakness (generalized): Secondary | ICD-10-CM

## 2022-12-17 NOTE — Progress Notes (Unsigned)
Subjective: Chief Complaint  Patient presents with   Follow-up    Bilateral feet, neuropathy, patient stated that the healing is slow but its getting there     77 year old female presents the above concerns. She states she is still getting senstaion in her feet/hands. She is getting pain in both ankles. She had an injury to the right ankle in May 2023. She staes she doesn't want to end up in a wheelchair.    Objective: AAO x3, NAD DP/PT pulses palpable bilaterally, CRT less than 3 seconds Still some darkened discoloration on the spots on the top of the feet from the previous insect bites.  There is no pain associated this is no open lesions, swelling or redness or any drainage. Unable to elicit any area of tenderness to bilateral ankles on today's exam is no edema.  Flexor, extensor tendons appear to be intact.  There is no sign.  MMT 5/5. No pain with calf compression, swelling, warmth, erythema  Assessment: 77 year old female with neuropathy, discoloration resolved ankle pain.  Plan: -All treatment options discussed with the patient including all alternatives, risks, complications.  -send note to Pt  -Patient encouraged to call the office with any questions, concerns, change in symptoms.   Trula Slade DPM

## 2022-12-17 NOTE — Therapy (Signed)
OUTPATIENT PHYSICAL THERAPY NEURO TREATMENT NOTE   Patient Name: MARLINDA Brown MRN: [id number] DOB:11-May-1946, 77 y.o., female Today's Date: 12/18/2022   PCP: Lindell Spar, MD REFERRING PROVIDER: Alric Ran, MD  END OF SESSION:  PT End of Session - 12/18/22 1512     Visit Number 2    Number of Visits 17    Date for PT Re-Evaluation 02/07/23    Authorization Type UHC Medicare    Authorization Time Period 12-09-22 - 02-21-23    PT Start Time 0846    PT Stop Time 0925    PT Time Calculation (min) 39 min    Activity Tolerance Patient tolerated treatment well    Behavior During Therapy [facility] for tasks assessed/performed              Past Medical History:  Diagnosis Date   (HFpEF) heart failure with preserved ejection fraction ([place])    Abdominal hernia 02/26/2012   unrepaired   Adenocarcinoma of breast ([place]) 1997   right / chemo + tamoxifen x 5 years    Anemia    Anxiety    Aortic atherosclerosis ([place])    Arthritis    Blood transfusion 1980's   Breast cancer ([place])    Cancer ([place])    Phreesia [id number]   Complication of anesthesia    has a hard time waking up; can't lay flat   Degenerative disc disease, lumbar    pressing on L3 and L4   Diabetes mellitus ([place]) 1989   Type 2 NIDDM; "cancer treatment gave me diabetes"   Diabetes mellitus without complication ([place])    Phreesia 08/18/2020   Diverticulosis    DJD (degenerative joint disease) of lumbar spine    Dysrhythmia    "irregular"   Exertional dyspnea    Gastroparesis    GERD (gastroesophageal reflux disease)    mann   Heart murmur    "think I outgrew it"   Hepatic steatosis    History of kidney stones    History of stomach ulcers    Hx: UTI (urinary tract infection)    Hyperlipidemia    Hypersensitivity    in tongue   Hypertension    IBS (irritable bowel syndrome)    Insomnia    Internal hemorrhoids    Kidney cysts    RT KIDNEY   Kidney stones 2009   s/p lithotripsy   Mild CAD    Mild  valvular heart disease    Nephrolithiasis 04/13/2014   Obesity    PFO (patent foramen ovale)    PONV (postoperative nausea and vomiting)    Pulmonary nodule    Shortness of breath    unable to lie flat   Ventral hernia    Past Surgical History:  Procedure Laterality Date   ABDOMINAL HYSTERECTOMY  2002   APPENDECTOMY     BACK SURGERY     Can't take any medical procedure in right arm   BREAST BIOPSY     right   BREAST SURGERY N/A    Phreesia 04/22/2020   CATARACT EXTRACTION W/ INTRAOCULAR LENS  IMPLANT, BILATERAL  2009-2010   CESAREAN SECTION  1973; 1978; [place] 04/22/2020   COLONOSCOPY WITH PROPOFOL N/A 10/29/2016   Procedure: COLONOSCOPY WITH PROPOFOL;  Surgeon: Danie Binder, MD;  [facility];  Service: Endoscopy;  Laterality: N/A;  10:00 am   CYSTOSCOPY Left 04/13/2014   Procedure: CYSTOSCOPY FLEXIBLE;  Surgeon: Ardis Hughs, MD;  [facility];  Service: Urology;  Laterality: Left;  with STENT   ESOPHAGOGASTRODUODENOSCOPY (EGD) WITH PROPOFOL N/A 10/29/2016   Procedure: ESOPHAGOGASTRODUODENOSCOPY (EGD) WITH PROPOFOL;  Surgeon: Danie Binder, MD;  [facility];  Service: Endoscopy;  Laterality: N/A;   EYE SURGERY     implant and screws put in the right lower jaw  11/2008   dental surgery   kidney blockage  ?1990's   " major surgery ;put kidney on pump for awhile"   LITHOTRIPSY     "several times"   LUMBAR FUSION  08/2010   MASTECTOMY  1997   right   NEPHROLITHOTOMY Left 04/13/2014   Procedure: LEFT PERCUTANEOUS NEPHROLITHOTOMY WITH SURGEON ACCESS;  Surgeon: Ardis Hughs, MD;  [facility];  Service: Urology;  Laterality: Left;   OVARIAN CYST SURGERY     PARATHYROIDECTOMY  02/26/2012   Procedure: PARATHYROIDECTOMY;  Surgeon: Ascencion Dike, MD;  Location: [employer];  Service: ENT;;   SAVORY DILATION N/A 10/29/2016   Procedure: SAVORY DILATION;  Surgeon: Danie Binder, MD;  [facility];  Service:  Endoscopy;  Laterality: N/A;   [place]  2011   urological surgery for blocked ureter secondary to kidney stone     Patient Active Problem List   Diagnosis Date Noted   Peripheral neuropathy 09/17/2022   Right foot pain 05/02/2022   Hypokalemia 05/02/2022   Overactive bladder 02/08/2022   Left knee pain 02/08/2022   Right leg swelling 02/08/2022   Encounter for general adult medical examination with abnormal findings 08/30/2021   Chronic fatigue 08/30/2021   Vitamin B12 deficiency 08/30/2021   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   Balance problem 12/26/2020   Thyroid nodule 01/16/2018   Overweight (BMI 25.0-29.9) 12/18/2017   Diabetic neuropathy ([place]) 10/13/2017   Genetic testing 11/28/2016   Esophageal dysphagia 09/30/2016   Ventral hernia 07/28/2015   Situational anxiety 04/26/2015   Thyromegaly 01/23/2015   Iron deficiency 06/17/2014   DDD (degenerative disc disease), lumbar 01/26/2014   Bulge of lumbar disc without myelopathy 01/26/2014   Renal cyst 01/26/2014   Back pain with left-sided sciatica 12/28/2013   Uncontrolled type 2 diabetes mellitus with hyperglycemia, without long-term current use of insulin ([place]) 08/01/2013   GASTROPARESIS 12/15/2008   GERD (gastroesophageal reflux disease) 06/21/2008   IBS (irritable bowel syndrome) 06/21/2008   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 ([place]) 02/09/2008   Hyperlipidemia 02/09/2008   Essential hypertension 02/09/2008   Insomnia 02/09/2008    ONSET DATE: May 2023:  Referral date 11-27-22  REFERRING DIAG: R26.9 (ICD-10-CM) - Gait abnormality W19.XXXD (ICD-10-CM) - Fall, subsequent encounter  THERAPY DIAG:  Muscle weakness (generalized)  Rationale for Evaluation and Treatment: Rehabilitation  SUBJECTIVE:  SUBJECTIVE STATEMENT: Pt  reports she has appt with her foot doctor at 9:45 today after PT appt. - pt reports no changes since initial PT eval last week Pt accompanied by: self  PERTINENT HISTORY: This is a 77 year old woman with past medical history of breast cancer, s/p chemotherapy, hypertension, diabetes, who is presenting with multiple falls. Patient reports since May of last year, she has a total of 6 falls resulting in torn ligament, and bruises. She denies any prodromes prior to the fall, no warning signs, no dizziness, no lightheadedness  PAIN:  Are you having pain? Yes: NPRS scale: 10/10 Pain location: fingers and toes - burning pain due to peripheral neuropathy Aggravating factors: no specific factors Relieving factors: ice and Advil  PRECAUTIONS: Fall  WEIGHT BEARING RESTRICTIONS: No  FALLS: Has patient fallen in last 6 months? Yes. Number of falls approx. 3   LIVING ENVIRONMENT: Lives with: lives with their spouse and lives with their daughter Lives in: House/apartment Stairs: Yes: Internal: 12 steps; on right going up and External: 1 steps; none pt does not have to go up to 2nd level Has following equipment at home: Single point cane and Walker - 2 wheeled  PLOF: Independent  PATIENT GOALS: improve walking and leg strength  OBJECTIVE:    GAIT: Gait pattern:  decreased push off due to gastroc weakness LLE and step through pattern Distance walked: 63' Assistive device utilized: Single point cane Level of assistance: SBA Comments: pt would benefit from use of RW   Pt instructed in HEP for bil. LE strengthening - [place]; pt performed exercises listed below 10 reps each leg; resistance used was red theraband  Access Code: [email or web address] URL: [email or web address] Date: 12/18/2022 Prepared by: Ethelene Browns  Exercises - Supine Bridge  - 1 x daily - 7 x weekly - 1 sets - 10 reps - 5 sec hold - Single Leg Bridge  - 1 x daily - 7 x weekly - 1 sets - 10 reps - 3 sec  hold - Sidelying Hip Abduction  - 1 x daily - 7 x weekly - 3 sets - 10 reps - Clamshell  - 1 x daily - 7 x weekly - 1 sets - 10 reps - Supine Hip Flexion  - 1 x daily - 7 x weekly - 1 sets - 10 reps - 3 sec hold - Hooklying Clamshell with Resistance  - 1 x daily - 7 x weekly - 1 sets - 10 reps - Standing Hip Extension with Resistance at Ankles and Counter Support  - 1 x daily - 7 x weekly - 1 sets - 10 reps - 3 sec hold hold - Sit to Stand  - 1 x daily - 7 x weekly - 1 sets - 10 reps  SciFit exercise - level 2.5 x 5" with Ue's and LE's for strengthening    PATIENT EDUCATION: Education details: Medbridge HEP  Person educated: Patient Education method: Explanation Education comprehension: verbalized understanding  HOME EXERCISE PROGRAM: To be established  GOALS: Goals reviewed with patient? Yes   SHORT TERM GOALS: Target date: 01-10-23  Improve 5x sit to stand score to </= 23 secs with RUE support from standard chair. Baseline:  28.06 secs Goal status: INITIAL  2.  Amb. 250' with RW with SBA for increased community accessibility.  Baseline:  Goal status: INITIAL  3. Improve gait velocity by at least .3 ft/sec with use of SPC for increased gait efficiency. Baseline:  Goal status: INITIAL  4.  Improve TUG score to </= 19 secs with SPC for reduced fall risk. Baseline: 22,31 secs with SPC Goal status: INITIAL  5.  Independent in HEP for bil. LE strengthening and balance exercises.  Baseline:  Goal status: INITIAL   LONG TERM GOALS: Target date: 02-07-23  Improve 5x sit to stand score to </= 20 secs with RUE support from standard chair. Baseline:  28.06 secs Goal status: INITIAL  2.  Amb. 460' with RW with SBA for increased community accessibility.  Baseline:  Goal status: INITIAL  3.   Improve gait velocity by at least .6 ft/sec with use of SPC for increased gait efficiency. Baseline:  Goal status: INITIAL  4.   Improve TUG score to </= 15 secs with SPC for  reduced fall risk. Baseline: 22.31 secs Goal status: INITIAL  5.  Independent in updated HEP for balance and strengthening. Baseline:  Goal status: INITIAL    ASSESSMENT:  CLINICAL IMPRESSION: PT session focused on establishing HEP for bil. LE strengthening.  Pt's LLE is slightly weaker than RLE.  Red theraband used for resistance for PRE's.  Pt tolerated exercises well.  Cont with POC.     OBJECTIVE IMPAIRMENTS: Abnormal gait, decreased activity tolerance, decreased balance, decreased strength, dizziness, impaired sensation, and pain.   ACTIVITY LIMITATIONS: carrying, bending, standing, squatting, stairs, and locomotion level  PARTICIPATION LIMITATIONS: meal prep, cleaning, laundry, driving, shopping, and community activity  PERSONAL FACTORS: Past/current experiences, Time since onset of injury/illness/exacerbation, and unknown etiology of falls  are also affecting patient's functional outcome.   REHAB POTENTIAL: Good  CLINICAL DECISION MAKING: Evolving/moderate complexity  EVALUATION COMPLEXITY: Moderate  PLAN:  PT FREQUENCY: 2x/week  PT DURATION: 8 weeks  PLANNED INTERVENTIONS: Therapeutic exercises, Therapeutic activity, Neuromuscular re-education, Balance training, Gait training, Patient/Family education, Self Care, Stair training, Vestibular training, DME instructions, and Aquatic Therapy  PLAN FOR NEXT SESSION: do orthostatic assessment, gait velocity, issue HEP, gait train with RW   Ariana Cavenaugh, [place], PT 12/18/2022, 3:15 PM

## 2022-12-18 ENCOUNTER — Encounter: Payer: Self-pay | Admitting: Physical Therapy

## 2022-12-19 ENCOUNTER — Ambulatory Visit: Payer: Medicare Other | Admitting: Internal Medicine

## 2022-12-19 ENCOUNTER — Encounter: Payer: Self-pay | Admitting: Internal Medicine

## 2022-12-19 VITALS — BP 125/76 | HR 81 | Ht 66.0 in | Wt 176.2 lb

## 2022-12-19 DIAGNOSIS — R2689 Other abnormalities of gait and mobility: Secondary | ICD-10-CM

## 2022-12-19 DIAGNOSIS — I10 Essential (primary) hypertension: Secondary | ICD-10-CM

## 2022-12-19 DIAGNOSIS — E1143 Type 2 diabetes mellitus with diabetic autonomic (poly)neuropathy: Secondary | ICD-10-CM | POA: Diagnosis not present

## 2022-12-19 DIAGNOSIS — E1165 Type 2 diabetes mellitus with hyperglycemia: Secondary | ICD-10-CM | POA: Diagnosis not present

## 2022-12-19 DIAGNOSIS — R269 Unspecified abnormalities of gait and mobility: Secondary | ICD-10-CM | POA: Insufficient documentation

## 2022-12-19 MED ORDER — DULOXETINE HCL 30 MG PO CPEP: 30.0000 mg | ORAL_CAPSULE | Freq: Every day | ORAL | 2 refills | Status: DC

## 2022-12-19 NOTE — Assessment & Plan Note (Signed)
Did not tolerate gabapentin or Lyrica Unclear if her gait disturbance is due to diabetic neuropathy She has chronic burning pain and tingling in the UE and LE Started Cymbalta 30 mg QD Followed by neurology

## 2022-12-19 NOTE — Assessment & Plan Note (Signed)
BP Readings from Last 1 Encounters:  12/19/22 125/76   Well controlled with amlodipine and metoprolol Counseled for compliance with the medications Advised DASH diet and moderate exercise/walking, at least 150 mins/week

## 2022-12-19 NOTE — Patient Instructions (Signed)
Please start taking Duloxetine as prescribed.  Please continue to take other medications as prescribed.  Please continue to follow low carb diet and ambulate as tolerated.

## 2022-12-19 NOTE — Assessment & Plan Note (Addendum)
Has led to frequent falls Unclear etiology, diabetic neuropathy could be a risk factor Has had neurology evaluation, has been referred for PT Had CT head, which showed chronic small vessel ischemia and small chronic left frontal CVA, although it is less likely to cause her current symptoms  Check MRI of brain to rule out demyelinating lesion or ventriculomegaly or other structural issue

## 2022-12-19 NOTE — Assessment & Plan Note (Signed)
Has recurrent falls without prodromal symptoms Reports leg weakness and numbness Uses cane now Continue PT

## 2022-12-19 NOTE — Assessment & Plan Note (Addendum)
Lab Results  Component Value Date   HGBA1C 8.6 (H) 09/17/2022   Uncontrolled On Metformin 500 mg QD and Glipizide 10 mg BID Did not tolerate Jardiance, Januvia and GLP-1 agonist in the past Needs to follow diabetic diet F/u CMP and lipid panel Diabetic eye exam: Advised to follow up with Ophthalmology for diabetic eye exam

## 2022-12-19 NOTE — Progress Notes (Signed)
Established Patient Office Visit  Subjective:  Patient ID: [name], female    DOB: October 19, 1945  Age: 77 y.o. MRN: [id number]  CC:  Chief Complaint  Patient presents with   Hypertension    Three month follow up. Patient stays dizzy and off balance. She has neuropathy in her fingers and feet that is painful. She has had six falls within a year.    HPI [name] is a 77 y.o. female with past medical history of HTN, DM, breast ca s/p right mastectomy and chemotherapy, HLD, DDD of lumbar spine and GERD who presents for f/u of her chronic medical conditions.  Her BP was wnl today.  She has been taking amlodipine and metoprolol now.  She denies any headache, chest pain, dyspnea or palpitations.    She has chronic dizziness and gait disturbance.  She reports multiple falls in the last year.  She had CT of head, which showed -chronic small vessel ischemia and small chronic left frontal CVA. She has had neurology evaluation for gait disturbance and was referred to PT.  She states that she has not been seen much improvement in her gait.  She was given gabapentin and later Lyrica for neuropathy, but she had drowsiness with both.  Of note, she has history of uncontrolled type II DM and has not been able to tolerate most of the non-insulin medicines except glipizide.  She does not check her blood glucose at home, but denies skipping any meals.  She takes Metformin 500 mg QD and glipizide 10 mg BID for DM. Her HbA1C was 8.6 in 12/23. She denies polyuria or polydipsia. She did not tolerate Januvia, Jardiance and Trulicity in the past. She did not tolerate statin in the past and has tried taking Nexletol, sent by lipid clinic.  She complains of chronic fatigue and also reports having neuropathic pain in her legs and feet, which have been chronic.  She was offered treatment for DM neuropathy in the past, but she refused it.  She agrees to start Duloxetine for now.  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[place]) 1997   right / chemo + tamoxifen x 5 years    Anemia    Anxiety    Aortic atherosclerosis ([place])    Arthritis    Blood transfusion 1980's   Breast cancer ([place])    Cancer ([place])    Phreesia [id number]   Complication of anesthesia    has a hard time waking up; can't lay flat   Degenerative disc disease, lumbar    pressing on L3 and L4   Diabetes mellitus ([facility]) 1989   Type 2 NIDDM; "cancer treatment gave me diabetes"   Diabetes mellitus without complication ([place])    Phreesia 08/18/2020   Diverticulosis    DJD (degenerative joint disease) of lumbar spine    Dysrhythmia    "irregular"   Exertional dyspnea    Gastroparesis    GERD (gastroesophageal reflux disease)    mann   Heart murmur    "think I outgrew it"   Hepatic steatosis    History of kidney stones    History of stomach ulcers    Hx: UTI (urinary tract infection)    Hyperlipidemia    Hypersensitivity    in tongue   Hypertension    IBS (irritable bowel syndrome)    Insomnia  Internal hemorrhoids    Kidney cysts    RT KIDNEY   Kidney stones 2009   s/p lithotripsy   Mild CAD    Mild valvular heart disease    Nephrolithiasis 04/13/2014   Obesity    PFO (patent foramen ovale)    PONV (postoperative nausea and vomiting)    Pulmonary nodule    Shortness of breath    unable to lie flat   Ventral hernia     Past Surgical History:  Procedure Laterality Date   ABDOMINAL HYSTERECTOMY  2002   APPENDECTOMY     BACK SURGERY     Can't take any medical procedure in right arm   BREAST BIOPSY     right   BREAST SURGERY N/A    Phreesia 04/22/2020   CATARACT EXTRACTION W/ INTRAOCULAR LENS  IMPLANT, BILATERAL  2009-2010   CESAREAN SECTION  1973; 1978; [place] 04/22/2020   COLONOSCOPY WITH PROPOFOL N/A 10/29/2016   Procedure: COLONOSCOPY  WITH PROPOFOL;  Surgeon: Danie Binder, MD;  [facility];  Service: Endoscopy;  Laterality: N/A;  10:00 am   CYSTOSCOPY Left 04/13/2014   Procedure: CYSTOSCOPY FLEXIBLE;  Surgeon: Ardis Hughs, MD;  Location: [facility];  Service: Urology;  Laterality: Left;  with STENT   ESOPHAGOGASTRODUODENOSCOPY (EGD) WITH PROPOFOL N/A 10/29/2016   Procedure: ESOPHAGOGASTRODUODENOSCOPY (EGD) WITH PROPOFOL;  Surgeon: Danie Binder, MD;  [facility];  Service: Endoscopy;  Laterality: N/A;   EYE SURGERY     implant and screws put in the right lower jaw  11/2008   dental surgery   kidney blockage  ?1990's   " major surgery ;put kidney on pump for awhile"   LITHOTRIPSY     "several times"   LUMBAR FUSION  08/2010   MASTECTOMY  1997   right   NEPHROLITHOTOMY Left 04/13/2014   Procedure: LEFT PERCUTANEOUS NEPHROLITHOTOMY WITH SURGEON ACCESS;  Surgeon: Ardis Hughs, MD;  Location: [facility];  Service: Urology;  Laterality: Left;   OVARIAN CYST SURGERY     PARATHYROIDECTOMY  02/26/2012   Procedure: PARATHYROIDECTOMY;  Surgeon: Ascencion Dike, MD;  Location: [facility];  Service: ENT;;   SAVORY DILATION N/A 10/29/2016   Procedure: SAVORY DILATION;  Surgeon: Danie Binder, MD;  [facility];  Service: Endoscopy;  Laterality: N/A;   [place]  2011   urological surgery for blocked ureter secondary to kidney stone      Family History  Problem Relation Age of Onset   Birth defects Mother    Heart attack Mother    Cancer Brother        Esophageal; smoker; deceased 77s   Colon cancer Brother    Diabetes Daughter    Leukemia Paternal Aunt        deceased 59s   Pancreatic cancer Paternal Aunt        deceased 59   Cancer Paternal Aunt        GI cancer; deceased 46s   Cancer Cousin        kidney cancer; deceased 70; pat first cousin; daughter of aunt w/ leukemia   Cancer Cousin        colon cancer @ 50; pat first cousin; son of aunt with GI cancer   Anesthesia problems Neg Hx      Social History   Socioeconomic History   Marital status: Married    Spouse name: Not on file  Number of children: 3   Years of education: Not on file   Highest education level: Not on file  Occupational History   Occupation: retired in 2011  Tobacco Use   Smoking status: Never   Smokeless tobacco: Never  Vaping Use   Vaping Use: Never used  Substance and Sexual Activity   Alcohol use: Yes    Alcohol/week: 0.0 standard drinks of alcohol    Comment: X2 DRINKS PER YEAR   Drug use: No   Sexual activity: Not Currently    Birth control/protection: Surgical  Other Topics Concern   Not on file  Social History Narrative   Not on file   Social Determinants of Health   Financial Resource Strain: Low Risk  (12/15/2022)   Overall Financial Resource Strain (CARDIA)    Difficulty of Paying Living Expenses: Not hard at all  Food Insecurity: No Food Insecurity (12/15/2022)   Hunger Vital Sign    Worried About Running Out of Food in the Last Year: Never true    Ran Out of Food in the Last Year: Never true  Transportation Needs: No Transportation Needs (12/15/2022)   PRAPARE - Hydrologist (Medical): No    Lack of Transportation (Non-Medical): No  Physical Activity: Unknown (12/15/2022)   Exercise Vital Sign    Days of Exercise per Week: 0 days    Minutes of Exercise per Session: Not on file  Stress: Stress Concern Present (12/15/2022)   [place]    Feeling of Stress : To some extent  Social Connections: Socially Integrated (12/15/2022)   Social Connection and Isolation Panel [NHANES]    Frequency of Communication with Friends and Family: More than three times a week    Frequency of Social Gatherings with Friends and Family: Once a week    Attends Religious Services: More than 4 times per year    Active Member of [employer] or Organizations: Yes    Attends Archivist Meetings: 1 to  4 times per year    Marital Status: Married  Human resources officer Violence: Not on file    Outpatient Medications Prior to Visit  Medication Sig Dispense Refill   acetaminophen (TYLENOL) 325 MG tablet Take 325 mg by mouth every 6 (six) hours as needed for mild pain.     amLODipine (NORVASC) 5 MG tablet TAKE ONE TABLET BY MOUTH ONCE DAILY. 90 tablet 0   Blood Glucose Monitoring Suppl (BLOOD GLUCOSE SYSTEM PAK) KIT Use as directed to monitor FSBS 1x daily. Dx: E11.9 1 each 1   Cholecalciferol (VITAMIN D3 PO) Take 1 tablet by mouth daily.     CINNAMON PO Take 1 capsule by mouth. Takes sometimes     diclofenac Sodium (VOLTAREN) 1 % GEL Apply 4 g topically 4 (four) times daily. 4 g 1   famotidine (PEPCID) 20 MG tablet Take 1 tablet (20 mg total) by mouth 2 (two) times daily. 60 tablet 5   furosemide (LASIX) 40 MG tablet Take 1 tablet (40 mg total) by mouth daily as needed (FOR SWELLING). 90 tablet 0   glipiZIDE (GLUCOTROL) 10 MG tablet Take 1 tablet (10 mg total) by mouth 2 (two) times daily before a meal. 90 tablet 2   glucose blood (ACCU-CHEK GUIDE) test strip 1 each by Other route as needed for other. Use as instructed to check blood sugar daily. 100 each 3   LANCETS SUPER THIN 28G MISC Use to check blood sugar  once daily. 100 each 3   metFORMIN (GLUCOPHAGE) 500 MG tablet Take 2 tablets (1,000 mg total) by mouth 2 (two) times daily with a meal. (Patient taking differently: Take 500 mg by mouth daily.) 290 tablet 0   metoprolol tartrate (LOPRESSOR) 25 MG tablet Take 1 tablet (25 mg total) by mouth 2 (two) times daily. 180 tablet 3   Multiple Vitamin (MULTIVITAMIN) tablet Take 1 tablet by mouth daily.     potassium chloride (KLOR-CON) 10 MEQ tablet Take 1 tablet (10 mEq total) by mouth daily. 90 tablet 1   triamcinolone (KENALOG) 0.025 % ointment Apply 1 Application topically 2 (two) times daily. 30 g 0   No facility-administered medications prior to visit.    Allergies  Allergen Reactions    Demerol [Meperidine] Other (See Comments)    Lost consciousness   Bentyl [Dicyclomine Hcl] Swelling   Invokana [Canagliflozin] Hives   Metformin And Related Diarrhea   Protonix [Pantoprazole Sodium] Swelling   Statins Other (See Comments)    myalgia   Ace Inhibitors Cough    ROS Review of Systems  Constitutional:  Positive for fatigue. Negative for chills and fever.  HENT:  Negative for congestion, sinus pressure, sinus pain and sore throat.   Eyes:  Negative for pain and discharge.  Respiratory:  Negative for cough and shortness of breath.   Cardiovascular:  Negative for chest pain and palpitations.  Gastrointestinal:  Negative for abdominal pain, diarrhea, nausea and vomiting.  Endocrine: Negative for polydipsia and polyuria.  Genitourinary:  Negative for dysuria and hematuria.  Musculoskeletal:  Positive for arthralgias and back pain. Negative for neck pain and neck stiffness.  Skin:  Negative for rash.  Neurological:  Negative for dizziness and weakness.       Numbness and tingling in b/l LE  Psychiatric/Behavioral:  Negative for agitation and behavioral problems.       Objective:    Physical Exam Vitals reviewed.  Constitutional:      General: She is not in acute distress.    Appearance: She is not diaphoretic.  HENT:     Head: Normocephalic and atraumatic.     Nose: Nose normal.     Mouth/Throat:     Mouth: Mucous membranes are moist.  Eyes:     General: No scleral icterus.    Extraocular Movements: Extraocular movements intact.  Cardiovascular:     Rate and Rhythm: Normal rate and regular rhythm.     Heart sounds: Normal heart sounds. No murmur heard. Pulmonary:     Breath sounds: Normal breath sounds. No wheezing or rales.  Musculoskeletal:     Cervical back: Neck supple. No tenderness.     Right lower leg: No edema.     Left lower leg: No edema.  Skin:    General: Skin is warm.     Findings: No rash.  Neurological:     General: No focal deficit  present.     Mental Status: She is alert and oriented to person, place, and time.     Sensory: Sensory deficit (B/l feet) present.     Motor: No weakness.  Psychiatric:        Mood and Affect: Mood normal.        Behavior: Behavior normal.     BP 125/76 (BP Location: Left Arm, Patient Position: Sitting, Cuff Size: Normal)   Pulse 81   Ht 5\' 6"  (1.676 m)   Wt 176 lb 3.2 oz (79.9 kg)   SpO2 97%   BMI  28.44 kg/m  Wt Readings from Last 3 Encounters:  12/19/22 176 lb 3.2 oz (79.9 kg)  11/27/22 173 lb (78.5 kg)  10/11/22 173 lb 9.6 oz (78.7 kg)    Lab Results  Component Value Date   TSH 0.844 09/17/2022   Lab Results  Component Value Date   WBC 8.5 09/17/2022   HGB 10.9 (L) 09/17/2022   HCT 32.1 (L) 09/17/2022   MCV 77 (L) 09/17/2022   PLT 240 09/17/2022   Lab Results  Component Value Date   NA 140 09/17/2022   K 3.9 09/17/2022   CO2 23 09/17/2022   GLUCOSE 186 (H) 09/17/2022   BUN 12 09/17/2022   CREATININE 1.16 (H) 09/17/2022   BILITOT 0.4 09/17/2022   ALKPHOS 67 09/17/2022   AST 18 09/17/2022   ALT 12 09/17/2022   PROT 6.8 09/17/2022   ALBUMIN 3.8 09/17/2022   CALCIUM 9.1 09/17/2022   ANIONGAP 7 02/11/2022   EGFR 49 (L) 09/17/2022   GFR 55.96 (L) 02/23/2020   Lab Results  Component Value Date   CHOL 201 (H) 09/17/2022   Lab Results  Component Value Date   HDL 82 09/17/2022   Lab Results  Component Value Date   LDLCALC 101 (H) 09/17/2022   Lab Results  Component Value Date   TRIG 104 09/17/2022   Lab Results  Component Value Date   CHOLHDL 2.5 09/17/2022   Lab Results  Component Value Date   HGBA1C 8.6 (H) 09/17/2022      Assessment & Plan:   Problem List Items Addressed This Visit       Cardiovascular and Mediastinum   Essential hypertension    BP Readings from Last 1 Encounters:  12/19/22 125/76  Well controlled with amlodipine and metoprolol Counseled for compliance with the medications Advised DASH diet and moderate  exercise/walking, at least 150 mins/week        Endocrine   Uncontrolled type 2 diabetes mellitus with hyperglycemia, without long-term current use of insulin (HCC)    Lab Results  Component Value Date   HGBA1C 8.6 (H) 09/17/2022  Uncontrolled On Metformin 500 mg QD and Glipizide 10 mg BID Did not tolerate Jardiance, Januvia and GLP-1 agonist in the past Needs to follow diabetic diet F/u CMP and lipid panel Diabetic eye exam: Advised to follow up with Ophthalmology for diabetic eye exam      Relevant Orders   CMP14+EGFR   Hemoglobin A1c   Diabetic neuropathy ([place])    Did not tolerate gabapentin or Lyrica Unclear if her gait disturbance is due to diabetic neuropathy She has chronic burning pain and tingling in the UE and LE Started Cymbalta 30 mg QD Followed by neurology      Relevant Medications   DULoxetine (CYMBALTA) 30 MG capsule     Other   Balance problem    Has recurrent falls without prodromal symptoms Reports leg weakness and numbness Uses cane now Continue PT      Gait abnormality - Primary    Has led to frequent falls Unclear etiology, diabetic neuropathy could be a risk factor Has had neurology evaluation, has been referred for PT Had CT head, which showed chronic small vessel ischemia and small chronic left frontal CVA, although it is less likely to cause her current symptoms  Check MRI of brain to rule out demyelinating lesions      Relevant Orders   MR Brain Wo Contrast    Meds ordered this encounter  Medications   DULoxetine (CYMBALTA)  30 MG capsule    Sig: Take 1 capsule (30 mg total) by mouth daily.    Dispense:  30 capsule    Refill:  2    Follow-up: Return in about 4 months (around 04/20/2023) for DM and gait imbalance.    Lindell Spar, MD

## 2022-12-20 LAB — CMP14+EGFR
ALT: 20 IU/L (ref 0–32)
AST: 19 IU/L (ref 0–40)
Albumin/Globulin Ratio: 1.3 (ref 1.2–2.2)
Albumin: 4.2 g/dL (ref 3.8–4.8)
Alkaline Phosphatase: 91 IU/L (ref 44–121)
BUN/Creatinine Ratio: 12 (ref 12–28)
BUN: 13 mg/dL (ref 8–27)
Bilirubin Total: 0.6 mg/dL (ref 0.0–1.2)
CO2: 21 mmol/L (ref 20–29)
Calcium: 9.6 mg/dL (ref 8.7–10.3)
Chloride: 99 mmol/L (ref 96–106)
Creatinine, Ser: 1.09 mg/dL — ABNORMAL HIGH (ref 0.57–1.00)
Globulin, Total: 3.2 g/dL (ref 1.5–4.5)
Glucose: 241 mg/dL — ABNORMAL HIGH (ref 70–99)
Potassium: 3.8 mmol/L (ref 3.5–5.2)
Sodium: 141 mmol/L (ref 134–144)
Total Protein: 7.4 g/dL (ref 6.0–8.5)
eGFR: 53 mL/min/{1.73_m2} — ABNORMAL LOW (ref >59)

## 2022-12-20 LAB — HEMOGLOBIN A1C
Est. average glucose Bld gHb Est-mCnc: 229 mg/dL
Hgb A1c MFr Bld: 9.6 % — ABNORMAL HIGH (ref 4.8–5.6)

## 2022-12-23 ENCOUNTER — Ambulatory Visit: Payer: Medicare Other | Attending: Neurology | Admitting: Physical Therapy

## 2022-12-23 DIAGNOSIS — M6281 Muscle weakness (generalized): Secondary | ICD-10-CM | POA: Diagnosis present

## 2022-12-23 DIAGNOSIS — R2689 Other abnormalities of gait and mobility: Secondary | ICD-10-CM | POA: Diagnosis present

## 2022-12-23 DIAGNOSIS — R2681 Unsteadiness on feet: Secondary | ICD-10-CM | POA: Insufficient documentation

## 2022-12-23 NOTE — Therapy (Signed)
OUTPATIENT PHYSICAL THERAPY NEURO TREATMENT NOTE   Patient Name: [name] MRN: [id number] DOB:14-Feb-1946, 77 y.o., female Today's Date: 12/24/2022   PCP: Anabel Halon, MD REFERRING PROVIDER: Windell Norfolk, MD  END OF SESSION:  PT End of Session - 12/24/22 1436     Visit Number 3    Number of Visits 17    Date for PT Re-Evaluation 02/07/23    Authorization Type [employer] Medicare    Authorization Time Period 12-09-22 - 02-21-23    PT Start Time 0932    PT Stop Time 1016    PT Time Calculation (min) 44 min    Activity Tolerance Patient tolerated treatment well    Behavior During Therapy [facility] for tasks assessed/performed               Past Medical History:  Diagnosis Date   (HFpEF) heart failure with preserved ejection fraction    Abdominal hernia 02/26/2012   unrepaired   Adenocarcinoma of breast 1997   right / chemo + tamoxifen x 5 years    Anemia    Anxiety    Aortic atherosclerosis    Arthritis    Blood transfusion 1980's   Breast cancer    Cancer    Phreesia 08/18/2020   Complication of anesthesia    has a hard time waking up; can't lay flat   Degenerative disc disease, lumbar    pressing on L3 and L4   Diabetes mellitus 1989   Type 2 NIDDM; "cancer treatment gave me diabetes"   Diabetes mellitus without complication    Phreesia 08/18/2020   Diverticulosis    DJD (degenerative joint disease) of lumbar spine    Dysrhythmia    "irregular"   Exertional dyspnea    Gastroparesis    GERD (gastroesophageal reflux disease)    mann   Heart murmur    "think I outgrew it"   Hepatic steatosis    History of kidney stones    History of stomach ulcers    Hx: UTI (urinary tract infection)    Hyperlipidemia    Hypersensitivity    in tongue   Hypertension    IBS (irritable bowel syndrome)    Insomnia    Internal hemorrhoids    Kidney cysts    RT KIDNEY   Kidney stones 2009   s/p lithotripsy   Mild CAD    Mild valvular heart disease     Nephrolithiasis 04/13/2014   Obesity    PFO (patent foramen ovale)    PONV (postoperative nausea and vomiting)    Pulmonary nodule    Shortness of breath    unable to lie flat   Ventral hernia    Past Surgical History:  Procedure Laterality Date   ABDOMINAL HYSTERECTOMY  2002   APPENDECTOMY     BACK SURGERY     Can't take any medical procedure in right arm   BREAST BIOPSY     right   BREAST SURGERY N/A    Phreesia 04/22/2020   CATARACT EXTRACTION W/ INTRAOCULAR LENS  IMPLANT, BILATERAL  2009-2010   CESAREAN SECTION  1973; 1978; 1981   CESAREAN SECTION N/A    Phreesia 04/22/2020   COLONOSCOPY WITH PROPOFOL N/A 10/29/2016   Procedure: COLONOSCOPY WITH PROPOFOL;  Surgeon: West Bali, MD;  [facility];  Service: Endoscopy;  Laterality: N/A;  10:00 am   CYSTOSCOPY Left 04/13/2014   Procedure: CYSTOSCOPY FLEXIBLE;  Surgeon: Crist Fat, MD;  Location: [facility];  Service: Urology;  Laterality: Left;  with STENT   ESOPHAGOGASTRODUODENOSCOPY (EGD) WITH PROPOFOL N/A 10/29/2016   Procedure: ESOPHAGOGASTRODUODENOSCOPY (EGD) WITH PROPOFOL;  Surgeon: West Bali, MD;  [facility];  Service: Endoscopy;  Laterality: N/A;   EYE SURGERY     implant and screws put in the right lower jaw  11/2008   dental surgery   kidney blockage  ?1990's   " major surgery ;put kidney on pump for awhile"   LITHOTRIPSY     "several times"   LUMBAR FUSION  08/2010   MASTECTOMY  1997   right   NEPHROLITHOTOMY Left 04/13/2014   Procedure: LEFT PERCUTANEOUS NEPHROLITHOTOMY WITH SURGEON ACCESS;  Surgeon: Crist Fat, MD;  Location: [facility];  Service: Urology;  Laterality: Left;   OVARIAN CYST SURGERY     PARATHYROIDECTOMY  02/26/2012   Procedure: PARATHYROIDECTOMY;  Surgeon: Darletta Moll, MD;  Location: [facility];  Service: ENT;;   SAVORY DILATION N/A 10/29/2016   Procedure: SAVORY DILATION;  Surgeon: West Bali, MD;  [facility];  Service: Endoscopy;  Laterality: N/A;    SPINE SURGERY  2011   urological surgery for blocked ureter secondary to kidney stone     Patient Active Problem List   Diagnosis Date Noted   Gait abnormality 12/19/2022   Peripheral neuropathy 09/17/2022   Right foot pain 05/02/2022   Hypokalemia 05/02/2022   Overactive bladder 02/08/2022   Left knee pain 02/08/2022   Right leg swelling 02/08/2022   Encounter for general adult medical examination with abnormal findings 08/30/2021   Chronic fatigue 08/30/2021   Vitamin B12 deficiency 08/30/2021   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   Balance problem 12/26/2020   Thyroid nodule 01/16/2018   Overweight (BMI 25.0-29.9) 12/18/2017   Diabetic neuropathy 10/13/2017   Genetic testing 11/28/2016   Esophageal dysphagia 09/30/2016   Ventral hernia 07/28/2015   Situational anxiety 04/26/2015   Thyromegaly 01/23/2015   Iron deficiency 06/17/2014   DDD (degenerative disc disease), lumbar 01/26/2014   Bulge of lumbar disc without myelopathy 01/26/2014   Renal cyst 01/26/2014   Back pain with left-sided sciatica 12/28/2013   Uncontrolled type 2 diabetes mellitus with hyperglycemia, without long-term current use of insulin 08/01/2013   GASTROPARESIS 12/15/2008   GERD (gastroesophageal reflux disease) 06/21/2008   IBS (irritable bowel syndrome) 06/21/2008   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 02/09/2008   Hyperlipidemia 02/09/2008   Essential hypertension 02/09/2008   Insomnia 02/09/2008    ONSET DATE: May 2023:  Referral date 11-27-22  REFERRING DIAG: R26.9 (ICD-10-CM) - Gait abnormality W19.XXXD (ICD-10-CM) - Fall, subsequent encounter  THERAPY DIAG:  Muscle weakness (generalized)  Other abnormalities of gait and mobility  Rationale for Evaluation and Treatment: Rehabilitation  SUBJECTIVE:  SUBJECTIVE STATEMENT: Pt reports she has been doing exercises - feels she is getting stronger; pt says she is having sciatica RLE today, which is impacting her walking and balance Pt accompanied by: self  PERTINENT HISTORY: This is a 77 year old woman with past medical history of breast cancer, s/p chemotherapy, hypertension, diabetes, who is presenting with multiple falls. Patient reports since May of last year, she has a total of 6 falls resulting in torn ligament, and bruises. She denies any prodromes prior to the fall, no warning signs, no dizziness, no lightheadedness  PAIN:  Are you having pain? Yes: NPRS scale: 8/10 Pain location: Rt low back/hip due to sciatica Aggravating factors: no specific factors Relieving factors: ice and Advil  PRECAUTIONS: Fall  WEIGHT BEARING RESTRICTIONS: No  FALLS: Has patient fallen in last 6 months? Yes. Number of falls approx. 3   LIVING ENVIRONMENT: Lives with: lives with their spouse and lives with their daughter Lives in: House/apartment Stairs: Yes: Internal: 12 steps; on right going up and External: 1 steps; none pt does not have to go up to 2nd level Has following equipment at home: Single point cane and Walker - 2 wheeled  PLOF: Independent  PATIENT GOALS: improve walking and leg strength  OBJECTIVE:    GAIT: Gait pattern:  decreased push off due to gastroc weakness LLE and step through pattern Distance walked: 50' Assistive device utilized: Single point cane Level of assistance: SBA Comments: pt would benefit from use of RW Gait velocity;  16.4 secs with use of SPC;  2.0 ft/sec    THEREX:   Reviewed HEP - pt performed bridging, unilateral bridge LLE only - did not perform with RLE due to sciatica Sit to stand from standard chair with minimal use of UE's Sidelying hip abduction RLE and LLE 10 reps each - no resistance Clam shell 10 reps each leg - no resistance SLR LLE only 10 reps  Hip flexion in hooklying position 10  reps each leg - no resistance  ADDED: standing bil. Heel raises 10 reps with UE support prn; piriformis stretch RLE due to sciatica  Scifit level 2.5 x 5" with UE and LE's     Pt instructed in HEP for bil. LE strengthening - Medbridge HEP [email or web address]; pt performed exercises listed below 10 reps each leg; resistance used was red theraband  Access Code: [email or web address] URL: [email or web address] Date: 12/18/2022 Prepared by: Maebelle Munroe  Exercises - Supine Bridge  - 1 x daily - 7 x weekly - 1 sets - 10 reps - 5 sec hold - Single Leg Bridge  - 1 x daily - 7 x weekly - 1 sets - 10 reps - 3 sec hold - Sidelying Hip Abduction  - 1 x daily - 7 x weekly - 3 sets - 10 reps - Clamshell  - 1 x daily - 7 x weekly - 1 sets - 10 reps - Supine Hip Flexion  - 1 x daily - 7 x weekly - 1 sets - 10 reps - 3 sec hold - Hooklying Clamshell with Resistance  - 1 x daily - 7 x weekly - 1 sets - 10 reps - Standing Hip Extension with Resistance at Ankles and Counter Support  - 1 x daily - 7 x weekly - 1 sets - 10 reps - 3 sec hold hold - Sit to Stand  - 1 x daily - 7 x weekly - 1 sets - 10 reps      PATIENT EDUCATION: Education details: Medbridge  HEP;   12-24-22 - added piriformis stretch and heel raises to HEP  Person educated: Patient Education method: Explanation Education comprehension: verbalized understanding  HOME EXERCISE PROGRAM: To be established  GOALS: Goals reviewed with patient? Yes   SHORT TERM GOALS: Target date: 01-10-23  Improve 5x sit to stand score to </= 23 secs with RUE support from standard chair. Baseline:  28.06 secs Goal status: INITIAL  2.  Amb. 250' with RW with SBA for increased community accessibility.  Baseline:  Goal status: INITIAL  3. Improve gait velocity by at least .3 ft/sec with use of SPC for increased gait efficiency. Baseline:  Goal status: INITIAL  4.  Improve TUG score to </= 19 secs with SPC for reduced fall risk. Baseline: 22,31 secs with  SPC Goal status: INITIAL  5.  Independent in HEP for bil. LE strengthening and balance exercises.  Baseline:  Goal status: INITIAL   LONG TERM GOALS: Target date: 02-07-23  Improve 5x sit to stand score to </= 20 secs with RUE support from standard chair. Baseline:  28.06 secs Goal status: INITIAL  2.  Amb. 460' with RW with SBA for increased community accessibility.  Baseline:  Goal status: INITIAL  3.   Improve gait velocity by at least .6 ft/sec with use of SPC for increased gait efficiency. Baseline:  Goal status: INITIAL  4.   Improve TUG score to </= 15 secs with SPC for reduced fall risk. Baseline: 22.31 secs Goal status: INITIAL  5.  Independent in updated HEP for balance and strengthening. Baseline:  Goal status: INITIAL    ASSESSMENT:  CLINICAL IMPRESSION: PT session focused on bil. LE strengthening with review of HEP.  No resistance was used in today's session due to c/o sciatica RLE.  Added standing heel raises bil. LE's and piriformis stretch to HEP.  Pt demonstrates increased strength in bil. LE's and reports she feels a little stronger.  Cont with POC.     OBJECTIVE IMPAIRMENTS: Abnormal gait, decreased activity tolerance, decreased balance, decreased strength, dizziness, impaired sensation, and pain.   ACTIVITY LIMITATIONS: carrying, bending, standing, squatting, stairs, and locomotion level  PARTICIPATION LIMITATIONS: meal prep, cleaning, laundry, driving, shopping, and community activity  PERSONAL FACTORS: Past/current experiences, Time since onset of injury/illness/exacerbation, and unknown etiology of falls  are also affecting patient's functional outcome.   REHAB POTENTIAL: Good  CLINICAL DECISION MAKING: Evolving/moderate complexity  EVALUATION COMPLEXITY: Moderate  PLAN:  PT FREQUENCY: 2x/week  PT DURATION: 8 weeks  PLANNED INTERVENTIONS: Therapeutic exercises, Therapeutic activity, Neuromuscular re-education, Balance training, Gait  training, Patient/Family education, Self Care, Stair training, Vestibular training, DME instructions, and Aquatic Therapy  PLAN FOR NEXT SESSION: do orthostatic assessment; gait with RW?;  continue strengthening exercises   Elantra Caprara, Donavan Burnet, PT 12/24/2022, 2:37 PM

## 2022-12-24 ENCOUNTER — Encounter: Payer: Self-pay | Admitting: Physical Therapy

## 2022-12-26 ENCOUNTER — Encounter: Payer: Self-pay | Admitting: Physical Therapy

## 2022-12-26 ENCOUNTER — Ambulatory Visit: Payer: Medicare Other | Admitting: Physical Therapy

## 2022-12-26 DIAGNOSIS — M6281 Muscle weakness (generalized): Secondary | ICD-10-CM | POA: Diagnosis not present

## 2022-12-26 DIAGNOSIS — R2689 Other abnormalities of gait and mobility: Secondary | ICD-10-CM

## 2022-12-26 NOTE — Therapy (Signed)
OUTPATIENT PHYSICAL THERAPY NEURO TREATMENT NOTE   Patient Name: [name] MRN: [id number] DOB:Apr 18, 1946, 77 y.o., female Today's Date: 12/26/2022   PCP: Lindell Spar, MD REFERRING PROVIDER: Alric Ran, MD  END OF SESSION:  PT End of Session - 12/26/22 1103     Visit Number 4    Number of Visits 17    Date for PT Re-Evaluation 02/07/23    Authorization Type UHC Medicare    Authorization Time Period 12-09-22 - 02-21-23    PT Start Time 1010    PT Stop Time 1100    PT Time Calculation (min) 50 min    Activity Tolerance Patient tolerated treatment well    Behavior During Therapy [facility] for tasks assessed/performed                Past Medical History:  Diagnosis Date   (HFpEF) heart failure with preserved ejection fraction    Abdominal hernia 02/26/2012   unrepaired   Adenocarcinoma of breast 1997   right / chemo + tamoxifen x 5 years    Anemia    Anxiety    Aortic atherosclerosis    Arthritis    Blood transfusion 1980's   Breast cancer    Cancer    Phreesia [id number]   Complication of anesthesia    has a hard time waking up; can't lay flat   Degenerative disc disease, lumbar    pressing on L3 and L4   Diabetes mellitus 1989   Type 2 NIDDM; "cancer treatment gave me diabetes"   Diabetes mellitus without complication    Phreesia 08/18/2020   Diverticulosis    DJD (degenerative joint disease) of lumbar spine    Dysrhythmia    "irregular"   Exertional dyspnea    Gastroparesis    GERD (gastroesophageal reflux disease)    mann   Heart murmur    "think I outgrew it"   Hepatic steatosis    History of kidney stones    History of stomach ulcers    Hx: UTI (urinary tract infection)    Hyperlipidemia    Hypersensitivity    in tongue   Hypertension    IBS (irritable bowel syndrome)    Insomnia    Internal hemorrhoids    Kidney cysts    RT KIDNEY   Kidney stones 2009   s/p lithotripsy   Mild CAD    Mild valvular heart disease     Nephrolithiasis 04/13/2014   Obesity    PFO (patent foramen ovale)    PONV (postoperative nausea and vomiting)    Pulmonary nodule    Shortness of breath    unable to lie flat   Ventral hernia    Past Surgical History:  Procedure Laterality Date   ABDOMINAL HYSTERECTOMY  2002   APPENDECTOMY     BACK SURGERY     Can't take any medical procedure in right arm   BREAST BIOPSY     right   BREAST SURGERY N/A    Phreesia 04/22/2020   CATARACT EXTRACTION W/ INTRAOCULAR LENS  IMPLANT, BILATERAL  2009-2010   CESAREAN SECTION  1973; 1978; [place] 04/22/2020   COLONOSCOPY WITH PROPOFOL N/A 10/29/2016   Procedure: COLONOSCOPY WITH PROPOFOL;  Surgeon: Danie Binder, MD;  [facility];  Service: Endoscopy;  Laterality: N/A;  10:00 am   CYSTOSCOPY Left 04/13/2014   Procedure: CYSTOSCOPY FLEXIBLE;  Surgeon: Ardis Hughs, MD;  [facility];  Service:  Urology;  Laterality: Left;  with STENT   ESOPHAGOGASTRODUODENOSCOPY (EGD) WITH PROPOFOL N/A 10/29/2016   Procedure: ESOPHAGOGASTRODUODENOSCOPY (EGD) WITH PROPOFOL;  Surgeon: Danie Binder, MD;  [facility];  Service: Endoscopy;  Laterality: N/A;   EYE SURGERY     implant and screws put in the right lower jaw  11/2008   dental surgery   kidney blockage  ?1990's   " major surgery ;put kidney on pump for awhile"   LITHOTRIPSY     "several times"   LUMBAR FUSION  08/2010   MASTECTOMY  1997   right   NEPHROLITHOTOMY Left 04/13/2014   Procedure: LEFT PERCUTANEOUS NEPHROLITHOTOMY WITH SURGEON ACCESS;  Surgeon: Ardis Hughs, MD;  [facility];  Service: Urology;  Laterality: Left;   OVARIAN CYST SURGERY     PARATHYROIDECTOMY  02/26/2012   Procedure: PARATHYROIDECTOMY;  Surgeon: Ascencion Dike, MD;  Location: [place];  Service: ENT;;   SAVORY DILATION N/A 10/29/2016   Procedure: SAVORY DILATION;  Surgeon: Danie Binder, MD;  [facility];  Service: Endoscopy;  Laterality: N/A;    [place]  2011   urological surgery for blocked ureter secondary to kidney stone     Patient Active Problem List   Diagnosis Date Noted   Gait abnormality 12/19/2022   Peripheral neuropathy 09/17/2022   Right foot pain 05/02/2022   Hypokalemia 05/02/2022   Overactive bladder 02/08/2022   Left knee pain 02/08/2022   Right leg swelling 02/08/2022   Encounter for general adult medical examination with abnormal findings 08/30/2021   Chronic fatigue 08/30/2021   Vitamin B12 deficiency 08/30/2021   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   Balance problem 12/26/2020   Thyroid nodule 01/16/2018   Overweight (BMI 25.0-29.9) 12/18/2017   Diabetic neuropathy 10/13/2017   Genetic testing 11/28/2016   Esophageal dysphagia 09/30/2016   Ventral hernia 07/28/2015   Situational anxiety 04/26/2015   Thyromegaly 01/23/2015   Iron deficiency 06/17/2014   DDD (degenerative disc disease), lumbar 01/26/2014   Bulge of lumbar disc without myelopathy 01/26/2014   Renal cyst 01/26/2014   Back pain with left-sided sciatica 12/28/2013   Uncontrolled type 2 diabetes mellitus with hyperglycemia, without long-term current use of insulin 08/01/2013   GASTROPARESIS 12/15/2008   GERD (gastroesophageal reflux disease) 06/21/2008   IBS (irritable bowel syndrome) 06/21/2008   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 02/09/2008   Hyperlipidemia 02/09/2008   Essential hypertension 02/09/2008   Insomnia 02/09/2008    ONSET DATE: May 2023:  Referral date 11-27-22  REFERRING DIAG: R26.9 (ICD-10-CM) - Gait abnormality W19.XXXD (ICD-10-CM) - Fall, subsequent encounter  THERAPY DIAG:  Muscle weakness (generalized)  Other abnormalities of gait and mobility  Rationale for Evaluation and Treatment: Rehabilitation  SUBJECTIVE:  SUBJECTIVE STATEMENT: Pt reports she is having moderate to severe pain in RLE - she initially attributes pain to sciatica but says pain is in front of Rt leg (quad and hip flexor region);  after discussion it appears that she overdid exercise on her elliptical at home - states it sits very low - appears to have strained Rt hip flexors  Pt accompanied by: self  PERTINENT HISTORY: This is a 77 year old woman with past medical history of breast cancer, s/p chemotherapy, hypertension, diabetes, who is presenting with multiple falls. Patient reports since May of last year, she has a total of 6 falls resulting in torn ligament, and bruises. She denies any prodromes prior to the fall, no warning signs, no dizziness, no lightheadedness  PAIN:  Are you having pain? Yes: NPRS scale: 5.5/10 Pain location: Rt quads Aggravating factors: no specific factors Relieving factors: ice and Advil  PRECAUTIONS: Fall  WEIGHT BEARING RESTRICTIONS: No  FALLS: Has patient fallen in last 6 months? Yes. Number of falls approx. 3   LIVING ENVIRONMENT: Lives with: lives with their spouse and lives with their daughter Lives in: House/apartment Stairs: Yes: Internal: 12 steps; on right going up and External: 1 steps; none pt does not have to go up to 2nd level Has following equipment at home: Single point cane and Walker - 2 wheeled  PLOF: Independent  PATIENT GOALS: improve walking and leg strength  OBJECTIVE:    GAIT: Gait pattern:  decreased push off due to gastroc weakness LLE and step through pattern Distance walked: 65' with RW at end of session; 150' from clinic gym to car parked on far side of parking lot with RW  Assistive device utilized: RW Level of assistance: SBA Comments: pt had severe pain with lifting RLE up into car from seated position       THEREX:   Seated LAQ's RLE 2 sets 10 reps Seated hip abduction/adduction with knee extended with foot on floor - 2 sets 10 reps  Attempted  transferring from seated to supine - min assist given to lift RLE onto mat - too painful - pt transferred to Rt sidelying position and lifted legs onto mat in sidelying position Moist heat 15" to Rt anterior thigh (quad muscle) in supine position - bolster under both knees SAQ's RLE in supine - 10 reps x 2 sets Sidelying position - Rt hip flexion/extension with RLE totally supported by PT - 10 reps Clam shell RLE in Lt sidelying position 10 reps  Pt performed ambulation with RW after completion of above exercises for low impact activity due to c/o RLE pain - amb. From gym to car with RW for reduced pain RLE     12-18-22: Pt instructed in HEP for bil. LE strengthening - [place]; pt performed exercises listed below 10 reps each leg; resistance used was red theraband  Access Code: [email or web address] URL: [email or web address] Date: 12/18/2022 Prepared by: Ethelene Browns  Exercises - Supine Bridge  - 1 x daily - 7 x weekly - 1 sets - 10 reps - 5 sec hold - Single Leg Bridge  - 1 x daily - 7 x weekly - 1 sets - 10 reps - 3 sec hold - Sidelying Hip Abduction  - 1 x daily - 7 x weekly - 3 sets - 10 reps - Clamshell  - 1 x daily - 7 x weekly - 1 sets - 10 reps - Supine Hip Flexion  - 1 x daily - 7 x weekly -  1 sets - 10 reps - 3 sec hold - Hooklying Clamshell with Resistance  - 1 x daily - 7 x weekly - 1 sets - 10 reps - Standing Hip Extension with Resistance at Ankles and Counter Support  - 1 x daily - 7 x weekly - 1 sets - 10 reps - 3 sec hold hold - Sit to Stand  - 1 x daily - 7 x weekly - 1 sets - 10 reps      PATIENT EDUCATION: Education details: Medbridge HEP;   12-24-22 - added piriformis stretch and heel raises to HEP  Person educated: Patient Education method: Explanation Education comprehension: verbalized understanding  HOME EXERCISE PROGRAM: To be established  GOALS: Goals reviewed with patient? Yes   SHORT TERM GOALS: Target date: 01-10-23  Improve 5x  sit to stand score to </= 23 secs with RUE support from standard chair. Baseline:  28.06 secs Goal status: INITIAL  2.  Amb. 250' with RW with SBA for increased community accessibility.  Baseline:  Goal status: INITIAL  3. Improve gait velocity by at least .3 ft/sec with use of SPC for increased gait efficiency. Baseline:  Goal status: INITIAL  4.  Improve TUG score to </= 19 secs with SPC for reduced fall risk. Baseline: 22,31 secs with SPC Goal status: INITIAL  5.  Independent in HEP for bil. LE strengthening and balance exercises.  Baseline:  Goal status: INITIAL   LONG TERM GOALS: Target date: 02-07-23  Improve 5x sit to stand score to </= 20 secs with RUE support from standard chair. Baseline:  28.06 secs Goal status: INITIAL  2.  Amb. 460' with RW with SBA for increased community accessibility.  Baseline:  Goal status: INITIAL  3.   Improve gait velocity by at least .6 ft/sec with use of SPC for increased gait efficiency. Baseline:  Goal status: INITIAL  4.   Improve TUG score to </= 15 secs with SPC for reduced fall risk. Baseline: 22.31 secs Goal status: INITIAL  5.  Independent in updated HEP for balance and strengthening. Baseline:  Goal status: INITIAL    ASSESSMENT:  CLINICAL IMPRESSION: Pt reporting increased pain in RLE due to overdoing it on her elliptical machine at home on Tuesday this week.  Pt had severe pain in RLE with active hip flexion in seated and standing positions.  Treatment session was modified to reduce strain and exertion of Rt quads and hip flexors.  Pt reported pain reduced from 5.5/10 in Rt quads to 4/5 at end of session and was able to ambulate with RW comfortably with increased gait speed noted.  Recommended pt to use ice and Voltaren gel on Rt quads & hip flexors.  Cont with POC.     OBJECTIVE IMPAIRMENTS: Abnormal gait, decreased activity tolerance, decreased balance, decreased strength, dizziness, impaired sensation, and pain.    ACTIVITY LIMITATIONS: carrying, bending, standing, squatting, stairs, and locomotion level  PARTICIPATION LIMITATIONS: meal prep, cleaning, laundry, driving, shopping, and community activity  PERSONAL FACTORS: Past/current experiences, Time since onset of injury/illness/exacerbation, and unknown etiology of falls  are also affecting patient's functional outcome.   REHAB POTENTIAL: Good  CLINICAL DECISION MAKING: Evolving/moderate complexity  EVALUATION COMPLEXITY: Moderate  PLAN:  PT FREQUENCY: 2x/week  PT DURATION: 8 weeks  PLANNED INTERVENTIONS: Therapeutic exercises, Therapeutic activity, Neuromuscular re-education, Balance training, Gait training, Patient/Family education, Self Care, Stair training, Vestibular training, DME instructions, and Aquatic Therapy  PLAN FOR NEXT SESSION: How is RLE pain? Using RW to ambulate at  home? do orthostatic assessment;  continue strengthening exercises   Latiffany Harwick, [place], PT 12/26/2022, 11:05 AM

## 2022-12-30 ENCOUNTER — Ambulatory Visit: Payer: Medicare Other | Admitting: Physical Therapy

## 2022-12-30 DIAGNOSIS — M6281 Muscle weakness (generalized): Secondary | ICD-10-CM

## 2022-12-30 DIAGNOSIS — R2689 Other abnormalities of gait and mobility: Secondary | ICD-10-CM

## 2022-12-30 NOTE — Therapy (Signed)
OUTPATIENT PHYSICAL THERAPY NEURO TREATMENT NOTE   Patient Name: [name] MRN: [id number] DOB:May 21, 1946, 77 y.o., female Today's Date: 12/31/2022   PCP: Anabel HalonPatel, Rutwik K, MD REFERRING PROVIDER: Windell Norfolkamara, Amadou, MD  END OF SESSION:  PT End of Session - 12/31/22 0831     Visit Number 5    Number of Visits 17    Date for PT Re-Evaluation 02/07/23    Authorization Type [employer] Medicare    Authorization Time Period 12-09-22 - 02-21-23    PT Start Time 0932    PT Stop Time 1015    PT Time Calculation (min) 43 min    Activity Tolerance Patient tolerated treatment well    [facility] tasks assessed/performed                 Past Medical History:  Diagnosis Date   (HFpEF) heart failure with preserved ejection fraction    Abdominal hernia 02/26/2012   unrepaired   Adenocarcinoma of breast 1997   right / chemo + tamoxifen x 5 years    Anemia    Anxiety    Aortic atherosclerosis    Arthritis    Blood transfusion 1980's   Breast cancer    Cancer    [employer]rees[employer] 08/18/2020   Complication of anesthesia    has a hard time waking up; can't lay flat   Degenerative disc disease, lumbar    pressing on L3 and L4   Diabetes mellitus 1989   Type 2 NIDDM; "cancer treatment gave me diabetes"   Diabetes mellitus without complication    [employer] 08/18/2020   Diverticulosis    DJD (degenerative joint disease) of lumbar spine    Dysrhythmia    "irregular"   Exertional dyspnea    Gastroparesis    GERD (gastroesophageal reflux disease)    mann   Heart murmur    "think I outgrew it"   Hepatic steatosis    History of kidney stones    History of stomach ulcers    Hx: UTI (urinary tract infection)    Hyperlipidemia    Hypersensitivity    in tongue   Hypertension    IBS (irritable bowel syndrome)    Insomnia    Internal hemorrhoids    Kidney cysts    RT KIDNEY   Kidney stones 2009   s/p lithotripsy   Mild CAD    Mild valvular heart disease     Nephrolithiasis 04/13/2014   Obesity    PFO (patent foramen ovale)    PONV (postoperative nausea and vomiting)    Pulmonary nodule    Shortness of breath    unable to lie flat   Ventral hernia    Past Surgical History:  Procedure Laterality Date   ABDOMINAL HYSTERECTOMY  2002   APPENDECTOMY     BACK SURGERY     Can't take any medical procedure in right arm   BREAST BIOPSY     right   BREAST SURGERY N/A    [employer]rees[employer] 04/22/2020   CATARACT EXTRACTION W/ INTRAOCULAR LENS  IMPLANT, BILATERAL  2009-2010   CESAREAN SECTION  1973; 1978; 1981   CESAREAN SECTION N/A    [employer] 04/22/2020   COLONOSCOPY WITH PROPOFOL N/A 10/29/2016   Procedure: COLONOSCOPY WITH PROPOFOL;  Surgeon: West BaliSandi L Fields, MD;  Location: [facility];  Service: Endoscopy;  Laterality: N/A;  10:00 am   CYSTOSCOPY Left 04/13/2014   Procedure: CYSTOSCOPY FLEXIBLE;  Surgeon: Crist FatBenjamin W Herrick, MD;  Location: [facility];  Service: Urology;  Laterality: Left;  with STENT   ESOPHAGOGASTRODUODENOSCOPY (EGD) WITH PROPOFOL N/A 10/29/2016   Procedure: ESOPHAGOGASTRODUODENOSCOPY (EGD) WITH PROPOFOL;  Surgeon: West Bali, MD;  Location: [facility];  Service: Endoscopy;  Laterality: N/A;   EYE SURGERY     implant and screws put in the right lower jaw  11/2008   dental surgery   kidney blockage  ?1990's   " major surgery ;put kidney on pump for awhile"   LITHOTRIPSY     "several times"   LUMBAR FUSION  08/2010   MASTECTOMY  1997   right   NEPHROLITHOTOMY Left 04/13/2014   Procedure: LEFT PERCUTANEOUS NEPHROLITHOTOMY WITH SURGEON ACCESS;  Surgeon: Crist Fat, MD;  Location: [facility];  Service: Urology;  Laterality: Left;   OVARIAN CYST SURGERY     PARATHYROIDECTOMY  02/26/2012   Procedure: PARATHYROIDECTOMY;  Surgeon: Darletta Moll, MD;  Location: [facility] OR;  Service: ENT;;   SAVORY DILATION N/A 10/29/2016   Procedure: SAVORY DILATION;  Surgeon: West Bali, MD;  Location: [facility];  Service: Endoscopy;  Laterality: N/A;    SPINE SURGERY  2011   urological surgery for blocked ureter secondary to kidney stone     Patient Active Problem List   Diagnosis Date Noted   Gait abnormality 12/19/2022   Peripheral neuropathy 09/17/2022   Right foot pain 05/02/2022   Hypokalemia 05/02/2022   Overactive bladder 02/08/2022   Left knee pain 02/08/2022   Right leg swelling 02/08/2022   Encounter for general adult medical examination with abnormal findings 08/30/2021   Chronic fatigue 08/30/2021   Vitamin B12 deficiency 08/30/2021   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   Balance problem 12/26/2020   Thyroid nodule 01/16/2018   Overweight (BMI 25.0-29.9) 12/18/2017   Diabetic neuropathy 10/13/2017   Genetic testing 11/28/2016   Esophageal dysphagia 09/30/2016   Ventral hernia 07/28/2015   Situational anxiety 04/26/2015   Thyromegaly 01/23/2015   Iron deficiency 06/17/2014   DDD (degenerative disc disease), lumbar 01/26/2014   Bulge of lumbar disc without myelopathy 01/26/2014   Renal cyst 01/26/2014   Back pain with left-sided sciatica 12/28/2013   Uncontrolled type 2 diabetes mellitus with hyperglycemia, without long-term current use of insulin 08/01/2013   GASTROPARESIS 12/15/2008   GERD (gastroesophageal reflux disease) 06/21/2008   IBS (irritable bowel syndrome) 06/21/2008   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 02/09/2008   Hyperlipidemia 02/09/2008   Essential hypertension 02/09/2008   Insomnia 02/09/2008    ONSET DATE: May 2023:  Referral date 11-27-22  REFERRING DIAG: R26.9 (ICD-10-CM) - Gait abnormality W19.XXXD (ICD-10-CM) - Fall, subsequent encounter  THERAPY DIAG:  Muscle weakness (generalized)  Other abnormalities of gait and mobility  Rationale for Evaluation and Treatment: Rehabilitation  SUBJECTIVE:  SUBJECTIVE STATEMENT: Pt reports the pain in her Rt leg is much less than it was last Thursday; pt reports she has been using RW (not SPC) to ambulate in her home and has used it to do a lot of walking on her deck; has not done a lot of exercise due to the pain, just been trying to walk a lot because it is less painful; feels much better overall  Pt accompanied by: self  PERTINENT HISTORY: This is a 77 year old woman with past medical history of breast cancer, s/p chemotherapy, hypertension, diabetes, who is presenting with multiple falls. Patient reports since May of last year, she has a total of 6 falls resulting in torn ligament, and bruises. She denies any prodromes prior to the fall, no warning signs, no dizziness, no lightheadedness  PAIN:  Are you having pain? Yes: NPRS scale: 4-5/10 Pain location: Rt quads Aggravating factors: no specific factors Relieving factors: ice and Advil  PRECAUTIONS: Fall  WEIGHT BEARING RESTRICTIONS: No  FALLS: Has patient fallen in last 6 months? Yes. Number of falls approx. 3   LIVING ENVIRONMENT: Lives with: lives with their spouse and lives with their daughter Lives in: House/apartment Stairs: Yes: Internal: 12 steps; on right going up and External: 1 steps; none pt does not have to go up to 2nd level Has following equipment at home: Single point cane and Walker - 2 wheeled  PLOF: Independent  PATIENT GOALS: improve walking and leg strength  OBJECTIVE:    GAIT: Gait pattern:  decreased push off due to gastroc weakness LLE and step through pattern Distance walked: 100'  Assistive device utilized: RW Level of assistance: SBA Comments: continues to have some pain & difficulty with lifting RLE in seated position (due to painful active hip flexion)      THEREX:   Seated LAQ's RLE 2 sets 10 reps Supine position - hip flexion/extension in hooklying position 10 reps with assist to reduce pain with hip flexion Bridging 10 reps SAQ's RLE in  supine - 10 reps x 2 sets Sidelying position - Rt hip flexion/extension with RLE supported by PT 10 reps Clam shell RLE in Lt sidelying position 10 reps Hip abduction with knee extended in sidelying position 10 reps  Standing position - used RW for UE support:  attempted small range hip flexion RLE 10 reps x 2 sets  Stepping forward/back RLE small range for increased comfort/decreased pain 10 reps with bil. UE suppport on RW  Discussed need to continue walking as much as tolerated with use of RW; discussed benefit/need for aquatic therapy for increased comfort with AROM with decreased pain by reducing stress and strain on joints    12-18-22: Pt instructed in HEP for bil. LE strengthening - Med[employer] HEP [email or web address]; pt performed exercises listed below 10 reps each leg; resistance used was red theraband  Access Code: [email or web address] URL: [email or web address] Date: 12/18/2022 Prepared by: Maebelle Munroe  Exercises - Supine Bridge  - 1 x daily - 7 x weekly - 1 sets - 10 reps - 5 sec hold - Single Leg Bridge  - 1 x daily - 7 x weekly - 1 sets - 10 reps - 3 sec hold - Sidelying Hip Abduction  - 1 x daily - 7 x weekly - 3 sets - 10 reps - Clamshell  - 1 x daily - 7 x weekly - 1 sets - 10 reps - Supine Hip Flexion  - 1 x daily - 7 x weekly - 1 sets -  10 reps - 3 sec hold - Hooklying Clamshell with Resistance  - 1 x daily - 7 x weekly - 1 sets - 10 reps - Standing Hip Extension with Resistance at Ankles and Counter Support  - 1 x daily - 7 x weekly - 1 sets - 10 reps - 3 sec hold hold - Sit to Stand  - 1 x daily - 7 x weekly - 1 sets - 10 reps      PATIENT EDUCATION: Education details: [employer] HEP;   12-24-22 - added piriformis stretch and heel raises to HEP  Person educated: Patient Education method: Explanation Education comprehension: verbalized understanding  HOME EXERCISE PROGRAM: To be established  GOALS: Goals reviewed with patient? Yes   SHORT TERM GOALS: Target date:  01-10-23  Improve 5x sit to stand score to </= 23 secs with RUE support from standard chair. Baseline:  28.06 secs Goal status: INITIAL  2.  Amb. 250' with RW with SBA for increased community accessibility.  Baseline:  Goal status: INITIAL  3. Improve gait velocity by at least .3 ft/sec with use of SPC for increased gait efficiency. Baseline:  Goal status: INITIAL  4.  Improve TUG score to </= 19 secs with SPC for reduced fall risk. Baseline: 22,31 secs with SPC Goal status: INITIAL  5.  Independent in HEP for bil. LE strengthening and balance exercises.  Baseline:  Goal status: INITIAL   LONG TERM GOALS: Target date: 02-07-23  Improve 5x sit to stand score to </= 20 secs with RUE support from standard chair. Baseline:  28.06 secs Goal status: INITIAL  2.  Amb. 460' with RW with SBA for increased community accessibility.  Baseline:  Goal status: INITIAL  3.   Improve gait velocity by at least .6 ft/sec with use of SPC for increased gait efficiency. Baseline:  Goal status: INITIAL  4.   Improve TUG score to </= 15 secs with SPC for reduced fall risk. Baseline: 22.31 secs Goal status: INITIAL  5.  Independent in updated HEP for balance and strengthening. Baseline:  Goal status: INITIAL    ASSESSMENT:  CLINICAL IMPRESSION: PT session focused on gentle, low impact strengthening and AROM RLE with pt reporting continued pain with active Rt hip flexion, but much less intense pain than reported in previous PT session last Thursday.  Pt continues to need and use RW for assistance with gait to reduce pain with active Rt hip flexion.  Pt reports increased dizziness at times, which she thinks may be due to the increased pain in RLE.   Pt agrees with aquatic PT - plan to start this treatment on 01-01-23.  Cont with POC.     OBJECTIVE IMPAIRMENTS: Abnormal gait, decreased activity tolerance, decreased balance, decreased strength, dizziness, impaired sensation, and pain.    ACTIVITY LIMITATIONS: carrying, bending, standing, squatting, stairs, and locomotion level  PARTICIPATION LIMITATIONS: meal prep, cleaning, laundry, driving, shopping, and community activity  PERSONAL FACTORS: Past/current experiences, Time since onset of injury/illness/exacerbation, and unknown etiology of falls  are also affecting patient's functional outcome.   REHAB POTENTIAL: Good  CLINICAL DECISION MAKING: Evolving/moderate complexity  EVALUATION COMPLEXITY: Moderate  PLAN:  PT FREQUENCY: 2x/week  PT DURATION: 8 weeks  PLANNED INTERVENTIONS: Therapeutic exercises, Therapeutic activity, Neuromuscular re-education, Balance training, Gait training, Patient/Family education, Self Care, Stair training, Vestibular training, DME instructions, and Aquatic Therapy  PLAN FOR NEXT SESSION: do orthostatic assessment;  continue strengthening exercises   Iyauna Sing, Donavan Burnet, PT 12/31/2022, 8:33 AM

## 2022-12-30 NOTE — Patient Instructions (Signed)
  Aquatic Therapy: What to Expect!  Where:  [facility] [address] [place], [place]  [address] [phone]  NOTE:  You will receive an automated phone message reminding you of your appointment and it will say the appointment is at the Rehab Center on [address]  We are working to fix this- just know that you will meet us at the pool!  How to Prepare: Please make sure you drink 8 ounces of water about one hour prior to your pool session A caregiver MUST attend the entire session with the patient.  The caregiver will be responsible for assisting with dressing as well as any toileting needs.  If the patient will be doing a home program this should likely be the person who will assist as well.  Patients must wear either their street shoes or pool shoes until they are ready to enter the pool with the therapist.  Patients must also wear either street shoes or pool shoes once exiting the pool to walk to the locker room.  This will helps us prevent slips and falls.  Please arrive 15 minutes early to prepare for your pool therapy session Sign in at the front desk on the clipboard marked for [place] You may use the locker rooms on your right and then enter directly into the recreation pool (NOT the competition pool) Please make sure to attend to any toileting needs prior to entering the pool Please be dressed in your swim suit and on the pool deck at least 5 minutes before your appointment Once on the pool deck your therapist will ask you to sign the Patient  Consent and Assignment of Benefits form Your therapist may take your blood pressure prior to, during and after your session if indicated  About the pool  and parking: Entering the pool Your therapist will assist you; there are 2 ways to enter:  stairs with railings or with a chair lift.   Your therapist will determine the most appropriate way for you. Water temperature is usually between 86-87 degrees There  may be other swimmers in the pool at the same time Parking is free.   Contact Info:     Appointments: [facility]  All sessions are 45 minutes   [address]  [address]     Please call the [facility] if   [place], [place]   [address]    you need to cancel or reschedule an appointment.  [phone]       

## 2022-12-31 ENCOUNTER — Encounter: Payer: Self-pay | Admitting: Physical Therapy

## 2023-01-01 ENCOUNTER — Ambulatory Visit: Payer: Medicare Other | Admitting: Physical Therapy

## 2023-01-01 DIAGNOSIS — M6281 Muscle weakness (generalized): Secondary | ICD-10-CM | POA: Diagnosis not present

## 2023-01-01 DIAGNOSIS — R2681 Unsteadiness on feet: Secondary | ICD-10-CM

## 2023-01-01 DIAGNOSIS — R2689 Other abnormalities of gait and mobility: Secondary | ICD-10-CM

## 2023-01-01 NOTE — Therapy (Signed)
OUTPATIENT PHYSICAL THERAPY NEURO TREATMENT NOTE/AQUATIC THERAPY   Patient Name: [name] MRN: [id number] DOB:May 21, 1946, 77 y.o., female Today's Date: 01/01/2023   PCP: Anabel Halon, MD REFERRING PROVIDER: Windell Norfolk, MD  END OF SESSION:  PT End of Session - 01/01/23 1851     Visit Number 6    Number of Visits 17    Date for PT Re-Evaluation 02/07/23    Authorization Type [employer]    Authorization Time Period 12-09-22 - 02-21-23    PT Start Time 1400    PT Stop Time 1445    PT Time Calculation (min) 45 min    Equipment Utilized During Treatment Other (comment)   pool noodle   Activity Tolerance Patient tolerated treatment well    Behavior During Therapy [facility] for tasks assessed/performed                 Past Medical History:  Diagnosis Date   (HFpEF) heart failure with preserved ejection fraction    Abdominal hernia 02/26/2012   unrepaired   Adenocarcinoma of breast 1997   right / chemo + tamoxifen x 5 years    Anemia    Anxiety    Aortic atherosclerosis    Arthritis    Blood transfusion 1980's   Breast cancer    Cancer    [employer] 08/18/2020   Complication of anesthesia    has a hard time waking up; can't lay flat   Degenerative disc disease, lumbar    pressing on L3 and L4   Diabetes mellitus 1989   Type 2 NIDDM; "cancer treatment gave me diabetes"   Diabetes mellitus without complication    [employer] 08/18/2020   Diverticulosis    DJD (degenerative joint disease) of lumbar spine    Dysrhythmia    "irregular"   Exertional dyspnea    Gastroparesis    GERD (gastroesophageal reflux disease)    mann   Heart murmur    "think I outgrew it"   Hepatic steatosis    History of kidney stones    History of stomach ulcers    Hx: UTI (urinary tract infection)    Hyperlipidemia    Hypersensitivity    in tongue   Hypertension    IBS (irritable bowel syndrome)    Insomnia    Internal hemorrhoids    Kidney cysts    RT KIDNEY   Kidney  stones 2009   s/p lithotripsy   Mild CAD    Mild valvular heart disease    Nephrolithiasis 04/13/2014   Obesity    PFO (patent foramen ovale)    PONV (postoperative nausea and vomiting)    Pulmonary nodule    Shortness of breath    unable to lie flat   Ventral hernia    Past Surgical History:  Procedure Laterality Date   ABDOMINAL HYSTERECTOMY  2002   APPENDECTOMY     BACK SURGERY     Can't take any medical procedure in right arm   BREAST BIOPSY     right   BREAST SURGERY N/A    [employer] 04/22/2020   CATARACT EXTRACTION W/ INTRAOCULAR LENS  IMPLANT, BILATERAL  2009-2010   CESAREAN SECTION  1973; 1978; 1981   CESAREAN SECTION N/A    [employer] 04/22/2020   COLONOSCOPY WITH PROPOFOL N/A 10/29/2016   Procedure: COLONOSCOPY WITH PROPOFOL;  Surgeon: West Bali, MD;  Location: [facility];  Service: Endoscopy;  Laterality: N/A;  10:00 am   CYSTOSCOPY Left 04/13/2014   Procedure:  CYSTOSCOPY FLEXIBLE;  Surgeon: Crist FatBenjamin W Herrick, MD;  Location: [facility];  Service: Urology;  Laterality: Left;  with STENT   ESOPHAGOGASTRODUODENOSCOPY (EGD) WITH PROPOFOL N/A 10/29/2016   Procedure: ESOPHAGOGASTRODUODENOSCOPY (EGD) WITH PROPOFOL;  Surgeon: West BaliSandi L Fields, MD;  Location: [facility];  Service: Endoscopy;  Laterality: N/A;   EYE SURGERY     implant and screws put in the right lower jaw  11/2008   dental surgery   kidney blockage  ?1990's   " major surgery ;put kidney on pump for awhile"   LITHOTRIPSY     "several times"   LUMBAR FUSION  08/2010   MASTECTOMY  1997   right   NEPHROLITHOTOMY Left 04/13/2014   Procedure: LEFT PERCUTANEOUS NEPHROLITHOTOMY WITH SURGEON ACCESS;  Surgeon: Crist FatBenjamin W Herrick, MD;  Location: [facility];  Service: Urology;  Laterality: Left;   OVARIAN CYST SURGERY     PARATHYROIDECTOMY  02/26/2012   Procedure: PARATHYROIDECTOMY;  Surgeon: Darletta MollSui W Teoh, MD;  [facility];  Service: ENT;;   SAVORY DILATION N/A 10/29/2016   Procedure: SAVORY DILATION;  Surgeon: West BaliSandi L  Fields, MD;  [facility]: [facility];  Service: Endoscopy;  Laterality: N/A;   SPINE SURGERY  2011   urological surgery for blocked ureter secondary to kidney stone     Patient Active Problem List   Diagnosis Date Noted   Gait abnormality 12/19/2022   Peripheral neuropathy 09/17/2022   Right foot pain 05/02/2022   Hypokalemia 05/02/2022   Overactive bladder 02/08/2022   Left knee pain 02/08/2022   Right leg swelling 02/08/2022   Encounter for general adult medical examination with abnormal findings 08/30/2021   Chronic fatigue 08/30/2021   Vitamin B12 deficiency 08/30/2021   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   Balance problem 12/26/2020   Thyroid nodule 01/16/2018   Overweight (BMI 25.0-29.9) 12/18/2017   Diabetic neuropathy 10/13/2017   Genetic testing 11/28/2016   Esophageal dysphagia 09/30/2016   Ventral hernia 07/28/2015   Situational anxiety 04/26/2015   Thyromegaly 01/23/2015   Iron deficiency 06/17/2014   DDD (degenerative disc disease), lumbar 01/26/2014   Bulge of lumbar disc without myelopathy 01/26/2014   Renal cyst 01/26/2014   Back pain with left-sided sciatica 12/28/2013   Uncontrolled type 2 diabetes mellitus with hyperglycemia, without long-term current use of insulin 08/01/2013   GASTROPARESIS 12/15/2008   GERD (gastroesophageal reflux disease) 06/21/2008   IBS (irritable bowel syndrome) 06/21/2008   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 02/09/2008   Hyperlipidemia 02/09/2008   Essential hypertension 02/09/2008   Insomnia 02/09/2008    ONSET DATE: May 2023:  Referral date 11-27-22  REFERRING DIAG: R26.9 (ICD-10-CM) - Gait abnormality W19.XXXD (ICD-10-CM) - Fall, subsequent encounter  THERAPY DIAG:  Muscle weakness (generalized)  Other abnormalities of gait and mobility  Unsteadiness on feet  Rationale for Evaluation and Treatment: Rehabilitation  SUBJECTIVE:  SUBJECTIVE STATEMENT: Pt reports the pain in her Rt leg continues to improve; is better today than it was on Monday this week; pt presents for initial aquatic therapy session; continues to use RW for assistance with ambulation Pt accompanied by: self  PERTINENT HISTORY: This is a 77 year old woman with past medical history of breast cancer, s/p chemotherapy, hypertension, diabetes, who is presenting with multiple falls. Patient reports since May of last year, she has a total of 6 falls resulting in torn ligament, and bruises. She denies any problems prior to the fall, no warning signs, no dizziness, no lightheadedness  PAIN:  Are you having pain? Yes: NPRS scale: 4-5/10 Pain location: Rt quads Aggravating factors: no specific factors Relieving factors: ice and Advil  PRECAUTIONS: Fall  WEIGHT BEARING RESTRICTIONS: No  FALLS: Has patient fallen in last 6 months? Yes. Number of falls approx. 3   LIVING ENVIRONMENT: Lives with: lives with their spouse and lives with their daughter Lives in: House/apartment Stairs: Yes: Internal: 12 steps; on right going up and External: 1 steps; none pt does not have to go up to 2nd level Has following equipment at home: Single point cane and Walker - 2 wheeled  PLOF: Independent  PATIENT GOALS: improve walking and leg strength  OBJECTIVE:  Aquatic therapy at [facility]   Patient seen for aquatic therapy today.  Treatment took place in water 3.6-4.5 feet deep depending upon activity.  Pt entered and exited the pool via step negotiation using bil. Hand rails with supervision.  Pt performed water walking for warm up - forwards, backwards, and sideways -- 4 laps (18'x 20 each direction - with pt holding yellow noodle stabilized by PT  Runner's stretch - RLE and LLE - 20 sec hold x 1 rep each leg; gastroc stretch with forefoot on pool wall 20 sec hold x 1  rep  Marching in place 10 reps each leg with UE support on yellow noodle Squats bil. LE's 10 reps  Rt hip AROM exercises - hip flexion, abduction, extension, hip flexion/extension with knee flexed at 90 degrees, hip adduction 15 reps each direction  Marching forwards across pool - 18' x 4 reps  Pt stood with LUE support on pool edge - made circles clockwise 10 reps and then counterclockwise 10 reps with RLE for hip ROM and strengthening  Pt performed stepping strategy exercise- stepped forward/backward 10 reps each leg, then out/in 10 reps each leg and backward/forward 10 reps each leg  Pt amb. 18' x 6 reps at end of session with pt holding yellow noodle with myself, PT, behind for assist with balance as needed; final 2 reps pt pushed and pulled noodle for increased resistance and more dynamic movement during gait  Pt requires buoyancy of water for support for reduced fall risk and for unloading/reduced stress on joints as pt able to tolerate increased standing and ambulation in water compared to that on land; viscosity of water is needed for resistance for strengthening and current of water provides perturbations for challenge for balance training            12-18-22: Pt instructed in HEP for bil. LE strengthening - [facility] HEP [email or web address]; pt performed exercises listed below 10 reps each leg; resistance used was red theraband  Access Code: [email or web address] URL: [email or web address] Date: 12/18/2022 Prepared by: Maebelle Munroe  Exercises - Supine Bridge  - 1 x daily - 7 x weekly - 1 sets - 10 reps - 5 sec hold - Single Leg Bridge  -  1 x daily - 7 x weekly - 1 sets - 10 reps - 3 sec hold - Sidelying Hip Abduction  - 1 x daily - 7 x weekly - 3 sets - 10 reps - Clamshell  - 1 x daily - 7 x weekly - 1 sets - 10 reps - Supine Hip Flexion  - 1 x daily - 7 x weekly - 1 sets - 10 reps - 3 sec hold - Hooklying Clamshell with Resistance  - 1 x daily - 7 x weekly - 1 sets - 10 reps -  Standing Hip Extension with Resistance at Ankles and Counter Support  - 1 x daily - 7 x weekly - 1 sets - 10 reps - 3 sec hold hold - Sit to Stand  - 1 x daily - 7 x weekly - 1 sets - 10 reps      PATIENT EDUCATION: Education details: [facility] HEP;   12-24-22 - added piriformis stretch and heel raises to HEP  Person educated: Patient Education method: Explanation Education comprehension: verbalized understanding  HOME EXERCISE PROGRAM: To be established  GOALS: Goals reviewed with patient? Yes   SHORT TERM GOALS: Target date: 01-10-23  Improve 5x sit to stand score to </= 23 secs with RUE support from standard chair. Baseline:  28.06 secs Goal status: INITIAL  2.  Amb. 250' with RW with SBA for increased community accessibility.  Baseline:  Goal status: INITIAL  3. Improve gait velocity by at least .3 ft/sec with use of SPC for increased gait efficiency. Baseline:  Goal status: INITIAL  4.  Improve TUG score to </= 19 secs with SPC for reduced fall risk. Baseline: 22,31 secs with SPC Goal status: INITIAL  5.  Independent in HEP for bil. LE strengthening and balance exercises.  Baseline:  Goal status: INITIAL   LONG TERM GOALS: Target date: 02-07-23  Improve 5x sit to stand score to </= 20 secs with RUE support from standard chair. Baseline:  28.06 secs Goal status: INITIAL  2.  Amb. 460' with RW with SBA for increased community accessibility.  Baseline:  Goal status: INITIAL  3.   Improve gait velocity by at least .6 ft/sec with use of SPC for increased gait efficiency. Baseline:  Goal status: INITIAL  4.   Improve TUG score to </= 15 secs with SPC for reduced fall risk. Baseline: 22.31 secs Goal status: INITIAL  5.  Independent in updated HEP for balance and strengthening. Baseline:  Goal status: INITIAL    ASSESSMENT:  CLINICAL IMPRESSION: Aquatic PT session focused on gait training with UE support on pool noodle and on gentle Rt hip AROM exercises  to reduce pain with active hip flexion as pt has most pain with this movement on land.  Pt able to tolerate much more activity in aquatic environment  with less c/o pain than that experienced on land.  Increased AAROM Rt hip flexion and extension achieved with buoyancy assisted exercises, with pt reporting minimal pain with these exercises in standing position.  Pt tolerated aquatic exercises well with pt reporting Rt leg felt better at end of session than it did at start of session.  Cont with POC.     OBJECTIVE IMPAIRMENTS: Abnormal gait, decreased activity tolerance, decreased balance, decreased strength, dizziness, impaired sensation, and pain.   ACTIVITY LIMITATIONS: carrying, bending, standing, squatting, stairs, and locomotion level  PARTICIPATION LIMITATIONS: meal prep, cleaning, laundry, driving, shopping, and community activity  PERSONAL FACTORS: Past/current experiences, Time since onset of injury/illness/exacerbation, and  unknown etiology of falls  are also affecting patient's functional outcome.   REHAB POTENTIAL: Good  CLINICAL DECISION MAKING: Evolving/moderate complexity  EVALUATION COMPLEXITY: Moderate  PLAN:  PT FREQUENCY: 2x/week  PT DURATION: 8 weeks  PLANNED INTERVENTIONS: Therapeutic exercises, Therapeutic activity, Neuromuscular re-education, Balance training, Gait training, Patient/Family education, Self Care, Stair training, Vestibular training, DME instructions, and Aquatic Therapy  PLAN FOR NEXT SESSION: how did she feel after pool session? do orthostatic assessment if pt c/o dizziness; continue strengthening exercises   Nekoda Chock, Donavan Burnet, PT 01/01/2023, 6:54 PM

## 2023-01-02 ENCOUNTER — Ambulatory Visit: Payer: Medicare Other | Admitting: Physical Therapy

## 2023-01-06 ENCOUNTER — Ambulatory Visit: Payer: Medicare Other | Admitting: Physical Therapy

## 2023-01-06 DIAGNOSIS — M6281 Muscle weakness (generalized): Secondary | ICD-10-CM | POA: Diagnosis not present

## 2023-01-06 DIAGNOSIS — R2689 Other abnormalities of gait and mobility: Secondary | ICD-10-CM

## 2023-01-06 DIAGNOSIS — R2681 Unsteadiness on feet: Secondary | ICD-10-CM

## 2023-01-06 NOTE — Therapy (Unsigned)
OUTPATIENT PHYSICAL THERAPY NEURO TREATMENT NOTE   Patient Name: [name] MRN: [id number] DOB:27-Apr-1946, 77 y.o., female Today's Date: 01/07/2023   PCP: Anabel Halon, MD REFERRING PROVIDER: Windell Norfolk, MD  END OF SESSION:  PT End of Session - 01/07/23 2006     Visit Number 7    Number of Visits 17    Date for PT Re-Evaluation 02/07/23    Authorization Type [employer]    Authorization Time Period 12-09-22 - 02-21-23    PT Start Time 0932    PT Stop Time 1020    PT Time Calculation (min) 48 min    Equipment Utilized During Treatment --    Activity Tolerance Patient tolerated treatment well    Behavior During Therapy [facility] for tasks assessed/performed                  Past Medical History:  Diagnosis Date   (HFpEF) heart failure with preserved ejection fraction    Abdominal hernia 02/26/2012   unrepaired   Adenocarcinoma of breast 1997   right / chemo + tamoxifen x 5 years    Anemia    Anxiety    Aortic atherosclerosis    Arthritis    Blood transfusion 1980's   Breast cancer    Cancer    [employer]rees[employer] 08/18/2020   Complication of anesthesia    has a hard time waking up; can't lay flat   Degenerative disc disease, lumbar    pressing on L3 and L4   Diabetes mellitus 1989   Type 2 NIDDM; "cancer treatment gave me diabetes"   Diabetes mellitus without complication    [employer] 08/18/2020   Diverticulosis    DJD (degenerative joint disease) of lumbar spine    Dysrhythmia    "irregular"   Exertional dyspnea    Gastroparesis    GERD (gastroesophageal reflux disease)    mann   Heart murmur    "think I outgrew it"   Hepatic steatosis    History of kidney stones    History of stomach ulcers    Hx: UTI (urinary tract infection)    Hyperlipidemia    Hypersensitivity    in tongue   Hypertension    IBS (irritable bowel syndrome)    Insomnia    Internal hemorrhoids    Kidney cysts    RT KIDNEY   Kidney stones 2009   s/p lithotripsy   Mild CAD     Mild valvular heart disease    Nephrolithiasis 04/13/2014   Obesity    PFO (patent foramen ovale)    PONV (postoperative nausea and vomiting)    Pulmonary nodule    Shortness of breath    unable to lie flat   Ventral hernia    Past Surgical History:  Procedure Laterality Date   ABDOMINAL HYSTERECTOMY  2002   APPENDECTOMY     BACK SURGERY     Can't take any medical procedure in right arm   BREAST BIOPSY     right   BREAST SURGERY N/A    [employer]rees[employer] 04/22/2020   CATARACT EXTRACTION W/ INTRAOCULAR LENS  IMPLANT, BILATERAL  2009-2010   CESAREAN SECTION  1973; 1978; 1981   CESAREAN SECTION N/A    [employer] 04/22/2020   COLONOSCOPY WITH PROPOFOL N/A 10/29/2016   Procedure: COLONOSCOPY WITH PROPOFOL;  Surgeon: West Bali, MD;  Location: [facility];  Service: Endoscopy;  Laterality: N/A;  10:00 am   CYSTOSCOPY Left 04/13/2014   Procedure: CYSTOSCOPY FLEXIBLE;  Surgeon:  Crist Fat, MD;  Location: [facility];  Service: Urology;  Laterality: Left;  with STENT   ESOPHAGOGASTRODUODENOSCOPY (EGD) WITH PROPOFOL N/A 10/29/2016   Procedure: ESOPHAGOGASTRODUODENOSCOPY (EGD) WITH PROPOFOL;  Surgeon: West Bali, MD;  Location: [facility];  Service: Endoscopy;  Laterality: N/A;   EYE SURGERY     implant and screws put in the right lower jaw  11/2008   dental surgery   kidney blockage  ?1990's   " major surgery ;put kidney on pump for awhile"   LITHOTRIPSY     "several times"   LUMBAR FUSION  08/2010   MASTECTOMY  1997   right   NEPHROLITHOTOMY Left 04/13/2014   Procedure: LEFT PERCUTANEOUS NEPHROLITHOTOMY WITH SURGEON ACCESS;  Surgeon: Crist Fat, MD;  Location: [facility];  Service: Urology;  Laterality: Left;   OVARIAN CYST SURGERY     PARATHYROIDECTOMY  02/26/2012   Procedure: PARATHYROIDECTOMY;  Surgeon: Darletta Moll, MD;  Location: [facility] OR;  Service: ENT;;   SAVORY DILATION N/A 10/29/2016   Procedure: SAVORY DILATION;  Surgeon: West Bali, MD;  Location: [facility];   Service: Endoscopy;  Laterality: N/A;   SPINE SURGERY  2011   urological surgery for blocked ureter secondary to kidney stone     Patient Active Problem List   Diagnosis Date Noted   Gait abnormality 12/19/2022   Peripheral neuropathy 09/17/2022   Right foot pain 05/02/2022   Hypokalemia 05/02/2022   Overactive bladder 02/08/2022   Left knee pain 02/08/2022   Right leg swelling 02/08/2022   Encounter for general adult medical examination with abnormal findings 08/30/2021   Chronic fatigue 08/30/2021   Vitamin B12 deficiency 08/30/2021   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   Balance problem 12/26/2020   Thyroid nodule 01/16/2018   Overweight (BMI 25.0-29.9) 12/18/2017   Diabetic neuropathy 10/13/2017   Genetic testing 11/28/2016   Esophageal dysphagia 09/30/2016   Ventral hernia 07/28/2015   Situational anxiety 04/26/2015   Thyromegaly 01/23/2015   Iron deficiency 06/17/2014   DDD (degenerative disc disease), lumbar 01/26/2014   Bulge of lumbar disc without myelopathy 01/26/2014   Renal cyst 01/26/2014   Back pain with left-sided sciatica 12/28/2013   Uncontrolled type 2 diabetes mellitus with hyperglycemia, without long-term current use of insulin 08/01/2013   GASTROPARESIS 12/15/2008   GERD (gastroesophageal reflux disease) 06/21/2008   IBS (irritable bowel syndrome) 06/21/2008   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 02/09/2008   Hyperlipidemia 02/09/2008   Essential hypertension 02/09/2008   Insomnia 02/09/2008    ONSET DATE: May 2023:  Referral date 11-27-22  REFERRING DIAG: R26.9 (ICD-10-CM) - Gait abnormality W19.XXXD (ICD-10-CM) - Fall, subsequent encounter  THERAPY DIAG:  Muscle weakness (generalized)  Other abnormalities of gait and mobility  Unsteadiness on feet  Rationale for Evaluation and Treatment: Rehabilitation  SUBJECTIVE:  SUBJECTIVE STATEMENT: Pt reports the pool session last week was most helpful in improving flexibility and movement - states she was able to move so much easier after the session and slept really well that night after the pool exercises Pt accompanied by: self  PERTINENT HISTORY: This is a 77 year old woman with past medical history of breast cancer, s/p chemotherapy, hypertension, diabetes, who is presenting with multiple falls. Patient reports since May of last year, she has a total of 6 falls resulting in torn ligament, and bruises. She denies any prodromes prior to the fall, no warning signs, no dizziness, no lightheadedness  PAIN:  Are you having pain? Yes: NPRS scale: 4-5/10 Pain location: Rt quads Aggravating factors: no specific factors Relieving factors: ice and Advil  PRECAUTIONS: Fall  WEIGHT BEARING RESTRICTIONS: No  FALLS: Has patient fallen in last 6 months? Yes. Number of falls approx. 3   LIVING ENVIRONMENT: Lives with: lives with their spouse and lives with their daughter Lives in: House/apartment Stairs: Yes: Internal: 12 steps; on right going up and External: 1 steps; none pt does not have to go up to 2nd level Has following equipment at home: Single point cane and Walker - 2 wheeled  PLOF: Independent  PATIENT GOALS: improve walking and leg strength  OBJECTIVE:    GAIT: Gait pattern:  decreased push off due to gastroc weakness LLE and step through pattern Distance walked: 88' to and from [employer] from mat table - no device used Assistive device utilized: RW used to amb. In and out of clinic; no device used in clinic between exercise locations Level of assistance: SBA Comments: continues to have some pain & difficulty with lifting RLE in seated position (due to painful active hip flexion)   NeuroRe-ed:   TUG score 14.25 secs without device    THEREX:   5x sit to stand from standard chair - with RUE  support - 19.53 secs  Seated LAQ's RLE 2 sets 10 reps - no weight Supine position - hip flexion/extension in hooklying position 10 reps with min assist  Bridging 10 reps SAQ's RLE in supine - 15 repss with 2# weight Sidelying position - Rt hip flexion/extension with RLE supported by PT 10 reps - no weight Clam shell RLE in Lt sidelying position 10 reps with 2# weight Hip abduction with knee extended in sidelying position 10 reps  Standing Rt hip flexion/extension with Rt foot on pillowcase on floor for increased ease with hip flexion - 10 reps x 2 sets   12-18-22: Pt instructed in HEP for bil. LE strengthening - [employer] HEP [email or web address]; pt performed exercises listed below 10 reps each leg; resistance used was red theraband  Access Code: [email or web address] URL: [email or web address] Date: 12/18/2022 Prepared by: Maebelle Munroe  Exercises - Supine Bridge  - 1 x daily - 7 x weekly - 1 sets - 10 reps - 5 sec hold - Single Leg Bridge  - 1 x daily - 7 x weekly - 1 sets - 10 reps - 3 sec hold - Sidelying Hip Abduction  - 1 x daily - 7 x weekly - 3 sets - 10 reps - Clamshell  - 1 x daily - 7 x weekly - 1 sets - 10 reps - Supine Hip Flexion  - 1 x daily - 7 x weekly - 1 sets - 10 reps - 3 sec hold - Hooklying Clamshell with Resistance  - 1 x daily - 7 x weekly - 1 sets - 10 reps - Standing  Hip Extension with Resistance at Ankles and Counter Support  - 1 x daily - 7 x weekly - 1 sets - 10 reps - 3 sec hold hold - Sit to Stand  - 1 x daily - 7 x weekly - 1 sets - 10 reps      PATIENT EDUCATION: Education details: [employer] HEP;   12-24-22 - added piriformis stretch and heel raises to HEP  Person educated: Patient Education method: Explanation Education comprehension: verbalized understanding  HOME EXERCISE PROGRAM: To be established  GOALS: Goals reviewed with patient? Yes   SHORT TERM GOALS: Target date: 01-10-23  Improve 5x sit to stand score to </= 23 secs with RUE support from  standard chair. Baseline:  28.06 secs:   19.53 secs with RUE support  Goal status: Goal met 01-06-23  2.  Amb. 250' with RW with SBA for increased community accessibility.  Baseline:  Goal status: Goal met   3. Improve gait velocity by at least .3 ft/sec with use of SPC for increased gait efficiency. Baseline: 12.97 secs no device on 01-06-23 Goal status: Ongoing - 01-06-23  4.  Improve TUG score to </= 19 secs with SPC for reduced fall risk. Baseline: 22,31 secs with SPC;   14.25 secs Goal status: Goal met 01-06-23  5.  Independent in HEP for bil. LE strengthening and balance exercises.  Baseline:  Goal status: Goal met    LONG TERM GOALS: Target date: 02-07-23  Improve 5x sit to stand score to </= 20 secs with RUE support from standard chair. Baseline:  28.06 secs Goal status: INITIAL  2.  Amb. 460' with RW with SBA for increased community accessibility.  Baseline:  Goal status: INITIAL  3.   Improve gait velocity by at least .6 ft/sec with use of SPC for increased gait efficiency. Baseline:  Goal status: INITIAL  4.   Improve TUG score to </= 15 secs with SPC for reduced fall risk. Baseline: 22.31 secs Goal status: INITIAL  5.  Independent in updated HEP for balance and strengthening. Baseline:  Goal status: INITIAL    ASSESSMENT:  CLINICAL IMPRESSION: PT session focused on STG assessment and strengthening/AROM exercises for Rt hip flexors and quads.  Pt has met 5/5 STG's with pt able to perform TUG and 82m walk test without use of RW in today's session, due to significant decrease in RLE pain with active hip flexion.  Pt is progressing well towards goals.  Cont with POC.     OBJECTIVE IMPAIRMENTS: Abnormal gait, decreased activity tolerance, decreased balance, decreased strength, dizziness, impaired sensation, and pain.   ACTIVITY LIMITATIONS: carrying, bending, standing, squatting, stairs, and locomotion level  PARTICIPATION LIMITATIONS: meal prep, cleaning,  laundry, driving, shopping, and community activity  PERSONAL FACTORS: Past/current experiences, Time since onset of injury/illness/exacerbation, and unknown etiology of falls  are also affecting patient's functional outcome.   REHAB POTENTIAL: Good  CLINICAL DECISION MAKING: Evolving/moderate complexity  EVALUATION COMPLEXITY: Moderate  PLAN:  PT FREQUENCY: 2x/week  PT DURATION: 8 weeks  PLANNED INTERVENTIONS: Therapeutic exercises, Therapeutic activity, Neuromuscular re-education, Balance training, Gait training, Patient/Family education, Self Care, Stair training, Vestibular training, DME instructions, and Aquatic Therapy  PLAN FOR NEXT SESSION:  continue strengthening exercises RLE   Kary Kos, PT 01/07/2023, 8:12 PM

## 2023-01-07 ENCOUNTER — Encounter: Payer: Self-pay | Admitting: Physical Therapy

## 2023-01-08 ENCOUNTER — Ambulatory Visit: Payer: Medicare Other | Admitting: Physical Therapy

## 2023-01-08 DIAGNOSIS — M6281 Muscle weakness (generalized): Secondary | ICD-10-CM

## 2023-01-08 DIAGNOSIS — R2689 Other abnormalities of gait and mobility: Secondary | ICD-10-CM

## 2023-01-08 DIAGNOSIS — R2681 Unsteadiness on feet: Secondary | ICD-10-CM

## 2023-01-09 ENCOUNTER — Encounter: Payer: Self-pay | Admitting: Physical Therapy

## 2023-01-09 ENCOUNTER — Encounter: Payer: Medicare Other | Admitting: Physical Therapy

## 2023-01-09 NOTE — Therapy (Signed)
OUTPATIENT PHYSICAL THERAPY NEURO TREATMENT NOTE/AQUATIC THERAPY   Patient Name: [name] MRN: [id number] DOB:1946/03/26, 77 y.o., female Today's Date: 01/09/2023   PCP: Anabel Halon, MD REFERRING PROVIDER: Windell Norfolk, MD  END OF SESSION:  PT End of Session - 01/09/23 1956     Visit Number 8    Number of Visits 17    Date for PT Re-Evaluation 02/07/23    Authorization Type [employer]    Authorization Time Period 12-09-22 - 02-21-23    PT Start Time 1245    PT Stop Time 1335    PT Time Calculation (min) 50 min    Equipment Utilized During Treatment Other (comment)   pool noodle, aquatic cuffs   Activity Tolerance Patient tolerated treatment well    Behavior During Therapy [facility] for tasks assessed/performed                 Past Medical History:  Diagnosis Date   (HFpEF) heart failure with preserved ejection fraction    Abdominal hernia 02/26/2012   unrepaired   Adenocarcinoma of breast 1997   right / chemo + tamoxifen x 5 years    Anemia    Anxiety    Aortic atherosclerosis    Arthritis    Blood transfusion 1980's   Breast cancer    Cancer    [employer] 08/18/2020   Complication of anesthesia    has a hard time waking up; can't lay flat   Degenerative disc disease, lumbar    pressing on L3 and L4   Diabetes mellitus 1989   Type 2 NIDDM; "cancer treatment gave me diabetes"   Diabetes mellitus without complication    [employer] 08/18/2020   Diverticulosis    DJD (degenerative joint disease) of lumbar spine    Dysrhythmia    "irregular"   Exertional dyspnea    Gastroparesis    GERD (gastroesophageal reflux disease)    mann   Heart murmur    "think I outgrew it"   Hepatic steatosis    History of kidney stones    History of stomach ulcers    Hx: UTI (urinary tract infection)    Hyperlipidemia    Hypersensitivity    in tongue   Hypertension    IBS (irritable bowel syndrome)    Insomnia    Internal hemorrhoids    Kidney cysts    RT  KIDNEY   Kidney stones 2009   s/p lithotripsy   Mild CAD    Mild valvular heart disease    Nephrolithiasis 04/13/2014   Obesity    PFO (patent foramen ovale)    PONV (postoperative nausea and vomiting)    Pulmonary nodule    Shortness of breath    unable to lie flat   Ventral hernia    Past Surgical History:  Procedure Laterality Date   ABDOMINAL HYSTERECTOMY  2002   APPENDECTOMY     BACK SURGERY     Can't take any medical procedure in right arm   BREAST BIOPSY     right   BREAST SURGERY N/A    [employer] 04/22/2020   CATARACT EXTRACTION W/ INTRAOCULAR LENS  IMPLANT, BILATERAL  2009-2010   CESAREAN SECTION  1973; 1978; 1981   CESAREAN SECTION N/A    [employer] 04/22/2020   COLONOSCOPY WITH PROPOFOL N/A 10/29/2016   Procedure: COLONOSCOPY WITH PROPOFOL;  Surgeon: West Bali, MD;  Location: [facility];  Service: Endoscopy;  Laterality: N/A;  10:00 am   CYSTOSCOPY Left 04/13/2014  Procedure: CYSTOSCOPY FLEXIBLE;  Surgeon: Crist Fat, MD;  Location: [facility];  Service: Urology;  Laterality: Left;  with STENT   ESOPHAGOGASTRODUODENOSCOPY (EGD) WITH PROPOFOL N/A 10/29/2016   Procedure: ESOPHAGOGASTRODUODENOSCOPY (EGD) WITH PROPOFOL;  Surgeon: West Bali, MD;  Location: [facility];  Service: Endoscopy;  Laterality: N/A;   EYE SURGERY     implant and screws put in the right lower jaw  11/2008   dental surgery   kidney blockage  ?1990's   " major surgery ;put kidney on pump for awhile"   LITHOTRIPSY     "several times"   LUMBAR FUSION  08/2010   MASTECTOMY  1997   right   NEPHROLITHOTOMY Left 04/13/2014   Procedure: LEFT PERCUTANEOUS NEPHROLITHOTOMY WITH SURGEON ACCESS;  Surgeon: Crist Fat, MD;  Location: [facility];  Service: Urology;  Laterality: Left;   OVARIAN CYST SURGERY     PARATHYROIDECTOMY  02/26/2012   Procedure: PARATHYROIDECTOMY;  Surgeon: Darletta Moll, MD;  Location: [facility];  Service: ENT;;   SAVORY DILATION N/A 10/29/2016   Procedure: SAVORY DILATION;   Surgeon: West Bali, MD;  Location: [facility];  Service: Endoscopy;  Laterality: N/A;   SPINE SURGERY  2011   urological surgery for blocked ureter secondary to kidney stone     Patient Active Problem List   Diagnosis Date Noted   Gait abnormality 12/19/2022   Peripheral neuropathy 09/17/2022   Right foot pain 05/02/2022   Hypokalemia 05/02/2022   Overactive bladder 02/08/2022   Left knee pain 02/08/2022   Right leg swelling 02/08/2022   Encounter for general adult medical examination with abnormal findings 08/30/2021   Chronic fatigue 08/30/2021   Vitamin B12 deficiency 08/30/2021   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   Balance problem 12/26/2020   Thyroid nodule 01/16/2018   Overweight (BMI 25.0-29.9) 12/18/2017   Diabetic neuropathy 10/13/2017   Genetic testing 11/28/2016   Esophageal dysphagia 09/30/2016   Ventral hernia 07/28/2015   Situational anxiety 04/26/2015   Thyromegaly 01/23/2015   Iron deficiency 06/17/2014   DDD (degenerative disc disease), lumbar 01/26/2014   Bulge of lumbar disc without myelopathy 01/26/2014   Renal cyst 01/26/2014   Back pain with left-sided sciatica 12/28/2013   Uncontrolled type 2 diabetes mellitus with hyperglycemia, without long-term current use of insulin 08/01/2013   GASTROPARESIS 12/15/2008   GERD (gastroesophageal reflux disease) 06/21/2008   IBS (irritable bowel syndrome) 06/21/2008   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 02/09/2008   Hyperlipidemia 02/09/2008   Essential hypertension 02/09/2008   Insomnia 02/09/2008    ONSET DATE: May 2023:  Referral date 11-27-22  REFERRING DIAG: R26.9 (ICD-10-CM) - Gait abnormality W19.XXXD (ICD-10-CM) - Fall, subsequent encounter  THERAPY DIAG:  Muscle weakness (generalized)  Other abnormalities of gait and mobility  Unsteadiness on feet  Rationale for Evaluation and Treatment: Rehabilitation  SUBJECTIVE:  SUBJECTIVE STATEMENT: Pt reports the pain in her Rt leg continues to improve; is better today than it was on Monday this week; pt presents for initial aquatic therapy session; continues to use RW for assistance with ambulation Pt accompanied by: self  PERTINENT HISTORY: This is a 77 year old woman with past medical history of breast cancer, s/p chemotherapy, hypertension, diabetes, who is presenting with multiple falls. Patient reports since May of last year, she has a total of 6 falls resulting in torn ligament, and bruises. She denies any problems prior to the fall, no warning signs, no dizziness, no lightheadedness  PAIN:  Are you having pain? Yes: NPRS scale: 4-5/10 Pain location: Rt quads Aggravating factors: no specific factors Relieving factors: ice and Advil  PRECAUTIONS: Fall  WEIGHT BEARING RESTRICTIONS: No  FALLS: Has patient fallen in last 6 months? Yes. Number of falls approx. 3   LIVING ENVIRONMENT: Lives with: lives with their spouse and lives with their daughter Lives in: House/apartment Stairs: Yes: Internal: 12 steps; on right going up and External: 1 steps; none pt does not have to go up to 2nd level Has following equipment at home: Single point cane and Walker - 2 wheeled  PLOF: Independent  PATIENT GOALS: improve walking and leg strength  OBJECTIVE:  Aquatic therapy at [facility] - pool temp 90 degrees  Patient seen for aquatic therapy today.  Treatment took place in water 3.6-4.5 feet deep depending upon activity.  Pt entered and exited the pool via step negotiation using bil. Hand rails with supervision.  Pt performed water walking for warm up - forwards, backwards, and sideways -- 4 laps (18'x 20 each direction - with pt holding PT's forearms  Runner's stretch - RLE and LLE - 20 sec hold x 1 rep each leg; gastroc stretch with forefoot on pool  wall 20 sec hold x 1 rep  Marching in place 10 reps each leg with UE support on yellow noodle Squats bil. LE's 10 reps  Rt hip AROM exercises - hip flexion, abduction, extension with use of aquatic cuff for increased resistance with the eccentric contraction; hip abdct./adduction with knee flexed at 90 degrees 10 reps with use of aquatic cuff for increased resistance   Marching forwards across pool - 18' x 4 reps  Pt stood with LUE support on pool edge - made circles clockwise 10 reps and then counterclockwise 10 reps with RLE for hip ROM and strengthening  Pt performed stepping strategy exercise- stepped forward/backward 10 reps each leg, then out/in 10 reps each leg and backward/forward 10 reps each leg  Pt amb. 18' x 2 reps at end of session with pt holding bar bells for increased resistance with SBA  Pt requires buoyancy of water for support for reduced fall risk and for unloading/reduced stress on joints as pt able to tolerate increased standing and ambulation in water compared to that on land; viscosity of water is needed for resistance for strengthening and current of water provides perturbations for challenge for balance training            12-18-22: Pt instructed in HEP for bil. LE strengthening - [facility] HEP [email or web address]; pt performed exercises listed below 10 reps each leg; resistance used was red theraband  Access Code: [email or web address] URL: [email or web address] Date: 12/18/2022 Prepared by: Maebelle Munroe  Exercises - Supine Bridge  - 1 x daily - 7 x weekly - 1 sets - 10 reps - 5 sec hold - Single Leg Bridge  - 1 x daily -  7 x weekly - 1 sets - 10 reps - 3 sec hold - Sidelying Hip Abduction  - 1 x daily - 7 x weekly - 3 sets - 10 reps - Clamshell  - 1 x daily - 7 x weekly - 1 sets - 10 reps - Supine Hip Flexion  - 1 x daily - 7 x weekly - 1 sets - 10 reps - 3 sec hold - Hooklying Clamshell with Resistance  - 1 x daily - 7 x weekly - 1 sets - 10 reps - Standing  Hip Extension with Resistance at Ankles and Counter Support  - 1 x daily - 7 x weekly - 1 sets - 10 reps - 3 sec hold hold - Sit to Stand  - 1 x daily - 7 x weekly - 1 sets - 10 reps      PATIENT EDUCATION: Education details: [facility] HEP;   12-24-22 - added piriformis stretch and heel raises to HEP  Person educated: Patient Education method: Explanation Education comprehension: verbalized understanding  HOME EXERCISE PROGRAM: To be established  GOALS: Goals reviewed with patient? Yes   SHORT TERM GOALS: Target date: 01-10-23   Improve 5x sit to stand score to </= 23 secs with RUE support from standard chair. Baseline:  28.06 secs:   19.53 secs with RUE support  Goal status: Goal met 01-06-23  2.  Amb. 250' with RW with SBA for increased community accessibility.  Baseline:  Goal status: Goal met   3. Improve gait velocity by at least .3 ft/sec with use of SPC for increased gait efficiency. Baseline: 12.97 secs no device on 01-06-23 Goal status: Ongoing - 01-06-23  4.  Improve TUG score to </= 19 secs with SPC for reduced fall risk. Baseline: 22,31 secs with SPC;   14.25 secs Goal status: Goal met 01-06-23  5.  Independent in HEP for bil. LE strengthening and balance exercises.  Baseline:  Goal status: Goal met    LONG TERM GOALS: Target date: 02-07-23  Improve 5x sit to stand score to </= 20 secs with RUE support from standard chair. Baseline:  28.06 secs Goal status: INITIAL  2.  Amb. 460' with RW with SBA for increased community accessibility.  Baseline:  Goal status: INITIAL  3.   Improve gait velocity by at least .6 ft/sec with use of SPC for increased gait efficiency. Baseline:  Goal status: INITIAL  4.   Improve TUG score to </= 15 secs with SPC for reduced fall risk. Baseline: 22.31 secs Goal status: INITIAL  5.  Independent in updated HEP for balance and strengthening. Baseline:  Goal status: INITIAL    ASSESSMENT:  CLINICAL IMPRESSION: Aquatic  PT session focused on gait training with UE support with use of bar bells and on ROM and strengthening exercises for RLE.  Used aquatic cuff on RLE in today's session for increased AROM with concentric contraction and for increased resistance with the eccentric contraction.  Pt tolerated exercises well and demonstrated improved standing balance as session progressed, with pt amb. Across width of pool with bar bells with SBA only at end of session.  Cont with POC.     OBJECTIVE IMPAIRMENTS: Abnormal gait, decreased activity tolerance, decreased balance, decreased strength, dizziness, impaired sensation, and pain.   ACTIVITY LIMITATIONS: carrying, bending, standing, squatting, stairs, and locomotion level  PARTICIPATION LIMITATIONS: meal prep, cleaning, laundry, driving, shopping, and community activity  PERSONAL FACTORS: Past/current experiences, Time since onset of injury/illness/exacerbation, and unknown etiology of falls  are  also affecting patient's functional outcome.   REHAB POTENTIAL: Good  CLINICAL DECISION MAKING: Evolving/moderate complexity  EVALUATION COMPLEXITY: Moderate  PLAN:  PT FREQUENCY: 2x/week  PT DURATION: 8 weeks  PLANNED INTERVENTIONS: Therapeutic exercises, Therapeutic activity, Neuromuscular re-education, Balance training, Gait training, Patient/Family education, Self Care, Stair training, Vestibular training, DME instructions, and Aquatic Therapy  PLAN FOR NEXT SESSION:  do orthostatic assessment if pt c/o dizziness; continue strengthening exercises   Halley Kincer, Donavan Burnet, PT 01/09/2023, 7:59 PM

## 2023-01-11 ENCOUNTER — Other Ambulatory Visit: Payer: Self-pay | Admitting: Family Medicine

## 2023-01-14 ENCOUNTER — Ambulatory Visit: Payer: Medicare Other | Admitting: Physical Therapy

## 2023-01-14 DIAGNOSIS — R2681 Unsteadiness on feet: Secondary | ICD-10-CM

## 2023-01-14 DIAGNOSIS — M6281 Muscle weakness (generalized): Secondary | ICD-10-CM | POA: Diagnosis not present

## 2023-01-14 DIAGNOSIS — R2689 Other abnormalities of gait and mobility: Secondary | ICD-10-CM

## 2023-01-14 NOTE — Therapy (Signed)
OUTPATIENT PHYSICAL THERAPY NEURO TREATMENT NOTE   Patient Name: [name] MRN: [id number] DOB:01-02-1946, 77 y.o., female Today's Date: 01/15/2023   PCP: Anabel Halon, MD REFERRING PROVIDER: Windell Norfolk, MD  END OF SESSION:  PT End of Session - 01/15/23 1058     Visit Number 9    Number of Visits 17    Date for PT Re-Evaluation 02/07/23    Authorization Type [employer]    Authorization Time Period 12-09-22 - 02-21-23    PT Start Time 1315    PT Stop Time 1400    PT Time Calculation (min) 45 min    Equipment Utilized During Treatment Gait belt    Activity Tolerance Patient tolerated treatment well    Behavior During Therapy WFL for tasks assessed/performed                   Past Medical History:  Diagnosis Date   (HFpEF) heart failure with preserved ejection fraction    Abdominal hernia 02/26/2012   unrepaired   Adenocarcinoma of breast 1997   right / chemo + tamoxifen x 5 years    Anemia    Anxiety    Aortic atherosclerosis    Arthritis    Blood transfusion 1980's   Breast cancer    Cancer    [employer] 08/18/2020   Complication of anesthesia    has a hard time waking up; can't lay flat   Degenerative disc disease, lumbar    pressing on L3 and L4   Diabetes mellitus 1989   Type 2 NIDDM; "cancer treatment gave me diabetes"   Diabetes mellitus without complication    [employer] 08/18/2020   Diverticulosis    DJD (degenerative joint disease) of lumbar spine    Dysrhythmia    "irregular"   Exertional dyspnea    Gastroparesis    GERD (gastroesophageal reflux disease)    mann   Heart murmur    "think I outgrew it"   Hepatic steatosis    History of kidney stones    History of stomach ulcers    Hx: UTI (urinary tract infection)    Hyperlipidemia    Hypersensitivity    in tongue   Hypertension    IBS (irritable bowel syndrome)    Insomnia    Internal hemorrhoids    Kidney cysts    RT KIDNEY   Kidney stones 2009   s/p lithotripsy    Mild CAD    Mild valvular heart disease    Nephrolithiasis 04/13/2014   Obesity    PFO (patent foramen ovale)    PONV (postoperative nausea and vomiting)    Pulmonary nodule    Shortness of breath    unable to lie flat   Ventral hernia    Past Surgical History:  Procedure Laterality Date   ABDOMINAL HYSTERECTOMY  2002   APPENDECTOMY     BACK SURGERY     Can't take any medical procedure in right arm   BREAST BIOPSY     right   BREAST SURGERY N/A    [employer] 04/22/2020   CATARACT EXTRACTION W/ INTRAOCULAR LENS  IMPLANT, BILATERAL  2009-2010   CESAREAN SECTION  1973; 1978; 1981   CESAREAN SECTION N/A    [employer] 04/22/2020   COLONOSCOPY WITH PROPOFOL N/A 10/29/2016   Procedure: COLONOSCOPY WITH PROPOFOL;  Surgeon: West Bali, MD;  Location: [facility];  Service: Endoscopy;  Laterality: N/A;  10:00 am   CYSTOSCOPY Left 04/13/2014   Procedure: CYSTOSCOPY FLEXIBLE;  Surgeon: Crist Fat, MD;  Location: [facility];  Service: Urology;  Laterality: Left;  with STENT   ESOPHAGOGASTRODUODENOSCOPY (EGD) WITH PROPOFOL N/A 10/29/2016   Procedure: ESOPHAGOGASTRODUODENOSCOPY (EGD) WITH PROPOFOL;  Surgeon: West Bali, MD;  Location: [facility];  Service: Endoscopy;  Laterality: N/A;   EYE SURGERY     implant and screws put in the right lower jaw  11/2008   dental surgery   kidney blockage  ?1990's   " major surgery ;put kidney on pump for awhile"   LITHOTRIPSY     "several times"   LUMBAR FUSION  08/2010   MASTECTOMY  1997   right   NEPHROLITHOTOMY Left 04/13/2014   Procedure: LEFT PERCUTANEOUS NEPHROLITHOTOMY WITH SURGEON ACCESS;  Surgeon: Crist Fat, MD;  Location: [facility];  Service: Urology;  Laterality: Left;   OVARIAN CYST SURGERY     PARATHYROIDECTOMY  02/26/2012   Procedure: PARATHYROIDECTOMY;  Surgeon: Darletta Moll, MD;  Location: [facility];  Service: ENT;;   SAVORY DILATION N/A 10/29/2016   Procedure: SAVORY DILATION;  Surgeon: West Bali, MD;  Location: [facility];  Service: Endoscopy;  Laterality: N/A;   SPINE SURGERY  2011   urological surgery for blocked ureter secondary to kidney stone     Patient Active Problem List   Diagnosis Date Noted   Gait abnormality 12/19/2022   Peripheral neuropathy 09/17/2022   Right foot pain 05/02/2022   Hypokalemia 05/02/2022   Overactive bladder 02/08/2022   Left knee pain 02/08/2022   Right leg swelling 02/08/2022   Encounter for general adult medical examination with abnormal findings 08/30/2021   Chronic fatigue 08/30/2021   Vitamin B12 deficiency 08/30/2021   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   Balance problem 12/26/2020   Thyroid nodule 01/16/2018   Overweight (BMI 25.0-29.9) 12/18/2017   Diabetic neuropathy 10/13/2017   Genetic testing 11/28/2016   Esophageal dysphagia 09/30/2016   Ventral hernia 07/28/2015   Situational anxiety 04/26/2015   Thyromegaly 01/23/2015   Iron deficiency 06/17/2014   DDD (degenerative disc disease), lumbar 01/26/2014   Bulge of lumbar disc without myelopathy 01/26/2014   Renal cyst 01/26/2014   Back pain with left-sided sciatica 12/28/2013   Uncontrolled type 2 diabetes mellitus with hyperglycemia, without long-term current use of insulin 08/01/2013   GASTROPARESIS 12/15/2008   GERD (gastroesophageal reflux disease) 06/21/2008   IBS (irritable bowel syndrome) 06/21/2008   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 02/09/2008   Hyperlipidemia 02/09/2008   Essential hypertension 02/09/2008   Insomnia 02/09/2008    ONSET DATE: May 2023:  Referral date 11-27-22  REFERRING DIAG: R26.9 (ICD-10-CM) - Gait abnormality W19.XXXD (ICD-10-CM) - Fall, subsequent encounter  THERAPY DIAG:  Unsteadiness on feet  Other abnormalities of gait and mobility  Rationale for Evaluation and Treatment: Rehabilitation  SUBJECTIVE:  SUBJECTIVE STATEMENT: Pt reports her Rt leg is getting much better - still feels a slight pull when she extends her knee but is now able to lift Rt leg in seated position (hip flexion) without much pain; still using RW but was going to use [facility] today but it was in other vehicle Pt accompanied by: self  PERTINENT HISTORY: This is a 77 year old woman with past medical history of breast cancer, s/p chemotherapy, hypertension, diabetes, who is presenting with multiple falls. Patient reports since May of last year, she has a total of 6 falls resulting in torn ligament, and bruises. She denies any prodromes prior to the fall, no warning signs, no dizziness, no lightheadedness  PAIN:  Are you having pain? Yes: NPRS scale: 4-5/10 Pain location: Rt quads Aggravating factors: no specific factors Relieving factors: ice and Advil  PRECAUTIONS: Fall  WEIGHT BEARING RESTRICTIONS: No  FALLS: Has patient fallen in last 6 months? Yes. Number of falls approx. 3   LIVING ENVIRONMENT: Lives with: lives with their spouse and lives with their daughter Lives in: House/apartment Stairs: Yes: Internal: 12 steps; on right going up and External: 1 steps; none pt does not have to go up to 2nd level Has following equipment at home: Single point cane and Walker - 2 wheeled  PLOF: Independent  PATIENT GOALS: improve walking and leg strength  OBJECTIVE:    GAIT: Gait pattern:  decreased push off due to gastroc weakness LLE and step through pattern Distance walked: 115'  without device Assistive device utilized: RW used to amb. In and out of clinic; no device used in clinic between exercise locations Level of assistance: SBA Comments: minimal to no pain reported in RLE; cues to relax arms and allow free swing during gait as pt was initially holding arms in guard position, in elbow flexion  NeuroRe-ed:   Sit to stand with feet on Airex 5 reps from mat without UE support with  SBA Tap ups to 1st step 5 reps each foot without UE support; tap ups to 2nd step with minimal UE support on hand rails 5 reps each LE Pt amb. 35' x 4 reps tossing and catching ball with CGA for multi tasking with gait; amb. Backwards 30' x 2 reps tossing and catch Pt stood on rockerboard inside // bars - 10 reps anterior/posterior weight shift with minimal UE support  Pt performed stepping down to floor and then back up onto rockerboard 5 reps with each LE with minimal UE support Cone taps - standing on floor - to 2 cones - straight ahead 5 reps each foot Stepping over and back of balance beam on floor - inside // bars 5 reps each LE with UE support prn   12-18-22: Pt instructed in HEP for bil. LE strengthening - [employer] HEP [email or web address]; pt performed exercises listed below 10 reps each leg; resistance used was red theraband  Access Code: [email or web address] URL: [email or web address] Date: 12/18/2022 Prepared by: Maebelle Munroe  Exercises - Supine Bridge  - 1 x daily - 7 x weekly - 1 sets - 10 reps - 5 sec hold - Single Leg Bridge  - 1 x daily - 7 x weekly - 1 sets - 10 reps - 3 sec hold - Sidelying Hip Abduction  - 1 x daily - 7 x weekly - 3 sets - 10 reps - Clamshell  - 1 x daily - 7 x weekly - 1 sets - 10 reps - Supine Hip Flexion  - 1 x daily -  7 x weekly - 1 sets - 10 reps - 3 sec hold - Hooklying Clamshell with Resistance  - 1 x daily - 7 x weekly - 1 sets - 10 reps - Standing Hip Extension with Resistance at Ankles and Counter Support  - 1 x daily - 7 x weekly - 1 sets - 10 reps - 3 sec hold hold - Sit to Stand  - 1 x daily - 7 x weekly - 1 sets - 10 reps      PATIENT EDUCATION: Education details: [employer] HEP;   12-24-22 - added piriformis stretch and heel raises to HEP  Person educated: Patient Education method: Explanation Education comprehension: verbalized understanding  HOME EXERCISE PROGRAM: To be established  GOALS: Goals reviewed with patient? Yes   SHORT  TERM GOALS: Target date: 01-10-23  Improve 5x sit to stand score to </= 23 secs with RUE support from standard chair. Baseline:  28.06 secs:   19.53 secs with RUE support  Goal status: Goal met 01-06-23  2.  Amb. 250' with RW with SBA for increased community accessibility.  Baseline:  Goal status: Goal met   3. Improve gait velocity by at least .3 ft/sec with use of SPC for increased gait efficiency. Baseline: 12.97 secs no device on 01-06-23 Goal status: Ongoing - 01-06-23  4.  Improve TUG score to </= 19 secs with SPC for reduced fall risk. Baseline: 22,31 secs with SPC;   14.25 secs Goal status: Goal met 01-06-23  5.  Independent in HEP for bil. LE strengthening and balance exercises.  Baseline:  Goal status: Goal met    LONG TERM GOALS: Target date: 02-07-23  Improve 5x sit to stand score to </= 20 secs with RUE support from standard chair. Baseline:  28.06 secs Goal status: INITIAL  2.  Amb. 460' with RW with SBA for increased community accessibility.  Baseline:  Goal status: INITIAL  3.   Improve gait velocity by at least .6 ft/sec with use of SPC for increased gait efficiency. Baseline:  Goal status: INITIAL  4.   Improve TUG score to </= 15 secs with SPC for reduced fall risk. Baseline: 22.31 secs Goal status: INITIAL  5.  Independent in updated HEP for balance and strengthening. Baseline:  Goal status: INITIAL    ASSESSMENT:  CLINICAL IMPRESSION: PT session focused on standing balance activities and gait training with multi-tasking without device.  Pt reports much less pain in RLE with active knee extension and hip flexion with increased ROM noted.  Pt continues to have gait deviations including decreased arm swing (improvement noted with verbal cues) and decreased step length.  Pt reported amb. Without device required increased focus and concentration to maintain balance.  Pt did well maintaining balance with activities in today's session with only CGA provided  for safety.  Cont with POC.     OBJECTIVE IMPAIRMENTS: Abnormal gait, decreased activity tolerance, decreased balance, decreased strength, dizziness, impaired sensation, and pain.   ACTIVITY LIMITATIONS: carrying, bending, standing, squatting, stairs, and locomotion level  PARTICIPATION LIMITATIONS: meal prep, cleaning, laundry, driving, shopping, and community activity  PERSONAL FACTORS: Past/current experiences, Time since onset of injury/illness/exacerbation, and unknown etiology of falls  are also affecting patient's functional outcome.   REHAB POTENTIAL: Good  CLINICAL DECISION MAKING: Evolving/moderate complexity  EVALUATION COMPLEXITY: Moderate  PLAN:  PT FREQUENCY: 2x/week  PT DURATION: 8 weeks  PLANNED INTERVENTIONS: Therapeutic exercises, Therapeutic activity, Neuromuscular re-education, Balance training, Gait training, Patient/Family education, Self Care, Stair training, Vestibular training,  DME instructions, and Aquatic Therapy  PLAN FOR NEXT SESSION:  10th visit progress note due next session:  continue strengthening exercises RLE and balance training   Oma Marzan, Donavan Burnet, PT 01/15/2023, 11:00 AM

## 2023-01-15 ENCOUNTER — Ambulatory Visit (HOSPITAL_COMMUNITY)
Admission: RE | Admit: 2023-01-15 | Discharge: 2023-01-15 | Disposition: A | Discharge status: Home / Self Care | Payer: Medicare Other | Source: Ambulatory Visit | Attending: Internal Medicine | Admitting: Internal Medicine

## 2023-01-15 ENCOUNTER — Ambulatory Visit: Payer: Medicare Other | Admitting: Physical Therapy

## 2023-01-15 ENCOUNTER — Encounter: Payer: Self-pay | Admitting: Physical Therapy

## 2023-01-15 DIAGNOSIS — R269 Unspecified abnormalities of gait and mobility: Secondary | ICD-10-CM

## 2023-01-15 DIAGNOSIS — M6281 Muscle weakness (generalized): Secondary | ICD-10-CM

## 2023-01-15 DIAGNOSIS — R2681 Unsteadiness on feet: Secondary | ICD-10-CM

## 2023-01-15 DIAGNOSIS — R2689 Other abnormalities of gait and mobility: Secondary | ICD-10-CM

## 2023-01-15 NOTE — Therapy (Signed)
OUTPATIENT PHYSICAL THERAPY NEURO TREATMENT NOTE/AQUATIC THERAPY/10TH VISIT PROGRESS NOTE   Progress Note Reporting Period 12-09-22 to 01-15-23  See note below for Objective Data and Assessment of Progress/Goals.      Patient Name: [name] MRN: [id number] DOB:Aug 24, 1946, 77 y.o., female Today's Date: 01/15/2023   PCP: Anabel Halon, MD REFERRING PROVIDER: Windell Norfolk, MD  END OF SESSION:  PT End of Session - 01/15/23 1954     Visit Number 10    Number of Visits 17    Date for PT Re-Evaluation 02/07/23    Authorization Type [employer]    Authorization Time Period 12-09-22 - 02-21-23    PT Start Time 1240    PT Stop Time 1330    PT Time Calculation (min) 50 min    Equipment Utilized During Treatment Other (comment)   aquatic cuff, bar bell   Activity Tolerance Patient tolerated treatment well    Behavior During Therapy [facility] for tasks assessed/performed                 Past Medical History:  Diagnosis Date   (HFpEF) heart failure with preserved ejection fraction    Abdominal hernia 02/26/2012   unrepaired   Adenocarcinoma of breast 1997   right / chemo + tamoxifen x 5 years    Anemia    Anxiety    Aortic atherosclerosis    Arthritis    Blood transfusion 1980's   Breast cancer    Cancer    [employer] 08/18/2020   Complication of anesthesia    has a hard time waking up; can't lay flat   Degenerative disc disease, lumbar    pressing on L3 and L4   Diabetes mellitus 1989   Type 2 NIDDM; "cancer treatment gave me diabetes"   Diabetes mellitus without complication    [employer] 08/18/2020   Diverticulosis    DJD (degenerative joint disease) of lumbar spine    Dysrhythmia    "irregular"   Exertional dyspnea    Gastroparesis    GERD (gastroesophageal reflux disease)    mann   Heart murmur    "think I outgrew it"   Hepatic steatosis    History of kidney stones    History of stomach ulcers    Hx: UTI (urinary tract infection)    Hyperlipidemia     Hypersensitivity    in tongue   Hypertension    IBS (irritable bowel syndrome)    Insomnia    Internal hemorrhoids    Kidney cysts    RT KIDNEY   Kidney stones 2009   s/p lithotripsy   Mild CAD    Mild valvular heart disease    Nephrolithiasis 04/13/2014   Obesity    PFO (patent foramen ovale)    PONV (postoperative nausea and vomiting)    Pulmonary nodule    Shortness of breath    unable to lie flat   Ventral hernia    Past Surgical History:  Procedure Laterality Date   ABDOMINAL HYSTERECTOMY  2002   APPENDECTOMY     BACK SURGERY     Can't take any medical procedure in right arm   BREAST BIOPSY     right   BREAST SURGERY N/A    [employer] 04/22/2020   CATARACT EXTRACTION W/ INTRAOCULAR LENS  IMPLANT, BILATERAL  2009-2010   CESAREAN SECTION  1973; 1978; 1981   CESAREAN SECTION N/A    [employer] 04/22/2020   COLONOSCOPY WITH PROPOFOL N/A 10/29/2016   Procedure: COLONOSCOPY WITH PROPOFOL;  Surgeon: West Bali, MD;  Location: [facility];  Service: Endoscopy;  Laterality: N/A;  10:00 am   CYSTOSCOPY Left 04/13/2014   Procedure: CYSTOSCOPY FLEXIBLE;  Surgeon: Crist Fat, MD;  Location: [facility];  Service: Urology;  Laterality: Left;  with STENT   ESOPHAGOGASTRODUODENOSCOPY (EGD) WITH PROPOFOL N/A 10/29/2016   Procedure: ESOPHAGOGASTRODUODENOSCOPY (EGD) WITH PROPOFOL;  Surgeon: West Bali, MD;  Location: [facility];  Service: Endoscopy;  Laterality: N/A;   EYE SURGERY     implant and screws put in the right lower jaw  11/2008   dental surgery   kidney blockage  ?1990's   " major surgery ;put kidney on pump for awhile"   LITHOTRIPSY     "several times"   LUMBAR FUSION  08/2010   MASTECTOMY  1997   right   NEPHROLITHOTOMY Left 04/13/2014   Procedure: LEFT PERCUTANEOUS NEPHROLITHOTOMY WITH SURGEON ACCESS;  Surgeon: Crist Fat, MD;  Location: [facility];  Service: Urology;  Laterality: Left;   OVARIAN CYST SURGERY     PARATHYROIDECTOMY  02/26/2012    Procedure: PARATHYROIDECTOMY;  Surgeon: Darletta Moll, MD;  Location: [facility];  Service: ENT;;   SAVORY DILATION N/A 10/29/2016   Procedure: SAVORY DILATION;  Surgeon: West Bali, MD;  Location: [facility];  Service: Endoscopy;  Laterality: N/A;   SPINE SURGERY  2011   urological surgery for blocked ureter secondary to kidney stone     Patient Active Problem List   Diagnosis Date Noted   Gait abnormality 12/19/2022   Peripheral neuropathy 09/17/2022   Right foot pain 05/02/2022   Hypokalemia 05/02/2022   Overactive bladder 02/08/2022   Left knee pain 02/08/2022   Right leg swelling 02/08/2022   Encounter for general adult medical examination with abnormal findings 08/30/2021   Chronic fatigue 08/30/2021   Vitamin B12 deficiency 08/30/2021   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   Balance problem 12/26/2020   Thyroid nodule 01/16/2018   Overweight (BMI 25.0-29.9) 12/18/2017   Diabetic neuropathy 10/13/2017   Genetic testing 11/28/2016   Esophageal dysphagia 09/30/2016   Ventral hernia 07/28/2015   Situational anxiety 04/26/2015   Thyromegaly 01/23/2015   Iron deficiency 06/17/2014   DDD (degenerative disc disease), lumbar 01/26/2014   Bulge of lumbar disc without myelopathy 01/26/2014   Renal cyst 01/26/2014   Back pain with left-sided sciatica 12/28/2013   Uncontrolled type 2 diabetes mellitus with hyperglycemia, without long-term current use of insulin 08/01/2013   GASTROPARESIS 12/15/2008   GERD (gastroesophageal reflux disease) 06/21/2008   IBS (irritable bowel syndrome) 06/21/2008   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 02/09/2008   Hyperlipidemia 02/09/2008   Essential hypertension 02/09/2008   Insomnia 02/09/2008    ONSET DATE: May 2023:  Referral date 11-27-22  REFERRING DIAG: R26.9 (ICD-10-CM) - Gait abnormality W19.XXXD (ICD-10-CM) - Fall, subsequent encounter  THERAPY DIAG:  Unsteadiness on feet  Other abnormalities of gait and mobility  Muscle weakness  (generalized)  Rationale for Evaluation and Treatment: Rehabilitation  SUBJECTIVE:  SUBJECTIVE STATEMENT: Pt reports she is doing better - using SPC for assistance with ambulation and not the RW today; pt states her Rt leg is feeling better Pt accompanied by: self  PERTINENT HISTORY: This is a 77 year old woman with past medical history of breast cancer, s/p chemotherapy, hypertension, diabetes, who is presenting with multiple falls. Patient reports since May of last year, she has a total of 6 falls resulting in torn ligament, and bruises. She denies any problems prior to the fall, no warning signs, no dizziness, no lightheadedness  PAIN:  Are you having pain? Yes: NPRS scale: 2/10 Pain location: Rt quads Aggravating factors: no specific factors Relieving factors: ice and Advil  PRECAUTIONS: Fall  WEIGHT BEARING RESTRICTIONS: No  FALLS: Has patient fallen in last 6 months? Yes. Number of falls approx. 3   LIVING ENVIRONMENT: Lives with: lives with their spouse and lives with their daughter Lives in: House/apartment Stairs: Yes: Internal: 12 steps; on right going up and External: 1 steps; none pt does not have to go up to 2nd level Has following equipment at home: Single point cane and Walker - 2 wheeled  PLOF: Independent  PATIENT GOALS: improve walking and leg strength  OBJECTIVE:  Aquatic therapy at [facility] - pool temp 90 degrees  Patient seen for aquatic therapy today.  Treatment took place in water 3.6-4.5 feet deep depending upon activity.  Pt entered and exited the pool via step negotiation using bil. Hand rails with supervision.  Pt performed water walking for warm up - forwards, backwards, and sideways -- 4 laps (18'x 20 each direction) - pt held large single barbell for assist with  balance and stability  Runner's stretch - RLE  - 20 sec hold x 1 rep each leg; gastroc stretch with forefoot on pool wall 20 sec hold x 1 rep  Marching in place 10 reps each leg with UE support on large barbell  Squats bil. LE's 10 reps  Rt hip AROM exercises - hip flexion, abduction, extension with use of aquatic cuff 15 reps for increased resistance with the eccentric contraction; hip abdct./adduction with knee flexed at 90 degrees 10 reps with use of aquatic cuff for increased resistance; Rt hip adduction/abduction with use of aquatic cuff 10 reps with UE support on pool edge  Marching forwards across pool - 18' x 4 reps  Pt stood with LUE support on pool edge - made circles clockwise 10 reps and then counterclockwise 10 reps with RLE for hip ROM and strengthening  Standing against pool wall in 4' water depth - pt performed Rt knee extension/flexion with aquatic cuff 10 reps x 2 sets; pt performed closed chain strengthening with foot on pool noodle pushing down toward floor 10 reps with assist to hold noodle in place (large yellow noodle used)  Fast walking/modified jogging 18' x 2 reps across pool with CGA to SBA  Pt amb. 18' x 2 reps at end of session with pt holding bar bells for increased resistance with SBA  Pt requires buoyancy of water for support for reduced fall risk and for unloading/reduced stress on joints as pt able to tolerate increased standing and ambulation in water compared to that on land; viscosity of water is needed for resistance for strengthening and current of water provides perturbations for challenge for balance training            12-18-22: Pt instructed in HEP for bil. LE strengthening - [facility] HEP [email or web address]; pt performed exercises listed below 10  reps each leg; resistance used was red theraband  Access Code: [email or web address] URL: [email or web address] Date: 12/18/2022 Prepared by: Maebelle Munroe  Exercises - Supine Bridge  - 1 x daily - 7 x  weekly - 1 sets - 10 reps - 5 sec hold - Single Leg Bridge  - 1 x daily - 7 x weekly - 1 sets - 10 reps - 3 sec hold - Sidelying Hip Abduction  - 1 x daily - 7 x weekly - 3 sets - 10 reps - Clamshell  - 1 x daily - 7 x weekly - 1 sets - 10 reps - Supine Hip Flexion  - 1 x daily - 7 x weekly - 1 sets - 10 reps - 3 sec hold - Hooklying Clamshell with Resistance  - 1 x daily - 7 x weekly - 1 sets - 10 reps - Standing Hip Extension with Resistance at Ankles and Counter Support  - 1 x daily - 7 x weekly - 1 sets - 10 reps - 3 sec hold hold - Sit to Stand  - 1 x daily - 7 x weekly - 1 sets - 10 reps      PATIENT EDUCATION: Education details: [facility] HEP;   12-24-22 - added piriformis stretch and heel raises to HEP  Person educated: Patient Education method: Explanation Education comprehension: verbalized understanding  HOME EXERCISE PROGRAM: To be established  GOALS: Goals reviewed with patient? Yes   SHORT TERM GOALS: Target date: 01-10-23   Improve 5x sit to stand score to </= 23 secs with RUE support from standard chair. Baseline:  28.06 secs:   19.53 secs with RUE support  Goal status: Goal met 01-06-23  2.  Amb. 250' with RW with SBA for increased community accessibility.  Baseline:  Goal status: Goal met   3. Improve gait velocity by at least .3 ft/sec with use of SPC for increased gait efficiency. Baseline: 12.97 secs no device on 01-06-23 Goal status: Ongoing - 01-06-23  4.  Improve TUG score to </= 19 secs with SPC for reduced fall risk. Baseline: 22,31 secs with SPC;   14.25 secs Goal status: Goal met 01-06-23  5.  Independent in HEP for bil. LE strengthening and balance exercises.  Baseline:  Goal status: Goal met    LONG TERM GOALS: Target date: 02-07-23  Improve 5x sit to stand score to </= 20 secs with RUE support from standard chair. Baseline:  28.06 secs Goal status: INITIAL  2.  Amb. 460' with RW with SBA for increased community accessibility.  Baseline:   Goal status: INITIAL  3.   Improve gait velocity by at least .6 ft/sec with use of SPC for increased gait efficiency. Baseline:  Goal status: INITIAL  4.   Improve TUG score to </= 15 secs with SPC for reduced fall risk. Baseline: 22.31 secs Goal status: INITIAL  5.  Independent in updated HEP for balance and strengthening. Baseline:  Goal status: INITIAL    ASSESSMENT:  CLINICAL IMPRESSION: This 10th visit progress note covers dates 12-09-22 - 01-15-23.  Pt has met 5/5 STG's.  Pt used SPC to amb. To today's aquatic PT session rather than using RW which she has been using for past several weeks due to pain in RLE.  Today's aquatic PT session focused on gait training with UE support on large barbell for stability at start of session and on RLE strengthening with use of aquatic cuff.  Pt's balance improved as session progressed and pt acclimated to water  and the perturbations produced by the current.  Pt able to perform hip AROM exercises with minimal difficulty with pt reporting most difficulty with Rt hip adduction in standing with use of aquatic cuff.  Pt is progressing well towards goals.  Cont with POC.     OBJECTIVE IMPAIRMENTS: Abnormal gait, decreased activity tolerance, decreased balance, decreased strength, dizziness, impaired sensation, and pain.   ACTIVITY LIMITATIONS: carrying, bending, standing, squatting, stairs, and locomotion level  PARTICIPATION LIMITATIONS: meal prep, cleaning, laundry, driving, shopping, and community activity  PERSONAL FACTORS: Past/current experiences, Time since onset of injury/illness/exacerbation, and unknown etiology of falls  are also affecting patient's functional outcome.   REHAB POTENTIAL: Good  CLINICAL DECISION MAKING: Evolving/moderate complexity  EVALUATION COMPLEXITY: Moderate  PLAN:  PT FREQUENCY: 2x/week  PT DURATION: 8 weeks  PLANNED INTERVENTIONS: Therapeutic exercises, Therapeutic activity, Neuromuscular re-education,  Balance training, Gait training, Patient/Family education, Self Care, Stair training, Vestibular training, DME instructions, and Aquatic Therapy  PLAN FOR NEXT SESSION:   continue strengthening exercises   Thurmond Hildebran, Donavan Burnet, PT 01/15/2023, 7:56 PM

## 2023-01-16 ENCOUNTER — Encounter: Payer: Medicare Other | Admitting: Physical Therapy

## 2023-01-27 ENCOUNTER — Ambulatory Visit: Payer: Medicare Other | Attending: Neurology | Admitting: Physical Therapy

## 2023-01-27 ENCOUNTER — Encounter: Payer: Self-pay | Admitting: Physical Therapy

## 2023-01-27 DIAGNOSIS — M6281 Muscle weakness (generalized): Secondary | ICD-10-CM

## 2023-01-27 DIAGNOSIS — R2689 Other abnormalities of gait and mobility: Secondary | ICD-10-CM | POA: Diagnosis present

## 2023-01-27 DIAGNOSIS — R2681 Unsteadiness on feet: Secondary | ICD-10-CM | POA: Insufficient documentation

## 2023-01-27 NOTE — Therapy (Signed)
OUTPATIENT PHYSICAL THERAPY NEURO TREATMENT NOTE/AQUATIC THERAPY     Patient Name: [name] MRN: [id number] DOB:10-May-1946, 77 y.o., female Today's Date: 01/27/2023   PCP: Anabel Halon, MD REFERRING PROVIDER: Windell Norfolk, MD  END OF SESSION:  PT End of Session - 01/27/23 2035     Visit Number 11    Number of Visits 17    Date for PT Re-Evaluation 02/07/23    Authorization Type [employer]    Authorization Time Period 12-09-22 - 02-21-23    PT Start Time 1540    PT Stop Time 1630    PT Time Calculation (min) 50 min    Equipment Utilized During Treatment Other (comment)   aquatic cuff, bar bell   Activity Tolerance Patient tolerated treatment well    Behavior During Therapy WFL for tasks assessed/performed                 Past Medical History:  Diagnosis Date   (HFpEF) heart failure with preserved ejection fraction (HCC)    Abdominal hernia 02/26/2012   unrepaired   Adenocarcinoma of breast (HCC) 1997   right / chemo + tamoxifen x 5 years    Anemia    Anxiety    Aortic atherosclerosis (HCC)    Arthritis    Blood transfusion 1980's   Breast cancer (HCC)    Cancer (HCC)    [employer] 08/18/2020   Complication of anesthesia    has a hard time waking up; can't lay flat   Degenerative disc disease, lumbar    pressing on L3 and L4   Diabetes mellitus (HCC) 1989   Type 2 NIDDM; "cancer treatment gave me diabetes"   Diabetes mellitus without complication (HCC)    [employer] 08/18/2020   Diverticulosis    DJD (degenerative joint disease) of lumbar spine    Dysrhythmia    "irregular"   Exertional dyspnea    Gastroparesis    GERD (gastroesophageal reflux disease)    mann   Heart murmur    "think I outgrew it"   Hepatic steatosis    History of kidney stones    History of stomach ulcers    Hx: UTI (urinary tract infection)    Hyperlipidemia    Hypersensitivity    in tongue   Hypertension    IBS (irritable bowel syndrome)    Insomnia    Internal  hemorrhoids    Kidney cysts    RT KIDNEY   Kidney stones 2009   s/p lithotripsy   Mild CAD    Mild valvular heart disease    Nephrolithiasis 04/13/2014   Obesity    PFO (patent foramen ovale)    PONV (postoperative nausea and vomiting)    Pulmonary nodule    Shortness of breath    unable to lie flat   Ventral hernia    Past Surgical History:  Procedure Laterality Date   ABDOMINAL HYSTERECTOMY  2002   APPENDECTOMY     BACK SURGERY     Can't take any medical procedure in right arm   BREAST BIOPSY     right   BREAST SURGERY N/A    [employer] 04/22/2020   CATARACT EXTRACTION W/ INTRAOCULAR LENS  IMPLANT, BILATERAL  2009-2010   CESAREAN SECTION  1973; 1978; 1981   CESAREAN SECTION N/A    [employer] 04/22/2020   COLONOSCOPY WITH PROPOFOL N/A 10/29/2016   Procedure: COLONOSCOPY WITH PROPOFOL;  Surgeon: West Bali, MD;  Location: [facility];  Service: Endoscopy;  Laterality: N/A;  10:00 am   CYSTOSCOPY Left 04/13/2014   Procedure: CYSTOSCOPY FLEXIBLE;  Surgeon: Crist Fat, MD;  Location: [facility];  Service: Urology;  Laterality: Left;  with STENT   ESOPHAGOGASTRODUODENOSCOPY (EGD) WITH PROPOFOL N/A 10/29/2016   Procedure: ESOPHAGOGASTRODUODENOSCOPY (EGD) WITH PROPOFOL;  Surgeon: West Bali, MD;  Location: [facility];  Service: Endoscopy;  Laterality: N/A;   EYE SURGERY     implant and screws put in the right lower jaw  11/2008   dental surgery   kidney blockage  ?1990's   " major surgery ;put kidney on pump for awhile"   LITHOTRIPSY     "several times"   LUMBAR FUSION  08/2010   MASTECTOMY  1997   right   NEPHROLITHOTOMY Left 04/13/2014   Procedure: LEFT PERCUTANEOUS NEPHROLITHOTOMY WITH SURGEON ACCESS;  Surgeon: Crist Fat, MD;  Location: [facility];  Service: Urology;  Laterality: Left;   OVARIAN CYST SURGERY     PARATHYROIDECTOMY  02/26/2012   Procedure: PARATHYROIDECTOMY;  Surgeon: Darletta Moll, MD;  [facility];  Service: ENT;;   SAVORY DILATION N/A  10/29/2016   Procedure: SAVORY DILATION;  Surgeon: West Bali, MD;  Location: [facility];  Service: Endoscopy;  Laterality: N/A;   SPINE SURGERY  2011   urological surgery for blocked ureter secondary to kidney stone     Patient Active Problem List   Diagnosis Date Noted   Gait abnormality 12/19/2022   Peripheral neuropathy 09/17/2022   Right foot pain 05/02/2022   Hypokalemia 05/02/2022   Overactive bladder 02/08/2022   Left knee pain 02/08/2022   Right leg swelling 02/08/2022   Encounter for general adult medical examination with abnormal findings 08/30/2021   Chronic fatigue 08/30/2021   Vitamin B12 deficiency 08/30/2021   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   Balance problem 12/26/2020   Thyroid nodule 01/16/2018   Overweight (BMI 25.0-29.9) 12/18/2017   Diabetic neuropathy (HCC) 10/13/2017   Genetic testing 11/28/2016   Esophageal dysphagia 09/30/2016   Ventral hernia 07/28/2015   Situational anxiety 04/26/2015   Thyromegaly 01/23/2015   Iron deficiency 06/17/2014   DDD (degenerative disc disease), lumbar 01/26/2014   Bulge of lumbar disc without myelopathy 01/26/2014   Renal cyst 01/26/2014   Back pain with left-sided sciatica 12/28/2013   Uncontrolled type 2 diabetes mellitus with hyperglycemia, without long-term current use of insulin (HCC) 08/01/2013   GASTROPARESIS 12/15/2008   GERD (gastroesophageal reflux disease) 06/21/2008   IBS (irritable bowel syndrome) 06/21/2008   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 (HCC) 02/09/2008   Hyperlipidemia 02/09/2008   Essential hypertension 02/09/2008   Insomnia 02/09/2008    ONSET DATE: May 2023:  Referral date 11-27-22  REFERRING DIAG: R26.9 (ICD-10-CM) - Gait abnormality W19.XXXD (ICD-10-CM) - Fall, subsequent encounter  THERAPY DIAG:  Unsteadiness on feet  Other abnormalities of gait and mobility  Muscle weakness (generalized)  Rationale for Evaluation and Treatment: Rehabilitation  SUBJECTIVE:  SUBJECTIVE STATEMENT: Pt reports she walked from car to pool area today without her cane - first time! Pt states she is doing better with walking and with keeping her balance Pt accompanied by: self  PERTINENT HISTORY: This is a 77 year old woman with past medical history of breast cancer, s/p chemotherapy, hypertension, diabetes, who is presenting with multiple falls. Patient reports since May of last year, she has a total of 6 falls resulting in torn ligament, and bruises. She denies any problems prior to the fall, no warning signs, no dizziness, no lightheadedness  PAIN:  Are you having pain? Yes: NPRS scale: 2/10 Pain location: Rt quads Aggravating factors: no specific factors Relieving factors: ice and Advil  PRECAUTIONS: Fall  WEIGHT BEARING RESTRICTIONS: No  FALLS: Has patient fallen in last 6 months? Yes. Number of falls approx. 3   LIVING ENVIRONMENT: Lives with: lives with their spouse and lives with their daughter Lives in: House/apartment Stairs: Yes: Internal: 12 steps; on right going up and External: 1 steps; none pt does not have to go up to 2nd level Has following equipment at home: Single point cane and Walker - 2 wheeled  PLOF: Independent  PATIENT GOALS: improve walking and leg strength   OBJECTIVE:  Aquatic therapy at [facility] - pool temp 90 degrees  Patient seen for aquatic therapy today.  Treatment took place in water 3.6-4.5 feet deep depending upon activity.  Pt entered and exited the pool via step negotiation using bil. Hand rails with supervision.  Pt performed water walking for warm up - forwards, backwards, and sideways -- 4 laps (18'x 20 each direction) - pt held pool noodle for assist with balance and stability  Marching in place 10 reps each leg with UE support on pool noodle;  marching forwards /backwards 18' x 4 reps with UE support on pool noodle  Squats bil. LE's 10 reps; unilateral squats - 10 reps each leg with bil. UE support on pool edge  Rt hip AROM exercises - hip flexion, abduction, extension with use of aquatic cuff 20 reps for increased resistance with the eccentric contraction; hip abdct./adduction with knee flexed at 90 degrees 10 reps with use of aquatic cuff for increased resistance; Rt hip adduction/abduction with use of aquatic cuff 20 reps with UE support on pool edge  Pt stood with LUE support on pool edge - made circles clockwise 10 reps and then counterclockwise 10 reps with RLE for hip ROM and strengthening  Standing against pool wall in 4' water depth - pt performed Rt knee extension/flexion with aquatic cuff 10 reps x 2 sets; pt performed closed chain strengthening with foot on pool noodle pushing down toward floor 10 reps with assist to hold noodle in place (large yellow noodle used)  Forward ambulation with bar bells in each hand 18' x 6 reps at end of session  Ai Chi posture "Freeing" with UE support on pool wall with tactile cues for correct sequence and coordination with this exercise  Pt requires buoyancy of water for support for reduced fall risk and for unloading/reduced stress on joints as pt able to tolerate increased standing and ambulation in water compared to that on land; viscosity of water is needed for resistance for strengthening and current of water provides perturbations for challenge for balance training            12-18-22: Pt instructed in HEP for bil. LE strengthening - [facility] HEP [email or web address]; pt performed exercises listed below 10 reps each leg; resistance  used was red theraband  Access Code: [email or web address] URL: [email or web address] Date: 12/18/2022 Prepared by: Maebelle Munroe  Exercises - Supine Bridge  - 1 x daily - 7 x weekly - 1 sets - 10 reps - 5 sec hold - Single Leg Bridge  - 1 x daily - 7 x  weekly - 1 sets - 10 reps - 3 sec hold - Sidelying Hip Abduction  - 1 x daily - 7 x weekly - 3 sets - 10 reps - Clamshell  - 1 x daily - 7 x weekly - 1 sets - 10 reps - Supine Hip Flexion  - 1 x daily - 7 x weekly - 1 sets - 10 reps - 3 sec hold - Hooklying Clamshell with Resistance  - 1 x daily - 7 x weekly - 1 sets - 10 reps - Standing Hip Extension with Resistance at Ankles and Counter Support  - 1 x daily - 7 x weekly - 1 sets - 10 reps - 3 sec hold hold - Sit to Stand  - 1 x daily - 7 x weekly - 1 sets - 10 reps      PATIENT EDUCATION: Education details: [facility] HEP;   12-24-22 - added piriformis stretch and heel raises to HEP  Person educated: Patient Education method: Explanation Education comprehension: verbalized understanding  HOME EXERCISE PROGRAM: To be established  GOALS: Goals reviewed with patient? Yes   SHORT TERM GOALS: Target date: 01-10-23   Improve 5x sit to stand score to </= 23 secs with RUE support from standard chair. Baseline:  28.06 secs:   19.53 secs with RUE support  Goal status: Goal met 01-06-23  2.  Amb. 250' with RW with SBA for increased community accessibility.  Baseline:  Goal status: Goal met   3. Improve gait velocity by at least .3 ft/sec with use of SPC for increased gait efficiency. Baseline: 12.97 secs no device on 01-06-23 Goal status: Ongoing - 01-06-23  4.  Improve TUG score to </= 19 secs with SPC for reduced fall risk. Baseline: 22,31 secs with SPC;   14.25 secs Goal status: Goal met 01-06-23  5.  Independent in HEP for bil. LE strengthening and balance exercises.  Baseline:  Goal status: Goal met    LONG TERM GOALS: Target date: 02-07-23  Improve 5x sit to stand score to </= 20 secs with RUE support from standard chair. Baseline:  28.06 secs Goal status: INITIAL  2.  Amb. 460' with RW with SBA for increased community accessibility.  Baseline:  Goal status: INITIAL  3.   Improve gait velocity by at least .6 ft/sec with  use of SPC for increased gait efficiency. Baseline:  Goal status: INITIAL  4.   Improve TUG score to </= 15 secs with SPC for reduced fall risk. Baseline: 22.31 secs Goal status: INITIAL  5.  Independent in updated HEP for balance and strengthening. Baseline:  Goal status: INITIAL    ASSESSMENT:  CLINICAL IMPRESSION: Today's aquatic PT session focused on gait training with UE support on large pool noodle for stability at start of session and on bil. LE strengthening with use of aquatic cuff.  Pt's balance improved as session progressed and pt acclimated to water and the current.  Pt reported the pain in her RLE is decreasing significantly but feels her LLE is now slightly weaker than her RLE.  Pt amb. To pool area without [facility] for first time today.   Pt is progressing well towards goals.  Cont with  POC.     OBJECTIVE IMPAIRMENTS: Abnormal gait, decreased activity tolerance, decreased balance, decreased strength, dizziness, impaired sensation, and pain.   ACTIVITY LIMITATIONS: carrying, bending, standing, squatting, stairs, and locomotion level  PARTICIPATION LIMITATIONS: meal prep, cleaning, laundry, driving, shopping, and community activity  PERSONAL FACTORS: Past/current experiences, Time since onset of injury/illness/exacerbation, and unknown etiology of falls  are also affecting patient's functional outcome.   REHAB POTENTIAL: Good  CLINICAL DECISION MAKING: Evolving/moderate complexity  EVALUATION COMPLEXITY: Moderate  PLAN:  PT FREQUENCY: 2x/week  PT DURATION: 8 weeks  PLANNED INTERVENTIONS: Therapeutic exercises, Therapeutic activity, Neuromuscular re-education, Balance training, Gait training, Patient/Family education, Self Care, Stair training, Vestibular training, DME instructions, and Aquatic Therapy  PLAN FOR NEXT SESSION:   continue strengthening exercises   Ming Kunka, Donavan Burnet, PT 01/27/2023, 8:38 PM

## 2023-01-28 ENCOUNTER — Ambulatory Visit: Payer: Medicare Other | Admitting: Physical Therapy

## 2023-01-28 DIAGNOSIS — R2681 Unsteadiness on feet: Secondary | ICD-10-CM

## 2023-01-28 DIAGNOSIS — M6281 Muscle weakness (generalized): Secondary | ICD-10-CM

## 2023-01-29 ENCOUNTER — Encounter: Payer: Self-pay | Admitting: Physical Therapy

## 2023-01-29 NOTE — Therapy (Signed)
OUTPATIENT PHYSICAL THERAPY NEURO TREATMENT NOTE   Patient Name: [name] MRN: [id number] DOB:28-Jun-1946, 77 y.o., female Today's Date: 01/29/2023   PCP: Anabel Halon, MD REFERRING PROVIDER: Windell Norfolk, MD  END OF SESSION:  PT End of Session - 01/29/23 2002     Visit Number 12    Number of Visits 17    Date for PT Re-Evaluation 02/07/23    Authorization Type [employer]    Authorization Time Period 12-09-22 - 02-21-23    PT Start Time 1536    PT Stop Time 1622    PT Time Calculation (min) 46 min    Equipment Utilized During Treatment Gait belt    Activity Tolerance Patient tolerated treatment well    Behavior During Therapy WFL for tasks assessed/performed                   Past Medical History:  Diagnosis Date   (HFpEF) heart failure with preserved ejection fraction (HCC)    Abdominal hernia 02/26/2012   unrepaired   Adenocarcinoma of breast (HCC) 1997   right / chemo + tamoxifen x 5 years    Anemia    Anxiety    Aortic atherosclerosis (HCC)    Arthritis    Blood transfusion 1980's   Breast cancer (HCC)    Cancer (HCC)    [employer] 08/18/2020   Complication of anesthesia    has a hard time waking up; can't lay flat   Degenerative disc disease, lumbar    pressing on L3 and L4   Diabetes mellitus (HCC) 1989   Type 2 NIDDM; "cancer treatment gave me diabetes"   Diabetes mellitus without complication (HCC)    [employer] 08/18/2020   Diverticulosis    DJD (degenerative joint disease) of lumbar spine    Dysrhythmia    "irregular"   Exertional dyspnea    Gastroparesis    GERD (gastroesophageal reflux disease)    mann   Heart murmur    "think I outgrew it"   Hepatic steatosis    History of kidney stones    History of stomach ulcers    Hx: UTI (urinary tract infection)    Hyperlipidemia    Hypersensitivity    in tongue   Hypertension    IBS (irritable bowel syndrome)    Insomnia    Internal hemorrhoids    Kidney cysts    RT KIDNEY    Kidney stones 2009   s/p lithotripsy   Mild CAD    Mild valvular heart disease    Nephrolithiasis 04/13/2014   Obesity    PFO (patent foramen ovale)    PONV (postoperative nausea and vomiting)    Pulmonary nodule    Shortness of breath    unable to lie flat   Ventral hernia    Past Surgical History:  Procedure Laterality Date   ABDOMINAL HYSTERECTOMY  2002   APPENDECTOMY     BACK SURGERY     Can't take any medical procedure in right arm   BREAST BIOPSY     right   BREAST SURGERY N/A    [employer] 04/22/2020   CATARACT EXTRACTION W/ INTRAOCULAR LENS  IMPLANT, BILATERAL  2009-2010   CESAREAN SECTION  1973; 1978; 1981   CESAREAN SECTION N/A    [employer] 04/22/2020   COLONOSCOPY WITH PROPOFOL N/A 10/29/2016   Procedure: COLONOSCOPY WITH PROPOFOL;  Surgeon: West Bali, MD;  Location: [facility];  Service: Endoscopy;  Laterality: N/A;  10:00 am   CYSTOSCOPY  Left 04/13/2014   Procedure: CYSTOSCOPY FLEXIBLE;  Surgeon: Crist Fat, MD;  Location: [facility];  Service: Urology;  Laterality: Left;  with STENT   ESOPHAGOGASTRODUODENOSCOPY (EGD) WITH PROPOFOL N/A 10/29/2016   Procedure: ESOPHAGOGASTRODUODENOSCOPY (EGD) WITH PROPOFOL;  Surgeon: West Bali, MD;  Location: [facility];  Service: Endoscopy;  Laterality: N/A;   EYE SURGERY     implant and screws put in the right lower jaw  11/2008   dental surgery   kidney blockage  ?1990's   " major surgery ;put kidney on pump for awhile"   LITHOTRIPSY     "several times"   LUMBAR FUSION  08/2010   MASTECTOMY  1997   right   NEPHROLITHOTOMY Left 04/13/2014   Procedure: LEFT PERCUTANEOUS NEPHROLITHOTOMY WITH SURGEON ACCESS;  Surgeon: Crist Fat, MD;  Location: [facility];  Service: Urology;  Laterality: Left;   OVARIAN CYST SURGERY     PARATHYROIDECTOMY  02/26/2012   Procedure: PARATHYROIDECTOMY;  Surgeon: Darletta Moll, MD;  Location: [facility];  Service: ENT;;   SAVORY DILATION N/A 10/29/2016   Procedure: SAVORY DILATION;  Surgeon:  West Bali, MD;  Location: [facility];  Service: Endoscopy;  Laterality: N/A;   SPINE SURGERY  2011   urological surgery for blocked ureter secondary to kidney stone     Patient Active Problem List   Diagnosis Date Noted   Gait abnormality 12/19/2022   Peripheral neuropathy 09/17/2022   Right foot pain 05/02/2022   Hypokalemia 05/02/2022   Overactive bladder 02/08/2022   Left knee pain 02/08/2022   Right leg swelling 02/08/2022   Encounter for general adult medical examination with abnormal findings 08/30/2021   Chronic fatigue 08/30/2021   Vitamin B12 deficiency 08/30/2021   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   Balance problem 12/26/2020   Thyroid nodule 01/16/2018   Overweight (BMI 25.0-29.9) 12/18/2017   Diabetic neuropathy (HCC) 10/13/2017   Genetic testing 11/28/2016   Esophageal dysphagia 09/30/2016   Ventral hernia 07/28/2015   Situational anxiety 04/26/2015   Thyromegaly 01/23/2015   Iron deficiency 06/17/2014   DDD (degenerative disc disease), lumbar 01/26/2014   Bulge of lumbar disc without myelopathy 01/26/2014   Renal cyst 01/26/2014   Back pain with left-sided sciatica 12/28/2013   Uncontrolled type 2 diabetes mellitus with hyperglycemia, without long-term current use of insulin (HCC) 08/01/2013   GASTROPARESIS 12/15/2008   GERD (gastroesophageal reflux disease) 06/21/2008   IBS (irritable bowel syndrome) 06/21/2008   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 (HCC) 02/09/2008   Hyperlipidemia 02/09/2008   Essential hypertension 02/09/2008   Insomnia 02/09/2008    ONSET DATE: May 2023:  Referral date 11-27-22  REFERRING DIAG: R26.9 (ICD-10-CM) - Gait abnormality W19.XXXD (ICD-10-CM) - Fall, subsequent encounter  THERAPY DIAG:  Unsteadiness on feet  Muscle weakness (generalized)  Rationale for Evaluation and Treatment: Rehabilitation  SUBJECTIVE:  SUBJECTIVE STATEMENT: Pt reports her Rt leg is getting much better - still feels a slight pull when she extends her knee but is now able to lift Rt leg in seated position (hip flexion) without much pain; still using RW but was going to use [facility] today but it was in other vehicle Pt accompanied by: self  PERTINENT HISTORY: This is a 77 year old woman with past medical history of breast cancer, s/p chemotherapy, hypertension, diabetes, who is presenting with multiple falls. Patient reports since May of last year, she has a total of 6 falls resulting in torn ligament, and bruises. She denies any prodromes prior to the fall, no warning signs, no dizziness, no lightheadedness  PAIN:  Are you having pain? Yes: NPRS scale: 4-5/10 Pain location: Rt quads Aggravating factors: no specific factors Relieving factors: ice and Advil  PRECAUTIONS: Fall  WEIGHT BEARING RESTRICTIONS: No  FALLS: Has patient fallen in last 6 months? Yes. Number of falls approx. 3   LIVING ENVIRONMENT: Lives with: lives with their spouse and lives with their daughter Lives in: House/apartment Stairs: Yes: Internal: 12 steps; on right going up and External: 1 steps; none pt does not have to go up to 2nd level Has following equipment at home: Single point cane and Walker - 2 wheeled  PLOF: Independent  PATIENT GOALS: improve walking and leg strength  OBJECTIVE:    GAIT: Gait pattern:  decreased push off due to gastroc weakness LLE and step through pattern Distance walked: approx. 200' total distance Assistive device utilized:  No device used Level of assistance: modified independent Comments: practiced amb. Approx. 30' forward/abrupt stop and turn 180 degrees with CGA for balance recovery x 6 reps  Amb. 40' x 1 rep with horizontal head turns with CGA for balance recovery  NeuroRe-ed:   Sit to stand with feet on Airex 5 reps from mat  without UE support with SBA Tap ups to 1st step 5 reps each foot without UE support; tap ups to 2nd step with minimal UE support on hand rails 5 reps each LE Pt amb. 35' x 4 reps tossing and catching ball with CGA for multi tasking with gait; amb. Backwards 30' x 2 reps tossing and catch Quadriped position - lifting opposite UE/LE with CGA to min assist for balance - 3 reps each side Performed in standing at counter - lifting opposite UE/LE at counter 3 reps each side with 5 sec hold - added this to HEP  Pt sat on SItFIt - performed seated marching 5 reps each leg; contralateral knee extension with UE fllexion 3 reps each side  TherEx: Heel raises 10 reps bil. LE's Step ups onto 6" step 10 reps each leg with UE support prn   12-18-22: Pt instructed in HEP for bil. LE strengthening - Med[employer] HEP [email or web address]; pt performed exercises listed below 10 reps each leg; resistance used was red theraband  Access Code: [email or web address] URL: [email or web address] Date: 12/18/2022 Prepared by: Maebelle Munroe  Exercises - Supine Bridge  - 1 x daily - 7 x weekly - 1 sets - 10 reps - 5 sec hold - Single Leg Bridge  - 1 x daily - 7 x weekly - 1 sets - 10 reps - 3 sec hold - Sidelying Hip Abduction  - 1 x daily - 7 x weekly - 3 sets - 10 reps - Clamshell  - 1 x daily - 7 x weekly - 1 sets - 10 reps - Supine Hip Flexion  - 1 x  daily - 7 x weekly - 1 sets - 10 reps - 3 sec hold - Hooklying Clamshell with Resistance  - 1 x daily - 7 x weekly - 1 sets - 10 reps - Standing Hip Extension with Resistance at Ankles and Counter Support  - 1 x daily - 7 x weekly - 1 sets - 10 reps - 3 sec hold hold - Sit to Stand  - 1 x daily - 7 x weekly - 1 sets - 10 reps      PATIENT EDUCATION: Education details: [employer] HEP;   12-24-22 - added piriformis stretch and heel raises to HEP  Person educated: Patient Education method: Explanation Education comprehension: verbalized understanding  HOME EXERCISE  PROGRAM: To be established  GOALS: Goals reviewed with patient? Yes   SHORT TERM GOALS: Target date: 01-10-23  Improve 5x sit to stand score to </= 23 secs with RUE support from standard chair. Baseline:  28.06 secs:   19.53 secs with RUE support  Goal status: Goal met 01-06-23  2.  Amb. 250' with RW with SBA for increased community accessibility.  Baseline:  Goal status: Goal met   3. Improve gait velocity by at least .3 ft/sec with use of SPC for increased gait efficiency. Baseline: 12.97 secs no device on 01-06-23 Goal status: Ongoing - 01-06-23  4.  Improve TUG score to </= 19 secs with SPC for reduced fall risk. Baseline: 22,31 secs with SPC;   14.25 secs Goal status: Goal met 01-06-23  5.  Independent in HEP for bil. LE strengthening and balance exercises.  Baseline:  Goal status: Goal met    LONG TERM GOALS: Target date: 02-07-23  Improve 5x sit to stand score to </= 20 secs with RUE support from standard chair. Baseline:  28.06 secs Goal status: INITIAL  2.  Amb. 460' with RW with SBA for increased community accessibility.  Baseline:  Goal status: INITIAL  3.   Improve gait velocity by at least .6 ft/sec with use of SPC for increased gait efficiency. Baseline:  Goal status: INITIAL  4.   Improve TUG score to </= 15 secs with SPC for reduced fall risk. Baseline: 22.31 secs Goal status: INITIAL  5.  Independent in updated HEP for balance and strengthening. Baseline:  Goal status: INITIAL    ASSESSMENT:  CLINICAL IMPRESSION: PT session focused on dynamic gait activities and core stabilization exercises.  Pt had large truncal movement with abrupt stop and turn resulting in mild LOB but pt improved with stability with practice and repetition.  Pt unable to perform UE flexion/LE extension contralaterally in quadruped position due to decreased core stabilization.  Pt is progressing with gait and balance as pt is now ambulating without device.   Cont with POC.      OBJECTIVE IMPAIRMENTS: Abnormal gait, decreased activity tolerance, decreased balance, decreased strength, dizziness, impaired sensation, and pain.   ACTIVITY LIMITATIONS: carrying, bending, standing, squatting, stairs, and locomotion level  PARTICIPATION LIMITATIONS: meal prep, cleaning, laundry, driving, shopping, and community activity  PERSONAL FACTORS: Past/current experiences, Time since onset of injury/illness/exacerbation, and unknown etiology of falls  are also affecting patient's functional outcome.   REHAB POTENTIAL: Good  CLINICAL DECISION MAKING: Evolving/moderate complexity  EVALUATION COMPLEXITY: Moderate  PLAN:  PT FREQUENCY: 2x/week  PT DURATION: 8 weeks  PLANNED INTERVENTIONS: Therapeutic exercises, Therapeutic activity, Neuromuscular re-education, Balance training, Gait training, Patient/Family education, Self Care, Stair training, Vestibular training, DME instructions, and Aquatic Therapy  PLAN FOR NEXT SESSION:  check core stabilization  exercise; continue strengthening exercises RLE and balance training   Mitsuru Dault, Donavan Burnet, PT 01/29/2023, 8:07 PM

## 2023-01-30 ENCOUNTER — Other Ambulatory Visit: Payer: Self-pay | Admitting: Family Medicine

## 2023-01-30 DIAGNOSIS — E1165 Type 2 diabetes mellitus with hyperglycemia: Secondary | ICD-10-CM

## 2023-02-03 ENCOUNTER — Ambulatory Visit: Payer: Medicare Other | Admitting: Physical Therapy

## 2023-02-03 DIAGNOSIS — R2681 Unsteadiness on feet: Secondary | ICD-10-CM | POA: Diagnosis not present

## 2023-02-03 DIAGNOSIS — R2689 Other abnormalities of gait and mobility: Secondary | ICD-10-CM

## 2023-02-03 DIAGNOSIS — M6281 Muscle weakness (generalized): Secondary | ICD-10-CM

## 2023-02-04 ENCOUNTER — Encounter: Payer: Self-pay | Admitting: Physical Therapy

## 2023-02-04 NOTE — Therapy (Signed)
OUTPATIENT PHYSICAL THERAPY NEURO TREATMENT NOTE/AQUATIC THERAPY     Patient Name: [name] MRN: [id number] DOB:1946-08-08, 77 y.o., female Today's Date: 02/04/2023   PCP: Anabel Halon, MD REFERRING PROVIDER: Windell Norfolk, MD  END OF SESSION:  PT End of Session - 02/04/23 1426     Visit Number 13    Number of Visits 17    Date for PT Re-Evaluation 02/07/23    Authorization Type [employer]    Authorization Time Period 12-09-22 - 02-21-23    PT Start Time 1530    PT Stop Time 1620    PT Time Calculation (min) 50 min    Equipment Utilized During Treatment Other (comment)   bar bells, pool noodle, aquatic cuffs   Activity Tolerance Patient tolerated treatment well    Behavior During Therapy WFL for tasks assessed/performed                 Past Medical History:  Diagnosis Date   (HFpEF) heart failure with preserved ejection fraction (HCC)    Abdominal hernia 02/26/2012   unrepaired   Adenocarcinoma of breast (HCC) 1997   right / chemo + tamoxifen x 5 years    Anemia    Anxiety    Aortic atherosclerosis (HCC)    Arthritis    Blood transfusion 1980's   Breast cancer (HCC)    Cancer (HCC)    [employer] 08/18/2020   Complication of anesthesia    has a hard time waking up; can't lay flat   Degenerative disc disease, lumbar    pressing on L3 and L4   Diabetes mellitus (HCC) 1989   Type 2 NIDDM; "cancer treatment gave me diabetes"   Diabetes mellitus without complication (HCC)    [employer] 08/18/2020   Diverticulosis    DJD (degenerative joint disease) of lumbar spine    Dysrhythmia    "irregular"   Exertional dyspnea    Gastroparesis    GERD (gastroesophageal reflux disease)    mann   Heart murmur    "think I outgrew it"   Hepatic steatosis    History of kidney stones    History of stomach ulcers    Hx: UTI (urinary tract infection)    Hyperlipidemia    Hypersensitivity    in tongue   Hypertension    IBS (irritable bowel syndrome)     Insomnia    Internal hemorrhoids    Kidney cysts    RT KIDNEY   Kidney stones 2009   s/p lithotripsy   Mild CAD    Mild valvular heart disease    Nephrolithiasis 04/13/2014   Obesity    PFO (patent foramen ovale)    PONV (postoperative nausea and vomiting)    Pulmonary nodule    Shortness of breath    unable to lie flat   Ventral hernia    Past Surgical History:  Procedure Laterality Date   ABDOMINAL HYSTERECTOMY  2002   APPENDECTOMY     BACK SURGERY     Can't take any medical procedure in right arm   BREAST BIOPSY     right   BREAST SURGERY N/A    [employer] 04/22/2020   CATARACT EXTRACTION W/ INTRAOCULAR LENS  IMPLANT, BILATERAL  2009-2010   CESAREAN SECTION  1973; 1978; 1981   CESAREAN SECTION N/A    [employer] 04/22/2020   COLONOSCOPY WITH PROPOFOL N/A 10/29/2016   Procedure: COLONOSCOPY WITH PROPOFOL;  Surgeon: West Bali, MD;  Location: [facility];  Service: Endoscopy;  Laterality: N/A;  10:00 am   CYSTOSCOPY Left 04/13/2014   Procedure: CYSTOSCOPY FLEXIBLE;  Surgeon: Crist Fat, MD;  Location: [facility];  Service: Urology;  Laterality: Left;  with STENT   ESOPHAGOGASTRODUODENOSCOPY (EGD) WITH PROPOFOL N/A 10/29/2016   Procedure: ESOPHAGOGASTRODUODENOSCOPY (EGD) WITH PROPOFOL;  Surgeon: West Bali, MD;  Location: [facility];  Service: Endoscopy;  Laterality: N/A;   EYE SURGERY     implant and screws put in the right lower jaw  11/2008   dental surgery   kidney blockage  ?1990's   " major surgery ;put kidney on pump for awhile"   LITHOTRIPSY     "several times"   LUMBAR FUSION  08/2010   MASTECTOMY  1997   right   NEPHROLITHOTOMY Left 04/13/2014   Procedure: LEFT PERCUTANEOUS NEPHROLITHOTOMY WITH SURGEON ACCESS;  Surgeon: Crist Fat, MD;  Location: [facility];  Service: Urology;  Laterality: Left;   OVARIAN CYST SURGERY     PARATHYROIDECTOMY  02/26/2012   Procedure: PARATHYROIDECTOMY;  Surgeon: Darletta Moll, MD;  Location: [facility];  Service: ENT;;    SAVORY DILATION N/A 10/29/2016   Procedure: SAVORY DILATION;  Surgeon: West Bali, MD;  Location: [facility];  Service: Endoscopy;  Laterality: N/A;   SPINE SURGERY  2011   urological surgery for blocked ureter secondary to kidney stone     Patient Active Problem List   Diagnosis Date Noted   Gait abnormality 12/19/2022   Peripheral neuropathy 09/17/2022   Right foot pain 05/02/2022   Hypokalemia 05/02/2022   Overactive bladder 02/08/2022   Left knee pain 02/08/2022   Right leg swelling 02/08/2022   Encounter for general adult medical examination with abnormal findings 08/30/2021   Chronic fatigue 08/30/2021   Vitamin B12 deficiency 08/30/2021   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   Balance problem 12/26/2020   Thyroid nodule 01/16/2018   Overweight (BMI 25.0-29.9) 12/18/2017   Diabetic neuropathy (HCC) 10/13/2017   Genetic testing 11/28/2016   Esophageal dysphagia 09/30/2016   Ventral hernia 07/28/2015   Situational anxiety 04/26/2015   Thyromegaly 01/23/2015   Iron deficiency 06/17/2014   DDD (degenerative disc disease), lumbar 01/26/2014   Bulge of lumbar disc without myelopathy 01/26/2014   Renal cyst 01/26/2014   Back pain with left-sided sciatica 12/28/2013   Uncontrolled type 2 diabetes mellitus with hyperglycemia, without long-term current use of insulin (HCC) 08/01/2013   GASTROPARESIS 12/15/2008   GERD (gastroesophageal reflux disease) 06/21/2008   IBS (irritable bowel syndrome) 06/21/2008   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 (HCC) 02/09/2008   Hyperlipidemia 02/09/2008   Essential hypertension 02/09/2008   Insomnia 02/09/2008    ONSET DATE: May 2023:  Referral date 11-27-22  REFERRING DIAG: R26.9 (ICD-10-CM) - Gait abnormality W19.XXXD (ICD-10-CM) - Fall, subsequent encounter  THERAPY DIAG:  Unsteadiness on feet  Muscle weakness (generalized)  Other abnormalities of gait and mobility  Rationale for Evaluation and Treatment:  Rehabilitation  SUBJECTIVE:  SUBJECTIVE STATEMENT: Pt reports she continues to improve with her balance; walked back to pool area again today without use of cane Pt accompanied by: self  PERTINENT HISTORY: This is a 77 year old woman with past medical history of breast cancer, s/p chemotherapy, hypertension, diabetes, who is presenting with multiple falls. Patient reports since May of last year, she has a total of 6 falls resulting in torn ligament, and bruises. She denies any problems prior to the fall, no warning signs, no dizziness, no lightheadedness  PAIN:  Are you having pain? Yes: NPRS scale: 2/10 Pain location: Rt quads Aggravating factors: no specific factors Relieving factors: ice and Advil  PRECAUTIONS: Fall  WEIGHT BEARING RESTRICTIONS: No  FALLS: Has patient fallen in last 6 months? Yes. Number of falls approx. 3   LIVING ENVIRONMENT: Lives with: lives with their spouse and lives with their daughter Lives in: House/apartment Stairs: Yes: Internal: 12 steps; on right going up and External: 1 steps; none pt does not have to go up to 2nd level Has following equipment at home: Single point cane and Walker - 2 wheeled  PLOF: Independent  PATIENT GOALS: improve walking and leg strength   OBJECTIVE:  Aquatic therapy at [facility] - pool temp 90 degrees  Patient seen for aquatic therapy today.  Treatment took place in water 3.6-4.5 feet deep depending upon activity.  Pt entered and exited the pool via step negotiation using bil. Hand rails with supervision.  Pt performed water walking for warm up - forwards, backwards, and sideways -- 4 laps (18'x 20 each direction) - pt held pool noodle for assist with balance and stability  Marching in place 10 reps each leg with UE support on pool  noodle; marching forwards /backwards 18' x 4 reps with UE support on pool noodle  Squats bil. LE's 10 reps; unilateral squats - 10 reps each leg with bil. UE support on pool edge  Rt hip AROM exercises - hip flexion, abduction, extension with use of aquatic cuff 10 reps for increased resistance with the eccentric contraction; hip abdct./adduction with knee flexed at 90 degrees 10 reps with use of aquatic cuff for increased resistance; Rt hip adduction/abduction with use of aquatic cuff 10 reps with UE support on pool edge  Pt performed closed chain strengthening with foot on pool noodle pushing down toward floor 10 reps with assist to hold noodle in place (large yellow noodle used)  Forward ambulation with bar bells in each hand 18' x 6 reps at end of session  Ai Chi posture "Freeing" with UE support on pool wall with tactile cues for correct sequence and coordination with this exercise  Pt requires buoyancy of water for support for reduced fall risk and for unloading/reduced stress on joints as pt able to tolerate increased standing and ambulation in water compared to that on land; viscosity of water is needed for resistance for strengthening and current of water provides perturbations for challenge for balance training            12-18-22: Pt instructed in HEP for bil. LE strengthening - [facility] HEP [email or web address]; pt performed exercises listed below 10 reps each leg; resistance used was red theraband  Access Code: [email or web address] URL: [email or web address] Date: 12/18/2022 Prepared by: Maebelle Munroe  Exercises - Supine Bridge  - 1 x daily - 7 x weekly - 1 sets - 10 reps - 5 sec hold - Single Leg Bridge  - 1 x daily - 7 x weekly - 1 sets - 10  reps - 3 sec hold - Sidelying Hip Abduction  - 1 x daily - 7 x weekly - 3 sets - 10 reps - Clamshell  - 1 x daily - 7 x weekly - 1 sets - 10 reps - Supine Hip Flexion  - 1 x daily - 7 x weekly - 1 sets - 10 reps - 3 sec hold - Hooklying  Clamshell with Resistance  - 1 x daily - 7 x weekly - 1 sets - 10 reps - Standing Hip Extension with Resistance at Ankles and Counter Support  - 1 x daily - 7 x weekly - 1 sets - 10 reps - 3 sec hold hold - Sit to Stand  - 1 x daily - 7 x weekly - 1 sets - 10 reps      PATIENT EDUCATION: Education details: [facility] HEP;   12-24-22 - added piriformis stretch and heel raises to HEP  Person educated: Patient Education method: Explanation Education comprehension: verbalized understanding  HOME EXERCISE PROGRAM: To be established  GOALS: Goals reviewed with patient? Yes   SHORT TERM GOALS: Target date: 01-10-23   Improve 5x sit to stand score to </= 23 secs with RUE support from standard chair. Baseline:  28.06 secs:   19.53 secs with RUE support  Goal status: Goal met 01-06-23  2.  Amb. 250' with RW with SBA for increased community accessibility.  Baseline:  Goal status: Goal met   3. Improve gait velocity by at least .3 ft/sec with use of SPC for increased gait efficiency. Baseline: 12.97 secs no device on 01-06-23 Goal status: Ongoing - 01-06-23  4.  Improve TUG score to </= 19 secs with SPC for reduced fall risk. Baseline: 22,31 secs with SPC;   14.25 secs Goal status: Goal met 01-06-23  5.  Independent in HEP for bil. LE strengthening and balance exercises.  Baseline:  Goal status: Goal met    LONG TERM GOALS: Target date: 02-07-23  Improve 5x sit to stand score to </= 20 secs with RUE support from standard chair. Baseline:  28.06 secs Goal status: INITIAL  2.  Amb. 460' with RW with SBA for increased community accessibility.  Baseline:  Goal status: INITIAL  3.   Improve gait velocity by at least .6 ft/sec with use of SPC for increased gait efficiency. Baseline:  Goal status: INITIAL  4.   Improve TUG score to </= 15 secs with SPC for reduced fall risk. Baseline: 22.31 secs Goal status: INITIAL  5.  Independent in updated HEP for balance and  strengthening. Baseline:  Goal status: INITIAL    ASSESSMENT:  CLINICAL IMPRESSION: Today's aquatic PT session focused on gait training with UE support on large pool noodle for stability at start of session and on bil. LE strengthening with use of aquatic cuff.  Pt's balance improving in pool with water current providing perturbations for challenges with dynamic standing balance.    Pt is progressing well towards goals. Cont with POC.   OBJECTIVE IMPAIRMENTS: Abnormal gait, decreased activity tolerance, decreased balance, decreased strength, dizziness, impaired sensation, and pain.   ACTIVITY LIMITATIONS: carrying, bending, standing, squatting, stairs, and locomotion level  PARTICIPATION LIMITATIONS: meal prep, cleaning, laundry, driving, shopping, and community activity  PERSONAL FACTORS: Past/current experiences, Time since onset of injury/illness/exacerbation, and unknown etiology of falls  are also affecting patient's functional outcome.   REHAB POTENTIAL: Good  CLINICAL DECISION MAKING: Evolving/moderate complexity  EVALUATION COMPLEXITY: Moderate  PLAN:  PT FREQUENCY: 2x/week  PT DURATION: 8  weeks  PLANNED INTERVENTIONS: Therapeutic exercises, Therapeutic activity, Neuromuscular re-education, Balance training, Gait training, Patient/Family education, Self Care, Stair training, Vestibular training, DME instructions, and Aquatic Therapy  PLAN FOR NEXT SESSION:   continue strengthening exercises   Kaelin Bonelli, Donavan Burnet, PT 02/04/2023, 8:01 PM

## 2023-02-06 ENCOUNTER — Ambulatory Visit: Payer: Medicare Other | Admitting: Physical Therapy

## 2023-02-06 ENCOUNTER — Encounter: Payer: Self-pay | Admitting: Physical Therapy

## 2023-02-06 DIAGNOSIS — R2681 Unsteadiness on feet: Secondary | ICD-10-CM

## 2023-02-06 DIAGNOSIS — R2689 Other abnormalities of gait and mobility: Secondary | ICD-10-CM

## 2023-02-06 DIAGNOSIS — M6281 Muscle weakness (generalized): Secondary | ICD-10-CM

## 2023-02-06 NOTE — Therapy (Signed)
OUTPATIENT PHYSICAL THERAPY NEURO TREATMENT NOTE   Patient Name: [name] MRN: [id number] DOB:[birth year], 77 y.o., female Today's Date: 02/06/2023   PCP: Anabel Halon, MD REFERRING PROVIDER: Windell Norfolk, MD  END OF SESSION:  PT End of Session - 02/06/23 1150     Visit Number 14    Number of Visits 22   14 + 8 additional = 22   Date for PT Re-Evaluation 03/07/23    Authorization Type [employer]    Authorization Time Period 12-09-22 - 02-21-23; renewal 5-16 - 03-07-23    PT Start Time 1017    PT Stop Time 1101    PT Time Calculation (min) 44 min    Activity Tolerance Patient tolerated treatment well    Behavior During Therapy [facility] for tasks assessed/performed                    Past Medical History:  Diagnosis Date   (HFpEF) heart failure with preserved ejection fraction (HCC)    Abdominal hernia 02/26/2012   unrepaired   Adenocarcinoma of breast (HCC) 1997   right / chemo + tamoxifen x 5 years    Anemia    Anxiety    Aortic atherosclerosis (HCC)    Arthritis    Blood transfusion 1980's   Breast cancer (HCC)    Cancer (HCC)    Phreesia 08/18/2020   Complication of anesthesia    has a hard time waking up; can't lay flat   Degenerative disc disease, lumbar    pressing on L3 and L4   Diabetes mellitus (HCC) 1989   Type 2 NIDDM; "cancer treatment gave me diabetes"   Diabetes mellitus without complication (HCC)    Phreesia 08/18/2020   Diverticulosis    DJD (degenerative joint disease) of lumbar spine    Dysrhythmia    "irregular"   Exertional dyspnea    Gastroparesis    GERD (gastroesophageal reflux disease)    mann   Heart murmur    "think I outgrew it"   Hepatic steatosis    History of kidney stones    History of stomach ulcers    Hx: UTI (urinary tract infection)    Hyperlipidemia    Hypersensitivity    in tongue   Hypertension    IBS (irritable bowel syndrome)    Insomnia    Internal hemorrhoids    Kidney cysts    RT KIDNEY    Kidney stones 2009   s/p lithotripsy   Mild CAD    Mild valvular heart disease    Nephrolithiasis 04/13/2014   Obesity    PFO (patent foramen ovale)    PONV (postoperative nausea and vomiting)    Pulmonary nodule    Shortness of breath    unable to lie flat   Ventral hernia    Past Surgical History:  Procedure Laterality Date   ABDOMINAL HYSTERECTOMY  2002   APPENDECTOMY     BACK SURGERY     Can't take any medical procedure in right arm   BREAST BIOPSY     right   BREAST SURGERY N/A    Phreesia 04/22/2020   CATARACT EXTRACTION W/ INTRAOCULAR LENS  IMPLANT, BILATERAL  2009-2010   CESAREAN SECTION  1973; 1978; 1981   CESAREAN SECTION N/A    Phreesia 04/22/2020   COLONOSCOPY WITH PROPOFOL N/A 10/29/2016   Procedure: COLONOSCOPY WITH PROPOFOL;  Surgeon: West Bali, MD;  Location: [facility];  Service: Endoscopy;  Laterality: N/A;  10:00 am  CYSTOSCOPY Left 04/13/2014   Procedure: CYSTOSCOPY FLEXIBLE;  Surgeon: Crist Fat, MD;  Location: [facility];  Service: Urology;  Laterality: Left;  with STENT   ESOPHAGOGASTRODUODENOSCOPY (EGD) WITH PROPOFOL N/A 10/29/2016   Procedure: ESOPHAGOGASTRODUODENOSCOPY (EGD) WITH PROPOFOL;  Surgeon: West Bali, MD;  Location: [facility];  Service: Endoscopy;  Laterality: N/A;   EYE SURGERY     implant and screws put in the right lower jaw  11/2008   dental surgery   kidney blockage  ?1990's   " major surgery ;put kidney on pump for awhile"   LITHOTRIPSY     "several times"   LUMBAR FUSION  08/2010   MASTECTOMY  1997   right   NEPHROLITHOTOMY Left 04/13/2014   Procedure: LEFT PERCUTANEOUS NEPHROLITHOTOMY WITH SURGEON ACCESS;  Surgeon: Crist Fat, MD;  Location: [facility];  Service: Urology;  Laterality: Left;   OVARIAN CYST SURGERY     PARATHYROIDECTOMY  02/26/2012   Procedure: PARATHYROIDECTOMY;  Surgeon: Darletta Moll, MD;  Location: [facility] OR;  Service: ENT;;   SAVORY DILATION N/A 10/29/2016   Procedure: SAVORY DILATION;  Surgeon:  West Bali, MD;  Location: [facility];  Service: Endoscopy;  Laterality: N/A;   SPINE SURGERY  2011   urological surgery for blocked ureter secondary to kidney stone     Patient Active Problem List   Diagnosis Date Noted   Gait abnormality 12/19/2022   Peripheral neuropathy 09/17/2022   Right foot pain 05/02/2022   Hypokalemia 05/02/2022   Overactive bladder 02/08/2022   Left knee pain 02/08/2022   Right leg swelling 02/08/2022   Encounter for general adult medical examination with abnormal findings 08/30/2021   Chronic fatigue 08/30/2021   Vitamin B12 deficiency 08/30/2021   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   Balance problem 12/26/2020   Thyroid nodule 01/16/2018   Overweight (BMI 25.0-29.9) 12/18/2017   Diabetic neuropathy (HCC) 10/13/2017   Genetic testing 11/28/2016   Esophageal dysphagia 09/30/2016   Ventral hernia 07/28/2015   Situational anxiety 04/26/2015   Thyromegaly 01/23/2015   Iron deficiency 06/17/2014   DDD (degenerative disc disease), lumbar 01/26/2014   Bulge of lumbar disc without myelopathy 01/26/2014   Renal cyst 01/26/2014   Back pain with left-sided sciatica 12/28/2013   Uncontrolled type 2 diabetes mellitus with hyperglycemia, without long-term current use of insulin (HCC) 08/01/2013   GASTROPARESIS 12/15/2008   GERD (gastroesophageal reflux disease) 06/21/2008   IBS (irritable bowel syndrome) 06/21/2008   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 (HCC) 02/09/2008   Hyperlipidemia 02/09/2008   Essential hypertension 02/09/2008   Insomnia 02/09/2008    ONSET DATE: May 2023:  Referral date 11-27-22  REFERRING DIAG: R26.9 (ICD-10-CM) - Gait abnormality W19.XXXD (ICD-10-CM) - Fall, subsequent encounter  THERAPY DIAG:  Unsteadiness on feet  Muscle weakness (generalized)  Other abnormalities of gait and mobility  Rationale for Evaluation and Treatment: Rehabilitation  SUBJECTIVE:  SUBJECTIVE STATEMENT: Pt reports she is doing much better - amb. Without device; pt reports her legs are getting stronger and now it is more of a balance problem for her.  Pt states she really has to concentrate while walking - is unable to do quick turns or she will lose her balance. No longer has the pain in her Rt leg that she was having initially.  Pt accompanied by: self  PERTINENT HISTORY: This is a 77 year old woman with past medical history of breast cancer, s/p chemotherapy, hypertension, diabetes, who is presenting with multiple falls. Patient reports since May of last year, she has a total of 6 falls resulting in torn ligament, and bruises. She denies any prodromes prior to the fall, no warning signs, no dizziness, no lightheadedness  PAIN:  Are you having pain? Yes: NPRS scale: 4-5/10 Pain location: Rt quads Aggravating factors: no specific factors Relieving factors: ice and Advil  PRECAUTIONS: Fall  WEIGHT BEARING RESTRICTIONS: No  FALLS: Has patient fallen in last 6 months? Yes. Number of falls approx. 3   LIVING ENVIRONMENT: Lives with: lives with their spouse and lives with their daughter Lives in: House/apartment Stairs: Yes: Internal: 12 steps; on right going up and External: 1 steps; none pt does not have to go up to 2nd level Has following equipment at home: Single point cane and Walker - 2 wheeled  PLOF: Independent  PATIENT GOALS: improve walking and leg strength  Today's Treatment:   02-06-23   GAIT: Gait pattern: WFL's  Distance walked: 460' (4 laps - 115' x 4 = 460') Assistive device utilized:  No device used Level of assistance: Supervision Comments: No c/o fatigue at end of 460' amb. Distance Gait velocity:  10.34 secs = 3.17 ft/sec without device  Step negotiation: pt negotiated 4 steps using hand rail with a step over step  sequence with supervision- pt had no LOB Step by step sequence without use of handrail with SBA  Curb negotiation; pt negotiated curb outside - 2 different curbs - 1 in parking lot and then curb at end of parking space to step up onto grassy area Pt able to ascend and descend curbs without device with supervision without LOB or difficulty  NeuroRe-ed:   5x Sit to stand from standard chair without UE support = 19.16 secs TUG  = no device used = 12.37 secs Pt performed Romberg with EO - standing on floor for 30 secs with no LOB and mild postural instability Romberg EC - standing on floor - 30 secs with approx. 5 touches UE support on counter due to LOB with mod. Postural instability Pt performed SLS - RLE 3.94 secs:  LLE 4.10 secs after 2 prior reps of approx. 1.6 secs duration Pt amb. 40' x 1 rep with horizontal head turns with CGA for safety with balance due to unsteadiness Amb. 40' x 1 rep with vertical head turns with CGA - mild deviation in path due to unsteadiness    Updated HEP to include balance exercises: Medbridge ; also added abrupt turn and stop and abrupt stop and turn to practice in home to have UE support prn Access Code: [email or web address] URL: [email or web address] Date: 02/06/2023 Prepared by: Maebelle Munroe  Exercises (BALANCE) - Single Leg Stance  - 1 x daily - 7 x weekly - 1 sets - 2 reps - 10 secs hold - Standing Tandem Balance with Counter Support  - 1 x daily - 7 x weekly - 1 sets - 2 reps -  30 secs hold - Romberg Stance  - 1 x daily - 7 x weekly - 1 sets - 1 reps  12-18-22: Pt instructed in HEP for bil. LE strengthening - Medbridge HEP [email or web address]; pt performed exercises listed below 10 reps each leg; resistance used was red theraband  Access Code: [email or web address] URL: [email or web address] Date: 12/18/2022 Prepared by: Maebelle Munroe  Exercises - Supine Bridge  - 1 x daily - 7 x weekly - 1 sets - 10 reps - 5 sec hold - Single Leg Bridge  - 1 x daily -  7 x weekly - 1 sets - 10 reps - 3 sec hold - Sidelying Hip Abduction  - 1 x daily - 7 x weekly - 3 sets - 10 reps - Clamshell  - 1 x daily - 7 x weekly - 1 sets - 10 reps - Supine Hip Flexion  - 1 x daily - 7 x weekly - 1 sets - 10 reps - 3 sec hold - Hooklying Clamshell with Resistance  - 1 x daily - 7 x weekly - 1 sets - 10 reps - Standing Hip Extension with Resistance at Ankles and Counter Support  - 1 x daily - 7 x weekly - 1 sets - 10 reps - 3 sec hold hold - Sit to Stand  - 1 x daily - 7 x weekly - 1 sets - 10 reps      PATIENT EDUCATION: Education details: Medbridge HEP [email or web address] (02-06-23) Person educated: Patient Education method: Explanation demonstration and handouts Education comprehension: verbalized understanding and demonstrated understanding  HOME EXERCISE PROGRAM: To be established  GOALS: Goals reviewed with patient? Yes   SHORT TERM GOALS: Target date: 01-10-23  Improve 5x sit to stand score to </= 23 secs with RUE support from standard chair. Baseline:  28.06 secs:   19.53 secs with RUE support  Goal status: Goal met 01-06-23  2.  Amb. 250' with RW with SBA for increased community accessibility.  Baseline:  Goal status: Goal met   3. Improve gait velocity by at least .3 ft/sec with use of SPC for increased gait efficiency. Baseline: 12.97 secs no device on 01-06-23 Goal status: Ongoing - 01-06-23  4.  Improve TUG score to </= 19 secs with SPC for reduced fall risk. Baseline: 22,31 secs with SPC;   14.25 secs Goal status: Goal met 01-06-23  5.  Independent in HEP for bil. LE strengthening and balance exercises.  Baseline:  Goal status: Goal met    LONG TERM GOALS: Target date: 02-07-23  Improve 5x sit to stand score to </= 20 secs with RUE support from standard chair. Baseline:  28.06 secs;   02-06-23 = 19.16 secs without UE support Goal status: Goal met   2.  Amb. 460' with RW with SBA for increased community accessibility.  Baseline: goal met with no  device Goal status: Goal met 02-06-23  3.   Improve gait velocity by at least .6 ft/sec with use of SPC for increased gait efficiency. Baseline: 10.34 secs no device = 3.17 ft/sec without device Goal status: Goal met 02-06-23  4.   Improve TUG score to </= 15 secs with SPC for reduced fall risk. Baseline: 22.31 secs; 12.37 secs without device - 02-06-23 Goal status: Goal met 02-06-23  5.  Independent in updated HEP for balance and strengthening. Baseline:  Goal status: Ongoing as new balance exercises added to HEP on 02-06-23    NEW LONG TERM GOALS: Target date: 03-07-23   1.  Improve  5x sit to stand score to </= 15 secs without support from standard chair. Baseline:  28.06 secs;   02-06-23 = 19.16 secs without UE support Goal status: Revised/upgraded  2.  Amb. 67' with horizontal head turns without LOB for increased independence and safety with environmental scanning while walking.    Baseline:  unsteadiness and LOB with amb. With head turns    Goal status:  NEW    3.  Increase gait velocity to >/= 3.4 ft/sec without use of assistive device for increased efficiency with gait.     Baseline;  3.17 ft/sec with no device on 02-06-23    Goal status:  Revised/Upgraded   4.  Pt will demonstrate improved vestibular input in maintaining balance by standing in Romberg position on floor with EC for at least  30 secs without LOB or need for UE support on counter.    Baseline; stood for 30 secs with approx. 5 touches on counter for assist with balance recovery    Goal status:  NEW  5.  Demonstrate improved SLS balance by standing for at least 5 secs on RLE and also on LLE to reduce fall risk and to increase safety with stepping over objects.           Baseline:  RLE SLS = 3.94 secs:  LLE SLS = 4.10 secs Goal status:  NEW  6.  Transfer floor to stand with supervision with minimal UE support on external object.  Baseline:  TBA  Goal status: NEW  7.  Independent in updated HEP.  Baseline:  added  new balance exercises on 02-06-23  Goal status:  ONGOING  ASSESSMENT:  CLINICAL IMPRESSION: PT session focused on assessment of LTG's for renewal.  Pt has met LTG's #1-4 with LTG #5 ongoing as balance exercises were added to HEP in today's session.  Pt demonstrates decreased vestibular input as evidenced by moderate postural instability with standing with feet together with EC with need for UE support to assist with balance recovery.  Pt is improving with maintaining balance with abrupt stop and turn with ambulation but continues to have increased trunk movement resulting in mild postural instability.  Pt is progressing well with gait with pt having progressed from use of RW to [facility] to use of no device at this time.  Pt will benefit from continued skilled PT to address balance and high level gait deficits.  Pt agrees (and requests) renewal for recertification.  Cont with POC.     OBJECTIVE IMPAIRMENTS: Abnormal gait, decreased activity tolerance, decreased balance, decreased strength, dizziness, impaired sensation, and pain.   ACTIVITY LIMITATIONS: carrying, bending, standing, squatting, stairs, and locomotion level  PARTICIPATION LIMITATIONS: meal prep, cleaning, laundry, driving, shopping, and community activity  PERSONAL FACTORS: Past/current experiences, Time since onset of injury/illness/exacerbation, and unknown etiology of falls  are also affecting patient's functional outcome.   REHAB POTENTIAL: Good  CLINICAL DECISION MAKING: Evolving/moderate complexity  EVALUATION COMPLEXITY: Moderate  PLAN:  PT FREQUENCY: 2x/week  PT DURATION: 4 weeks  PLANNED INTERVENTIONS: Therapeutic exercises, Therapeutic activity, Neuromuscular re-education, Balance training, Gait training, Patient/Family education, Self Care, Stair training, Vestibular training, DME instructions, and Aquatic Therapy  PLAN FOR NEXT SESSION:  check updated HEP for any problems or ?'s:  continue strengthening exercises RLE  and balance training, dynamic gait   Cherril Hett, Donavan Burnet, PT 02/06/2023, 11:54 AM

## 2023-02-10 ENCOUNTER — Ambulatory Visit: Payer: Medicare Other | Admitting: Physical Therapy

## 2023-02-10 DIAGNOSIS — R2681 Unsteadiness on feet: Secondary | ICD-10-CM

## 2023-02-10 DIAGNOSIS — M6281 Muscle weakness (generalized): Secondary | ICD-10-CM

## 2023-02-10 DIAGNOSIS — R2689 Other abnormalities of gait and mobility: Secondary | ICD-10-CM

## 2023-02-11 ENCOUNTER — Inpatient Hospital Stay: Payer: Medicare Other | Attending: Hematology

## 2023-02-11 ENCOUNTER — Encounter: Payer: Self-pay | Admitting: Physical Therapy

## 2023-02-11 DIAGNOSIS — N1831 Chronic kidney disease, stage 3a: Secondary | ICD-10-CM | POA: Insufficient documentation

## 2023-02-11 DIAGNOSIS — Z9221 Personal history of antineoplastic chemotherapy: Secondary | ICD-10-CM | POA: Diagnosis not present

## 2023-02-11 DIAGNOSIS — Z9011 Acquired absence of right breast and nipple: Secondary | ICD-10-CM | POA: Insufficient documentation

## 2023-02-11 DIAGNOSIS — M8580 Other specified disorders of bone density and structure, unspecified site: Secondary | ICD-10-CM | POA: Insufficient documentation

## 2023-02-11 DIAGNOSIS — Z806 Family history of leukemia: Secondary | ICD-10-CM | POA: Diagnosis not present

## 2023-02-11 DIAGNOSIS — Z800 Family history of malignant neoplasm of digestive organs: Secondary | ICD-10-CM | POA: Insufficient documentation

## 2023-02-11 DIAGNOSIS — D509 Iron deficiency anemia, unspecified: Secondary | ICD-10-CM | POA: Insufficient documentation

## 2023-02-11 DIAGNOSIS — Z853 Personal history of malignant neoplasm of breast: Secondary | ICD-10-CM | POA: Diagnosis not present

## 2023-02-11 DIAGNOSIS — C50919 Malignant neoplasm of unspecified site of unspecified female breast: Secondary | ICD-10-CM

## 2023-02-11 LAB — COMPREHENSIVE METABOLIC PANEL
ALT: 22 U/L (ref 0–44)
AST: 22 U/L (ref 15–41)
Albumin: 3.6 g/dL (ref 3.5–5.0)
Alkaline Phosphatase: 57 U/L (ref 38–126)
Anion gap: 10 (ref 5–15)
BUN: 10 mg/dL (ref 8–23)
CO2: 26 mmol/L (ref 22–32)
Calcium: 8.9 mg/dL (ref 8.9–10.3)
Chloride: 97 mmol/L — ABNORMAL LOW (ref 98–111)
Creatinine, Ser: 1.14 mg/dL — ABNORMAL HIGH (ref 0.44–1.00)
GFR, Estimated: 50 mL/min — ABNORMAL LOW (ref >60)
Glucose, Bld: 401 mg/dL — ABNORMAL HIGH (ref 70–99)
Potassium: 3.1 mmol/L — ABNORMAL LOW (ref 3.5–5.1)
Sodium: 133 mmol/L — ABNORMAL LOW (ref 135–145)
Total Bilirubin: 0.6 mg/dL (ref 0.3–1.2)
Total Protein: 7.4 g/dL (ref 6.5–8.1)

## 2023-02-11 LAB — CBC WITH DIFFERENTIAL/PLATELET
Abs Immature Granulocytes: 0.01 10*3/uL (ref 0.00–0.07)
Basophils Absolute: 0.1 10*3/uL (ref 0.0–0.1)
Basophils Relative: 1 %
Eosinophils Absolute: 0.2 10*3/uL (ref 0.0–0.5)
Eosinophils Relative: 2 %
HCT: 35.1 % — ABNORMAL LOW (ref 36.0–46.0)
Hemoglobin: 11.3 g/dL — ABNORMAL LOW (ref 12.0–15.0)
Immature Granulocytes: 0 %
Lymphocytes Relative: 33 %
Lymphs Abs: 2.9 10*3/uL (ref 0.7–4.0)
MCH: 25.7 pg — ABNORMAL LOW (ref 26.0–34.0)
MCHC: 32.2 g/dL (ref 30.0–36.0)
MCV: 80 fL (ref 80.0–100.0)
Monocytes Absolute: 0.7 10*3/uL (ref 0.1–1.0)
Monocytes Relative: 8 %
Neutro Abs: 4.8 10*3/uL (ref 1.7–7.7)
Neutrophils Relative %: 56 %
Platelets: 241 10*3/uL (ref 150–400)
RBC: 4.39 MIL/uL (ref 3.87–5.11)
RDW: 15.4 % (ref 11.5–15.5)
WBC: 8.6 10*3/uL (ref 4.0–10.5)
nRBC: 0 % (ref 0.0–0.2)

## 2023-02-11 LAB — IRON AND TIBC
Iron: 40 ug/dL (ref 28–170)
Saturation Ratios: 11 % (ref 10.4–31.8)
TIBC: 363 ug/dL (ref 250–450)
UIBC: 323 ug/dL

## 2023-02-11 LAB — FERRITIN: Ferritin: 17 ng/mL (ref 11–307)

## 2023-02-11 LAB — VITAMIN D 25 HYDROXY (VIT D DEFICIENCY, FRACTURES): Vit D, 25-Hydroxy: 32.17 ng/mL (ref 30–100)

## 2023-02-11 NOTE — Therapy (Signed)
OUTPATIENT PHYSICAL THERAPY NEURO TREATMENT NOTE/AQUATIC THERAPY     Patient Name: [name] MRN: [id number] DOB:Jul 17, 1946, 77 y.o., female Today's Date: 02/11/2023   PCP: Anabel Halon, MD REFERRING PROVIDER: Windell Norfolk, MD  END OF SESSION:  PT End of Session - 02/11/23 2000     Visit Number 15    Number of Visits 22   14 + 8 additional = 22   Date for PT Re-Evaluation 03/07/23    Authorization Type [employer]    Authorization Time Period 12-09-22 - 02-21-23; renewal 5-16 - 03-07-23    PT Start Time 1440    PT Stop Time 1530    PT Time Calculation (min) 50 min    Equipment Utilized During Treatment Other (comment)   bar bells, aquatic cuffs   Activity Tolerance Patient tolerated treatment well    Behavior During Therapy WFL for tasks assessed/performed                 Past Medical History:  Diagnosis Date   (HFpEF) heart failure with preserved ejection fraction (HCC)    Abdominal hernia 02/26/2012   unrepaired   Adenocarcinoma of breast (HCC) 1997   right / chemo + tamoxifen x 5 years    Anemia    Anxiety    Aortic atherosclerosis (HCC)    Arthritis    Blood transfusion 1980's   Breast cancer (HCC)    Cancer (HCC)    [employer] 08/18/2020   Complication of anesthesia    has a hard time waking up; can't lay flat   Degenerative disc disease, lumbar    pressing on L3 and L4   Diabetes mellitus (HCC) 1989   Type 2 NIDDM; "cancer treatment gave me diabetes"   Diabetes mellitus without complication (HCC)    [employer] 08/18/2020   Diverticulosis    DJD (degenerative joint disease) of lumbar spine    Dysrhythmia    "irregular"   Exertional dyspnea    Gastroparesis    GERD (gastroesophageal reflux disease)    mann   Heart murmur    "think I outgrew it"   Hepatic steatosis    History of kidney stones    History of stomach ulcers    Hx: UTI (urinary tract infection)    Hyperlipidemia    Hypersensitivity    in tongue   Hypertension    IBS  (irritable bowel syndrome)    Insomnia    Internal hemorrhoids    Kidney cysts    RT KIDNEY   Kidney stones 2009   s/p lithotripsy   Mild CAD    Mild valvular heart disease    Nephrolithiasis 04/13/2014   Obesity    PFO (patent foramen ovale)    PONV (postoperative nausea and vomiting)    Pulmonary nodule    Shortness of breath    unable to lie flat   Ventral hernia    Past Surgical History:  Procedure Laterality Date   ABDOMINAL HYSTERECTOMY  2002   APPENDECTOMY     BACK SURGERY     Can't take any medical procedure in right arm   BREAST BIOPSY     right   BREAST SURGERY N/A    [employer] 04/22/2020   CATARACT EXTRACTION W/ INTRAOCULAR LENS  IMPLANT, BILATERAL  2009-2010   CESAREAN SECTION  1973; 1978; 1981   CESAREAN SECTION N/A    [employer] 04/22/2020   COLONOSCOPY WITH PROPOFOL N/A 10/29/2016   Procedure: COLONOSCOPY WITH PROPOFOL;  Surgeon: West Bali, MD;  Location: [facility];  Service: Endoscopy;  Laterality: N/A;  10:00 am   CYSTOSCOPY Left 04/13/2014   Procedure: CYSTOSCOPY FLEXIBLE;  Surgeon: Crist Fat, MD;  Location: [facility];  Service: Urology;  Laterality: Left;  with STENT   ESOPHAGOGASTRODUODENOSCOPY (EGD) WITH PROPOFOL N/A 10/29/2016   Procedure: ESOPHAGOGASTRODUODENOSCOPY (EGD) WITH PROPOFOL;  Surgeon: West Bali, MD;  Location: [facility];  Service: Endoscopy;  Laterality: N/A;   EYE SURGERY     implant and screws put in the right lower jaw  11/2008   dental surgery   kidney blockage  ?1990's   " major surgery ;put kidney on pump for awhile"   LITHOTRIPSY     "several times"   LUMBAR FUSION  08/2010   MASTECTOMY  1997   right   NEPHROLITHOTOMY Left 04/13/2014   Procedure: LEFT PERCUTANEOUS NEPHROLITHOTOMY WITH SURGEON ACCESS;  Surgeon: Crist Fat, MD;  Location: [facility];  Service: Urology;  Laterality: Left;   OVARIAN CYST SURGERY     PARATHYROIDECTOMY  02/26/2012   Procedure: PARATHYROIDECTOMY;  Surgeon: Darletta Moll, MD;  Location:  [facility];  Service: ENT;;   SAVORY DILATION N/A 10/29/2016   Procedure: SAVORY DILATION;  Surgeon: West Bali, MD;  Location: [facility];  Service: Endoscopy;  Laterality: N/A;   SPINE SURGERY  2011   urological surgery for blocked ureter secondary to kidney stone     Patient Active Problem List   Diagnosis Date Noted   Gait abnormality 12/19/2022   Peripheral neuropathy 09/17/2022   Right foot pain 05/02/2022   Hypokalemia 05/02/2022   Overactive bladder 02/08/2022   Left knee pain 02/08/2022   Right leg swelling 02/08/2022   Encounter for general adult medical examination with abnormal findings 08/30/2021   Chronic fatigue 08/30/2021   Vitamin B12 deficiency 08/30/2021   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   Balance problem 12/26/2020   Thyroid nodule 01/16/2018   Overweight (BMI 25.0-29.9) 12/18/2017   Diabetic neuropathy (HCC) 10/13/2017   Genetic testing 11/28/2016   Esophageal dysphagia 09/30/2016   Ventral hernia 07/28/2015   Situational anxiety 04/26/2015   Thyromegaly 01/23/2015   Iron deficiency 06/17/2014   DDD (degenerative disc disease), lumbar 01/26/2014   Bulge of lumbar disc without myelopathy 01/26/2014   Renal cyst 01/26/2014   Back pain with left-sided sciatica 12/28/2013   Uncontrolled type 2 diabetes mellitus with hyperglycemia, without long-term current use of insulin (HCC) 08/01/2013   GASTROPARESIS 12/15/2008   GERD (gastroesophageal reflux disease) 06/21/2008   IBS (irritable bowel syndrome) 06/21/2008   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 (HCC) 02/09/2008   Hyperlipidemia 02/09/2008   Essential hypertension 02/09/2008   Insomnia 02/09/2008    ONSET DATE: May 2023:  Referral date 11-27-22  REFERRING DIAG: R26.9 (ICD-10-CM) - Gait abnormality W19.XXXD (ICD-10-CM) - Fall, subsequent encounter  THERAPY DIAG:  Unsteadiness on feet  Muscle weakness (generalized)  Other abnormalities of gait and mobility  Rationale for Evaluation and Treatment:  Rehabilitation  SUBJECTIVE:  SUBJECTIVE STATEMENT: Pt reports she is doing well - had revival at church this weekend so she is a little tired today; states she continues to walk without her cane.  Reports she is doing better with moving her Rt leg from gas pedal to brake pedal during driving. Pt accompanied by: self  PERTINENT HISTORY: This is a 77 year old woman with past medical history of breast cancer, s/p chemotherapy, hypertension, diabetes, who is presenting with multiple falls. Patient reports since May of last year, she has a total of 6 falls resulting in torn ligament, and bruises. She denies any problems prior to the fall, no warning signs, no dizziness, no lightheadedness  PAIN:  Are you having pain? Yes: NPRS scale: 2/10 Pain location: Rt quads Aggravating factors: no specific factors Relieving factors: ice and Advil  PRECAUTIONS: Fall  WEIGHT BEARING RESTRICTIONS: No  FALLS: Has patient fallen in last 6 months? Yes. Number of falls approx. 3   LIVING ENVIRONMENT: Lives with: lives with their spouse and lives with their daughter Lives in: House/apartment Stairs: Yes: Internal: 12 steps; on right going up and External: 1 steps; none pt does not have to go up to 2nd level Has following equipment at home: Single point cane and Walker - 2 wheeled  PLOF: Independent  PATIENT GOALS: improve walking and leg strength   OBJECTIVE:  Aquatic therapy at [facility] - pool temp 90 degrees  Patient seen for aquatic therapy today.  Treatment took place in water 3.6-4.5 feet deep depending upon activity.  Pt entered and exited the pool via step negotiation using bil. Hand rails with supervision.  Pt performed water walking for warm up - forwards, backwards, and sideways -- 4 laps (18' each  direction) - pt held pool noodle for assist with balance and stability  Marching in place 10 reps each leg with UE support on pool noodle; marching forwards 18' x 2 reps with UE support on pool noodle; backwards marching 18' x 1 rep with UE support on pool noodle  Aquatic step used - step ups 10 reps RLE and then LLE with minimal UE support on pool edge Alternate tap ups to aquatic step 10 reps each foot without UE support on pool edge with SBA to CGA  Squats bil. LE's 10 reps  Rt hip AROM exercises - hip flexion, abduction, extension with use of aquatic cuff 20 reps for increased resistance with the eccentric contraction; hip abdct./adduction with knee flexed at 90 degrees 20 reps with use of aquatic cuff for increased resistance; Rt hip adduction/abduction with use of aquatic cuff 20 reps with UE support on pool edge  Pt performed forward ambulation with bar bells in each hand 18' x 4 reps at end of session with cues for incr. Step length  Pt requires buoyancy of water for support for reduced fall risk and for unloading/reduced stress on joints as pt able to tolerate increased standing and ambulation in water compared to that on land; viscosity of water is needed for resistance for strengthening and current of water provides perturbations for challenge for balance training            12-18-22: Pt instructed in HEP for bil. LE strengthening - [facility] HEP [email or web address]; pt performed exercises listed below 10 reps each leg; resistance used was red theraband  Access Code: [email or web address] URL: [email or web address] Date: 12/18/2022 Prepared by: Maebelle Munroe  Exercises - Supine Bridge  - 1 x daily - 7 x weekly - 1 sets - 10 reps -  5 sec hold - Single Leg Bridge  - 1 x daily - 7 x weekly - 1 sets - 10 reps - 3 sec hold - Sidelying Hip Abduction  - 1 x daily - 7 x weekly - 3 sets - 10 reps - Clamshell  - 1 x daily - 7 x weekly - 1 sets - 10 reps - Supine Hip Flexion  - 1 x daily - 7  x weekly - 1 sets - 10 reps - 3 sec hold - Hooklying Clamshell with Resistance  - 1 x daily - 7 x weekly - 1 sets - 10 reps - Standing Hip Extension with Resistance at Ankles and Counter Support  - 1 x daily - 7 x weekly - 1 sets - 10 reps - 3 sec hold hold - Sit to Stand  - 1 x daily - 7 x weekly - 1 sets - 10 reps      PATIENT EDUCATION: Education details: [facility] HEP;   12-24-22 - added piriformis stretch and heel raises to HEP  Person educated: Patient Education method: Explanation Education comprehension: verbalized understanding  HOME EXERCISE PROGRAM: To be established  GOALS: Goals reviewed with patient? Yes   SHORT TERM GOALS: Target date: 01-10-23   Improve 5x sit to stand score to </= 23 secs with RUE support from standard chair. Baseline:  28.06 secs:   19.53 secs with RUE support  Goal status: Goal met 01-06-23  2.  Amb. 250' with RW with SBA for increased community accessibility.  Baseline:  Goal status: Goal met   3. Improve gait velocity by at least .3 ft/sec with use of SPC for increased gait efficiency. Baseline: 12.97 secs no device on 01-06-23 Goal status: Ongoing - 01-06-23  4.  Improve TUG score to </= 19 secs with SPC for reduced fall risk. Baseline: 22,31 secs with SPC;   14.25 secs Goal status: Goal met 01-06-23  5.  Independent in HEP for bil. LE strengthening and balance exercises.  Baseline:  Goal status: Goal met    LONG TERM GOALS: Target date: 02-07-23  Improve 5x sit to stand score to </= 20 secs with RUE support from standard chair. Baseline:  28.06 secs;   02-06-23 = 19.16 secs without UE support Goal status: Goal met   2.  Amb. 460' with RW with SBA for increased community accessibility.  Baseline: goal met with no device Goal status: Goal met 02-06-23  3.   Improve gait velocity by at least .6 ft/sec with use of SPC for increased gait efficiency. Baseline: 10.34 secs no device = 3.17 ft/sec without device Goal status: Goal met  02-06-23  4.   Improve TUG score to </= 15 secs with SPC for reduced fall risk. Baseline: 22.31 secs; 12.37 secs without device - 02-06-23 Goal status: Goal met 02-06-23  5.  Independent in updated HEP for balance and strengthening. Baseline:  Goal status: Ongoing as new balance exercises added to HEP on 02-06-23    NEW LONG TERM GOALS: Target date: 03-07-23   1.  Improve 5x sit to stand score to </= 15 secs without support from standard chair. Baseline:  28.06 secs;   02-06-23 = 19.16 secs without UE support Goal status: Revised/upgraded  2.  Amb. 52' with horizontal head turns without LOB for increased independence and safety with environmental scanning while walking.    Baseline:  unsteadiness and LOB with amb. With head turns    Goal status:  NEW    3.  Increase gait velocity to >/= 3.4 ft/sec without use of assistive device for increased efficiency with gait.     Baseline;  3.17 ft/sec with no device on 02-06-23    Goal status:  Revised/Upgraded   4.  Pt will demonstrate improved vestibular input in maintaining balance by standing in Romberg position on floor with EC for at least  30 secs without LOB or need for UE support on counter.    Baseline; stood for 30 secs with approx. 5 touches on counter for assist with balance recovery    Goal status:  NEW  5.  Demonstrate improved SLS balance by standing for at least 5 secs on RLE and also on LLE to reduce fall risk and to increase safety with stepping over objects.           Baseline:  RLE SLS = 3.94 secs:  LLE SLS = 4.10 secs Goal status:  NEW  6.  Transfer floor to stand with supervision with minimal UE support on external object.  Baseline:  TBA  Goal status: NEW  7.  Independent in updated HEP.  Baseline:  added new balance exercises on 02-06-23  Goal status:  ONGOING     ASSESSMENT:  CLINICAL IMPRESSION: Aquatic PT session focused on gait training with UE support on large pool noodle for stability at start of session  and on bil. LE strengthening with use of aquatic cuff for increased resistance with the eccentric contraction.  Increased # reps to 20 for hip strengthening exercises in standing.  Pt tolerated aquatic exercise well; able to amb. With bar bells in each hand for improved coordination during gait demonstrating improved balance with water walking.  Pt is progressing well towards goals. Cont with POC.   OBJECTIVE IMPAIRMENTS: Abnormal gait, decreased activity tolerance, decreased balance, decreased strength, dizziness, impaired sensation, and pain.   ACTIVITY LIMITATIONS: carrying, bending, standing, squatting, stairs, and locomotion level  PARTICIPATION LIMITATIONS: meal prep, cleaning, laundry, driving, shopping, and community activity  PERSONAL FACTORS: Past/current experiences, Time since onset of injury/illness/exacerbation, and unknown etiology of falls  are also affecting patient's functional outcome.   REHAB POTENTIAL: Good  CLINICAL DECISION MAKING: Evolving/moderate complexity  EVALUATION COMPLEXITY: Moderate  PLAN:  PT FREQUENCY: 2x/week  PT DURATION: 8 weeks  PLANNED INTERVENTIONS: Therapeutic exercises, Therapeutic activity, Neuromuscular re-education, Balance training, Gait training, Patient/Family education, Self Care, Stair training, Vestibular training, DME instructions, and Aquatic Therapy  PLAN FOR NEXT SESSION:   continue strengthening exercises   Ura Hausen, Donavan Burnet, PT 02/11/2023, 8:04 PM

## 2023-02-13 ENCOUNTER — Ambulatory Visit: Payer: Medicare Other | Admitting: Physical Therapy

## 2023-02-13 DIAGNOSIS — R2689 Other abnormalities of gait and mobility: Secondary | ICD-10-CM

## 2023-02-13 DIAGNOSIS — M6281 Muscle weakness (generalized): Secondary | ICD-10-CM

## 2023-02-13 DIAGNOSIS — R2681 Unsteadiness on feet: Secondary | ICD-10-CM

## 2023-02-13 NOTE — Therapy (Signed)
OUTPATIENT PHYSICAL THERAPY NEURO TREATMENT NOTE   Patient Name: [name] MRN: [id number] DOB:[birth year], 77 y.o., female Today's Date: 02/14/2023   PCP: Anabel Halon, MD REFERRING PROVIDER: Windell Norfolk, MD  END OF SESSION:  PT End of Session - 02/14/23 1120     Visit Number 16    Number of Visits 22   14 + 8 additional = 22   Date for PT Re-Evaluation 03/07/23    Authorization Type [employer]    Authorization Time Period 12-09-22 - 02-21-23; renewal 5-16 - 03-07-23    PT Start Time 1102    PT Stop Time 1146    PT Time Calculation (min) 44 min    Equipment Utilized During Treatment Gait belt    Activity Tolerance Patient tolerated treatment well    Behavior During Therapy WFL for tasks assessed/performed                     Past Medical History:  Diagnosis Date   (HFpEF) heart failure with preserved ejection fraction (HCC)    Abdominal hernia 02/26/2012   unrepaired   Adenocarcinoma of breast (HCC) 1997   right / chemo + tamoxifen x 5 years    Anemia    Anxiety    Aortic atherosclerosis (HCC)    Arthritis    Blood transfusion 1980's   Breast cancer (HCC)    Cancer (HCC)    [employer] 08/18/2020   Complication of anesthesia    has a hard time waking up; can't lay flat   Degenerative disc disease, lumbar    pressing on L3 and L4   Diabetes mellitus (HCC) 1989   Type 2 NIDDM; "cancer treatment gave me diabetes"   Diabetes mellitus without complication (HCC)    [employer] 08/18/2020   Diverticulosis    DJD (degenerative joint disease) of lumbar spine    Dysrhythmia    "irregular"   Exertional dyspnea    Gastroparesis    GERD (gastroesophageal reflux disease)    mann   Heart murmur    "think I outgrew it"   Hepatic steatosis    History of kidney stones    History of stomach ulcers    Hx: UTI (urinary tract infection)    Hyperlipidemia    Hypersensitivity    in tongue   Hypertension    IBS (irritable bowel syndrome)    Insomnia     Internal hemorrhoids    Kidney cysts    RT KIDNEY   Kidney stones 2009   s/p lithotripsy   Mild CAD    Mild valvular heart disease    Nephrolithiasis 04/13/2014   Obesity    PFO (patent foramen ovale)    PONV (postoperative nausea and vomiting)    Pulmonary nodule    Shortness of breath    unable to lie flat   Ventral hernia    Past Surgical History:  Procedure Laterality Date   ABDOMINAL HYSTERECTOMY  2002   APPENDECTOMY     BACK SURGERY     Can't take any medical procedure in right arm   BREAST BIOPSY     right   BREAST SURGERY N/A    [employer] 04/22/2020   CATARACT EXTRACTION W/ INTRAOCULAR LENS  IMPLANT, BILATERAL  2009-2010   CESAREAN SECTION  1973; 1978; 1981   CESAREAN SECTION N/A    [employer] 04/22/2020   COLONOSCOPY WITH PROPOFOL N/A 10/29/2016   Procedure: COLONOSCOPY WITH PROPOFOL;  Surgeon: West Bali, MD;  Location: [facility];  Service: Endoscopy;  Laterality: N/A;  10:00 am   CYSTOSCOPY Left 04/13/2014   Procedure: CYSTOSCOPY FLEXIBLE;  Surgeon: Crist Fat, MD;  Location: [facility];  Service: Urology;  Laterality: Left;  with STENT   ESOPHAGOGASTRODUODENOSCOPY (EGD) WITH PROPOFOL N/A 10/29/2016   Procedure: ESOPHAGOGASTRODUODENOSCOPY (EGD) WITH PROPOFOL;  Surgeon: West Bali, MD;  Location: [facility];  Service: Endoscopy;  Laterality: N/A;   EYE SURGERY     implant and screws put in the right lower jaw  11/2008   dental surgery   kidney blockage  ?1990's   " major surgery ;put kidney on pump for awhile"   LITHOTRIPSY     "several times"   LUMBAR FUSION  08/2010   MASTECTOMY  1997   right   NEPHROLITHOTOMY Left 04/13/2014   Procedure: LEFT PERCUTANEOUS NEPHROLITHOTOMY WITH SURGEON ACCESS;  Surgeon: Crist Fat, MD;  Location: [facility];  Service: Urology;  Laterality: Left;   OVARIAN CYST SURGERY     PARATHYROIDECTOMY  02/26/2012   Procedure: PARATHYROIDECTOMY;  Surgeon: Darletta Moll, MD;  Location: [facility];  Service: ENT;;   SAVORY DILATION  N/A 10/29/2016   Procedure: SAVORY DILATION;  Surgeon: West Bali, MD;  Location: [facility];  Service: Endoscopy;  Laterality: N/A;   SPINE SURGERY  2011   urological surgery for blocked ureter secondary to kidney stone     Patient Active Problem List   Diagnosis Date Noted   Gait abnormality 12/19/2022   Peripheral neuropathy 09/17/2022   Right foot pain 05/02/2022   Hypokalemia 05/02/2022   Overactive bladder 02/08/2022   Left knee pain 02/08/2022   Right leg swelling 02/08/2022   Encounter for general adult medical examination with abnormal findings 08/30/2021   Chronic fatigue 08/30/2021   Vitamin B12 deficiency 08/30/2021   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   Balance problem 12/26/2020   Thyroid nodule 01/16/2018   Overweight (BMI 25.0-29.9) 12/18/2017   Diabetic neuropathy (HCC) 10/13/2017   Genetic testing 11/28/2016   Esophageal dysphagia 09/30/2016   Ventral hernia 07/28/2015   Situational anxiety 04/26/2015   Thyromegaly 01/23/2015   Iron deficiency 06/17/2014   DDD (degenerative disc disease), lumbar 01/26/2014   Bulge of lumbar disc without myelopathy 01/26/2014   Renal cyst 01/26/2014   Back pain with left-sided sciatica 12/28/2013   Uncontrolled type 2 diabetes mellitus with hyperglycemia, without long-term current use of insulin (HCC) 08/01/2013   GASTROPARESIS 12/15/2008   GERD (gastroesophageal reflux disease) 06/21/2008   IBS (irritable bowel syndrome) 06/21/2008   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 (HCC) 02/09/2008   Hyperlipidemia 02/09/2008   Essential hypertension 02/09/2008   Insomnia 02/09/2008    ONSET DATE: May 2023:  Referral date 11-27-22  REFERRING DIAG: R26.9 (ICD-10-CM) - Gait abnormality W19.XXXD (ICD-10-CM) - Fall, subsequent encounter  THERAPY DIAG:  Unsteadiness on feet  Muscle weakness (generalized)  Other abnormalities of gait and mobility  Rationale for Evaluation and Treatment: Rehabilitation  SUBJECTIVE:  SUBJECTIVE STATEMENT: Pt reports she continues to do well - states she worked in her yard some on Wednesday - walked in the grass without difficulty but says the grass is not real thick so she doesn't have a problem with keeping her balance Pt accompanied by: self  PERTINENT HISTORY: This is a 77 year old woman with past medical history of breast cancer, s/p chemotherapy, hypertension, diabetes, who is presenting with multiple falls. Patient reports since May of last year, she has a total of 6 falls resulting in torn ligament, and bruises. She denies any prodromes prior to the fall, no warning signs, no dizziness, no lightheadedness  PAIN:  Are you having pain? Yes: NPRS scale: 4-5/10 Pain location: Rt quads Aggravating factors: no specific factors Relieving factors: ice and Advil  PRECAUTIONS: Fall  WEIGHT BEARING RESTRICTIONS: No  FALLS: Has patient fallen in last 6 months? Yes. Number of falls approx. 3   LIVING ENVIRONMENT: Lives with: lives with their spouse and lives with their daughter Lives in: House/apartment Stairs: Yes: Internal: 12 steps; on right going up and External: 1 steps; none pt does not have to go up to 2nd level Has following equipment at home: Single point cane and Walker - 2 wheeled  PLOF: Independent  PATIENT GOALS: improve walking and leg strength  Today's Treatment:   02-13-23   GAIT: Gait pattern: WFL's  Distance walked: approx. 230' total distance Assistive device utilized:  No device used Level of assistance: Supervision Comments: no LOB during gait training  Practiced amb. Fast/slow without device with CGA 115' around track Amb. Backwards 20' with EO with SBA - no LOB Pt performed forwards ambulation 30' x 1 rep tossing and catching ball, progressed to tossing and catching  ball on Rt/Lt sides 30' x 1 rep with CGA for improved multi-tasking with gait  NeuroRe-ed:   Tandem stance 10' with mod. Difficulty with min assist Amb. 20' x 2 reps with horizontal head turns with min to mod. Deviation in path Amb. 20' x 2 reps with vertical head turns with mod. Deviation in path Standing on Airex with min assist - head turns horizontally 5 reps and then vertically 5 reps with min assist Pt amb. 20' forwards with EC with CGA for increased vestibular input in maintaining balance  THEREX: Runner's stretch bil. LE's using 2nd step - 30 sec hold x 1 rep each leg;  gastroc stretch using bottom shelf of cabinet - 20 sec hold x 1 rep each leg Theraband exercises in standing for hip musculature strengthening for RLE: red theraband used for hip flexion with knee extended and with knee flexed 10 reps each, hip abduction 10 reps and hip extension 10 reps Step up exercise RLE 10 reps and then step down exercise for Rt quad eccentric strengthening 10 reps  Updated HEP to include balance exercises: Medbridge ; also added abrupt turn and stop and abrupt stop and turn to practice in home to have UE support prn Access Code: [email or web address] URL: [email or web address] Date: 02/06/2023 Prepared by: Maebelle Munroe  Exercises (BALANCE) - Single Leg Stance  - 1 x daily - 7 x weekly - 1 sets - 2 reps - 10 secs hold - Standing Tandem Balance with Counter Support  - 1 x daily - 7 x weekly - 1 sets - 2 reps - 30 secs hold - Romberg Stance  - 1 x daily - 7 x weekly - 1 sets - 1 reps  12-18-22: Pt instructed in HEP for bil. LE strengthening -  Medbridge HEP [email or web address]; pt performed exercises listed below 10 reps each leg; resistance used was red theraband  Access Code: [email or web address] URL: [email or web address] Date: 12/18/2022 Prepared by: Maebelle Munroe  Exercises - Supine Bridge  - 1 x daily - 7 x weekly - 1 sets - 10 reps - 5 sec hold - Single Leg Bridge  - 1 x daily - 7 x weekly -  1 sets - 10 reps - 3 sec hold - Sidelying Hip Abduction  - 1 x daily - 7 x weekly - 3 sets - 10 reps - Clamshell  - 1 x daily - 7 x weekly - 1 sets - 10 reps - Supine Hip Flexion  - 1 x daily - 7 x weekly - 1 sets - 10 reps - 3 sec hold - Hooklying Clamshell with Resistance  - 1 x daily - 7 x weekly - 1 sets - 10 reps - Standing Hip Extension with Resistance at Ankles and Counter Support  - 1 x daily - 7 x weekly - 1 sets - 10 reps - 3 sec hold hold - Sit to Stand  - 1 x daily - 7 x weekly - 1 sets - 10 reps      PATIENT EDUCATION: Education details: Medbridge HEP [email or web address] (02-06-23) Person educated: Patient Education method: Explanation demonstration and handouts Education comprehension: verbalized understanding and demonstrated understanding  HOME EXERCISE PROGRAM: To be established  GOALS: Goals reviewed with patient? Yes   SHORT TERM GOALS: Target date: 01-10-23  Improve 5x sit to stand score to </= 23 secs with RUE support from standard chair. Baseline:  28.06 secs:   19.53 secs with RUE support  Goal status: Goal met 01-06-23  2.  Amb. 250' with RW with SBA for increased community accessibility.  Baseline:  Goal status: Goal met   3. Improve gait velocity by at least .3 ft/sec with use of SPC for increased gait efficiency. Baseline: 12.97 secs no device on 01-06-23 Goal status: Ongoing - 01-06-23  4.  Improve TUG score to </= 19 secs with SPC for reduced fall risk. Baseline: 22,31 secs with SPC;   14.25 secs Goal status: Goal met 01-06-23  5.  Independent in HEP for bil. LE strengthening and balance exercises.  Baseline:  Goal status: Goal met    LONG TERM GOALS: Target date: 02-07-23  Improve 5x sit to stand score to </= 20 secs with RUE support from standard chair. Baseline:  28.06 secs;   02-06-23 = 19.16 secs without UE support Goal status: Goal met   2.  Amb. 460' with RW with SBA for increased community accessibility.  Baseline: goal met with no device Goal  status: Goal met 02-06-23  3.   Improve gait velocity by at least .6 ft/sec with use of SPC for increased gait efficiency. Baseline: 10.34 secs no device = 3.17 ft/sec without device Goal status: Goal met 02-06-23  4.   Improve TUG score to </= 15 secs with SPC for reduced fall risk. Baseline: 22.31 secs; 12.37 secs without device - 02-06-23 Goal status: Goal met 02-06-23  5.  Independent in updated HEP for balance and strengthening. Baseline:  Goal status: Ongoing as new balance exercises added to HEP on 02-06-23    NEW LONG TERM GOALS: Target date: 03-07-23   1.  Improve 5x sit to stand score to </= 15 secs without support from standard chair. Baseline:  28.06 secs;   02-06-23 = 19.16 secs without UE support Goal status: Revised/upgraded  2.  Amb. 81' with horizontal head turns without LOB for increased independence and safety with environmental scanning while walking.    Baseline:  unsteadiness and LOB with amb. With head turns    Goal status:  NEW    3.  Increase gait velocity to >/= 3.4 ft/sec without use of assistive device for increased efficiency with gait.     Baseline;  3.17 ft/sec with no device on 02-06-23    Goal status:  Revised/Upgraded   4.  Pt will demonstrate improved vestibular input in maintaining balance by standing in Romberg position on floor with EC for at least  30 secs without LOB or need for UE support on counter.    Baseline; stood for 30 secs with approx. 5 touches on counter for assist with balance recovery    Goal status:  NEW  5.  Demonstrate improved SLS balance by standing for at least 5 secs on RLE and also on LLE to reduce fall risk and to increase safety with stepping over objects.           Baseline:  RLE SLS = 3.94 secs:  LLE SLS = 4.10 secs Goal status:  NEW  6.  Transfer floor to stand with supervision with minimal UE support on external object.  Baseline:  TBA  Goal status: NEW  7.  Independent in updated HEP.  Baseline:  added new balance  exercises on 02-06-23  Goal status:  ONGOING  ASSESSMENT:  CLINICAL IMPRESSION: PT session focused on Rt hip strengthening and stretching exercises with use of red theraband for PRE's and also on balance and gait training with increased vestibular input incorporated.  Pt had more unsteadiness and deviation in path with ambulating with vertical head turns than with horizontal head turns with need for CGA for safety due to postural instability with this acitivity.  Pt able to perform hip strengthening exercises with red theraband with no c/o pain in Rt hip or thigh region.  Pt is progressing well towards LTG's.  Cont with POC.     OBJECTIVE IMPAIRMENTS: Abnormal gait, decreased activity tolerance, decreased balance, decreased strength, dizziness, impaired sensation, and pain.   ACTIVITY LIMITATIONS: carrying, bending, standing, squatting, stairs, and locomotion level  PARTICIPATION LIMITATIONS: meal prep, cleaning, laundry, driving, shopping, and community activity  PERSONAL FACTORS: Past/current experiences, Time since onset of injury/illness/exacerbation, and unknown etiology of falls  are also affecting patient's functional outcome.   REHAB POTENTIAL: Good  CLINICAL DECISION MAKING: Evolving/moderate complexity  EVALUATION COMPLEXITY: Moderate  PLAN:  PT FREQUENCY: 2x/week  PT DURATION: 4 weeks  PLANNED INTERVENTIONS: Therapeutic exercises, Therapeutic activity, Neuromuscular re-education, Balance training, Gait training, Patient/Family education, Self Care, Stair training, Vestibular training, DME instructions, and Aquatic Therapy  PLAN FOR NEXT SESSION:  continue strengthening exercises RLE and balance training, dynamic gait   Daionna Crossland, Donavan Burnet, PT 02/14/2023, 11:23 AM

## 2023-02-14 ENCOUNTER — Encounter: Payer: Self-pay | Admitting: Physical Therapy

## 2023-02-14 NOTE — Progress Notes (Unsigned)
[facility] [address], [place] [address]   CLINIC:  Medical Oncology/Hematology  PCP:  Anabel Halon, MD [address] / [place] [address] [phone]   CHIEF COMPLIANT: Follow-up for stage IA (T1cN0M0) right breast cancer, ER+/PR+ (diagnosed in 1997)   BRIEF ONCOLOGIC HISTORY:   Oncology History  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 (HCC)  02/09/2008 Initial Diagnosis   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 (HCC)   07/06/2014 Genetic Testing   Testing was normal and did not reveal a mutation in these genes. The genes tested were ATM, BARD1, BRCA1, BRCA2, BRIP1, CDH1, CHEK2, MRE11A, MUTYH, NBN, NF1, PALB2, PTEN, RAD50, RAD51C, RAD51D, and TP53.    CANCER STAGING: Cancer Staging  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 [facility]) Staging form: Breast, AJCC 6th Edition - Clinical: Stage I (T1c, N0, M0) - Signed by Ellouise Newer, PA-C on 05/21/2013   INTERVAL HISTORY:   [name], a 77 y.o. female, returns for routine follow-up of her history of right-sided breast cancer, which was diagnosed in 72. Ivry was last seen on 02/19/2022 by Dr. Ellin Saba.   At today's visit, she  reports feeling fair.  She has had increasing muscle weakness and falls, which is being followed by her PCP and neurologist.  She has been going to outpatient physical therapy.  She denies any symptoms of recurrence such as new lumps, bone pain, chest pain, dyspnea, or abdominal pain.  She has no new headaches, seizures, or focal neurologic deficits.  No B symptoms such as fever, chills, night sweats, unintentional weight loss.  She has some ongoing aching in her right arm with intermittent lymphedema.   She denies any frank melena or hematochezia, but does have intermittent "dark blackish bowel movements."  She admits to fatigue, pica, dyspnea on exertion, and lightheadedness without syncope.  She was previously taking iron tablet 3 times weekly, but stopped about a  month ago.  She continues to take vitamin D.  She reports 70% energy and 90% appetite.  She is maintaining stable weight at this time.  ASSESSMENT & PLAN:  1.  Stage Ia (T1cN0M0) right breast cancer, ER positive/PR positive: - Patient was diagnosed with right breast cancer in 1997. - Right breast cancer measuring 1.1 x 1.2 cm, grade 2, status post right mastectomy with regional axillary lymph node dissection on 10/17/1995; all 8 axillary lymph nodes were negative for metastatic disease. - She had adjuvant chemotherapy consisting of Adriamycin/Cytoxan x4 cycles. - Antiestrogen therapy with tamoxifen x5 years was completed in July 2002. - Last mammogram done on 02/11/2022 showed BI-RADS category 1 negative. - Most recent labs (02/11/2023): Baseline CBC and CMP.  LFTs are normal, and she has baseline mild CKD stage IIIa with GFR 50/creatinine 1.14. - Physical exam showed right mastectomy site within normal limits.  No masses or lymphadenopathy in left breast or bilateral axilla. - No symptoms concerning for recurrent cancer per patient report  - PLAN: Patient is medically eligible to discharge from clinic, but she prefers to follow at [facility] for labs and annual mammograms.   - She is due for annual mammogram. - RTC around June/July 2025 (after annual labs and mammogram in 2025)  2.  Iron deficiency anemia - Labs from 02/11/2023 show mild iron deficiency anemia, Hgb 11.3/MCV 80, ferritin 17, iron saturation 11%. - Colonoscopy (10/29/2016): Diverticulosis, internal hemorrhoids - EGD (10/29/2016): Dysphagia due to GERD/peptic stricture, mild gastritis - Per last visit with Dr. Ellin Saba, patient was instructed  to take iron tablets 3 times weekly (since May 2023).  Patient stopped taking these a few months ago. - She reports dark bowel movements, but denies any gross hematochezia or melena. - She reports fatigue, pica, DOE, and lightheadedness - PLAN: Check stool cards x 3.   - Patient instructed  to restart iron tablet DAILY - We will repeat labs (CBC and iron panel) in 6 months  3.  Bone health: - DEXA scan on 02/08/2021 T score -1.7, osteopenia. - Labs from 02/11/2023 show normal vitamin D32.17, normal calcium 8.9 - She is taking vitamin D3 (unknown dose) once daily - PLAN: She is due for bone density scan  - Continue vitamin D.  Recheck in 1 year (May 2025)  PLAN SUMMARY:  >> Check stool cards x 3 >> Due for annual screening mammogram >> Due for bone density scan >> Labs in 6 months = CBC/D, CMP, ferritin, iron/TIBC >> OFFICE visit in 6 months (after labs) to discuss iron deficiency anemia  ** Will CALL with results after mammogram and bone density scan     REVIEW OF SYSTEMS:   Review of Systems  Constitutional:  Positive for fatigue. Negative for appetite change, chills, diaphoresis, fever and unexpected weight change.  HENT:   Negative for lump/mass and nosebleeds.   Eyes:  Negative for eye problems.  Respiratory:  Positive for cough and shortness of breath. Negative for hemoptysis.   Cardiovascular:  Negative for chest pain, leg swelling and palpitations.  Gastrointestinal:  Positive for diarrhea and nausea. Negative for abdominal pain, blood in stool, constipation and vomiting.  Genitourinary:  Positive for bladder incontinence. Negative for hematuria.   Skin: Negative.   Neurological:  Positive for dizziness, light-headedness and numbness. Negative for headaches.  Hematological:  Does not bruise/bleed easily.  Psychiatric/Behavioral:  Positive for sleep disturbance. The patient is nervous/anxious.     PHYSICAL EXAM:   Performance status (ECOG): 1 - Symptomatic but completely ambulatory  There were no vitals filed for this visit. Wt Readings from Last 3 Encounters:  12/19/22 176 lb 3.2 oz (79.9 kg)  11/27/22 173 lb (78.5 kg)  10/11/22 173 lb 9.6 oz (78.7 kg)   Physical Exam Constitutional:      Appearance: Normal appearance. She is obese.  Cardiovascular:      Heart sounds: Murmur heard.  Pulmonary:     Breath sounds: Normal breath sounds.  Chest:       Comments: S/p right mastectomy. Left breast without any nodules or masses. No supraclavicular, axillary, or pectoral lymphadenopathy bilaterally. Neurological:     General: No focal deficit present.     Mental Status: Mental status is at baseline.  Psychiatric:        Behavior: Behavior normal. Behavior is cooperative.      PAST MEDICAL/SURGICAL HISTORY:  Past Medical History:  Diagnosis Date   (HFpEF) heart failure with preserved ejection fraction (HCC)    Abdominal hernia 02/26/2012   unrepaired   Adenocarcinoma of breast (HCC) 1997   right / chemo + tamoxifen x 5 years    Anemia    Anxiety    Aortic atherosclerosis (HCC)    Arthritis    Blood transfusion 1980's   Breast cancer (HCC)    Cancer (HCC)    [employer]reesia 08/18/2020   Complication of anesthesia    has a hard time waking up; can't lay flat   Degenerative disc disease, lumbar    pressing on L3 and L4   Diabetes mellitus (HCC) 1989  Type 2 NIDDM; "cancer treatment gave me diabetes"   Diabetes mellitus without complication (HCC)    [employer]reesia 08/18/2020   Diverticulosis    DJD (degenerative joint disease) of lumbar spine    Dysrhythmia    "irregular"   Exertional dyspnea    Gastroparesis    GERD (gastroesophageal reflux disease)    mann   Heart murmur    "think I outgrew it"   Hepatic steatosis    History of kidney stones    History of stomach ulcers    Hx: UTI (urinary tract infection)    Hyperlipidemia    Hypersensitivity    in tongue   Hypertension    IBS (irritable bowel syndrome)    Insomnia    Internal hemorrhoids    Kidney cysts    RT KIDNEY   Kidney stones 2009   s/p lithotripsy   Mild CAD    Mild valvular heart disease    Nephrolithiasis 04/13/2014   Obesity    PFO (patent foramen ovale)    PONV (postoperative nausea and vomiting)    Pulmonary nodule    Shortness of breath    unable  to lie flat   Ventral hernia    Past Surgical History:  Procedure Laterality Date   ABDOMINAL HYSTERECTOMY  2002   APPENDECTOMY     BACK SURGERY     Can't take any medical procedure in right arm   BREAST BIOPSY     right   BREAST SURGERY N/A    [employer]rees[employer] 04/22/2020   CATARACT EXTRACTION W/ INTRAOCULAR LENS  IMPLANT, BILATERAL  2009-2010   CESAREAN SECTION  1973; 1978; 1981   CESAREAN SECTION N/A    [employer]ia 04/22/2020   COLONOSCOPY WITH PROPOFOL N/A 10/29/2016   Procedure: COLONOSCOPY WITH PROPOFOL;  Surgeon: West Bali, MD;  [facility];  Service: Endoscopy;  Laterality: N/A;  10:00 am   CYSTOSCOPY Left 04/13/2014   Procedure: CYSTOSCOPY FLEXIBLE;  Surgeon: Crist Fat, MD;  Location: [facility];  Service: Urology;  Laterality: Left;  with STENT   ESOPHAGOGASTRODUODENOSCOPY (EGD) WITH PROPOFOL N/A 10/29/2016   Procedure: ESOPHAGOGASTRODUODENOSCOPY (EGD) WITH PROPOFOL;  Surgeon: West Bali, MD;  [facility]: [facility];  Service: Endoscopy;  Laterality: N/A;   EYE SURGERY     implant and screws put in the right lower jaw  11/2008   dental surgery   kidney blockage  ?1990's   " major surgery ;put kidney on pump for awhile"   LITHOTRIPSY     "several times"   LUMBAR FUSION  08/2010   MASTECTOMY  1997   right   NEPHROLITHOTOMY Left 04/13/2014   Procedure: LEFT PERCUTANEOUS NEPHROLITHOTOMY WITH SURGEON ACCESS;  Surgeon: Crist Fat, MD;  Location: [facility];  Service: Urology;  Laterality: Left;   OVARIAN CYST SURGERY     PARATHYROIDECTOMY  02/26/2012   Procedure: PARATHYROIDECTOMY;  Surgeon: Darletta Moll, MD;  Location: [facility] OR;  Service: ENT;;   SAVORY DILATION N/A 10/29/2016   Procedure: SAVORY DILATION;  Surgeon: West Bali, MD;  [facility];  Service: Endoscopy;  Laterality: N/A;   SPINE SURGERY  2011   urological surgery for blocked ureter secondary to kidney stone      SOCIAL HISTORY:  Social History   Socioeconomic History   Marital  status: Married    Spouse name: Not on file   Number of children: 3   Years of education: Not on file   Highest education level: Not  on file  Occupational History   Occupation: retired in 2011  Tobacco Use   Smoking status: Never   Smokeless tobacco: Never  Vaping Use   Vaping Use: Never used  Substance and Sexual Activity   Alcohol use: Yes    Alcohol/week: 0.0 standard drinks of alcohol    Comment: X2 DRINKS PER YEAR   Drug use: No   Sexual activity: Not Currently    Birth control/protection: Surgical  Other Topics Concern   Not on file  Social History Narrative   Not on file   Social Determinants of Health   Financial Resource Strain: Low Risk  (12/15/2022)   Overall Financial Resource Strain (CARDIA)    Difficulty of Paying Living Expenses: Not hard at all  Food Insecurity: No Food Insecurity (12/15/2022)   Hunger Vital Sign    Worried About Running Out of Food in the Last Year: Never true    Ran Out of Food in the Last Year: Never true  Transportation Needs: No Transportation Needs (12/15/2022)   PRAPARE - Administrator, Civil Service (Medical): No    Lack of Transportation (Non-Medical): No  Physical Activity: Unknown (12/15/2022)   Exercise Vital Sign    Days of Exercise per Week: 0 days    Minutes of Exercise per Session: Not on file  Stress: Stress Concern Present (12/15/2022)   [employer] of Occupational Health - Occupational Stress Questionnaire    Feeling of Stress : To some extent  Social Connections: Socially Integrated (12/15/2022)   Social Connection and Isolation Panel [NHANES]    Frequency of Communication with Friends and Family: More than three times a week    Frequency of Social Gatherings with Friends and Family: Once a week    Attends Religious Services: More than 4 times per year    Active Member of [employer] or Organizations: Yes    Attends Engineer, structural: 1 to 4 times per year    Marital Status: Married  Careers information officer Violence: Not on file    FAMILY HISTORY:  Family History  Problem Relation Age of Onset   Birth defects Mother    Heart attack Mother    Cancer Brother        Esophageal; smoker; deceased 80s   Colon cancer Brother    Diabetes Daughter    Leukemia Paternal Aunt        deceased 19s   Pancreatic cancer Paternal Aunt        deceased 59   Cancer Paternal Aunt        GI cancer; deceased 44s   Cancer Cousin        kidney cancer; deceased 48; pat first cousin; daughter of aunt w/ leukemia   Cancer Cousin        colon cancer @ 14; pat first cousin; son of aunt with GI cancer   Anesthesia problems Neg Hx     CURRENT MEDICATIONS:  Current Outpatient Medications  Medication Sig Dispense Refill   acetaminophen (TYLENOL) 325 MG tablet Take 325 mg by mouth every 6 (six) hours as needed for mild pain.     amLODipine (NORVASC) 5 MG tablet TAKE ONE TABLET BY MOUTH ONCE DAILY. 90 tablet 0   Blood Glucose Monitoring Suppl (BLOOD GLUCOSE SYSTEM PAK) KIT Use as directed to monitor FSBS 1x daily. Dx: E11.9 1 each 1   Cholecalciferol (VITAMIN D3 PO) Take 1 tablet by mouth daily.     CINNAMON PO Take 1 capsule by  mouth. Takes sometimes     diclofenac Sodium (VOLTAREN) 1 % GEL Apply 4 g topically 4 (four) times daily. 4 g 1   DULoxetine (CYMBALTA) 30 MG capsule Take 1 capsule (30 mg total) by mouth daily. 30 capsule 2   famotidine (PEPCID) 20 MG tablet Take 1 tablet (20 mg total) by mouth 2 (two) times daily. 60 tablet 5   furosemide (LASIX) 40 MG tablet Take 1 tablet (40 mg total) by mouth daily as needed (FOR SWELLING). 90 tablet 0   glipiZIDE (GLUCOTROL) 10 MG tablet TAKE (1) TABLET BY MOUTH TWICE DAILY BEFORE A MEAL 90 tablet 0   glucose blood (ACCU-CHEK GUIDE) test strip 1 each by Other route as needed for other. Use as instructed to check blood sugar daily. 100 each 3   LANCETS SUPER THIN 28G MISC Use to check blood sugar once daily. 100 each 3   metFORMIN (GLUCOPHAGE) 500 MG tablet Take  2 tablets (1,000 mg total) by mouth 2 (two) times daily with a meal. (Patient taking differently: Take 500 mg by mouth daily.) 290 tablet 0   metoprolol tartrate (LOPRESSOR) 25 MG tablet Take 1 tablet (25 mg total) by mouth 2 (two) times daily. 180 tablet 3   Multiple Vitamin (MULTIVITAMIN) tablet Take 1 tablet by mouth daily.     potassium chloride (KLOR-CON) 10 MEQ tablet Take 1 tablet (10 mEq total) by mouth daily. 90 tablet 1   triamcinolone (KENALOG) 0.025 % ointment Apply 1 Application topically 2 (two) times daily. 30 g 0   No current facility-administered medications for this visit.    ALLERGIES:  Allergies  Allergen Reactions   Demerol [Meperidine] Other (See Comments)    Lost consciousness   Bentyl [Dicyclomine Hcl] Swelling   Invokana [Canagliflozin] Hives   Metformin And Related Diarrhea   Protonix [Pantoprazole Sodium] Swelling   Statins Other (See Comments)    myalgia   Ace Inhibitors Cough    LABORATORY DATA:  I have reviewed the labs as listed.     Latest Ref Rng & Units 02/11/2023    1:29 PM 09/17/2022   11:35 AM 06/26/2022    2:40 PM  CBC  WBC 4.0 - 10.5 K/uL 8.6  8.5  10.6   Hemoglobin 12.0 - 15.0 g/dL 16.1  09.6  04.5   Hematocrit 36.0 - 46.0 % 35.1  32.1  35.1   Platelets 150 - 400 K/uL 241  240  293       Latest Ref Rng & Units 02/11/2023    1:29 PM 12/19/2022    3:47 PM 09/17/2022   11:35 AM  CMP  Glucose 70 - 99 mg/dL 409  811  914   BUN 8 - 23 mg/dL 10  13  12    Creatinine 0.44 - 1.00 mg/dL 7.82  9.56  2.13   Sodium 135 - 145 mmol/L 133  141  140   Potassium 3.5 - 5.1 mmol/L 3.1  3.8  3.9   Chloride 98 - 111 mmol/L 97  99  101   CO2 22 - 32 mmol/L 26  21  23    Calcium 8.9 - 10.3 mg/dL 8.9  9.6  9.1   Total Protein 6.5 - 8.1 g/dL 7.4  7.4  6.8   Total Bilirubin 0.3 - 1.2 mg/dL 0.6  0.6  0.4   Alkaline Phos 38 - 126 U/L 57  91  67   AST 15 - 41 U/L 22  19  18    ALT 0 -  44 U/L 22  20  12      DIAGNOSTIC IMAGING:  I have independently reviewed  the scans and discussed with the patient. MR Brain Wo Contrast  Result Date: 01/20/2023 CLINICAL DATA:  Recurrent falls, gait disturbance. Gait abnormality. EXAM: MRI HEAD WITHOUT CONTRAST TECHNIQUE: Multiplanar, multiecho pulse sequences of the brain and surrounding structures were obtained without intravenous contrast. COMPARISON:  Head CT October 24, 2022. MRI of the brain November 10, 2020. FINDINGS: Brain: No acute infarction, hemorrhage, hydrocephalus, extra-axial collection or mass lesion. Scattered foci of T2 hyperintensity are seen the and within the pons, nonspecific, not significantly changed when compared to prior MRI. Vascular: Normal flow voids. Skull and upper cervical spine: Normal marrow signal. Sinuses/Orbits: Negative. Other: A 15 mm nonspecific left parotid nodule, unchanged when compared to prior. Small T2 hyperintense lesions in the bilateral parotid glands could represent small cysts versus small intraparotid lymph nodes. IMPRESSION: 1. No acute intracranial abnormality. 2. Mild to moderate chronic microvascular ischemic changes of the white matter, stable when compared to prior MRI. Electronically Signed   By: Baldemar Lenis M.D.   On: 01/20/2023 16:44     WRAP UP:  All questions were answered. The patient knows to call the clinic with any problems, questions or concerns.  Medical decision making: Moderate  Time spent on visit: I spent 25 minutes counseling the patient face to face. The total time spent in the appointment was 40 minutes and more than 50% was on counseling.  Carnella Guadalajara, PA-C  02/18/2023 4:52 PM

## 2023-02-18 ENCOUNTER — Inpatient Hospital Stay (HOSPITAL_BASED_OUTPATIENT_CLINIC_OR_DEPARTMENT_OTHER): Payer: Medicare Other | Admitting: Physician Assistant

## 2023-02-18 VITALS — BP 137/86 | HR 92 | Temp 97.9°F | Resp 19 | Ht 66.5 in | Wt 171.0 lb

## 2023-02-18 DIAGNOSIS — D509 Iron deficiency anemia, unspecified: Secondary | ICD-10-CM | POA: Diagnosis not present

## 2023-02-18 DIAGNOSIS — C50919 Malignant neoplasm of unspecified site of unspecified female breast: Secondary | ICD-10-CM | POA: Diagnosis not present

## 2023-02-18 DIAGNOSIS — Z170 Estrogen receptor positive status [ER+]: Secondary | ICD-10-CM

## 2023-02-18 DIAGNOSIS — M8580 Other specified disorders of bone density and structure, unspecified site: Secondary | ICD-10-CM | POA: Diagnosis not present

## 2023-02-18 MED ORDER — FERROUS SULFATE 325 (65 FE) MG PO TBEC: 325.0000 mg | DELAYED_RELEASE_TABLET | Freq: Every day | ORAL | 3 refills | Status: DC

## 2023-02-18 NOTE — Patient Instructions (Signed)
[facility] at [facility] **VISIT SUMMARY & IMPORTANT INSTRUCTIONS **   You were seen today by Rojelio Brenner PA-C for your history of breast cancer.    HISTORY OF BREAST CANCER You did not have any evidence of returning breast cancer on your labs or physical exam today. You are due for your annual mammogram.  OSTEOPENIA Continue to take your calcium and vitamin D supplement to improve the strength of your bones.  You are due for bone density scan.  IRON DEFICIENCY ANEMIA Your blood and iron levels are lower than they should be. We will check stool cards x 3 to make sure you do not have any blood in your bowel movements. You will need to restart your iron supplement so that you are taking it EVERY DAY. We will recheck your iron levels and see you for follow-up visit in 6 months.  FOLLOW-UP APPOINTMENT: Office visit in 6 months, with labs done 1 week before  ** Thank you for trusting me with your healthcare!  I strive to provide all of my patients with quality care at each visit.  If you receive a survey for this visit, I would be so grateful to you for taking the time to provide feedback.  Thank you in advance!  ~ Etienne Millward                   Dr. Doreatha Massed   &   Rojelio Brenner, PA-C   - - - - - - - - - - - - - - - - - -    Thank you for choosing [facility] at [facility] to provide your oncology and hematology care.  To afford each patient quality time with our provider, please arrive at least 15 minutes before your scheduled appointment time.   If you have a lab appointment with the Cancer Center please come in thru the Main Entrance and check in at the main information desk.  You need to re-schedule your appointment should you arrive 10 or more minutes late.  We strive to give you quality time with our providers, and arriving late affects you and other patients whose appointments are after yours.  Also, if you no show three  or more times for appointments you may be dismissed from the clinic at the providers discretion.     Again, thank you for choosing [facility].  Our hope is that these requests will decrease the amount of time that you wait before being seen by our physicians.       _____________________________________________________________  Should you have questions after your visit to [facility], please contact our office at [phone] and follow the prompts.  Our office hours are 8:00 a.m. and 4:30 p.m. Monday - Friday.  Please note that voicemails left after 4:00 p.m. may not be returned until the following business day.  We are closed weekends and major holidays.  You do have access to a nurse 24-7, just call the main number to the clinic [phone] and do not press any options, hold on the line and a nurse will answer the phone.    For prescription refill requests, have your pharmacy contact our office and allow 72 hours.

## 2023-02-20 ENCOUNTER — Ambulatory Visit: Payer: Medicare Other | Admitting: Physical Therapy

## 2023-02-20 DIAGNOSIS — M6281 Muscle weakness (generalized): Secondary | ICD-10-CM

## 2023-02-20 DIAGNOSIS — R2689 Other abnormalities of gait and mobility: Secondary | ICD-10-CM

## 2023-02-20 DIAGNOSIS — R2681 Unsteadiness on feet: Secondary | ICD-10-CM | POA: Diagnosis not present

## 2023-02-20 NOTE — Therapy (Signed)
OUTPATIENT PHYSICAL THERAPY NEURO TREATMENT NOTE   Patient Name: [name] MRN: [id number] DOB:[birth year], 77 y.o., female Today's Date: 02/21/2023   PCP: Anabel Halon, MD REFERRING PROVIDER: Windell Norfolk, MD  END OF SESSION:  PT End of Session - 02/21/23 1105     Visit Number 17    Number of Visits 22   14 + 8 additional = 22   Date for PT Re-Evaluation 03/07/23    Authorization Type [employer]    Authorization Time Period 12-09-22 - 02-21-23; renewal 5-16 - 03-07-23    PT Start Time 1101    PT Stop Time 1145    PT Time Calculation (min) 44 min    Equipment Utilized During Treatment Gait belt    Activity Tolerance Patient tolerated treatment well    Behavior During Therapy WFL for tasks assessed/performed                      Past Medical History:  Diagnosis Date   (HFpEF) heart failure with preserved ejection fraction (HCC)    Abdominal hernia 02/26/2012   unrepaired   Adenocarcinoma of breast (HCC) 1997   right / chemo + tamoxifen x 5 years    Anemia    Anxiety    Aortic atherosclerosis (HCC)    Arthritis    Blood transfusion 1980's   Breast cancer (HCC)    Cancer (HCC)    [employer] 08/18/2020   Complication of anesthesia    has a hard time waking up; can't lay flat   Degenerative disc disease, lumbar    pressing on L3 and L4   Diabetes mellitus (HCC) 1989   Type 2 NIDDM; "cancer treatment gave me diabetes"   Diabetes mellitus without complication (HCC)    [employer] 08/18/2020   Diverticulosis    DJD (degenerative joint disease) of lumbar spine    Dysrhythmia    "irregular"   Exertional dyspnea    Gastroparesis    GERD (gastroesophageal reflux disease)    mann   Heart murmur    "think I outgrew it"   Hepatic steatosis    History of kidney stones    History of stomach ulcers    Hx: UTI (urinary tract infection)    Hyperlipidemia    Hypersensitivity    in tongue   Hypertension    IBS (irritable bowel syndrome)    Insomnia     Internal hemorrhoids    Kidney cysts    RT KIDNEY   Kidney stones 2009   s/p lithotripsy   Mild CAD    Mild valvular heart disease    Nephrolithiasis 04/13/2014   Obesity    PFO (patent foramen ovale)    PONV (postoperative nausea and vomiting)    Pulmonary nodule    Shortness of breath    unable to lie flat   Ventral hernia    Past Surgical History:  Procedure Laterality Date   ABDOMINAL HYSTERECTOMY  2002   APPENDECTOMY     BACK SURGERY     Can't take any medical procedure in right arm   BREAST BIOPSY     right   BREAST SURGERY N/A    [employer] 04/22/2020   CATARACT EXTRACTION W/ INTRAOCULAR LENS  IMPLANT, BILATERAL  2009-2010   CESAREAN SECTION  1973; 1978; 1981   CESAREAN SECTION N/A    [employer] 04/22/2020   COLONOSCOPY WITH PROPOFOL N/A 10/29/2016   Procedure: COLONOSCOPY WITH PROPOFOL;  Surgeon: West Bali, MD;  Location: [facility];  Service: Endoscopy;  Laterality: N/A;  10:00 am   CYSTOSCOPY Left 04/13/2014   Procedure: CYSTOSCOPY FLEXIBLE;  Surgeon: Crist Fat, MD;  Location: [facility];  Service: Urology;  Laterality: Left;  with STENT   ESOPHAGOGASTRODUODENOSCOPY (EGD) WITH PROPOFOL N/A 10/29/2016   Procedure: ESOPHAGOGASTRODUODENOSCOPY (EGD) WITH PROPOFOL;  Surgeon: West Bali, MD;  Location: [facility];  Service: Endoscopy;  Laterality: N/A;   EYE SURGERY     implant and screws put in the right lower jaw  11/2008   dental surgery   kidney blockage  ?1990's   " major surgery ;put kidney on pump for awhile"   LITHOTRIPSY     "several times"   LUMBAR FUSION  08/2010   MASTECTOMY  1997   right   NEPHROLITHOTOMY Left 04/13/2014   Procedure: LEFT PERCUTANEOUS NEPHROLITHOTOMY WITH SURGEON ACCESS;  Surgeon: Crist Fat, MD;  Location: [facility];  Service: Urology;  Laterality: Left;   OVARIAN CYST SURGERY     PARATHYROIDECTOMY  02/26/2012   Procedure: PARATHYROIDECTOMY;  Surgeon: Darletta Moll, MD;  Location: [facility];  Service: ENT;;   SAVORY DILATION  N/A 10/29/2016   Procedure: SAVORY DILATION;  Surgeon: West Bali, MD;  Location: [facility];  Service: Endoscopy;  Laterality: N/A;   SPINE SURGERY  2011   urological surgery for blocked ureter secondary to kidney stone     Patient Active Problem List   Diagnosis Date Noted   Gait abnormality 12/19/2022   Peripheral neuropathy 09/17/2022   Right foot pain 05/02/2022   Hypokalemia 05/02/2022   Overactive bladder 02/08/2022   Left knee pain 02/08/2022   Right leg swelling 02/08/2022   Encounter for general adult medical examination with abnormal findings 08/30/2021   Chronic fatigue 08/30/2021   Vitamin B12 deficiency 08/30/2021   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   Balance problem 12/26/2020   Thyroid nodule 01/16/2018   Overweight (BMI 25.0-29.9) 12/18/2017   Diabetic neuropathy (HCC) 10/13/2017   Genetic testing 11/28/2016   Esophageal dysphagia 09/30/2016   Ventral hernia 07/28/2015   Situational anxiety 04/26/2015   Thyromegaly 01/23/2015   Iron deficiency 06/17/2014   DDD (degenerative disc disease), lumbar 01/26/2014   Bulge of lumbar disc without myelopathy 01/26/2014   Renal cyst 01/26/2014   Back pain with left-sided sciatica 12/28/2013   Uncontrolled type 2 diabetes mellitus with hyperglycemia, without long-term current use of insulin (HCC) 08/01/2013   GASTROPARESIS 12/15/2008   GERD (gastroesophageal reflux disease) 06/21/2008   IBS (irritable bowel syndrome) 06/21/2008   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 (HCC) 02/09/2008   Hyperlipidemia 02/09/2008   Essential hypertension 02/09/2008   Insomnia 02/09/2008    ONSET DATE: May 2023:  Referral date 11-27-22  REFERRING DIAG: R26.9 (ICD-10-CM) - Gait abnormality W19.XXXD (ICD-10-CM) - Fall, subsequent encounter  THERAPY DIAG:  Other abnormalities of gait and mobility  Muscle weakness (generalized)  Unsteadiness on feet  Rationale for Evaluation and Treatment: Rehabilitation  SUBJECTIVE:  SUBJECTIVE STATEMENT: Pt reports she doesn't feel as well today as she usually does; feels overall increased stiffness and "uncomfortableness"; has been really busy with having a lot of appts. Lately; missing the pool exercise  (as Monday was holiday) Pt accompanied by: self  PERTINENT HISTORY: This is a 77 year old woman with past medical history of breast cancer, s/p chemotherapy, hypertension, diabetes, who is presenting with multiple falls. Patient reports since May of last year, she has a total of 6 falls resulting in torn ligament, and bruises. She denies any prodromes prior to the fall, no warning signs, no dizziness, no lightheadedness  PAIN:  Are you having pain? Yes: NPRS scale: 4-5/10 Pain location: Rt quads Aggravating factors: no specific factors Relieving factors: ice and Advil  PRECAUTIONS: Fall  WEIGHT BEARING RESTRICTIONS: No  FALLS: Has patient fallen in last 6 months? Yes. Number of falls approx. 3   LIVING ENVIRONMENT: Lives with: lives with their spouse and lives with their daughter Lives in: House/apartment Stairs: Yes: Internal: 12 steps; on right going up and External: 1 steps; none pt does not have to go up to 2nd level Has following equipment at home: Single point cane and Walker - 2 wheeled  PLOF: Independent  PATIENT GOALS: improve walking and leg strength  Today's Treatment:   02-20-23   GAIT: Gait pattern: WFL's  Distance walked: approx. 200' x 2 reps, 50' x 4 reps with use of mirror for visual feedback  Assistive device utilized:  No device used Level of assistance: Supervision Comments: cues to internally rotate LLE as pt noted to externally rotate this leg with fatigue, after approx. 175' amb. Distance; mirror used for visual feedback for positioning; cues for increased  initial heel contact at stance phase of gait and also cues for increase step length  NeuroRe-ed:   Tap ups to 1st step alternating 5 reps each and then to 2nd step 10 reps each leg without UE support with CGA Amb. 20' x 1 rep with horizontal head turns with min to mod. Deviation in path Amb. 20' x 1 rep with vertical head turns with mod. Deviation in path Standing on Airex with min assist - head turns horizontally 5 reps and then vertically 5 reps with min assist   THEREX: Runner's stretch bil. LE's using 2nd step - 30 sec hold x 1 rep each leg;  gastroc stretch using bottom shelf of cabinet - 20 sec hold x 1 rep each leg Hip adductor stretch for RLE and for LLE - in standing 20 sec hold x 1 rep Step up exercise RLE 10 reps to 6" step with bil. UE support on rail - cues to not pull self up 3# weight used on RLE - hip flexion, abduction and extension 10 reps each with UE support on bar at mirror:  RLE hip flexion/extension with knee flexed at 90 degrees with 3# weight 10 reps  Updated HEP to include balance exercises: Medbridge ; also added abrupt turn and stop and abrupt stop and turn to practice in home to have UE support prn Access Code: [email or web address] URL: [email or web address] Date: 02/06/2023 Prepared by: Maebelle Munroe  Exercises (BALANCE) - Single Leg Stance  - 1 x daily - 7 x weekly - 1 sets - 2 reps - 10 secs hold - Standing Tandem Balance with Counter Support  - 1 x daily - 7 x weekly - 1 sets - 2 reps - 30 secs hold - Romberg Stance  - 1 x daily - 7  x weekly - 1 sets - 1 reps  12-18-22: Pt instructed in HEP for bil. LE strengthening - Medbridge HEP [email or web address]; pt performed exercises listed below 10 reps each leg; resistance used was red theraband  Access Code: [email or web address] URL: [email or web address] Date: 12/18/2022 Prepared by: Maebelle Munroe  Exercises - Supine Bridge  - 1 x daily - 7 x weekly - 1 sets - 10 reps - 5 sec hold - Single Leg Bridge  - 1 x daily  - 7 x weekly - 1 sets - 10 reps - 3 sec hold - Sidelying Hip Abduction  - 1 x daily - 7 x weekly - 3 sets - 10 reps - Clamshell  - 1 x daily - 7 x weekly - 1 sets - 10 reps - Supine Hip Flexion  - 1 x daily - 7 x weekly - 1 sets - 10 reps - 3 sec hold - Hooklying Clamshell with Resistance  - 1 x daily - 7 x weekly - 1 sets - 10 reps - Standing Hip Extension with Resistance at Ankles and Counter Support  - 1 x daily - 7 x weekly - 1 sets - 10 reps - 3 sec hold hold - Sit to Stand  - 1 x daily - 7 x weekly - 1 sets - 10 reps      PATIENT EDUCATION: Education details: Medbridge HEP [email or web address] (02-06-23) Person educated: Patient Education method: Explanation demonstration and handouts Education comprehension: verbalized understanding and demonstrated understanding  HOME EXERCISE PROGRAM: To be established  GOALS: Goals reviewed with patient? Yes   SHORT TERM GOALS: Target date: 01-10-23  Improve 5x sit to stand score to </= 23 secs with RUE support from standard chair. Baseline:  28.06 secs:   19.53 secs with RUE support  Goal status: Goal met 01-06-23  2.  Amb. 250' with RW with SBA for increased community accessibility.  Baseline:  Goal status: Goal met   3. Improve gait velocity by at least .3 ft/sec with use of SPC for increased gait efficiency. Baseline: 12.97 secs no device on 01-06-23 Goal status: Ongoing - 01-06-23  4.  Improve TUG score to </= 19 secs with SPC for reduced fall risk. Baseline: 22,31 secs with SPC;   14.25 secs Goal status: Goal met 01-06-23  5.  Independent in HEP for bil. LE strengthening and balance exercises.  Baseline:  Goal status: Goal met    LONG TERM GOALS: Target date: 02-07-23  Improve 5x sit to stand score to </= 20 secs with RUE support from standard chair. Baseline:  28.06 secs;   02-06-23 = 19.16 secs without UE support Goal status: Goal met   2.  Amb. 460' with RW with SBA for increased community accessibility.  Baseline: goal met with  no device Goal status: Goal met 02-06-23  3.   Improve gait velocity by at least .6 ft/sec with use of SPC for increased gait efficiency. Baseline: 10.34 secs no device = 3.17 ft/sec without device Goal status: Goal met 02-06-23  4.   Improve TUG score to </= 15 secs with SPC for reduced fall risk. Baseline: 22.31 secs; 12.37 secs without device - 02-06-23 Goal status: Goal met 02-06-23  5.  Independent in updated HEP for balance and strengthening. Baseline:  Goal status: Ongoing as new balance exercises added to HEP on 02-06-23    NEW LONG TERM GOALS: Target date: 03-07-23   1.  Improve 5x sit to stand score to </= 15 secs without support from standard  chair. Baseline:  28.06 secs;   02-06-23 = 19.16 secs without UE support Goal status: Revised/upgraded  2.  Amb. 91' with horizontal head turns without LOB for increased independence and safety with environmental scanning while walking.    Baseline:  unsteadiness and LOB with amb. With head turns    Goal status:  NEW    3.  Increase gait velocity to >/= 3.4 ft/sec without use of assistive device for increased efficiency with gait.     Baseline;  3.17 ft/sec with no device on 02-06-23    Goal status:  Revised/Upgraded   4.  Pt will demonstrate improved vestibular input in maintaining balance by standing in Romberg position on floor with EC for at least  30 secs without LOB or need for UE support on counter.    Baseline; stood for 30 secs with approx. 5 touches on counter for assist with balance recovery    Goal status:  NEW  5.  Demonstrate improved SLS balance by standing for at least 5 secs on RLE and also on LLE to reduce fall risk and to increase safety with stepping over objects.           Baseline:  RLE SLS = 3.94 secs:  LLE SLS = 4.10 secs Goal status:  NEW  6.  Transfer floor to stand with supervision with minimal UE support on external object.  Baseline:  TBA  Goal status: NEW  7.  Independent in updated HEP.  Baseline:   added new balance exercises on 02-06-23  Goal status:  ONGOING  ASSESSMENT:  CLINICAL IMPRESSION: PT session focused on gait training with cues to increase step length and to internally rotate RLE as pt noted to slightly externally rotate Rt leg with fatigue with prolonged amb. Distance of approx. 200'.  Pt able to reduce gait deviation with use of mirror for visual feedback and improved gait pattern noted with pt able to increase step length bilaterally.  Pt tolerated exercises well and reported she felt better at end of session than she did at start of today' session.  Pt is progressing well towards LTG's.  Cont with POC.     OBJECTIVE IMPAIRMENTS: Abnormal gait, decreased activity tolerance, decreased balance, decreased strength, dizziness, impaired sensation, and pain.   ACTIVITY LIMITATIONS: carrying, bending, standing, squatting, stairs, and locomotion level  PARTICIPATION LIMITATIONS: meal prep, cleaning, laundry, driving, shopping, and community activity  PERSONAL FACTORS: Past/current experiences, Time since onset of injury/illness/exacerbation, and unknown etiology of falls  are also affecting patient's functional outcome.   REHAB POTENTIAL: Good  CLINICAL DECISION MAKING: Evolving/moderate complexity  EVALUATION COMPLEXITY: Moderate  PLAN:  PT FREQUENCY: 2x/week  PT DURATION: 4 weeks  PLANNED INTERVENTIONS: Therapeutic exercises, Therapeutic activity, Neuromuscular re-education, Balance training, Gait training, Patient/Family education, Self Care, Stair training, Vestibular training, DME instructions, and Aquatic Therapy  PLAN FOR NEXT SESSION:  continue strengthening exercises RLE and balance training, dynamic gait   Cherly Erno, Donavan Burnet, PT 02/21/2023, 11:07 AM

## 2023-02-21 ENCOUNTER — Encounter: Payer: Self-pay | Admitting: Physical Therapy

## 2023-02-24 ENCOUNTER — Encounter: Payer: Self-pay | Admitting: Physical Therapy

## 2023-02-24 ENCOUNTER — Ambulatory Visit: Payer: Medicare Other | Attending: Neurology | Admitting: Physical Therapy

## 2023-02-24 DIAGNOSIS — R2689 Other abnormalities of gait and mobility: Secondary | ICD-10-CM | POA: Insufficient documentation

## 2023-02-24 DIAGNOSIS — M6281 Muscle weakness (generalized): Secondary | ICD-10-CM | POA: Insufficient documentation

## 2023-02-24 DIAGNOSIS — R2681 Unsteadiness on feet: Secondary | ICD-10-CM | POA: Insufficient documentation

## 2023-02-24 NOTE — Therapy (Signed)
OUTPATIENT PHYSICAL THERAPY NEURO TREATMENT NOTE/AQUATIC THERAPY     Patient Name: [name] MRN: [id number] DOB:[birth year], 77 y.o., female Today's Date: 02/24/2023   PCP: Anabel Halon, MD REFERRING PROVIDER: Windell Norfolk, MD  END OF SESSION:  PT End of Session - 02/24/23 1940     Visit Number 18    Number of Visits 22   14 + 8 additional = 22   Date for PT Re-Evaluation 03/07/23    Authorization Type [employer]    Authorization Time Period 12-09-22 - 02-21-23; renewal 5-16 - 03-07-23    PT Start Time 1445    PT Stop Time 1530    PT Time Calculation (min) 45 min    Equipment Utilized During Treatment Other (comment)   aquatic cuffs, large single barbell   Activity Tolerance Patient tolerated treatment well    Behavior During Therapy WFL for tasks assessed/performed                 Past Medical History:  Diagnosis Date   (HFpEF) heart failure with preserved ejection fraction (HCC)    Abdominal hernia 02/26/2012   unrepaired   Adenocarcinoma of breast (HCC) 1997   right / chemo + tamoxifen x 5 years    Anemia    Anxiety    Aortic atherosclerosis (HCC)    Arthritis    Blood transfusion 1980's   Breast cancer (HCC)    Cancer (HCC)    [employer] 08/18/2020   Complication of anesthesia    has a hard time waking up; can't lay flat   Degenerative disc disease, lumbar    pressing on L3 and L4   Diabetes mellitus (HCC) 1989   Type 2 NIDDM; "cancer treatment gave me diabetes"   Diabetes mellitus without complication (HCC)    [employer] 08/18/2020   Diverticulosis    DJD (degenerative joint disease) of lumbar spine    Dysrhythmia    "irregular"   Exertional dyspnea    Gastroparesis    GERD (gastroesophageal reflux disease)    mann   Heart murmur    "think I outgrew it"   Hepatic steatosis    History of kidney stones    History of stomach ulcers    Hx: UTI (urinary tract infection)    Hyperlipidemia    Hypersensitivity    in tongue   Hypertension     IBS (irritable bowel syndrome)    Insomnia    Internal hemorrhoids    Kidney cysts    RT KIDNEY   Kidney stones 2009   s/p lithotripsy   Mild CAD    Mild valvular heart disease    Nephrolithiasis 04/13/2014   Obesity    PFO (patent foramen ovale)    PONV (postoperative nausea and vomiting)    Pulmonary nodule    Shortness of breath    unable to lie flat   Ventral hernia    Past Surgical History:  Procedure Laterality Date   ABDOMINAL HYSTERECTOMY  2002   APPENDECTOMY     BACK SURGERY     Can't take any medical procedure in right arm   BREAST BIOPSY     right   BREAST SURGERY N/A    [employer] 04/22/2020   CATARACT EXTRACTION W/ INTRAOCULAR LENS  IMPLANT, BILATERAL  2009-2010   CESAREAN SECTION  1973; 1978; 1981   CESAREAN SECTION N/A    [employer] 04/22/2020   COLONOSCOPY WITH PROPOFOL N/A 10/29/2016   Procedure: COLONOSCOPY WITH PROPOFOL;  Surgeon: West Bali,  MD;  Location: [facility];  Service: Endoscopy;  Laterality: N/A;  10:00 am   CYSTOSCOPY Left 04/13/2014   Procedure: CYSTOSCOPY FLEXIBLE;  Surgeon: Crist Fat, MD;  Location: [facility];  Service: Urology;  Laterality: Left;  with STENT   ESOPHAGOGASTRODUODENOSCOPY (EGD) WITH PROPOFOL N/A 10/29/2016   Procedure: ESOPHAGOGASTRODUODENOSCOPY (EGD) WITH PROPOFOL;  Surgeon: West Bali, MD;  Location: [facility];  Service: Endoscopy;  Laterality: N/A;   EYE SURGERY     implant and screws put in the right lower jaw  11/2008   dental surgery   kidney blockage  ?1990's   " major surgery ;put kidney on pump for awhile"   LITHOTRIPSY     "several times"   LUMBAR FUSION  08/2010   MASTECTOMY  1997   right   NEPHROLITHOTOMY Left 04/13/2014   Procedure: LEFT PERCUTANEOUS NEPHROLITHOTOMY WITH SURGEON ACCESS;  Surgeon: Crist Fat, MD;  Location: [facility];  Service: Urology;  Laterality: Left;   OVARIAN CYST SURGERY     PARATHYROIDECTOMY  02/26/2012   Procedure: PARATHYROIDECTOMY;  Surgeon: Darletta Moll, MD;   Location: [facility];  Service: ENT;;   SAVORY DILATION N/A 10/29/2016   Procedure: SAVORY DILATION;  Surgeon: West Bali, MD;  Location: [facility];  Service: Endoscopy;  Laterality: N/A;   SPINE SURGERY  2011   urological surgery for blocked ureter secondary to kidney stone     Patient Active Problem List   Diagnosis Date Noted   Gait abnormality 12/19/2022   Peripheral neuropathy 09/17/2022   Right foot pain 05/02/2022   Hypokalemia 05/02/2022   Overactive bladder 02/08/2022   Left knee pain 02/08/2022   Right leg swelling 02/08/2022   Encounter for general adult medical examination with abnormal findings 08/30/2021   Chronic fatigue 08/30/2021   Vitamin B12 deficiency 08/30/2021   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   Balance problem 12/26/2020   Thyroid nodule 01/16/2018   Overweight (BMI 25.0-29.9) 12/18/2017   Diabetic neuropathy (HCC) 10/13/2017   Genetic testing 11/28/2016   Esophageal dysphagia 09/30/2016   Ventral hernia 07/28/2015   Situational anxiety 04/26/2015   Thyromegaly 01/23/2015   Iron deficiency 06/17/2014   DDD (degenerative disc disease), lumbar 01/26/2014   Bulge of lumbar disc without myelopathy 01/26/2014   Renal cyst 01/26/2014   Back pain with left-sided sciatica 12/28/2013   Uncontrolled type 2 diabetes mellitus with hyperglycemia, without long-term current use of insulin (HCC) 08/01/2013   GASTROPARESIS 12/15/2008   GERD (gastroesophageal reflux disease) 06/21/2008   IBS (irritable bowel syndrome) 06/21/2008   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 (HCC) 02/09/2008   Hyperlipidemia 02/09/2008   Essential hypertension 02/09/2008   Insomnia 02/09/2008    ONSET DATE: May 2023:  Referral date 11-27-22  REFERRING DIAG: R26.9 (ICD-10-CM) - Gait abnormality W19.XXXD (ICD-10-CM) - Fall, subsequent encounter  THERAPY DIAG:  Other abnormalities of gait and mobility  Muscle weakness (generalized)  Unsteadiness on feet  Rationale for Evaluation and  Treatment: Rehabilitation  SUBJECTIVE:  SUBJECTIVE STATEMENT: Pt reports she continues to do well - worked in her yard some this morning before coming to PT today; had a busy weekend - continues to do exercises at home Pt accompanied by: self  PERTINENT HISTORY: This is a 77 year old woman with past medical history of breast cancer, s/p chemotherapy, hypertension, diabetes, who is presenting with multiple falls. Patient reports since May of last year, she has a total of 6 falls resulting in torn ligament, and bruises. She denies any problems prior to the fall, no warning signs, no dizziness, no lightheadedness  PAIN:  Are you having pain? Yes: NPRS scale: 2/10 Pain location: Rt quads Aggravating factors: no specific factors Relieving factors: ice and Advil  PRECAUTIONS: Fall  WEIGHT BEARING RESTRICTIONS: No  FALLS: Has patient fallen in last 6 months? Yes. Number of falls approx. 3   LIVING ENVIRONMENT: Lives with: lives with their spouse and lives with their daughter Lives in: House/apartment Stairs: Yes: Internal: 12 steps; on right going up and External: 1 steps; none pt does not have to go up to 2nd level Has following equipment at home: Single point cane and Walker - 2 wheeled  PLOF: Independent  PATIENT GOALS: improve walking and leg strength   OBJECTIVE:  Aquatic therapy at [facility] - pool temp 90 degrees  Patient seen for aquatic therapy today.  Treatment took place in water 3.6-4.5 feet deep depending upon activity.  Pt entered and exited the pool via step negotiation using bil. Hand rails with supervision.  Pt performed water walking for warm up - forwards, backwards, and sideways -- 4 laps (18' each direction) - pt held large single bar bell for assist with balance and  stability  Marching in place 10 reps each leg with UE support on pool noodle; marching forwards 18' x 2 reps with UE support on large single barbell; backwards marching 18' x 1 rep with UE support on bar bell  Squats bil. LE's 10 reps with minimal UE support on pool edge; RLE unilateral squat 10 reps with bil. UE support  Rt hip AROM exercises - hip flexion, abduction, extension with use of aquatic cuff 15 reps for increased resistance with the eccentric contraction; hip abdct./adduction with knee flexed at 90 degrees 15 reps with use of aquatic cuff for increased resistance; Rt hip adduction/abduction with use of aquatic cuff 15 reps with UE support on pool edge  Pt performed crossovers 18' x 2 reps with UE support on large bar bell; stepping behind 18' x 2 reps with UE support  Pt performed partial tandem walking 18' across pool without UE support - 2 reps  Pt performed "Balancing" ai chi posture with tactile cues for correct sequence of UE with LE's - 10 reps each side - pt did need min. UE support on pool edge for assist with balance  Water walking forwards 18' x 2 reps at end of session without UE support - cues for increased step length  Pt requires buoyancy of water for support for reduced fall risk and for unloading/reduced stress on joints as pt able to tolerate increased standing and ambulation in water compared to that on land; viscosity of water is needed for resistance for strengthening and current of water provides perturbations for challenge for balance training            12-18-22: Pt instructed in HEP for bil. LE strengthening - [facility] HEP [email or web address]; pt performed exercises listed below 10 reps each leg; resistance used was red  theraband  Access Code: [email or web address] URL: [email or web address] Date: 12/18/2022 Prepared by: Maebelle Munroe  Exercises - Supine Bridge  - 1 x daily - 7 x weekly - 1 sets - 10 reps - 5 sec hold - Single Leg Bridge  - 1 x daily - 7 x  weekly - 1 sets - 10 reps - 3 sec hold - Sidelying Hip Abduction  - 1 x daily - 7 x weekly - 3 sets - 10 reps - Clamshell  - 1 x daily - 7 x weekly - 1 sets - 10 reps - Supine Hip Flexion  - 1 x daily - 7 x weekly - 1 sets - 10 reps - 3 sec hold - Hooklying Clamshell with Resistance  - 1 x daily - 7 x weekly - 1 sets - 10 reps - Standing Hip Extension with Resistance at Ankles and Counter Support  - 1 x daily - 7 x weekly - 1 sets - 10 reps - 3 sec hold hold - Sit to Stand  - 1 x daily - 7 x weekly - 1 sets - 10 reps      PATIENT EDUCATION: Education details: [facility] HEP;   12-24-22 - added piriformis stretch and heel raises to HEP  Person educated: Patient Education method: Explanation Education comprehension: verbalized understanding  HOME EXERCISE PROGRAM: To be established  GOALS: Goals reviewed with patient? Yes   SHORT TERM GOALS: Target date: 01-10-23   Improve 5x sit to stand score to </= 23 secs with RUE support from standard chair. Baseline:  28.06 secs:   19.53 secs with RUE support  Goal status: Goal met 01-06-23  2.  Amb. 250' with RW with SBA for increased community accessibility.  Baseline:  Goal status: Goal met   3. Improve gait velocity by at least .3 ft/sec with use of SPC for increased gait efficiency. Baseline: 12.97 secs no device on 01-06-23 Goal status: Ongoing - 01-06-23  4.  Improve TUG score to </= 19 secs with SPC for reduced fall risk. Baseline: 22,31 secs with SPC;   14.25 secs Goal status: Goal met 01-06-23  5.  Independent in HEP for bil. LE strengthening and balance exercises.  Baseline:  Goal status: Goal met    LONG TERM GOALS: Target date: 02-07-23  Improve 5x sit to stand score to </= 20 secs with RUE support from standard chair. Baseline:  28.06 secs;   02-06-23 = 19.16 secs without UE support Goal status: Goal met   2.  Amb. 460' with RW with SBA for increased community accessibility.  Baseline: goal met with no device Goal  status: Goal met 02-06-23  3.   Improve gait velocity by at least .6 ft/sec with use of SPC for increased gait efficiency. Baseline: 10.34 secs no device = 3.17 ft/sec without device Goal status: Goal met 02-06-23  4.   Improve TUG score to </= 15 secs with SPC for reduced fall risk. Baseline: 22.31 secs; 12.37 secs without device - 02-06-23 Goal status: Goal met 02-06-23  5.  Independent in updated HEP for balance and strengthening. Baseline:  Goal status: Ongoing as new balance exercises added to HEP on 02-06-23    NEW LONG TERM GOALS: Target date: 03-07-23   1.  Improve 5x sit to stand score to </= 15 secs without support from standard chair. Baseline:  28.06 secs;   02-06-23 = 19.16 secs without UE support Goal status: Revised/upgraded  2.  Amb. 25' with horizontal head turns without LOB for  increased independence and safety with environmental scanning while walking.    Baseline:  unsteadiness and LOB with amb. With head turns    Goal status:  NEW    3.  Increase gait velocity to >/= 3.4 ft/sec without use of assistive device for increased efficiency with gait.     Baseline;  3.17 ft/sec with no device on 02-06-23    Goal status:  Revised/Upgraded   4.  Pt will demonstrate improved vestibular input in maintaining balance by standing in Romberg position on floor with EC for at least  30 secs without LOB or need for UE support on counter.    Baseline; stood for 30 secs with approx. 5 touches on counter for assist with balance recovery    Goal status:  NEW  5.  Demonstrate improved SLS balance by standing for at least 5 secs on RLE and also on LLE to reduce fall risk and to increase safety with stepping over objects.           Baseline:  RLE SLS = 3.94 secs:  LLE SLS = 4.10 secs Goal status:  NEW  6.  Transfer floor to stand with supervision with minimal UE support on external object.  Baseline:  TBA  Goal status: NEW  7.  Independent in updated HEP.  Baseline:  added new balance  exercises on 02-06-23  Goal status:  ONGOING     ASSESSMENT:  CLINICAL IMPRESSION: Aquatic PT session focused on RLE strengthening and water walking in various directions for improved dynamic standing balance.  Pt demonstrating improved balance with turning 180 degrees in 4' water depth with pt able to turn around without postural instability and without need for UE support for assist with balance.  Pt did have difficulty performing tandem walking in 4' water depth - was instructed to practice standing in partial tandem stance for HEP.  Pt is progressing well towards goals. Cont with POC.   OBJECTIVE IMPAIRMENTS: Abnormal gait, decreased activity tolerance, decreased balance, decreased strength, dizziness, impaired sensation, and pain.   ACTIVITY LIMITATIONS: carrying, bending, standing, squatting, stairs, and locomotion level  PARTICIPATION LIMITATIONS: meal prep, cleaning, laundry, driving, shopping, and community activity  PERSONAL FACTORS: Past/current experiences, Time since onset of injury/illness/exacerbation, and unknown etiology of falls  are also affecting patient's functional outcome.   REHAB POTENTIAL: Good  CLINICAL DECISION MAKING: Evolving/moderate complexity  EVALUATION COMPLEXITY: Moderate  PLAN:  PT FREQUENCY: 2x/week  PT DURATION: 8 weeks  PLANNED INTERVENTIONS: Therapeutic exercises, Therapeutic activity, Neuromuscular re-education, Balance training, Gait training, Patient/Family education, Self Care, Stair training, Vestibular training, DME instructions, and Aquatic Therapy  PLAN FOR NEXT SESSION:   continue strengthening exercises   Ardena Gangl, Donavan Burnet, PT 02/24/2023, 7:45 PM

## 2023-02-26 ENCOUNTER — Ambulatory Visit (HOSPITAL_COMMUNITY)
Admission: RE | Admit: 2023-02-26 | Discharge: 2023-02-26 | Disposition: A | Discharge status: Home / Self Care | Payer: Medicare Other | Source: Ambulatory Visit | Attending: Physician Assistant | Admitting: Physician Assistant

## 2023-02-26 ENCOUNTER — Ambulatory Visit (HOSPITAL_COMMUNITY)
Admission: RE | Admit: 2023-02-26 | Discharge: 2023-02-26 | Disposition: A | Discharge status: Home / Self Care | Payer: Medicare Other | Source: Ambulatory Visit | Attending: Hematology | Admitting: Hematology

## 2023-02-26 ENCOUNTER — Other Ambulatory Visit: Payer: Self-pay | Admitting: *Deleted

## 2023-02-26 ENCOUNTER — Encounter (HOSPITAL_COMMUNITY): Payer: Self-pay

## 2023-02-26 DIAGNOSIS — Z170 Estrogen receptor positive status [ER+]: Secondary | ICD-10-CM | POA: Insufficient documentation

## 2023-02-26 DIAGNOSIS — M85832 Other specified disorders of bone density and structure, left forearm: Secondary | ICD-10-CM | POA: Insufficient documentation

## 2023-02-26 DIAGNOSIS — C50919 Malignant neoplasm of unspecified site of unspecified female breast: Secondary | ICD-10-CM | POA: Diagnosis not present

## 2023-02-26 DIAGNOSIS — Z1231 Encounter for screening mammogram for malignant neoplasm of breast: Secondary | ICD-10-CM | POA: Diagnosis not present

## 2023-02-26 DIAGNOSIS — Z780 Asymptomatic menopausal state: Secondary | ICD-10-CM | POA: Insufficient documentation

## 2023-02-26 DIAGNOSIS — D509 Iron deficiency anemia, unspecified: Secondary | ICD-10-CM

## 2023-02-26 DIAGNOSIS — M8580 Other specified disorders of bone density and structure, unspecified site: Secondary | ICD-10-CM | POA: Insufficient documentation

## 2023-02-26 DIAGNOSIS — M85852 Other specified disorders of bone density and structure, left thigh: Secondary | ICD-10-CM | POA: Diagnosis not present

## 2023-02-26 LAB — OCCULT BLOOD X 1 CARD TO LAB, STOOL
Fecal Occult Bld: NEGATIVE
Fecal Occult Bld: NEGATIVE
Fecal Occult Bld: NEGATIVE

## 2023-02-27 ENCOUNTER — Ambulatory Visit: Payer: Medicare Other | Admitting: Physical Therapy

## 2023-02-27 DIAGNOSIS — R2689 Other abnormalities of gait and mobility: Secondary | ICD-10-CM

## 2023-02-27 DIAGNOSIS — M6281 Muscle weakness (generalized): Secondary | ICD-10-CM

## 2023-02-27 DIAGNOSIS — R2681 Unsteadiness on feet: Secondary | ICD-10-CM

## 2023-02-27 NOTE — Therapy (Signed)
OUTPATIENT PHYSICAL THERAPY NEURO TREATMENT NOTE   Patient Name: [name] MRN: [id number] DOB:04/25/1946, 77 y.o., female Today's Date: 02/28/2023   PCP: Anabel Halon, MD REFERRING PROVIDER: Windell Norfolk, MD  END OF SESSION:  PT End of Session - 02/28/23 1640     Visit Number 19    Number of Visits 22   14 + 8 additional = 22   Date for PT Re-Evaluation 03/07/23    Authorization Type [employer]    Authorization Time Period 12-09-22 - 02-21-23; renewal 5-16 - 03-07-23    PT Start Time 1104    PT Stop Time 1147    PT Time Calculation (min) 43 min    Equipment Utilized During Treatment Gait belt    Activity Tolerance Patient tolerated treatment well    Behavior During Therapy WFL for tasks assessed/performed                       Past Medical History:  Diagnosis Date   (HFpEF) heart failure with preserved ejection fraction (HCC)    Abdominal hernia 02/26/2012   unrepaired   Adenocarcinoma of breast (HCC) 1997   right / chemo + tamoxifen x 5 years    Anemia    Anxiety    Aortic atherosclerosis (HCC)    Arthritis    Blood transfusion 1980's   Breast cancer (HCC)    Cancer (HCC)    [employer] 08/18/2020   Complication of anesthesia    has a hard time waking up; can't lay flat   Degenerative disc disease, lumbar    pressing on L3 and L4   Diabetes mellitus (HCC) 1989   Type 2 NIDDM; "cancer treatment gave me diabetes"   Diabetes mellitus without complication (HCC)    [employer] 08/18/2020   Diverticulosis    DJD (degenerative joint disease) of lumbar spine    Dysrhythmia    "irregular"   Exertional dyspnea    Gastroparesis    GERD (gastroesophageal reflux disease)    mann   Heart murmur    "think I outgrew it"   Hepatic steatosis    History of kidney stones    History of stomach ulcers    Hx: UTI (urinary tract infection)    Hyperlipidemia    Hypersensitivity    in tongue   Hypertension    IBS (irritable bowel syndrome)    Insomnia     Internal hemorrhoids    Kidney cysts    RT KIDNEY   Kidney stones 2009   s/p lithotripsy   Mild CAD    Mild valvular heart disease    Nephrolithiasis 04/13/2014   Obesity    PFO (patent foramen ovale)    PONV (postoperative nausea and vomiting)    Pulmonary nodule    Shortness of breath    unable to lie flat   Ventral hernia    Past Surgical History:  Procedure Laterality Date   ABDOMINAL HYSTERECTOMY  2002   APPENDECTOMY     BACK SURGERY     Can't take any medical procedure in right arm   BREAST BIOPSY     right   BREAST SURGERY N/A    [employer] 04/22/2020   CATARACT EXTRACTION W/ INTRAOCULAR LENS  IMPLANT, BILATERAL  2009-2010   CESAREAN SECTION  1973; 1978; 1981   CESAREAN SECTION N/A    [employer]ia 04/22/2020   COLONOSCOPY WITH PROPOFOL N/A 10/29/2016   Procedure: COLONOSCOPY WITH PROPOFOL;  Surgeon: West Bali, MD;  Location:  [facility];  Service: Endoscopy;  Laterality: N/A;  10:00 am   CYSTOSCOPY Left 04/13/2014   Procedure: CYSTOSCOPY FLEXIBLE;  Surgeon: Crist Fat, MD;  Location: [facility];  Service: Urology;  Laterality: Left;  with STENT   ESOPHAGOGASTRODUODENOSCOPY (EGD) WITH PROPOFOL N/A 10/29/2016   Procedure: ESOPHAGOGASTRODUODENOSCOPY (EGD) WITH PROPOFOL;  Surgeon: West Bali, MD;  Location: [facility];  Service: Endoscopy;  Laterality: N/A;   EYE SURGERY     implant and screws put in the right lower jaw  11/2008   dental surgery   kidney blockage  ?1990's   " major surgery ;put kidney on pump for awhile"   LITHOTRIPSY     "several times"   LUMBAR FUSION  08/2010   MASTECTOMY  1997   right   NEPHROLITHOTOMY Left 04/13/2014   Procedure: LEFT PERCUTANEOUS NEPHROLITHOTOMY WITH SURGEON ACCESS;  Surgeon: Crist Fat, MD;  Location: [facility];  Service: Urology;  Laterality: Left;   OVARIAN CYST SURGERY     PARATHYROIDECTOMY  02/26/2012   Procedure: PARATHYROIDECTOMY;  Surgeon: Darletta Moll, MD;  Location[facility];  Service: ENT;;   SAVORY  DILATION N/A 10/29/2016   Procedure: SAVORY DILATION;  Surgeon: West Bali, MD;  Location: [facility];  Service: Endoscopy;  Laterality: N/A;   SPINE SURGERY  2011   urological surgery for blocked ureter secondary to kidney stone     Patient Active Problem List   Diagnosis Date Noted   Gait abnormality 12/19/2022   Peripheral neuropathy 09/17/2022   Right foot pain 05/02/2022   Hypokalemia 05/02/2022   Overactive bladder 02/08/2022   Left knee pain 02/08/2022   Right leg swelling 02/08/2022   Encounter for general adult medical examination with abnormal findings 08/30/2021   Chronic fatigue 08/30/2021   Vitamin B12 deficiency 08/30/2021   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   Balance problem 12/26/2020   Thyroid nodule 01/16/2018   Overweight (BMI 25.0-29.9) 12/18/2017   Diabetic neuropathy (HCC) 10/13/2017   Genetic testing 11/28/2016   Esophageal dysphagia 09/30/2016   Ventral hernia 07/28/2015   Situational anxiety 04/26/2015   Thyromegaly 01/23/2015   Iron deficiency 06/17/2014   DDD (degenerative disc disease), lumbar 01/26/2014   Bulge of lumbar disc without myelopathy 01/26/2014   Renal cyst 01/26/2014   Back pain with left-sided sciatica 12/28/2013   Uncontrolled type 2 diabetes mellitus with hyperglycemia, without long-term current use of insulin (HCC) 08/01/2013   GASTROPARESIS 12/15/2008   GERD (gastroesophageal reflux disease) 06/21/2008   IBS (irritable bowel syndrome) 06/21/2008   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 (HCC) 02/09/2008   Hyperlipidemia 02/09/2008   Essential hypertension 02/09/2008   Insomnia 02/09/2008    ONSET DATE: May 2023:  Referral date 11-27-22  REFERRING DIAG: R26.9 (ICD-10-CM) - Gait abnormality W19.XXXD (ICD-10-CM) - Fall, subsequent encounter  THERAPY DIAG:  Other abnormalities of gait and mobility  Muscle weakness (generalized)  Unsteadiness on feet  Rationale for Evaluation and Treatment: Rehabilitation  SUBJECTIVE:  SUBJECTIVE STATEMENT: Pt reports she is doing well - states her balance is improving  Pt accompanied by: self  PERTINENT HISTORY: This is a 77 year old woman with past medical history of breast cancer, s/p chemotherapy, hypertension, diabetes, who is presenting with multiple falls. Patient reports since May of last year, she has a total of 6 falls resulting in torn ligament, and bruises. She denies any prodromes prior to the fall, no warning signs, no dizziness, no lightheadedness  PAIN: pt reports achy feeling in RLE as usual - says it does not stop her from doing things Are you having pain? Yes: NPRS scale: 4-5/10 Pain location: Rt quads Aggravating factors: no specific factors Relieving factors: ice and Advil  PRECAUTIONS: Fall  WEIGHT BEARING RESTRICTIONS: No  FALLS: Has patient fallen in last 6 months? Yes. Number of falls approx. 3   LIVING ENVIRONMENT: Lives with: lives with their spouse and lives with their daughter Lives in: House/apartment Stairs: Yes: Internal: 12 steps; on right going up and External: 1 steps; none pt does not have to go up to 2nd level Has following equipment at home: Single point cane and Walker - 2 wheeled  PLOF: Independent  PATIENT GOALS: improve walking and leg strength  Today's Treatment:   02-27-23   GAIT: Gait pattern: WFL's  Distance walked: approx. 35' x 2 reps with head turns;  40' x 2 with abrupt stop and turn Assistive device utilized:  No device used Level of assistance: Supervision; CGA for safety with abrupt stop and turn Comments:  horizontal head turns intermittently on 1st rep; vertical head turns on 2nd rep; pt much improved with maintaining balance with abrupt stop/turn  NeuroRe-ed:   Used bar in front of mirror for visual feedback Tandem  walking 10' x 2 reps on floor with CGA to min assist for recovery of LOB Tap ups to 1st step alternating 5 reps each and then to 2nd step 10 reps each leg without UE support with CGA Standing on inverted BOSU inside // bars - bil. UE support - pt performed lateral weight shifts 10 reps; anterior/posterior weight shifting 10 reps with bil. UE support on // bars Pt performed crossovers along bar with use of mirror for visual feedback - 10' x 2 reps; stepping behind 10' x 2 reps with bil. UE support for improved balance with narrow BOS  THEREX: Runner's stretch bil. LE's using 2nd step - 30 sec hold x 1 rep each leg;  gastroc stretch using bottom shelf of cabinet - 20 sec hold x 1 rep each leg Rt ITB stretch in standing - 20 sec hold Step up exercise RLE 10 reps to 6" step with bil. UE support on rail - cues to not pull self up Step down exercise from 4" step for Rt eccentric quad strengthening 10 reps - with RUE support on bar  Updated HEP to include balance exercises: Medbridge ; also added abrupt turn and stop and abrupt stop and turn to practice in home to have UE support prn Access Code: [email or web address] URL: [email or web address] Date: 02/06/2023 Prepared by: Maebelle Munroe  Exercises (BALANCE) - Single Leg Stance  - 1 x daily - 7 x weekly - 1 sets - 2 reps - 10 secs hold - Standing Tandem Balance with Counter Support  - 1 x daily - 7 x weekly - 1 sets - 2 reps - 30 secs hold - Romberg Stance  - 1 x daily - 7 x weekly - 1 sets - 1 reps  12-18-22: Pt instructed in HEP for bil. LE strengthening - Medbridge HEP [email or web address]; pt performed exercises listed below 10 reps each leg; resistance used was red theraband  Access Code: [email or web address] URL: [email or web address] Date: 12/18/2022 Prepared by: Maebelle Munroe  Exercises - Supine Bridge  - 1 x daily - 7 x weekly - 1 sets - 10 reps - 5 sec hold - Single Leg Bridge  - 1 x daily - 7 x weekly - 1 sets - 10 reps - 3 sec hold -  Sidelying Hip Abduction  - 1 x daily - 7 x weekly - 3 sets - 10 reps - Clamshell  - 1 x daily - 7 x weekly - 1 sets - 10 reps - Supine Hip Flexion  - 1 x daily - 7 x weekly - 1 sets - 10 reps - 3 sec hold - Hooklying Clamshell with Resistance  - 1 x daily - 7 x weekly - 1 sets - 10 reps - Standing Hip Extension with Resistance at Ankles and Counter Support  - 1 x daily - 7 x weekly - 1 sets - 10 reps - 3 sec hold hold - Sit to Stand  - 1 x daily - 7 x weekly - 1 sets - 10 reps      PATIENT EDUCATION: Education details: Medbridge HEP [email or web address] (02-06-23) Person educated: Patient Education method: Explanation demonstration and handouts Education comprehension: verbalized understanding and demonstrated understanding  HOME EXERCISE PROGRAM: To be established  GOALS: Goals reviewed with patient? Yes   SHORT TERM GOALS: Target date: 01-10-23  Improve 5x sit to stand score to </= 23 secs with RUE support from standard chair. Baseline:  28.06 secs:   19.53 secs with RUE support  Goal status: Goal met 01-06-23  2.  Amb. 250' with RW with SBA for increased community accessibility.  Baseline:  Goal status: Goal met   3. Improve gait velocity by at least .3 ft/sec with use of SPC for increased gait efficiency. Baseline: 12.97 secs no device on 01-06-23 Goal status: Ongoing - 01-06-23  4.  Improve TUG score to </= 19 secs with SPC for reduced fall risk. Baseline: 22,31 secs with SPC;   14.25 secs Goal status: Goal met 01-06-23  5.  Independent in HEP for bil. LE strengthening and balance exercises.  Baseline:  Goal status: Goal met    LONG TERM GOALS: Target date: 02-07-23  Improve 5x sit to stand score to </= 20 secs with RUE support from standard chair. Baseline:  28.06 secs;   02-06-23 = 19.16 secs without UE support Goal status: Goal met   2.  Amb. 460' with RW with SBA for increased community accessibility.  Baseline: goal met with no device Goal status: Goal met 02-06-23  3.    Improve gait velocity by at least .6 ft/sec with use of SPC for increased gait efficiency. Baseline: 10.34 secs no device = 3.17 ft/sec without device Goal status: Goal met 02-06-23  4.   Improve TUG score to </= 15 secs with SPC for reduced fall risk. Baseline: 22.31 secs; 12.37 secs without device - 02-06-23 Goal status: Goal met 02-06-23  5.  Independent in updated HEP for balance and strengthening. Baseline:  Goal status: Ongoing as new balance exercises added to HEP on 02-06-23    NEW LONG TERM GOALS: Target date: 03-07-23   1.  Improve 5x sit to stand score to </= 15 secs without support from standard chair. Baseline:  28.06 secs;   02-06-23 =  19.16 secs without UE support Goal status: Revised/upgraded  2.  Amb. 6' with horizontal head turns without LOB for increased independence and safety with environmental scanning while walking.    Baseline:  unsteadiness and LOB with amb. With head turns    Goal status:  NEW    3.  Increase gait velocity to >/= 3.4 ft/sec without use of assistive device for increased efficiency with gait.     Baseline;  3.17 ft/sec with no device on 02-06-23    Goal status:  Revised/Upgraded   4.  Pt will demonstrate improved vestibular input in maintaining balance by standing in Romberg position on floor with EC for at least  30 secs without LOB or need for UE support on counter.    Baseline; stood for 30 secs with approx. 5 touches on counter for assist with balance recovery    Goal status:  NEW  5.  Demonstrate improved SLS balance by standing for at least 5 secs on RLE and also on LLE to reduce fall risk and to increase safety with stepping over objects.           Baseline:  RLE SLS = 3.94 secs:  LLE SLS = 4.10 secs Goal status:  NEW  6.  Transfer floor to stand with supervision with minimal UE support on external object.  Baseline:  TBA  Goal status: NEW  7.  Independent in updated HEP.  Baseline:  added new balance exercises on 02-06-23  Goal  status:  ONGOING  ASSESSMENT:  CLINICAL IMPRESSION: PT session focused on balance training with dynamic gait activities including amb. With head turns and amb. With abrupt stop and stop/turn.  Pt had much less postural instability with abrupt stopping in today's session than she had when this activity was first initiated several weeks ago; pt required min assist for balance recovery when first performed.  Pt reported feeling a stretch in her Rt lateral thigh with stepping behind, therefore, ITB stretch performed with pt reporting feeling tightness.  Pt is progressing well.  Cont with POC.     OBJECTIVE IMPAIRMENTS: Abnormal gait, decreased activity tolerance, decreased balance, decreased strength, dizziness, impaired sensation, and pain.   ACTIVITY LIMITATIONS: carrying, bending, standing, squatting, stairs, and locomotion level  PARTICIPATION LIMITATIONS: meal prep, cleaning, laundry, driving, shopping, and community activity  PERSONAL FACTORS: Past/current experiences, Time since onset of injury/illness/exacerbation, and unknown etiology of falls  are also affecting patient's functional outcome.   REHAB POTENTIAL: Good  CLINICAL DECISION MAKING: Evolving/moderate complexity  EVALUATION COMPLEXITY: Moderate  PLAN:  PT FREQUENCY: 2x/week  PT DURATION: 4 weeks  PLANNED INTERVENTIONS: Therapeutic exercises, Therapeutic activity, Neuromuscular re-education, Balance training, Gait training, Patient/Family education, Self Care, Stair training, Vestibular training, DME instructions, and Aquatic Therapy  PLAN FOR NEXT SESSION:  10th visit progress note due next session:  continue strengthening exercises RLE and balance training, dynamic gait   Caera Enwright, Donavan Burnet, PT 02/28/2023, 4:44 PM

## 2023-02-28 ENCOUNTER — Encounter: Payer: Self-pay | Admitting: Physical Therapy

## 2023-03-03 ENCOUNTER — Ambulatory Visit: Payer: Medicare Other | Admitting: Physical Therapy

## 2023-03-03 DIAGNOSIS — R2681 Unsteadiness on feet: Secondary | ICD-10-CM

## 2023-03-03 DIAGNOSIS — M6281 Muscle weakness (generalized): Secondary | ICD-10-CM

## 2023-03-03 DIAGNOSIS — R2689 Other abnormalities of gait and mobility: Secondary | ICD-10-CM | POA: Diagnosis not present

## 2023-03-04 ENCOUNTER — Encounter: Payer: Self-pay | Admitting: Physical Therapy

## 2023-03-04 NOTE — Therapy (Signed)
OUTPATIENT PHYSICAL THERAPY NEURO TREATMENT NOTE/AQUATIC THERAPY/ 10th VISIT PROGRESS NOTE   Progress Note Reporting Period 01-27-23 to 03-03-23  See note below for Objective Data and Assessment of Progress/Goals.  Thank you for this referral.    Patient Name: [name] MRN: [id number] DOB:June 07, 1946, 77 y.o., female Today's Date: 03/04/2023   PCP: Anabel Halon, MD REFERRING PROVIDER: Windell Norfolk, MD  END OF SESSION:  PT End of Session - 03/04/23 2029     Visit Number 20    Number of Visits 22   14 + 8 additional = 22   Date for PT Re-Evaluation 03/07/23    Authorization Type [employer]    Authorization Time Period 12-09-22 - 02-21-23; renewal 5-16 - 03-07-23    PT Start Time 1405    PT Stop Time 1450    PT Time Calculation (min) 45 min    Equipment Utilized During Treatment Other (comment)   bar bells, aquatic cuff   Activity Tolerance Patient tolerated treatment well    Behavior During Therapy WFL for tasks assessed/performed                 Past Medical History:  Diagnosis Date   (HFpEF) heart failure with preserved ejection fraction (HCC)    Abdominal hernia 02/26/2012   unrepaired   Adenocarcinoma of breast (HCC) 1997   right / chemo + tamoxifen x 5 years    Anemia    Anxiety    Aortic atherosclerosis (HCC)    Arthritis    Blood transfusion 1980's   Breast cancer (HCC)    Cancer (HCC)    [employer] 08/18/2020   Complication of anesthesia    has a hard time waking up; can't lay flat   Degenerative disc disease, lumbar    pressing on L3 and L4   Diabetes mellitus (HCC) 1989   Type 2 NIDDM; "cancer treatment gave me diabetes"   Diabetes mellitus without complication (HCC)    [employer] 08/18/2020   Diverticulosis    DJD (degenerative joint disease) of lumbar spine    Dysrhythmia    "irregular"   Exertional dyspnea    Gastroparesis    GERD (gastroesophageal reflux disease)    mann   Heart murmur    "think I outgrew it"   Hepatic  steatosis    History of kidney stones    History of stomach ulcers    Hx: UTI (urinary tract infection)    Hyperlipidemia    Hypersensitivity    in tongue   Hypertension    IBS (irritable bowel syndrome)    Insomnia    Internal hemorrhoids    Kidney cysts    RT KIDNEY   Kidney stones 2009   s/p lithotripsy   Mild CAD    Mild valvular heart disease    Nephrolithiasis 04/13/2014   Obesity    PFO (patent foramen ovale)    PONV (postoperative nausea and vomiting)    Pulmonary nodule    Shortness of breath    unable to lie flat   Ventral hernia    Past Surgical History:  Procedure Laterality Date   ABDOMINAL HYSTERECTOMY  2002   APPENDECTOMY     BACK SURGERY     Can't take any medical procedure in right arm   BREAST BIOPSY     right   BREAST SURGERY N/A    [employer] 04/22/2020   CATARACT EXTRACTION W/ INTRAOCULAR LENS  IMPLANT, BILATERAL  2009-2010   CESAREAN SECTION  1973; 1978; 1981  CESAREAN SECTION N/A    [employer] 04/22/2020   COLONOSCOPY WITH PROPOFOL N/A 10/29/2016   Procedure: COLONOSCOPY WITH PROPOFOL;  Surgeon: West Bali, MD;  Location: [facility];  Service: Endoscopy;  Laterality: N/A;  10:00 am   CYSTOSCOPY Left 04/13/2014   Procedure: CYSTOSCOPY FLEXIBLE;  Surgeon: Crist Fat, MD;  Location: [facility];  Service: Urology;  Laterality: Left;  with STENT   ESOPHAGOGASTRODUODENOSCOPY (EGD) WITH PROPOFOL N/A 10/29/2016   Procedure: ESOPHAGOGASTRODUODENOSCOPY (EGD) WITH PROPOFOL;  Surgeon: West Bali, MD;  Location: [facility];  Service: Endoscopy;  Laterality: N/A;   EYE SURGERY     implant and screws put in the right lower jaw  11/2008   dental surgery   kidney blockage  ?1990's   " major surgery ;put kidney on pump for awhile"   LITHOTRIPSY     "several times"   LUMBAR FUSION  08/2010   MASTECTOMY  1997   right   NEPHROLITHOTOMY Left 04/13/2014   Procedure: LEFT PERCUTANEOUS NEPHROLITHOTOMY WITH SURGEON ACCESS;  Surgeon: Crist Fat,  MD;  Location: [facility];  Service: Urology;  Laterality: Left;   OVARIAN CYST SURGERY     PARATHYROIDECTOMY  02/26/2012   Procedure: PARATHYROIDECTOMY;  Surgeon: Darletta Moll, MD;  Location: [facility];  Service: ENT;;   SAVORY DILATION N/A 10/29/2016   Procedure: SAVORY DILATION;  Surgeon: West Bali, MD;  Location: [facility];  Service: Endoscopy;  Laterality: N/A;   SPINE SURGERY  2011   urological surgery for blocked ureter secondary to kidney stone     Patient Active Problem List   Diagnosis Date Noted   Gait abnormality 12/19/2022   Peripheral neuropathy 09/17/2022   Right foot pain 05/02/2022   Hypokalemia 05/02/2022   Overactive bladder 02/08/2022   Left knee pain 02/08/2022   Right leg swelling 02/08/2022   Encounter for general adult medical examination with abnormal findings 08/30/2021   Chronic fatigue 08/30/2021   Vitamin B12 deficiency 08/30/2021   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   Balance problem 12/26/2020   Thyroid nodule 01/16/2018   Overweight (BMI 25.0-29.9) 12/18/2017   Diabetic neuropathy (HCC) 10/13/2017   Genetic testing 11/28/2016   Esophageal dysphagia 09/30/2016   Ventral hernia 07/28/2015   Situational anxiety 04/26/2015   Thyromegaly 01/23/2015   Iron deficiency 06/17/2014   DDD (degenerative disc disease), lumbar 01/26/2014   Bulge of lumbar disc without myelopathy 01/26/2014   Renal cyst 01/26/2014   Back pain with left-sided sciatica 12/28/2013   Uncontrolled type 2 diabetes mellitus with hyperglycemia, without long-term current use of insulin (HCC) 08/01/2013   GASTROPARESIS 12/15/2008   GERD (gastroesophageal reflux disease) 06/21/2008   IBS (irritable bowel syndrome) 06/21/2008   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 (HCC) 02/09/2008   Hyperlipidemia 02/09/2008   Essential hypertension 02/09/2008   Insomnia 02/09/2008    ONSET DATE: May 2023:  Referral date 11-27-22  REFERRING DIAG: R26.9 (ICD-10-CM) - Gait abnormality W19.XXXD (ICD-10-CM)  - Fall, subsequent encounter  THERAPY DIAG:  Other abnormalities of gait and mobility  Muscle weakness (generalized)  Unsteadiness on feet  Rationale for Evaluation and Treatment: Rehabilitation  SUBJECTIVE:  SUBJECTIVE STATEMENT: Pt reports she is doing much better; may join [facility] to be able to continue pool exercise after D/C from PT. Pt accompanied by: self  PERTINENT HISTORY: This is a 77 year old woman with past medical history of breast cancer, s/p chemotherapy, hypertension, diabetes, who is presenting with multiple falls. Patient reports since May of last year, she has a total of 6 falls resulting in torn ligament, and bruises. She denies any problems prior to the fall, no warning signs, no dizziness, no lightheadedness  PAIN:  03-04-23 - pt reports not really true pain -just soreness in Rt leg  Are you having pain? Yes: NPRS scale: 2/10 Pain location: Rt quads Aggravating factors: no specific factors Relieving factors: ice and Advil  PRECAUTIONS: Fall  WEIGHT BEARING RESTRICTIONS: No  FALLS: Has patient fallen in last 6 months? Yes. Number of falls approx. 3   LIVING ENVIRONMENT: Lives with: lives with their spouse and lives with their daughter Lives in: House/apartment Stairs: Yes: Internal: 12 steps; on right going up and External: 1 steps; none pt does not have to go up to 2nd level Has following equipment at home: Single point cane and Walker - 2 wheeled  PLOF: Independent  PATIENT GOALS: improve walking and leg strength   OBJECTIVE:  Aquatic therapy at [facility] - pool temp 92 degrees  Patient seen for aquatic therapy today.  Treatment took place in water 3.6-4.5 feet deep depending upon activity.  Pt entered and exited the pool via step negotiation using bil. Hand rails  with supervision.  Pt performed water walking for warm up - forwards, backwards, and sideways -- 4 laps (18' each direction) - pt used no UE support on floatation device  Marching in place 10 reps each leg without UE support; marching forwards 18' x 2 reps with UE support on small barbells:  backwards marching 18' x 1 rep with small barbells for assist with balance  Squats bil. LE's 10 reps; RLE unilateral squat 10 reps with bil. UE support  Rt hip AROM exercises - hip flexion, abduction, extension with use of aquatic cuff 15 reps for increased resistance with the eccentric contraction; hip abdct./adduction with knee flexed at 90 degrees 15 reps with use of aquatic cuff for increased resistance; Rt hip adduction/abduction with use of aquatic cuff 15 reps with UE support on pool edge  Pt performed crossovers 18' x 2 reps with UE support on large bar bell; stepping behind 18' x 2 reps with UE support  Pt performed partial tandem walking 18' across pool without UE support - 2 reps  Pt performed "Balancing" ai chi posture with tactile cues for correct sequence of UE with LE's - 10 reps each side - pt did need min. UE support on pool edge for assist with balance  Pt stood on RLE - made circles clockwise 5 reps, then counter clockwise 5 reps with LLE to improve SLS on RLE: pt then performed same exercise standing on LLE for improved SLS on RLE  Pt requires buoyancy of water for support for reduced fall risk and for unloading/reduced stress on joints as pt able to tolerate increased standing and ambulation in water compared to that on land; viscosity of water is needed for resistance for strengthening and current of water provides perturbations for challenge for balance training            12-18-22: Pt instructed in HEP for bil. LE strengthening - [facility] HEP [email or web address]; pt performed exercises listed below 10 reps  each leg; resistance used was red theraband  Access Code: [email or web address] URL:  [email or web address] Date: 12/18/2022 Prepared by: Maebelle Munroe  Exercises - Supine Bridge  - 1 x daily - 7 x weekly - 1 sets - 10 reps - 5 sec hold - Single Leg Bridge  - 1 x daily - 7 x weekly - 1 sets - 10 reps - 3 sec hold - Sidelying Hip Abduction  - 1 x daily - 7 x weekly - 3 sets - 10 reps - Clamshell  - 1 x daily - 7 x weekly - 1 sets - 10 reps - Supine Hip Flexion  - 1 x daily - 7 x weekly - 1 sets - 10 reps - 3 sec hold - Hooklying Clamshell with Resistance  - 1 x daily - 7 x weekly - 1 sets - 10 reps - Standing Hip Extension with Resistance at Ankles and Counter Support  - 1 x daily - 7 x weekly - 1 sets - 10 reps - 3 sec hold hold - Sit to Stand  - 1 x daily - 7 x weekly - 1 sets - 10 reps      PATIENT EDUCATION: Education details: [facility] HEP;   12-24-22 - added piriformis stretch and heel raises to HEP  Person educated: Patient Education method: Explanation Education comprehension: verbalized understanding  HOME EXERCISE PROGRAM: To be established  GOALS: Goals reviewed with patient? Yes   SHORT TERM GOALS: Target date: 01-10-23   Improve 5x sit to stand score to </= 23 secs with RUE support from standard chair. Baseline:  28.06 secs:   19.53 secs with RUE support  Goal status: Goal met 01-06-23  2.  Amb. 250' with RW with SBA for increased community accessibility.  Baseline:  Goal status: Goal met   3. Improve gait velocity by at least .3 ft/sec with use of SPC for increased gait efficiency. Baseline: 12.97 secs no device on 01-06-23 Goal status: Ongoing - 01-06-23  4.  Improve TUG score to </= 19 secs with SPC for reduced fall risk. Baseline: 22,31 secs with SPC;   14.25 secs Goal status: Goal met 01-06-23  5.  Independent in HEP for bil. LE strengthening and balance exercises.  Baseline:  Goal status: Goal met    LONG TERM GOALS: Target date: 02-07-23  Improve 5x sit to stand score to </= 20 secs with RUE support from standard  chair. Baseline:  28.06 secs;   02-06-23 = 19.16 secs without UE support Goal status: Goal met   2.  Amb. 460' with RW with SBA for increased community accessibility.  Baseline: goal met with no device Goal status: Goal met 02-06-23  3.   Improve gait velocity by at least .6 ft/sec with use of SPC for increased gait efficiency. Baseline: 10.34 secs no device = 3.17 ft/sec without device Goal status: Goal met 02-06-23  4.   Improve TUG score to </= 15 secs with SPC for reduced fall risk. Baseline: 22.31 secs; 12.37 secs without device - 02-06-23 Goal status: Goal met 02-06-23  5.  Independent in updated HEP for balance and strengthening. Baseline:  Goal status: Ongoing as new balance exercises added to HEP on 02-06-23    NEW LONG TERM GOALS: Target date: 03-07-23   1.  Improve 5x sit to stand score to </= 15 secs without support from standard chair. Baseline:  28.06 secs;   02-06-23 = 19.16 secs without UE support Goal status: Revised/upgraded  2.  Amb. 55' with  horizontal head turns without LOB for increased independence and safety with environmental scanning while walking.    Baseline:  unsteadiness and LOB with amb. With head turns    Goal status:  NEW    3.  Increase gait velocity to >/= 3.4 ft/sec without use of assistive device for increased efficiency with gait.     Baseline;  3.17 ft/sec with no device on 02-06-23    Goal status:  Revised/Upgraded   4.  Pt will demonstrate improved vestibular input in maintaining balance by standing in Romberg position on floor with EC for at least  30 secs without LOB or need for UE support on counter.    Baseline; stood for 30 secs with approx. 5 touches on counter for assist with balance recovery    Goal status:  NEW  5.  Demonstrate improved SLS balance by standing for at least 5 secs on RLE and also on LLE to reduce fall risk and to increase safety with stepping over objects.           Baseline:  RLE SLS = 3.94 secs:  LLE SLS = 4.10  secs Goal status:  NEW  6.  Transfer floor to stand with supervision with minimal UE support on external object.  Baseline:  TBA  Goal status: NEW  7.  Independent in updated HEP.  Baseline:  added new balance exercises on 02-06-23  Goal status:  ONGOING     ASSESSMENT:  CLINICAL IMPRESSION: This 10th visit progress note covers dates 01-27-23 - 03-03-23.  Pt met 5/5 LTG's as assessed on 02-06-23.  Pt has progressed from using RW to [facility] to currently no device for assistance with ambulation.  Pt has 1 more land PT appt prior to planned D/C on 03-06-23.  Today's aquatic PT session focused on RLE strengthening and water walking in various directions for improved dynamic standing balance.  Pt demonstrates improved balance with water walking with no floatation device used for UE support in today's session.  Pt is progressing well towards goals. Cont with POC.   OBJECTIVE IMPAIRMENTS: Abnormal gait, decreased activity tolerance, decreased balance, decreased strength, dizziness, impaired sensation, and pain.   ACTIVITY LIMITATIONS: carrying, bending, standing, squatting, stairs, and locomotion level  PARTICIPATION LIMITATIONS: meal prep, cleaning, laundry, driving, shopping, and community activity  PERSONAL FACTORS: Past/current experiences, Time since onset of injury/illness/exacerbation, and unknown etiology of falls  are also affecting patient's functional outcome.   REHAB POTENTIAL: Good  CLINICAL DECISION MAKING: Evolving/moderate complexity  EVALUATION COMPLEXITY: Moderate  PLAN:  PT FREQUENCY: 2x/week  PT DURATION: 8 weeks  PLANNED INTERVENTIONS: Therapeutic exercises, Therapeutic activity, Neuromuscular re-education, Balance training, Gait training, Patient/Family education, Self Care, Stair training, Vestibular training, DME instructions, and Aquatic Therapy  PLAN FOR NEXT SESSION:   check LTG's - D/C next session   Alverna Fawley, Donavan Burnet, PT 03/04/2023, 8:34 PM

## 2023-03-06 ENCOUNTER — Ambulatory Visit: Payer: Medicare Other | Admitting: Physical Therapy

## 2023-03-06 DIAGNOSIS — R2689 Other abnormalities of gait and mobility: Secondary | ICD-10-CM | POA: Diagnosis not present

## 2023-03-06 DIAGNOSIS — M6281 Muscle weakness (generalized): Secondary | ICD-10-CM

## 2023-03-06 DIAGNOSIS — R2681 Unsteadiness on feet: Secondary | ICD-10-CM

## 2023-03-06 NOTE — Therapy (Signed)
OUTPATIENT PHYSICAL THERAPY NEURO TREATMENT NOTE/DISCHARGE SUMMARY   Patient Name: [name] MRN: [id number] DOB:August 30, 1946, 77 y.o., female Today's Date: 03/07/2023   PCP: Anabel Halon, MD REFERRING PROVIDER: Windell Norfolk, MD  END OF SESSION:  PT End of Session - 03/07/23 1256     Visit Number 21    Number of Visits 22   14 + 8 additional = 22   Date for PT Re-Evaluation 03/07/23    Authorization Type [employer] Medicare    Authorization Time Period 12-09-22 - 02-21-23; renewal 5-16 - 03-07-23    PT Start Time 1103    PT Stop Time 1147    PT Time Calculation (min) 44 min    Equipment Utilized During Treatment --    Activity Tolerance Patient tolerated treatment well    Behavior During Therapy WFL for tasks assessed/performed                        Past Medical History:  Diagnosis Date   (HFpEF) heart failure with preserved ejection fraction (HCC)    Abdominal hernia 02/26/2012   unrepaired   Adenocarcinoma of breast (HCC) 1997   right / chemo + tamoxifen x 5 years    Anemia    Anxiety    Aortic atherosclerosis (HCC)    Arthritis    Blood transfusion 1980's   Breast cancer (HCC)    Cancer (HCC)    [employer]reesia 08/18/2020   Complication of anesthesia    has a hard time waking up; can't lay flat   Degenerative disc disease, lumbar    pressing on L3 and L4   Diabetes mellitus (HCC) 1989   Type 2 NIDDM; "cancer treatment gave me diabetes"   Diabetes mellitus without complication (HCC)    [employer]reesia 08/18/2020   Diverticulosis    DJD (degenerative joint disease) of lumbar spine    Dysrhythmia    "irregular"   Exertional dyspnea    Gastroparesis    GERD (gastroesophageal reflux disease)    mann   Heart murmur    "think I outgrew it"   Hepatic steatosis    History of kidney stones    History of stomach ulcers    Hx: UTI (urinary tract infection)    Hyperlipidemia    Hypersensitivity    in tongue   Hypertension    IBS (irritable bowel syndrome)     Insomnia    Internal hemorrhoids    Kidney cysts    RT KIDNEY   Kidney stones 2009   s/p lithotripsy   Mild CAD    Mild valvular heart disease    Nephrolithiasis 04/13/2014   Obesity    PFO (patent foramen ovale)    PONV (postoperative nausea and vomiting)    Pulmonary nodule    Shortness of breath    unable to lie flat   Ventral hernia    Past Surgical History:  Procedure Laterality Date   ABDOMINAL HYSTERECTOMY  2002   APPENDECTOMY     BACK SURGERY     Can't take any medical procedure in right arm   BREAST BIOPSY     right   BREAST SURGERY N/A    [employer]reesia 04/22/2020   CATARACT EXTRACTION W/ INTRAOCULAR LENS  IMPLANT, BILATERAL  2009-2010   CESAREAN SECTION  1973; 1978; 1981   CESAREAN SECTION N/A    [employer]reesia 04/22/2020   COLONOSCOPY WITH PROPOFOL N/A 10/29/2016   Procedure: COLONOSCOPY WITH PROPOFOL;  Surgeon: West Bali, MD;  Location: [facility];  Service: Endoscopy;  Laterality: N/A;  10:00 am   CYSTOSCOPY Left 04/13/2014   Procedure: CYSTOSCOPY FLEXIBLE;  Surgeon: Crist Fat, MD;  Location: [facility];  Service: Urology;  Laterality: Left;  with STENT   ESOPHAGOGASTRODUODENOSCOPY (EGD) WITH PROPOFOL N/A 10/29/2016   Procedure: ESOPHAGOGASTRODUODENOSCOPY (EGD) WITH PROPOFOL;  Surgeon: West Bali, MD;  Location: [facility];  Service: Endoscopy;  Laterality: N/A;   EYE SURGERY     implant and screws put in the right lower jaw  11/2008   dental surgery   kidney blockage  ?1990's   " major surgery ;put kidney on pump for awhile"   LITHOTRIPSY     "several times"   LUMBAR FUSION  08/2010   MASTECTOMY  1997   right   NEPHROLITHOTOMY Left 04/13/2014   Procedure: LEFT PERCUTANEOUS NEPHROLITHOTOMY WITH SURGEON ACCESS;  Surgeon: Crist Fat, MD;  Location: [facility];  Service: Urology;  Laterality: Left;   OVARIAN CYST SURGERY     PARATHYROIDECTOMY  02/26/2012   Procedure: PARATHYROIDECTOMY;  Surgeon: Darletta Moll, MD;  [facility];  Service: ENT;;    SAVORY DILATION N/A 10/29/2016   Procedure: SAVORY DILATION;  Surgeon: West Bali, MD;  Location: [facility];  Service: Endoscopy;  Laterality: N/A;   SPINE SURGERY  2011   urological surgery for blocked ureter secondary to kidney stone     Patient Active Problem List   Diagnosis Date Noted   Gait abnormality 12/19/2022   Peripheral neuropathy 09/17/2022   Right foot pain 05/02/2022   Hypokalemia 05/02/2022   Overactive bladder 02/08/2022   Left knee pain 02/08/2022   Right leg swelling 02/08/2022   Encounter for general adult medical examination with abnormal findings 08/30/2021   Chronic fatigue 08/30/2021   Vitamin B12 deficiency 08/30/2021   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   Balance problem 12/26/2020   Thyroid nodule 01/16/2018   Overweight (BMI 25.0-29.9) 12/18/2017   Diabetic neuropathy (HCC) 10/13/2017   Genetic testing 11/28/2016   Esophageal dysphagia 09/30/2016   Ventral hernia 07/28/2015   Situational anxiety 04/26/2015   Thyromegaly 01/23/2015   Iron deficiency 06/17/2014   DDD (degenerative disc disease), lumbar 01/26/2014   Bulge of lumbar disc without myelopathy 01/26/2014   Renal cyst 01/26/2014   Back pain with left-sided sciatica 12/28/2013   Uncontrolled type 2 diabetes mellitus with hyperglycemia, without long-term current use of insulin (HCC) 08/01/2013   GASTROPARESIS 12/15/2008   GERD (gastroesophageal reflux disease) 06/21/2008   IBS (irritable bowel syndrome) 06/21/2008   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 (HCC) 02/09/2008   Hyperlipidemia 02/09/2008   Essential hypertension 02/09/2008   Insomnia 02/09/2008    ONSET DATE: May 2023:  Referral date 11-27-22  REFERRING DIAG: R26.9 (ICD-10-CM) - Gait abnormality W19.XXXD (ICD-10-CM) - Fall, subsequent encounter  THERAPY DIAG:  Other abnormalities of gait and mobility  Muscle weakness (generalized)  Unsteadiness on feet  Rationale for Evaluation and Treatment:  Rehabilitation  SUBJECTIVE:  SUBJECTIVE STATEMENT: Pt reports she is doing well - is ready to finish up today; reports she is extremely pleased with the progress she has made.  May join [facility] to continue with pool exercise  Pt accompanied by: self  PERTINENT HISTORY: This is a 77 year old woman with past medical history of breast cancer, s/p chemotherapy, hypertension, diabetes, who is presenting with multiple falls. Patient reports since May of last year, she has a total of 6 falls resulting in torn ligament, and bruises. She denies any prodromes prior to the fall, no warning signs, no dizziness, no lightheadedness  PAIN: pt reports achy feeling in RLE as usual - says it does not stop her from doing things Are you having pain? Yes: NPRS scale: 4-5/10 Pain location: Rt quads Aggravating factors: no specific factors Relieving factors: ice and Advil  PRECAUTIONS: Fall  WEIGHT BEARING RESTRICTIONS: No  FALLS: Has patient fallen in last 6 months? Yes. Number of falls approx. 3   LIVING ENVIRONMENT: Lives with: lives with their spouse and lives with their daughter Lives in: House/apartment Stairs: Yes: Internal: 12 steps; on right going up and External: 1 steps; none pt does not have to go up to 2nd level Has following equipment at home: Single point cane and Walker - 2 wheeled  PLOF: Independent  PATIENT GOALS: improve walking and leg strength  Today's Treatment:   03-06-23   GAIT: Gait pattern: WFL's  Distance walked:  clinic distances Assistive device utilized:  No device used Level of assistance:   Modified independent Comments:  pt amb. 50' x 2 reps with horizontal head turns without LOB and only very mild deviation in path   Gait velocity:  10.00 secs = 3.28 ft/sec without  device   NeuroRe-ed:    Sit to stand transfer; pt performed 5x sit to stand from high/low mat table 18.44 secs without UE support:  performed from standard chair 17.28 secs without UE support  Pt performed standing in Romberg position - feet on floor - for 30 secs without LOB with EC  Single limb stance:  LLE 4.53 secs:  RLE 7.34 secs  Added balance exercises to HEP:  Access Code: [email or web address]- 03-06-23 URL: [email or web address] Date: 03/07/2023 Prepared by: Maebelle Munroe  Exercises - Single Leg Stance with Support  - 1 x daily - 7 x weekly - 1 sets - 2 reps - 10 sec hold - Romberg Stance with Head Nods  - 1 x daily - 7 x weekly - 1 sets - 1-2 reps - 30 sec hold - Standing Romberg to 1/2 Tandem Stance  - 1 x daily - 7 x weekly - 1 sets - 1-2 reps - 30 sec hold  Access Code: [email or web address] -AQUATIC HEP - 03-06-23 URL: [email or web address] Date: 03/07/2023 Prepared by: Maebelle Munroe  Exercises - Gastroc Stretch on Wall  - 1 x daily - 7 x weekly - 1 sets - 1-2 reps - 30 sec hold - Standing Bilateral Gastroc Stretch with Step  - 1 x daily - 7 x weekly - 1 sets - 1-2 reps - 30 sec  hold - Forward Walking  - 1 x daily - 7 x weekly - 3 sets - 10 reps - Side Stepping  - 1 x daily - 7 x weekly - 3 sets - 10 reps - Backward Walking  - 1 x daily - 7 x weekly - 3 sets - 10 reps - Forward March  - 1 x daily - 7 x weekly - 3 sets -  10 reps - Squat  - 1 x daily - 7 x weekly - 1 sets - 10 reps - Step Forward with Opposite Arm Reach  - 1 x daily - 7 x weekly - 3 sets - 10 reps - Standing Hip Abduction Adduction at Pool Wall  - 1 x daily - 7 x weekly - 3 sets - 10 reps - Standing Hip Flexion Extension at [employer]  - 1 x daily - 7 x weekly - 3 sets - 10 reps - Standing Hip Circles at [employer]  - 1 x daily - 7 x weekly - 1 sets - 10 reps   THERACT: Pt performed floor to stand transfer - used UE support on mat for assist with balance during transitional movement; pt performed  with SBA and with verbal cues for correct technique and positioning of LE's;  pt had no LOB with this transfer    Updated HEP to include balance exercises: Medbridge ; also added abrupt turn and stop and abrupt stop and turn to practice in home to have UE support prn Access Code: [email or web address] URL: [email or web address] Date: 02/06/2023 Prepared by: Maebelle Munroe  Exercises (BALANCE) - Single Leg Stance  - 1 x daily - 7 x weekly - 1 sets - 2 reps - 10 secs hold - Standing Tandem Balance with Counter Support  - 1 x daily - 7 x weekly - 1 sets - 2 reps - 30 secs hold - Romberg Stance  - 1 x daily - 7 x weekly - 1 sets - 1 reps  12-18-22: Pt instructed in HEP for bil. LE strengthening - Medbridge HEP [email or web address]; pt performed exercises listed below 10 reps each leg; resistance used was red theraband  Access Code: [email or web address] URL: [email or web address] Date: 12/18/2022 Prepared by: Maebelle Munroe  Exercises - Supine Bridge  - 1 x daily - 7 x weekly - 1 sets - 10 reps - 5 sec hold - Single Leg Bridge  - 1 x daily - 7 x weekly - 1 sets - 10 reps - 3 sec hold - Sidelying Hip Abduction  - 1 x daily - 7 x weekly - 3 sets - 10 reps - Clamshell  - 1 x daily - 7 x weekly - 1 sets - 10 reps - Supine Hip Flexion  - 1 x daily - 7 x weekly - 1 sets - 10 reps - 3 sec hold - Hooklying Clamshell with Resistance  - 1 x daily - 7 x weekly - 1 sets - 10 reps - Standing Hip Extension with Resistance at Ankles and Counter Support  - 1 x daily - 7 x weekly - 1 sets - 10 reps - 3 sec hold hold - Sit to Stand  - 1 x daily - 7 x weekly - 1 sets - 10 reps      PATIENT EDUCATION: Education details: Updated HEP - Medbridge - see above - 03-06-23- BALANCE  ([id number])- AND AQUATIC HEP ([id number])  Person educated: Patient Education method: Explanation demonstration and handouts Education comprehension: verbalized understanding and demonstrated understanding  HOME EXERCISE PROGRAM: To be  established  GOALS: Goals reviewed with patient? Yes   SHORT TERM GOALS: Target date: 01-10-23  Improve 5x sit to stand score to </= 23 secs with RUE support from standard chair. Baseline:  28.06 secs:   19.53 secs with RUE support  Goal status: Goal met 01-06-23  2.  Amb. 250' with RW with SBA for increased community accessibility.  Baseline:  Goal status:  Goal met   3. Improve gait velocity by at least .3 ft/sec with use of SPC for increased gait efficiency. Baseline: 12.97 secs no device on 01-06-23 Goal status: Ongoing - 01-06-23  4.  Improve TUG score to </= 19 secs with SPC for reduced fall risk. Baseline: 22,31 secs with SPC;   14.25 secs Goal status: Goal met 01-06-23  5.  Independent in HEP for bil. LE strengthening and balance exercises.  Baseline:  Goal status: Goal met    LONG TERM GOALS: Target date: 02-07-23  Improve 5x sit to stand score to </= 20 secs with RUE support from standard chair. Baseline:  28.06 secs;   02-06-23 = 19.16 secs without UE support Goal status: Goal met   2.  Amb. 460' with RW with SBA for increased community accessibility.  Baseline: goal met with no device Goal status: Goal met 02-06-23  3.   Improve gait velocity by at least .6 ft/sec with use of SPC for increased gait efficiency. Baseline: 10.34 secs no device = 3.17 ft/sec without device Goal status: Goal met 02-06-23  4.   Improve TUG score to </= 15 secs with SPC for reduced fall risk. Baseline: 22.31 secs; 12.37 secs without device - 02-06-23 Goal status: Goal met 02-06-23  5.  Independent in updated HEP for balance and strengthening. Baseline:  Goal status: Ongoing as new balance exercises added to HEP on 02-06-23    NEW LONG TERM GOALS: Target date: 03-07-23   1.  Improve 5x sit to stand score to </= 15 secs without support from standard chair. Baseline:  28.06 secs;   02-06-23 = 19.16 secs without UE support;   03-06-23: 18.44 secs from mat table:  17.28 secs from standard  chair- without UE support Goal status: Partially met - 03-06-23  2.  Amb. 9' with horizontal head turns without LOB for increased independence and safety with environmental scanning while walking.    Baseline:  unsteadiness and LOB with amb. With head turns;  NO LOB on 03-06-23    Goal status:  Goal met 03-06-23    3.  Increase gait velocity to >/= 3.4 ft/sec without use of assistive device for increased efficiency with gait.     Baseline;  3.17 ft/sec with no device on 02-06-23 ; 10.00 secs = 3.28 ft/sec on 03-06-23    Goal status: Partially met 03-06-23  4.  Pt will demonstrate improved vestibular input in maintaining balance by standing in Romberg position on floor with EC for at least  30 secs without LOB or need for UE support on counter.    Baseline; stood for 30 secs with approx. 5 touches on counter for assist with balance recovery; 03-06-23 -- no LOB/ no UE support    Goal status:  Goal met 03-06-23  5.  Demonstrate improved SLS balance by standing for at least 5 secs on RLE and also on LLE to reduce fall risk and to increase safety with stepping over objects.           Baseline:  RLE SLS = 3.94 secs:  LLE SLS = 4.10 secs;   03-06-23:  4.53 secs on LLE: 7.34 secs on RLE Goal status: Partially met 03-06-23  6.  Transfer floor to stand with supervision with minimal UE support on external object.  Baseline:  TBA  Goal status: Goal met 03-06-23  7.  Independent in updated HEP.  Baseline:  added new balance exercises on 02-06-23  Goal status:  Goal met 03-06-23  ASSESSMENT:  CLINICAL IMPRESSION: PT session focused on LTG assessment for discharge from PT at this time.  Pt has met LTG's #2, 4, 6 and 7:  LTG's #1, 3 and 5 partially met as pt's scores have improved with these tests but not to stated goal level.  Pt performed floor to stand transfer with UE support on mat without LOB, with SBA - meeting LTG #6.  Pt has made excellent progress in PT - pt is discharged due to goals met and no further  needs identified at this time.  Pt agrees with D/C and states she is pleased with progress achieved.     OBJECTIVE IMPAIRMENTS: Abnormal gait, decreased activity tolerance, decreased balance, decreased strength, dizziness, impaired sensation, and pain.   ACTIVITY LIMITATIONS: carrying, bending, standing, squatting, stairs, and locomotion level  PARTICIPATION LIMITATIONS: meal prep, cleaning, laundry, driving, shopping, and community activity  PERSONAL FACTORS: Past/current experiences, Time since onset of injury/illness/exacerbation, and unknown etiology of falls  are also affecting patient's functional outcome.   REHAB POTENTIAL: Good  CLINICAL DECISION MAKING: Evolving/moderate complexity  EVALUATION COMPLEXITY: Moderate  PLAN:  PT FREQUENCY: 2x/week  PT DURATION: 4 weeks  PLANNED INTERVENTIONS: Therapeutic exercises, Therapeutic activity, Neuromuscular re-education, Balance training, Gait training, Patient/Family education, Self Care, Stair training, Vestibular training, DME instructions, and Aquatic Therapy  PLAN FOR NEXT SESSION:  D/C - 03-06-23    PHYSICAL THERAPY DISCHARGE SUMMARY  Visits from Start of Care: 21  Current functional level related to goals / functional outcomes: See above for progress towards goals: pt has made excellent progress with improving gait, balance and RLE strength and AROM.  Pt has progressed from using RW for assistance with ambulation to use of no device at this time.   Remaining deficits: Continue decreased high level balance skills - significantly impacted by peripheral neuropathy in her feet   Education / Equipment: Pt has been instructed in HEP for balance and LE strengthening exercises and also in aquatic therapy exercises to continue at aquatic facility of pt's choice.    Patient agrees to discharge. Patient goals were partially met. Patient is being discharged due to meeting the stated rehab goals.  No further needs identified at this  time.    Kary Kos, PT 03/07/2023, 1:34 PM

## 2023-03-07 ENCOUNTER — Encounter: Payer: Self-pay | Admitting: Physical Therapy

## 2023-03-20 ENCOUNTER — Ambulatory Visit: Payer: Medicare Other | Admitting: Podiatry

## 2023-04-24 ENCOUNTER — Ambulatory Visit: Payer: Medicare Other | Admitting: Internal Medicine

## 2023-05-02 ENCOUNTER — Ambulatory Visit: Payer: Medicare Other | Admitting: Internal Medicine

## 2023-05-02 ENCOUNTER — Encounter: Payer: Self-pay | Admitting: Internal Medicine

## 2023-05-02 VITALS — BP 133/75 | HR 95 | Ht 66.0 in | Wt 176.0 lb

## 2023-05-02 DIAGNOSIS — I10 Essential (primary) hypertension: Secondary | ICD-10-CM

## 2023-05-02 DIAGNOSIS — E1165 Type 2 diabetes mellitus with hyperglycemia: Secondary | ICD-10-CM | POA: Diagnosis not present

## 2023-05-02 DIAGNOSIS — G720 Drug-induced myopathy: Secondary | ICD-10-CM | POA: Diagnosis not present

## 2023-05-02 DIAGNOSIS — E1143 Type 2 diabetes mellitus with diabetic autonomic (poly)neuropathy: Secondary | ICD-10-CM | POA: Diagnosis not present

## 2023-05-02 DIAGNOSIS — E876 Hypokalemia: Secondary | ICD-10-CM

## 2023-05-02 DIAGNOSIS — T466X5D Adverse effect of antihyperlipidemic and antiarteriosclerotic drugs, subsequent encounter: Secondary | ICD-10-CM

## 2023-05-02 DIAGNOSIS — E162 Hypoglycemia, unspecified: Secondary | ICD-10-CM

## 2023-05-02 DIAGNOSIS — Z7984 Long term (current) use of oral hypoglycemic drugs: Secondary | ICD-10-CM

## 2023-05-02 MED ORDER — DULOXETINE HCL 30 MG PO CPEP
30.0000 mg | ORAL_CAPSULE | Freq: Every day | ORAL | 3 refills | Status: DC
Start: 2023-05-02 — End: 2023-06-05

## 2023-05-02 MED ORDER — GLIPIZIDE 10 MG PO TABS
10.0000 mg | ORAL_TABLET | Freq: Two times a day (BID) | ORAL | 3 refills | Status: DC
Start: 2023-05-02 — End: 2024-08-04

## 2023-05-02 MED ORDER — AMLODIPINE BESYLATE 5 MG PO TABS
5.0000 mg | ORAL_TABLET | Freq: Every day | ORAL | 3 refills | Status: DC
Start: 2023-05-02 — End: 2023-11-06

## 2023-05-02 MED ORDER — DEXCOM G7 RECEIVER DEVI: 0 refills | Status: DC

## 2023-05-02 MED ORDER — METFORMIN HCL ER 500 MG PO TB24
500.0000 mg | ORAL_TABLET | Freq: Every day | ORAL | 3 refills | Status: DC
Start: 2023-05-02 — End: 2023-05-22

## 2023-05-02 MED ORDER — DEXCOM G7 SENSOR MISC: 5 refills | Status: DC

## 2023-05-02 NOTE — Assessment & Plan Note (Addendum)
Did not tolerate gabapentin or Lyrica Unclear if her gait disturbance is due to diabetic neuropathy She has chronic burning pain and tingling in the UE and LE Had started Cymbalta 30 mg QD, needs to start taking it Followed by neurology Check CMP, Mg and B12 level

## 2023-05-02 NOTE — Patient Instructions (Signed)
Please start taking Metformin once daily. Continue taking Glipizide as prescribed.  Please start taking Duloxetine as prescribed for neuropathy/gait problems.  Please continue to take other medications as prescribed.  Please continue to follow low carb diet and perform moderate exercise/walking as tolerated.

## 2023-05-02 NOTE — Progress Notes (Signed)
Established Patient Office Visit  Subjective:  Patient ID: [name], female    DOB: 06-Jun-1946  Age: 77 y.o. MRN: [id number]  CC:  Chief Complaint  Patient presents with   Diabetes    Four month follow up    gait imbalanace    Patient feels her body is shutting down, having issues with balance and walking     HPI [name] is a 77 y.o. female with past medical history of HTN, DM, breast ca s/p right mastectomy and chemotherapy, HLD, DDD of lumbar spine and GERD who presents for f/u of her chronic medical conditions.  Her BP was wnl today.  She has been taking amlodipine and metoprolol now.  She denies any headache, chest pain, dyspnea or palpitations.    She has chronic dizziness and gait disturbance.  She reports multiple falls in the last year.  She had an MRI of brain, which showed -chronic small vessel ischemia. She has had neurology evaluation for gait disturbance and was referred to PT.  She states that she has not been seen much improvement in her gait.  She was given gabapentin and later Lyrica for neuropathy, but she had drowsiness with both.  She was given Cymbalta, but she has not been taking it currently.  Of note, she has history of uncontrolled type II DM and has not been able to tolerate most of the non-insulin medicines except glipizide. She does not check her blood glucose at home, but denies skipping any meals.  She takes glipizide 10 mg BID for DM, but has stopped taking metformin.  He agreed to start taking Metformin 500 mg QD. Her HbA1C was 9.6 in 05/24. She denies polyuria or polydipsia. She did not tolerate Januvia, Jardiance and Trulicity in the past. She did not tolerate statin in the past and has tried taking Nexletol, sent by lipid clinic.  She complains of chronic fatigue and also reports having neuropathic pain in her legs and feet, which have been chronic.  She was offered treatment for DM neuropathy in the past, but she refused it.  She agrees to  start Duloxetine for now.  Past Medical History:  Diagnosis Date   (HFpEF) heart failure with preserved ejection fraction (HCC)    Abdominal hernia 02/26/2012   unrepaired   Adenocarcinoma of breast (HCC) 1997   right / chemo + tamoxifen x 5 years    Anemia    Anxiety    Aortic atherosclerosis (HCC)    Arthritis    Blood transfusion 1980's   Breast cancer (HCC)    Cancer (HCC)    Phreesia 08/18/2020   Complication of anesthesia    has a hard time waking up; can't lay flat   Degenerative disc disease, lumbar    pressing on L3 and L4   Diabetes mellitus (HCC) 1989   Type 2 NIDDM; "cancer treatment gave me diabetes"   Diabetes mellitus without complication (HCC)    Phreesia 08/18/2020   Diverticulosis    DJD (degenerative joint disease) of lumbar spine    Dysrhythmia    "irregular"   Exertional dyspnea    Gastroparesis    GERD (gastroesophageal reflux disease)    mann   Heart murmur    "think I outgrew it"   Hepatic steatosis    History of kidney stones    History of stomach ulcers    Hx: UTI (urinary tract infection)    Hyperlipidemia    Hypersensitivity    in tongue  Hypertension    IBS (irritable bowel syndrome)    Insomnia    Internal hemorrhoids    Kidney cysts    RT KIDNEY   Kidney stones 2009   s/p lithotripsy   Mild CAD    Mild valvular heart disease    Nephrolithiasis 04/13/2014   Obesity    PFO (patent foramen ovale)    PONV (postoperative nausea and vomiting)    Pulmonary nodule    Shortness of breath    unable to lie flat   Ventral hernia     Past Surgical History:  Procedure Laterality Date   ABDOMINAL HYSTERECTOMY  2002   APPENDECTOMY     BACK SURGERY     Can't take any medical procedure in right arm   BREAST BIOPSY     right   BREAST SURGERY N/A    Phreesia 04/22/2020   CATARACT EXTRACTION W/ INTRAOCULAR LENS  IMPLANT, BILATERAL  2009-2010   CESAREAN SECTION  1973; 1978; 1981   CESAREAN SECTION N/A    Phreesia 04/22/2020    COLONOSCOPY WITH PROPOFOL N/A 10/29/2016   Procedure: COLONOSCOPY WITH PROPOFOL;  Surgeon: West Bali, MD;  Location: [facility];  Service: Endoscopy;  Laterality: N/A;  10:00 am   CYSTOSCOPY Left 04/13/2014   Procedure: CYSTOSCOPY FLEXIBLE;  Surgeon: Crist Fat, MD;  Location: [facility];  Service: Urology;  Laterality: Left;  with STENT   ESOPHAGOGASTRODUODENOSCOPY (EGD) WITH PROPOFOL N/A 10/29/2016   Procedure: ESOPHAGOGASTRODUODENOSCOPY (EGD) WITH PROPOFOL;  Surgeon: West Bali, MD;  Location: [facility];  Service: Endoscopy;  Laterality: N/A;   EYE SURGERY     implant and screws put in the right lower jaw  11/2008   dental surgery   kidney blockage  ?1990's   " major surgery ;put kidney on pump for awhile"   LITHOTRIPSY     "several times"   LUMBAR FUSION  08/2010   MASTECTOMY  1997   right   NEPHROLITHOTOMY Left 04/13/2014   Procedure: LEFT PERCUTANEOUS NEPHROLITHOTOMY WITH SURGEON ACCESS;  Surgeon: Crist Fat, MD;  Location: [facility];  Service: Urology;  Laterality: Left;   OVARIAN CYST SURGERY     PARATHYROIDECTOMY  02/26/2012   Procedure: PARATHYROIDECTOMY;  Surgeon: Darletta Moll, MD;  Location: [facility];  Service: ENT;;   SAVORY DILATION N/A 10/29/2016   Procedure: SAVORY DILATION;  Surgeon: West Bali, MD;  Location: [facility];  Service: Endoscopy;  Laterality: N/A;   SPINE SURGERY  2011   urological surgery for blocked ureter secondary to kidney stone      Family History  Problem Relation Age of Onset   Birth defects Mother    Heart attack Mother    Cancer Brother        Esophageal; smoker; deceased 24s   Colon cancer Brother    Diabetes Daughter    Leukemia Paternal Aunt        deceased 24s   Pancreatic cancer Paternal Aunt        deceased 53   Cancer Paternal Aunt        GI cancer; deceased 4s   Cancer Cousin        kidney cancer; deceased 67; pat first cousin; daughter of aunt w/ leukemia   Cancer Cousin        colon cancer @ 64; pat first  cousin; son of aunt with GI cancer   Anesthesia problems Neg Hx     Social History   Socioeconomic  History   Marital status: Married    Spouse name: Not on file   Number of children: 3   Years of education: Not on file   Highest education level: Not on file  Occupational History   Occupation: retired in 2011  Tobacco Use   Smoking status: Never   Smokeless tobacco: Never  Vaping Use   Vaping status: Never Used  Substance and Sexual Activity   Alcohol use: Yes    Alcohol/week: 0.0 standard drinks of alcohol    Comment: X2 DRINKS PER YEAR   Drug use: No   Sexual activity: Not Currently    Birth control/protection: Surgical  Other Topics Concern   Not on file  Social History Narrative   Not on file   Social Determinants of Health   Financial Resource Strain: Low Risk  (12/15/2022)   Overall Financial Resource Strain (CARDIA)    Difficulty of Paying Living Expenses: Not hard at all  Food Insecurity: No Food Insecurity (12/15/2022)   Hunger Vital Sign    Worried About Running Out of Food in the Last Year: Never true    Ran Out of Food in the Last Year: Never true  Transportation Needs: No Transportation Needs (12/15/2022)   PRAPARE - Administrator, Civil Service (Medical): No    Lack of Transportation (Non-Medical): No  Physical Activity: Unknown (12/15/2022)   Exercise Vital Sign    Days of Exercise per Week: 0 days    Minutes of Exercise per Session: Not on file  Stress: Stress Concern Present (12/15/2022)   [employer] of Occupational Health - Occupational Stress Questionnaire    Feeling of Stress : To some extent  Social Connections: Socially Integrated (12/15/2022)   Social Connection and Isolation Panel [NHANES]    Frequency of Communication with Friends and Family: More than three times a week    Frequency of Social Gatherings with Friends and Family: Once a week    Attends Religious Services: More than 4 times per year    Active Member of [employer]  or Organizations: Yes    Attends Banker Meetings: 1 to 4 times per year    Marital Status: Married  Catering manager Violence: Not on file    Outpatient Medications Prior to Visit  Medication Sig Dispense Refill   acetaminophen (TYLENOL) 325 MG tablet Take 325 mg by mouth every 6 (six) hours as needed for mild pain.     Blood Glucose Monitoring Suppl (BLOOD GLUCOSE SYSTEM PAK) KIT Use as directed to monitor FSBS 1x daily. Dx: E11.9 1 each 1   Cholecalciferol (VITAMIN D3 PO) Take 1 tablet by mouth daily.     CINNAMON PO Take 1 capsule by mouth. Takes sometimes     diclofenac Sodium (VOLTAREN) 1 % GEL Apply 4 g topically 4 (four) times daily. 4 g 1   famotidine (PEPCID) 20 MG tablet Take 1 tablet (20 mg total) by mouth 2 (two) times daily. 60 tablet 5   ferrous sulfate 325 (65 FE) MG EC tablet Take 1 tablet (325 mg total) by mouth daily with breakfast. 90 tablet 3   furosemide (LASIX) 40 MG tablet Take 1 tablet (40 mg total) by mouth daily as needed (FOR SWELLING). 90 tablet 0   glucose blood (ACCU-CHEK GUIDE) test strip 1 each by Other route as needed for other. Use as instructed to check blood sugar daily. 100 each 3   LANCETS SUPER THIN 28G MISC Use to check blood sugar once daily.  100 each 3   metoprolol tartrate (LOPRESSOR) 25 MG tablet Take 1 tablet (25 mg total) by mouth 2 (two) times daily. 180 tablet 3   Multiple Vitamin (MULTIVITAMIN) tablet Take 1 tablet by mouth daily.     potassium chloride (KLOR-CON) 10 MEQ tablet Take 1 tablet (10 mEq total) by mouth daily. 90 tablet 1   triamcinolone (KENALOG) 0.025 % ointment Apply 1 Application topically 2 (two) times daily. 30 g 0   amLODipine (NORVASC) 5 MG tablet TAKE ONE TABLET BY MOUTH ONCE DAILY. 90 tablet 0   DULoxetine (CYMBALTA) 30 MG capsule Take 1 capsule (30 mg total) by mouth daily. 30 capsule 2   glipiZIDE (GLUCOTROL) 10 MG tablet TAKE (1) TABLET BY MOUTH TWICE DAILY BEFORE A MEAL 90 tablet 0   metFORMIN  (GLUCOPHAGE) 500 MG tablet Take 2 tablets (1,000 mg total) by mouth 2 (two) times daily with a meal. (Patient taking differently: Take 500 mg by mouth daily.) 290 tablet 0   No facility-administered medications prior to visit.    Allergies  Allergen Reactions   Demerol [Meperidine] Other (See Comments)    Lost consciousness   Bentyl [Dicyclomine Hcl] Swelling   Invokana [Canagliflozin] Hives   Metformin And Related Diarrhea   Protonix [Pantoprazole Sodium] Swelling   Statins Other (See Comments)    myalgia   Ace Inhibitors Cough    ROS Review of Systems  Constitutional:  Positive for fatigue. Negative for chills and fever.  HENT:  Negative for congestion, sinus pressure, sinus pain and sore throat.   Eyes:  Negative for pain and discharge.  Respiratory:  Negative for cough and shortness of breath.   Cardiovascular:  Negative for chest pain and palpitations.  Gastrointestinal:  Negative for abdominal pain, diarrhea, nausea and vomiting.  Endocrine: Negative for polydipsia and polyuria.  Genitourinary:  Negative for dysuria and hematuria.  Musculoskeletal:  Positive for arthralgias and back pain. Negative for neck pain and neck stiffness.  Skin:  Negative for rash.  Neurological:  Positive for weakness (B/l LE) and numbness (B/l hands).       Numbness and tingling in b/l LE  Psychiatric/Behavioral:  Negative for agitation and behavioral problems.       Objective:    Physical Exam Vitals reviewed.  Constitutional:      General: She is not in acute distress.    Appearance: She is not diaphoretic.  HENT:     Head: Normocephalic and atraumatic.     Nose: Nose normal.     Mouth/Throat:     Mouth: Mucous membranes are moist.  Eyes:     General: No scleral icterus.    Extraocular Movements: Extraocular movements intact.  Cardiovascular:     Rate and Rhythm: Normal rate and regular rhythm.     Heart sounds: Normal heart sounds. No murmur heard. Pulmonary:     Breath  sounds: Normal breath sounds. No wheezing or rales.  Musculoskeletal:     Cervical back: Neck supple. No tenderness.     Right lower leg: No edema.     Left lower leg: No edema.  Skin:    General: Skin is warm.     Findings: No rash.  Neurological:     General: No focal deficit present.     Mental Status: She is alert and oriented to person, place, and time.     Sensory: Sensory deficit (B/l feet) present.     Motor: Weakness (B/l LE - 4/5) present.  Psychiatric:  Mood and Affect: Mood normal.        Behavior: Behavior normal.     BP 133/75 (BP Location: Left Arm, Patient Position: Sitting, Cuff Size: Normal)   Pulse 95   Ht 5\' 6"  (1.676 m)   Wt 176 lb (79.8 kg)   SpO2 98%   BMI 28.41 kg/m  Wt Readings from Last 3 Encounters:  05/02/23 176 lb (79.8 kg)  02/18/23 171 lb (77.6 kg)  12/19/22 176 lb 3.2 oz (79.9 kg)    Lab Results  Component Value Date   TSH 0.844 09/17/2022   Lab Results  Component Value Date   WBC 8.6 02/11/2023   HGB 11.3 (L) 02/11/2023   HCT 35.1 (L) 02/11/2023   MCV 80.0 02/11/2023   PLT 241 02/11/2023   Lab Results  Component Value Date   NA 133 (L) 02/11/2023   K 3.1 (L) 02/11/2023   CO2 26 02/11/2023   GLUCOSE 401 (H) 02/11/2023   BUN 10 02/11/2023   CREATININE 1.14 (H) 02/11/2023   BILITOT 0.6 02/11/2023   ALKPHOS 57 02/11/2023   AST 22 02/11/2023   ALT 22 02/11/2023   PROT 7.4 02/11/2023   ALBUMIN 3.6 02/11/2023   CALCIUM 8.9 02/11/2023   ANIONGAP 10 02/11/2023   EGFR 53 (L) 12/19/2022   GFR 55.96 (L) 02/23/2020   Lab Results  Component Value Date   CHOL 201 (H) 09/17/2022   Lab Results  Component Value Date   HDL 82 09/17/2022   Lab Results  Component Value Date   LDLCALC 101 (H) 09/17/2022   Lab Results  Component Value Date   TRIG 104 09/17/2022   Lab Results  Component Value Date   CHOLHDL 2.5 09/17/2022   Lab Results  Component Value Date   HGBA1C 9.6 (H) 12/19/2022      Assessment & Plan:    Problem List Items Addressed This Visit       Cardiovascular and Mediastinum   Essential hypertension    BP Readings from Last 1 Encounters:  05/02/23 133/75   Well controlled with amlodipine and metoprolol Counseled for compliance with the medications Advised DASH diet and moderate exercise/walking, at least 150 mins/week      Relevant Medications   amLODipine (NORVASC) 5 MG tablet     Endocrine   Uncontrolled type 2 diabetes mellitus with hyperglycemia, without long-term current use of insulin (HCC) - Primary    Lab Results  Component Value Date   HGBA1C 9.6 (H) 12/19/2022   Uncontrolled On Glipizide 10 mg BID Added Metformin 500 XR mg QD as she had diarrhea with regular Metformin Did not tolerate Jardiance, Januvia and GLP-1 agonist in the past  She likely has mostly hyperglycemia, but due to her diet noncompliance, concern for hypoglycemia as well - she would benefit from CGM, prescribed Dexcom G7 Needs to follow diabetic diet - refer to nutritional counseling as she reports noncompliance to diabetic diet F/u CMP and lipid panel Diabetic eye exam: Advised to follow up with Ophthalmology for diabetic eye exam      Relevant Medications   glipiZIDE (GLUCOTROL) 10 MG tablet   metFORMIN (GLUCOPHAGE-XR) 500 MG 24 hr tablet   Continuous Glucose Receiver (DEXCOM G7 RECEIVER) DEVI   Continuous Glucose Sensor (DEXCOM G7 SENSOR) MISC   Other Relevant Orders   CMP14+EGFR   Hemoglobin A1c   Referral to Nutrition and Diabetes Services   Urine Microalbumin w/creat. ratio   Diabetic neuropathy (HCC)    Did not tolerate gabapentin or  Lyrica Unclear if her gait disturbance is due to diabetic neuropathy She has chronic burning pain and tingling in the UE and LE Had started Cymbalta 30 mg QD, needs to start taking it Followed by neurology Check CMP, Mg and B12 level      Relevant Medications   glipiZIDE (GLUCOTROL) 10 MG tablet   DULoxetine (CYMBALTA) 30 MG capsule    metFORMIN (GLUCOPHAGE-XR) 500 MG 24 hr tablet   Other Relevant Orders   Magnesium   B12     Musculoskeletal and Integument   Statin myopathy    Did not tolerate statin in the past        Other   Hypokalemia    Check CMP and Mg      Relevant Orders   Magnesium   Other Visit Diagnoses     Hypoglycemia       Relevant Medications   Continuous Glucose Receiver (DEXCOM G7 RECEIVER) DEVI   Continuous Glucose Sensor (DEXCOM G7 SENSOR) MISC       Meds ordered this encounter  Medications   glipiZIDE (GLUCOTROL) 10 MG tablet    Sig: Take 1 tablet (10 mg total) by mouth 2 (two) times daily before a meal.    Dispense:  180 tablet    Refill:  3   DULoxetine (CYMBALTA) 30 MG capsule    Sig: Take 1 capsule (30 mg total) by mouth daily.    Dispense:  30 capsule    Refill:  3   metFORMIN (GLUCOPHAGE-XR) 500 MG 24 hr tablet    Sig: Take 1 tablet (500 mg total) by mouth daily with breakfast.    Dispense:  30 tablet    Refill:  3   Continuous Glucose Receiver (DEXCOM G7 RECEIVER) DEVI    Sig: Use it to check blood glucose 3 times before meals, at bedtime and as needed.    Dispense:  1 each    Refill:  0   Continuous Glucose Sensor (DEXCOM G7 SENSOR) MISC    Sig: Use it to check blood glucose 3 times before meals, at bedtime and as needed.    Dispense:  3 each    Refill:  5   amLODipine (NORVASC) 5 MG tablet    Sig: Take 1 tablet (5 mg total) by mouth daily.    Dispense:  90 tablet    Refill:  3    Follow-up: Return in about 4 months (around 09/01/2023) for Annual physical.    Anabel Halon, MD

## 2023-05-02 NOTE — Assessment & Plan Note (Signed)
Did not tolerate statin in the past

## 2023-05-02 NOTE — Assessment & Plan Note (Signed)
Check CMP and Mg

## 2023-05-02 NOTE — Assessment & Plan Note (Signed)
BP Readings from Last 1 Encounters:  05/02/23 133/75   Well controlled with amlodipine and metoprolol Counseled for compliance with the medications Advised DASH diet and moderate exercise/walking, at least 150 mins/week

## 2023-05-02 NOTE — Assessment & Plan Note (Addendum)
Lab Results  Component Value Date   HGBA1C 9.6 (H) 12/19/2022   Uncontrolled On Glipizide 10 mg BID Added Metformin 500 XR mg QD as she had diarrhea with regular Metformin Did not tolerate Jardiance, Januvia and GLP-1 agonist in the past  She likely has mostly hyperglycemia, but due to her diet noncompliance, concern for hypoglycemia as well - she would benefit from CGM, prescribed Dexcom G7 Needs to follow diabetic diet - refer to nutritional counseling as she reports noncompliance to diabetic diet F/u CMP and lipid panel Diabetic eye exam: Advised to follow up with Ophthalmology for diabetic eye exam

## 2023-05-03 LAB — CMP14+EGFR
ALT: 19 [IU]/L (ref 0–32)
AST: 20 [IU]/L (ref 0–40)
Albumin: 4 g/dL (ref 3.8–4.8)
Alkaline Phosphatase: 71 [IU]/L (ref 44–121)
BUN/Creatinine Ratio: 8 — ABNORMAL LOW (ref 12–28)
BUN: 9 mg/dL (ref 8–27)
Bilirubin Total: 0.4 mg/dL (ref 0.0–1.2)
CO2: 25 mmol/L (ref 20–29)
Calcium: 9.5 mg/dL (ref 8.7–10.3)
Chloride: 99 mmol/L (ref 96–106)
Creatinine, Ser: 1.15 mg/dL — ABNORMAL HIGH (ref 0.57–1.00)
Globulin, Total: 2.9 g/dL (ref 1.5–4.5)
Glucose: 321 mg/dL — ABNORMAL HIGH (ref 70–99)
Potassium: 3.6 mmol/L (ref 3.5–5.2)
Sodium: 141 mmol/L (ref 134–144)
Total Protein: 6.9 g/dL (ref 6.0–8.5)
eGFR: 49 mL/min/{1.73_m2} — ABNORMAL LOW

## 2023-05-03 LAB — HEMOGLOBIN A1C
Est. average glucose Bld gHb Est-mCnc: 272 mg/dL
Hgb A1c MFr Bld: 11.1 % — ABNORMAL HIGH (ref 4.8–5.6)

## 2023-05-03 LAB — MAGNESIUM: Magnesium: 1.7 mg/dL (ref 1.6–2.3)

## 2023-05-03 LAB — VITAMIN B12: Vitamin B-12: 467 pg/mL (ref 232–1245)

## 2023-05-05 ENCOUNTER — Telehealth: Payer: Self-pay | Admitting: Internal Medicine

## 2023-05-05 NOTE — Telephone Encounter (Signed)
Dexcom denied 05/04/23

## 2023-05-14 ENCOUNTER — Encounter: Payer: Medicare Other | Attending: Internal Medicine | Admitting: Nutrition

## 2023-05-14 VITALS — Ht 66.5 in | Wt 174.0 lb

## 2023-05-14 DIAGNOSIS — E1165 Type 2 diabetes mellitus with hyperglycemia: Secondary | ICD-10-CM | POA: Insufficient documentation

## 2023-05-14 DIAGNOSIS — E782 Mixed hyperlipidemia: Secondary | ICD-10-CM | POA: Diagnosis present

## 2023-05-14 DIAGNOSIS — I10 Essential (primary) hypertension: Secondary | ICD-10-CM | POA: Diagnosis present

## 2023-05-14 NOTE — Progress Notes (Signed)
Medical Nutrition Therapy  Appointment Start time:  0800  Appointment End time:  0900  Primary concerns today: Uncontrolled Type 2  Referral diagnosis: E11.8 Preferred learning style: No preference  Learning readiness: Ready    NUTRITION ASSESSMENT  77 yr old bfemale referred for uncontrolled Type 2 DM. PMH HTN, Type 2 DM, GERD, IBS, Neuropathy, Hyperlipidemia,. Complains of chronic fatigue. Fatigue could be due to elevated blood sugars uncontrolled. PCP Dr. Patterson Hammersmith. A1C 11.1%. Currently has a dexcom. Is on Metformin and Glipizide. Has been having some falls. Going to PT. H/o breast cancer. She is willing to work on LIfestyle Medicine with focusing on eating whole plant base foods and increasing what exercises she can do with her neuropathy.   Lab Results  Component Value Date   HGBA1C 11.1 (H) 05/02/2023    Anthropometrics  Wt Readings from Last 3 Encounters:  05/14/23 174 lb (78.9 kg)  05/02/23 176 lb (79.8 kg)  02/18/23 171 lb (77.6 kg)   Ht Readings from Last 3 Encounters:  05/14/23 5' 6.5" (1.689 m)  05/02/23 5\' 6"  (1.676 m)  02/18/23 5' 6.5" (1.689 m)   Body mass index is 27.66 kg/m. @BMIFA @ Facility age limit for growth %iles is 20 years. Facility age limit for growth %iles is 20 years.    Clinical Medical Hx: See chart Medications: Metformin, Glipizide Labs:  Lab Results  Component Value Date   HGBA1C 11.1 (H) 05/02/2023      Latest Ref Rng & Units 05/02/2023   10:58 AM 02/11/2023    1:29 PM 12/19/2022    3:47 PM  CMP  Glucose 70 - 99 mg/dL 161  096  045   BUN 8 - 27 mg/dL 9  10  13    Creatinine 0.57 - 1.00 mg/dL 4.09  8.11  9.14   Sodium 134 - 144 mmol/L 141  133  141   Potassium 3.5 - 5.2 mmol/L 3.6  3.1  3.8   Chloride 96 - 106 mmol/L 99  97  99   CO2 20 - 29 mmol/L 25  26  21    Calcium 8.7 - 10.3 mg/dL 9.5  8.9  9.6   Total Protein 6.0 - 8.5 g/dL 6.9  7.4  7.4   Total Bilirubin 0.0 - 1.2 mg/dL 0.4  0.6  0.6   Alkaline Phos 44 - 121 IU/L  71  57  91   AST 0 - 40 IU/L 20  22  19    ALT 0 - 32 IU/L 19  22  20     Lipid Panel     Component Value Date/Time   CHOL 201 (H) 09/17/2022 1135   TRIG 104 09/17/2022 1135   HDL 82 09/17/2022 1135   CHOLHDL 2.5 09/17/2022 1135   CHOLHDL 3.4 12/15/2019 1041   VLDL 27 07/03/2016 1203   LDLCALC 101 (H) 09/17/2022 1135   LDLCALC 110 (H) 12/15/2019 1041   LABVLDL 18 09/17/2022 1135      Notable Signs/Symptoms: Increased thirst, frequent urination, craves sweets  Lifestyle & Dietary Hx Married. Doesn't cook much. Eats out often.  Estimated daily fluid intake: 30 oz Supplements:  Sleep: varies Stress / self-care:  Current average weekly physical activity: ADL  24-Hr Dietary Recall Eats 2-3 meals per day. Skips breakfast some  Estimated Energy Needs Calories: 1200 Carbohydrate: 135g Protein: 90g Fat: 33g   NUTRITION DIAGNOSIS  NB-1.1 Food and nutrition-related knowledge deficit As related to Diabetes Type 2.  As evidenced by A1C 11.1%.   NUTRITION INTERVENTION  Nutrition education (E-1) on the following topics:  Nutrition and Diabetes education provided on My Plate, CHO counting, meal planning, portion sizes, timing of meals, avoiding snacks between meals unless having a low blood sugar, target ranges for A1C and blood sugars, signs/symptoms and treatment of hyper/hypoglycemia, monitoring blood sugars, taking medications as prescribed, benefits of exercising 30 minutes per day and prevention of complications of DM. Lifestyle Medicine  - Whole Food, Plant Predominant Nutrition is highly recommended: Eat Plenty of vegetables, Mushrooms, fruits, Legumes, Whole Grains, Nuts, seeds in lieu of processed meats, processed snacks/pastries red meat, poultry, eggs.    -It is better to avoid simple carbohydrates including: Cakes, Sweet Desserts, Ice Cream, Soda (diet and regular), Sweet Tea, Candies, Chips, Cookies, Store Bought Juices, Alcohol in Excess of  1-2 drinks a day,  Lemonade,  Artificial Sweeteners, Doughnuts, Coffee Creamers, "Sugar-free" Products, etc, etc.  This is not a complete list.....  Exercise: If you are able: 30 -60 minutes a day ,4 days a week, or 150 minutes a week.  The longer the better.  Combine stretch, strength, and aerobic activities.  If you were told in the past that you have high risk for cardiovascular diseases, you may seek evaluation by your heart doctor prior to initiating moderate to intense exercise programs.   Handouts Provided Include  Lifestyle Medicine handouts  Learning Style & Readiness for Change Teaching method utilized: Visual & Auditory  Demonstrated degree of understanding via: Teach Back  Barriers to learning/adherence to lifestyle change: None  Goals Established by Pt Eats three meals per day Take Medications as prescribed. Increase whole plant based foods of fruits, vegetables and whole grains. Drink only water Walk 15-30 minutes a day Get A1C down to 7% Take Metformin after meals.   MONITORING & EVALUATION Dietary intake, weekly physical activity, and BS  in 2 weeks.  Next Steps  Patient is to work on eating 3 balanced meals per day.Marland Kitchen

## 2023-05-22 ENCOUNTER — Other Ambulatory Visit: Payer: Self-pay | Admitting: Internal Medicine

## 2023-05-22 DIAGNOSIS — E1165 Type 2 diabetes mellitus with hyperglycemia: Secondary | ICD-10-CM

## 2023-05-27 ENCOUNTER — Encounter: Payer: Self-pay | Admitting: Nutrition

## 2023-05-27 ENCOUNTER — Encounter: Payer: Medicare Other | Attending: Internal Medicine | Admitting: Nutrition

## 2023-05-27 VITALS — Wt 172.0 lb

## 2023-05-27 DIAGNOSIS — E782 Mixed hyperlipidemia: Secondary | ICD-10-CM | POA: Insufficient documentation

## 2023-05-27 DIAGNOSIS — E1165 Type 2 diabetes mellitus with hyperglycemia: Secondary | ICD-10-CM | POA: Diagnosis present

## 2023-05-27 DIAGNOSIS — I10 Essential (primary) hypertension: Secondary | ICD-10-CM | POA: Diagnosis present

## 2023-05-27 DIAGNOSIS — Z713 Dietary counseling and surveillance: Secondary | ICD-10-CM | POA: Insufficient documentation

## 2023-05-27 NOTE — Progress Notes (Signed)
Medical Nutrition Therapy [phone]  Primary concerns today: Uncontrolled Type 2 Follow up Referral diagnosis: E11.8 Preferred learning style: No preference  Learning readiness: Ready    NUTRITION ASSESSMENT  She wore the LIbre 3 for the last 2 weeks. BS have been 20% in range and 60% above range and 20% high range. PCP Dr. Allena Brown. She has been very fearful of needles and has resisted the need to take insulin to help improve her  blood sugars. AVG BS have been 250-350's. No hypoglycemia. Medication is Metformin and Glipizide.  Has cut out tea and cut down on soda to 1 per day. Only ate chocolate 1 time.  However BS are remaining to stay elevated due to insuffient insulin.  She is now willing to work with trying to start insulin to help improve her BS's.   Has had some falls. Unsure if blood sugar related or ?Marland Kitchen Has blurry vision. Stays thirsty and frequent urination.  She is willing to make appt with PCP To discuss insulin and getting her BS under better control.  Lab Results  Component Value Date   HGBA1C 11.1 (H) 05/02/2023    Anthropometrics  Wt Readings from Last 3 Encounters:  05/14/23 174 lb (78.9 kg)  05/02/23 176 lb (79.8 kg)  02/18/23 171 lb (77.6 kg)   Ht Readings from Last 3 Encounters:  05/14/23 5' 6.5" (1.689 m)  05/02/23 5\' 6"  (1.676 m)  02/18/23 5' 6.5" (1.689 m)   There is no height or weight on file to calculate BMI. @BMIFA @ Facility age limit for growth %iles is 20 years. Facility age limit for growth %iles is 20 years.    Clinical Medical Hx: See chart Medications: Metformin, Glipizide Labs:  Lab Results  Component Value Date   HGBA1C 11.1 (H) 05/02/2023      Latest Ref Rng & Units 05/02/2023   10:58 AM 02/11/2023    1:29 PM 12/19/2022    3:47 PM  CMP  Glucose 70 - 99 mg/dL 578  469  629   BUN 8 - 27 mg/dL 9  10  13    Creatinine 0.57 - 1.00 mg/dL 5.28  4.13  2.44   Sodium 134 - 144 mmol/L 141  133  141   Potassium 3.5 - 5.2 mmol/L 3.6   3.1  3.8   Chloride 96 - 106 mmol/L 99  97  99   CO2 20 - 29 mmol/L 25  26  21    Calcium 8.7 - 10.3 mg/dL 9.5  8.9  9.6   Total Protein 6.0 - 8.5 g/dL 6.9  7.4  7.4   Total Bilirubin 0.0 - 1.2 mg/dL 0.4  0.6  0.6   Alkaline Phos 44 - 121 IU/L 71  57  91   AST 0 - 40 IU/L 20  22  19    ALT 0 - 32 IU/L 19  22  20     Lipid Panel     Component Value Date/Time   CHOL 201 (H) 09/17/2022 1135   TRIG 104 09/17/2022 1135   HDL 82 09/17/2022 1135   CHOLHDL 2.5 09/17/2022 1135   CHOLHDL 3.4 12/15/2019 1041   VLDL 27 07/03/2016 1203   LDLCALC 101 (H) 09/17/2022 1135   LDLCALC 110 (H) 12/15/2019 1041   LABVLDL 18 09/17/2022 1135      Notable Signs/Symptoms: Increased thirst, frequent urination, craves sweets  Lifestyle & Dietary Hx Married. Doesn't cook much. Eats out often.  Estimated daily fluid intake: 30 oz Supplements:  Sleep: varies Stress / self-care:  Current average weekly physical activity: ADL  24-Hr Dietary Recall Eats 2-3 meals per day. Skips breakfast some  Estimated Energy Needs Calories: 1200 Carbohydrate: 135g Protein: 90g Fat: 33g   NUTRITION DIAGNOSIS  NB-1.1 Food and nutrition-related knowledge deficit As related to Diabetes Type 2.  As evidenced by A1C 11.1%.   NUTRITION INTERVENTION  Nutrition education (E-1) on the following topics:  Nutrition and Diabetes education provided on My Plate, CHO counting, meal planning, portion sizes, timing of meals, avoiding snacks between meals unless having a low blood sugar, target ranges for A1C and blood sugars, signs/symptoms and treatment of hyper/hypoglycemia, monitoring blood sugars, taking medications as prescribed, benefits of exercising 30 minutes per day and prevention of complications of DM. Lifestyle Medicine  - Whole Food, Plant Predominant Nutrition is highly recommended: Eat Plenty of vegetables, Mushrooms, fruits, Legumes, Whole Grains, Nuts, seeds in lieu of processed meats, processed snacks/pastries  red meat, poultry, eggs.    -It is better to avoid simple carbohydrates including: Cakes, Sweet Desserts, Ice Cream, Soda (diet and regular), Sweet Tea, Candies, Chips, Cookies, Store Bought Juices, Alcohol in Excess of  1-2 drinks a day, Lemonade,  Artificial Sweeteners, Doughnuts, Coffee Creamers, "Sugar-free" Products, etc, etc.  This is not a complete list.....  Exercise: If you are able: 30 -60 minutes a day ,4 days a week, or 150 minutes a week.  The longer the better.  Combine stretch, strength, and aerobic activities.  If you were told in the past that you have high risk for cardiovascular diseases, you may seek evaluation by your heart doctor prior to initiating moderate to intense exercise programs.   Handouts Provided Include  Lifestyle Medicine handouts  Learning Style & Readiness for Change Teaching method utilized: Visual & Auditory  Demonstrated degree of understanding via: Teach Back  Barriers to learning/adherence to lifestyle change: None  Goals Established by Pt Contact your MD to make appt for getting started on insulin to improve your blood sugars.  Eats three meals per day Take Medications as prescribed. Increase whole plant based foods of fruits, vegetables and whole grains. Drink only water Walk 15-30 minutes a day Get A1C down to 7% Take Metformin after meals.   MONITORING & EVALUATION Dietary intake, weekly physical activity, and BS  in 2 weeks.  Next Steps  Patient is to work on eating 3 balanced meals per day.Marland Kitchen

## 2023-05-27 NOTE — Patient Instructions (Signed)
Eats three meals per day Take Medications as prescribed. Increase whole plant based foods of fruits, vegetables and whole grains. Drink only water Walk 15-30 minutes a day Get A1C down to 7% Take Metformin after meals.

## 2023-05-29 ENCOUNTER — Telehealth: Payer: Self-pay | Admitting: Internal Medicine

## 2023-05-29 NOTE — Telephone Encounter (Signed)
Victorino Dike [facility] called in on patent behalf   Looks as if patient is diabetic and not an an statin. Wants to see if provider will re consider added statin to patient medication  Call back info [phone]

## 2023-05-29 NOTE — Telephone Encounter (Signed)
Call back number is not correct

## 2023-06-04 ENCOUNTER — Encounter: Payer: Self-pay | Admitting: Nutrition

## 2023-06-04 NOTE — Patient Instructions (Signed)
Contact your MD to make appt for getting started on insulin to improve your blood sugars.   Eats three meals per day Take Medications as prescribed. Increase whole plant based foods of fruits, vegetables and whole grains. Drink only water Walk 15-30 minutes a day Get A1C down to 7% Take Metformin after meals

## 2023-06-05 ENCOUNTER — Ambulatory Visit: Payer: Medicare Other | Admitting: Neurology

## 2023-06-05 ENCOUNTER — Encounter: Payer: Self-pay | Admitting: Neurology

## 2023-06-05 VITALS — BP 138/70 | Ht 66.0 in | Wt 177.0 lb

## 2023-06-05 DIAGNOSIS — R269 Unspecified abnormalities of gait and mobility: Secondary | ICD-10-CM

## 2023-06-05 DIAGNOSIS — Z7984 Long term (current) use of oral hypoglycemic drugs: Secondary | ICD-10-CM

## 2023-06-05 DIAGNOSIS — E1143 Type 2 diabetes mellitus with diabetic autonomic (poly)neuropathy: Secondary | ICD-10-CM

## 2023-06-05 DIAGNOSIS — E1142 Type 2 diabetes mellitus with diabetic polyneuropathy: Secondary | ICD-10-CM

## 2023-06-05 MED ORDER — DULOXETINE HCL 60 MG PO CPEP: 60.0000 mg | ORAL_CAPSULE | Freq: Every day | ORAL | 11 refills | Status: DC

## 2023-06-05 NOTE — Patient Instructions (Addendum)
Increase Duloxetine to 60 mg daily  Consider adding vitamin B complex, lidocaine cream at night and alpha lipoic acid Continue your other medications Continue to follow-up with nutritionist Contact me if the neuropathy is getting worse Follow-up in 6 months or sooner if worse.

## 2023-06-05 NOTE — Progress Notes (Signed)
[facility]  PATIENT: [name] DOB: 1946-09-13  REQUESTING CLINICIAN: Anabel Halon, MD HISTORY FROM: Patient and daughter  REASON FOR VISIT: Dizziness, multiple falls.    HISTORICAL  CHIEF COMPLAINT:  Chief Complaint  Patient presents with   Follow-up    Rm 12, f/u, finished therapy, no falls since, neuropathy still there and causing pain    INTERVAL HISTORY 06/05/2023 Patient presents today for follow-up, last visit was in March and since then she has completed PT, balance therapy.  She reports that PT was helpful.  She has not had any falls since then.  Her main complaint now is pain from her neuropathy.  She has uncontrolled diabetes, her most recent A1c is 11, and neuropathy is not improving.  She is on duloxetine 30 mg daily and gabapentin but stated that the gabapentin makes her very sleepy.  She has a nutritionist who is helping her with diet and is looking forward to set up care with endocrinologist for better control of her diabetes.    HISTORY OF PRESENT ILLNESS:  This is a 77 year old woman with past medical history of breast cancer, s/p chemotherapy, hypertension, diabetes, who is presenting with multiple falls. Patient reports since May of last year, she has a total of 6 falls resulting in torn ligament, and bruises. She denies any prodromes prior to the fall, no warning signs, no dizziness, no lightheadedness. During the last fall, she was talking to her husband then collapsed to the ground, again no warning signs.  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   OTHER MEDICAL CONDITIONS: History of breast cancer, Hypertension, Diabetes    REVIEW OF SYSTEMS: Full 14 system review of systems performed and  negative with exception of: As noted in the HPI   ALLERGIES: Allergies  Allergen Reactions   Demerol [Meperidine] Other (See Comments)    Lost consciousness   Bentyl [Dicyclomine Hcl] Swelling   Invokana [Canagliflozin] Hives   Metformin And Related Diarrhea   Protonix [Pantoprazole Sodium] Swelling   Statins Other (See Comments)    myalgia   Ace Inhibitors Cough    HOME MEDICATIONS: Outpatient Medications Prior to Visit  Medication Sig Dispense Refill   acetaminophen (TYLENOL) 325 MG tablet Take 325 mg by mouth every 6 (six) hours as needed for mild pain.     amLODipine (NORVASC) 5 MG tablet Take 1 tablet (5 mg total) by mouth daily. 90 tablet 3   Blood Glucose Monitoring Suppl (BLOOD GLUCOSE SYSTEM PAK) KIT Use as directed to monitor FSBS 1x daily. Dx: E11.9 1 each 1   Cholecalciferol (VITAMIN D3 PO) Take 1 tablet by mouth daily.     CINNAMON PO Take 1 capsule by mouth. Takes sometimes     Continuous Glucose Receiver (DEXCOM G7 RECEIVER) DEVI Use it to check blood glucose 3 times before meals, at bedtime and as needed. 1 each 0   Continuous Glucose Sensor (DEXCOM G7 SENSOR) MISC Use it to check blood glucose 3 times before meals, at bedtime and as needed. 3 each 5   diclofenac Sodium (VOLTAREN) 1 % GEL Apply 4 g topically 4 (four) times daily. 4 g 1   famotidine (PEPCID) 20  MG tablet Take 1 tablet (20 mg total) by mouth 2 (two) times daily. 60 tablet 5   ferrous sulfate 325 (65 FE) MG EC tablet Take 1 tablet (325 mg total) by mouth daily with breakfast. 90 tablet 3   furosemide (LASIX) 40 MG tablet Take 1 tablet (40 mg total) by mouth daily as needed (FOR SWELLING). 90 tablet 0   glipiZIDE (GLUCOTROL) 10 MG tablet Take 1 tablet (10 mg total) by mouth 2 (two) times daily before a meal. 180 tablet 3   glucose blood (ACCU-CHEK GUIDE) test strip 1 each by Other route as needed for other. Use as instructed to check blood sugar daily. 100 each 3   LANCETS SUPER THIN 28G MISC Use to check  blood sugar once daily. 100 each 3   metFORMIN (GLUCOPHAGE-XR) 500 MG 24 hr tablet TAKE ONE TABLET BY MOUTH ONCE DAILYWITH BREAKFAST. 90 tablet 0   metoprolol tartrate (LOPRESSOR) 25 MG tablet Take 1 tablet (25 mg total) by mouth 2 (two) times daily. 180 tablet 3   Multiple Vitamin (MULTIVITAMIN) tablet Take 1 tablet by mouth daily.     potassium chloride (KLOR-CON) 10 MEQ tablet Take 1 tablet (10 mEq total) by mouth daily. 90 tablet 1   triamcinolone (KENALOG) 0.025 % ointment Apply 1 Application topically 2 (two) times daily. 30 g 0   DULoxetine (CYMBALTA) 30 MG capsule Take 1 capsule (30 mg total) by mouth daily. 30 capsule 3   No facility-administered medications prior to visit.    PAST MEDICAL HISTORY: Past Medical History:  Diagnosis Date   (HFpEF) heart failure with preserved ejection fraction (HCC)    Abdominal hernia 02/26/2012   unrepaired   Adenocarcinoma of breast (HCC) 1997   right / chemo + tamoxifen x 5 years    Anemia    Anxiety    Aortic atherosclerosis (HCC)    Arthritis    Blood transfusion 1980's   Breast cancer (HCC)    Cancer (HCC)    Phreesia 08/18/2020   Complication of anesthesia    has a hard time waking up; can't lay flat   Degenerative disc disease, lumbar    pressing on L3 and L4   Diabetes mellitus (HCC) 1989   Type 2 NIDDM; "cancer treatment gave me diabetes"   Diabetes mellitus without complication (HCC)    [employer]reesia 08/18/2020   Diverticulosis    DJD (degenerative joint disease) of lumbar spine    Dysrhythmia    "irregular"   Exertional dyspnea    Gastroparesis    GERD (gastroesophageal reflux disease)    mann   Heart murmur    "think I outgrew it"   Hepatic steatosis    History of kidney stones    History of stomach ulcers    Hx: UTI (urinary tract infection)    Hyperlipidemia    Hypersensitivity    in tongue   Hypertension    IBS (irritable bowel syndrome)    Insomnia    Internal hemorrhoids    Kidney cysts    RT KIDNEY    Kidney stones 2009   s/p lithotripsy   Mild CAD    Mild valvular heart disease    Nephrolithiasis 04/13/2014   Obesity    PFO (patent foramen ovale)    PONV (postoperative nausea and vomiting)    Pulmonary nodule    Shortness of breath    unable to lie flat   Ventral hernia     PAST SURGICAL HISTORY: Past Surgical History:  Procedure  Laterality Date   ABDOMINAL HYSTERECTOMY  2002   APPENDECTOMY     BACK SURGERY     Can't take any medical procedure in right arm   BREAST BIOPSY     right   BREAST SURGERY N/A    Phreesia 04/22/2020   CATARACT EXTRACTION W/ INTRAOCULAR LENS  IMPLANT, BILATERAL  2009-2010   CESAREAN SECTION  1973; 1978; 1981   CESAREAN SECTION N/A    [employer]reesia 04/22/2020   COLONOSCOPY WITH PROPOFOL N/A 10/29/2016   Procedure: COLONOSCOPY WITH PROPOFOL;  Surgeon: West Bali, MD;  Location: [facility];  Service: Endoscopy;  Laterality: N/A;  10:00 am   CYSTOSCOPY Left 04/13/2014   Procedure: CYSTOSCOPY FLEXIBLE;  Surgeon: Crist Fat, MD;  Location: [facility];  Service: Urology;  Laterality: Left;  with STENT   ESOPHAGOGASTRODUODENOSCOPY (EGD) WITH PROPOFOL N/A 10/29/2016   Procedure: ESOPHAGOGASTRODUODENOSCOPY (EGD) WITH PROPOFOL;  Surgeon: West Bali, MD;  Location: [facility];  Service: Endoscopy;  Laterality: N/A;   EYE SURGERY     implant and screws put in the right lower jaw  11/2008   dental surgery   kidney blockage  ?1990's   " major surgery ;put kidney on pump for awhile"   LITHOTRIPSY     "several times"   LUMBAR FUSION  08/2010   MASTECTOMY  1997   right   NEPHROLITHOTOMY Left 04/13/2014   Procedure: LEFT PERCUTANEOUS NEPHROLITHOTOMY WITH SURGEON ACCESS;  Surgeon: Crist Fat, MD;  Location: [facility];  Service: Urology;  Laterality: Left;   OVARIAN CYST SURGERY     PARATHYROIDECTOMY  02/26/2012   Procedure: PARATHYROIDECTOMY;  Surgeon: Darletta Moll, MD;  [facility];  Service: ENT;;   SAVORY DILATION N/A 10/29/2016   Procedure:  SAVORY DILATION;  Surgeon: West Bali, MD;  Location: [facility];  Service: Endoscopy;  Laterality: N/A;   SPINE SURGERY  2011   urological surgery for blocked ureter secondary to kidney stone      FAMILY HISTORY: Family History  Problem Relation Age of Onset   Birth defects Mother    Heart attack Mother    Cancer Brother        Esophageal; smoker; deceased 44s   Colon cancer Brother    Diabetes Daughter    Leukemia Paternal Aunt        deceased 17s   Pancreatic cancer Paternal Aunt        deceased 10   Cancer Paternal Aunt        GI cancer; deceased 32s   Cancer Cousin        kidney cancer; deceased 46; pat first cousin; daughter of aunt w/ leukemia   Cancer Cousin        colon cancer @ 33; pat first cousin; son of aunt with GI cancer   Anesthesia problems Neg Hx     SOCIAL HISTORY: Social History   Socioeconomic History   Marital status: Married    Spouse name: Not on file   Number of children: 3   Years of education: Not on file   Highest education level: Not on file  Occupational History   Occupation: retired in 2011  Tobacco Use   Smoking status: Never   Smokeless tobacco: Never  Vaping Use   Vaping status: Never Used  Substance and Sexual Activity   Alcohol use: Yes    Alcohol/week: 0.0 standard drinks of alcohol    Comment: X2 DRINKS PER YEAR   Drug use:  No   Sexual activity: Not Currently    Birth control/protection: Surgical  Other Topics Concern   Not on file  Social History Narrative   Right handed   Caffeine-2 cups daily occasionally   Social Determinants of Health   Financial Resource Strain: Low Risk  (12/15/2022)   Overall Financial Resource Strain (CARDIA)    Difficulty of Paying Living Expenses: Not hard at all  Food Insecurity: No Food Insecurity (12/15/2022)   Hunger Vital Sign    Worried About Running Out of Food in the Last Year: Never true    Ran Out of Food in the Last Year: Never true  Transportation Needs: No  Transportation Needs (12/15/2022)   PRAPARE - Administrator, Civil Service (Medical): No    Lack of Transportation (Non-Medical): No  Physical Activity: Unknown (12/15/2022)   Exercise Vital Sign    Days of Exercise per Week: 0 days    Minutes of Exercise per Session: Not on file  Stress: Stress Concern Present (12/15/2022)   [employer] of Occupational Health - Occupational Stress Questionnaire    Feeling of Stress : To some extent  Social Connections: Socially Integrated (12/15/2022)   Social Connection and Isolation Panel [NHANES]    Frequency of Communication with Friends and Family: More than three times a week    Frequency of Social Gatherings with Friends and Family: Once a week    Attends Religious Services: More than 4 times per year    Active Member of [employer] or Organizations: Yes    Attends Banker Meetings: 1 to 4 times per year    Marital Status: Married  Catering manager Violence: Not on file    PHYSICAL EXAM  GENERAL EXAM/CONSTITUTIONAL: Vitals:  Vitals:   06/05/23 0944  BP: 138/70  Weight: 177 lb (80.3 kg)  Height: 5\' 6"  (1.676 m)   Body mass index is 28.57 kg/m. Wt Readings from Last 3 Encounters:  06/05/23 177 lb (80.3 kg)  05/27/23 172 lb (78 kg)  05/14/23 174 lb (78.9 kg)   Patient is in no distress; well developed, nourished and groomed; neck is supple  MUSCULOSKELETAL: Gait, strength, tone, movements noted in Neurologic exam below  NEUROLOGIC: MENTAL STATUS:      No data to display         awake, alert, oriented to person, place and time recent and remote memory intact normal attention and concentration language fluent, comprehension intact, naming intact fund of knowledge appropriate  CRANIAL NERVE:  2nd, 3rd, 4th, 6th - Visual fields full to confrontation, extraocular muscles intact, no nystagmus 5th - facial sensation symmetric 7th - facial strength symmetric 8th - hearing intact 9th - palate elevates  symmetrically, uvula midline 11th - shoulder shrug symmetric 12th - tongue protrusion midline  MOTOR:  normal bulk and tone, full strength in the BUE, BLE  SENSORY:  normal and symmetric to light touch and pinprick, decrease vibration in the bilateral feet left greater than right. Reports numbness and pinprick sensation to both hands   COORDINATION:  finger-nose-finger, fine finger movements normal  GAIT/STATION:  Antalgic gait, unable to tandem     DIAGNOSTIC DATA (LABS, IMAGING, TESTING) - I reviewed patient records, labs, notes, testing and imaging myself where available.  Lab Results  Component Value Date   WBC 8.6 02/11/2023   HGB 11.3 (L) 02/11/2023   HCT 35.1 (L) 02/11/2023   MCV 80.0 02/11/2023   PLT 241 02/11/2023      Component Value Date/Time  NA 141 05/02/2023 1058   K 3.6 05/02/2023 1058   CL 99 05/02/2023 1058   CO2 25 05/02/2023 1058   GLUCOSE 321 (H) 05/02/2023 1058   GLUCOSE 401 (H) 02/11/2023 1329   GLUCOSE 219 (H) 08/26/2016 1251   BUN 9 05/02/2023 1058   CREATININE 1.15 (H) 05/02/2023 1058   CREATININE 1.07 (H) 10/25/2020 1224   CALCIUM 9.5 05/02/2023 1058   CALCIUM 9.9 02/26/2012 1551   PROT 6.9 05/02/2023 1058   ALBUMIN 4.0 05/02/2023 1058   AST 20 05/02/2023 1058   ALT 19 05/02/2023 1058   ALKPHOS 71 05/02/2023 1058   BILITOT 0.4 05/02/2023 1058   GFRNONAA 50 (L) 02/11/2023 1329   GFRNONAA 68 09/07/2013 1104   GFRAA >60 11/20/2018 1229   GFRAA 78 09/07/2013 1104   Lab Results  Component Value Date   CHOL 201 (H) 09/17/2022   HDL 82 09/17/2022   LDLCALC 101 (H) 09/17/2022   TRIG 104 09/17/2022   CHOLHDL 2.5 09/17/2022   Lab Results  Component Value Date   HGBA1C 11.1 (H) 05/02/2023   Lab Results  Component Value Date   VITAMINB12 467 05/02/2023   Lab Results  Component Value Date   TSH 0.844 09/17/2022    MRI Brain 2022 No acute or reversible finding. No specific cause of the presenting symptoms is identified. Mild to  moderate chronic small-vessel ischemic changes of the pons and cerebral hemispheric white matter. Old minor cortical infarction at the left posterior frontal vertex region.  CT Head 10/24/2022 Periventricular white matter changes consistent with chronic small vessel ischemia. Small chronic left frontal CVA. No acute intracranial process identified.    ASSESSMENT AND PLAN  77 y.o. year old female with history of breast cancer, s/p chemotherapy, hypertension, diabetes, who is presenting for follow up.  Since last visit, she has completed physical therapy, reports that it was beneficial and has not had any falls since then.   Her main complaint now is a uncontrolled diabetes and her neuropathy.  She is having pain and tingling up to the knees and also affecting both hands.  She is on duloxetine 30 mg, will increase it to 60 mg daily.  Patient also reports that she is on gabapentin but that medication makes her sleepy.  I advised patient to try over-the-counter medications including lidocaine cream at night, vitamin B complex, alpha lipoic acid.  She does have a nutritionist and is looking forward to set up care with endocrinologist for better management of her diabetes.  Currently she is not on insulin but I did advise patient that when she start insulin, there is a chance that her neuropathy will get worse due to insulin neuritis.  Advised her at that time to contact me, we might increase the duloxetine or add pregabalin or Emla cream.  Continue to follow with PCP and return in 6 months or sooner if worse.   1. Diabetic polyneuropathy associated with type 2 diabetes mellitus (HCC)   2. Diabetic autonomic neuropathy associated with type 2 diabetes mellitus (HCC)   3. Gait abnormality     Patient Instructions  Increase Duloxetine to 60 mg daily  Consider adding vitamin B complex, lidocaine cream at night and alpha lipoic acid Continue your other medications Continue to follow-up with  nutritionist Contact me if the neuropathy is getting worse Follow-up in 6 months or sooner if worse.  No orders of the defined types were placed in this encounter.   Meds ordered this encounter  Medications  DULoxetine (CYMBALTA) 60 MG capsule    Sig: Take 1 capsule (60 mg total) by mouth daily.    Dispense:  30 capsule    Refill:  11    Return in about 6 months (around 12/03/2023).    Windell Norfolk, MD 06/05/2023, 11:31 AM  [facility] [address], [address] [place], [place] [address] [phone]

## 2023-06-17 ENCOUNTER — Encounter: Payer: Self-pay | Admitting: Internal Medicine

## 2023-06-17 ENCOUNTER — Ambulatory Visit: Payer: Medicare Other | Admitting: Internal Medicine

## 2023-06-17 VITALS — BP 110/71 | HR 83 | Ht 66.0 in | Wt 171.0 lb

## 2023-06-17 DIAGNOSIS — E1165 Type 2 diabetes mellitus with hyperglycemia: Secondary | ICD-10-CM

## 2023-06-17 DIAGNOSIS — E1143 Type 2 diabetes mellitus with diabetic autonomic (poly)neuropathy: Secondary | ICD-10-CM

## 2023-06-17 DIAGNOSIS — I10 Essential (primary) hypertension: Secondary | ICD-10-CM | POA: Diagnosis not present

## 2023-06-17 DIAGNOSIS — E782 Mixed hyperlipidemia: Secondary | ICD-10-CM | POA: Diagnosis not present

## 2023-06-17 DIAGNOSIS — Z7984 Long term (current) use of oral hypoglycemic drugs: Secondary | ICD-10-CM

## 2023-06-17 MED ORDER — BLOOD GLUCOSE MONITOR KIT
PACK | 0 refills | Status: DC
Start: 2023-06-17 — End: 2024-04-24

## 2023-06-17 MED ORDER — LANTUS SOLOSTAR 100 UNIT/ML ~~LOC~~ SOPN: 10.0000 [IU] | PEN_INJECTOR | Freq: Every day | SUBCUTANEOUS | 2 refills | Status: DC

## 2023-06-17 MED ORDER — INSULIN PEN NEEDLE 32G X 4 MM MISC
3 refills | Status: DC
Start: 2023-06-17 — End: 2024-04-24

## 2023-06-17 MED ORDER — FREESTYLE LIBRE 3 SENSOR MISC
5 refills | Status: DC
Start: 2023-06-17 — End: 2024-02-06

## 2023-06-17 NOTE — Assessment & Plan Note (Signed)
Follows up with lipid clinic On Nexletol, but did not take it Has statin induced myopathy

## 2023-06-17 NOTE — Progress Notes (Signed)
Established Patient Office Visit  Subjective:  Patient ID: [name], female    DOB: 1946-09-02  Age: 77 y.o. MRN: [id number]  CC:  Chief Complaint  Patient presents with   Diabetes    Follow up     HPI [name] is a 77 y.o. female with past medical history of HTN, DM, breast ca s/p right mastectomy and chemotherapy, HLD, DDD of lumbar spine and GERD who presents for f/u of her chronic medical conditions.  Her BP was wnl today.  She has been taking amlodipine and metoprolol now.  She denies any headache, chest pain, dyspnea or palpitations.    She has chronic dizziness and gait disturbance.  She reports multiple falls in the last year.  She had an MRI of brain, which showed -chronic small vessel ischemia. She has had neurology evaluation for gait disturbance and was told to increase dose of Cymbalta to 60 mg QD.  She was given gabapentin and later Lyrica for neuropathy in the past, but she had drowsiness with both. Of note, she has history of uncontrolled type II DM and has not been able to tolerate most of the non-insulin medicines except glipizide.  She takes glipizide 10 mg BID and metformin ER 500 mg QD for DM. Her HbA1C was 11.1 in 08/24. She denies polyuria or polydipsia. She did not tolerate Januvia, Jardiance and Trulicity in the past.  She saw nutritionist and had freestyle libre sample. BS have been 20% in range and 60% above range and 20% high range. She reports that she understands the importance of glycemic control now and agrees to take insulin.  She did not tolerate statin in the past and has tried taking Nexletol, sent by lipid clinic.  She complains of chronic fatigue and also reports having neuropathic pain in her legs and feet, which have been chronic. She agrees to take Duloxetine for now.  Past Medical History:  Diagnosis Date   (HFpEF) heart failure with preserved ejection fraction (HCC)    Abdominal hernia 02/26/2012   unrepaired   Adenocarcinoma of  breast (HCC) 1997   right / chemo + tamoxifen x 5 years    Anemia    Anxiety    Aortic atherosclerosis (HCC)    Arthritis    Blood transfusion 1980's   Breast cancer (HCC)    Cancer (HCC)    Phreesia 08/18/2020   Complication of anesthesia    has a hard time waking up; can't lay flat   Degenerative disc disease, lumbar    pressing on L3 and L4   Diabetes mellitus (HCC) 1989   Type 2 NIDDM; "cancer treatment gave me diabetes"   Diabetes mellitus without complication (HCC)    Phreesia 08/18/2020   Diverticulosis    DJD (degenerative joint disease) of lumbar spine    Dysrhythmia    "irregular"   Exertional dyspnea    Gastroparesis    GERD (gastroesophageal reflux disease)    mann   Heart murmur    "think I outgrew it"   Hepatic steatosis    History of kidney stones    History of stomach ulcers    Hx: UTI (urinary tract infection)    Hyperlipidemia    Hypersensitivity    in tongue   Hypertension    IBS (irritable bowel syndrome)    Insomnia    Internal hemorrhoids    Kidney cysts    RT KIDNEY   Kidney stones 2009   s/p lithotripsy   Mild  CAD    Mild valvular heart disease    Nephrolithiasis 04/13/2014   Obesity    PFO (patent foramen ovale)    PONV (postoperative nausea and vomiting)    Pulmonary nodule    Shortness of breath    unable to lie flat   Ventral hernia     Past Surgical History:  Procedure Laterality Date   ABDOMINAL HYSTERECTOMY  2002   APPENDECTOMY     BACK SURGERY     Can't take any medical procedure in right arm   BREAST BIOPSY     right   BREAST SURGERY N/A    Phreesia 04/22/2020   CATARACT EXTRACTION W/ INTRAOCULAR LENS  IMPLANT, BILATERAL  2009-2010   CESAREAN SECTION  1973; 1978; 1981   CESAREAN SECTION N/A    Phreesia 04/22/2020   COLONOSCOPY WITH PROPOFOL N/A 10/29/2016   Procedure: COLONOSCOPY WITH PROPOFOL;  Surgeon: West Bali, MD;  Location: [facility];  Service: Endoscopy;  Laterality: N/A;  10:00 am   CYSTOSCOPY Left  04/13/2014   Procedure: CYSTOSCOPY FLEXIBLE;  Surgeon: Crist Fat, MD;  Location: [facility];  Service: Urology;  Laterality: Left;  with STENT   ESOPHAGOGASTRODUODENOSCOPY (EGD) WITH PROPOFOL N/A 10/29/2016   Procedure: ESOPHAGOGASTRODUODENOSCOPY (EGD) WITH PROPOFOL;  Surgeon: West Bali, MD;  Location: [facility];  Service: Endoscopy;  Laterality: N/A;   EYE SURGERY     implant and screws put in the right lower jaw  11/2008   dental surgery   kidney blockage  ?1990's   " major surgery ;put kidney on pump for awhile"   LITHOTRIPSY     "several times"   LUMBAR FUSION  08/2010   MASTECTOMY  1997   right   NEPHROLITHOTOMY Left 04/13/2014   Procedure: LEFT PERCUTANEOUS NEPHROLITHOTOMY WITH SURGEON ACCESS;  Surgeon: Crist Fat, MD;  Location: [facility];  Service: Urology;  Laterality: Left;   OVARIAN CYST SURGERY     PARATHYROIDECTOMY  02/26/2012   Procedure: PARATHYROIDECTOMY;  Surgeon: Darletta Moll, MD;  Location: [facility];  Service: ENT;;   SAVORY DILATION N/A 10/29/2016   Procedure: SAVORY DILATION;  Surgeon: West Bali, MD;  Location: [facility];  Service: Endoscopy;  Laterality: N/A;   SPINE SURGERY  2011   urological surgery for blocked ureter secondary to kidney stone      Family History  Problem Relation Age of Onset   Birth defects Mother    Heart attack Mother    Cancer Brother        Esophageal; smoker; deceased 70s   Colon cancer Brother    Diabetes Daughter    Leukemia Paternal Aunt        deceased 46s   Pancreatic cancer Paternal Aunt        deceased 74   Cancer Paternal Aunt        GI cancer; deceased 40s   Cancer Cousin        kidney cancer; deceased 72; pat first cousin; daughter of aunt w/ leukemia   Cancer Cousin        colon cancer @ 12; pat first cousin; son of aunt with GI cancer   Anesthesia problems Neg Hx     Social History   Socioeconomic History   Marital status: Married    Spouse name: Not on file   Number of children: 3   Years of  education: Not on file   Highest education level: Not on file  Occupational History  Occupation: retired in 2011  Tobacco Use   Smoking status: Never   Smokeless tobacco: Never  Vaping Use   Vaping status: Never Used  Substance and Sexual Activity   Alcohol use: Yes    Alcohol/week: 0.0 standard drinks of alcohol    Comment: X2 DRINKS PER YEAR   Drug use: No   Sexual activity: Not Currently    Birth control/protection: Surgical  Other Topics Concern   Not on file  Social History Narrative   Right handed   Caffeine-2 cups daily occasionally   Social Determinants of Health   Financial Resource Strain: Low Risk  (12/15/2022)   Overall Financial Resource Strain (CARDIA)    Difficulty of Paying Living Expenses: Not hard at all  Food Insecurity: No Food Insecurity (12/15/2022)   Hunger Vital Sign    Worried About Running Out of Food in the Last Year: Never true    Ran Out of Food in the Last Year: Never true  Transportation Needs: No Transportation Needs (12/15/2022)   PRAPARE - Administrator, Civil Service (Medical): No    Lack of Transportation (Non-Medical): No  Physical Activity: Unknown (12/15/2022)   Exercise Vital Sign    Days of Exercise per Week: 0 days    Minutes of Exercise per Session: Not on file  Stress: Stress Concern Present (12/15/2022)   [employer] of Occupational Health - Occupational Stress Questionnaire    Feeling of Stress : To some extent  Social Connections: Socially Integrated (12/15/2022)   Social Connection and Isolation Panel [NHANES]    Frequency of Communication with Friends and Family: More than three times a week    Frequency of Social Gatherings with Friends and Family: Once a week    Attends Religious Services: More than 4 times per year    Active Member of [employer] or Organizations: Yes    Attends Banker Meetings: 1 to 4 times per year    Marital Status: Married  Catering manager Violence: Not on file     Outpatient Medications Prior to Visit  Medication Sig Dispense Refill   acetaminophen (TYLENOL) 325 MG tablet Take 325 mg by mouth every 6 (six) hours as needed for mild pain.     amLODipine (NORVASC) 5 MG tablet Take 1 tablet (5 mg total) by mouth daily. 90 tablet 3   Blood Glucose Monitoring Suppl (BLOOD GLUCOSE SYSTEM PAK) KIT Use as directed to monitor FSBS 1x daily. Dx: E11.9 1 each 1   Cholecalciferol (VITAMIN D3 PO) Take 1 tablet by mouth daily.     CINNAMON PO Take 1 capsule by mouth. Takes sometimes     diclofenac Sodium (VOLTAREN) 1 % GEL Apply 4 g topically 4 (four) times daily. 4 g 1   DULoxetine (CYMBALTA) 60 MG capsule Take 1 capsule (60 mg total) by mouth daily. 30 capsule 11   famotidine (PEPCID) 20 MG tablet Take 1 tablet (20 mg total) by mouth 2 (two) times daily. 60 tablet 5   ferrous sulfate 325 (65 FE) MG EC tablet Take 1 tablet (325 mg total) by mouth daily with breakfast. 90 tablet 3   furosemide (LASIX) 40 MG tablet Take 1 tablet (40 mg total) by mouth daily as needed (FOR SWELLING). 90 tablet 0   glipiZIDE (GLUCOTROL) 10 MG tablet Take 1 tablet (10 mg total) by mouth 2 (two) times daily before a meal. 180 tablet 3   glucose blood (ACCU-CHEK GUIDE) test strip 1 each by Other route as needed  for other. Use as instructed to check blood sugar daily. 100 each 3   LANCETS SUPER THIN 28G MISC Use to check blood sugar once daily. 100 each 3   metFORMIN (GLUCOPHAGE-XR) 500 MG 24 hr tablet TAKE ONE TABLET BY MOUTH ONCE DAILYWITH BREAKFAST. 90 tablet 0   metoprolol tartrate (LOPRESSOR) 25 MG tablet Take 1 tablet (25 mg total) by mouth 2 (two) times daily. 180 tablet 3   Multiple Vitamin (MULTIVITAMIN) tablet Take 1 tablet by mouth daily.     potassium chloride (KLOR-CON) 10 MEQ tablet Take 1 tablet (10 mEq total) by mouth daily. 90 tablet 1   triamcinolone (KENALOG) 0.025 % ointment Apply 1 Application topically 2 (two) times daily. 30 g 0   Continuous Glucose Receiver (DEXCOM  G7 RECEIVER) DEVI Use it to check blood glucose 3 times before meals, at bedtime and as needed. 1 each 0   Continuous Glucose Sensor (DEXCOM G7 SENSOR) MISC Use it to check blood glucose 3 times before meals, at bedtime and as needed. 3 each 5   No facility-administered medications prior to visit.    Allergies  Allergen Reactions   Demerol [Meperidine] Other (See Comments)    Lost consciousness   Bentyl [Dicyclomine Hcl] Swelling   Invokana [Canagliflozin] Hives   Metformin And Related Diarrhea   Protonix [Pantoprazole Sodium] Swelling   Statins Other (See Comments)    myalgia   Ace Inhibitors Cough    ROS Review of Systems  Constitutional:  Positive for fatigue. Negative for chills and fever.  HENT:  Negative for congestion, sinus pressure, sinus pain and sore throat.   Eyes:  Negative for pain and discharge.  Respiratory:  Negative for cough and shortness of breath.   Cardiovascular:  Negative for chest pain and palpitations.  Gastrointestinal:  Negative for abdominal pain, diarrhea, nausea and vomiting.  Endocrine: Negative for polydipsia and polyuria.  Genitourinary:  Negative for dysuria and hematuria.  Musculoskeletal:  Positive for arthralgias and back pain. Negative for neck pain and neck stiffness.  Skin:  Negative for rash.  Neurological:  Positive for weakness (B/l LE) and numbness (B/l hands).       Numbness and tingling in b/l LE  Psychiatric/Behavioral:  Negative for agitation and behavioral problems.       Objective:    Physical Exam Vitals reviewed.  Constitutional:      General: She is not in acute distress.    Appearance: She is not diaphoretic.  HENT:     Head: Normocephalic and atraumatic.     Nose: Nose normal.     Mouth/Throat:     Mouth: Mucous membranes are moist.  Eyes:     General: No scleral icterus.    Extraocular Movements: Extraocular movements intact.  Cardiovascular:     Rate and Rhythm: Normal rate and regular rhythm.     Heart  sounds: Normal heart sounds. No murmur heard. Pulmonary:     Breath sounds: Normal breath sounds. No wheezing or rales.  Musculoskeletal:     Cervical back: Neck supple. No tenderness.     Right lower leg: No edema.     Left lower leg: No edema.  Skin:    General: Skin is warm.     Findings: No rash.  Neurological:     General: No focal deficit present.     Mental Status: She is alert and oriented to person, place, and time.     Sensory: Sensory deficit (B/l feet) present.     Motor:  Weakness (B/l LE - 4/5) present.  Psychiatric:        Mood and Affect: Mood normal.        Behavior: Behavior normal.     BP 110/71 (BP Location: Left Arm, Patient Position: Sitting, Cuff Size: Normal)   Pulse 83   Ht 5\' 6"  (1.676 m)   Wt 171 lb (77.6 kg)   SpO2 97%   BMI 27.60 kg/m  Wt Readings from Last 3 Encounters:  06/17/23 171 lb (77.6 kg)  06/05/23 177 lb (80.3 kg)  05/27/23 172 lb (78 kg)    Lab Results  Component Value Date   TSH 0.844 09/17/2022   Lab Results  Component Value Date   WBC 8.6 02/11/2023   HGB 11.3 (L) 02/11/2023   HCT 35.1 (L) 02/11/2023   MCV 80.0 02/11/2023   PLT 241 02/11/2023   Lab Results  Component Value Date   NA 141 05/02/2023   K 3.6 05/02/2023   CO2 25 05/02/2023   GLUCOSE 321 (H) 05/02/2023   BUN 9 05/02/2023   CREATININE 1.15 (H) 05/02/2023   BILITOT 0.4 05/02/2023   ALKPHOS 71 05/02/2023   AST 20 05/02/2023   ALT 19 05/02/2023   PROT 6.9 05/02/2023   ALBUMIN 4.0 05/02/2023   CALCIUM 9.5 05/02/2023   ANIONGAP 10 02/11/2023   EGFR 49 (L) 05/02/2023   GFR 55.96 (L) 02/23/2020   Lab Results  Component Value Date   CHOL 201 (H) 09/17/2022   Lab Results  Component Value Date   HDL 82 09/17/2022   Lab Results  Component Value Date   LDLCALC 101 (H) 09/17/2022   Lab Results  Component Value Date   TRIG 104 09/17/2022   Lab Results  Component Value Date   CHOLHDL 2.5 09/17/2022   Lab Results  Component Value Date   HGBA1C  11.1 (H) 05/02/2023      Assessment & Plan:   Problem List Items Addressed This Visit       Cardiovascular and Mediastinum   Essential hypertension    BP Readings from Last 1 Encounters:  06/17/23 110/71   Well controlled with amlodipine and metoprolol Counseled for compliance with the medications Advised DASH diet and moderate exercise/walking, at least 150 mins/week        Endocrine   Uncontrolled type 2 diabetes mellitus with hyperglycemia, without long-term current use of insulin (HCC) - Primary    Lab Results  Component Value Date   HGBA1C 11.1 (H) 05/02/2023   Uncontrolled On Glipizide 10 mg BID and Metformin 500 XR mg QD as she had diarrhea with regular Metformin Added Lantus 10 U at bedtime as she is insulin naive, would be cautious - she denies to use ISS Did not tolerate Jardiance, Januvia and GLP-1 agonist in the past  She likely has mostly hyperglycemia, but due to her diet noncompliance, concern for hypoglycemia as well - she would benefit from CGM, prescribed freestyle libre 3 Needs to follow diabetic diet - refer to nutritional counseling as she reports noncompliance to diabetic diet F/u CMP and lipid panel Diabetic eye exam: Advised to follow up with Ophthalmology for diabetic eye exam      Relevant Medications   insulin glargine (LANTUS SOLOSTAR) 100 UNIT/ML Solostar Pen   Continuous Glucose Sensor (FREESTYLE LIBRE 3 SENSOR) MISC   blood glucose meter kit and supplies KIT   Insulin Pen Needle 32G X 4 MM MISC   Other Relevant Orders   CMP14+EGFR   Hemoglobin A1c  Diabetic neuropathy (HCC)    Did not tolerate gabapentin or Lyrica Her gait disturbance is likely due to diabetic neuropathy She has chronic burning pain and tingling in the UE and LE Had started Cymbalta 30 mg QD, Neurology increased dose to 60 mg QD Followed by neurology      Relevant Medications   insulin glargine (LANTUS SOLOSTAR) 100 UNIT/ML Solostar Pen     Other    Hyperlipidemia    Follows up with lipid clinic On Nexletol, but did not take it Has statin induced myopathy      Relevant Orders   Lipid Profile     Meds ordered this encounter  Medications   insulin glargine (LANTUS SOLOSTAR) 100 UNIT/ML Solostar Pen    Sig: Inject 10 Units into the skin at bedtime.    Dispense:  15 mL    Refill:  2   Continuous Glucose Sensor (FREESTYLE LIBRE 3 SENSOR) MISC    Sig: Use it to check blood glucose as instructed.    Dispense:  2 each    Refill:  5   blood glucose meter kit and supplies KIT    Sig: Dispense based on patient and insurance preference. Use up to four times daily as directed.    Dispense:  1 each    Refill:  0    Order Specific Question:   Number of strips    Answer:   100    Order Specific Question:   Number of lancets    Answer:   100   Insulin Pen Needle 32G X 4 MM MISC    Sig: Use as directed for insulin administration.    Dispense:  100 each    Refill:  3    Follow-up: After 2 months.   Anabel Halon, MD

## 2023-06-17 NOTE — Assessment & Plan Note (Signed)
Did not tolerate gabapentin or Lyrica Her gait disturbance is likely due to diabetic neuropathy She has chronic burning pain and tingling in the UE and LE Had started Cymbalta 30 mg QD, Neurology increased dose to 60 mg QD Followed by neurology

## 2023-06-17 NOTE — Assessment & Plan Note (Signed)
BP Readings from Last 1 Encounters:  06/17/23 110/71   Well controlled with amlodipine and metoprolol Counseled for compliance with the medications Advised DASH diet and moderate exercise/walking, at least 150 mins/week

## 2023-06-17 NOTE — Assessment & Plan Note (Addendum)
Lab Results  Component Value Date   HGBA1C 11.1 (H) 05/02/2023   Uncontrolled On Glipizide 10 mg BID and Metformin 500 XR mg QD as she had diarrhea with regular Metformin Added Lantus 10 U at bedtime as she is insulin naive, would be cautious - she denies to use ISS Did not tolerate Jardiance, Januvia and GLP-1 agonist in the past  She likely has mostly hyperglycemia, but due to her diet noncompliance, concern for hypoglycemia as well - she would benefit from CGM, prescribed freestyle libre 3 Needs to follow diabetic diet - refer to nutritional counseling as she reports noncompliance to diabetic diet F/u CMP and lipid panel Diabetic eye exam: Advised to follow up with Ophthalmology for diabetic eye exam

## 2023-06-17 NOTE — Patient Instructions (Addendum)
Please start taking Lantus 10 U at bedtime.  Please continue to take medications as prescribed.  Please continue to follow low carb diet and perform moderate exercise/walking at least 150 mins/week.  Please contact us if blood glucose is less than 70 or more than 300.  Please get fasting blood tests done before the next visit.

## 2023-06-26 ENCOUNTER — Encounter: Payer: Medicare Other | Attending: Internal Medicine | Admitting: Nutrition

## 2023-06-26 ENCOUNTER — Encounter: Payer: Self-pay | Admitting: Nutrition

## 2023-06-26 DIAGNOSIS — Z713 Dietary counseling and surveillance: Secondary | ICD-10-CM | POA: Diagnosis not present

## 2023-06-26 DIAGNOSIS — E782 Mixed hyperlipidemia: Secondary | ICD-10-CM | POA: Insufficient documentation

## 2023-06-26 DIAGNOSIS — I10 Essential (primary) hypertension: Secondary | ICD-10-CM | POA: Insufficient documentation

## 2023-06-26 DIAGNOSIS — E1165 Type 2 diabetes mellitus with hyperglycemia: Secondary | ICD-10-CM | POA: Diagnosis present

## 2023-06-26 NOTE — Progress Notes (Signed)
Medical Nutrition Therapy 1015    1045 Primary concerns today: Uncontrolled Type 2 Follow up Referral diagnosis: E11.8 Preferred learning style: No preference  Learning readiness: Ready    NUTRITION ASSESSMENT "I am on insulin now. I feel so much better. I have a lot of energy now. I'm enjoying doing things. I don't need my walker or cane anymore. My neuropathy is much better." Saw Dr. Allena Katz on 06-17-23 and was started on 10 units of Lantus at night. She is eating her three meals per day.  Feels a lot better. Has more energy. She notes her daughter told her that her skin looked a lot better and her walking was better. [name] is on.  Has been insulin for the last week. Sensor has 5 days of readings: TIR 51%, TAR 49%. BS are much better overall. No hypoglycemia episodes. No recent falls. Balance is better she notes since her BS are much better.  Lantus could be increased by 2 units to improve FBS and help lower overall blood sugars. Discussed progression of DM and adjusting insulin as needed.  Lab Results  Component Value Date   HGBA1C 11.1 (H) 05/02/2023    Anthropometrics  Wt Readings from Last 3 Encounters:  06/17/23 171 lb (77.6 kg)  06/05/23 177 lb (80.3 kg)  05/27/23 172 lb (78 kg)   Ht Readings from Last 3 Encounters:  06/17/23 5\' 6"  (1.676 m)  06/05/23 5\' 6"  (1.676 m)  05/14/23 5' 6.5" (1.689 m)   There is no height or weight on file to calculate BMI. @BMIFA @ Facility age limit for growth %iles is 20 years. Facility age limit for growth %iles is 20 years.    Clinical Medical Hx: See chart Medications: Metformin, Glipizide Labs:  Lab Results  Component Value Date   HGBA1C 11.1 (H) 05/02/2023      Latest Ref Rng & Units 05/02/2023   10:58 AM 02/11/2023    1:29 PM 12/19/2022    3:47 PM  CMP  Glucose 70 - 99 mg/dL 725  366  440   BUN 8 - 27 mg/dL 9  10  13    Creatinine 0.57 - 1.00 mg/dL 3.47  4.25  9.56   Sodium 134 - 144 mmol/L 141  133  141   Potassium 3.5 - 5.2  mmol/L 3.6  3.1  3.8   Chloride 96 - 106 mmol/L 99  97  99   CO2 20 - 29 mmol/L 25  26  21    Calcium 8.7 - 10.3 mg/dL 9.5  8.9  9.6   Total Protein 6.0 - 8.5 g/dL 6.9  7.4  7.4   Total Bilirubin 0.0 - 1.2 mg/dL 0.4  0.6  0.6   Alkaline Phos 44 - 121 IU/L 71  57  91   AST 0 - 40 IU/L 20  22  19    ALT 0 - 32 IU/L 19  22  20     Lipid Panel     Component Value Date/Time   CHOL 201 (H) 09/17/2022 1135   TRIG 104 09/17/2022 1135   HDL 82 09/17/2022 1135   CHOLHDL 2.5 09/17/2022 1135   CHOLHDL 3.4 12/15/2019 1041   VLDL 27 07/03/2016 1203   LDLCALC 101 (H) 09/17/2022 1135   LDLCALC 110 (H) 12/15/2019 1041   LABVLDL 18 09/17/2022 1135      Notable Signs/Symptoms: Increased thirst, frequent urination, craves sweets  Lifestyle & Dietary Hx Married. Doesn't cook much. Eats out often.  Estimated daily fluid intake: 30 oz Supplements:  Sleep: varies Stress / self-care:  Current average weekly physical activity: ADL  24-Hr Dietary Recall Eats 2-3 meals per day. Skips breakfast some  Estimated Energy Needs Calories: 1200 Carbohydrate: 135g Protein: 90g Fat: 33g   NUTRITION DIAGNOSIS  NB-1.1 Food and nutrition-related knowledge deficit As related to Diabetes Type 2.  As evidenced by A1C 11.1%.   NUTRITION INTERVENTION  Nutrition education (E-1) on the following topics:  Nutrition and Diabetes education provided on My Plate, CHO counting, meal planning, portion sizes, timing of meals, avoiding snacks between meals unless having a low blood sugar, target ranges for A1C and blood sugars, signs/symptoms and treatment of hyper/hypoglycemia, monitoring blood sugars, taking medications as prescribed, benefits of exercising 30 minutes per day and prevention of complications of DM. Lifestyle Medicine  - Whole Food, Plant Predominant Nutrition is highly recommended: Eat Plenty of vegetables, Mushrooms, fruits, Legumes, Whole Grains, Nuts, seeds in lieu of processed meats, processed  snacks/pastries red meat, poultry, eggs.    -It is better to avoid simple carbohydrates including: Cakes, Sweet Desserts, Ice Cream, Soda (diet and regular), Sweet Tea, Candies, Chips, Cookies, Store Bought Juices, Alcohol in Excess of  1-2 drinks a day, Lemonade,  Artificial Sweeteners, Doughnuts, Coffee Creamers, "Sugar-free" Products, etc, etc.  This is not a complete list.....  Exercise: If you are able: 30 -60 minutes a day ,4 days a week, or 150 minutes a week.  The longer the better.  Combine stretch, strength, and aerobic activities.  If you were told in the past that you have high risk for cardiovascular diseases, you may seek evaluation by your heart doctor prior to initiating moderate to intense exercise programs.   Handouts Provided Include  Lifestyle Medicine handouts  Learning Style & Readiness for Change Teaching method utilized: Visual & Auditory  Demonstrated degree of understanding via: Teach Back  Barriers to learning/adherence to lifestyle change: None  Goals Increase lantus to 12 units at night. Goal is to get am blood sugars less than 150 mg/dl. Eat three balanced meals per day Keep drinking water Let MD know about dizzy spells and keep checking BP Get A1C down to 7%   MONITORING & EVALUATION Dietary intake, weekly physical activity, and BS  in 3 months.  Next Steps  Patient is to work on eating 3 balanced meals per day.Marland Kitchen

## 2023-06-26 NOTE — Patient Instructions (Addendum)
Increase lantus to 12 units at night. Goal is to get am blood sugars less than 150 mg/dl. Eat three balanced meals per day Keep drinking water Let MD know about dizzy spells and keep checking BP Get A1C down to 7%

## 2023-07-14 ENCOUNTER — Other Ambulatory Visit: Payer: Self-pay | Admitting: Physician Assistant

## 2023-08-11 ENCOUNTER — Inpatient Hospital Stay: Payer: Medicare Other | Attending: Hematology

## 2023-08-11 ENCOUNTER — Other Ambulatory Visit: Payer: Self-pay

## 2023-08-11 DIAGNOSIS — D509 Iron deficiency anemia, unspecified: Secondary | ICD-10-CM | POA: Diagnosis present

## 2023-08-11 DIAGNOSIS — C50919 Malignant neoplasm of unspecified site of unspecified female breast: Secondary | ICD-10-CM

## 2023-08-11 LAB — IRON AND TIBC
Iron: 66 ug/dL (ref 28–170)
Saturation Ratios: 19 % (ref 10.4–31.8)
TIBC: 346 ug/dL (ref 250–450)
UIBC: 280 ug/dL

## 2023-08-11 LAB — CBC
HCT: 35.2 % — ABNORMAL LOW (ref 36.0–46.0)
Hemoglobin: 11.4 g/dL — ABNORMAL LOW (ref 12.0–15.0)
MCH: 26.1 pg (ref 26.0–34.0)
MCHC: 32.4 g/dL (ref 30.0–36.0)
MCV: 80.7 fL (ref 80.0–100.0)
Platelets: 260 10*3/uL (ref 150–400)
RBC: 4.36 MIL/uL (ref 3.87–5.11)
RDW: 14.6 % (ref 11.5–15.5)
WBC: 8.7 10*3/uL (ref 4.0–10.5)
nRBC: 0 % (ref 0.0–0.2)

## 2023-08-11 LAB — FERRITIN: Ferritin: 25 ng/mL (ref 11–307)

## 2023-08-20 ENCOUNTER — Inpatient Hospital Stay: Payer: Medicare Other

## 2023-08-27 ENCOUNTER — Other Ambulatory Visit: Payer: Self-pay | Admitting: Internal Medicine

## 2023-08-27 DIAGNOSIS — E1165 Type 2 diabetes mellitus with hyperglycemia: Secondary | ICD-10-CM

## 2023-08-28 ENCOUNTER — Inpatient Hospital Stay: Payer: Medicare Other | Attending: Physician Assistant | Admitting: Oncology

## 2023-08-28 NOTE — Progress Notes (Unsigned)
[facility] [address], [place] [address]   CLINIC:  Medical Oncology/Hematology  PCP:  Anabel Halon, MD [address] / [place] [address] [phone]   CHIEF COMPLIANT: Follow-up for stage IA (T1cN0M0) right breast cancer, ER+/PR+ (diagnosed in 1997)   BRIEF ONCOLOGIC HISTORY:   Oncology History  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 (HCC)  02/09/2008 Initial Diagnosis   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 (HCC)   07/06/2014 Genetic Testing   Testing was normal and did not reveal a mutation in these genes. The genes tested were ATM, BARD1, BRCA1, BRCA2, BRIP1, CDH1, CHEK2, MRE11A, MUTYH, NBN, NF1, PALB2, PTEN, RAD50, RAD51C, RAD51D, and TP53.    CANCER STAGING:  Cancer Staging  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 [facility]) Staging form: Breast, AJCC 6th Edition - Clinical: Stage I (T1c, N0, M0) - Signed by Ellouise Newer, PA-C on 05/21/2013   INTERVAL HISTORY:   [name], a 77 y.o. female, returns for routine follow-up of her history of right-sided breast cancer, which was diagnosed in 51. Vidhya was last seen on 02/18/2023 by Rojelio Brenner, PA.  In the interim, she denies any surgeries or hospitalizations.  She was evaluated by her PCP Dr. Allena Katz on 06/17/2023 for routine follow-up for chronic conditions.  She has chronic dizziness and gait disturbance.  She has had several falls this year.  She had an MRI of her brain which showed chronic small vessel ischemia.  She was referred to neurology and was instructed to increase dose of Cymbalta to 60 mg daily.  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  She completed physical therapy for her balance disturbance in June 2024.  She denies any symptoms of recurrence such as new lumps, bone pain, chest pain, dyspnea, or abdominal pain.  She has no new headaches, seizures,  or focal neurologic deficits.  No B symptoms such as fever, chills, night sweats, unintentional weight loss.  She has some ongoing aching in her right arm with intermittent lymphedema.   She denies any frank melena or hematochezia, but does have intermittent "dark blackish bowel movements."  She admits to fatigue, pica, dyspnea on exertion, and lightheadedness without syncope.  She was previously taking iron tablet 3 times weekly, but stopped about a month ago.  She continues to take vitamin D.  She reports 70% energy and 90% appetite.  She is maintaining stable weight at this time.  ASSESSMENT & PLAN:  1.  Stage Ia (T1cN0M0) right breast cancer, ER positive/PR positive: - Patient was diagnosed with right breast cancer in 1997. - Right breast cancer measuring 1.1 x 1.2 cm, grade 2, status post right mastectomy with regional axillary lymph node dissection on 10/17/1995; all 8 axillary lymph nodes were negative for metastatic disease. - She had adjuvant chemotherapy consisting of Adriamycin/Cytoxan x4 cycles. - Antiestrogen therapy with tamoxifen x5 years was completed in July 2002. - Last mammogram done on 02/11/2022 showed BI-RADS category 1 negative. - Most recent labs (02/11/2023): Baseline CBC and CMP.  LFTs are normal, and she has baseline mild CKD stage IIIa with GFR 50/creatinine 1.14. - Physical exam showed right mastectomy site within normal limits.  No masses or lymphadenopathy in left breast or bilateral axilla. - No symptoms concerning for recurrent cancer per patient report  - PLAN: Patient is medically eligible to discharge from clinic, but she prefers to follow  at [facility] for labs and annual mammograms.   - She is due for annual mammogram. - RTC around June/July 2025 (after annual labs and mammogram in 2025)  2.  Iron deficiency anemia - Labs from 02/11/2023 show mild iron deficiency anemia, Hgb 11.3/MCV 80, ferritin 17, iron saturation 11%. - Colonoscopy (10/29/2016):  Diverticulosis, internal hemorrhoids - EGD (10/29/2016): Dysphagia due to GERD/peptic stricture, mild gastritis - Per last visit with Dr. Ellin Saba, patient was instructed to take iron tablets 3 times weekly (since May 2023).  Patient stopped taking these a few months ago. - She reports dark bowel movements, but denies any gross hematochezia or melena. - She reports fatigue, pica, DOE, and lightheadedness - PLAN: Check stool cards x 3.   - Patient instructed to restart iron tablet DAILY - We will repeat labs (CBC and iron panel) in 6 months  3.  Bone health: - DEXA scan on 02/08/2021 T score -1.7, osteopenia. - Labs from 02/11/2023 show normal vitamin D32.17, normal calcium 8.9 - She is taking vitamin D3 (unknown dose) once daily - PLAN: She is due for bone density scan  - Continue vitamin D.  Recheck in 1 year (May 2025)  PLAN SUMMARY:  >> Check stool cards x 3 >> Due for annual screening mammogram >> Due for bone density scan >> Labs in 6 months = CBC/D, CMP, ferritin, iron/TIBC >> OFFICE visit in 6 months (after labs) to discuss iron deficiency anemia  ** Will CALL with results after mammogram and bone density scan     REVIEW OF SYSTEMS:   Review of Systems  Constitutional:  Positive for fatigue. Negative for appetite change, chills, diaphoresis, fever and unexpected weight change.  HENT:   Negative for lump/mass and nosebleeds.   Eyes:  Negative for eye problems.  Respiratory:  Positive for cough and shortness of breath. Negative for hemoptysis.   Cardiovascular:  Negative for chest pain, leg swelling and palpitations.  Gastrointestinal:  Positive for diarrhea and nausea. Negative for abdominal pain, blood in stool, constipation and vomiting.  Genitourinary:  Positive for bladder incontinence. Negative for hematuria.   Skin: Negative.   Neurological:  Positive for dizziness, light-headedness and numbness. Negative for headaches.  Hematological:  Does not bruise/bleed easily.   Psychiatric/Behavioral:  Positive for sleep disturbance. The patient is nervous/anxious.     PHYSICAL EXAM:   Performance status (ECOG): 1 - Symptomatic but completely ambulatory  There were no vitals filed for this visit. Wt Readings from Last 3 Encounters:  06/17/23 171 lb (77.6 kg)  06/05/23 177 lb (80.3 kg)  05/27/23 172 lb (78 kg)   Physical Exam Constitutional:      Appearance: Normal appearance. She is obese.  Cardiovascular:     Heart sounds: Murmur heard.  Pulmonary:     Breath sounds: Normal breath sounds.  Chest:       Comments: S/p right mastectomy. Left breast without any nodules or masses. No supraclavicular, axillary, or pectoral lymphadenopathy bilaterally. Neurological:     General: No focal deficit present.     Mental Status: Mental status is at baseline.  Psychiatric:        Behavior: Behavior normal. Behavior is cooperative.      PAST MEDICAL/SURGICAL HISTORY:  Past Medical History:  Diagnosis Date   (HFpEF) heart failure with preserved ejection fraction (HCC)    Abdominal hernia 02/26/2012   unrepaired   Adenocarcinoma of breast (HCC) 1997   right / chemo + tamoxifen x 5 years    Anemia  Anxiety    Aortic atherosclerosis (HCC)    Arthritis    Blood transfusion 1980's   Breast cancer (HCC)    Cancer (HCC)    Phreesia 08/18/2020   Complication of anesthesia    has a hard time waking up; can't lay flat   Degenerative disc disease, lumbar    pressing on L3 and L4   Diabetes mellitus (HCC) 1989   Type 2 NIDDM; "cancer treatment gave me diabetes"   Diabetes mellitus without complication (HCC)    Phreesia 08/18/2020   Diverticulosis    DJD (degenerative joint disease) of lumbar spine    Dysrhythmia    "irregular"   Exertional dyspnea    Gastroparesis    GERD (gastroesophageal reflux disease)    mann   Heart murmur    "think I outgrew it"   Hepatic steatosis    History of kidney stones    History of stomach ulcers    Hx: UTI  (urinary tract infection)    Hyperlipidemia    Hypersensitivity    in tongue   Hypertension    IBS (irritable bowel syndrome)    Insomnia    Internal hemorrhoids    Kidney cysts    RT KIDNEY   Kidney stones 2009   s/p lithotripsy   Mild CAD    Mild valvular heart disease    Nephrolithiasis 04/13/2014   Obesity    PFO (patent foramen ovale)    PONV (postoperative nausea and vomiting)    Pulmonary nodule    Shortness of breath    unable to lie flat   Ventral hernia    Past Surgical History:  Procedure Laterality Date   ABDOMINAL HYSTERECTOMY  2002   APPENDECTOMY     BACK SURGERY     Can't take any medical procedure in right arm   BREAST BIOPSY     right   BREAST SURGERY N/A    Phreesia 04/22/2020   CATARACT EXTRACTION W/ INTRAOCULAR LENS  IMPLANT, BILATERAL  2009-2010   CESAREAN SECTION  1973; 1978; 1981   CESAREAN SECTION N/A    Phreesia 04/22/2020   COLONOSCOPY WITH PROPOFOL N/A 10/29/2016   Procedure: COLONOSCOPY WITH PROPOFOL;  Surgeon: West Bali, MD;  Location: [facility];  Service: Endoscopy;  Laterality: N/A;  10:00 am   CYSTOSCOPY Left 04/13/2014   Procedure: CYSTOSCOPY FLEXIBLE;  Surgeon: Crist Fat, MD;  Location: [facility];  Service: Urology;  Laterality: Left;  with STENT   ESOPHAGOGASTRODUODENOSCOPY (EGD) WITH PROPOFOL N/A 10/29/2016   Procedure: ESOPHAGOGASTRODUODENOSCOPY (EGD) WITH PROPOFOL;  Surgeon: West Bali, MD;  Location: [facility];  Service: Endoscopy;  Laterality: N/A;   EYE SURGERY     implant and screws put in the right lower jaw  11/2008   dental surgery   kidney blockage  ?1990's   " major surgery ;put kidney on pump for awhile"   LITHOTRIPSY     "several times"   LUMBAR FUSION  08/2010   MASTECTOMY  1997   right   NEPHROLITHOTOMY Left 04/13/2014   Procedure: LEFT PERCUTANEOUS NEPHROLITHOTOMY WITH SURGEON ACCESS;  Surgeon: Crist Fat, MD;  Location: [facility];  Service: Urology;  Laterality: Left;   OVARIAN CYST  SURGERY     PARATHYROIDECTOMY  02/26/2012   Procedure: PARATHYROIDECTOMY;  Surgeon: Darletta Moll, MD;  [facility];  Service: ENT;;   SAVORY DILATION N/A 10/29/2016   Procedure: SAVORY DILATION;  Surgeon: West Bali, MD;  Location: [facility];  Service: Endoscopy;  Laterality: N/A;   SPINE SURGERY  2011   urological surgery for blocked ureter secondary to kidney stone      SOCIAL HISTORY:  Social History   Socioeconomic History   Marital status: Married    Spouse name: Not on file   Number of children: 3   Years of education: Not on file   Highest education level: Not on file  Occupational History   Occupation: retired in 2011  Tobacco Use   Smoking status: Never   Smokeless tobacco: Never  Vaping Use   Vaping status: Never Used  Substance and Sexual Activity   Alcohol use: Yes    Alcohol/week: 0.0 standard drinks of alcohol    Comment: X2 DRINKS PER YEAR   Drug use: No   Sexual activity: Not Currently    Birth control/protection: Surgical  Other Topics Concern   Not on file  Social History Narrative   Right handed   Caffeine-2 cups daily occasionally   Social Determinants of Health   Financial Resource Strain: Low Risk  (12/15/2022)   Overall Financial Resource Strain (CARDIA)    Difficulty of Paying Living Expenses: Not hard at all  Food Insecurity: No Food Insecurity (12/15/2022)   Hunger Vital Sign    Worried About Running Out of Food in the Last Year: Never true    Ran Out of Food in the Last Year: Never true  Transportation Needs: No Transportation Needs (12/15/2022)   PRAPARE - Administrator, Civil Service (Medical): No    Lack of Transportation (Non-Medical): No  Physical Activity: Unknown (12/15/2022)   Exercise Vital Sign    Days of Exercise per Week: 0 days    Minutes of Exercise per Session: Not on file  Stress: Stress Concern Present (12/15/2022)   [employer] of Occupational Health - Occupational Stress Questionnaire    Feeling  of Stress : To some extent  Social Connections: Socially Integrated (12/15/2022)   Social Connection and Isolation Panel [NHANES]    Frequency of Communication with Friends and Family: More than three times a week    Frequency of Social Gatherings with Friends and Family: Once a week    Attends Religious Services: More than 4 times per year    Active Member of [employer] or Organizations: Yes    Attends Banker Meetings: 1 to 4 times per year    Marital Status: Married  Catering manager Violence: Not on file    FAMILY HISTORY:  Family History  Problem Relation Age of Onset   Birth defects Mother    Heart attack Mother    Cancer Brother        Esophageal; smoker; deceased 81s   Colon cancer Brother    Diabetes Daughter    Leukemia Paternal Aunt        deceased 77s   Pancreatic cancer Paternal Aunt        deceased 49   Cancer Paternal Aunt        GI cancer; deceased 62s   Cancer Cousin        kidney cancer; deceased 60; pat first cousin; daughter of aunt w/ leukemia   Cancer Cousin        colon cancer @ 34; pat first cousin; son of aunt with GI cancer   Anesthesia problems Neg Hx     CURRENT MEDICATIONS:  Current Outpatient Medications  Medication Sig Dispense Refill   acetaminophen (TYLENOL) 325 MG tablet Take 325 mg  by mouth every 6 (six) hours as needed for mild pain.     amLODipine (NORVASC) 5 MG tablet Take 1 tablet (5 mg total) by mouth daily. 90 tablet 3   blood glucose meter kit and supplies KIT Dispense based on patient and insurance preference. Use up to four times daily as directed. 1 each 0   Blood Glucose Monitoring Suppl (BLOOD GLUCOSE SYSTEM PAK) KIT Use as directed to monitor FSBS 1x daily. Dx: E11.9 1 each 1   Cholecalciferol (VITAMIN D3 PO) Take 1 tablet by mouth daily.     CINNAMON PO Take 1 capsule by mouth. Takes sometimes     Continuous Glucose Sensor (FREESTYLE LIBRE 3 SENSOR) MISC Use it to check blood glucose as instructed. 2 each 5    diclofenac Sodium (VOLTAREN) 1 % GEL Apply 4 g topically 4 (four) times daily. 4 g 1   DULoxetine (CYMBALTA) 60 MG capsule Take 1 capsule (60 mg total) by mouth daily. 30 capsule 11   famotidine (PEPCID) 20 MG tablet Take 1 tablet (20 mg total) by mouth 2 (two) times daily. 60 tablet 5   ferrous sulfate 325 (65 FE) MG EC tablet Take 1 tablet (325 mg total) by mouth daily with breakfast. 90 tablet 3   furosemide (LASIX) 40 MG tablet Take 1 tablet (40 mg total) by mouth daily as needed (FOR SWELLING). 90 tablet 0   glipiZIDE (GLUCOTROL) 10 MG tablet Take 1 tablet (10 mg total) by mouth 2 (two) times daily before a meal. 180 tablet 3   glucose blood (ACCU-CHEK GUIDE) test strip 1 each by Other route as needed for other. Use as instructed to check blood sugar daily. 100 each 3   insulin glargine (LANTUS SOLOSTAR) 100 UNIT/ML Solostar Pen Inject 10 Units into the skin at bedtime. 15 mL 2   Insulin Pen Needle 32G X 4 MM MISC Use as directed for insulin administration. 100 each 3   LANCETS SUPER THIN 28G MISC Use to check blood sugar once daily. 100 each 3   metFORMIN (GLUCOPHAGE-XR) 500 MG 24 hr tablet TAKE ONE TABLET BY MOUTH ONCE DAILY WITH BREAKFAST. 90 tablet 0   metoprolol tartrate (LOPRESSOR) 25 MG tablet TAKE (1) TABLET BY MOUTH TWICE DAILY. 180 tablet 0   Multiple Vitamin (MULTIVITAMIN) tablet Take 1 tablet by mouth daily.     potassium chloride (KLOR-CON) 10 MEQ tablet Take 1 tablet (10 mEq total) by mouth daily. 90 tablet 1   triamcinolone (KENALOG) 0.025 % ointment Apply 1 Application topically 2 (two) times daily. 30 g 0   No current facility-administered medications for this visit.    ALLERGIES:  Allergies  Allergen Reactions   Demerol [Meperidine] Other (See Comments)    Lost consciousness   Bentyl [Dicyclomine Hcl] Swelling   Invokana [Canagliflozin] Hives   Metformin And Related Diarrhea   Protonix [Pantoprazole Sodium] Swelling   Statins Other (See Comments)    myalgia   Ace  Inhibitors Cough    LABORATORY DATA:  I have reviewed the labs as listed.     Latest Ref Rng & Units 08/11/2023    8:08 AM 02/11/2023    1:29 PM 09/17/2022   11:35 AM  CBC  WBC 4.0 - 10.5 K/uL 8.7  8.6  8.5   Hemoglobin 12.0 - 15.0 g/dL 40.9  81.1  91.4   Hematocrit 36.0 - 46.0 % 35.2  35.1  32.1   Platelets 150 - 400 K/uL 260  241  240  Latest Ref Rng & Units 05/02/2023   10:58 AM 02/11/2023    1:29 PM 12/19/2022    3:47 PM  CMP  Glucose 70 - 99 mg/dL 962  952  841   BUN 8 - 27 mg/dL 9  10  13    Creatinine 0.57 - 1.00 mg/dL 3.24  4.01  0.27   Sodium 134 - 144 mmol/L 141  133  141   Potassium 3.5 - 5.2 mmol/L 3.6  3.1  3.8   Chloride 96 - 106 mmol/L 99  97  99   CO2 20 - 29 mmol/L 25  26  21    Calcium 8.7 - 10.3 mg/dL 9.5  8.9  9.6   Total Protein 6.0 - 8.5 g/dL 6.9  7.4  7.4   Total Bilirubin 0.0 - 1.2 mg/dL 0.4  0.6  0.6   Alkaline Phos 44 - 121 IU/L 71  57  91   AST 0 - 40 IU/L 20  22  19    ALT 0 - 32 IU/L 19  22  20      DIAGNOSTIC IMAGING:  I have independently reviewed the scans and discussed with the patient. No results found.   WRAP UP:  All questions were answered. The patient knows to call the clinic with any problems, questions or concerns.  Medical decision making: Moderate  Time spent on visit: I spent 25 minutes counseling the patient face to face. The total time spent in the appointment was 40 minutes and more than 50% was on counseling.  Mauro Kaufmann, NP  02/18/2023 4:52 PM

## 2023-09-03 ENCOUNTER — Ambulatory Visit: Payer: Medicare Other | Admitting: Internal Medicine

## 2023-09-08 ENCOUNTER — Ambulatory Visit: Payer: Medicare Other | Admitting: Nutrition

## 2023-10-28 ENCOUNTER — Ambulatory Visit: Payer: Medicare Other | Admitting: Nutrition

## 2023-11-04 LAB — CMP14+EGFR
ALT: 12 [IU]/L (ref 0–32)
AST: 16 [IU]/L (ref 0–40)
Albumin: 3.9 g/dL (ref 3.8–4.8)
Alkaline Phosphatase: 75 [IU]/L (ref 44–121)
BUN/Creatinine Ratio: 12 (ref 12–28)
BUN: 11 mg/dL (ref 8–27)
Bilirubin Total: 0.3 mg/dL (ref 0.0–1.2)
CO2: 21 mmol/L (ref 20–29)
Calcium: 9.4 mg/dL (ref 8.7–10.3)
Chloride: 103 mmol/L (ref 96–106)
Creatinine, Ser: 0.94 mg/dL (ref 0.57–1.00)
Globulin, Total: 3.1 g/dL (ref 1.5–4.5)
Glucose: 209 mg/dL — ABNORMAL HIGH (ref 70–99)
Potassium: 4.1 mmol/L (ref 3.5–5.2)
Sodium: 144 mmol/L (ref 134–144)
Total Protein: 7 g/dL (ref 6.0–8.5)
eGFR: 63 mL/min/{1.73_m2} (ref >59)

## 2023-11-04 LAB — LIPID PANEL
Chol/HDL Ratio: 2.7 {ratio} (ref 0.0–4.4)
Cholesterol, Total: 213 mg/dL — ABNORMAL HIGH (ref 100–199)
HDL: 78 mg/dL (ref >39)
LDL Chol Calc (NIH): 112 mg/dL — ABNORMAL HIGH (ref 0–99)
Triglycerides: 131 mg/dL (ref 0–149)
VLDL Cholesterol Cal: 23 mg/dL (ref 5–40)

## 2023-11-04 LAB — HEMOGLOBIN A1C
Est. average glucose Bld gHb Est-mCnc: 212 mg/dL
Hgb A1c MFr Bld: 9 % — ABNORMAL HIGH (ref 4.8–5.6)

## 2023-11-06 ENCOUNTER — Encounter: Payer: Self-pay | Admitting: Internal Medicine

## 2023-11-06 ENCOUNTER — Ambulatory Visit: Payer: Medicare Other | Admitting: Internal Medicine

## 2023-11-06 VITALS — BP 132/70 | HR 91 | Ht 66.5 in | Wt 172.8 lb

## 2023-11-06 DIAGNOSIS — Z0001 Encounter for general adult medical examination with abnormal findings: Secondary | ICD-10-CM

## 2023-11-06 DIAGNOSIS — E1165 Type 2 diabetes mellitus with hyperglycemia: Secondary | ICD-10-CM | POA: Diagnosis not present

## 2023-11-06 DIAGNOSIS — G720 Drug-induced myopathy: Secondary | ICD-10-CM | POA: Diagnosis not present

## 2023-11-06 DIAGNOSIS — R221 Localized swelling, mass and lump, neck: Secondary | ICD-10-CM

## 2023-11-06 DIAGNOSIS — I10 Essential (primary) hypertension: Secondary | ICD-10-CM

## 2023-11-06 DIAGNOSIS — Z794 Long term (current) use of insulin: Secondary | ICD-10-CM

## 2023-11-06 DIAGNOSIS — T466X5D Adverse effect of antihyperlipidemic and antiarteriosclerotic drugs, subsequent encounter: Secondary | ICD-10-CM

## 2023-11-06 MED ORDER — AMLODIPINE BESYLATE 5 MG PO TABS: 5.0000 mg | ORAL_TABLET | Freq: Every day | ORAL | 3 refills | Status: AC

## 2023-11-06 MED ORDER — LANTUS SOLOSTAR 100 UNIT/ML ~~LOC~~ SOPN: 15.0000 [IU] | PEN_INJECTOR | Freq: Every day | SUBCUTANEOUS | 2 refills | Status: DC

## 2023-11-06 NOTE — Assessment & Plan Note (Signed)
She has left-sided neck mass behind TMJ Unclear if parotid extension versus enlarged cyst - check [place] of neck

## 2023-11-06 NOTE — Assessment & Plan Note (Signed)
BP Readings from Last 1 Encounters:  11/06/23 132/70   Well controlled with amlodipine and metoprolol Counseled for compliance with the medications Advised DASH diet and moderate exercise/walking, at least 150 mins/week

## 2023-11-06 NOTE — Assessment & Plan Note (Signed)
Physical exam as documented. Counseling done  re healthy lifestyle involving commitment to 150 minutes exercise per week, heart healthy diet, and attaining healthy weight.The importance of adequate sleep also discussed. Immunization and cancer screening needs are specifically addressed at this visit.

## 2023-11-06 NOTE — Assessment & Plan Note (Signed)
Lab Results  Component Value Date   HGBA1C 9.0 (H) 11/03/2023   Uncontrolled, but improving On Lantus 12 U qHS, glipizide 10 mg BID and Metformin 500 XR mg QD as she had diarrhea with regular Metformin Increased dose of Lantus to 15 U once daily, advised to take it in the morning - she denies to use ISS Did not tolerate Jardiance, Januvia and GLP-1 agonist in the past  She likely has mostly hyperglycemia, but due to her diet noncompliance, concern for hypoglycemia as well - she would benefit from CGM, has freestyle libre 3 Needs to follow diabetic diet - refer to nutritional counseling as she reports noncompliance to diabetic diet F/u CMP and lipid panel Diabetic eye exam: Advised to follow up with Ophthalmology for diabetic eye exam

## 2023-11-06 NOTE — Assessment & Plan Note (Signed)
Did not tolerate statin in the past Has tried Nexletol as well Followed by cardiology/lipid clinic

## 2023-11-06 NOTE — Progress Notes (Signed)
Established Patient Office Visit  Subjective:  Patient ID: [name], female    DOB: 12-Aug-1946  Age: 78 y.o. MRN: [id number]  CC:  Chief Complaint  Patient presents with   Annual Exam    Cpe , would like to discuss diabetes medication states insulin has been causing headaches.    HPI [name] is a 78 y.o. female with past medical history of HTN, DM, breast ca s/p right mastectomy and chemotherapy, HLD, DDD of lumbar spine and GERD who presents for annual physical.  Her BP was wnl today.  She has been taking amlodipine and metoprolol now.  She denies any headache, chest pain, dyspnea or palpitations.  She takes Lantus 12 units nightly, glipizide 10 mg BID and metformin ER 500 mg QD for DM. Her HbA1C has improved to 9.0 from 11.1 in 08/24. She denies polyuria or polydipsia. She did not tolerate Januvia, Jardiance and Trulicity in the past.  She saw nutritionist and had freestyle libre. BS have been 20% in range and 60% above range and 20% high range overall.  She has had hypoglycemia spells in the morning sometimes. She did not tolerate statin in the past and has tried taking Nexletol, sent by lipid clinic.  She complains of chronic fatigue and also reports having neuropathic pain in her legs and feet, which have been chronic, but is improved now. She agrees to take Duloxetine for now.  She reports noticing neck mass behind TMJ, which have been recurrently enlarging for the last 4 months.  Denies any pain while chewing.  Denies any pain while neck movement currently.  Past Medical History:  Diagnosis Date   (HFpEF) heart failure with preserved ejection fraction (HCC)    Abdominal hernia 02/26/2012   unrepaired   Adenocarcinoma of breast (HCC) 1997   right / chemo + tamoxifen x 5 years    Anemia    Anxiety    Aortic atherosclerosis (HCC)    Arthritis    Blood transfusion 1980's   Breast cancer (HCC)    Cancer (HCC)    Phreesia 08/18/2020   Complication of anesthesia     has a hard time waking up; can't lay flat   Degenerative disc disease, lumbar    pressing on L3 and L4   Diabetes mellitus (HCC) 1989   Type 2 NIDDM; "cancer treatment gave me diabetes"   Diabetes mellitus without complication (HCC)    Phreesia 08/18/2020   Diverticulosis    DJD (degenerative joint disease) of lumbar spine    Dysrhythmia    "irregular"   Exertional dyspnea    Gastroparesis    GERD (gastroesophageal reflux disease)    mann   Heart murmur    "think I outgrew it"   Hepatic steatosis    History of kidney stones    History of stomach ulcers    Hx: UTI (urinary tract infection)    Hyperlipidemia    Hypersensitivity    in tongue   Hypertension    IBS (irritable bowel syndrome)    Insomnia    Internal hemorrhoids    Kidney cysts    RT KIDNEY   Kidney stones 2009   s/p lithotripsy   Mild CAD    Mild valvular heart disease    Nephrolithiasis 04/13/2014   Obesity    PFO (patent foramen ovale)    PONV (postoperative nausea and vomiting)    Pulmonary nodule    Shortness of breath    unable to lie flat  Ventral hernia     Past Surgical History:  Procedure Laterality Date   ABDOMINAL HYSTERECTOMY  2002   APPENDECTOMY     BACK SURGERY     Can't take any medical procedure in right arm   BREAST BIOPSY     right   BREAST SURGERY N/A    Phreesia 04/22/2020   CATARACT EXTRACTION W/ INTRAOCULAR LENS  IMPLANT, BILATERAL  2009-2010   CESAREAN SECTION  1973; 1978; 1981   CESAREAN SECTION N/A    Phreesia 04/22/2020   COLONOSCOPY WITH PROPOFOL N/A 10/29/2016   Procedure: COLONOSCOPY WITH PROPOFOL;  Surgeon: West Bali, MD;  Location: [facility];  Service: Endoscopy;  Laterality: N/A;  10:00 am   CYSTOSCOPY Left 04/13/2014   Procedure: CYSTOSCOPY FLEXIBLE;  Surgeon: Crist Fat, MD;  Location: [facility];  Service: Urology;  Laterality: Left;  with STENT   ESOPHAGOGASTRODUODENOSCOPY (EGD) WITH PROPOFOL N/A 10/29/2016   Procedure: ESOPHAGOGASTRODUODENOSCOPY  (EGD) WITH PROPOFOL;  Surgeon: West Bali, MD;  Location: [facility];  Service: Endoscopy;  Laterality: N/A;   EYE SURGERY     implant and screws put in the right lower jaw  11/2008   dental surgery   kidney blockage  ?1990's   " major surgery ;put kidney on pump for awhile"   LITHOTRIPSY     "several times"   LUMBAR FUSION  08/2010   MASTECTOMY  1997   right   NEPHROLITHOTOMY Left 04/13/2014   Procedure: LEFT PERCUTANEOUS NEPHROLITHOTOMY WITH SURGEON ACCESS;  Surgeon: Crist Fat, MD;  Location: [facility];  Service: Urology;  Laterality: Left;   OVARIAN CYST SURGERY     PARATHYROIDECTOMY  02/26/2012   Procedure: PARATHYROIDECTOMY;  Surgeon: Darletta Moll, MD;  Location[facility];  Service: ENT;;   SAVORY DILATION N/A 10/29/2016   Procedure: SAVORY DILATION;  Surgeon: West Bali, MD;  Location: [facility];  Service: Endoscopy;  Laterality: N/A;   SPINE SURGERY  2011   urological surgery for blocked ureter secondary to kidney stone      Family History  Problem Relation Age of Onset   Birth defects Mother    Heart attack Mother    Cancer Brother        Esophageal; smoker; deceased 68s   Colon cancer Brother    Diabetes Daughter    Leukemia Paternal Aunt        deceased 44s   Pancreatic cancer Paternal Aunt        deceased 71   Cancer Paternal Aunt        GI cancer; deceased 11s   Cancer Cousin        kidney cancer; deceased 81; pat first cousin; daughter of aunt w/ leukemia   Cancer Cousin        colon cancer @ 61; pat first cousin; son of aunt with GI cancer   Anesthesia problems Neg Hx     Social History   Socioeconomic History   Marital status: Married    Spouse name: Not on file   Number of children: 3   Years of education: Not on file   Highest education level: Not on file  Occupational History   Occupation: retired in 2011  Tobacco Use   Smoking status: Never   Smokeless tobacco: Never  Vaping Use   Vaping status: Never Used  Substance and Sexual  Activity   Alcohol use: Yes    Alcohol/week: 0.0 standard drinks of alcohol    Comment:  X2 DRINKS PER YEAR   Drug use: No   Sexual activity: Not Currently    Birth control/protection: Surgical  Other Topics Concern   Not on file  Social History Narrative   Right handed   Caffeine-2 cups daily occasionally   Social Drivers of Health   Financial Resource Strain: Low Risk  (12/15/2022)   Overall Financial Resource Strain (CARDIA)    Difficulty of Paying Living Expenses: Not hard at all  Food Insecurity: No Food Insecurity (12/15/2022)   Hunger Vital Sign    Worried About Running Out of Food in the Last Year: Never true    Ran Out of Food in the Last Year: Never true  Transportation Needs: No Transportation Needs (12/15/2022)   PRAPARE - Administrator, Civil Service (Medical): No    Lack of Transportation (Non-Medical): No  Physical Activity: Unknown (12/15/2022)   Exercise Vital Sign    Days of Exercise per Week: 0 days    Minutes of Exercise per Session: Not on file  Stress: Stress Concern Present (12/15/2022)   [employer] of Occupational Health - Occupational Stress Questionnaire    Feeling of Stress : To some extent  Social Connections: Socially Integrated (12/15/2022)   Social Connection and Isolation Panel [NHANES]    Frequency of Communication with Friends and Family: More than three times a week    Frequency of Social Gatherings with Friends and Family: Once a week    Attends Religious Services: More than 4 times per year    Active Member of [employer] or Organizations: Yes    Attends Banker Meetings: 1 to 4 times per year    Marital Status: Married  Catering manager Violence: Not on file    Outpatient Medications Prior to Visit  Medication Sig Dispense Refill   blood glucose meter kit and supplies KIT Dispense based on patient and insurance preference. Use up to four times daily as directed. 1 each 0   Blood Glucose Monitoring Suppl (BLOOD  GLUCOSE SYSTEM PAK) KIT Use as directed to monitor FSBS 1x daily. Dx: E11.9 1 each 1   Cholecalciferol (VITAMIN D3 PO) Take 1 tablet by mouth daily.     CINNAMON PO Take 1 capsule by mouth. Takes sometimes     Continuous Glucose Sensor (FREESTYLE LIBRE 3 SENSOR) MISC Use it to check blood glucose as instructed. 2 each 5   DULoxetine (CYMBALTA) 60 MG capsule Take 1 capsule (60 mg total) by mouth daily. 30 capsule 11   ferrous sulfate 325 (65 FE) MG EC tablet Take 1 tablet (325 mg total) by mouth daily with breakfast. 90 tablet 3   glipiZIDE (GLUCOTROL) 10 MG tablet Take 1 tablet (10 mg total) by mouth 2 (two) times daily before a meal. 180 tablet 3   glucose blood (ACCU-CHEK GUIDE) test strip 1 each by Other route as needed for other. Use as instructed to check blood sugar daily. 100 each 3   Insulin Pen Needle 32G X 4 MM MISC Use as directed for insulin administration. 100 each 3   LANCETS SUPER THIN 28G MISC Use to check blood sugar once daily. 100 each 3   metFORMIN (GLUCOPHAGE-XR) 500 MG 24 hr tablet TAKE ONE TABLET BY MOUTH ONCE DAILY WITH BREAKFAST. 90 tablet 0   metoprolol tartrate (LOPRESSOR) 25 MG tablet TAKE (1) TABLET BY MOUTH TWICE DAILY. 180 tablet 0   Multiple Vitamin (MULTIVITAMIN) tablet Take 1 tablet by mouth daily.     potassium chloride (KLOR-CON)  10 MEQ tablet Take 1 tablet (10 mEq total) by mouth daily. 90 tablet 1   insulin glargine (LANTUS SOLOSTAR) 100 UNIT/ML Solostar Pen Inject 10 Units into the skin at bedtime. (Patient taking differently: Inject 12 Units into the skin at bedtime.) 15 mL 2   acetaminophen (TYLENOL) 325 MG tablet Take 325 mg by mouth every 6 (six) hours as needed for mild pain.     furosemide (LASIX) 40 MG tablet Take 1 tablet (40 mg total) by mouth daily as needed (FOR SWELLING). 90 tablet 0   amLODipine (NORVASC) 5 MG tablet Take 1 tablet (5 mg total) by mouth daily. 90 tablet 3   diclofenac Sodium (VOLTAREN) 1 % GEL Apply 4 g topically 4 (four) times  daily. 4 g 1   famotidine (PEPCID) 20 MG tablet Take 1 tablet (20 mg total) by mouth 2 (two) times daily. 60 tablet 5   triamcinolone (KENALOG) 0.025 % ointment Apply 1 Application topically 2 (two) times daily. 30 g 0   No facility-administered medications prior to visit.    Allergies  Allergen Reactions   Demerol [Meperidine] Other (See Comments)    Lost consciousness   Bentyl [Dicyclomine Hcl] Swelling   Invokana [Canagliflozin] Hives   Metformin And Related Diarrhea   Protonix [Pantoprazole Sodium] Swelling   Statins Other (See Comments)    myalgia   Ace Inhibitors Cough    ROS Review of Systems  Constitutional:  Positive for fatigue. Negative for chills and fever.  HENT:  Negative for congestion, sinus pressure, sinus pain and sore throat.   Eyes:  Negative for pain and discharge.  Respiratory:  Negative for cough and shortness of breath.   Cardiovascular:  Negative for chest pain and palpitations.  Gastrointestinal:  Negative for abdominal pain, diarrhea, nausea and vomiting.  Endocrine: Negative for polydipsia and polyuria.  Genitourinary:  Negative for dysuria and hematuria.  Musculoskeletal:  Positive for arthralgias and back pain. Negative for neck pain and neck stiffness.  Skin:  Negative for rash.  Neurological:  Positive for weakness (B/l LE) and numbness (B/l hands).       Numbness and tingling in b/l LE  Psychiatric/Behavioral:  Negative for agitation and behavioral problems.       Objective:    Physical Exam Vitals reviewed.  Constitutional:      General: She is not in acute distress.    Appearance: She is not diaphoretic.  HENT:     Head: Normocephalic and atraumatic.     Nose: Nose normal.     Mouth/Throat:     Mouth: Mucous membranes are moist.  Eyes:     General: No scleral icterus.    Extraocular Movements: Extraocular movements intact.  Neck:     Comments: Mass behind left TMJ, about 2.5 cm X 2 cm oval shaped, nontender Cardiovascular:      Rate and Rhythm: Normal rate and regular rhythm.     Heart sounds: Normal heart sounds. No murmur heard. Pulmonary:     Breath sounds: Normal breath sounds. No wheezing or rales.  Abdominal:     Palpations: Abdomen is soft.     Tenderness: There is no abdominal tenderness.  Musculoskeletal:     Cervical back: Neck supple. No tenderness.     Right lower leg: No edema.     Left lower leg: No edema.  Skin:    General: Skin is warm.     Findings: No rash.  Neurological:     General: No focal deficit present.  Mental Status: She is alert and oriented to person, place, and time.     Sensory: Sensory deficit (B/l feet) present.     Motor: Weakness (B/l LE - 4/5) present.  Psychiatric:        Mood and Affect: Mood normal.        Behavior: Behavior normal.     BP 132/70 (BP Location: Left Arm)   Pulse 91   Ht 5' 6.5" (1.689 m)   Wt 172 lb 12.8 oz (78.4 kg)   SpO2 97%   BMI 27.47 kg/m  Wt Readings from Last 3 Encounters:  11/06/23 172 lb 12.8 oz (78.4 kg)  06/17/23 171 lb (77.6 kg)  06/05/23 177 lb (80.3 kg)    Lab Results  Component Value Date   TSH 0.844 09/17/2022   Lab Results  Component Value Date   WBC 8.7 08/11/2023   HGB 11.4 (L) 08/11/2023   HCT 35.2 (L) 08/11/2023   MCV 80.7 08/11/2023   PLT 260 08/11/2023   Lab Results  Component Value Date   NA 144 11/03/2023   K 4.1 11/03/2023   CO2 21 11/03/2023   GLUCOSE 209 (H) 11/03/2023   BUN 11 11/03/2023   CREATININE 0.94 11/03/2023   BILITOT 0.3 11/03/2023   ALKPHOS 75 11/03/2023   AST 16 11/03/2023   ALT 12 11/03/2023   PROT 7.0 11/03/2023   ALBUMIN 3.9 11/03/2023   CALCIUM 9.4 11/03/2023   ANIONGAP 10 02/11/2023   EGFR 63 11/03/2023   GFR 55.96 (L) 02/23/2020   Lab Results  Component Value Date   CHOL 213 (H) 11/03/2023   Lab Results  Component Value Date   HDL 78 11/03/2023   Lab Results  Component Value Date   LDLCALC 112 (H) 11/03/2023   Lab Results  Component Value Date   TRIG 131  11/03/2023   Lab Results  Component Value Date   CHOLHDL 2.7 11/03/2023   Lab Results  Component Value Date   HGBA1C 9.0 (H) 11/03/2023      Assessment & Plan:   Problem List Items Addressed This Visit       Cardiovascular and Mediastinum   Essential hypertension   BP Readings from Last 1 Encounters:  11/06/23 132/70   Well controlled with amlodipine and metoprolol Counseled for compliance with the medications Advised DASH diet and moderate exercise/walking, at least 150 mins/week      Relevant Medications   amLODipine (NORVASC) 5 MG tablet     Endocrine   Uncontrolled type 2 diabetes mellitus with hyperglycemia, without long-term current use of insulin (HCC)   Lab Results  Component Value Date   HGBA1C 9.0 (H) 11/03/2023   Uncontrolled, but improving On Lantus 12 U qHS, glipizide 10 mg BID and Metformin 500 XR mg QD as she had diarrhea with regular Metformin Increased dose of Lantus to 15 U once daily, advised to take it in the morning - she denies to use ISS Did not tolerate Jardiance, Januvia and GLP-1 agonist in the past  She likely has mostly hyperglycemia, but due to her diet noncompliance, concern for hypoglycemia as well - she would benefit from CGM, has freestyle libre 3 Needs to follow diabetic diet - refer to nutritional counseling as she reports noncompliance to diabetic diet F/u CMP and lipid panel Diabetic eye exam: Advised to follow up with Ophthalmology for diabetic eye exam      Relevant Medications   insulin glargine (LANTUS SOLOSTAR) 100 UNIT/ML Solostar Pen  Musculoskeletal and Integument   Statin myopathy   Did not tolerate statin in the past Has tried Nexletol as well Followed by cardiology/lipid clinic        Other   Encounter for general adult medical examination with abnormal findings - Primary   Physical exam as documented. Counseling done  re healthy lifestyle involving commitment to 150 minutes exercise per week, heart  healthy diet, and attaining healthy weight.The importance of adequate sleep also discussed. Immunization and cancer screening needs are specifically addressed at this visit.       Neck mass   She has left-sided neck mass behind TMJ Unclear if parotid extension versus enlarged cyst - check [place] of neck      Relevant Orders   US Soft Tissue Head/Neck (NON-THYROID)      Meds ordered this encounter  Medications   insulin glargine (LANTUS SOLOSTAR) 100 UNIT/ML Solostar Pen    Sig: Inject 15 Units into the skin daily.    Dispense:  15 mL    Refill:  2   amLODipine (NORVASC) 5 MG tablet    Sig: Take 1 tablet (5 mg total) by mouth daily.    Dispense:  90 tablet    Refill:  3    Follow-up: After 2 months.   Anabel Halon, MD

## 2023-11-06 NOTE — Patient Instructions (Addendum)
Please start taking Lantus 15 U in the morning instead of nighttime.  Please continue taking other medications as prescribed.  Please continue to follow low carb diet and perform moderate exercise/walking at least 150 mins/week.  Please consider getting Shingrix and Tdap vaccine at local pharmacy.

## 2023-11-14 ENCOUNTER — Other Ambulatory Visit: Payer: Self-pay | Admitting: Physician Assistant

## 2023-11-14 ENCOUNTER — Ambulatory Visit (HOSPITAL_COMMUNITY)
Admission: RE | Admit: 2023-11-14 | Discharge: 2023-11-14 | Disposition: A | Discharge status: Home / Self Care | Payer: Medicare Other | Source: Ambulatory Visit | Attending: Internal Medicine | Admitting: Internal Medicine

## 2023-11-14 DIAGNOSIS — R221 Localized swelling, mass and lump, neck: Secondary | ICD-10-CM | POA: Insufficient documentation

## 2023-12-01 ENCOUNTER — Encounter (INDEPENDENT_AMBULATORY_CARE_PROVIDER_SITE_OTHER): Payer: Self-pay | Admitting: Otolaryngology

## 2023-12-01 ENCOUNTER — Other Ambulatory Visit: Payer: Self-pay | Admitting: Internal Medicine

## 2023-12-01 DIAGNOSIS — K116 Mucocele of salivary gland: Secondary | ICD-10-CM | POA: Insufficient documentation

## 2023-12-10 ENCOUNTER — Other Ambulatory Visit: Payer: Self-pay | Admitting: Internal Medicine

## 2023-12-10 DIAGNOSIS — E1165 Type 2 diabetes mellitus with hyperglycemia: Secondary | ICD-10-CM

## 2023-12-17 ENCOUNTER — Ambulatory Visit: Payer: Medicare Other | Admitting: Neurology

## 2023-12-17 ENCOUNTER — Encounter: Payer: Self-pay | Admitting: Neurology

## 2023-12-17 DIAGNOSIS — E1143 Type 2 diabetes mellitus with diabetic autonomic (poly)neuropathy: Secondary | ICD-10-CM | POA: Diagnosis not present

## 2023-12-17 DIAGNOSIS — Z7984 Long term (current) use of oral hypoglycemic drugs: Secondary | ICD-10-CM | POA: Diagnosis not present

## 2023-12-17 MED ORDER — DULOXETINE HCL 60 MG PO CPEP: 60.0000 mg | ORAL_CAPSULE | Freq: Every day | ORAL | 3 refills | Status: AC

## 2023-12-17 NOTE — Patient Instructions (Signed)
 Continue current medications Continue follow-up with PCP, nutritionist Return as needed.

## 2023-12-17 NOTE — Progress Notes (Signed)
 [facility]  PATIENT: [name] DOB: 11-24-1945  REQUESTING CLINICIAN: Anabel Halon, MD HISTORY FROM: Patient and daughter  REASON FOR VISIT: Dizziness, multiple falls.    HISTORICAL  CHIEF COMPLAINT:  Chief Complaint  Patient presents with   Follow-up    Pt in 13, here alone  Pt is here following up on diabetic polyneuropathy. Pt states she has been doing well. No concerns.    INTERVAL HISTORY 12/17/2023:  Patient presents today for follow-up, last visit was in September, at that time we increased her duloxetine to 60 mg for her peripheral neuropathy.  She tells me that her pain is better controlled, she did follow-up with nutritionist and was told that her pancreas was not working properly.  She was put on insulin but her glucose levels are still high.  She tells me in the morning they run between 250 and 300.  She has not seen an endocrinologist.    INTERVAL HISTORY 06/05/2023 Patient presents today for follow-up, last visit was in March and since then she has completed PT, balance therapy.  She reports that PT was helpful.  She has not had any falls since then.  Her main complaint now is pain from her neuropathy.  She has uncontrolled diabetes, her most recent A1c is 11, and neuropathy is not improving.  She is on duloxetine 30 mg daily and gabapentin but stated that the gabapentin makes her very sleepy.  She has a nutritionist who is helping her with diet and is looking forward to set up care with endocrinologist for better control of her diabetes.    HISTORY OF PRESENT ILLNESS:  This is a 78 year old woman with past medical history of breast cancer, s/p chemotherapy, hypertension, diabetes, who is presenting with multiple falls. Patient reports since May of last year, she has a total of 6 falls resulting in torn ligament, and bruises. She denies any prodromes prior to the fall, no warning signs, no dizziness, no lightheadedness. During the last fall,  she was talking to her husband then collapsed to the ground, again no warning signs.  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   OTHER MEDICAL CONDITIONS: History of breast cancer, Hypertension, Diabetes    REVIEW OF SYSTEMS: Full 14 system review of systems performed and negative with exception of: As noted in the HPI   ALLERGIES: Allergies  Allergen Reactions   Demerol [Meperidine] Other (See Comments)    Lost consciousness   Bentyl [Dicyclomine Hcl] Swelling   Invokana [Canagliflozin] Hives   Metformin And Related Diarrhea   Protonix [Pantoprazole Sodium] Swelling   Statins Other (See Comments)    myalgia   Ace Inhibitors Cough    HOME MEDICATIONS: Outpatient Medications Prior to Visit  Medication Sig Dispense Refill   acetaminophen (TYLENOL) 325 MG tablet Take 325 mg by mouth every 6 (six) hours as needed for mild pain.     amLODipine (NORVASC) 5 MG tablet Take 1 tablet (5 mg total) by mouth daily. 90 tablet 3   blood glucose meter kit and supplies KIT Dispense based on patient and insurance preference. Use up to four times daily as directed. 1 each 0   Blood Glucose  Monitoring Suppl (BLOOD GLUCOSE SYSTEM PAK) KIT Use as directed to monitor FSBS 1x daily. Dx: E11.9 1 each 1   Cholecalciferol (VITAMIN D3 PO) Take 1 tablet by mouth daily.     CINNAMON PO Take 1 capsule by mouth. Takes sometimes     Continuous Glucose Sensor (FREESTYLE LIBRE 3 SENSOR) MISC Use it to check blood glucose as instructed. 2 each 5   ferrous sulfate 325 (65 FE) MG EC tablet Take 1 tablet (325 mg total) by mouth daily with breakfast. 90 tablet 3   furosemide (LASIX) 40 MG tablet Take 1 tablet (40 mg total) by mouth daily as needed (FOR SWELLING). 90 tablet 0    glipiZIDE (GLUCOTROL) 10 MG tablet Take 1 tablet (10 mg total) by mouth 2 (two) times daily before a meal. 180 tablet 3   glucose blood (ACCU-CHEK GUIDE) test strip 1 each by Other route as needed for other. Use as instructed to check blood sugar daily. 100 each 3   insulin glargine (LANTUS SOLOSTAR) 100 UNIT/ML Solostar Pen Inject 15 Units into the skin daily. 15 mL 2   Insulin Pen Needle 32G X 4 MM MISC Use as directed for insulin administration. 100 each 3   LANCETS SUPER THIN 28G MISC Use to check blood sugar once daily. 100 each 3   metFORMIN (GLUCOPHAGE-XR) 500 MG 24 hr tablet TAKE ONE TABLET BY MOUTH ONCE DAILY WITH BREAKFAST. 90 tablet 0   metoprolol tartrate (LOPRESSOR) 25 MG tablet Take 1 tablet (25 mg total) by mouth 2 (two) times daily. Please call office to schedule an appt for further refills. Thank you 60 tablet 0   Multiple Vitamin (MULTIVITAMIN) tablet Take 1 tablet by mouth daily.     potassium chloride (KLOR-CON) 10 MEQ tablet Take 1 tablet (10 mEq total) by mouth daily. 90 tablet 1   DULoxetine (CYMBALTA) 60 MG capsule Take 1 capsule (60 mg total) by mouth daily. 30 capsule 11   No facility-administered medications prior to visit.    PAST MEDICAL HISTORY: Past Medical History:  Diagnosis Date   (HFpEF) heart failure with preserved ejection fraction (HCC)    Abdominal hernia 02/26/2012   unrepaired   Adenocarcinoma of breast (HCC) 1997   right / chemo + tamoxifen x 5 years    Anemia    Anxiety    Aortic atherosclerosis (HCC)    Arthritis    Blood transfusion 1980's   Breast cancer (HCC)    Cancer (HCC)    [employer]reesia 08/18/2020   Complication of anesthesia    has a hard time waking up; can't lay flat   Degenerative disc disease, lumbar    pressing on L3 and L4   Diabetes mellitus (HCC) 1989   Type 2 NIDDM; "cancer treatment gave me diabetes"   Diabetes mellitus without complication (HCC)    [employer]reesia 08/18/2020   Diverticulosis    DJD (degenerative joint disease)  of lumbar spine    Dysrhythmia    "irregular"   Exertional dyspnea    Gastroparesis    GERD (gastroesophageal reflux disease)    mann   Heart murmur    "think I outgrew it"   Hepatic steatosis    History of kidney stones    History of stomach ulcers    Hx: UTI (urinary tract infection)    Hyperlipidemia    Hypersensitivity    in tongue   Hypertension    IBS (irritable bowel syndrome)    Insomnia    Internal hemorrhoids  Kidney cysts    RT KIDNEY   Kidney stones 2009   s/p lithotripsy   Mild CAD    Mild valvular heart disease    Nephrolithiasis 04/13/2014   Obesity    PFO (patent foramen ovale)    PONV (postoperative nausea and vomiting)    Pulmonary nodule    Shortness of breath    unable to lie flat   Ventral hernia     PAST SURGICAL HISTORY: Past Surgical History:  Procedure Laterality Date   ABDOMINAL HYSTERECTOMY  2002   APPENDECTOMY     BACK SURGERY     Can't take any medical procedure in right arm   BREAST BIOPSY     right   BREAST SURGERY N/A    [employer]reesia 04/22/2020   CATARACT EXTRACTION W/ INTRAOCULAR LENS  IMPLANT, BILATERAL  2009-2010   CESAREAN SECTION  1973; 1978; 1981   CESAREAN SECTION N/A    [employer]reesia 04/22/2020   COLONOSCOPY WITH PROPOFOL N/A 10/29/2016   Procedure: COLONOSCOPY WITH PROPOFOL;  Surgeon: West Bali, MD;  [facility]: [facility];  Service: Endoscopy;  Laterality: N/A;  10:00 am   CYSTOSCOPY Left 04/13/2014   Procedure: CYSTOSCOPY FLEXIBLE;  Surgeon: Crist Fat, MD;  Location: [facility];  Service: Urology;  Laterality: Left;  with STENT   ESOPHAGOGASTRODUODENOSCOPY (EGD) WITH PROPOFOL N/A 10/29/2016   Procedure: ESOPHAGOGASTRODUODENOSCOPY (EGD) WITH PROPOFOL;  Surgeon: West Bali, MD;  Location: [facility];  Service: Endoscopy;  Laterality: N/A;   EYE SURGERY     implant and screws put in the right lower jaw  11/2008   dental surgery   kidney blockage  ?1990's   " major surgery ;put kidney on pump for awhile"    LITHOTRIPSY     "several times"   LUMBAR FUSION  08/2010   MASTECTOMY  1997   right   NEPHROLITHOTOMY Left 04/13/2014   Procedure: LEFT PERCUTANEOUS NEPHROLITHOTOMY WITH SURGEON ACCESS;  Surgeon: Crist Fat, MD;  Location: [facility];  Service: Urology;  Laterality: Left;   OVARIAN CYST SURGERY     PARATHYROIDECTOMY  02/26/2012   Procedure: PARATHYROIDECTOMY;  Surgeon: Darletta Moll, MD;  Location: [facility] OR;  Service: ENT;;   SAVORY DILATION N/A 10/29/2016   Procedure: SAVORY DILATION;  Surgeon: West Bali, MD;  Location: [facility];  Service: Endoscopy;  Laterality: N/A;   SPINE SURGERY  2011   urological surgery for blocked ureter secondary to kidney stone      FAMILY HISTORY: Family History  Problem Relation Age of Onset   Birth defects Mother    Heart attack Mother    Cancer Brother        Esophageal; smoker; deceased 73s   Colon cancer Brother    Diabetes Daughter    Leukemia Paternal Aunt        deceased 42s   Pancreatic cancer Paternal Aunt        deceased 88   Cancer Paternal Aunt        GI cancer; deceased 24s   Cancer Cousin        kidney cancer; deceased 46; pat first cousin; daughter of aunt w/ leukemia   Cancer Cousin        colon cancer @ 47; pat first cousin; son of aunt with GI cancer   Anesthesia problems Neg Hx     SOCIAL HISTORY: Social History   Socioeconomic History   Marital status: Married    Spouse name: Not on file  Number of children: 3   Years of education: Not on file   Highest education level: Not on file  Occupational History   Occupation: retired in 2011  Tobacco Use   Smoking status: Never   Smokeless tobacco: Never  Vaping Use   Vaping status: Never Used  Substance and Sexual Activity   Alcohol use: Yes    Alcohol/week: 0.0 standard drinks of alcohol    Comment: X2 DRINKS PER YEAR   Drug use: No   Sexual activity: Not Currently    Birth control/protection: Surgical  Other Topics Concern   Not on file  Social History  Narrative   Right handed   Caffeine-2 cups daily occasionally   Social Drivers of Health   Financial Resource Strain: Low Risk  (12/15/2022)   Overall Financial Resource Strain (CARDIA)    Difficulty of Paying Living Expenses: Not hard at all  Food Insecurity: No Food Insecurity (12/15/2022)   Hunger Vital Sign    Worried About Running Out of Food in the Last Year: Never true    Ran Out of Food in the Last Year: Never true  Transportation Needs: No Transportation Needs (12/15/2022)   PRAPARE - Administrator, Civil Service (Medical): No    Lack of Transportation (Non-Medical): No  Physical Activity: Unknown (12/15/2022)   Exercise Vital Sign    Days of Exercise per Week: 0 days    Minutes of Exercise per Session: Not on file  Stress: Stress Concern Present (12/15/2022)   [employer] of Occupational Health - Occupational Stress Questionnaire    Feeling of Stress : To some extent  Social Connections: Socially Integrated (12/15/2022)   Social Connection and Isolation Panel [NHANES]    Frequency of Communication with Friends and Family: More than three times a week    Frequency of Social Gatherings with Friends and Family: Once a week    Attends Religious Services: More than 4 times per year    Active Member of [employer] or Organizations: Yes    Attends Banker Meetings: 1 to 4 times per year    Marital Status: Married  Catering manager Violence: Not on file    PHYSICAL EXAM  GENERAL EXAM/CONSTITUTIONAL: Vitals:  Vitals:   12/17/23 0946  BP: 138/85  Pulse: 84  Weight: 176 lb (79.8 kg)  Height: 5' 6.5" (1.689 m)   Body mass index is 27.98 kg/m. Wt Readings from Last 3 Encounters:  12/17/23 176 lb (79.8 kg)  11/06/23 172 lb 12.8 oz (78.4 kg)  06/17/23 171 lb (77.6 kg)   Patient is in no distress; well developed, nourished and groomed; neck is supple  MUSCULOSKELETAL: Gait, strength, tone, movements noted in Neurologic exam  below  NEUROLOGIC: MENTAL STATUS:      No data to display         awake, alert, oriented to person, place and time recent and remote memory intact normal attention and concentration language fluent, comprehension intact, naming intact fund of knowledge appropriate  CRANIAL NERVE:  2nd, 3rd, 4th, 6th - Visual fields full to confrontation, extraocular muscles intact, no nystagmus 5th - facial sensation symmetric 7th - facial strength symmetric 8th - hearing intact 9th - palate elevates symmetrically, uvula midline 11th - shoulder shrug symmetric 12th - tongue protrusion midline  MOTOR:  normal bulk and tone, full strength in the BUE, BLE  SENSORY:  normal and symmetric to light touch and pinprick, decrease vibration in the bilateral feet left greater than right. Reports numbness  and pinprick sensation to both hands   COORDINATION:  finger-nose-finger, fine finger movements normal  GAIT/STATION:  Antalgic gait, unable to tandem     DIAGNOSTIC DATA (LABS, IMAGING, TESTING) - I reviewed patient records, labs, notes, testing and imaging myself where available.  Lab Results  Component Value Date   WBC 8.7 08/11/2023   HGB 11.4 (L) 08/11/2023   HCT 35.2 (L) 08/11/2023   MCV 80.7 08/11/2023   PLT 260 08/11/2023      Component Value Date/Time   NA 144 11/03/2023 0837   K 4.1 11/03/2023 0837   CL 103 11/03/2023 0837   CO2 21 11/03/2023 0837   GLUCOSE 209 (H) 11/03/2023 0837   GLUCOSE 401 (H) 02/11/2023 1329   GLUCOSE 219 (H) 08/26/2016 1251   BUN 11 11/03/2023 0837   CREATININE 0.94 11/03/2023 0837   CREATININE 1.07 (H) 10/25/2020 1224   CALCIUM 9.4 11/03/2023 0837   CALCIUM 9.9 02/26/2012 1551   PROT 7.0 11/03/2023 0837   ALBUMIN 3.9 11/03/2023 0837   AST 16 11/03/2023 0837   ALT 12 11/03/2023 0837   ALKPHOS 75 11/03/2023 0837   BILITOT 0.3 11/03/2023 0837   GFRNONAA 50 (L) 02/11/2023 1329   GFRNONAA 68 09/07/2013 1104   GFRAA >60 11/20/2018 1229   GFRAA  78 09/07/2013 1104   Lab Results  Component Value Date   CHOL 213 (H) 11/03/2023   HDL 78 11/03/2023   LDLCALC 112 (H) 11/03/2023   TRIG 131 11/03/2023   CHOLHDL 2.7 11/03/2023   Lab Results  Component Value Date   HGBA1C 9.0 (H) 11/03/2023   Lab Results  Component Value Date   VITAMINB12 467 05/02/2023   Lab Results  Component Value Date   TSH 0.844 09/17/2022    MRI Brain 2022 No acute or reversible finding. No specific cause of the presenting symptoms is identified. Mild to moderate chronic small-vessel ischemic changes of the pons and cerebral hemispheric white matter. Old minor cortical infarction at the left posterior frontal vertex region.  CT Head 10/24/2022 Periventricular white matter changes consistent with chronic small vessel ischemia. Small chronic left frontal CVA. No acute intracranial process identified.    ASSESSMENT AND PLAN  78 y.o. year old female with history of breast cancer, s/p chemotherapy, hypertension, diabetes, who is presenting for follow up for he peripheral neuropathy. She is doing well on Duloxetine 60 mg daily and tells me that her glucose levels are not well controlled. She will continue to follow up with PCP and nutritionist for better glucose management and return as needed.    1. Diabetic autonomic neuropathy associated with type 2 diabetes mellitus [facility])      Patient Instructions  Continue current medications Continue follow-up with PCP, nutritionist Return as needed.  No orders of the defined types were placed in this encounter.   Meds ordered this encounter  Medications   DULoxetine (CYMBALTA) 60 MG capsule    Sig: Take 1 capsule (60 mg total) by mouth daily.    Dispense:  90 capsule    Refill:  3    Return if symptoms worsen or fail to improve.    Windell Norfolk, MD 12/17/2023, 10:53 AM  [facility] [address], [address] [place], [place] [address] [phone]

## 2023-12-22 ENCOUNTER — Ambulatory Visit: Payer: Self-pay

## 2023-12-22 NOTE — Telephone Encounter (Signed)
 Copied from CRM [id number]. Topic: Clinical - Red Word Triage >> Dec 22, 2023  3:49 PM Geroge Baseman wrote: Red Word that prompted transfer to Nurse Triage: Patients blood sugar is 380  Chief Complaint: elevated bgl Symptoms: see above Frequency: constant Pertinent Negatives: Patient denies flank pain, numbness,  Disposition: [] ED /[] Urgent Care (no appt availability in office) / [x] Appointment(In office/virtual)/ []  [place] [facility]/ [] Home Care/ [] Refused Recommended Disposition /[] [place] [facility]/ []  Follow-up with PCP Additional Notes: apt made for this week.  Care advice given, denies questions; instructed to go to ER if becomes worse.    Reason for Disposition  [1] Blood glucose > 240 mg/dL (04.5 mmol/L) AND [4] pregnant  Answer Assessment - Initial Assessment Questions 1. BLOOD GLUCOSE: "What is your blood glucose level?"      380 2. ONSET: "When did you check the blood glucose?"     today 3. USUAL RANGE: "What is your glucose level usually?" (e.g., usual fasting morning value, usual evening value)     200-250 normally 4. KETONES: "Do you check for ketones (urine or blood test strips)?" If Yes, ask: "What does the test show now?"      denies 5. TYPE 1 or 2:  "Do you know what type of diabetes you have?"  (e.g., Type 1, Type 2, Gestational; doesn't know)      Type 2 6. INSULIN: "Do you take insulin?" "What type of insulin(s) do you use? What is the mode of delivery? (syringe, pen; injection or pump)?"      Yes, lantus 7. DIABETES PILLS: "Do you take any pills for your diabetes?" If Yes, ask: "Have you missed taking any pills recently?"     Metformin and glipizide 8. OTHER SYMPTOMS: "Do you have any symptoms?" (e.g., fever, frequent urination, difficulty breathing, dizziness, weakness, vomiting)     Dizziness, weak 9. PREGNANCY: "Is there any chance you are pregnant?" "When was your last menstrual period?"     na  Protocols used: Diabetes - High Blood Sugar-A-AH

## 2023-12-24 ENCOUNTER — Other Ambulatory Visit: Payer: Self-pay | Admitting: Internal Medicine

## 2023-12-25 ENCOUNTER — Ambulatory Visit

## 2023-12-25 DIAGNOSIS — E1165 Type 2 diabetes mellitus with hyperglycemia: Secondary | ICD-10-CM | POA: Diagnosis not present

## 2023-12-25 MED ORDER — LANTUS SOLOSTAR 100 UNIT/ML ~~LOC~~ SOPN
15.0000 [IU] | PEN_INJECTOR | Freq: Two times a day (BID) | SUBCUTANEOUS | 2 refills | Status: DC
Start: 2023-12-25 — End: 2024-02-06

## 2023-12-25 NOTE — Patient Instructions (Signed)
 Increase Lantus to two times a day at 15 units each.  Continue with 12pm dose and add the other at bedtime.    Referral placed for endocrinologist here in [place].  Information for ENT referral provided.

## 2023-12-25 NOTE — Assessment & Plan Note (Signed)
 She was seen a month ago by Dr. Allena Katz.  Her blood sugars were improving at that time, but still not at goal.  He had increased her Lantus to 15 units and had advised her to take this in the morning, after breakfast.  She states that her blood sugars are remaining in the 300s according to CGM.  She did not have any of her medicines this morning and has not eaten.  She was not sure if we were going to do blood work or not. I have advised her to increase her Lantus to twice a day at 15 units.  15 units in the morning, after breakfast, then 15 units at bedtime. Continue with glipizide 10 mg BID and Metformin 500 XR mg QD as she had diarrhea with regular Metformin.  Did not tolerate Jardiance, Januvia and GLP-1 agonist in the past  I have referred her to endocrinology for further evaluation and treatment.  Recommend calling dietitian to schedule appointment as well.   Diabetic eye exam: Advised to follow up with Ophthalmology for diabetic eye exam

## 2023-12-25 NOTE — Progress Notes (Signed)
 Established Patient Office Visit  Subjective   Patient ID: [name], female    DOB: [birth year]  Age: 78 y.o. MRN: [id number]  Chief Complaint  Patient presents with   Blood Sugar Problem    Pt. States her "blood sugar is staying in the 300s and never below" Pt also is "dizzy and fatigued"  Blood sugar right now @10 :22am is 366 as her freestyle libre says. Hasn't been in the "100s since Last month"    Diabetes She presents for her follow-up diabetic visit. She has type 2 diabetes mellitus. No MedicAlert identification noted. Her disease course has been worsening. There are no hypoglycemic associated symptoms. Associated symptoms include weakness. (Headache ) There are no hypoglycemic complications. Symptoms are worsening. Risk factors for coronary artery disease include sedentary lifestyle. Current diabetic treatment includes oral agent (dual therapy) and insulin injections. She is compliant with treatment most of the time. She is currently taking insulin pre-lunch. Insulin injections are given by patient. Rotation sites for injection include the abdominal wall. Her weight is stable. She is following a generally healthy diet. When asked about meal planning, she reported none. She has not had a previous visit with a dietitian (she has been referred to a nutritionist). She rarely participates in exercise. Her home blood glucose trend is increasing steadily. Her overall blood glucose range is >200 mg/dl. An ACE inhibitor/angiotensin II receptor blocker is not being taken. She does not see a podiatrist.   Patient Active Problem List   Diagnosis Date Noted   Cyst of left parotid gland 12/01/2023   Neck mass 11/06/2023   Gait abnormality 12/19/2022   Peripheral neuropathy 09/17/2022   Right foot pain 05/02/2022   Hypokalemia 05/02/2022   Overactive bladder 02/08/2022   Left knee pain 02/08/2022   Right leg swelling 02/08/2022   Encounter for general adult medical examination with  abnormal findings 08/30/2021   Chronic fatigue 08/30/2021   Vitamin B12 deficiency 08/30/2021   Statin myopathy 04/18/2021   Thyroid nodule 01/16/2018   Overweight (BMI 25.0-29.9) 12/18/2017   Diabetic neuropathy (HCC) 10/13/2017   Genetic testing 11/28/2016   Esophageal dysphagia 09/30/2016   Ventral hernia 07/28/2015   Situational anxiety 04/26/2015   Thyromegaly 01/23/2015   Iron deficiency 06/17/2014   DDD (degenerative disc disease), lumbar 01/26/2014   Bulge of lumbar disc without myelopathy 01/26/2014   Renal cyst 01/26/2014   Back pain with left-sided sciatica 12/28/2013   Uncontrolled type 2 diabetes mellitus with hyperglycemia, without long-term current use of insulin (HCC) 08/01/2013   GASTROPARESIS 12/15/2008   GERD (gastroesophageal reflux disease) 06/21/2008   IBS (irritable bowel syndrome) 06/21/2008   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 (HCC) 02/09/2008   Hyperlipidemia 02/09/2008   Essential hypertension 02/09/2008   Insomnia 02/09/2008      Review of Systems  Neurological:  Positive for weakness.      Objective:     BP 123/78 (BP Location: Left Arm, Patient Position: Sitting, Cuff Size: Normal)   Pulse (!) 110   Ht 5\' 6"  (1.676 m)   SpO2 95%   BMI 28.41 kg/m  BP Readings from Last 3 Encounters:  12/25/23 123/78  12/17/23 138/85  11/06/23 132/70     Physical Exam Vitals and nursing note reviewed.  Constitutional:      General: She is in acute distress (pt is experiencing HA and unsteadiness).  HENT:     Head: Normocephalic.  Cardiovascular:     Rate and Rhythm: Normal rate and regular rhythm.  Pulmonary:  Effort: Pulmonary effort is normal.     Breath sounds: Normal breath sounds.  Musculoskeletal:     Cervical back: Normal range of motion and neck supple.  Neurological:     Mental Status: She is alert and oriented to person, place, and time.     Motor: Weakness present.     Gait: Gait abnormal.  Psychiatric:        Mood and  Affect: Mood normal.        Thought Content: Thought content normal.      No results found for any visits on 12/25/23.  Last hemoglobin A1c Lab Results  Component Value Date   HGBA1C 9.0 (H) 11/03/2023      The 10-year ASCVD risk score (Arnett DK, et al., 2019) is: 38.9%    Assessment & Plan:   Problem List Items Addressed This Visit       Endocrine   Uncontrolled type 2 diabetes mellitus with hyperglycemia, without long-term current use of insulin (HCC)   She was seen a month ago by Dr. Allena Katz.  Her blood sugars were improving at that time, but still not at goal.  He had increased her Lantus to 15 units and had advised her to take this in the morning, after breakfast.  She states that her blood sugars are remaining in the 300s according to CGM.  She did not have any of her medicines this morning and has not eaten.  She was not sure if we were going to do blood work or not. I have advised her to increase her Lantus to twice a day at 15 units.  15 units in the morning, after breakfast, then 15 units at bedtime. Continue with glipizide 10 mg BID and Metformin 500 XR mg QD as she had diarrhea with regular Metformin.  Did not tolerate Jardiance, Januvia and GLP-1 agonist in the past  I have referred her to endocrinology for further evaluation and treatment.  Recommend calling dietitian to schedule appointment as well.   Diabetic eye exam: Advised to follow up with Ophthalmology for diabetic eye exam      Relevant Medications   insulin glargine (LANTUS SOLOSTAR) 100 UNIT/ML Solostar Pen   Other Relevant Orders   Ambulatory referral to Endocrinology    No follow-ups on file.    Darral Dash, FNP

## 2023-12-30 ENCOUNTER — Other Ambulatory Visit: Payer: Self-pay | Admitting: Physician Assistant

## 2023-12-30 DIAGNOSIS — C50919 Malignant neoplasm of unspecified site of unspecified female breast: Secondary | ICD-10-CM

## 2023-12-30 DIAGNOSIS — M8580 Other specified disorders of bone density and structure, unspecified site: Secondary | ICD-10-CM

## 2023-12-30 DIAGNOSIS — D509 Iron deficiency anemia, unspecified: Secondary | ICD-10-CM

## 2023-12-31 ENCOUNTER — Inpatient Hospital Stay: Attending: Hematology

## 2023-12-31 DIAGNOSIS — Z800 Family history of malignant neoplasm of digestive organs: Secondary | ICD-10-CM | POA: Diagnosis not present

## 2023-12-31 DIAGNOSIS — E559 Vitamin D deficiency, unspecified: Secondary | ICD-10-CM | POA: Insufficient documentation

## 2023-12-31 DIAGNOSIS — Z9221 Personal history of antineoplastic chemotherapy: Secondary | ICD-10-CM | POA: Diagnosis not present

## 2023-12-31 DIAGNOSIS — N1831 Chronic kidney disease, stage 3a: Secondary | ICD-10-CM | POA: Insufficient documentation

## 2023-12-31 DIAGNOSIS — D631 Anemia in chronic kidney disease: Secondary | ICD-10-CM | POA: Diagnosis not present

## 2023-12-31 DIAGNOSIS — M8580 Other specified disorders of bone density and structure, unspecified site: Secondary | ICD-10-CM | POA: Diagnosis not present

## 2023-12-31 DIAGNOSIS — N644 Mastodynia: Secondary | ICD-10-CM | POA: Insufficient documentation

## 2023-12-31 DIAGNOSIS — Z806 Family history of leukemia: Secondary | ICD-10-CM | POA: Insufficient documentation

## 2023-12-31 DIAGNOSIS — D509 Iron deficiency anemia, unspecified: Secondary | ICD-10-CM | POA: Diagnosis not present

## 2023-12-31 DIAGNOSIS — R229 Localized swelling, mass and lump, unspecified: Secondary | ICD-10-CM | POA: Diagnosis present

## 2023-12-31 DIAGNOSIS — Z853 Personal history of malignant neoplasm of breast: Secondary | ICD-10-CM | POA: Diagnosis not present

## 2023-12-31 DIAGNOSIS — C50919 Malignant neoplasm of unspecified site of unspecified female breast: Secondary | ICD-10-CM

## 2023-12-31 DIAGNOSIS — Z79899 Other long term (current) drug therapy: Secondary | ICD-10-CM | POA: Insufficient documentation

## 2023-12-31 LAB — COMPREHENSIVE METABOLIC PANEL WITH GFR
ALT: 14 U/L (ref 0–44)
AST: 18 U/L (ref 15–41)
Albumin: 3.4 g/dL — ABNORMAL LOW (ref 3.5–5.0)
Alkaline Phosphatase: 58 U/L (ref 38–126)
Anion gap: 12 (ref 5–15)
BUN: 17 mg/dL (ref 8–23)
CO2: 24 mmol/L (ref 22–32)
Calcium: 9 mg/dL (ref 8.9–10.3)
Chloride: 103 mmol/L (ref 98–111)
Creatinine, Ser: 1.11 mg/dL — ABNORMAL HIGH (ref 0.44–1.00)
GFR, Estimated: 52 mL/min — ABNORMAL LOW (ref >60)
Glucose, Bld: 210 mg/dL — ABNORMAL HIGH (ref 70–99)
Potassium: 3.9 mmol/L (ref 3.5–5.1)
Sodium: 139 mmol/L (ref 135–145)
Total Bilirubin: 0.5 mg/dL (ref 0.0–1.2)
Total Protein: 7.2 g/dL (ref 6.5–8.1)

## 2023-12-31 LAB — CBC WITH DIFFERENTIAL/PLATELET
Abs Immature Granulocytes: 0.02 10*3/uL (ref 0.00–0.07)
Basophils Absolute: 0.1 10*3/uL (ref 0.0–0.1)
Basophils Relative: 1 %
Eosinophils Absolute: 0.3 10*3/uL (ref 0.0–0.5)
Eosinophils Relative: 4 %
HCT: 35.3 % — ABNORMAL LOW (ref 36.0–46.0)
Hemoglobin: 11.2 g/dL — ABNORMAL LOW (ref 12.0–15.0)
Immature Granulocytes: 0 %
Lymphocytes Relative: 31 %
Lymphs Abs: 2.9 10*3/uL (ref 0.7–4.0)
MCH: 26.2 pg (ref 26.0–34.0)
MCHC: 31.7 g/dL (ref 30.0–36.0)
MCV: 82.7 fL (ref 80.0–100.0)
Monocytes Absolute: 0.7 10*3/uL (ref 0.1–1.0)
Monocytes Relative: 8 %
Neutro Abs: 5.3 10*3/uL (ref 1.7–7.7)
Neutrophils Relative %: 56 %
Platelets: 272 10*3/uL (ref 150–400)
RBC: 4.27 MIL/uL (ref 3.87–5.11)
RDW: 14.9 % (ref 11.5–15.5)
WBC: 9.4 10*3/uL (ref 4.0–10.5)
nRBC: 0 % (ref 0.0–0.2)

## 2023-12-31 LAB — IRON AND TIBC
Iron: 53 ug/dL (ref 28–170)
Saturation Ratios: 15 % (ref 10.4–31.8)
TIBC: 344 ug/dL (ref 250–450)
UIBC: 291 ug/dL

## 2023-12-31 LAB — VITAMIN D 25 HYDROXY (VIT D DEFICIENCY, FRACTURES): Vit D, 25-Hydroxy: 23.16 ng/mL — ABNORMAL LOW (ref 30–100)

## 2023-12-31 LAB — FERRITIN: Ferritin: 29 ng/mL (ref 11–307)

## 2024-01-02 NOTE — Progress Notes (Signed)
 [facility] [address], [place] [address]   CLINIC:  Medical Oncology/Hematology  PCP:  Meldon Sport, MD [address] / [place] [address] [phone]   CHIEF COMPLIANT: Follow-up for stage IA (T1cN0M0) right breast cancer, ER+/PR+ (diagnosed in 1997) + iron deficiency anemia  BRIEF ONCOLOGIC HISTORY:   Oncology History  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 (HCC)  02/09/2008 Initial Diagnosis   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 (HCC)   07/06/2014 Genetic Testing   Testing was normal and did not reveal a mutation in these genes. The genes tested were ATM, BARD1, BRCA1, BRCA2, BRIP1, CDH1, CHEK2, MRE11A, MUTYH, NBN, NF1, PALB2, PTEN, RAD50, RAD51C, RAD51D, and TP53.    CANCER STAGING:  Cancer Staging  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 [facility]) Staging form: Breast, AJCC 6th Edition - Clinical: Stage I (T1c, N0, M0) - Signed by Doretta Gant, PA-C on 05/21/2013   INTERVAL HISTORY:   [name], a 78 y.o. female, returns for routine follow-up of her history of right-sided breast cancer, which was diagnosed in 28. Chaelyn was last seen on 02/18/2023 by Sheril Dines PA-C.   At today's visit, she  reports feeling fair, but is concerned regarding the various symptoms addressed below.  She reports 50% energy and 75% appetite.  She is maintaining stable weight at this time.  LEFT SUPERIOR NECK MASS: Patient reports waxing and waning nodule of left upper neck, just posterior/inferior to her ear, present x 2 years.  She reports that this is sometimes painful and tender.  She denies any other masses or lymphadenopathy.  She is a lifelong non-smoker.  PCP ordered US  of left superior neck (11/14/2023), which showed 2 intraparotid structures, predominantly cystic, largest measuring 1.9 x 0.9 x 1.2 cm, suspicious for necrotic lymph node.  Previous CT from 11/10/2020 demonstrated 2 similarly sized masses, suggesting chronic process such as  Warthin's tumor or inflammatory lymph nodes which are now necrotic.  Further evaluation with fine-needle aspiration was recommended, and patient was instructed to follow-up with her ENT (Dr. Darlin Ehrlich), but this has not yet been arranged.  HISTORY OF BREAST CANCER with NEW BREAST TENDERNESS: Patient has not noticed any new breast nodules or masses, but reports scattered areas of left breast tenderness x 2 months.  She also reports none drenching night sweats x 2 months, but denies any unexplained fever, chills, or weight loss.  She has some ongoing aching in her right arm with intermittent lymphedema.  She reports new headaches for the past few months, but denies any seizures or focal neurologic deficits.  She has abdominal discomfort from her hernia.   IRON DEFICIENCY ANEMIA:  She denies any frank melena or hematochezia, but does have intermittent "dark blackish bowel movements that are hard and compact."   She admits to fatigue, dyspnea on exertion, and lightheadedness without syncope.  She denies any chest pain.  She had not been taking iron tablet for the past 6 months, but restarted taking it a few weeks ago.  She takes daily vitamin D, but is uncertain of the dose.   ASSESSMENT & PLAN:  1.  Stage Ia (T1cN0M0) right breast cancer, ER positive/PR positive: - Patient was diagnosed with right breast cancer in 1997. - Right breast cancer measuring 1.1 x 1.2 cm, grade 2, status post right mastectomy with regional axillary lymph node dissection on 10/17/1995; all 8 axillary lymph nodes were negative for metastatic disease. - She had adjuvant chemotherapy consisting of Adriamycin/Cytoxan x4  cycles. - Antiestrogen therapy with tamoxifen x5 years was completed in July 2002. - Most recent LEFT breast mammogram (02/26/2023): BI-RADS Category 1, negative - Most recent labs (12/31/2023): Baseline CBC and CMP.  LFTs are normal, and she has baseline mild CKD stage IIIa with GFR 52/creatinine 1.14. - Patient reports left  breast tenderness x 2 months, as per visit today 01/05/24  - Physical exam today (01/05/24) showed right mastectomy site within normal limits.  No masses or lymphadenopathy in left breast or bilateral axilla. - PLAN: We will check diagnostic mammogram of left breast due to new onset tenderness.  2.  Left superior neck mass (? necrotic lymph node) - Patient reports waxing and waning nodule of left upper neck, just posterior/inferior to her ear, present x 2 years.  She reports that this is sometimes painful and tender. - Lifelong non-smoker. - Ultrasound of left superior neck (11/14/2023, ordered per PCP): Presence of 2 intraparotid structures, predominantly cystic, largest measuring 1.9 x 0.9 x 1.2 cm, suspicious for necrotic lymph node.  Previous CT from 11/10/2020 demonstrated 2 similarly sized masses, suggesting chronic process such as Warthin's tumor or inflammatory lymph nodes which are now necrotic. - Further evaluation with fine-needle aspiration was recommended, and patient was instructed to follow-up with her ENT (Dr. Darlin Ehrlich), but this has not yet been arranged. - PLAN: Recommend ENT follow-up as soon as possible for fine-needle aspiration.  We will send referral to help facilitate this.  3.  Iron deficiency anemia + anemia CKD - Labs from 02/11/2023 show mild iron deficiency anemia, Hgb 11.3/MCV 80, ferritin 17, iron saturation 11%. - Colonoscopy (10/29/2016): Diverticulosis, internal hemorrhoids - EGD (10/29/2016): Dysphagia due to GERD/peptic stricture, mild gastritis - Per last visit with Dr. Cheree Cords, patient was instructed to take iron tablets 3 times weekly (since May 2023).  Patient stopped taking these about 6 months ago - She reports dark bowel movements when constipated, but denies any gross hematochezia or melena. - She reports fatigue, pica, DOE, and lightheadedness - Most recent labs (12/31/2023): Hgb 11.2/MCV 82.7, ferritin 29, iron saturation 15%.  CMP shows baseline CKD stage  IIIa - PLAN: Check stool cards x 3. - Restart oral iron every other day, along with stool softener (Rx to pharmacy) - Repeat CBC with iron panel + OFFICE visit in 6 months   4.  Osteopenia:. - DEXA scan on 02/08/2021 T score -1.7, osteopenia. - Bone density/DEXA (02/26/2023): T-score -1.5, osteopenia - Labs from 12/31/2023 show mildly low vitamin D at 23.16, normal calcium 9.0 - She is taking daily vitamin D, but uncertain of the dose. - PLAN: Rx to pharmacy for Vitamin D 2,000 units daily.  Continue calcium and weightbearing exercises.  - Recheck in 1 year  PLAN SUMMARY:  >> Please enter referral to ENT (previously seen by Dr. Darlin Ehrlich) - needs fine-needle aspiration of necrotic lymph node in left upper neck >> Diagnostic mammogram and US  of left breast >> Stool cards x 3 >> Rx to pharmacy for iron tablet, stool softener, and vitamin D >> Labs in 6 months = CBC/D, ferritin, iron/TIBC, vitamin D >> OFFICE visit in 6 months (after labs)    REVIEW OF SYSTEMS:  Review of Systems  Constitutional:  Positive for fatigue. Negative for appetite change, chills, diaphoresis, fever and unexpected weight change.  HENT:   Negative for lump/mass and nosebleeds.   Eyes:  Negative for eye problems.  Respiratory:  Positive for chest tightness, cough and shortness of breath. Negative for hemoptysis.   Cardiovascular:  Negative for chest pain, leg swelling and palpitations.  Gastrointestinal:  Negative for abdominal pain, blood in stool, constipation, diarrhea, nausea and vomiting.  Genitourinary:  Negative for bladder incontinence and hematuria.   Musculoskeletal:  Positive for arthralgias and myalgias.  Skin: Negative.   Neurological:  Positive for dizziness, headaches, light-headedness and numbness.  Hematological:  Does not bruise/bleed easily.  Psychiatric/Behavioral:  Positive for sleep disturbance. The patient is nervous/anxious.     PHYSICAL EXAM:   Performance status (ECOG): 1 - Symptomatic but  completely ambulatory  Vitals:   01/05/24 0910  BP: (!) 148/90  Pulse: 100  Resp: 19  Temp: (!) 97 F (36.1 C)  SpO2: 98%   Wt Readings from Last 3 Encounters:  01/05/24 177 lb (80.3 kg)  12/17/23 176 lb (79.8 kg)  11/06/23 172 lb 12.8 oz (78.4 kg)   Physical Exam Constitutional:      Appearance: Normal appearance. She is obese.  Neck:   Cardiovascular:     Rate and Rhythm: Tachycardia present.     Heart sounds: Murmur heard.  Pulmonary:     Breath sounds: Normal breath sounds.  Chest:  Breasts:    Right: Absent.     Left: Tenderness present.       Comments: S/p right mastectomy. Left breast without any nodules or masses. No supraclavicular, axillary, or pectoral lymphadenopathy bilaterally. Lymphadenopathy:     Head:     Left side of head: Posterior auricular adenopathy present.  Neurological:     General: No focal deficit present.     Mental Status: Mental status is at baseline.  Psychiatric:        Behavior: Behavior normal. Behavior is cooperative.      PAST MEDICAL/SURGICAL HISTORY:  Past Medical History:  Diagnosis Date   (HFpEF) heart failure with preserved ejection fraction (HCC)    Abdominal hernia 02/26/2012   unrepaired   Adenocarcinoma of breast (HCC) 1997   right / chemo + tamoxifen x 5 years    Anemia    Anxiety    Aortic atherosclerosis (HCC)    Arthritis    Blood transfusion 1980's   Breast cancer (HCC)    Cancer (HCC)    Phreesia 08/18/2020   Complication of anesthesia    has a hard time waking up; can't lay flat   Degenerative disc disease, lumbar    pressing on L3 and L4   Diabetes mellitus (HCC) 1989   Type 2 NIDDM; "cancer treatment gave me diabetes"   Diabetes mellitus without complication (HCC)    Phreesia 08/18/2020   Diverticulosis    DJD (degenerative joint disease) of lumbar spine    Dysrhythmia    "irregular"   Exertional dyspnea    Gastroparesis    GERD (gastroesophageal reflux disease)    mann   Heart murmur     "think I outgrew it"   Hepatic steatosis    History of kidney stones    History of stomach ulcers    Hx: UTI (urinary tract infection)    Hyperlipidemia    Hypersensitivity    in tongue   Hypertension    IBS (irritable bowel syndrome)    Insomnia    Internal hemorrhoids    Kidney cysts    RT KIDNEY   Kidney stones 2009   s/p lithotripsy   Mild CAD    Mild valvular heart disease    Nephrolithiasis 04/13/2014   Obesity    PFO (patent foramen ovale)    PONV (postoperative nausea and  vomiting)    Pulmonary nodule    Shortness of breath    unable to lie flat   Ventral hernia    Past Surgical History:  Procedure Laterality Date   ABDOMINAL HYSTERECTOMY  2002   APPENDECTOMY     BACK SURGERY     Can't take any medical procedure in right arm   BREAST BIOPSY     right   BREAST SURGERY N/A    Phreesia 04/22/2020   CATARACT EXTRACTION W/ INTRAOCULAR LENS  IMPLANT, BILATERAL  2009-2010   CESAREAN SECTION  1973; 1978; 1981   CESAREAN SECTION N/A    Phreesia 04/22/2020   COLONOSCOPY WITH PROPOFOL N/A 10/29/2016   Procedure: COLONOSCOPY WITH PROPOFOL;  Surgeon: Alyce Jubilee, MD;  [facility];  Service: Endoscopy;  Laterality: N/A;  10:00 am   CYSTOSCOPY Left 04/13/2014   Procedure: CYSTOSCOPY FLEXIBLE;  Surgeon: Andrez Banker, MD;  Location: [facility];  Service: Urology;  Laterality: Left;  with STENT   ESOPHAGOGASTRODUODENOSCOPY (EGD) WITH PROPOFOL N/A 10/29/2016   Procedure: ESOPHAGOGASTRODUODENOSCOPY (EGD) WITH PROPOFOL;  Surgeon: Alyce Jubilee, MD;  Location: [facility];  Service: Endoscopy;  Laterality: N/A;   EYE SURGERY     implant and screws put in the right lower jaw  11/2008   dental surgery   kidney blockage  ?1990's   " major surgery ;put kidney on pump for awhile"   LITHOTRIPSY     "several times"   LUMBAR FUSION  08/2010   MASTECTOMY  1997   right   NEPHROLITHOTOMY Left 04/13/2014   Procedure: LEFT PERCUTANEOUS NEPHROLITHOTOMY WITH SURGEON  ACCESS;  Surgeon: Andrez Banker, MD;  Location: [facility];  Service: Urology;  Laterality: Left;   OVARIAN CYST SURGERY     PARATHYROIDECTOMY  02/26/2012   Procedure: PARATHYROIDECTOMY;  Surgeon: Lawence Press, MD;  Location: [facility] OR;  Service: ENT;;   SAVORY DILATION N/A 10/29/2016   Procedure: SAVORY DILATION;  Surgeon: Alyce Jubilee, MD;  Location: [facility];  Service: Endoscopy;  Laterality: N/A;   SPINE SURGERY  2011   urological surgery for blocked ureter secondary to kidney stone      SOCIAL HISTORY:  Social History   Socioeconomic History   Marital status: Married    Spouse name: Not on file   Number of children: 3   Years of education: Not on file   Highest education level: Bachelor's degree (e.g., BA, AB, BS)  Occupational History   Occupation: retired in 2011  Tobacco Use   Smoking status: Never   Smokeless tobacco: Never  Vaping Use   Vaping status: Never Used  Substance and Sexual Activity   Alcohol use: Yes    Alcohol/week: 0.0 standard drinks of alcohol    Comment: X2 DRINKS PER YEAR   Drug use: No   Sexual activity: Not Currently    Birth control/protection: Surgical  Other Topics Concern   Not on file  Social History Narrative   Right handed   Caffeine-2 cups daily occasionally   Social Drivers of Health   Financial Resource Strain: Low Risk  (12/23/2023)   Overall Financial Resource Strain (CARDIA)    Difficulty of Paying Living Expenses: Not hard at all  Food Insecurity: No Food Insecurity (12/23/2023)   Hunger Vital Sign    Worried About Running Out of Food in the Last Year: Never true    Ran Out of Food in the Last Year: Never true  Transportation Needs: Unknown (12/23/2023)  PRAPARE - Administrator, Civil Service (Medical): Not on file    Lack of Transportation (Non-Medical): No  Physical Activity: Unknown (12/23/2023)   Exercise Vital Sign    Days of Exercise per Week: 0 days    Minutes of Exercise per Session: Not on file  Stress:  Stress Concern Present (12/23/2023)   [employer] of Occupational Health - Occupational Stress Questionnaire    Feeling of Stress : To some extent  Social Connections: Socially Integrated (12/23/2023)   Social Connection and Isolation Panel [NHANES]    Frequency of Communication with Friends and Family: More than three times a week    Frequency of Social Gatherings with Friends and Family: More than three times a week    Attends Religious Services: More than 4 times per year    Active Member of [employer] or Organizations: Yes    Attends Engineer, structural: More than 4 times per year    Marital Status: Married  Catering manager Violence: Not on file    FAMILY HISTORY:  Family History  Problem Relation Age of Onset   Birth defects Mother    Heart attack Mother    Cancer Brother        Esophageal; smoker; deceased 31s   Colon cancer Brother    Diabetes Daughter    Leukemia Paternal Aunt        deceased 58s   Pancreatic cancer Paternal Aunt        deceased 55   Cancer Paternal Aunt        GI cancer; deceased 21s   Cancer Cousin        kidney cancer; deceased 42; pat first cousin; daughter of aunt w/ leukemia   Cancer Cousin        colon cancer @ 27; pat first cousin; son of aunt with GI cancer   Anesthesia problems Neg Hx     CURRENT MEDICATIONS:  Current Outpatient Medications  Medication Sig Dispense Refill   acetaminophen (TYLENOL) 325 MG tablet Take 325 mg by mouth every 6 (six) hours as needed for mild pain.     amLODipine (NORVASC) 5 MG tablet Take 1 tablet (5 mg total) by mouth daily. 90 tablet 3   blood glucose meter kit and supplies KIT Dispense based on patient and insurance preference. Use up to four times daily as directed. 1 each 0   Blood Glucose Monitoring Suppl (BLOOD GLUCOSE SYSTEM PAK) KIT Use as directed to monitor FSBS 1x daily. Dx: E11.9 1 each 1   Cholecalciferol (VITAMIN D) 50 MCG (2000 UT) CAPS Take 1 capsule (2,000 Units total) by mouth daily.  90 capsule 3   CINNAMON PO Take 1 capsule by mouth. Takes sometimes     Continuous Glucose Sensor (FREESTYLE LIBRE 3 SENSOR) MISC Use it to check blood glucose as instructed. 2 each 5   docusate sodium (COLACE) 50 MG capsule Take 1 capsule (50 mg total) by mouth daily as needed for mild constipation. 90 capsule 3   DULoxetine (CYMBALTA) 60 MG capsule Take 1 capsule (60 mg total) by mouth daily. 90 capsule 3   furosemide (LASIX) 40 MG tablet Take 1 tablet (40 mg total) by mouth daily as needed (FOR SWELLING). 90 tablet 0   glipiZIDE (GLUCOTROL) 10 MG tablet Take 1 tablet (10 mg total) by mouth 2 (two) times daily before a meal. 180 tablet 3   glucose blood (ACCU-CHEK GUIDE) test strip 1 each by Other route as needed for other.  Use as instructed to check blood sugar daily. 100 each 3   insulin glargine (LANTUS SOLOSTAR) 100 UNIT/ML Solostar Pen Inject 15 Units into the skin 2 (two) times daily. 15 mL 2   Insulin Pen Needle 32G X 4 MM MISC Use as directed for insulin administration. 100 each 3   LANCETS SUPER THIN 28G MISC Use to check blood sugar once daily. 100 each 3   metFORMIN (GLUCOPHAGE-XR) 500 MG 24 hr tablet TAKE ONE TABLET BY MOUTH ONCE DAILY WITH BREAKFAST. 90 tablet 0   metoprolol tartrate (LOPRESSOR) 25 MG tablet Take 1 tablet (25 mg total) by mouth 2 (two) times daily. 30 tablet 0   Multiple Vitamin (MULTIVITAMIN) tablet Take 1 tablet by mouth daily.     potassium chloride (KLOR-CON) 10 MEQ tablet Take 1 tablet (10 mEq total) by mouth daily. 90 tablet 1   ferrous sulfate 325 (65 FE) MG EC tablet Take 1 tablet (325 mg total) by mouth every other day. 90 tablet 3   No current facility-administered medications for this visit.    ALLERGIES:  Allergies  Allergen Reactions   Demerol [Meperidine] Other (See Comments)    Lost consciousness   Bentyl [Dicyclomine Hcl] Swelling   Invokana [Canagliflozin] Hives   Metformin And Related Diarrhea   Protonix [Pantoprazole Sodium] Swelling    Statins Other (See Comments)    myalgia   Ace Inhibitors Cough    LABORATORY DATA:  I have reviewed the labs as listed.     Latest Ref Rng & Units 12/31/2023    9:37 AM 08/11/2023    8:08 AM 02/11/2023    1:29 PM  CBC  WBC 4.0 - 10.5 K/uL 9.4  8.7  8.6   Hemoglobin 12.0 - 15.0 g/dL 16.1  09.6  04.5   Hematocrit 36.0 - 46.0 % 35.3  35.2  35.1   Platelets 150 - 400 K/uL 272  260  241       Latest Ref Rng & Units 12/31/2023    9:37 AM 11/03/2023    8:37 AM 05/02/2023   10:58 AM  CMP  Glucose 70 - 99 mg/dL 409  811  914   BUN 8 - 23 mg/dL 17  11  9    Creatinine 0.44 - 1.00 mg/dL 7.82  9.56  2.13   Sodium 135 - 145 mmol/L 139  144  141   Potassium 3.5 - 5.1 mmol/L 3.9  4.1  3.6   Chloride 98 - 111 mmol/L 103  103  99   CO2 22 - 32 mmol/L 24  21  25    Calcium 8.9 - 10.3 mg/dL 9.0  9.4  9.5   Total Protein 6.5 - 8.1 g/dL 7.2  7.0  6.9   Total Bilirubin 0.0 - 1.2 mg/dL 0.5  0.3  0.4   Alkaline Phos 38 - 126 U/L 58  75  71   AST 15 - 41 U/L 18  16  20    ALT 0 - 44 U/L 14  12  19      DIAGNOSTIC IMAGING:  I have independently reviewed the scans and discussed with the patient. No results found.   WRAP UP:  All questions were answered. The patient knows to call the clinic with any problems, questions or concerns.  Medical decision making: Moderate  Time spent on visit: I spent 25 minutes counseling the patient face to face. The total time spent in the appointment was 40 minutes and more than 50% was on counseling.  Anola Basques  Tonette Franco, PA-C  01/05/24 10:17 AM

## 2024-01-05 ENCOUNTER — Inpatient Hospital Stay: Admitting: Physician Assistant

## 2024-01-05 VITALS — BP 148/90 | HR 100 | Temp 97.0°F | Resp 19 | Ht 66.5 in | Wt 177.0 lb

## 2024-01-05 DIAGNOSIS — K5903 Drug induced constipation: Secondary | ICD-10-CM

## 2024-01-05 DIAGNOSIS — C50919 Malignant neoplasm of unspecified site of unspecified female breast: Secondary | ICD-10-CM

## 2024-01-05 DIAGNOSIS — D509 Iron deficiency anemia, unspecified: Secondary | ICD-10-CM | POA: Diagnosis not present

## 2024-01-05 DIAGNOSIS — E559 Vitamin D deficiency, unspecified: Secondary | ICD-10-CM

## 2024-01-05 DIAGNOSIS — R229 Localized swelling, mass and lump, unspecified: Secondary | ICD-10-CM | POA: Diagnosis not present

## 2024-01-05 DIAGNOSIS — M8580 Other specified disorders of bone density and structure, unspecified site: Secondary | ICD-10-CM

## 2024-01-05 DIAGNOSIS — Z170 Estrogen receptor positive status [ER+]: Secondary | ICD-10-CM

## 2024-01-05 MED ORDER — DOCUSATE SODIUM 50 MG PO CAPS: 50.0000 mg | ORAL_CAPSULE | Freq: Every day | ORAL | 3 refills | Status: DC | PRN

## 2024-01-05 MED ORDER — FERROUS SULFATE 325 (65 FE) MG PO TBEC: 325.0000 mg | DELAYED_RELEASE_TABLET | ORAL | 3 refills | Status: DC

## 2024-01-05 MED ORDER — VITAMIN D 50 MCG (2000 UT) PO CAPS
2000.0000 [IU] | ORAL_CAPSULE | Freq: Every day | ORAL | 3 refills | Status: DC
Start: 2024-01-05 — End: 2024-07-06

## 2024-01-05 NOTE — Patient Instructions (Addendum)
 [facility] at [facility] **VISIT SUMMARY & IMPORTANT INSTRUCTIONS **   You were seen today by Sheril Dines PA-C for your history of breast cancer.    HISTORY OF BREAST CANCER You did not have any evidence of returning breast cancer on your labs or physical exam today, but we will check left breast mammogram due to your new tenderness.  OSTEOPENIA & VITAMIN D DEFICIENCY Continue to take your calcium and vitamin D supplement to improve the strength of your bones. Prescription has been sent to your pharmacy for vitamin D 2000 units daily.  IRON DEFICIENCY ANEMIA Your blood and iron levels are lower than they should be. We will check stool cards x 3 to make sure you do not have any blood in your bowel movements. You will need to restart your iron supplement so that you are taking it EVERY OTHER DAY.  If your iron pill causes constipation, you can take daily stool softener. We will recheck your iron levels and see you for follow-up visit in 6 months.  LEFT UPPER NECK MASS: Follow-up with ENT (Dr. Darlin Ehrlich) as soon as possible for biopsy of the spot on your left neck, near your left ear.  FOLLOW-UP APPOINTMENT: Office visit in 6 months, with labs done 1 week before  ** Thank you for trusting me with your healthcare!  I strive to provide all of my patients with quality care at each visit.  If you receive a survey for this visit, I would be so grateful to you for taking the time to provide feedback.  Thank you in advance!  ~ Briahnna Harries                   Dr. Paulett Boros   &   Sheril Dines, PA-C   - - - - - - - - - - - - - - - - - -    Thank you for choosing [facility] at [facility] to provide your oncology and hematology care.  To afford each patient quality time with our provider, please arrive at least 15 minutes before your scheduled appointment time.   If you have a lab appointment with the Cancer Center please come in thru the  Main Entrance and check in at the main information desk.  You need to re-schedule your appointment should you arrive 10 or more minutes late.  We strive to give you quality time with our providers, and arriving late affects you and other patients whose appointments are after yours.  Also, if you no show three or more times for appointments you may be dismissed from the clinic at the providers discretion.     Again, thank you for choosing [facility].  Our hope is that these requests will decrease the amount of time that you wait before being seen by our physicians.       _____________________________________________________________  Should you have questions after your visit to [facility], please contact our office at [phone] and follow the prompts.  Our office hours are 8:00 a.m. and 4:30 p.m. Monday - Friday.  Please note that voicemails left after 4:00 p.m. may not be returned until the following business day.  We are closed weekends and major holidays.  You do have access to a nurse 24-7, just call the main number to the clinic [phone] and do not press any options, hold on the line and a nurse will answer the phone.  For prescription refill requests, have your pharmacy contact our office and allow 72 hours.

## 2024-01-16 ENCOUNTER — Other Ambulatory Visit: Payer: Self-pay | Admitting: Internal Medicine

## 2024-01-16 ENCOUNTER — Other Ambulatory Visit: Payer: Self-pay | Admitting: *Deleted

## 2024-01-16 DIAGNOSIS — R229 Localized swelling, mass and lump, unspecified: Secondary | ICD-10-CM | POA: Diagnosis not present

## 2024-01-16 DIAGNOSIS — D509 Iron deficiency anemia, unspecified: Secondary | ICD-10-CM

## 2024-01-16 LAB — OCCULT BLOOD X 1 CARD TO LAB, STOOL
Fecal Occult Bld: NEGATIVE
Fecal Occult Bld: NEGATIVE
Fecal Occult Bld: NEGATIVE

## 2024-01-20 ENCOUNTER — Encounter (INDEPENDENT_AMBULATORY_CARE_PROVIDER_SITE_OTHER): Payer: Self-pay

## 2024-01-20 ENCOUNTER — Ambulatory Visit (INDEPENDENT_AMBULATORY_CARE_PROVIDER_SITE_OTHER): Admitting: Otolaryngology

## 2024-01-20 VITALS — BP 137/72 | HR 121 | Ht 66.5 in | Wt 172.0 lb

## 2024-01-20 DIAGNOSIS — K118 Other diseases of salivary glands: Secondary | ICD-10-CM

## 2024-01-20 NOTE — Progress Notes (Signed)
 Dear Dr. Rosebud Confer, Here is my assessment for our mutual patient, [name]. Thank you for allowing me the opportunity to care for your patient. Please do not hesitate to contact me should you have any other questions. Sincerely, Dr. Milon Aloe  Otolaryngology Clinic Note Referring provider: Dr. Rosebud Confer HPI:  [name] is a 78 y.o. female kindly referred by Dr. Rosebud Confer for evaluation of left postauricular nodule/mass  Initial visit (12/2023): Patient reports: initial noted the left sided neck nodule (right under and behind her ear) about 3 years ago, since that time maybe has grown some. It will intermittently enlarge and then reduce in size. When it enlarges there is some discomfort. No current pain. Denies skin cancers. No h/o HIV. No dental issues, preauricular pain, bruxism, trismus. Enlarges randomly - not related to eating or other triggers she can think of. No abx prior. Patient otherwise denies: - dysphagia, odynophagia, unintentional weight loss - changes in voice, shortness of breath, hemoptysis - ear pain, neck masses  She has gotten a CT and US  for this which are as below. No biopsies.   H&N Surgery: Parathyroidectomy [name] 2013) Personal or FHx of bleeding dz or anesthesia difficulty: no  GLP-1: denies AP/AC: denies  Tobacco: never  PMHx: Breast cancer, Iron Deficiency Anemia, HTN, DM (on insulin ), DDD, GERD  Independent Review of Additional Tests or Records:  Dr. Rosebud Confer (Oncology) 01/05/2024: noted left superior neck mass, waxing and waning nodule around year x2 years, no other masses; lifelong non-smoker; US  and CT done Dr. Cleola Dach (11/06/2023): noted neck mass behind TMJ enlarging for 4 months, no pain; Rx: US  CBC (12/31/2023): WBC 9.4, Hgb 11.2, Plt 272; CMP 12/31/2023: BUN/Cr 17/1.11 (baseline 1-1.1) US  11/14/2023 independently interpreted: noted two cystic structures (1x1 cm and 1x1 cm); do not see any necrosis within the node, however CT Neck  11/10/2020: independently interpreted - small left intraparotid nodules v/s lymph nodes as below and tail of parotid nodule; no other adenopathy that is worrisome is appreciated; much small nodules (<1cm in contralateral parotid)   PMH/Meds/All/SocHx/FamHx/ROS:   Past Medical History:  Diagnosis Date   (HFpEF) heart failure with preserved ejection fraction (HCC)    Abdominal hernia 02/26/2012   unrepaired   Adenocarcinoma of breast (HCC) 1997   right / chemo + tamoxifen x 5 years    Anemia    Anxiety    Aortic atherosclerosis (HCC)    Arthritis    Blood transfusion 1980's   Breast cancer (HCC)    Cancer (HCC)    [employer] 08/18/2020   Complication of anesthesia    has a hard time waking up; can't lay flat   Degenerative disc disease, lumbar    pressing on L3 and L4   Diabetes mellitus (HCC) 1989   Type 2 NIDDM; "cancer treatment gave me diabetes"   Diabetes mellitus without complication (HCC)    [employer] 08/18/2020   Diverticulosis    DJD (degenerative joint disease) of lumbar spine    Dysrhythmia    "irregular"   Exertional dyspnea    Gastroparesis    GERD (gastroesophageal reflux disease)    mann   Heart murmur    "think I outgrew it"   Hepatic steatosis    History of kidney stones    History of stomach ulcers    Hx: UTI (urinary tract infection)    Hyperlipidemia    Hypersensitivity    in tongue   Hypertension    IBS (irritable bowel syndrome)    Insomnia  Internal hemorrhoids    Kidney cysts    RT KIDNEY   Kidney stones 2009   s/p lithotripsy   Mild CAD    Mild valvular heart disease    Nephrolithiasis 04/13/2014   Obesity    PFO (patent foramen ovale)    PONV (postoperative nausea and vomiting)    Pulmonary nodule    Shortness of breath    unable to lie flat   Ventral hernia      Past Surgical History:  Procedure Laterality Date   ABDOMINAL HYSTERECTOMY  2002   APPENDECTOMY     BACK SURGERY     Can't take any medical procedure in right arm    BREAST BIOPSY     right   BREAST SURGERY N/A    [employer] 04/22/2020   CATARACT EXTRACTION W/ INTRAOCULAR LENS  IMPLANT, BILATERAL  2009-2010   CESAREAN SECTION  1973; 1978; 1981   CESAREAN SECTION N/A    [employer] 04/22/2020   COLONOSCOPY WITH PROPOFOL  N/A 10/29/2016   Procedure: COLONOSCOPY WITH PROPOFOL ;  Surgeon: Alyce Jubilee, MD;  Location: [facility];  Service: Endoscopy;  Laterality: N/A;  10:00 am   CYSTOSCOPY Left 04/13/2014   Procedure: CYSTOSCOPY FLEXIBLE;  Surgeon: Andrez Banker, MD;  Location: [facility];  Service: Urology;  Laterality: Left;  with STENT   ESOPHAGOGASTRODUODENOSCOPY (EGD) WITH PROPOFOL  N/A 10/29/2016   Procedure: ESOPHAGOGASTRODUODENOSCOPY (EGD) WITH PROPOFOL ;  Surgeon: Alyce Jubilee, MD;  Location: [facility];  Service: Endoscopy;  Laterality: N/A;   EYE SURGERY     implant and screws put in the right lower jaw  11/2008   dental surgery   kidney blockage  ?1990's   " major surgery ;put kidney on pump for awhile"   LITHOTRIPSY     "several times"   LUMBAR FUSION  08/2010   MASTECTOMY  1997   right   NEPHROLITHOTOMY Left 04/13/2014   Procedure: LEFT PERCUTANEOUS NEPHROLITHOTOMY WITH SURGEON ACCESS;  Surgeon: Andrez Banker, MD;  Location: [facility];  Service: Urology;  Laterality: Left;   OVARIAN CYST SURGERY     PARATHYROIDECTOMY  02/26/2012   Procedure: PARATHYROIDECTOMY;  Surgeon: Lawence Press, MD;  Location: [facility];  Service: ENT;;   SAVORY DILATION N/A 10/29/2016   Procedure: SAVORY DILATION;  Surgeon: Alyce Jubilee, MD;  Location: [facility];  Service: Endoscopy;  Laterality: N/A;   SPINE SURGERY  2011   urological surgery for blocked ureter secondary to kidney stone      Family History  Problem Relation Age of Onset   Birth defects Mother    Heart attack Mother    Cancer Brother        Esophageal; smoker; deceased 3s   Colon cancer Brother    Diabetes Daughter    Leukemia Paternal Aunt        deceased 46s   Pancreatic cancer Paternal  Aunt        deceased 29   Cancer Paternal Aunt        GI cancer; deceased 44s   Cancer Cousin        kidney cancer; deceased 57; pat first cousin; daughter of aunt w/ leukemia   Cancer Cousin        colon cancer @ 59; pat first cousin; son of aunt with GI cancer   Anesthesia problems Neg Hx      Social Connections: Socially Integrated (12/23/2023)   Social Connection and Isolation Panel [NHANES]    Frequency of  Communication with Friends and Family: More than three times a week    Frequency of Social Gatherings with Friends and Family: More than three times a week    Attends Religious Services: More than 4 times per year    Active Member of [employer] or Organizations: Yes    Attends Engineer, structural: More than 4 times per year    Marital Status: Married      Current Outpatient Medications:    acetaminophen  (TYLENOL ) 325 MG tablet, Take 325 mg by mouth every 6 (six) hours as needed for mild pain., Disp: , Rfl:    amLODipine  (NORVASC ) 5 MG tablet, Take 1 tablet (5 mg total) by mouth daily., Disp: 90 tablet, Rfl: 3   blood glucose meter kit and supplies KIT, Dispense based on patient and insurance preference. Use up to four times daily as directed., Disp: 1 each, Rfl: 0   Blood Glucose Monitoring Suppl (BLOOD GLUCOSE SYSTEM PAK) KIT, Use as directed to monitor FSBS 1x daily. Dx: E11.9, Disp: 1 each, Rfl: 1   Cholecalciferol (VITAMIN D ) 50 MCG (2000 UT) CAPS, Take 1 capsule (2,000 Units total) by mouth daily., Disp: 90 capsule, Rfl: 3   CINNAMON PO, Take 1 capsule by mouth. Takes sometimes, Disp: , Rfl:    Continuous Glucose Sensor (FREESTYLE LIBRE 3 SENSOR) MISC, Use it to check blood glucose as instructed., Disp: 2 each, Rfl: 5   docusate sodium  (COLACE) 50 MG capsule, Take 1 capsule (50 mg total) by mouth daily as needed for mild constipation., Disp: 90 capsule, Rfl: 3   DULoxetine  (CYMBALTA ) 60 MG capsule, Take 1 capsule (60 mg total) by mouth daily., Disp: 90 capsule, Rfl: 3    ferrous sulfate  325 (65 FE) MG EC tablet, Take 1 tablet (325 mg total) by mouth every other day., Disp: 90 tablet, Rfl: 3   furosemide  (LASIX ) 40 MG tablet, Take 1 tablet (40 mg total) by mouth daily as needed (FOR SWELLING)., Disp: 90 tablet, Rfl: 0   glipiZIDE  (GLUCOTROL ) 10 MG tablet, Take 1 tablet (10 mg total) by mouth 2 (two) times daily before a meal., Disp: 180 tablet, Rfl: 3   glucose blood (ACCU-CHEK GUIDE) test strip, 1 each by Other route as needed for other. Use as instructed to check blood sugar daily., Disp: 100 each, Rfl: 3   insulin  glargine (LANTUS  SOLOSTAR) 100 UNIT/ML Solostar Pen, Inject 15 Units into the skin 2 (two) times daily., Disp: 15 mL, Rfl: 2   Insulin  Pen Needle 32G X 4 MM MISC, Use as directed for insulin  administration., Disp: 100 each, Rfl: 3   LANCETS SUPER THIN 28G MISC, Use to check blood sugar once daily., Disp: 100 each, Rfl: 3   metFORMIN  (GLUCOPHAGE -XR) 500 MG 24 hr tablet, TAKE ONE TABLET BY MOUTH ONCE DAILY WITH BREAKFAST., Disp: 90 tablet, Rfl: 0   metoprolol  tartrate (LOPRESSOR ) 25 MG tablet, Take 1 tablet (25 mg total) by mouth 2 (two) times daily., Disp: 30 tablet, Rfl: 0   Multiple Vitamin (MULTIVITAMIN) tablet, Take 1 tablet by mouth daily., Disp: , Rfl:    potassium chloride  (KLOR-CON ) 10 MEQ tablet, Take 1 tablet (10 mEq total) by mouth daily., Disp: 90 tablet, Rfl: 1   Physical Exam:   BP 137/72 (BP Location: Left Arm, Patient Position: Sitting, Cuff Size: Normal)   Pulse (!) 121   Ht 5' 6.5" (1.689 m)   Wt 172 lb (78 kg)   SpO2 96%   BMI 27.35 kg/m   Salient findings:  CN II-XII intact Bilateral EAC clear and TM intact with well pneumatized middle ear spaces Anterior rhinoscopy: Septum intact; bilateral inferior turbinates without significant hypertrophy No lesions of oral cavity/oropharynx; dentition - edentulous maxillary, mandibular dentition fair; easily expressible saliva b/l parotid glands No obviously palpable  lymphadenopathy/thyromegaly; well healed neck scar; noted left parotid tail well demarcated and mobile mass No respiratory distress or stridor No concerning skin lesions over face or neck  Seprately Identifiable Procedures:   None today  Impression & Plans:  Omaria Seguin is a 78 y.o. female with:  1. Parotid nodule    Left what appears to be parotid nodule and based on imaging, few intraparotid LN. Slowly growing and intermittently waxes/wanes in size as well. Unclear what is causing size to wax/wane but given persistence, will obtain FNA We did discuss DDX for this - most likely parotid nodule but could be LN; benign and malignant etiologies discussed F/u in 5 weeks with FNA prior  See below regarding exact medications prescribed this encounter including dosages and route: No orders of the defined types were placed in this encounter.     Thank you for allowing me the opportunity to care for your patient. Please do not hesitate to contact me should you have any other questions.  Sincerely, Milon Aloe, MD Otolaryngologist (ENT), [facility] Phone: [phone] Fax: [phone]  01/20/2024, 1:17 PM   MDM:  Level 4 - [phone] Complexity/Problems addressed: mod - new problem, unknown diagnosis and needs further testing for prognosis and diagnosis Data complexity: mod - independent review of notes, labs, and independent interpretation of imaging - Morbidity: unclear but low for FNA currently  - Prescription Drug prescribed or managed: no

## 2024-01-20 NOTE — Patient Instructions (Addendum)
 I have ordered an imaging study for you to complete prior to your next visit. Please call [facility] Scheduling at [phone] to schedule your imaging if you have not received a call within 24 hours. If you are unable to complete your imaging study prior to your next scheduled visit please call our office to let us know.

## 2024-01-22 ENCOUNTER — Ambulatory Visit (HOSPITAL_COMMUNITY)
Admission: RE | Admit: 2024-01-22 | Discharge: 2024-01-22 | Disposition: A | Discharge status: Home / Self Care | Source: Ambulatory Visit | Attending: Physician Assistant | Admitting: Physician Assistant

## 2024-01-22 ENCOUNTER — Encounter (HOSPITAL_COMMUNITY): Payer: Self-pay

## 2024-01-22 DIAGNOSIS — N644 Mastodynia: Secondary | ICD-10-CM | POA: Insufficient documentation

## 2024-01-22 DIAGNOSIS — C50919 Malignant neoplasm of unspecified site of unspecified female breast: Secondary | ICD-10-CM | POA: Diagnosis present

## 2024-01-22 DIAGNOSIS — Z9011 Acquired absence of right breast and nipple: Secondary | ICD-10-CM | POA: Diagnosis not present

## 2024-01-22 DIAGNOSIS — Z853 Personal history of malignant neoplasm of breast: Secondary | ICD-10-CM | POA: Insufficient documentation

## 2024-01-22 DIAGNOSIS — Z170 Estrogen receptor positive status [ER+]: Secondary | ICD-10-CM | POA: Diagnosis present

## 2024-01-22 HISTORY — DX: Personal history of irradiation: Z92.3

## 2024-01-22 HISTORY — DX: Personal history of antineoplastic chemotherapy: Z92.21

## 2024-01-23 ENCOUNTER — Institutional Professional Consult (permissible substitution) (INDEPENDENT_AMBULATORY_CARE_PROVIDER_SITE_OTHER)

## 2024-01-26 NOTE — Progress Notes (Unsigned)
 Roxie Cord, MD  [facility] Approved for FNA biopsy of left parotid nodule, possible necrotic LN vs mixed cystic/solid nodule.    HKM       Previous Messages    ----- Message ----- From: Derrell Flight Sent: 01/26/2024   9:16 AM EDT To: Derrell Flight; Ir Procedure Requests Subject: US  FNA SOFT TISSUE                            Procedure :US  FNA SOFT TISSUE  Reason :left parotid nodule, rule out carcinoma Dx: Parotid nodule [K11.8 (ICD-10-CM)]  History :US  Soft Tissue Head/Neck (NON-THYROID )  Provider:Patel, Reina Cara, MD  Provider contact [phone]

## 2024-01-30 ENCOUNTER — Encounter (HOSPITAL_COMMUNITY): Payer: Self-pay

## 2024-02-04 ENCOUNTER — Other Ambulatory Visit: Payer: Self-pay | Admitting: Internal Medicine

## 2024-02-06 ENCOUNTER — Ambulatory Visit: Payer: Medicare Other | Admitting: Internal Medicine

## 2024-02-06 ENCOUNTER — Encounter: Payer: Self-pay | Admitting: Internal Medicine

## 2024-02-06 VITALS — BP 105/63 | HR 101 | Ht 66.5 in | Wt 177.4 lb

## 2024-02-06 DIAGNOSIS — E1143 Type 2 diabetes mellitus with diabetic autonomic (poly)neuropathy: Secondary | ICD-10-CM

## 2024-02-06 DIAGNOSIS — E1165 Type 2 diabetes mellitus with hyperglycemia: Secondary | ICD-10-CM | POA: Diagnosis not present

## 2024-02-06 DIAGNOSIS — I10 Essential (primary) hypertension: Secondary | ICD-10-CM | POA: Diagnosis not present

## 2024-02-06 DIAGNOSIS — Z794 Long term (current) use of insulin: Secondary | ICD-10-CM

## 2024-02-06 DIAGNOSIS — E782 Mixed hyperlipidemia: Secondary | ICD-10-CM | POA: Diagnosis not present

## 2024-02-06 MED ORDER — FREESTYLE LIBRE 3 PLUS SENSOR MISC: 3 refills | Status: DC

## 2024-02-06 MED ORDER — METOPROLOL TARTRATE 25 MG PO TABS: 25.0000 mg | ORAL_TABLET | Freq: Two times a day (BID) | ORAL | 1 refills | Status: DC

## 2024-02-06 MED ORDER — LANTUS SOLOSTAR 100 UNIT/ML ~~LOC~~ SOPN: 20.0000 [IU] | PEN_INJECTOR | Freq: Two times a day (BID) | SUBCUTANEOUS | 2 refills | Status: DC

## 2024-02-06 NOTE — Patient Instructions (Addendum)
 Please start taking Lantus  20 Units twice daily for now. Please follow up with Nutritionist.  Please continue to take medications as prescribed.  Please continue to follow low carb diet and perform moderate exercise/walking at least 150 mins/week.  Please contact Dr Avanell Bob' office at [phone] to schedule appointment.

## 2024-02-06 NOTE — Assessment & Plan Note (Signed)
Follows up with lipid clinic On Nexletol, but did not take it Has statin induced myopathy

## 2024-02-06 NOTE — Progress Notes (Signed)
 Established Patient Office Visit  Subjective:  Patient ID: [name], female    DOB: Feb 18, 1946  Age: 78 y.o. MRN: [id number]  CC:  Chief Complaint  Patient presents with   Medical Management of Chronic Issues    3 month f/u    HPI [name] is a 78 y.o. female with past medical history of HTN, DM, breast ca s/p right mastectomy and chemotherapy, HLD, DDD of lumbar spine and GERD who presents for f/u of her chronic medical conditions.  Her BP was wnl today.  She has been taking amlodipine  and metoprolol  now.  She denies any headache, chest pain, dyspnea or palpitations.  She takes Lantus  15 units BID, glipizide  10 mg BID and metformin  ER 500 mg QD for DM. Her HbA1C had improved to 9.0 in 02/25 from 11.1 in 08/24. She denies polyuria or polydipsia. She did not tolerate Januvia , Jardiance  and Trulicity  in the past.  She saw nutritionist and had freestyle libre. She had glucose levels in 300s about a month ago, and was told to take Lantus  BID by Alison Irvine, NP. BS have been higher lately in 200s mostly.  Does not report any episode of hypoglycemia recently.  She did not tolerate statin in the past and has tried taking Nexletol , sent by lipid clinic.  She complains of chronic fatigue and also reports having neuropathic pain in her legs and feet, which have been chronic, but is improved now. She takes Duloxetine  now.  She reports noticing neck mass behind TMJ, which have been recurrently enlarging for the last 4 months.  Denies any pain while chewing.  Denies any pain while neck movement currently. She has seen ENT specialist and is planned to get biopsy of the mass.  Past Medical History:  Diagnosis Date   (HFpEF) heart failure with preserved ejection fraction (HCC)    Abdominal hernia 02/26/2012   unrepaired   Adenocarcinoma of breast (HCC) 1997   right / chemo + tamoxifen x 5 years    Anemia    Anxiety    Aortic atherosclerosis (HCC)    Arthritis    Blood transfusion  1980's   Breast cancer (HCC)    Cancer (HCC)    [employer]reesia 08/18/2020   Complication of anesthesia    has a hard time waking up; can't lay flat   Degenerative disc disease, lumbar    pressing on L3 and L4   Diabetes mellitus (HCC) 1989   Type 2 NIDDM; "cancer treatment gave me diabetes"   Diabetes mellitus without complication (HCC)    [employer]reesia 08/18/2020   Diverticulosis    DJD (degenerative joint disease) of lumbar spine    Dysrhythmia    "irregular"   Exertional dyspnea    Gastroparesis    GERD (gastroesophageal reflux disease)    mann   Heart murmur    "think I outgrew it"   Hepatic steatosis    History of kidney stones    History of stomach ulcers    Hx: UTI (urinary tract infection)    Hyperlipidemia    Hypersensitivity    in tongue   Hypertension    IBS (irritable bowel syndrome)    Insomnia    Internal hemorrhoids    Kidney cysts    RT KIDNEY   Kidney stones 2009   s/p lithotripsy   Mild CAD    Mild valvular heart disease    Nephrolithiasis 04/13/2014   Obesity    Personal history of chemotherapy    Personal  history of radiation therapy    PFO (patent foramen ovale)    PONV (postoperative nausea and vomiting)    Pulmonary nodule    Shortness of breath    unable to lie flat   Ventral hernia     Past Surgical History:  Procedure Laterality Date   ABDOMINAL HYSTERECTOMY  2002   APPENDECTOMY     BACK SURGERY     Can't take any medical procedure in right arm   BREAST BIOPSY     right   BREAST SURGERY N/A    [employer]reesia 04/22/2020   CATARACT EXTRACTION W/ INTRAOCULAR LENS  IMPLANT, BILATERAL  2009-2010   CESAREAN SECTION  1973; 1978; 1981   CESAREAN SECTION N/A    [employer]reesia 04/22/2020   COLONOSCOPY WITH PROPOFOL  N/A 10/29/2016   Procedure: COLONOSCOPY WITH PROPOFOL ;  Surgeon: Alyce Jubilee, MD;  Location: [facility];  Service: Endoscopy;  Laterality: N/A;  10:00 am   CYSTOSCOPY Left 04/13/2014   Procedure: CYSTOSCOPY FLEXIBLE;  Surgeon: Andrez Banker, MD;  Location: [facility];  Service: Urology;  Laterality: Left;  with STENT   ESOPHAGOGASTRODUODENOSCOPY (EGD) WITH PROPOFOL  N/A 10/29/2016   Procedure: ESOPHAGOGASTRODUODENOSCOPY (EGD) WITH PROPOFOL ;  Surgeon: Alyce Jubilee, MD;  Location: [facility];  Service: Endoscopy;  Laterality: N/A;   EYE SURGERY     implant and screws put in the right lower jaw  11/2008   dental surgery   kidney blockage  ?1990's   " major surgery ;put kidney on pump for awhile"   LITHOTRIPSY     "several times"   LUMBAR FUSION  08/2010   MASTECTOMY  1997   right   NEPHROLITHOTOMY Left 04/13/2014   Procedure: LEFT PERCUTANEOUS NEPHROLITHOTOMY WITH SURGEON ACCESS;  Surgeon: Andrez Banker, MD;  Location: [facility];  Service: Urology;  Laterality: Left;   OVARIAN CYST SURGERY     PARATHYROIDECTOMY  02/26/2012   Procedure: PARATHYROIDECTOMY;  Surgeon: Lawence Press, MD;  [facility];  Service: ENT;;   SAVORY DILATION N/A 10/29/2016   Procedure: SAVORY DILATION;  Surgeon: Alyce Jubilee, MD;  Location: [facility];  Service: Endoscopy;  Laterality: N/A;   SPINE SURGERY  2011   urological surgery for blocked ureter secondary to kidney stone      Family History  Problem Relation Age of Onset   Birth defects Mother    Heart attack Mother    Cancer Brother        Esophageal; smoker; deceased 67s   Colon cancer Brother    Diabetes Daughter    Leukemia Paternal Aunt        deceased 68s   Pancreatic cancer Paternal Aunt        deceased 50   Cancer Paternal Aunt        GI cancer; deceased 2s   Cancer Cousin        kidney cancer; deceased 63; pat first cousin; daughter of aunt w/ leukemia   Cancer Cousin        colon cancer @ 1; pat first cousin; son of aunt with GI cancer   Anesthesia problems Neg Hx     Social History   Socioeconomic History   Marital status: Married    Spouse name: Not on file   Number of children: 3   Years of education: Not on file   Highest education level: Bachelor's  degree (e.g., BA, AB, BS)  Occupational History   Occupation: retired in 2011  Tobacco Use  Smoking status: Never   Smokeless tobacco: Never  Vaping Use   Vaping status: Never Used  Substance and Sexual Activity   Alcohol  use: Yes    Alcohol /week: 0.0 standard drinks of alcohol     Comment: X2 DRINKS PER YEAR   Drug use: No   Sexual activity: Not Currently    Birth control/protection: Surgical  Other Topics Concern   Not on file  Social History Narrative   Right handed   Caffeine-2 cups daily occasionally   Social Drivers of Health   Financial Resource Strain: Low Risk  (12/23/2023)   Overall Financial Resource Strain (CARDIA)    Difficulty of Paying Living Expenses: Not hard at all  Food Insecurity: No Food Insecurity (12/23/2023)   Hunger Vital Sign    Worried About Running Out of Food in the Last Year: Never true    Ran Out of Food in the Last Year: Never true  Transportation Needs: Unknown (12/23/2023)   PRAPARE - Administrator, Civil Service (Medical): Not on file    Lack of Transportation (Non-Medical): No  Physical Activity: Unknown (12/23/2023)   Exercise Vital Sign    Days of Exercise per Week: 0 days    Minutes of Exercise per Session: Not on file  Stress: Stress Concern Present (12/23/2023)   [employer] of Occupational Health - Occupational Stress Questionnaire    Feeling of Stress : To some extent  Social Connections: Socially Integrated (12/23/2023)   Social Connection and Isolation Panel [NHANES]    Frequency of Communication with Friends and Family: More than three times a week    Frequency of Social Gatherings with Friends and Family: More than three times a week    Attends Religious Services: More than 4 times per year    Active Member of [employer] or Organizations: Yes    Attends Engineer, structural: More than 4 times per year    Marital Status: Married  Catering manager Violence: Not on file    Outpatient Medications Prior to Visit   Medication Sig Dispense Refill   acetaminophen  (TYLENOL ) 325 MG tablet Take 325 mg by mouth every 6 (six) hours as needed for mild pain.     amLODipine  (NORVASC ) 5 MG tablet Take 1 tablet (5 mg total) by mouth daily. 90 tablet 3   blood glucose meter kit and supplies KIT Dispense based on patient and insurance preference. Use up to four times daily as directed. 1 each 0   Blood Glucose Monitoring Suppl (BLOOD GLUCOSE SYSTEM PAK) KIT Use as directed to monitor FSBS 1x daily. Dx: E11.9 1 each 1   Cholecalciferol (VITAMIN D ) 50 MCG (2000 UT) CAPS Take 1 capsule (2,000 Units total) by mouth daily. 90 capsule 3   CINNAMON PO Take 1 capsule by mouth. Takes sometimes     docusate sodium  (COLACE) 50 MG capsule Take 1 capsule (50 mg total) by mouth daily as needed for mild constipation. 90 capsule 3   DULoxetine  (CYMBALTA ) 60 MG capsule Take 1 capsule (60 mg total) by mouth daily. 90 capsule 3   ferrous sulfate  325 (65 FE) MG EC tablet Take 1 tablet (325 mg total) by mouth every other day. 90 tablet 3   furosemide  (LASIX ) 40 MG tablet Take 1 tablet (40 mg total) by mouth daily as needed (FOR SWELLING). 90 tablet 0   glipiZIDE  (GLUCOTROL ) 10 MG tablet Take 1 tablet (10 mg total) by mouth 2 (two) times daily before a meal. 180 tablet 3  glucose blood (ACCU-CHEK GUIDE) test strip 1 each by Other route as needed for other. Use as instructed to check blood sugar daily. 100 each 3   Insulin  Pen Needle 32G X 4 MM MISC Use as directed for insulin  administration. 100 each 3   LANCETS SUPER THIN 28G MISC Use to check blood sugar once daily. 100 each 3   metFORMIN  (GLUCOPHAGE -XR) 500 MG 24 hr tablet TAKE ONE TABLET BY MOUTH ONCE DAILY WITH BREAKFAST. 90 tablet 0   Multiple Vitamin (MULTIVITAMIN) tablet Take 1 tablet by mouth daily.     potassium chloride  (KLOR-CON ) 10 MEQ tablet Take 1 tablet (10 mEq total) by mouth daily. 90 tablet 1   Continuous Glucose Sensor (FREESTYLE LIBRE 3 SENSOR) MISC Use it to check blood  glucose as instructed. 2 each 5   insulin  glargine (LANTUS  SOLOSTAR) 100 UNIT/ML Solostar Pen Inject 15 Units into the skin 2 (two) times daily. 15 mL 2   metoprolol  tartrate (LOPRESSOR ) 25 MG tablet Take 1 tablet (25 mg total) by mouth 2 (two) times daily. 30 tablet 0   No facility-administered medications prior to visit.    Allergies  Allergen Reactions   Demerol [Meperidine] Other (See Comments)    Lost consciousness   Bentyl  [Dicyclomine  Hcl] Swelling   Invokana [Canagliflozin] Hives   Metformin  And Related Diarrhea   Protonix  [Pantoprazole  Sodium] Swelling   Statins Other (See Comments)    myalgia   Ace Inhibitors Cough    ROS Review of Systems  Constitutional:  Positive for fatigue. Negative for chills and fever.  HENT:  Negative for congestion, sinus pressure, sinus pain and sore throat.   Eyes:  Negative for pain and discharge.  Respiratory:  Negative for cough and shortness of breath.   Cardiovascular:  Negative for chest pain and palpitations.  Gastrointestinal:  Negative for abdominal pain, diarrhea, nausea and vomiting.  Endocrine: Negative for polydipsia and polyuria.  Genitourinary:  Negative for dysuria and hematuria.  Musculoskeletal:  Positive for arthralgias and back pain. Negative for neck pain and neck stiffness.  Skin:  Negative for rash.  Neurological:  Positive for weakness (B/l LE) and numbness (B/l hands).       Numbness and tingling in b/l LE  Psychiatric/Behavioral:  Negative for agitation and behavioral problems.       Objective:    Physical Exam Vitals reviewed.  Constitutional:      General: She is not in acute distress.    Appearance: She is not diaphoretic.  HENT:     Head: Normocephalic and atraumatic.     Nose: Nose normal.     Mouth/Throat:     Mouth: Mucous membranes are moist.  Eyes:     General: No scleral icterus.    Extraocular Movements: Extraocular movements intact.  Neck:     Comments: Mass behind left TMJ, about 2.5 cm  X 2 cm oval shaped, nontender Cardiovascular:     Rate and Rhythm: Normal rate and regular rhythm.     Heart sounds: Normal heart sounds. No murmur heard. Pulmonary:     Breath sounds: Normal breath sounds. No wheezing or rales.  Abdominal:     Palpations: Abdomen is soft.     Tenderness: There is no abdominal tenderness.  Musculoskeletal:     Cervical back: Neck supple. No tenderness.     Right lower leg: No edema.     Left lower leg: No edema.  Skin:    General: Skin is warm.     Findings:  No rash.  Neurological:     General: No focal deficit present.     Mental Status: She is alert and oriented to person, place, and time.     Sensory: Sensory deficit (B/l feet) present.     Motor: Weakness (B/l LE - 4/5) present.  Psychiatric:        Mood and Affect: Mood normal.        Behavior: Behavior normal.     BP 105/63   Pulse (!) 101   Ht 5' 6.5" (1.689 m)   Wt 177 lb 6.4 oz (80.5 kg)   SpO2 90%   BMI 28.20 kg/m  Wt Readings from Last 3 Encounters:  02/06/24 177 lb 6.4 oz (80.5 kg)  01/20/24 172 lb (78 kg)  01/05/24 177 lb (80.3 kg)    Lab Results  Component Value Date   TSH 0.844 09/17/2022   Lab Results  Component Value Date   WBC 9.4 12/31/2023   HGB 11.2 (L) 12/31/2023   HCT 35.3 (L) 12/31/2023   MCV 82.7 12/31/2023   PLT 272 12/31/2023   Lab Results  Component Value Date   NA 139 12/31/2023   K 3.9 12/31/2023   CO2 24 12/31/2023   GLUCOSE 210 (H) 12/31/2023   BUN 17 12/31/2023   CREATININE 1.11 (H) 12/31/2023   BILITOT 0.5 12/31/2023   ALKPHOS 58 12/31/2023   AST 18 12/31/2023   ALT 14 12/31/2023   PROT 7.2 12/31/2023   ALBUMIN 3.4 (L) 12/31/2023   CALCIUM  9.0 12/31/2023   ANIONGAP 12 12/31/2023   EGFR 63 11/03/2023   GFR 55.96 (L) 02/23/2020   Lab Results  Component Value Date   CHOL 213 (H) 11/03/2023   Lab Results  Component Value Date   HDL 78 11/03/2023   Lab Results  Component Value Date   LDLCALC 112 (H) 11/03/2023   Lab  Results  Component Value Date   TRIG 131 11/03/2023   Lab Results  Component Value Date   CHOLHDL 2.7 11/03/2023   Lab Results  Component Value Date   HGBA1C 9.0 (H) 11/03/2023      Assessment & Plan:   Problem List Items Addressed This Visit       Cardiovascular and Mediastinum   Essential hypertension   BP Readings from Last 1 Encounters:  02/06/24 105/63   Well controlled with amlodipine  and metoprolol  Counseled for compliance with the medications Advised DASH diet and moderate exercise/walking, at least 150 mins/week      Relevant Medications   metoprolol  tartrate (LOPRESSOR ) 25 MG tablet     Endocrine   Uncontrolled type 2 diabetes mellitus with hyperglycemia, without long-term current use of insulin  (HCC) - Primary   Lab Results  Component Value Date   HGBA1C 9.0 (H) 11/03/2023   Uncontrolled, was improving - needs follow up with Nutritionist On Lantus  15 U BID, glipizide  10 mg BID and Metformin  500 XR mg QD as she had diarrhea with regular Metformin  Increased dose of Lantus  to 20 U BID, she denies ISS Did not tolerate Jardiance , Januvia  and GLP-1 agonist in the past  She likely has mostly hyperglycemia, but due to her diet noncompliance, concern for hypoglycemia as well - she would benefit from CGM, sent freestyle libre 3 plus sensors Needs to follow diabetic diet - refer to nutritional counseling as she reports noncompliance to diabetic diet F/u CMP and lipid panel Diabetic eye exam: Advised to follow up with Ophthalmology for diabetic eye exam  Relevant Medications   insulin  glargine (LANTUS  SOLOSTAR) 100 UNIT/ML Solostar Pen   Continuous Glucose Sensor (FREESTYLE LIBRE 3 PLUS SENSOR) MISC   Other Relevant Orders   Bayer DCA Hb A1c Waived   Diabetic neuropathy (HCC)   Did not tolerate gabapentin  or Lyrica  Her gait disturbance is likely due to diabetic neuropathy She has chronic burning pain and tingling in the UE and LE Had started Cymbalta  30 mg  QD, Neurology increased dose to 60 mg QD Followed by neurology      Relevant Medications   insulin  glargine (LANTUS  SOLOSTAR) 100 UNIT/ML Solostar Pen     Other   Hyperlipidemia   Follows up with lipid clinic On Nexletol , but did not take it Has statin induced myopathy      Relevant Medications   metoprolol  tartrate (LOPRESSOR ) 25 MG tablet       Meds ordered this encounter  Medications   insulin  glargine (LANTUS  SOLOSTAR) 100 UNIT/ML Solostar Pen    Sig: Inject 20 Units into the skin 2 (two) times daily.    Dispense:  30 mL    Refill:  2   metoprolol  tartrate (LOPRESSOR ) 25 MG tablet    Sig: Take 1 tablet (25 mg total) by mouth 2 (two) times daily.    Dispense:  180 tablet    Refill:  1   Continuous Glucose Sensor (FREESTYLE LIBRE 3 PLUS SENSOR) MISC    Sig: Change sensor every 15 days.    Dispense:  6 each    Refill:  3    Follow-up: After 3 months.   Meldon Sport, MD

## 2024-02-06 NOTE — Assessment & Plan Note (Signed)
Did not tolerate gabapentin or Lyrica Her gait disturbance is likely due to diabetic neuropathy She has chronic burning pain and tingling in the UE and LE Had started Cymbalta 30 mg QD, Neurology increased dose to 60 mg QD Followed by neurology

## 2024-02-06 NOTE — Assessment & Plan Note (Signed)
 BP Readings from Last 1 Encounters:  02/06/24 105/63   Well controlled with amlodipine  and metoprolol  Counseled for compliance with the medications Advised DASH diet and moderate exercise/walking, at least 150 mins/week

## 2024-02-06 NOTE — Assessment & Plan Note (Addendum)
 Lab Results  Component Value Date   HGBA1C 9.0 (H) 11/03/2023   Uncontrolled, was improving - needs follow up with Nutritionist On Lantus  15 U BID, glipizide  10 mg BID and Metformin  500 XR mg QD as she had diarrhea with regular Metformin  Increased dose of Lantus  to 20 U BID, she denies ISS Did not tolerate Jardiance , Januvia  and GLP-1 agonist in the past  She likely has mostly hyperglycemia, but due to her diet noncompliance, concern for hypoglycemia as well - she would benefit from CGM, sent freestyle libre 3 plus sensors Needs to follow diabetic diet - refer to nutritional counseling as she reports noncompliance to diabetic diet F/u CMP and lipid panel Diabetic eye exam: Advised to follow up with Ophthalmology for diabetic eye exam

## 2024-02-11 ENCOUNTER — Ambulatory Visit: Payer: Self-pay | Admitting: Internal Medicine

## 2024-02-11 LAB — BAYER DCA HB A1C WAIVED: HB A1C (BAYER DCA - WAIVED): 9.3 % — ABNORMAL HIGH (ref 4.8–5.6)

## 2024-02-23 ENCOUNTER — Ambulatory Visit (HOSPITAL_COMMUNITY)
Admission: RE | Admit: 2024-02-23 | Discharge: 2024-02-23 | Disposition: A | Discharge status: Home / Self Care | Source: Ambulatory Visit | Attending: Otolaryngology | Admitting: Otolaryngology

## 2024-02-23 DIAGNOSIS — D119 Benign neoplasm of major salivary gland, unspecified: Secondary | ICD-10-CM | POA: Insufficient documentation

## 2024-02-23 DIAGNOSIS — K118 Other diseases of salivary glands: Secondary | ICD-10-CM | POA: Insufficient documentation

## 2024-02-23 MED ORDER — LIDOCAINE HCL (PF) 1 % IJ SOLN
10.0000 mL | Freq: Once | INTRAMUSCULAR | Status: AC
  Administered 2024-02-23: 10 mL via INTRADERMAL

## 2024-02-23 NOTE — Procedures (Signed)
  Procedure:  US  FNA L parotid nodule 25g x5 Preprocedure diagnosis: The encounter diagnosis was Parotid nodule. Postprocedure diagnosis: same EBL:    minimal Complications:   none immediate  See full dictation in [employer].  Nicky Barrack MD Main # [phone] Pager  [phone] Mobile [phone]

## 2024-02-24 LAB — CYTOLOGY - NON PAP

## 2024-02-26 ENCOUNTER — Encounter (INDEPENDENT_AMBULATORY_CARE_PROVIDER_SITE_OTHER): Payer: Self-pay | Admitting: Otolaryngology

## 2024-02-26 ENCOUNTER — Ambulatory Visit (INDEPENDENT_AMBULATORY_CARE_PROVIDER_SITE_OTHER): Admitting: Otolaryngology

## 2024-02-26 VITALS — BP 136/77 | HR 83

## 2024-02-26 DIAGNOSIS — H608X3 Other otitis externa, bilateral: Secondary | ICD-10-CM | POA: Diagnosis not present

## 2024-02-26 DIAGNOSIS — D119 Benign neoplasm of major salivary gland, unspecified: Secondary | ICD-10-CM | POA: Diagnosis not present

## 2024-02-26 MED ORDER — FLUOCINOLONE ACETONIDE 0.01 % OT OIL: 3.0000 [drp] | TOPICAL_OIL | Freq: Every evening | OTIC | 5 refills | Status: AC

## 2024-02-26 NOTE — Progress Notes (Signed)
 Dear Dr. Lydia Sams, Here is my assessment for our mutual patient, [name]. Thank you for allowing me the opportunity to care for your patient. Please do not hesitate to contact me should you have any other questions. Sincerely, Dr. Milon Aloe  Otolaryngology Clinic Note Referring provider: Dr. Lydia Sams HPI:  [name] is a 78 y.o. female kindly referred by Dr. Lydia Sams for evaluation of left postauricular nodule/mass  Initial visit (12/2023): Patient reports: initial noted the left sided neck nodule (right under and behind her ear) about 3 years ago, since that time maybe has grown some. It will intermittently enlarge and then reduce in size. When it enlarges there is some discomfort. No current pain. Denies skin cancers. No h/o HIV. No dental issues, preauricular pain, bruxism, trismus. Enlarges randomly - not related to eating or other triggers she can think of. No abx prior. Patient otherwise denies: - dysphagia, odynophagia, unintentional weight loss - changes in voice, shortness of breath, hemoptysis - ear pain, neck masses  She has gotten a CT and US  for this which are as below. No biopsies.  --------------------------------------------------------- 02/26/2024 we discussed results of her US  and biopsies. She had several questions which I answered; she continues to report that the nodule behind her ear can sometimes fluctuate in size. No facial weakness or numbness. Does complain of bilateral ear itching, otherwise no otologic sx such as drainage, pain, fullness, hearing change   H&N Surgery: Parathyroidectomy Darlin Ehrlich 2013) Personal or FHx of bleeding dz or anesthesia difficulty: no  GLP-1: denies AP/AC: denies  Tobacco: never  PMHx: Breast cancer, Iron Deficiency Anemia, HTN, DM (on insulin ), DDD, GERD  Independent Review of Additional Tests or Records:  Dr. Rosebud Confer (Oncology) 01/05/2024: noted left superior neck mass, waxing and waning nodule around year x2 years, no other  masses; lifelong non-smoker; US  and CT done Dr. 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 PMH/Meds/All/SocHx/FamHx/ROS:   Past Medical History:  Diagnosis Date   (HFpEF) heart failure with preserved ejection fraction (HCC)    Abdominal hernia 02/26/2012   unrepaired   Adenocarcinoma of breast (HCC) 1997   right / chemo + tamoxifen x 5 years    Anemia    Anxiety    Aortic atherosclerosis (HCC)    Arthritis    Blood transfusion 1980's   Breast cancer (HCC)    Cancer (HCC)    [employer]reesia 08/18/2020   Complication of anesthesia    has a hard time waking up; can't lay flat   Degenerative disc disease, lumbar    pressing on L3 and L4   Diabetes mellitus (HCC) 1989   Type 2 NIDDM; "cancer treatment gave me diabetes"   Diabetes mellitus without complication (HCC)    [employer]reesia 08/18/2020   Diverticulosis    DJD (degenerative joint disease) of lumbar spine    Dysrhythmia    "irregular"   Exertional dyspnea    Gastroparesis    GERD (gastroesophageal reflux disease)    mann   Heart murmur    "  think I outgrew it"   Hepatic steatosis    History of kidney stones    History of stomach ulcers    Hx: UTI (urinary tract infection)    Hyperlipidemia    Hypersensitivity    in tongue   Hypertension    IBS (irritable bowel syndrome)    Insomnia    Internal hemorrhoids    Kidney cysts    RT KIDNEY   Kidney stones 2009   s/p lithotripsy   Mild  CAD    Mild valvular heart disease    Nephrolithiasis 04/13/2014   Obesity    Personal history of chemotherapy    Personal history of radiation therapy    PFO (patent foramen ovale)    PONV (postoperative nausea and vomiting)    Pulmonary nodule    Shortness of breath    unable to lie flat   Ventral hernia      Past Surgical History:  Procedure Laterality Date   ABDOMINAL HYSTERECTOMY  2002   APPENDECTOMY     BACK SURGERY     Can't take any medical procedure in right arm   BREAST BIOPSY     right   BREAST SURGERY N/A    [employer]reesia 04/22/2020   CATARACT EXTRACTION W/ INTRAOCULAR LENS  IMPLANT, BILATERAL  2009-2010   CESAREAN SECTION  1973; 1978; 1981   CESAREAN SECTION N/A    [employer]ia 04/22/2020   COLONOSCOPY WITH PROPOFOL  N/A 10/29/2016   Procedure: COLONOSCOPY WITH PROPOFOL ;  Surgeon: Alyce Jubilee, MD;  Location: [facility];  Service: Endoscopy;  Laterality: N/A;  10:00 am   CYSTOSCOPY Left 04/13/2014   Procedure: CYSTOSCOPY FLEXIBLE;  Surgeon: Andrez Banker, MD;  Location: [facility];  Service: Urology;  Laterality: Left;  with STENT   ESOPHAGOGASTRODUODENOSCOPY (EGD) WITH PROPOFOL  N/A 10/29/2016   Procedure: ESOPHAGOGASTRODUODENOSCOPY (EGD) WITH PROPOFOL ;  Surgeon: Alyce Jubilee, MD;  Location: [facility];  Service: Endoscopy;  Laterality: N/A;   EYE SURGERY     implant and screws put in the right lower jaw  11/2008   dental surgery   kidney blockage  ?1990's   " major surgery ;put kidney on pump for awhile"   LITHOTRIPSY     "several times"   LUMBAR FUSION  08/2010   MASTECTOMY  1997   right   NEPHROLITHOTOMY Left 04/13/2014   Procedure: LEFT PERCUTANEOUS NEPHROLITHOTOMY WITH SURGEON ACCESS;  Surgeon: Andrez Banker, MD;  Location: [facility];  Service: Urology;  Laterality: Left;   OVARIAN CYST SURGERY     PARATHYROIDECTOMY  02/26/2012   Procedure: PARATHYROIDECTOMY;  Surgeon: Lawence Press, MD;  Location: [facility];  Service: ENT;;   SAVORY DILATION N/A 10/29/2016    Procedure: SAVORY DILATION;  Surgeon: Alyce Jubilee, MD;  Location: [facility];  Service: Endoscopy;  Laterality: N/A;   SPINE SURGERY  2011   urological surgery for blocked ureter secondary to kidney stone      Family History  Problem Relation Age of Onset   Birth defects Mother    Heart attack Mother    Cancer Brother        Esophageal; smoker; deceased 18s   Colon cancer Brother    Diabetes Daughter    Leukemia Paternal Aunt        deceased 64s   Pancreatic cancer Paternal Aunt        deceased 51   Cancer Paternal Aunt        GI cancer; deceased 44s  Cancer Cousin        kidney cancer; deceased 42; pat first cousin; daughter of aunt w/ leukemia   Cancer Cousin        colon cancer @ 92; pat first cousin; son of aunt with GI cancer   Anesthesia problems Neg Hx      Social Connections: Socially Integrated (12/23/2023)   Social Connection and Isolation Panel [NHANES]    Frequency of Communication with Friends and Family: More than three times a week    Frequency of Social Gatherings with Friends and Family: More than three times a week    Attends Religious Services: More than 4 times per year    Active Member of [employer] or Organizations: Yes    Attends Engineer, structural: More than 4 times per year    Marital Status: Married      Current Outpatient Medications:    acetaminophen  (TYLENOL ) 325 MG tablet, Take 325 mg by mouth every 6 (six) hours as needed for mild pain., Disp: , Rfl:    amLODipine  (NORVASC ) 5 MG tablet, Take 1 tablet (5 mg total) by mouth daily., Disp: 90 tablet, Rfl: 3   blood glucose meter kit and supplies KIT, Dispense based on patient and insurance preference. Use up to four times daily as directed., Disp: 1 each, Rfl: 0   Blood Glucose Monitoring Suppl (BLOOD GLUCOSE SYSTEM PAK) KIT, Use as directed to monitor FSBS 1x daily. Dx: E11.9, Disp: 1 each, Rfl: 1   Cholecalciferol (VITAMIN D ) 50 MCG (2000 UT) CAPS, Take 1 capsule (2,000 Units total) by  mouth daily., Disp: 90 capsule, Rfl: 3   CINNAMON PO, Take 1 capsule by mouth. Takes sometimes, Disp: , Rfl:    Continuous Glucose Sensor (FREESTYLE LIBRE 3 PLUS SENSOR) MISC, Change sensor every 15 days., Disp: 6 each, Rfl: 3   docusate sodium  (COLACE) 50 MG capsule, Take 1 capsule (50 mg total) by mouth daily as needed for mild constipation., Disp: 90 capsule, Rfl: 3   DULoxetine  (CYMBALTA ) 60 MG capsule, Take 1 capsule (60 mg total) by mouth daily., Disp: 90 capsule, Rfl: 3   ferrous sulfate  325 (65 FE) MG EC tablet, Take 1 tablet (325 mg total) by mouth every other day., Disp: 90 tablet, Rfl: 3   Fluocinolone Acetonide (DERMOTIC) 0.01 % OIL, Place 3 drops in ear(s) at bedtime for 14 days., Disp: 20 mL, Rfl: 5   furosemide  (LASIX ) 40 MG tablet, Take 1 tablet (40 mg total) by mouth daily as needed (FOR SWELLING)., Disp: 90 tablet, Rfl: 0   glipiZIDE  (GLUCOTROL ) 10 MG tablet, Take 1 tablet (10 mg total) by mouth 2 (two) times daily before a meal., Disp: 180 tablet, Rfl: 3   glucose blood (ACCU-CHEK GUIDE) test strip, 1 each by Other route as needed for other. Use as instructed to check blood sugar daily., Disp: 100 each, Rfl: 3   insulin  glargine (LANTUS  SOLOSTAR) 100 UNIT/ML Solostar Pen, Inject 20 Units into the skin 2 (two) times daily., Disp: 30 mL, Rfl: 2   Insulin  Pen Needle 32G X 4 MM MISC, Use as directed for insulin  administration., Disp: 100 each, Rfl: 3   LANCETS SUPER THIN 28G MISC, Use to check blood sugar once daily., Disp: 100 each, Rfl: 3   metFORMIN  (GLUCOPHAGE -XR) 500 MG 24 hr tablet, TAKE ONE TABLET BY MOUTH ONCE DAILY WITH BREAKFAST., Disp: 90 tablet, Rfl: 0   metoprolol  tartrate (LOPRESSOR ) 25 MG tablet, Take 1 tablet (25 mg total) by mouth 2 (two)  times daily., Disp: 180 tablet, Rfl: 1   Multiple Vitamin (MULTIVITAMIN) tablet, Take 1 tablet by mouth daily., Disp: , Rfl:    potassium chloride  (KLOR-CON ) 10 MEQ tablet, Take 1 tablet (10 mEq total) by mouth daily., Disp: 90  tablet, Rfl: 1   Physical Exam:   BP 136/77   Pulse 83   SpO2 91%   Salient findings:  CN II-XII intact Bilateral EAC clear and TM intact with well pneumatized middle ear spaces; mild eczematoid change b/l Anterior rhinoscopy: Septum intact; bilateral inferior turbinates without significant hypertrophy No lesions of oral cavity/oropharynx; dentition - edentulous maxillary, mandibular dentition fair; easily expressible saliva b/l parotid glands No obviously palpable lymphadenopathy/thyromegaly; well healed midline neck scar; noted left parotid tail well demarcated and mobile mass, which is unchanged in size; not tender; no other lesions appreciated over parotid No respiratory distress or stridor No concerning skin lesions over face or neck  Seprately Identifiable Procedures:   None today  Impression & Plans:  Shabreka Coulon is a 78 y.o. female with:  1. Chronic eczematous otitis externa of both ears   2. Warthin's tumor    Left what appears to be parotid nodule and based on imaging - intermittently waxes/wanes in size as well. Unclear what is causing size to wax/wane but given persistence. Biopsy shows Warthin's tumor.  We discussed options for this: We discussed risks including risks of anesthesia; risks of any surgery such as bleeding, infection; risks of the parotidectomy such as numbness, poor cosmetic outcome, facial nerve weakness (would not expect given postauricular and over parotid tail), Frey syndrome, first bite syndrome, sialocele, risks of any tumor surgery, such as recurrence, need for further procedures or other treatments.  Patient understands these --- she will think about this and let us  know how she'd like to proceed. Will do overnight obs with drain given age and coo-morbidities.  Otherwise I advised she needs f/u US  in 1 year. She will let us  know how she'd like to proceed  For ears, appears to be eczematoid OE: will start her on dermotic at bedtime for 14  days  See below regarding exact medications prescribed this encounter including dosages and route: Meds ordered this encounter  Medications   Fluocinolone Acetonide (DERMOTIC) 0.01 % OIL    Sig: Place 3 drops in ear(s) at bedtime for 14 days.    Dispense:  20 mL    Refill:  5      Thank you for allowing me the opportunity to care for your patient. Please do not hesitate to contact me should you have any other questions.  Sincerely, Milon Aloe, MD Otolaryngologist (ENT), [facility] Phone: [phone] Fax: [phone]  02/26/2024, 9:54 AM   I have personally spent 41 minutes involved in face-to-face and non-face-to-face activities for this patient on the day of the visit.  Professional time spent excludes any procedures performed but includes the following activities, in addition to those noted in the documentation: preparing to see the patient (review of outside documentation and results), performing a medically appropriate examination, extensive counseling, ordering medications (dermotic), documenting in the electronic health record

## 2024-03-04 ENCOUNTER — Other Ambulatory Visit: Payer: Self-pay | Admitting: Internal Medicine

## 2024-03-04 DIAGNOSIS — E1165 Type 2 diabetes mellitus with hyperglycemia: Secondary | ICD-10-CM

## 2024-04-01 ENCOUNTER — Telehealth: Payer: Self-pay | Admitting: Neurology

## 2024-04-01 NOTE — Telephone Encounter (Signed)
 Pt called to follow up with MD the patient  states that  Md was suppose to send order to Pt pharmacy for a 90 day supply. Pt states that Pharmacy has giving her 30 day supply Pt wants to speak to Md about change  docusate sodium  (COLACE) 50 MG capsule

## 2024-04-05 NOTE — Telephone Encounter (Signed)
 Called and LVM for pt to call PCP in regards to this medication refill she has requested.

## 2024-04-22 ENCOUNTER — Inpatient Hospital Stay (HOSPITAL_COMMUNITY)
Admission: EM | Admit: 2024-04-22 | Discharge: 2024-04-26 | DRG: 178 | Disposition: A | Discharge status: Home Health | Attending: Family Medicine | Admitting: Family Medicine

## 2024-04-22 ENCOUNTER — Other Ambulatory Visit: Payer: Self-pay

## 2024-04-22 ENCOUNTER — Emergency Department (HOSPITAL_COMMUNITY)

## 2024-04-22 ENCOUNTER — Ambulatory Visit
Admission: EM | Admit: 2024-04-22 | Discharge: 2024-04-22 | Disposition: A | Discharge status: Home / Self Care | Attending: Family Medicine | Admitting: Family Medicine

## 2024-04-22 ENCOUNTER — Encounter (HOSPITAL_COMMUNITY): Payer: Self-pay

## 2024-04-22 DIAGNOSIS — R531 Weakness: Secondary | ICD-10-CM | POA: Diagnosis not present

## 2024-04-22 DIAGNOSIS — Z8249 Family history of ischemic heart disease and other diseases of the circulatory system: Secondary | ICD-10-CM

## 2024-04-22 DIAGNOSIS — Z853 Personal history of malignant neoplasm of breast: Secondary | ICD-10-CM

## 2024-04-22 DIAGNOSIS — E785 Hyperlipidemia, unspecified: Secondary | ICD-10-CM | POA: Diagnosis present

## 2024-04-22 DIAGNOSIS — Z833 Family history of diabetes mellitus: Secondary | ICD-10-CM

## 2024-04-22 DIAGNOSIS — K589 Irritable bowel syndrome without diarrhea: Secondary | ICD-10-CM | POA: Diagnosis present

## 2024-04-22 DIAGNOSIS — K219 Gastro-esophageal reflux disease without esophagitis: Secondary | ICD-10-CM | POA: Diagnosis present

## 2024-04-22 DIAGNOSIS — K760 Fatty (change of) liver, not elsewhere classified: Secondary | ICD-10-CM | POA: Diagnosis present

## 2024-04-22 DIAGNOSIS — R0602 Shortness of breath: Secondary | ICD-10-CM

## 2024-04-22 DIAGNOSIS — E876 Hypokalemia: Secondary | ICD-10-CM | POA: Diagnosis not present

## 2024-04-22 DIAGNOSIS — Z981 Arthrodesis status: Secondary | ICD-10-CM

## 2024-04-22 DIAGNOSIS — U071 COVID-19: Principal | ICD-10-CM | POA: Diagnosis present

## 2024-04-22 DIAGNOSIS — Z87440 Personal history of urinary (tract) infections: Secondary | ICD-10-CM

## 2024-04-22 DIAGNOSIS — R1314 Dysphagia, pharyngoesophageal phase: Secondary | ICD-10-CM | POA: Diagnosis present

## 2024-04-22 DIAGNOSIS — Z8711 Personal history of peptic ulcer disease: Secondary | ICD-10-CM

## 2024-04-22 DIAGNOSIS — Z7984 Long term (current) use of oral hypoglycemic drugs: Secondary | ICD-10-CM

## 2024-04-22 DIAGNOSIS — R0689 Other abnormalities of breathing: Secondary | ICD-10-CM | POA: Diagnosis not present

## 2024-04-22 DIAGNOSIS — Z90710 Acquired absence of both cervix and uterus: Secondary | ICD-10-CM

## 2024-04-22 DIAGNOSIS — R0682 Tachypnea, not elsewhere classified: Secondary | ICD-10-CM

## 2024-04-22 DIAGNOSIS — Z794 Long term (current) use of insulin: Secondary | ICD-10-CM

## 2024-04-22 DIAGNOSIS — E1140 Type 2 diabetes mellitus with diabetic neuropathy, unspecified: Secondary | ICD-10-CM | POA: Diagnosis present

## 2024-04-22 DIAGNOSIS — E1165 Type 2 diabetes mellitus with hyperglycemia: Secondary | ICD-10-CM | POA: Diagnosis present

## 2024-04-22 DIAGNOSIS — Z961 Presence of intraocular lens: Secondary | ICD-10-CM | POA: Diagnosis present

## 2024-04-22 DIAGNOSIS — I110 Hypertensive heart disease with heart failure: Secondary | ICD-10-CM | POA: Diagnosis present

## 2024-04-22 DIAGNOSIS — R451 Restlessness and agitation: Secondary | ICD-10-CM | POA: Diagnosis not present

## 2024-04-22 DIAGNOSIS — G629 Polyneuropathy, unspecified: Secondary | ICD-10-CM

## 2024-04-22 DIAGNOSIS — I700 Atherosclerosis of aorta: Secondary | ICD-10-CM | POA: Diagnosis present

## 2024-04-22 DIAGNOSIS — I2510 Atherosclerotic heart disease of native coronary artery without angina pectoris: Secondary | ICD-10-CM | POA: Diagnosis present

## 2024-04-22 DIAGNOSIS — Z9221 Personal history of antineoplastic chemotherapy: Secondary | ICD-10-CM

## 2024-04-22 DIAGNOSIS — R5382 Chronic fatigue, unspecified: Secondary | ICD-10-CM | POA: Diagnosis present

## 2024-04-22 DIAGNOSIS — Z9841 Cataract extraction status, right eye: Secondary | ICD-10-CM

## 2024-04-22 DIAGNOSIS — I5032 Chronic diastolic (congestive) heart failure: Secondary | ICD-10-CM | POA: Diagnosis present

## 2024-04-22 DIAGNOSIS — R1319 Other dysphagia: Secondary | ICD-10-CM | POA: Diagnosis present

## 2024-04-22 DIAGNOSIS — K3184 Gastroparesis: Secondary | ICD-10-CM | POA: Diagnosis present

## 2024-04-22 DIAGNOSIS — E860 Dehydration: Secondary | ICD-10-CM | POA: Diagnosis present

## 2024-04-22 DIAGNOSIS — I10 Essential (primary) hypertension: Secondary | ICD-10-CM | POA: Diagnosis present

## 2024-04-22 DIAGNOSIS — Z888 Allergy status to other drugs, medicaments and biological substances status: Secondary | ICD-10-CM

## 2024-04-22 DIAGNOSIS — Z9842 Cataract extraction status, left eye: Secondary | ICD-10-CM

## 2024-04-22 DIAGNOSIS — Z87442 Personal history of urinary calculi: Secondary | ICD-10-CM

## 2024-04-22 DIAGNOSIS — Z800 Family history of malignant neoplasm of digestive organs: Secondary | ICD-10-CM

## 2024-04-22 DIAGNOSIS — Z79899 Other long term (current) drug therapy: Secondary | ICD-10-CM

## 2024-04-22 DIAGNOSIS — N179 Acute kidney failure, unspecified: Secondary | ICD-10-CM | POA: Diagnosis present

## 2024-04-22 DIAGNOSIS — Z923 Personal history of irradiation: Secondary | ICD-10-CM

## 2024-04-22 DIAGNOSIS — E1143 Type 2 diabetes mellitus with diabetic autonomic (poly)neuropathy: Secondary | ICD-10-CM | POA: Diagnosis present

## 2024-04-22 DIAGNOSIS — Z9011 Acquired absence of right breast and nipple: Secondary | ICD-10-CM

## 2024-04-22 LAB — CBC WITH DIFFERENTIAL/PLATELET
Abs Immature Granulocytes: 0.03 K/uL (ref 0.00–0.07)
Basophils Absolute: 0 K/uL (ref 0.0–0.1)
Basophils Relative: 0 %
Eosinophils Absolute: 0.1 K/uL (ref 0.0–0.5)
Eosinophils Relative: 1 %
HCT: 34.5 % — ABNORMAL LOW (ref 36.0–46.0)
Hemoglobin: 11.3 g/dL — ABNORMAL LOW (ref 12.0–15.0)
Immature Granulocytes: 0 %
Lymphocytes Relative: 29 %
Lymphs Abs: 2.9 K/uL (ref 0.7–4.0)
MCH: 26.8 pg (ref 26.0–34.0)
MCHC: 32.8 g/dL (ref 30.0–36.0)
MCV: 81.9 fL (ref 80.0–100.0)
Monocytes Absolute: 1 K/uL (ref 0.1–1.0)
Monocytes Relative: 11 %
Neutro Abs: 5.8 K/uL (ref 1.7–7.7)
Neutrophils Relative %: 59 %
Platelets: 225 K/uL (ref 150–400)
RBC: 4.21 MIL/uL (ref 3.87–5.11)
RDW: 14.6 % (ref 11.5–15.5)
WBC: 9.9 K/uL (ref 4.0–10.5)
nRBC: 0 % (ref 0.0–0.2)

## 2024-04-22 LAB — URINALYSIS, ROUTINE W REFLEX MICROSCOPIC
Bilirubin Urine: NEGATIVE
Glucose, UA: 150 mg/dL — AB
Hgb urine dipstick: NEGATIVE
Ketones, ur: NEGATIVE mg/dL
Leukocytes,Ua: NEGATIVE
Nitrite: NEGATIVE
Protein, ur: 100 mg/dL — AB
Specific Gravity, Urine: 1.019 (ref 1.005–1.030)
pH: 5 (ref 5.0–8.0)

## 2024-04-22 LAB — COMPREHENSIVE METABOLIC PANEL WITH GFR
ALT: 19 U/L (ref 0–44)
AST: 25 U/L (ref 15–41)
Albumin: 3.3 g/dL — ABNORMAL LOW (ref 3.5–5.0)
Alkaline Phosphatase: 62 U/L (ref 38–126)
Anion gap: 12 (ref 5–15)
BUN: 15 mg/dL (ref 8–23)
CO2: 24 mmol/L (ref 22–32)
Calcium: 8.4 mg/dL — ABNORMAL LOW (ref 8.9–10.3)
Chloride: 101 mmol/L (ref 98–111)
Creatinine, Ser: 1.53 mg/dL — ABNORMAL HIGH (ref 0.44–1.00)
GFR, Estimated: 35 mL/min — ABNORMAL LOW (ref >60)
Glucose, Bld: 217 mg/dL — ABNORMAL HIGH (ref 70–99)
Potassium: 3.7 mmol/L (ref 3.5–5.1)
Sodium: 137 mmol/L (ref 135–145)
Total Bilirubin: 1.2 mg/dL (ref 0.0–1.2)
Total Protein: 7.1 g/dL (ref 6.5–8.1)

## 2024-04-22 LAB — POC COVID19/FLU A&B COMBO
Covid Antigen, POC: POSITIVE — AB
Influenza A Antigen, POC: NEGATIVE
Influenza B Antigen, POC: NEGATIVE

## 2024-04-22 MED ORDER — BENZONATATE 100 MG PO CAPS
200.0000 mg | ORAL_CAPSULE | Freq: Once | ORAL | Status: AC
  Administered 2024-04-22: 200 mg via ORAL
  Filled 2024-04-22: qty 2

## 2024-04-22 MED ORDER — IPRATROPIUM-ALBUTEROL 0.5-2.5 (3) MG/3ML IN SOLN
3.0000 mL | Freq: Once | RESPIRATORY_TRACT | Status: AC
  Administered 2024-04-22: 3 mL via RESPIRATORY_TRACT
  Filled 2024-04-22: qty 3

## 2024-04-22 MED ORDER — ALBUTEROL SULFATE (2.5 MG/3ML) 0.083% IN NEBU
2.5000 mg | INHALATION_SOLUTION | Freq: Once | RESPIRATORY_TRACT | Status: AC
  Administered 2024-04-22: 2.5 mg via RESPIRATORY_TRACT

## 2024-04-22 NOTE — ED Triage Notes (Signed)
 Pt arrived via [facility] from Urgent Care for further evaluation and concern of Bronchospasms, possible stridor and testing Covid + today at their facility. Per Urgent Care RN, they attempted to administer a breathing treatment but stopped after 2-3 minutes due to Pt experiencing bronchospasms. Pt reports she has not been feeling well since Monday.

## 2024-04-22 NOTE — ED Triage Notes (Signed)
 Pt comes in for SOB. Pt is COVID +.  O2 100% 20G left AC from urgent care.

## 2024-04-22 NOTE — ED Notes (Signed)
 Pt ambulated with assistance Pt had to stop several times to catch breath- complaints of chest tightness as well Pt O2 94-96% RA HR up to 120

## 2024-04-22 NOTE — ED Provider Notes (Addendum)
 [facility]    CSN: [id number] Arrival date & time: 04/22/24  1500      History   Chief Complaint No chief complaint on file.   HPI [name] is a 78 y.o. female.   Patient with past medical history significant for heart failure, breast cancer, anemia, anxiety, diabetes, hypertension presenting today with 4-day history of progressively worsening sore throat, cough, weakness, fatigue and now significant shortness of breath and wheezing.  Denies fever, chest pain, abdominal pain, vomiting, diarrhea, rashes.  So far not tried anything over-the-counter for symptoms.  She denies any past chronic pulmonary issues and states she has never required breathing treatments or inhalers in the past.    Past Medical History:  Diagnosis Date   (HFpEF) heart failure with preserved ejection fraction (HCC)    Abdominal hernia 02/26/2012   unrepaired   Adenocarcinoma of breast (HCC) 1997   right / chemo + tamoxifen x 5 years    Anemia    Anxiety    Aortic atherosclerosis (HCC)    Arthritis    Blood transfusion 1980's   Breast cancer (HCC)    Cancer (HCC)    Phreesia 08/18/2020   Complication of anesthesia    has a hard time waking up; can't lay flat   Degenerative disc disease, lumbar    pressing on L3 and L4   Diabetes mellitus (HCC) 1989   Type 2 NIDDM; cancer treatment gave me diabetes   Diabetes mellitus without complication (HCC)    Phreesia 08/18/2020   Diverticulosis    DJD (degenerative joint disease) of lumbar spine    Dysrhythmia    irregular   Exertional dyspnea    Gastroparesis    GERD (gastroesophageal reflux disease)    mann   Heart murmur    think I outgrew it   Hepatic steatosis    History of kidney stones    History of stomach ulcers    Hx: UTI (urinary tract infection)    Hyperlipidemia    Hypersensitivity    in tongue   Hypertension    IBS (irritable bowel syndrome)    Insomnia    Internal hemorrhoids    Kidney cysts    RT  KIDNEY   Kidney stones 2009   s/p lithotripsy   Mild CAD    Mild valvular heart disease    Nephrolithiasis 04/13/2014   Obesity    Personal history of chemotherapy    Personal history of radiation therapy    PFO (patent foramen ovale)    PONV (postoperative nausea and vomiting)    Pulmonary nodule    Shortness of breath    unable to lie flat   Ventral hernia     Patient Active Problem List   Diagnosis Date Noted   Cyst of left parotid gland 12/01/2023   Neck mass 11/06/2023   Gait abnormality 12/19/2022   Peripheral neuropathy 09/17/2022   Right foot pain 05/02/2022   Hypokalemia 05/02/2022   Overactive bladder 02/08/2022   Left knee pain 02/08/2022   Right leg swelling 02/08/2022   Encounter for general adult medical examination with abnormal findings 08/30/2021   Chronic fatigue 08/30/2021   Vitamin B12 deficiency 08/30/2021   Statin myopathy 04/18/2021   Thyroid  nodule 01/16/2018   Overweight (BMI 25.0-29.9) 12/18/2017   Diabetic neuropathy (HCC) 10/13/2017   Genetic testing 11/28/2016   Esophageal dysphagia 09/30/2016   Ventral hernia 07/28/2015   Situational anxiety 04/26/2015   Thyromegaly 01/23/2015   Iron deficiency 06/17/2014  DDD (degenerative disc disease), lumbar 01/26/2014   Bulge of lumbar disc without myelopathy 01/26/2014   Renal cyst 01/26/2014   Back pain with left-sided sciatica 12/28/2013   Uncontrolled type 2 diabetes mellitus with hyperglycemia, without long-term current use of insulin  (HCC) 08/01/2013   GASTROPARESIS 12/15/2008   GERD (gastroesophageal reflux disease) 06/21/2008   IBS (irritable bowel syndrome) 06/21/2008   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 (HCC) 02/09/2008   Hyperlipidemia 02/09/2008   Essential hypertension 02/09/2008   Insomnia 02/09/2008    Past Surgical History:  Procedure Laterality Date   ABDOMINAL HYSTERECTOMY  2002   APPENDECTOMY     BACK SURGERY     Can't take any medical procedure in right arm   BREAST  BIOPSY     right   BREAST SURGERY N/A    Phreesia 04/22/2020   CATARACT EXTRACTION W/ INTRAOCULAR LENS  IMPLANT, BILATERAL  2009-2010   CESAREAN SECTION  1973; 1978; 1981   CESAREAN SECTION N/A    Phreesia 04/22/2020   COLONOSCOPY WITH PROPOFOL  N/A 10/29/2016   Procedure: COLONOSCOPY WITH PROPOFOL ;  Surgeon: Margo LITTIE Haddock, MD;  Location: [facility];  Service: Endoscopy;  Laterality: N/A;  10:00 am   CYSTOSCOPY Left 04/13/2014   Procedure: CYSTOSCOPY FLEXIBLE;  Surgeon: Morene LELON Salines, MD;  Location: [facility];  Service: Urology;  Laterality: Left;  with STENT   ESOPHAGOGASTRODUODENOSCOPY (EGD) WITH PROPOFOL  N/A 10/29/2016   Procedure: ESOPHAGOGASTRODUODENOSCOPY (EGD) WITH PROPOFOL ;  Surgeon: Margo LITTIE Haddock, MD;  Location: [facility];  Service: Endoscopy;  Laterality: N/A;   EYE SURGERY     implant and screws put in the right lower jaw  11/2008   dental surgery   kidney blockage  ?1990's    major surgery ;put kidney on pump for awhile   LITHOTRIPSY     several times   LUMBAR FUSION  08/2010   MASTECTOMY  1997   right   NEPHROLITHOTOMY Left 04/13/2014   Procedure: LEFT PERCUTANEOUS NEPHROLITHOTOMY WITH SURGEON ACCESS;  Surgeon: Morene LELON Salines, MD;  Location: [facility];  Service: Urology;  Laterality: Left;   OVARIAN CYST SURGERY     PARATHYROIDECTOMY  02/26/2012   Procedure: PARATHYROIDECTOMY;  Surgeon: Ana LELON Moccasin, MD;  [facility];  Service: ENT;;   SAVORY DILATION N/A 10/29/2016   Procedure: SAVORY DILATION;  Surgeon: Margo LITTIE Haddock, MD;  Location: [facility];  Service: Endoscopy;  Laterality: N/A;   SPINE SURGERY  2011   urological surgery for blocked ureter secondary to kidney stone      OB History   No obstetric history on file.      Home Medications    Prior to Admission medications   Medication Sig Start Date End Date Taking? Authorizing Provider  acetaminophen  (TYLENOL ) 325 MG tablet Take 325 mg by mouth every 6 (six) hours as needed for mild pain.     [provider]  amLODipine  (NORVASC ) 5 MG tablet Take 1 tablet (5 mg total) by mouth daily. 11/06/23   [name], MD  blood glucose meter kit and supplies KIT Dispense based on patient and insurance preference. Use up to four times daily as directed. 06/17/23   [name], MD  Blood Glucose Monitoring Suppl (BLOOD GLUCOSE SYSTEM PAK) KIT Use as directed to monitor FSBS 1x daily. Dx: E11.9 01/16/18   Bari Theodoro FALCON, MD  Cholecalciferol (VITAMIN D ) 50 MCG (2000 UT) CAPS Take 1 capsule (2,000 Units total) by mouth daily. 01/05/24   Lamon,  Pleasant HERO, PA-C  CINNAMON PO Take 1 capsule by mouth. Takes sometimes    [provider]  Continuous Glucose Sensor (FREESTYLE LIBRE 3 PLUS SENSOR) MISC Change sensor every 15 days. 02/06/24   [name], MD  docusate sodium  (COLACE) 50 MG capsule Take 1 capsule (50 mg total) by mouth daily as needed for mild constipation. 01/05/24   Lamon Pleasant HERO, PA-C  DULoxetine  (CYMBALTA ) 60 MG capsule Take 1 capsule (60 mg total) by mouth daily. 12/17/23   Gregg Lek, MD  ferrous sulfate  325 (65 FE) MG EC tablet Take 1 tablet (325 mg total) by mouth every other day. 01/05/24   Lamon Pleasant HERO, PA-C  furosemide  (LASIX ) 40 MG tablet Take 1 tablet (40 mg total) by mouth daily as needed (FOR SWELLING). 07/29/22   Dunn, Dayna N, PA-C  glipiZIDE  (GLUCOTROL ) 10 MG tablet Take 1 tablet (10 mg total) by mouth 2 (two) times daily before a meal. 05/02/23   Tobie, Suzzane POUR, MD  glucose blood (ACCU-CHEK GUIDE) test strip 1 each by Other route as needed for other. Use as instructed to check blood sugar daily. 03/09/18   Von Pacific, MD  insulin  glargine (LANTUS  SOLOSTAR) 100 UNIT/ML Solostar Pen Inject 20 Units into the skin 2 (two) times daily. 02/06/24   [name], MD  Insulin  Pen Needle 32G X 4 MM MISC Use as directed for insulin  administration. 06/17/23   [name], MD  LANCETS SUPER THIN 28G MISC Use to check blood sugar once daily.  03/09/18   Von [place], MD  metFORMIN  (GLUCOPHAGE -XR) 500 MG 24 hr tablet TAKE ONE TABLET BY MOUTH ONCE DAILY WITH BREAKFAST. 03/04/24   [name], MD  metoprolol  tartrate (LOPRESSOR ) 25 MG tablet Take 1 tablet (25 mg total) by mouth 2 (two) times daily. 02/06/24   [name], MD  Multiple Vitamin (MULTIVITAMIN) tablet Take 1 tablet by mouth daily.    [provider]  potassium chloride  (KLOR-CON ) 10 MEQ tablet Take 1 tablet (10 mEq total) by mouth daily. 02/12/22   Paseda, Folashade R, FNP    Family History Family History  Problem Relation Age of Onset   Birth defects Mother    Heart attack Mother    Cancer Brother        Esophageal; smoker; deceased 25s   Colon cancer Brother    Diabetes Daughter    Leukemia Paternal Aunt        deceased 33s   Pancreatic cancer Paternal Aunt        deceased 10   Cancer Paternal Aunt        GI cancer; deceased 22s   Cancer Cousin        kidney cancer; deceased 86; pat first cousin; daughter of aunt w/ leukemia   Cancer Cousin        colon cancer @ 65; pat first cousin; son of aunt with GI cancer   Anesthesia problems Neg Hx     Social History Social History   Tobacco Use   Smoking status: Never   Smokeless tobacco: Never  Vaping Use   Vaping status: Never Used  Substance Use Topics   Alcohol  use: Yes    Alcohol /week: 0.0 standard drinks of alcohol     Comment: X2 DRINKS PER YEAR   Drug use: No     Allergies   Demerol [meperidine], Bentyl  [dicyclomine  hcl], Invokana [canagliflozin], Metformin  and related, Protonix  [pantoprazole  sodium], Statins, and Ace inhibitors   Review of Systems Review  of Systems Per HPI  Physical Exam Triage Vital Signs ED Triage Vitals  Encounter Vitals Group     BP 04/22/24 1510 114/75     Girls Systolic BP Percentile --      Girls Diastolic BP Percentile --      Boys Systolic BP Percentile --      Boys Diastolic BP Percentile --      Pulse Rate 04/22/24 1510 88     Resp 04/22/24  1510 (!) 26     Temp 04/22/24 1510 98.3 F (36.8 C)     Temp Source 04/22/24 1510 Oral     SpO2 04/22/24 1510 93 %     Weight --      Height --      Head Circumference --      Peak Flow --      Pain Score 04/22/24 1514 0     Pain Loc --      Pain Education --      Exclude from Growth Chart --    No data found.  Updated Vital Signs BP 114/75 (BP Location: Right Arm)   Pulse 88   Temp 98.3 F (36.8 C) (Oral)   Resp (!) 26   SpO2 93%   Visual Acuity Right Eye Distance:   Left Eye Distance:   Bilateral Distance:    Right Eye Near:   Left Eye Near:    Bilateral Near:     Physical Exam Vitals and nursing note reviewed.  Constitutional:      General: She is in acute distress.     Appearance: She is ill-appearing.  HENT:     Head: Atraumatic.     Mouth/Throat:     Mouth: Mucous membranes are moist.  Eyes:     Extraocular Movements: Extraocular movements intact.     Conjunctiva/sclera: Conjunctivae normal.  Cardiovascular:     Rate and Rhythm: Normal rate and regular rhythm.     Heart sounds: Normal heart sounds.  Pulmonary:     Comments: Significantly labored breathing, was only moderately tachypneic but not able to speak in full sentences prior to brief moment of albuterol  nebulizer treatment but very soon after starting the albuterol  treatment she began having gasping episodes, stridorous episodes and what appears to be bronchospasms.  Was able to speak maybe 1 word with great labor at a time post nebulizer treatment.  Significant wheezes bilaterally Musculoskeletal:        General: Normal range of motion.     Cervical back: Normal range of motion and neck supple.  Skin:    General: Skin is warm and dry.  Neurological:     Mental Status: She is alert and oriented to person, place, and time.  Psychiatric:     Comments: Anxious      UC Treatments / Results  Labs (all labs ordered are listed, but only abnormal results are displayed) Labs Reviewed  POC  COVID19/FLU A&B COMBO - Abnormal; Notable for the following components:      Result Value   Covid Antigen, POC Positive (*)    All other components within normal limits    EKG   Radiology No results found.  Procedures Procedures (including critical care time)  Medications Ordered in UC Medications  albuterol  (PROVENTIL ) (2.5 MG/3ML) 0.083% nebulizer solution 2.5 mg (2.5 mg Nebulization Given 04/22/24 1529)    Initial Impression / Assessment and Plan / UC Course  I have reviewed the triage vital signs and the nursing notes.  Pertinent labs & imaging results that were available during my care of the patient were reviewed by me and considered in my medical decision making (see chart for details).     Rapid COVID test positive.  Patient was in mild to moderate distress in triage, due to significant wheezes and tachypnea, shortness of breath albuterol  nebulizer treatment was started but patient very quickly became intolerant to this medication and so treatment was discontinued after about 12 seconds.  Patient began gasping and having what appears to be bronchospasm type episodes and significant weakness.  She became nearly non-responsive (eyes closed, slumped over and not responding to verbal stimuli) very momentarily at time of removal of albuterol  neb mask but was able to be roused.  After about 5 minutes of gasping, stridorous type bronchospasms she was able to slow her breathing and reduce the episodes and eventually able to speak in one-word statements but still significantly labored breathing. Her blood sugar currently is 296 per her libre meter so we will forego any steroid shots at the moment.  Unclear if worsening course post albuterol  may indicate an albuterol  allergy versus triggering severe bronchospasms.  She has never had albuterol  in the past per patient so no comparison point.  EMS was called as soon as albuterol  mask was taken off and patient became acutely worse.  Patient  oxygen saturation remained around 98% on room air so supplemental O2 was not utilized throughout duration of episode.  EMS arrived and transported to the emergency department for further evaluation.  EKG was deferred to ED at this time given severe respiratory distress for concern of laying her back during gasping episodes causing worsening of already heavily labored breathing.  Final Clinical Impressions(s) / UC Diagnoses   Final diagnoses:  SOB (shortness of breath)  Gasping for breath  Tachypnea  Weakness  COVID-19   Discharge Instructions   None    ED Prescriptions   None    PDMP not reviewed this encounter.   Stuart Vernell Norris, PA-C 04/22/24 1606    [address] [place], [place] 04/22/24 [phone]

## 2024-04-22 NOTE — ED Notes (Signed)
 Patient is being discharged from the Urgent Care and sent to the Emergency Department via EMS . Per PA, patient is in need of higher level of care due to Bronchospasm/Resp Distress. Patient is aware and verbalizes understanding of plan of care.  Vitals:   04/22/24 1510  BP: 114/75  Pulse: 88  Resp: (!) 26  Temp: 98.3 F (36.8 C)  SpO2: 93%

## 2024-04-22 NOTE — ED Notes (Addendum)
 Pt unable to tolerate breathing treatment or EKG. PA in room.   Pt noted to have respiratory distress, bronchospasm. Attempted IV x2. Initial attempt in left forearm. Second attempt successful in left AC.   Sister notified by CMA of pt transport. Unable to reach spouse. Rad [employer] notified EMS.

## 2024-04-22 NOTE — ED Triage Notes (Signed)
 Pt reports weakness, fatigue, dizziness cough sore throat, SOB x 3 days has progressed over the week. Pt states she is feeling awful. Pt has tried OTC medications but has found no relief.

## 2024-04-22 NOTE — ED Notes (Signed)
 Report given to [facility] ED charge. ED Reported EMS encoded pt alert, VSS stable, covid positive and pt was being sent to triage.  This RN expressed concerns to ED for breathing issues at pt departure from UC. ED also updated on pt presentation and IV access.

## 2024-04-22 NOTE — ED Notes (Signed)
 When pt initally got out of bed oxygen was 88% and she was SOB. She continued to be SOB when walking however, oxygen rose to 92% and remained.

## 2024-04-22 NOTE — ED Provider Notes (Signed)
 [place] [facility] Provider Note   CSN: [id number] Arrival date & time: 04/22/24  1605     Patient presents with: Shortness of Breath   [name] is a 78 y.o. female.  {Add pertinent medical, surgical, social history, OB history to HPI:32947}  Shortness of Breath Associated symptoms: cough, fever, headaches and sore throat   Associated symptoms: no abdominal pain, no chest pain, no neck pain, no rash and no vomiting        [name] is a 78 y.o. female past history of hypertension, GERD, gastroparesis, type 2 diabetes, breast cancer, heart failure and valvular disease who presents to the Emergency Department complaining of fatigue, cough, generalized weakness, intermittent headache and exertional dyspnea.  Was seen earlier today at a local urgent care had positive COVID test.  Patient endorses not feeling well x 4 days.  Symptoms have been gradually worsening.  Subjective fever at home, she has also been experiencing some diarrhea and sore throat.  She denies any abdominal pain or vomiting.  No dysuria symptoms.    Prior to Admission medications   Medication Sig Start Date End Date Taking? Authorizing Provider  acetaminophen  (TYLENOL ) 325 MG tablet Take 325 mg by mouth every 6 (six) hours as needed for mild pain.    [provider]  amLODipine  (NORVASC ) 5 MG tablet Take 1 tablet (5 mg total) by mouth daily. 11/06/23   [name], MD  blood glucose meter kit and supplies KIT Dispense based on patient and insurance preference. Use up to four times daily as directed. 06/17/23   [name], MD  Blood Glucose Monitoring Suppl (BLOOD GLUCOSE SYSTEM PAK) KIT Use as directed to monitor FSBS 1x daily. Dx: E11.9 01/16/18   Bari Theodoro FALCON, MD  Cholecalciferol (VITAMIN D ) 50 MCG (2000 UT) CAPS Take 1 capsule (2,000 Units total) by mouth daily. 01/05/24   Lamon Pleasant HERO, PA-C  CINNAMON PO Take 1 capsule by mouth. Takes  sometimes    [provider]  Continuous Glucose Sensor (FREESTYLE LIBRE 3 PLUS SENSOR) MISC Change sensor every 15 days. 02/06/24   [name], MD  docusate sodium  (COLACE) 50 MG capsule Take 1 capsule (50 mg total) by mouth daily as needed for mild constipation. 01/05/24   Lamon Pleasant HERO, PA-C  DULoxetine  (CYMBALTA ) 60 MG capsule Take 1 capsule (60 mg total) by mouth daily. 12/17/23   Gregg Lek, MD  ferrous sulfate  325 (65 FE) MG EC tablet Take 1 tablet (325 mg total) by mouth every other day. 01/05/24   Lamon Pleasant HERO, PA-C  furosemide  (LASIX ) 40 MG tablet Take 1 tablet (40 mg total) by mouth daily as needed (FOR SWELLING). 07/29/22   Dunn, Dayna N, PA-C  glipiZIDE  (GLUCOTROL ) 10 MG tablet Take 1 tablet (10 mg total) by mouth 2 (two) times daily before a meal. 05/02/23   Tobie, Suzzane POUR, MD  glucose blood (ACCU-CHEK GUIDE) test strip 1 each by Other route as needed for other. Use as instructed to check blood sugar daily. 03/09/18   Von [place], MD  insulin  glargine (LANTUS  SOLOSTAR) 100 UNIT/ML Solostar Pen Inject 20 Units into the skin 2 (two) times daily. 02/06/24   [name], MD  Insulin  Pen Needle 32G X 4 MM MISC Use as directed for insulin  administration. 06/17/23   [name], MD  LANCETS SUPER THIN 28G MISC Use to check blood sugar once daily. 03/09/18   Von [place], MD  metFORMIN  (GLUCOPHAGE -XR) 500 MG 24 hr tablet TAKE ONE TABLET BY MOUTH ONCE DAILY WITH BREAKFAST. 03/04/24   [name], MD  metoprolol  tartrate (LOPRESSOR ) 25 MG tablet Take 1 tablet (25 mg total) by mouth 2 (two) times daily. 02/06/24   [name], MD  Multiple Vitamin (MULTIVITAMIN) tablet Take 1 tablet by mouth daily.    [provider]  potassium chloride  (KLOR-CON ) 10 MEQ tablet Take 1 tablet (10 mEq total) by mouth daily. 02/12/22   Paseda, Folashade R, FNP    Allergies: Demerol [meperidine], Bentyl  [dicyclomine  hcl], Invokana [canagliflozin], Metformin  and related,  Protonix  [pantoprazole  sodium], Statins, and Ace inhibitors    Review of Systems  Constitutional:  Positive for fever. Negative for chills.  HENT:  Positive for sore throat. Negative for congestion.   Respiratory:  Positive for cough and shortness of breath.   Cardiovascular:  Negative for chest pain.  Gastrointestinal:  Positive for diarrhea. Negative for abdominal pain, nausea and vomiting.  Genitourinary:  Negative for difficulty urinating and dysuria.  Musculoskeletal:  Negative for arthralgias, back pain, neck pain and neck stiffness.  Skin:  Negative for rash.  Neurological:  Positive for weakness and headaches. Negative for dizziness, syncope, facial asymmetry and numbness.    Updated Vital Signs BP 107/70   Pulse 91   Temp 98.3 F (36.8 C)   Resp 18   Ht 5' 6 (1.676 m)   Wt 77.1 kg   SpO2 97%   BMI 27.44 kg/m   Physical Exam Vitals and nursing note reviewed.  Constitutional:      General: She is not in acute distress.    Appearance: She is well-developed. She is not ill-appearing or toxic-appearing.  HENT:     Mouth/Throat:     Mouth: Mucous membranes are moist.  Eyes:     Extraocular Movements: Extraocular movements intact.     Conjunctiva/sclera: Conjunctivae normal.     Pupils: Pupils are equal, round, and reactive to light.  Cardiovascular:     Rate and Rhythm: Normal rate and regular rhythm.     Pulses: Normal pulses.  Pulmonary:     Effort: Pulmonary effort is normal. No respiratory distress.     Breath sounds: No wheezing or rales.  Abdominal:     Palpations: Abdomen is soft.     Tenderness: There is no abdominal tenderness.  Musculoskeletal:        General: Normal range of motion.     Cervical back: Normal range of motion. No rigidity or tenderness.     Right lower leg: No edema.     Left lower leg: No edema.  Lymphadenopathy:     Cervical: No cervical adenopathy.  Skin:    General: Skin is warm.     Capillary Refill: Capillary refill takes  less than 2 seconds.  Neurological:     General: No focal deficit present.     Mental Status: She is alert.     Sensory: No sensory deficit.     Motor: No weakness.     (all labs ordered are listed, but only abnormal results are displayed) Labs Reviewed  CBC WITH DIFFERENTIAL/PLATELET  COMPREHENSIVE METABOLIC PANEL WITH GFR  URINALYSIS, ROUTINE W REFLEX MICROSCOPIC    EKG: None  Radiology: DG Chest Portable 1 View Result Date: 04/22/2024 CLINICAL DATA:  Cough, COVID positive EXAM: PORTABLE CHEST 1 VIEW COMPARISON:  10/13/2017 FINDINGS: Heart and mediastinal contours within normal limits. Right lung clear. Linear densities at the left base likely reflect atelectasis.  No effusions or pneumothorax. No acute bony abnormality. IMPRESSION: Left base atelectasis. Electronically Signed   By: Franky Crease M.D.   On: 04/22/2024 18:21    {Document cardiac monitor, telemetry assessment procedure when appropriate:32947} Procedures   Medications Ordered in the ED  ipratropium-albuterol  (DUONEB) 0.5-2.5 (3) MG/3ML nebulizer solution 3 mL (3 mLs Nebulization Given 04/22/24 1814)      {Click here for ABCD2, HEART and other calculators REFRESH Note before signing:1}                              Medical Decision Making Patient here from local urgent care for evaluation of positive COVID, cough, generalized weakness and exertional dyspnea.  Patient endorses symptoms for 4 days.  Gradually worsening.  Some diarrhea but no abdominal pain chest pain or vomiting.  No dysuria symptoms.  She had positive COVID test at urgent care earlier today.  Suspect symptoms are secondary to her COVID diagnosis, electrolyte abnormality, underlying pneumonia or other infectious process also considered.  Amount and/or Complexity of Data Reviewed External Data Reviewed: labs.    Details: Patient has positive COVID test documented in epic from earlier today at urgent care Labs: ordered. Radiology:  ordered.  Risk Prescription drug management.     {Document critical care time when appropriate  Document review of labs and clinical decision tools ie CHADS2VASC2, etc  Document your independent review of radiology images and any outside records  Document your discussion with family members, caretakers and with consultants  Document social determinants of health affecting pt's care  Document your decision making why or why not admission, treatments were needed:32947:::1}   Final diagnoses:  None    ED Discharge Orders     None

## 2024-04-22 NOTE — ED Provider Notes (Incomplete)
  EMERGENCY DEPARTMENT AT [facility] Provider Note   CSN: [id number] Arrival date & time: 04/22/24  1605     Patient presents with: Shortness of Breath   [name] is a 78 y.o. female.  {Add pertinent medical, surgical, social history, OB history to HPI:32947}  Shortness of Breath Associated symptoms: cough, fever, headaches and sore throat   Associated symptoms: no abdominal pain, no chest pain, no neck pain, no rash and no vomiting        [name] is a 78 y.o. female past history of hypertension, GERD, gastroparesis, type 2 diabetes, breast cancer, heart failure and valvular disease who presents to the Emergency Department complaining of fatigue, cough, generalized weakness, intermittent headache and exertional dyspnea.  Was seen earlier today at a local urgent care had positive COVID test.  Patient endorses not feeling well x 4 days.  Symptoms have been gradually worsening.  Subjective fever at home, she has also been experiencing some diarrhea and sore throat.  She denies any abdominal pain, chest pain or vomiting.  No dysuria symptoms.    Prior to Admission medications   Medication Sig Start Date End Date Taking? Authorizing Provider  acetaminophen  (TYLENOL ) 325 MG tablet Take 325 mg by mouth every 6 (six) hours as needed for mild pain.    [provider]  amLODipine  (NORVASC ) 5 MG tablet Take 1 tablet (5 mg total) by mouth daily. 11/06/23   [name], MD  blood glucose meter kit and supplies KIT Dispense based on patient and insurance preference. Use up to four times daily as directed. 06/17/23   [name], MD  Blood Glucose Monitoring Suppl (BLOOD GLUCOSE SYSTEM PAK) KIT Use as directed to monitor FSBS 1x daily. Dx: E11.9 01/16/18   Bari Theodoro FALCON, MD  Cholecalciferol (VITAMIN D ) 50 MCG (2000 UT) CAPS Take 1 capsule (2,000 Units total) by mouth daily. 01/05/24   Lamon Pleasant HERO, PA-C  CINNAMON PO Take 1 capsule by  mouth. Takes sometimes    [provider]  Continuous Glucose Sensor (FREESTYLE LIBRE 3 PLUS SENSOR) MISC Change sensor every 15 days. 02/06/24   [name], MD  docusate sodium  (COLACE) 50 MG capsule Take 1 capsule (50 mg total) by mouth daily as needed for mild constipation. 01/05/24   Lamon Pleasant HERO, PA-C  DULoxetine  (CYMBALTA ) 60 MG capsule Take 1 capsule (60 mg total) by mouth daily. 12/17/23   Gregg Lek, MD  ferrous sulfate  325 (65 FE) MG EC tablet Take 1 tablet (325 mg total) by mouth every other day. 01/05/24   Lamon Pleasant HERO, PA-C  furosemide  (LASIX ) 40 MG tablet Take 1 tablet (40 mg total) by mouth daily as needed (FOR SWELLING). 07/29/22   Dunn, Dayna N, PA-C  glipiZIDE  (GLUCOTROL ) 10 MG tablet Take 1 tablet (10 mg total) by mouth 2 (two) times daily before a meal. 05/02/23   Tobie, Suzzane POUR, MD  glucose blood (ACCU-CHEK GUIDE) test strip 1 each by Other route as needed for other. Use as instructed to check blood sugar daily. 03/09/18   Von Pacific, MD  insulin  glargine (LANTUS  SOLOSTAR) 100 UNIT/ML Solostar Pen Inject 20 Units into the skin 2 (two) times daily. 02/06/24   [name], MD  Insulin  Pen Needle 32G X 4 MM MISC Use as directed for insulin  administration. 06/17/23   [name], MD  LANCETS SUPER THIN 28G MISC Use to check blood sugar once daily. 03/09/18   Von,  Salena, MD  metFORMIN  (GLUCOPHAGE -XR) 500 MG 24 hr tablet TAKE ONE TABLET BY MOUTH ONCE DAILY WITH BREAKFAST. 03/04/24   [name], MD  metoprolol  tartrate (LOPRESSOR ) 25 MG tablet Take 1 tablet (25 mg total) by mouth 2 (two) times daily. 02/06/24   [name], MD  Multiple Vitamin (MULTIVITAMIN) tablet Take 1 tablet by mouth daily.    [provider]  potassium chloride  (KLOR-CON ) 10 MEQ tablet Take 1 tablet (10 mEq total) by mouth daily. 02/12/22   Paseda, Folashade R, FNP    Allergies: Demerol [meperidine], Bentyl  [dicyclomine  hcl], Invokana [canagliflozin], Metformin   and related, Protonix  [pantoprazole  sodium], Statins, and Ace inhibitors    Review of Systems  Constitutional:  Positive for fever. Negative for chills.  HENT:  Positive for sore throat. Negative for congestion.   Respiratory:  Positive for cough and shortness of breath.   Cardiovascular:  Negative for chest pain.  Gastrointestinal:  Positive for diarrhea. Negative for abdominal pain, nausea and vomiting.  Genitourinary:  Negative for difficulty urinating and dysuria.  Musculoskeletal:  Negative for arthralgias, back pain, neck pain and neck stiffness.  Skin:  Negative for rash.  Neurological:  Positive for weakness and headaches. Negative for dizziness, syncope, facial asymmetry and numbness.    Updated Vital Signs BP 107/70   Pulse 91   Temp 98.3 F (36.8 C)   Resp 18   Ht 5' 6 (1.676 m)   Wt 77.1 kg   SpO2 97%   BMI 27.44 kg/m   Physical Exam Vitals and nursing note reviewed.  Constitutional:      General: She is not in acute distress.    Appearance: She is well-developed. She is not ill-appearing or toxic-appearing.  HENT:     Mouth/Throat:     Mouth: Mucous membranes are moist.  Eyes:     Extraocular Movements: Extraocular movements intact.     Conjunctiva/sclera: Conjunctivae normal.     Pupils: Pupils are equal, round, and reactive to light.  Cardiovascular:     Rate and Rhythm: Normal rate and regular rhythm.     Pulses: Normal pulses.  Pulmonary:     Effort: No respiratory distress.     Breath sounds: No wheezing or rales.     Comments: Slightly diminished lung sounds bilaterally.  Increased work of breathing on exam Abdominal:     Palpations: Abdomen is soft.     Tenderness: There is no abdominal tenderness.  Musculoskeletal:        General: Normal range of motion.     Cervical back: Normal range of motion. No rigidity or tenderness.     Right lower leg: No edema.     Left lower leg: No edema.  Lymphadenopathy:     Cervical: No cervical adenopathy.   Skin:    General: Skin is warm.     Capillary Refill: Capillary refill takes less than 2 seconds.  Neurological:     General: No focal deficit present.     Mental Status: She is alert.     Sensory: No sensory deficit.     Motor: No weakness.     (all labs ordered are listed, but only abnormal results are displayed) Labs Reviewed  CBC WITH DIFFERENTIAL/PLATELET - Abnormal; Notable for the following components:      Result Value   Hemoglobin 11.3 (*)    HCT 34.5 (*)    All other components within normal limits  COMPREHENSIVE METABOLIC PANEL WITH GFR - Abnormal; Notable for the following  components:   Glucose, Bld 217 (*)    Creatinine, Ser 1.53 (*)    Calcium  8.4 (*)    Albumin 3.3 (*)    GFR, Estimated 35 (*)    All other components within normal limits  URINALYSIS, ROUTINE W REFLEX MICROSCOPIC - Abnormal; Notable for the following components:   Color, Urine AMBER (*)    APPearance CLOUDY (*)    Glucose, UA 150 (*)    Protein, ur 100 (*)    Bacteria, UA MANY (*)    All other components within normal limits    EKG: None  Radiology: DG Chest Portable 1 View Result Date: 04/22/2024 CLINICAL DATA:  Cough, COVID positive EXAM: PORTABLE CHEST 1 VIEW COMPARISON:  10/13/2017 FINDINGS: Heart and mediastinal contours within normal limits. Right lung clear. Linear densities at the left base likely reflect atelectasis. No effusions or pneumothorax. No acute bony abnormality. IMPRESSION: Left base atelectasis. Electronically Signed   By: Franky Crease M.D.   On: 04/22/2024 18:21    {Document cardiac monitor, telemetry assessment procedure when appropriate:32947} Procedures   Medications Ordered in the ED  ipratropium-albuterol  (DUONEB) 0.5-2.5 (3) MG/3ML nebulizer solution 3 mL (3 mLs Nebulization Given 04/22/24 1814)  benzonatate  (TESSALON ) capsule 200 mg (200 mg Oral Given 04/22/24 2033)  ipratropium-albuterol  (DUONEB) 0.5-2.5 (3) MG/3ML nebulizer solution 3 mL (3 mLs Nebulization  Given 04/22/24 2312)      {Click here for ABCD2, HEART and other calculators REFRESH Note before signing:1}                              Medical Decision Making Patient here from local urgent care for evaluation of positive COVID, cough, generalized weakness and exertional dyspnea.  Patient endorses symptoms for 4 days.  Gradually worsening.  Some diarrhea but no abdominal pain chest pain or vomiting.  No dysuria symptoms.  She had positive COVID test at urgent care earlier today.  Suspect symptoms are secondary to her COVID diagnosis, electrolyte abnormality, underlying pneumonia or other infectious process also considered.  Amount and/or Complexity of Data Reviewed External Data Reviewed: labs.    Details: Patient has positive COVID test documented in epic from earlier today at urgent care Labs: ordered.    Details: No leukocytosis urinalysis shows cloudy urine with 6-10 red cells 0-5 white cells and many bacteria.  Likely contaminant.  Patient without dysuria symptoms. Radiology: ordered.    Details: Chest x-ray with left base atelectasis, no effusions ECG/medicine tests: ordered.    Details: EKG shows sinus rhythm left axis deviation probable left atrial enlargement Discussion of management or test interpretation with external provider(s): Patient here COVID-positive symptoms for 4 days.  Endorses shortness of breath that is exertional and nonproductive cough.  Chest x-ray here shows likely atelectasis.  No fever or leukocytosis.  I did not appreciate any wheezing on lung exam.  Patient given Tessalon  and albuterol  neb here, attempted to ambulate in the department was hypoxic upon standing but maintained oxygen in the low 90s while ambulating but very labored breathing.  Second neb was given and she attempted to ambulate again continues to be very dyspneic.  Risk Prescription drug management.     {Document critical care time when appropriate  Document review of labs and clinical  decision tools ie CHADS2VASC2, etc  Document your independent review of radiology images and any outside records  Document your discussion with family members, caretakers and with consultants  Document social determinants of  health affecting pt's care  Document your decision making why or why not admission, treatments were needed:32947:::1}   Final diagnoses:  None    ED Discharge Orders     None

## 2024-04-23 ENCOUNTER — Observation Stay (HOSPITAL_COMMUNITY)

## 2024-04-23 ENCOUNTER — Encounter (HOSPITAL_COMMUNITY): Payer: Self-pay | Admitting: Family Medicine

## 2024-04-23 DIAGNOSIS — Z794 Long term (current) use of insulin: Secondary | ICD-10-CM | POA: Diagnosis not present

## 2024-04-23 DIAGNOSIS — I700 Atherosclerosis of aorta: Secondary | ICD-10-CM | POA: Diagnosis present

## 2024-04-23 DIAGNOSIS — R1314 Dysphagia, pharyngoesophageal phase: Secondary | ICD-10-CM | POA: Diagnosis present

## 2024-04-23 DIAGNOSIS — Z7984 Long term (current) use of oral hypoglycemic drugs: Secondary | ICD-10-CM | POA: Diagnosis not present

## 2024-04-23 DIAGNOSIS — E860 Dehydration: Secondary | ICD-10-CM | POA: Diagnosis present

## 2024-04-23 DIAGNOSIS — Z853 Personal history of malignant neoplasm of breast: Secondary | ICD-10-CM | POA: Diagnosis not present

## 2024-04-23 DIAGNOSIS — E785 Hyperlipidemia, unspecified: Secondary | ICD-10-CM | POA: Diagnosis present

## 2024-04-23 DIAGNOSIS — I10 Essential (primary) hypertension: Secondary | ICD-10-CM

## 2024-04-23 DIAGNOSIS — U071 COVID-19: Principal | ICD-10-CM

## 2024-04-23 DIAGNOSIS — R531 Weakness: Secondary | ICD-10-CM | POA: Diagnosis not present

## 2024-04-23 DIAGNOSIS — K760 Fatty (change of) liver, not elsewhere classified: Secondary | ICD-10-CM | POA: Diagnosis present

## 2024-04-23 DIAGNOSIS — N179 Acute kidney failure, unspecified: Secondary | ICD-10-CM

## 2024-04-23 DIAGNOSIS — K219 Gastro-esophageal reflux disease without esophagitis: Secondary | ICD-10-CM | POA: Diagnosis present

## 2024-04-23 DIAGNOSIS — I110 Hypertensive heart disease with heart failure: Secondary | ICD-10-CM | POA: Diagnosis present

## 2024-04-23 DIAGNOSIS — Z833 Family history of diabetes mellitus: Secondary | ICD-10-CM | POA: Diagnosis not present

## 2024-04-23 DIAGNOSIS — K3184 Gastroparesis: Secondary | ICD-10-CM | POA: Diagnosis present

## 2024-04-23 DIAGNOSIS — E1165 Type 2 diabetes mellitus with hyperglycemia: Secondary | ICD-10-CM | POA: Diagnosis present

## 2024-04-23 DIAGNOSIS — E782 Mixed hyperlipidemia: Secondary | ICD-10-CM | POA: Diagnosis not present

## 2024-04-23 DIAGNOSIS — Z8249 Family history of ischemic heart disease and other diseases of the circulatory system: Secondary | ICD-10-CM | POA: Diagnosis not present

## 2024-04-23 DIAGNOSIS — Z961 Presence of intraocular lens: Secondary | ICD-10-CM | POA: Diagnosis present

## 2024-04-23 DIAGNOSIS — I5032 Chronic diastolic (congestive) heart failure: Secondary | ICD-10-CM | POA: Diagnosis present

## 2024-04-23 DIAGNOSIS — Z923 Personal history of irradiation: Secondary | ICD-10-CM | POA: Diagnosis not present

## 2024-04-23 DIAGNOSIS — E876 Hypokalemia: Secondary | ICD-10-CM | POA: Diagnosis not present

## 2024-04-23 DIAGNOSIS — E1143 Type 2 diabetes mellitus with diabetic autonomic (poly)neuropathy: Secondary | ICD-10-CM | POA: Diagnosis present

## 2024-04-23 DIAGNOSIS — R5382 Chronic fatigue, unspecified: Secondary | ICD-10-CM | POA: Diagnosis present

## 2024-04-23 DIAGNOSIS — I2510 Atherosclerotic heart disease of native coronary artery without angina pectoris: Secondary | ICD-10-CM | POA: Diagnosis present

## 2024-04-23 LAB — CBC
HCT: 38.2 % (ref 36.0–46.0)
Hemoglobin: 11.6 g/dL — ABNORMAL LOW (ref 12.0–15.0)
MCH: 26.4 pg (ref 26.0–34.0)
MCHC: 30.4 g/dL (ref 30.0–36.0)
MCV: 86.8 fL (ref 80.0–100.0)
Platelets: 193 K/uL (ref 150–400)
RBC: 4.4 MIL/uL (ref 3.87–5.11)
RDW: 15 % (ref 11.5–15.5)
WBC: 9 K/uL (ref 4.0–10.5)
nRBC: 0.2 % (ref 0.0–0.2)

## 2024-04-23 LAB — TSH: TSH: 0.973 u[IU]/mL (ref 0.350–4.500)

## 2024-04-23 LAB — GLUCOSE, CAPILLARY
Glucose-Capillary: 235 mg/dL — ABNORMAL HIGH (ref 70–99)
Glucose-Capillary: 138 mg/dL — ABNORMAL HIGH (ref 70–99)

## 2024-04-23 LAB — CBG MONITORING, ED
Glucose-Capillary: 223 mg/dL — ABNORMAL HIGH (ref 70–99)
Glucose-Capillary: 172 mg/dL — ABNORMAL HIGH (ref 70–99)
Glucose-Capillary: 162 mg/dL — ABNORMAL HIGH (ref 70–99)

## 2024-04-23 MED ORDER — SODIUM CHLORIDE 0.9 % IV SOLN: 100.0000 mg | Freq: Every day | INTRAVENOUS | Status: DC

## 2024-04-23 MED ORDER — FENTANYL CITRATE (PF) 100 MCG/2ML IJ SOLN: 12.5000 ug | INTRAMUSCULAR | Status: DC | PRN

## 2024-04-23 MED ORDER — INSULIN ASPART 100 UNIT/ML IJ SOLN: 0.0000 [IU] | Freq: Every day | INTRAMUSCULAR | Status: DC

## 2024-04-23 MED ORDER — ACETAMINOPHEN 650 MG RE SUPP: 650.0000 mg | Freq: Four times a day (QID) | RECTAL | Status: DC | PRN

## 2024-04-23 MED ORDER — TRIMETHOBENZAMIDE HCL 100 MG/ML IM SOLN: 200.0000 mg | Freq: Four times a day (QID) | INTRAMUSCULAR | Status: DC | PRN

## 2024-04-23 MED ORDER — TRAZODONE HCL 50 MG PO TABS
25.0000 mg | ORAL_TABLET | Freq: Every evening | ORAL | Status: DC | PRN
  Administered 2024-04-23: 25 mg via ORAL
  Filled 2024-04-23 (×2): qty 1

## 2024-04-23 MED ORDER — OXYCODONE HCL 5 MG PO TABS: 5.0000 mg | ORAL_TABLET | ORAL | Status: DC | PRN

## 2024-04-23 MED ORDER — ACETAMINOPHEN 325 MG PO TABS
650.0000 mg | ORAL_TABLET | Freq: Four times a day (QID) | ORAL | Status: DC | PRN
  Administered 2024-04-23 – 2024-04-25 (×3): 650 mg via ORAL
  Filled 2024-04-23 (×4): qty 2

## 2024-04-23 MED ORDER — FOSFOMYCIN TROMETHAMINE 3 G PO PACK
3.0000 g | PACK | Freq: Once | ORAL | Status: AC
  Administered 2024-04-23: 3 g via ORAL
  Filled 2024-04-23: qty 3

## 2024-04-23 MED ORDER — SODIUM CHLORIDE 0.9 % IV SOLN
100.0000 mg | INTRAVENOUS | Status: AC
  Administered 2024-04-23 (×2): 100 mg via INTRAVENOUS
  Filled 2024-04-23 (×2): qty 20

## 2024-04-23 MED ORDER — ONDANSETRON HCL 4 MG PO TABS: 4.0000 mg | ORAL_TABLET | Freq: Four times a day (QID) | ORAL | Status: DC | PRN

## 2024-04-23 MED ORDER — METOPROLOL TARTRATE 25 MG PO TABS
12.5000 mg | ORAL_TABLET | Freq: Two times a day (BID) | ORAL | Status: DC
  Administered 2024-04-23 – 2024-04-24 (×3): 12.5 mg via ORAL
  Filled 2024-04-23 (×3): qty 1

## 2024-04-23 MED ORDER — GUAIFENESIN 100 MG/5ML PO LIQD
5.0000 mL | ORAL | Status: DC | PRN
  Administered 2024-04-23 – 2024-04-24 (×3): 5 mL via ORAL
  Filled 2024-04-23 (×3): qty 5

## 2024-04-23 MED ORDER — IPRATROPIUM-ALBUTEROL 0.5-2.5 (3) MG/3ML IN SOLN
3.0000 mL | Freq: Four times a day (QID) | RESPIRATORY_TRACT | Status: DC | PRN
  Administered 2024-04-23: 3 mL via RESPIRATORY_TRACT
  Filled 2024-04-23: qty 3

## 2024-04-23 MED ORDER — BISACODYL 5 MG PO TBEC
5.0000 mg | DELAYED_RELEASE_TABLET | Freq: Every day | ORAL | Status: DC | PRN
Start: 2024-04-23 — End: 2024-04-26

## 2024-04-23 MED ORDER — SODIUM CHLORIDE 0.9 % IV SOLN
100.0000 mg | Freq: Every day | INTRAVENOUS | Status: AC
  Administered 2024-04-24 – 2024-04-25 (×2): 100 mg via INTRAVENOUS
  Filled 2024-04-23: qty 100
  Filled 2024-04-23: qty 20

## 2024-04-23 MED ORDER — LACTATED RINGERS IV SOLN: INTRAVENOUS | Status: AC

## 2024-04-23 MED ORDER — INSULIN GLARGINE-YFGN 100 UNIT/ML ~~LOC~~ SOLN
15.0000 [IU] | Freq: Every day | SUBCUTANEOUS | Status: DC
  Administered 2024-04-23 – 2024-04-25 (×3): 15 [IU] via SUBCUTANEOUS
  Filled 2024-04-23 (×5): qty 0.15

## 2024-04-23 MED ORDER — INSULIN ASPART 100 UNIT/ML IJ SOLN
0.0000 [IU] | Freq: Three times a day (TID) | INTRAMUSCULAR | Status: DC
  Administered 2024-04-23: 3 [IU] via SUBCUTANEOUS
  Administered 2024-04-23 (×2): 2 [IU] via SUBCUTANEOUS
  Administered 2024-04-24: 7 [IU] via SUBCUTANEOUS
  Administered 2024-04-24 – 2024-04-25 (×4): 2 [IU] via SUBCUTANEOUS
  Administered 2024-04-26: 3 [IU] via SUBCUTANEOUS
  Filled 2024-04-23 (×2): qty 1

## 2024-04-23 MED ORDER — ENOXAPARIN SODIUM 40 MG/0.4ML IJ SOSY
40.0000 mg | PREFILLED_SYRINGE | INTRAMUSCULAR | Status: DC
  Administered 2024-04-23 – 2024-04-26 (×4): 40 mg via SUBCUTANEOUS
  Filled 2024-04-23 (×4): qty 0.4

## 2024-04-23 MED ORDER — SODIUM CHLORIDE 0.9 % IV SOLN: 200.0000 mg | Freq: Once | INTRAVENOUS | Status: DC

## 2024-04-23 MED ORDER — FENTANYL CITRATE PF 50 MCG/ML IJ SOSY: 12.5000 ug | PREFILLED_SYRINGE | INTRAMUSCULAR | Status: DC | PRN

## 2024-04-23 MED ORDER — IPRATROPIUM-ALBUTEROL 0.5-2.5 (3) MG/3ML IN SOLN: 3.0000 mL | RESPIRATORY_TRACT | Status: DC | PRN

## 2024-04-23 MED ORDER — ACETAMINOPHEN 325 MG PO TABS
650.0000 mg | ORAL_TABLET | Freq: Four times a day (QID) | ORAL | Status: DC | PRN
  Administered 2024-04-25: 650 mg via ORAL

## 2024-04-23 MED ORDER — INSULIN ASPART 100 UNIT/ML IJ SOLN
5.0000 [IU] | Freq: Three times a day (TID) | INTRAMUSCULAR | Status: DC
  Administered 2024-04-23 – 2024-04-26 (×6): 5 [IU] via SUBCUTANEOUS
  Filled 2024-04-23 (×2): qty 1

## 2024-04-23 MED ORDER — ONDANSETRON HCL 4 MG/2ML IJ SOLN
4.0000 mg | Freq: Four times a day (QID) | INTRAMUSCULAR | Status: DC | PRN
Start: 2024-04-23 — End: 2024-04-26

## 2024-04-23 NOTE — Evaluation (Signed)
 Physical Therapy Evaluation Patient Details Name: KHRISTY KALAN MRN: [id number] DOB: Dec 18, 1945 Today's Date: 04/23/2024  History of Present Illness  [name] is a 78 year old female with type 2 diabetes mellitus, history of breast cancer, valvular heart disease and heart failure, GERD, hypertension presented to the emergency department complaining of generalized weakness, headaches and exertional dyspnea.  Patient had presented to an urgent care earlier in the day and diagnosed with a positive COVID test.  Patient says she has been feeling bad for the past 3 to 4 days.  Her symptoms have worsened over time.  She has had fever at home and some diarrhea and sore throat.  She denies chest pain abdominal pain nausea and emesis.  Patient says she has had at least 3 COVID vaccines.  Patient initially did not require supplemental oxygen however when ambulating her pulse ox dropped to 88% and she complained of shortness of breath when walking and she was subsequently placed on nasal cannula.  She reportedly had to stop to catch her breath a couple of times when ambulating and heart rate went up to 120 when ambulating.  She said that she did not feel good and admission was requested for further management.   Clinical Impression  Patient demonstrates slow labored movement for sitting up at bedside, very unsteady on feet and limited to a few slow labored side steps, steps forward/backwards before having to sit due to poor standing balance, BLE weakness and difficulty breathing.  Patient on room air with SpO2 maintaining at 98-100% during functional activities - nurse notified. Patient tolerated sitting up in chair after therapy with her family member present. Patient will benefit from continued skilled physical therapy in hospital and recommended venue below to increase strength, balance, endurance for safe ADLs and gait.           If plan is discharge home, recommend the following: A lot of help with  bathing/dressing/bathroom;A lot of help with walking and/or transfers;Help with stairs or ramp for entrance;Assistance with cooking/housework   Can travel by private vehicle   Yes    Equipment Recommendations None recommended by PT  Recommendations for Other Services       Functional Status Assessment Patient has had a recent decline in their functional status and demonstrates the ability to make significant improvements in function in a reasonable and predictable amount of time.     Precautions / Restrictions Precautions Precautions: Fall Recall of Precautions/Restrictions: Intact Restrictions Weight Bearing Restrictions Per Provider Order: No      Mobility  Bed Mobility Overal bed mobility: Needs Assistance Bed Mobility: Supine to Sit     Supine to sit: Supervision, Contact guard     General bed mobility comments: slow labored movement    Transfers Overall transfer level: Needs assistance Equipment used: Rolling walker (2 wheels), 1 person hand held assist Transfers: Sit to/from Stand, Bed to chair/wheelchair/BSC Sit to Stand: Mod assist   Step pivot transfers: Mod assist       General transfer comment: unsteady labored movement with difficulty completing sit to stands due to BLE weakness    Ambulation/Gait Ambulation/Gait assistance: Mod assist Gait Distance (Feet): 6 Feet Assistive device: Rolling walker (2 wheels) Gait Pattern/deviations: Decreased step length - left, Decreased stance time - right, Decreased stride length, Knees buckling, Trunk flexed Gait velocity: slow     General Gait Details: limited to a few slow labored steps forward/backwards before having to sit due weakness/buckling of knees  Stairs  Wheelchair Mobility     Tilt Bed    Modified Rankin (Stroke Patients Only)       Balance Overall balance assessment: Needs assistance Sitting-balance support: Feet supported, No upper extremity supported Sitting  balance-Leahy Scale: Fair Sitting balance - Comments: fair/good seated at EOB   Standing balance support: During functional activity, No upper extremity supported Standing balance-Leahy Scale: Poor Standing balance comment: fair/poor using RW                             Pertinent Vitals/Pain Pain Assessment Pain Assessment: Faces Faces Pain Scale: Hurts a little bit Pain Location: right side/flank Pain Descriptors / Indicators: Discomfort Pain Intervention(s): Monitored during session    Home Living Family/patient expects to be discharged to:: Private residence Living Arrangements: Spouse/significant other Available Help at Discharge: Family;Available 24 hours/day Type of Home: House Home Access: Stairs to enter;Level entry     Alternate Level Stairs-Number of Steps: 22 Home Layout: Two level;Able to live on main level with bedroom/bathroom;Full bath on main level Home Equipment: Rolling Walker (2 wheels);Cane - single point      Prior Function Prior Level of Function : Independent/Modified Independent;Driving             Mobility Comments: Community ambulation using SPC PRN, drives, shops ADLs Comments: Independent     Extremity/Trunk Assessment   Upper Extremity Assessment Upper Extremity Assessment: Generalized weakness    Lower Extremity Assessment Lower Extremity Assessment: Generalized weakness    Cervical / Trunk Assessment Cervical / Trunk Assessment: Normal  Communication   Communication Communication: No apparent difficulties    Cognition Arousal: Alert Behavior During Therapy: WFL for tasks assessed/performed   PT - Cognitive impairments: No apparent impairments                         Following commands: Intact       Cueing Cueing Techniques: Verbal cues, Tactile cues     General Comments      Exercises     Assessment/Plan    PT Assessment Patient needs continued PT services  PT Problem List Decreased  strength;Decreased activity tolerance;Decreased balance;Decreased mobility;Cardiopulmonary status limiting activity       PT Treatment Interventions DME instruction;Gait training;Stair training;Functional mobility training;Therapeutic activities;Therapeutic exercise;Balance training;Patient/family education    PT Goals (Current goals can be found in the Care Plan section)  Acute Rehab PT Goals Patient Stated Goal: return home after rehab PT Goal Formulation: With patient/family Time For Goal Achievement: 05/07/24 Potential to Achieve Goals: Good    Frequency Min 3X/week     Co-evaluation               AM-PAC PT 6 Clicks Mobility  Outcome Measure Help needed turning from your back to your side while in a flat bed without using bedrails?: None Help needed moving from lying on your back to sitting on the side of a flat bed without using bedrails?: A Little Help needed moving to and from a bed to a chair (including a wheelchair)?: A Lot Help needed standing up from a chair using your arms (e.g., wheelchair or bedside chair)?: A Lot Help needed to walk in hospital room?: A Lot Help needed climbing 3-5 steps with a railing? : A Lot 6 Click Score: 15    End of Session   Activity Tolerance: Patient tolerated treatment well;Patient limited by fatigue Patient left: in chair;with call bell/phone within reach;with  family/visitor present Nurse Communication: Mobility status PT Visit Diagnosis: Unsteadiness on feet (R26.81);Other abnormalities of gait and mobility (R26.89);Muscle weakness (generalized) (M62.81)    Time: 8552-8481 PT Time Calculation (min) (ACUTE ONLY): 31 min   Charges:   PT Evaluation $PT Eval Moderate Complexity: 1 Mod PT Treatments $Therapeutic Activity: 23-37 mins PT General Charges $$ ACUTE PT VISIT: 1 Visit         3:39 PM, 04/23/24 Lynwood Music, MPT Physical Therapist with [facility] [phone] office [phone] mobile  phone

## 2024-04-23 NOTE — ED Notes (Signed)
 Patient was on 1L stating 98% attempted patient on room air. Patient desat to 84%, put her back on 1L oxygen Patient sat come up to 94%. MD made aware.

## 2024-04-23 NOTE — ED Notes (Signed)
 Pt placed on O2 @2L  nasal cannula for sats of 88%.

## 2024-04-23 NOTE — H&P (Signed)
 History and Physical  [facility]  [name] FMW:[id number] DOB: 15-Feb-1946 DOA: 04/22/2024  PCP: [name], MD  Patient coming from: Home  Level of care: Telemetry  I have personally briefly reviewed patient's old medical records in [facility]  Chief Complaint: weakness, SOB  HPI: [name] is a 78 year old female with type 2 diabetes mellitus, history of breast cancer, valvular heart disease and heart failure, GERD, hypertension presented to the emergency department complaining of generalized weakness, headaches and exertional dyspnea.  Patient had presented to an urgent care earlier in the day and diagnosed with a positive COVID test.  Patient says she has been feeling bad for the past 3 to 4 days.  Her symptoms have worsened over time.  She has had fever at home and some diarrhea and sore throat.  She denies chest pain abdominal pain nausea and emesis.  Patient says she has had at least 3 COVID vaccines.  Patient initially did not require supplemental oxygen however when ambulating her pulse ox dropped to 88% and she complained of shortness of breath when walking and she was subsequently placed on nasal cannula.  She reportedly had to stop to catch her breath a couple of times when ambulating and heart rate went up to 120 when ambulating.  She said that she did not feel good and admission was requested for further management.   Past Medical History:  Diagnosis Date   (HFpEF) heart failure with preserved ejection fraction (HCC)    Abdominal hernia 02/26/2012   unrepaired   Adenocarcinoma of breast (HCC) 1997   right / chemo + tamoxifen x 5 years    Anemia    Anxiety    Aortic atherosclerosis (HCC)    Arthritis    Blood transfusion 1980's   Breast cancer (HCC)    Cancer (HCC)    Phreesia 08/18/2020   Complication of anesthesia    has a hard time waking up; can't lay flat   Degenerative disc disease, lumbar    pressing on L3 and L4   Diabetes mellitus  (HCC) 1989   Type 2 NIDDM; cancer treatment gave me diabetes   Diabetes mellitus without complication (HCC)    Phreesia 08/18/2020   Diverticulosis    DJD (degenerative joint disease) of lumbar spine    Dysrhythmia    irregular   Exertional dyspnea    Gastroparesis    GERD (gastroesophageal reflux disease)    mann   Heart murmur    think I outgrew it   Hepatic steatosis    History of kidney stones    History of stomach ulcers    Hx: UTI (urinary tract infection)    Hyperlipidemia    Hypersensitivity    in tongue   Hypertension    IBS (irritable bowel syndrome)    Insomnia    Internal hemorrhoids    Kidney cysts    RT KIDNEY   Kidney stones 2009   s/p lithotripsy   Mild CAD    Mild valvular heart disease    Nephrolithiasis 04/13/2014   Obesity    Personal history of chemotherapy    Personal history of radiation therapy    PFO (patent foramen ovale)    PONV (postoperative nausea and vomiting)    Pulmonary nodule    Shortness of breath    unable to lie flat   Ventral hernia     Past Surgical History:  Procedure Laterality Date   ABDOMINAL HYSTERECTOMY  2002  APPENDECTOMY     BACK SURGERY     Can't take any medical procedure in right arm   BREAST BIOPSY     right   BREAST SURGERY N/A    Phreesia 04/22/2020   CATARACT EXTRACTION W/ INTRAOCULAR LENS  IMPLANT, BILATERAL  2009-2010   CESAREAN SECTION  1973; 1978; 1981   CESAREAN SECTION N/A    Phreesia 04/22/2020   COLONOSCOPY WITH PROPOFOL  N/A 10/29/2016   Procedure: COLONOSCOPY WITH PROPOFOL ;  Surgeon: Margo LITTIE Haddock, MD;  [facility];  Service: Endoscopy;  Laterality: N/A;  10:00 am   CYSTOSCOPY Left 04/13/2014   Procedure: CYSTOSCOPY FLEXIBLE;  Surgeon: Morene LELON Salines, MD;  Location: [facility];  Service: Urology;  Laterality: Left;  with STENT   ESOPHAGOGASTRODUODENOSCOPY (EGD) WITH PROPOFOL  N/A 10/29/2016   Procedure: ESOPHAGOGASTRODUODENOSCOPY (EGD) WITH PROPOFOL ;  Surgeon: Margo LITTIE Haddock,  MD;  [facility];  Service: Endoscopy;  Laterality: N/A;   EYE SURGERY     implant and screws put in the right lower jaw  11/2008   dental surgery   kidney blockage  ?1990's    major surgery ;put kidney on pump for awhile   LITHOTRIPSY     several times   LUMBAR FUSION  08/2010   MASTECTOMY  1997   right   NEPHROLITHOTOMY Left 04/13/2014   Procedure: LEFT PERCUTANEOUS NEPHROLITHOTOMY WITH SURGEON ACCESS;  Surgeon: Morene LELON Salines, MD;  Location: [facility];  Service: Urology;  Laterality: Left;   OVARIAN CYST SURGERY     PARATHYROIDECTOMY  02/26/2012   Procedure: PARATHYROIDECTOMY;  Surgeon: Ana LELON Moccasin, MD;  Location[facility];  Service: ENT;;   SAVORY DILATION N/A 10/29/2016   Procedure: SAVORY DILATION;  Surgeon: Margo LITTIE Haddock, MD;  [facility];  Service: Endoscopy;  Laterality: N/A;   SPINE SURGERY  2011   urological surgery for blocked ureter secondary to kidney stone       reports that she has never smoked. She has never used smokeless tobacco. She reports current alcohol  use. She reports that she does not use drugs.  Allergies  Allergen Reactions   Demerol [Meperidine] Other (See Comments)    Lost consciousness   Bentyl  [Dicyclomine  Hcl] Swelling   Invokana [Canagliflozin] Hives   Metformin  And Related Diarrhea   Protonix  [Pantoprazole  Sodium] Swelling   Statins Other (See Comments)    myalgia   Ace Inhibitors Cough    Family History  Problem Relation Age of Onset   Birth defects Mother    Heart attack Mother    Cancer Brother        Esophageal; smoker; deceased 75s   Colon cancer Brother    Diabetes Daughter    Leukemia Paternal Aunt        deceased 68s   Pancreatic cancer Paternal Aunt        deceased 74   Cancer Paternal Aunt        GI cancer; deceased 64s   Cancer Cousin        kidney cancer; deceased 35; pat first cousin; daughter of aunt w/ leukemia   Cancer Cousin        colon cancer @ 60; pat first cousin; son of aunt with GI  cancer   Anesthesia problems Neg Hx     Prior to Admission medications   Medication Sig Start Date End Date Taking? Authorizing Provider  acetaminophen  (TYLENOL ) 325 MG tablet Take 325 mg by mouth every 6 (six) hours as needed  for mild pain.    [provider]  amLODipine  (NORVASC ) 5 MG tablet Take 1 tablet (5 mg total) by mouth daily. 11/06/23   [name], MD  blood glucose meter kit and supplies KIT Dispense based on patient and insurance preference. Use up to four times daily as directed. 06/17/23   [name], MD  Blood Glucose Monitoring Suppl (BLOOD GLUCOSE SYSTEM PAK) KIT Use as directed to monitor FSBS 1x daily. Dx: E11.9 01/16/18   Bari Theodoro FALCON, MD  Cholecalciferol (VITAMIN D ) 50 MCG (2000 UT) CAPS Take 1 capsule (2,000 Units total) by mouth daily. 01/05/24   Lamon Pleasant HERO, PA-C  CINNAMON PO Take 1 capsule by mouth. Takes sometimes    [provider]  Continuous Glucose Sensor (FREESTYLE LIBRE 3 PLUS SENSOR) MISC Change sensor every 15 days. 02/06/24   [name], MD  docusate sodium  (COLACE) 50 MG capsule Take 1 capsule (50 mg total) by mouth daily as needed for mild constipation. 01/05/24   Lamon Pleasant HERO, PA-C  DULoxetine  (CYMBALTA ) 60 MG capsule Take 1 capsule (60 mg total) by mouth daily. 12/17/23   Gregg Lek, MD  ferrous sulfate  325 (65 FE) MG EC tablet Take 1 tablet (325 mg total) by mouth every other day. 01/05/24   Lamon Pleasant HERO, PA-C  furosemide  (LASIX ) 40 MG tablet Take 1 tablet (40 mg total) by mouth daily as needed (FOR SWELLING). 07/29/22   Dunn, Dayna N, PA-C  glipiZIDE  (GLUCOTROL ) 10 MG tablet Take 1 tablet (10 mg total) by mouth 2 (two) times daily before a meal. 05/02/23   Tobie, Suzzane POUR, MD  glucose blood (ACCU-CHEK GUIDE) test strip 1 each by Other route as needed for other. Use as instructed to check blood sugar daily. 03/09/18   Von Pacific, MD  insulin  glargine (LANTUS  SOLOSTAR) 100 UNIT/ML Solostar Pen Inject  20 Units into the skin 2 (two) times daily. 02/06/24   [name], MD  Insulin  Pen Needle 32G X 4 MM MISC Use as directed for insulin  administration. 06/17/23   [name], MD  LANCETS SUPER THIN 28G MISC Use to check blood sugar once daily. 03/09/18   Von Pacific, MD  metFORMIN  (GLUCOPHAGE -XR) 500 MG 24 hr tablet TAKE ONE TABLET BY MOUTH ONCE DAILY WITH BREAKFAST. 03/04/24   [name], MD  metoprolol  tartrate (LOPRESSOR ) 25 MG tablet Take 1 tablet (25 mg total) by mouth 2 (two) times daily. 02/06/24   [name], MD  Multiple Vitamin (MULTIVITAMIN) tablet Take 1 tablet by mouth daily.    [provider]  potassium chloride  (KLOR-CON ) 10 MEQ tablet Take 1 tablet (10 mEq total) by mouth daily. 02/12/22   Paseda, Folashade R, FNP    Physical Exam: Vitals:   04/23/24 0325 04/23/24 0345 04/23/24 0430 04/23/24 0515  BP:  120/75 128/74 116/76  Pulse:  (!) 101 (!) 108 99  Resp:  (!) 22 (!) 23 20  Temp:      TempSrc:      SpO2: 98% 95% 92% 100%  Weight:      Height:       Constitutional: elderly frail female, appears calm, uncomfortable Eyes: PERRL, lids and conjunctivae normal ENMT: Mucous membranes are dry. Posterior pharynx clear of any exudate or lesions.  Neck: normal, supple, no masses, no thyromegaly Respiratory: clear to auscultation bilaterally, no wheezing, no crackles. Normal respiratory effort. No accessory muscle use.  Cardiovascular: normal s1, s2 sounds, no murmurs / rubs /  gallops. No extremity edema. 2+ pedal pulses. No carotid bruits.  Abdomen: no tenderness, no masses palpated. No hepatosplenomegaly. Bowel sounds positive.  Musculoskeletal: no clubbing / cyanosis. No joint deformity upper and lower extremities. Good ROM, no contractures. Normal muscle tone.  Skin: no rashes, lesions, ulcers. No induration Neurologic: CN 2-12 grossly intact. Sensation intact, DTR normal. Strength 5/5 in all 4.  Psychiatric: Normal judgment and insight. Alert and  oriented x 3. Normal mood.   Labs on Admission: I have personally reviewed following labs and imaging studies  CBC: Recent Labs  Lab 04/22/24 1835  WBC 9.9  NEUTROABS 5.8  HGB 11.3*  HCT 34.5*  MCV 81.9  PLT 225   Basic Metabolic Panel: Recent Labs  Lab 04/22/24 1835  NA 137  K 3.7  CL 101  CO2 24  GLUCOSE 217*  BUN 15  CREATININE 1.53*  CALCIUM  8.4*   GFR: Estimated Creatinine Clearance: 32.3 mL/min (A) (by C-G formula based on SCr of 1.53 mg/dL (H)). Liver Function Tests: Recent Labs  Lab 04/22/24 1835  AST 25  ALT 19  ALKPHOS 62  BILITOT 1.2  PROT 7.1  ALBUMIN 3.3*   No results for input(s): LIPASE, AMYLASE in the last 168 hours. No results for input(s): AMMONIA in the last 168 hours. Coagulation Profile: No results for input(s): INR, PROTIME in the last 168 hours. Cardiac Enzymes: No results for input(s): CKTOTAL, CKMB, CKMBINDEX, TROPONINI in the last 168 hours. BNP (last 3 results) No results for input(s): PROBNP in the last 8760 hours. HbA1C: No results for input(s): HGBA1C in the last 72 hours. CBG: Recent Labs  Lab 04/23/24 0244  GLUCAP 223*   Lipid Profile: No results for input(s): CHOL, HDL, LDLCALC, TRIG, CHOLHDL, LDLDIRECT in the last 72 hours. Thyroid  Function Tests: No results for input(s): TSH, T4TOTAL, FREET4, T3FREE, THYROIDAB in the last 72 hours. Anemia Panel: No results for input(s): VITAMINB12, FOLATE, FERRITIN, TIBC, IRON, RETICCTPCT in the last 72 hours. Urine analysis:    Component Value Date/Time   COLORURINE AMBER (A) 04/22/2024 2000   APPEARANCEUR CLOUDY (A) 04/22/2024 2000   LABSPEC 1.019 04/22/2024 2000   PHURINE 5.0 04/22/2024 2000   GLUCOSEU 150 (A) 04/22/2024 2000   HGBUR NEGATIVE 04/22/2024 2000   HGBUR negative 07/27/2009 1553   BILIRUBINUR NEGATIVE 04/22/2024 2000   BILIRUBINUR neg 12/28/2013 1514   KETONESUR NEGATIVE 04/22/2024 2000   PROTEINUR 100  (A) 04/22/2024 2000   UROBILINOGEN 0.2 12/28/2013 1514   UROBILINOGEN negative 07/27/2009 1553   NITRITE NEGATIVE 04/22/2024 2000   LEUKOCYTESUR NEGATIVE 04/22/2024 2000    Radiological Exams on Admission: DG Chest Portable 1 View Result Date: 04/22/2024 CLINICAL DATA:  Cough, COVID positive EXAM: PORTABLE CHEST 1 VIEW COMPARISON:  10/13/2017 FINDINGS: Heart and mediastinal contours within normal limits. Right lung clear. Linear densities at the left base likely reflect atelectasis. No effusions or pneumothorax. No acute bony abnormality. IMPRESSION: Left base atelectasis. Electronically Signed   By: Franky Crease M.D.   On: 04/22/2024 18:21   Assessment/Plan Principal Problem:   COVID-19 virus infection Active Problems:   Hyperlipidemia   Essential hypertension   GERD (gastroesophageal reflux disease)   IBS (irritable bowel syndrome)   Uncontrolled type 2 diabetes mellitus with hyperglycemia, without long-term current use of insulin  (HCC)   Esophageal dysphagia   Diabetic neuropathy (HCC)   Chronic fatigue   Peripheral neuropathy   Generalized weakness   AKI (acute kidney injury) (HCC)   Covid Infection  -- Patient presented with  symptomatic infection, now with supplemental oxygen requirement -- Remdesivir IV ordered per protocol -- Continue supportive measures as ordered -- Incentive spirometry -- Wean to room air as able -- Fortunately patient has had COVID vaccination  AKI -- Prerenal given dehydration and poor oral intake -- Treatment with IV fluid hydration ordered -- Monitor BMP  Uncontrolled type 2 diabetes mellitus with hyperglycemia -- Follow-up hemoglobin A1c -- For now resume basal insulin  with prandial coverage, SSI and CBG monitor  Generalized weakness -- Multifactorial given COVID infection, dehydration and AKI -- Treating supportively -- Requesting PT evaluation  Essential hypertension -- Blood pressures are soft now in setting of dehydration and  weakness -- Reduced home metoprolol  from 25 mg to 12.5 mg twice daily with hold parameters  DVT prophylaxis: enoxaparin   Code Status: Full   Family Communication: daughter at bedside   Disposition Plan: TBD   Consults called:   Admission status: OBV  Time spent: 50 mins   Level of care: Telemetry Afton Louder MD [facility] How to contact the [facility] Attending or Consulting provider 7A - 7P or covering provider during after hours 7P -7A, for this patient?  Check the care team in [facility] and look for a) attending/consulting TR[facility] provider listed and b) the TR[facility] team listed Log into [email or web address] and use [place]'s universal password to access. If you do not have the password, please contact the hospital operator. Locate the [facility] provider you are looking for under [facility] and page to a number that you can be directly reached. If you still have difficulty reaching the provider, please page the [facility] (Director on Call) for the Hospitalists listed on amion for assistance.   If 7PM-7AM, please contact night-coverage [email or web address] Password [facility]  04/23/2024, 7:47 AM

## 2024-04-23 NOTE — ED Notes (Signed)
 Pt rang out stating, I just don't feel good. My head hurts. SOB noticed no wheezing heard at this time. Respirations are elevated. Opyd made aware.

## 2024-04-23 NOTE — Plan of Care (Signed)
  Problem: Acute Rehab PT Goals(only PT should resolve) Goal: Pt Will Go Supine/Side To Sit Outcome: Progressing Flowsheets (Taken 04/23/2024 1540) Pt will go Supine/Side to Sit:  with modified independence  with supervision Goal: Patient Will Transfer Sit To/From Stand Outcome: Progressing Flowsheets (Taken 04/23/2024 1540) Patient will transfer sit to/from stand:  with contact guard assist  with minimal assist Goal: Pt Will Transfer Bed To Chair/Chair To Bed Outcome: Progressing Flowsheets (Taken 04/23/2024 1540) Pt will Transfer Bed to Chair/Chair to Bed:  with contact guard assist  with min assist Goal: Pt Will Ambulate Outcome: Progressing Flowsheets (Taken 04/23/2024 1540) Pt will Ambulate:  50 feet  with contact guard assist  with minimal assist  with rolling walker  3:41 PM, 04/23/24 Lynwood Music, MPT Physical Therapist with [facility] [phone] office [phone] mobile phone

## 2024-04-23 NOTE — Hospital Course (Signed)
 78 year old female with type 2 diabetes mellitus, history of breast cancer, valvular heart disease and heart failure, GERD, hypertension presented to the emergency department complaining of generalized weakness, headaches and exertional dyspnea.  Patient had presented to an urgent care earlier in the day and diagnosed with a positive COVID test.  Patient says she has been feeling bad for the past 3 to 4 days.  Her symptoms have worsened over time.  She has had fever at home and some diarrhea and sore throat.  She denies chest pain abdominal pain nausea and emesis.  Patient says she has had at least 3 COVID vaccines.  Patient initially did not require supplemental oxygen however when ambulating her pulse ox dropped to 88% and she complained of shortness of breath when walking and she was subsequently placed on nasal cannula.  She reportedly had to stop to catch her breath a couple of times when ambulating and heart rate went up to 120 when ambulating.  She said that she did not feel good and admission was requested for further management.

## 2024-04-24 DIAGNOSIS — U071 COVID-19: Secondary | ICD-10-CM | POA: Diagnosis not present

## 2024-04-24 DIAGNOSIS — K219 Gastro-esophageal reflux disease without esophagitis: Secondary | ICD-10-CM | POA: Diagnosis not present

## 2024-04-24 DIAGNOSIS — E782 Mixed hyperlipidemia: Secondary | ICD-10-CM

## 2024-04-24 DIAGNOSIS — N179 Acute kidney failure, unspecified: Secondary | ICD-10-CM | POA: Diagnosis not present

## 2024-04-24 LAB — CBC
HCT: 30.6 % — ABNORMAL LOW (ref 36.0–46.0)
Hemoglobin: 10 g/dL — ABNORMAL LOW (ref 12.0–15.0)
MCH: 26.8 pg (ref 26.0–34.0)
MCHC: 32.7 g/dL (ref 30.0–36.0)
MCV: 82 fL (ref 80.0–100.0)
Platelets: 199 K/uL (ref 150–400)
RBC: 3.73 MIL/uL — ABNORMAL LOW (ref 3.87–5.11)
RDW: 14.6 % (ref 11.5–15.5)
WBC: 8.1 K/uL (ref 4.0–10.5)
nRBC: 0 % (ref 0.0–0.2)

## 2024-04-24 LAB — GLUCOSE, CAPILLARY
Glucose-Capillary: 105 mg/dL — ABNORMAL HIGH (ref 70–99)
Glucose-Capillary: 155 mg/dL — ABNORMAL HIGH (ref 70–99)
Glucose-Capillary: 170 mg/dL — ABNORMAL HIGH (ref 70–99)
Glucose-Capillary: 323 mg/dL — ABNORMAL HIGH (ref 70–99)
Glucose-Capillary: 251 mg/dL — ABNORMAL HIGH (ref 70–99)

## 2024-04-24 LAB — HEMOGLOBIN A1C
Hgb A1c MFr Bld: 8.7 % — ABNORMAL HIGH (ref 4.8–5.6)
Mean Plasma Glucose: 203 mg/dL

## 2024-04-24 LAB — BASIC METABOLIC PANEL WITH GFR
Anion gap: 11 (ref 5–15)
BUN: 15 mg/dL (ref 8–23)
CO2: 22 mmol/L (ref 22–32)
Calcium: 8.1 mg/dL — ABNORMAL LOW (ref 8.9–10.3)
Chloride: 108 mmol/L (ref 98–111)
Creatinine, Ser: 1.13 mg/dL — ABNORMAL HIGH (ref 0.44–1.00)
GFR, Estimated: 50 mL/min — ABNORMAL LOW (ref >60)
Glucose, Bld: 162 mg/dL — ABNORMAL HIGH (ref 70–99)
Potassium: 3 mmol/L — ABNORMAL LOW (ref 3.5–5.1)
Sodium: 141 mmol/L (ref 135–145)

## 2024-04-24 LAB — MAGNESIUM: Magnesium: 1.2 mg/dL — ABNORMAL LOW (ref 1.7–2.4)

## 2024-04-24 MED ORDER — LACTATED RINGERS IV SOLN: INTRAVENOUS | Status: DC

## 2024-04-24 MED ORDER — LOPERAMIDE HCL 2 MG PO CAPS
2.0000 mg | ORAL_CAPSULE | ORAL | Status: DC | PRN
  Administered 2024-04-24: 2 mg via ORAL
  Filled 2024-04-24: qty 1

## 2024-04-24 MED ORDER — POTASSIUM CHLORIDE CRYS ER 20 MEQ PO TBCR
60.0000 meq | EXTENDED_RELEASE_TABLET | Freq: Once | ORAL | Status: AC
  Administered 2024-04-24: 60 meq via ORAL
  Filled 2024-04-24: qty 3

## 2024-04-24 MED ORDER — MAGNESIUM SULFATE 4 GM/100ML IV SOLN
4.0000 g | Freq: Once | INTRAVENOUS | Status: AC
  Administered 2024-04-24: 4 g via INTRAVENOUS
  Filled 2024-04-24: qty 100

## 2024-04-24 MED ORDER — METOPROLOL TARTRATE 25 MG PO TABS
25.0000 mg | ORAL_TABLET | Freq: Two times a day (BID) | ORAL | Status: DC
  Administered 2024-04-24 – 2024-04-26 (×4): 25 mg via ORAL
  Filled 2024-04-24 (×4): qty 1

## 2024-04-24 MED ORDER — GUAIFENESIN-CODEINE 100-10 MG/5ML PO SOLN
5.0000 mL | Freq: Four times a day (QID) | ORAL | Status: DC | PRN
  Administered 2024-04-24 – 2024-04-25 (×2): 5 mL via ORAL
  Filled 2024-04-24 (×2): qty 5

## 2024-04-24 NOTE — Plan of Care (Signed)
   Problem: Coping: Goal: Ability to adjust to condition or change in health will improve Outcome: Progressing

## 2024-04-24 NOTE — TOC Initial Note (Signed)
 Transition of Care [facility]) - Initial/Assessment Note    Patient Details  Name: [name] MRN: [id number] Date of Birth: 01-10-1946  Transition of Care [facility]) CM/SW Contact:    Noreen KATHEE Pinal, LCSWA Phone Number: 04/24/2024, 1:32 PM  Clinical Narrative:                  CSW spoke with patient family regarding PT recommendation for SNF. Spouse and Daughter declined at this time stating that they would like for patient to try [facility] and that patient was independent before this sickness. Spouse reports that their home is setup with all equipment / WC accessible. He reports they that have everything they need and that his daughter is a nurse who is also in the home with them. CSW reached out to [facility] with [facility] to see if he can accept. TOC following.    Expected Discharge Plan: Home w Home Health Services Barriers to Discharge: Continued Medical Work up   Patient Goals and CMS Choice Patient states their goals for this hospitalization and ongoing recovery are:: return home CMS Medicare.gov Compare Post Acute Care list provided to:: Patient Represenative (must comment) (Spouse and daughter Emmie) Choice offered to / list presented to : Spouse, Adult Children      Expected Discharge Plan and Services In-house Referral: Clinical Social Work   Post Acute Care Choice: Durable Medical Equipment Living arrangements for the past 2 months: Single Family Home                             HH Agency: [facility] Date [facility] Agency Contacted: 04/24/24 Time HH Agency Contacted: 1331 Representative spoke with at [facility] Agency: Darleene  Prior Living Arrangements/Services Living arrangements for the past 2 months: Single Family Home Lives with:: Adult Children, Spouse Patient language and need for interpreter reviewed:: Yes Do you feel safe going back to the place where you live?: Yes      Need for Family Participation in Patient Care: Yes (Comment) Care giver support system in place?:  Yes (comment) Current home services: DME Criminal Activity/Legal Involvement Pertinent to Current Situation/Hospitalization: No - Comment as needed  Activities of Daily Living   ADL Screening (condition at time of admission) Independently performs ADLs?: Yes (appropriate for developmental age) Is the patient deaf or have difficulty hearing?: No Does the patient have difficulty seeing, even when wearing glasses/contacts?: No Does the patient have difficulty concentrating, remembering, or making decisions?: No  Permission Sought/Granted      Share Information with NAME: Mr. Meetze and Emmie     Permission granted to share info w Relationship: Spouse and daughter     Emotional Assessment Appearance:: Appears stated age     Orientation: : Oriented to Self Alcohol  / Substance Use: Not Applicable Psych Involvement: No (comment)  Admission diagnosis:  COVID [U07.1] COVID-19 virus infection [U07.1] Patient Active Problem List   Diagnosis Date Noted   COVID-19 virus infection 04/23/2024   Generalized weakness 04/23/2024   AKI (acute kidney injury) (HCC) 04/23/2024   COVID 04/23/2024   Cyst of left parotid gland 12/01/2023   Neck mass 11/06/2023   Gait abnormality 12/19/2022   Peripheral neuropathy 09/17/2022   Right foot pain 05/02/2022   Hypokalemia 05/02/2022   Overactive bladder 02/08/2022   Left knee pain 02/08/2022   Right leg swelling 02/08/2022   Encounter for general adult medical examination with abnormal findings 08/30/2021   Chronic fatigue 08/30/2021  Vitamin B12 deficiency 08/30/2021   Statin myopathy 04/18/2021   Thyroid  nodule 01/16/2018   Overweight (BMI 25.0-29.9) 12/18/2017   Diabetic neuropathy (HCC) 10/13/2017   Genetic testing 11/28/2016   Esophageal dysphagia 09/30/2016   Ventral hernia 07/28/2015   Situational anxiety 04/26/2015   Thyromegaly 01/23/2015   Iron deficiency 06/17/2014   DDD (degenerative disc disease), lumbar 01/26/2014   Bulge  of lumbar disc without myelopathy 01/26/2014   Renal cyst 01/26/2014   Back pain with left-sided sciatica 12/28/2013   Uncontrolled type 2 diabetes mellitus with hyperglycemia, without long-term current use of insulin  (HCC) 08/01/2013   GASTROPARESIS 12/15/2008   GERD (gastroesophageal reflux disease) 06/21/2008   IBS (irritable bowel syndrome) 06/21/2008   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 (HCC) 02/09/2008   Hyperlipidemia 02/09/2008   Essential hypertension 02/09/2008   Insomnia 02/09/2008   PCP:  [name], MD Pharmacy:   [facility] - [place], [place] - [address] [place] [address] Phone: [phone] Fax: [phone]  [facility] [phone] - [place], [place] - [address] AT [facility] [address] [place] [address] Phone: [phone] Fax: [phone]     Social Drivers of Health (SDOH) Social History: SDOH Screenings   Food Insecurity: No Food Insecurity (04/23/2024)  Housing: Low Risk  (04/23/2024)  Transportation Needs: No Transportation Needs (04/23/2024)  Utilities: Not At Risk (04/23/2024)  Alcohol  Screen: Low Risk  (12/23/2023)  Depression (PHQ2-9): Low Risk  (02/06/2024)  Recent Concern: Depression (PHQ2-9) - Medium Risk (12/25/2023)  Financial Resource Strain: Low Risk  (12/23/2023)  Physical Activity: Unknown (12/23/2023)  Social Connections: Socially Integrated (04/23/2024)  Stress: Stress Concern Present (12/23/2023)  Tobacco Use: Low Risk  (04/23/2024)   SDOH Interventions:     Readmission Risk Interventions    04/24/2024    1:26 PM  Readmission Risk Prevention Plan  Transportation Screening Complete  Home Care Screening Complete  Medication Review (RN CM) Complete

## 2024-04-24 NOTE — Plan of Care (Signed)
  Problem: Education: Goal: Ability to describe self-care measures that may prevent or decrease complications (Diabetes Survival Skills Education) will improve Outcome: Progressing Goal: Individualized Educational Video(s) Outcome: Progressing   Problem: Coping: Goal: Ability to adjust to condition or change in health will improve Outcome: Progressing   Problem: Fluid Volume: Goal: Ability to maintain a balanced intake and output will improve Outcome: Progressing   Problem: Health Behavior/Discharge Planning: Goal: Ability to identify and utilize available resources and services will improve Outcome: Progressing Goal: Ability to manage health-related needs will improve Outcome: Progressing   Problem: Metabolic: Goal: Ability to maintain appropriate glucose levels will improve Outcome: Progressing   Problem: Nutritional: Goal: Maintenance of adequate nutrition will improve Outcome: Progressing Goal: Progress toward achieving an optimal weight will improve Outcome: Progressing   Problem: Skin Integrity: Goal: Risk for impaired skin integrity will decrease Outcome: Progressing   Problem: Tissue Perfusion: Goal: Adequacy of tissue perfusion will improve Outcome: Progressing   Problem: Education: Goal: Knowledge of risk factors and measures for prevention of condition will improve Outcome: Progressing   Problem: Coping: Goal: Psychosocial and spiritual needs will be supported Outcome: Progressing   Problem: Respiratory: Goal: Will maintain a patent airway Outcome: Progressing Goal: Complications related to the disease process, condition or treatment will be avoided or minimized Outcome: Progressing   Problem: Education: Goal: Knowledge of General Education information will improve Description: Including pain rating scale, medication(s)/side effects and non-pharmacologic comfort measures Outcome: Progressing   Problem: Health Behavior/Discharge Planning: Goal:  Ability to manage health-related needs will improve Outcome: Progressing   Problem: Clinical Measurements: Goal: Ability to maintain clinical measurements within normal limits will improve Outcome: Progressing Goal: Will remain free from infection Outcome: Progressing Goal: Diagnostic test results will improve Outcome: Progressing Goal: Respiratory complications will improve Outcome: Progressing Goal: Cardiovascular complication will be avoided Outcome: Progressing   Problem: Activity: Goal: Risk for activity intolerance will decrease Outcome: Progressing   Problem: Nutrition: Goal: Adequate nutrition will be maintained Outcome: Progressing   Problem: Coping: Goal: Level of anxiety will decrease Outcome: Progressing   Problem: Elimination: Goal: Will not experience complications related to bowel motility Outcome: Progressing Goal: Will not experience complications related to urinary retention Outcome: Progressing   Problem: Pain Managment: Goal: General experience of comfort will improve and/or be controlled Outcome: Progressing   Problem: Safety: Goal: Ability to remain free from injury will improve Outcome: Progressing   Problem: Skin Integrity: Goal: Risk for impaired skin integrity will decrease Outcome: Progressing

## 2024-04-24 NOTE — Progress Notes (Signed)
 PROGRESS NOTE   [name]  FMW:[id number] DOB: 28-Oct-1945 DOA: 04/22/2024 PCP: [name], MD   Chief Complaint  Patient presents with   Shortness of Breath   Level of care: Med-Surg  Brief Admission History:  78 year old female with type 2 diabetes mellitus, history of breast cancer, valvular heart disease and heart failure, GERD, hypertension presented to the emergency department complaining of generalized weakness, headaches and exertional dyspnea.  Patient had presented to an urgent care earlier in the day and diagnosed with a positive COVID test.  Patient says she has been feeling bad for the past 3 to 4 days.  Her symptoms have worsened over time.  She has had fever at home and some diarrhea and sore throat.  She denies chest pain abdominal pain nausea and emesis.  Patient says she has had at least 3 COVID vaccines.  Patient initially did not require supplemental oxygen however when ambulating her pulse ox dropped to 88% and she complained of shortness of breath when walking and she was subsequently placed on nasal cannula.  She reportedly had to stop to catch her breath a couple of times when ambulating and heart rate went up to 120 when ambulating.  She said that she did not feel good and admission was requested for further management.   Assessment and Plan:  Covid Infection  -- Patient presented with symptomatic infection, now with supplemental oxygen requirement -- Remdesivir  IV ordered per protocol x 3 day course  -- Continue supportive measures as ordered -- Incentive spirometry -- Wean to room air as able -- Fortunately patient has had COVID vaccination -- recheck portable CXR in AM and check procalcitonin   AKI - improving  -- Prerenal given dehydration and poor oral intake -- Treatment with IV fluid hydration ordered -- Monitor BMP   Uncontrolled type 2 diabetes mellitus with hyperglycemia -- Follow-up hemoglobin A1c 8.7%  -- For now resume basal insulin  with  prandial coverage, SSI and CBG monitor  CBG (last 3)  Recent Labs    04/24/24 0216 04/24/24 0756 04/24/24 1124  GLUCAP 105* 155* 170*   Generalized weakness -- Multifactorial given COVID infection, dehydration and AKI -- Treating supportively -- Requesting PT evaluation   Essential hypertension -- Blood pressures are soft now in setting of dehydration and weakness -- Resuming home metoprolol  from 25 mg twice daily with hold parameters  Hypokalemia -- replacement ordered  Hypomagnesemia -- severe -- replacement ordered -- recheck in AM   Diarrhea  -- suspect from viral illness -- imodium  and supportive care    DVT prophylaxis: enoxaparin  Code Status: Full  Family Communication: husband at bedside  Disposition: home   Consultants:   Procedures:   Antimicrobials:    Subjective: Pt reports had a rough night with coughing, chest congestion and diarrhea.   Objective: Vitals:   04/23/24 2202 04/23/24 2244 04/24/24 0156 04/24/24 0550  BP: 123/73 123/73 120/72 (!) 153/79  Pulse: (!) 107 (!) 107 86 94  Resp: 20  18 18   Temp: 99.1 F (37.3 C)  98.4 F (36.9 C) 98.4 F (36.9 C)  TempSrc: Oral  Oral Oral  SpO2: 98%  94% 95%  Weight:      Height:        Intake/Output Summary (Last 24 hours) at 04/24/2024 1255 Last data filed at 04/24/2024 0850 Gross per 24 hour  Intake 1682.5 ml  Output --  Net 1682.5 ml   Filed Weights   04/22/24 1619  Weight: 77.1 kg  Examination:  General exam: Appears calm and comfortable dry mucus membranes.  Respiratory system: no wheezing heard. Respiratory effort normal. Cardiovascular system: normal S1 & S2 heard. No JVD, murmurs, rubs, gallops or clicks. No pedal edema. Gastrointestinal system: Abdomen is nondistended, soft and nontender. No organomegaly or masses felt. Normal bowel sounds heard. Central nervous system: Alert and oriented. No focal neurological deficits. Extremities: Symmetric 5 x 5 power. Skin: No rashes,  lesions or ulcers. Psychiatry: Judgement and insight appear normal. Mood & affect appropriate.   Data Reviewed: I have personally reviewed following labs and imaging studies  CBC: Recent Labs  Lab 04/22/24 1835 04/23/24 0755 04/24/24 0439  WBC 9.9 9.0 8.1  NEUTROABS 5.8  --   --   HGB 11.3* 11.6* 10.0*  HCT 34.5* 38.2 30.6*  MCV 81.9 86.8 82.0  PLT 225 193 199    Basic Metabolic Panel: Recent Labs  Lab 04/22/24 1835 04/24/24 0439  NA 137 141  K 3.7 3.0*  CL 101 108  CO2 24 22  GLUCOSE 217* 162*  BUN 15 15  CREATININE 1.53* 1.13*  CALCIUM  8.4* 8.1*  MG  --  1.2*    CBG: Recent Labs  Lab 04/23/24 1620 04/23/24 2158 04/24/24 0216 04/24/24 0756 04/24/24 1124  GLUCAP 235* 138* 105* 155* 170*    No results found for this or any previous visit (from the past 240 hours).   Radiology Studies: Portable chest 1 View Result Date: 04/23/2024 CLINICAL DATA:  [id number] COVID [id number] EXAM: PORTABLE CHEST - 1 VIEW COMPARISON:  April 22, 2024 FINDINGS: No focal airspace consolidation, pleural effusion, or pneumothorax. Streaky left basilar atelectasis. Mild cardiomegaly. Tortuous aorta with aortic atherosclerosis. No acute fracture or destructive lesions. Multilevel thoracic osteophytosis. Right axillary surgical clips. IMPRESSION: Similar streaky left basilar atelectasis. Electronically Signed   By: Rogelia Myers M.D.   On: 04/23/2024 08:34   DG Chest Portable 1 View Result Date: 04/22/2024 CLINICAL DATA:  Cough, COVID positive EXAM: PORTABLE CHEST 1 VIEW COMPARISON:  10/13/2017 FINDINGS: Heart and mediastinal contours within normal limits. Right lung clear. Linear densities at the left base likely reflect atelectasis. No effusions or pneumothorax. No acute bony abnormality. IMPRESSION: Left base atelectasis. Electronically Signed   By: Franky Crease M.D.   On: 04/22/2024 18:21    Scheduled Meds:  enoxaparin  (LOVENOX ) injection  40 mg Subcutaneous Q24H   insulin  aspart  0-9  Units Subcutaneous TID WC   insulin  aspart  5 Units Subcutaneous TID WC   insulin  glargine-yfgn  15 Units Subcutaneous QHS   metoprolol  tartrate  12.5 mg Oral BID   Continuous Infusions:  lactated ringers  60 mL/hr at 04/24/24 1025   remdesivir  100 mg in sodium chloride  0.9 % 100 mL IVPB 100 mg (04/24/24 1026)     LOS: 1 day   Time spent: 55 mins  Danay Mckellar Vicci, MD How to contact the [facility] Attending or Consulting provider 7A - 7P or covering provider during after hours 7P -7A, for this patient?  Check the care team in [facility] and look for a) attending/consulting TR[facility] provider listed and b) the [facility] team listed Log into [email or web address] to find provider on call.  Locate the [facility] provider you are looking for under [facility] and page to a number that you can be directly reached. If you still have difficulty reaching the provider, please page the [facility] (Director on Call) for the Hospitalists listed on amion for assistance.  04/24/2024, 12:55 PM

## 2024-04-25 ENCOUNTER — Inpatient Hospital Stay (HOSPITAL_COMMUNITY)

## 2024-04-25 DIAGNOSIS — N179 Acute kidney failure, unspecified: Secondary | ICD-10-CM | POA: Diagnosis not present

## 2024-04-25 DIAGNOSIS — E782 Mixed hyperlipidemia: Secondary | ICD-10-CM | POA: Diagnosis not present

## 2024-04-25 DIAGNOSIS — U071 COVID-19: Secondary | ICD-10-CM | POA: Diagnosis not present

## 2024-04-25 DIAGNOSIS — K219 Gastro-esophageal reflux disease without esophagitis: Secondary | ICD-10-CM | POA: Diagnosis not present

## 2024-04-25 LAB — GLUCOSE, CAPILLARY
Glucose-Capillary: 198 mg/dL — ABNORMAL HIGH (ref 70–99)
Glucose-Capillary: 157 mg/dL — ABNORMAL HIGH (ref 70–99)
Glucose-Capillary: 155 mg/dL — ABNORMAL HIGH (ref 70–99)
Glucose-Capillary: 97 mg/dL (ref 70–99)
Glucose-Capillary: 312 mg/dL — ABNORMAL HIGH (ref 70–99)

## 2024-04-25 LAB — CBC
HCT: 31.9 % — ABNORMAL LOW (ref 36.0–46.0)
Hemoglobin: 10.1 g/dL — ABNORMAL LOW (ref 12.0–15.0)
MCH: 26.3 pg (ref 26.0–34.0)
MCHC: 31.7 g/dL (ref 30.0–36.0)
MCV: 83.1 fL (ref 80.0–100.0)
Platelets: 225 K/uL (ref 150–400)
RBC: 3.84 MIL/uL — ABNORMAL LOW (ref 3.87–5.11)
RDW: 14.5 % (ref 11.5–15.5)
WBC: 7.2 K/uL (ref 4.0–10.5)
nRBC: 0 % (ref 0.0–0.2)

## 2024-04-25 LAB — BASIC METABOLIC PANEL WITH GFR
Anion gap: 10 (ref 5–15)
BUN: 9 mg/dL (ref 8–23)
CO2: 21 mmol/L — ABNORMAL LOW (ref 22–32)
Calcium: 8.4 mg/dL — ABNORMAL LOW (ref 8.9–10.3)
Chloride: 106 mmol/L (ref 98–111)
Creatinine, Ser: 1 mg/dL (ref 0.44–1.00)
GFR, Estimated: 58 mL/min — ABNORMAL LOW (ref >60)
Glucose, Bld: 187 mg/dL — ABNORMAL HIGH (ref 70–99)
Potassium: 3.4 mmol/L — ABNORMAL LOW (ref 3.5–5.1)
Sodium: 137 mmol/L (ref 135–145)

## 2024-04-25 LAB — PROCALCITONIN: Procalcitonin: <0.1 ng/mL

## 2024-04-25 LAB — MAGNESIUM: Magnesium: 1.6 mg/dL — ABNORMAL LOW (ref 1.7–2.4)

## 2024-04-25 MED ORDER — GUAIFENESIN ER 600 MG PO TB12
600.0000 mg | ORAL_TABLET | Freq: Two times a day (BID) | ORAL | Status: DC
  Administered 2024-04-25 – 2024-04-26 (×3): 600 mg via ORAL
  Filled 2024-04-25 (×3): qty 1

## 2024-04-25 MED ORDER — LACTATED RINGERS IV SOLN: INTRAVENOUS | Status: AC

## 2024-04-25 MED ORDER — DEXTROMETHORPHAN POLISTIREX ER 30 MG/5ML PO SUER
30.0000 mg | Freq: Two times a day (BID) | ORAL | Status: DC | PRN
  Administered 2024-04-25: 30 mg via ORAL
  Filled 2024-04-25: qty 5

## 2024-04-25 MED ORDER — POTASSIUM CHLORIDE CRYS ER 20 MEQ PO TBCR
40.0000 meq | EXTENDED_RELEASE_TABLET | Freq: Once | ORAL | Status: AC
  Administered 2024-04-25: 40 meq via ORAL
  Filled 2024-04-25: qty 2

## 2024-04-25 MED ORDER — MAGNESIUM SULFATE 4 GM/100ML IV SOLN
4.0000 g | Freq: Once | INTRAVENOUS | Status: AC
  Administered 2024-04-25: 4 g via INTRAVENOUS
  Filled 2024-04-25: qty 100

## 2024-04-25 MED ORDER — GUAIFENESIN ER 600 MG PO TB12: 1200.0000 mg | ORAL_TABLET | Freq: Two times a day (BID) | ORAL | Status: DC

## 2024-04-25 NOTE — TOC CM/SW Note (Signed)
 Cory at [facility] accepted HHPT referral.

## 2024-04-25 NOTE — Plan of Care (Signed)
   Problem: Education: Goal: Knowledge of General Education information will improve Description Including pain rating scale, medication(s)/side effects and non-pharmacologic comfort measures Outcome: Progressing   Problem: Clinical Measurements: Goal: Diagnostic test results will improve Outcome: Progressing Goal: Respiratory complications will improve Outcome: Progressing

## 2024-04-25 NOTE — Progress Notes (Signed)
 PROGRESS NOTE   [name]  FMW:[id number] DOB: 08/18/1946 DOA: 04/22/2024 PCP: [name], MD   Chief Complaint  Patient presents with   Shortness of Breath   Level of care: Med-Surg  Brief Admission History:  78 year old female with type 2 diabetes mellitus, history of breast cancer, valvular heart disease and heart failure, GERD, hypertension presented to the emergency department complaining of generalized weakness, headaches and exertional dyspnea.  Patient had presented to an urgent care earlier in the day and diagnosed with a positive COVID test.  Patient says she has been feeling bad for the past 3 to 4 days.  Her symptoms have worsened over time.  She has had fever at home and some diarrhea and sore throat.  She denies chest pain abdominal pain nausea and emesis.  Patient says she has had at least 3 COVID vaccines.  Patient initially did not require supplemental oxygen however when ambulating her pulse ox dropped to 88% and she complained of shortness of breath when walking and she was subsequently placed on nasal cannula.  She reportedly had to stop to catch her breath a couple of times when ambulating and heart rate went up to 120 when ambulating.  She said that she did not feel good and admission was requested for further management.   Assessment and Plan:  Covid Infection  -- Patient presented with symptomatic infection, now with supplemental oxygen requirement -- Remdesivir  IV ordered per protocol x 3 day course -- complete on 8/3 -- Continue supportive measures as ordered -- Incentive spirometry -- Wean to room air as able -- Fortunately patient has had COVID vaccination -- portable CXR and procalcitonin reassuring, no bacterial pneumonia suspected   AKI - RESOLVED   -- Prerenal given dehydration and poor oral intake -- Treatment with IV fluid hydration ordered -- Monitor BMP   Uncontrolled type 2 diabetes mellitus with hyperglycemia -- Follow-up hemoglobin A1c  8.7%  -- For now resume basal insulin  with prandial coverage, SSI and CBG monitor  CBG (last 3)  Recent Labs    04/24/24 2119 04/25/24 0309 04/25/24 0727  GLUCAP 251* 198* 157*   Generalized weakness -- Multifactorial given COVID infection, dehydration and AKI -- Treating supportively -- Requested PT evaluation--SNF recommended, pt/family declined but [facility]   Essential hypertension -- Blood pressures are soft now in setting of dehydration and weakness -- Resumed home metoprolol  from 25 mg twice daily with hold parameters  Hypokalemia -- replacement ordered  Hypomagnesemia -- severe -- replacement ordered  Diarrhea  -- suspect from viral illness -- imodium  and supportive care    DVT prophylaxis: enoxaparin  Code Status: Full  Family Communication: husband at bedside  Disposition: home with [facility] tomorrow 8/4  Consultants:   Procedures:   Antimicrobials:    Subjective: Pt had restlessness most of the night and wasn't able to sleep well.  Breathing has been better.   Objective: Vitals:   04/24/24 1500 04/24/24 1942 04/24/24 2153 04/25/24 0312  BP: 132/70 132/73 (!) 146/77 125/75  Pulse: 95 91 92 84  Resp: 17 18 20 20   Temp: 98.2 F (36.8 C) 98.7 F (37.1 C) 99.2 F (37.3 C) 98.8 F (37.1 C)  TempSrc: Oral Oral Oral Oral  SpO2: 97% 98% 96% 100%  Weight:      Height:        Intake/Output Summary (Last 24 hours) at 04/25/2024 0906 Last data filed at 04/25/2024 0600 Gross per 24 hour  Intake 1978.76 ml  Output --  Net 1978.76 ml   Filed Weights   04/22/24 1619  Weight: 77.1 kg   Examination:  General exam: Appears calm and comfortable dry mucus membranes.  Respiratory system: no wheezing heard. Respiratory effort normal. Cardiovascular system: normal S1 & S2 heard. No JVD, murmurs, rubs, gallops or clicks. No pedal edema. Gastrointestinal system: Abdomen is nondistended, soft and nontender. No organomegaly or masses felt. Normal bowel sounds  heard. Central nervous system: Alert and oriented. No focal neurological deficits. Extremities: Symmetric 5 x 5 power. Skin: No rashes, lesions or ulcers. Psychiatry: Judgement and insight appear normal. Mood & affect appropriate.   Data Reviewed: I have personally reviewed following labs and imaging studies  CBC: Recent Labs  Lab 04/22/24 1835 04/23/24 0755 04/24/24 0439 04/25/24 0400  WBC 9.9 9.0 8.1 7.2  NEUTROABS 5.8  --   --   --   HGB 11.3* 11.6* 10.0* 10.1*  HCT 34.5* 38.2 30.6* 31.9*  MCV 81.9 86.8 82.0 83.1  PLT 225 193 199 225    Basic Metabolic Panel: Recent Labs  Lab 04/22/24 1835 04/24/24 0439 04/25/24 0400  NA 137 141 137  K 3.7 3.0* 3.4*  CL 101 108 106  CO2 24 22 21*  GLUCOSE 217* 162* 187*  BUN 15 15 9   CREATININE 1.53* 1.13* 1.00  CALCIUM  8.4* 8.1* 8.4*  MG  --  1.2* 1.6*    CBG: Recent Labs  Lab 04/24/24 1124 04/24/24 1611 04/24/24 2119 04/25/24 0309 04/25/24 0727  GLUCAP 170* 323* 251* 198* 157*    No results found for this or any previous visit (from the past 240 hours).   Radiology Studies: DG CHEST PORT 1 VIEW Result Date: 04/25/2024 EXAM: 1 VIEW XRAY OF THE CHEST 04/25/2024 05:07:00 AM COMPARISON: 05/04/2024 CLINICAL HISTORY: [id number] COVID [id number]. COVID [id number] COVID [id number]. COVID FINDINGS: LUNGS AND PLEURA: No focal pulmonary opacity. No pulmonary edema. No pleural effusion. No pneumothorax. HEART AND MEDIASTINUM: No acute abnormality of the cardiac and mediastinal silhouettes. Atheromatous aorta. BONES AND SOFT TISSUES: No acute osseous abnormality. Surgical clips, right axilla. IMPRESSION: 1. No acute process. Electronically signed by: Dayne Hassell MD 04/25/2024 07:57 AM EDT RP Workstation: [id number]    Scheduled Meds:  enoxaparin  (LOVENOX ) injection  40 mg Subcutaneous Q24H   guaiFENesin   600 mg Oral BID   insulin  aspart  0-9 Units Subcutaneous TID WC   insulin  aspart  5 Units Subcutaneous TID WC   insulin  glargine-yfgn  15  Units Subcutaneous QHS   metoprolol  tartrate  25 mg Oral BID   potassium chloride   40 mEq Oral Once   Continuous Infusions:  lactated ringers      magnesium  sulfate bolus IVPB     remdesivir  100 mg in sodium chloride  0.9 % 100 mL IVPB Stopped (04/24/24 1056)     LOS: 2 days   Time spent: 55 mins  Lamiah Marmol Vicci, MD How to contact the [facility] Attending or Consulting provider 7A - 7P or covering provider during after hours 7P -7A, for this patient?  Check the care team in [facility] and look for a) attending/consulting TR[facility] provider listed and b) the TR[facility] team listed Log into [email or web address] to find provider on call.  Locate the [facility] provider you are looking for under [facility] and page to a number that you can be directly reached. If you still have difficulty reaching the provider, please page the [facility] (Director on Call) for the Hospitalists listed on amion for assistance.  04/25/2024, 9:06 AM

## 2024-04-25 NOTE — Discharge Instructions (Signed)
  [name] will contact you to set up your home physical therapy* [address] [place], [place], [address] [phone]    IMPORTANT INFORMATION: PAY CLOSE ATTENTION   PHYSICIAN DISCHARGE INSTRUCTIONS  Follow with Primary care provider  [name], MD  and other consultants as instructed by your Hospitalist Physician  SEEK MEDICAL CARE OR RETURN TO EMERGENCY ROOM IF SYMPTOMS COME BACK, WORSEN OR NEW PROBLEM DEVELOPS   Please note: You were cared for by a hospitalist during your hospital stay. Every effort will be made to forward records to your primary care provider.  You can request that your primary care provider send for your hospital records if they have not received them.  Once you are discharged, your primary care physician will handle any further medical issues. Please note that NO REFILLS for any discharge medications will be authorized once you are discharged, as it is imperative that you return to your primary care physician (or establish a relationship with a primary care physician if you do not have one) for your post hospital discharge needs so that they can reassess your need for medications and monitor your lab values.  Please get a complete blood count and chemistry panel checked by your Primary MD at your next visit, and again as instructed by your Primary MD.  Get Medicines reviewed and adjusted: Please take all your medications with you for your next visit with your Primary MD  Laboratory/radiological data: Please request your Primary MD to go over all hospital tests and procedure/radiological results at the follow up, please ask your primary care provider to get all Hospital records sent to his/her office.  In some cases, they will be blood work, cultures and biopsy results pending at the time of your discharge. Please request that your primary care provider follow up on these results.  If you are diabetic, please bring your blood  sugar readings with you to your follow up appointment with primary care.    Please call and make your follow up appointments as soon as possible.    Also Note the following: If you experience worsening of your admission symptoms, develop shortness of breath, life threatening emergency, suicidal or homicidal thoughts you must seek medical attention immediately by calling 911 or calling your MD immediately  if symptoms less severe.  You must read complete instructions/literature along with all the possible adverse reactions/side effects for all the Medicines you take and that have been prescribed to you. Take any new Medicines after you have completely understood and accpet all the possible adverse reactions/side effects.   Do not drive when taking Pain medications or sleeping medications (Benzodiazepines)  Do not take more than prescribed Pain, Sleep and Anxiety Medications. It is not advisable to combine anxiety,sleep and pain medications without talking with your primary care practitioner  Special Instructions: If you have smoked or chewed Tobacco  in the last 2 yrs please stop smoking, stop any regular Alcohol   and or any Recreational drug use.  Wear Seat belts while driving.  Do not drive if taking any narcotic, mind altering or controlled substances or recreational drugs or alcohol .

## 2024-04-26 DIAGNOSIS — E782 Mixed hyperlipidemia: Secondary | ICD-10-CM | POA: Diagnosis not present

## 2024-04-26 DIAGNOSIS — U071 COVID-19: Secondary | ICD-10-CM | POA: Diagnosis not present

## 2024-04-26 DIAGNOSIS — E1143 Type 2 diabetes mellitus with diabetic autonomic (poly)neuropathy: Secondary | ICD-10-CM

## 2024-04-26 DIAGNOSIS — N179 Acute kidney failure, unspecified: Secondary | ICD-10-CM | POA: Diagnosis not present

## 2024-04-26 DIAGNOSIS — R531 Weakness: Secondary | ICD-10-CM

## 2024-04-26 LAB — BASIC METABOLIC PANEL WITH GFR
Anion gap: 7 (ref 5–15)
BUN: 8 mg/dL (ref 8–23)
CO2: 22 mmol/L (ref 22–32)
Calcium: 8.2 mg/dL — ABNORMAL LOW (ref 8.9–10.3)
Chloride: 107 mmol/L (ref 98–111)
Creatinine, Ser: 0.95 mg/dL (ref 0.44–1.00)
GFR, Estimated: >60 mL/min (ref >60)
Glucose, Bld: 303 mg/dL — ABNORMAL HIGH (ref 70–99)
Potassium: 4 mmol/L (ref 3.5–5.1)
Sodium: 136 mmol/L (ref 135–145)

## 2024-04-26 LAB — CBC
HCT: 32.1 % — ABNORMAL LOW (ref 36.0–46.0)
Hemoglobin: 10.6 g/dL — ABNORMAL LOW (ref 12.0–15.0)
MCH: 27.4 pg (ref 26.0–34.0)
MCHC: 33 g/dL (ref 30.0–36.0)
MCV: 82.9 fL (ref 80.0–100.0)
Platelets: 228 K/uL (ref 150–400)
RBC: 3.87 MIL/uL (ref 3.87–5.11)
RDW: 14.4 % (ref 11.5–15.5)
WBC: 8.1 K/uL (ref 4.0–10.5)
nRBC: 0 % (ref 0.0–0.2)

## 2024-04-26 LAB — GLUCOSE, CAPILLARY
Glucose-Capillary: 294 mg/dL — ABNORMAL HIGH (ref 70–99)
Glucose-Capillary: 227 mg/dL — ABNORMAL HIGH (ref 70–99)
Glucose-Capillary: 243 mg/dL — ABNORMAL HIGH (ref 70–99)

## 2024-04-26 MED ORDER — DEXTROMETHORPHAN POLISTIREX ER 30 MG/5ML PO SUER: 30.0000 mg | Freq: Two times a day (BID) | ORAL | 0 refills | Status: DC | PRN

## 2024-04-26 MED ORDER — GUAIFENESIN ER 600 MG PO TB12: 600.0000 mg | ORAL_TABLET | Freq: Two times a day (BID) | ORAL | 0 refills | Status: AC

## 2024-04-26 MED ORDER — LIVING WELL WITH DIABETES BOOK: Freq: Once | Status: AC

## 2024-04-26 NOTE — Discharge Summary (Signed)
 Physician Discharge Summary  [name] FMW:[id number] DOB: April 10, 1946 DOA: 04/22/2024  PCP: [name], MD  Admit date: 04/22/2024 Discharge date: 04/26/2024  Admitted From:  HOME  Disposition: Home with HH (Declined SNF)  Recommendations for Outpatient Follow-up:  Follow up with PCP in 2 weeks  Home Health:  PT  Discharge Condition: STABLE   CODE STATUS: FULL DIET: heart healthy/carb modified   Brief Hospitalization Summary: Please see all hospital notes, images, labs for full details of the hospitalization. 78 year old female with type 2 diabetes mellitus, history of breast cancer, valvular heart disease and heart failure, GERD, hypertension presented to the emergency department complaining of generalized weakness, headaches and exertional dyspnea.  Patient had presented to an urgent care earlier in the day and diagnosed with a positive COVID test.  Patient says she has been feeling bad for the past 3 to 4 days.  Her symptoms have worsened over time.  She has had fever at home and some diarrhea and sore throat.  She denies chest pain abdominal pain nausea and emesis.  Patient says she has had at least 3 COVID vaccines.  Patient initially did not require supplemental oxygen however when ambulating her pulse ox dropped to 88% and she complained of shortness of breath when walking and she was subsequently placed on nasal cannula.  She reportedly had to stop to catch her breath a couple of times when ambulating and heart rate went up to 120 when ambulating.  She said that she did not feel good and admission was requested for further management.  Hospital Course by listed problems addressed  Covid Infection  -- Patient presented with symptomatic infection, now with supplemental oxygen requirement -- Remdesivir  IV ordered per protocol x 3 day course -- completed on 8/3 -- Continue supportive measures as ordered -- Incentive spirometry -- Wean to room air as able -- Fortunately  patient has had COVID vaccination -- portable CXR and procalcitonin reassuring, no bacterial pneumonia suspected   AKI - RESOLVED   -- Prerenal given dehydration and poor oral intake -- Treatment with IV fluid hydration ordered -- Monitor BMP   Uncontrolled type 2 diabetes mellitus with hyperglycemia -- Follow-up hemoglobin A1c 8.7%  -- For now resume basal insulin  with prandial coverage, SSI and CBG monitor -- follow up with PCP for ongoing management -- resume home treatment plan at discharge  Generalized weakness -- Multifactorial given COVID infection, dehydration and AKI -- Treating supportively -- Requested PT evaluation--SNF recommended, pt/family declined but [facility] to [facility]   Essential hypertension -- Blood pressures are soft now in setting of dehydration and weakness -- Resumed home metoprolol  from 25 mg twice daily with hold parameters   Hypokalemia -- replacement ordered   Hypomagnesemia -- severe -- replacement ordered   Diarrhea  -- suspect from viral illness -- imodium  and supportive care     Discharge Diagnoses:  Principal Problem:   COVID-19 virus infection Active Problems:   Hyperlipidemia   Essential hypertension   GERD (gastroesophageal reflux disease)   IBS (irritable bowel syndrome)   Uncontrolled type 2 diabetes mellitus with hyperglycemia, without long-term current use of insulin  (HCC)   Esophageal dysphagia   Diabetic neuropathy (HCC)   Chronic fatigue   Peripheral neuropathy   Generalized weakness   AKI (acute kidney injury) (HCC)   COVID   Discharge Instructions:  Allergies as of 04/26/2024       Reactions   Demerol [meperidine] Other (See Comments)   Lost consciousness   Metformin  And  Related Diarrhea   Statins Other (See Comments)   Myalgia    Ace Inhibitors Cough   Bentyl  [dicyclomine  Hcl] Swelling   Invokana [canagliflozin] Hives   Protonix  [pantoprazole  Sodium] Swelling        Medication List     TAKE these  medications    acetaminophen  325 MG tablet Commonly known as: TYLENOL  Take 325 mg by mouth every 6 (six) hours as needed for mild pain.   amLODipine  5 MG tablet Commonly known as: NORVASC  Take 1 tablet (5 mg total) by mouth daily.   CINNAMON PO Take 1 capsule by mouth. Takes sometimes   dextromethorphan  30 MG/5ML liquid Commonly known as: DELSYM  Take 5 mLs (30 mg total) by mouth 2 (two) times daily as needed for cough.   docusate sodium  50 MG capsule Commonly known as: COLACE Take 1 capsule (50 mg total) by mouth daily as needed for mild constipation.   DULoxetine  60 MG capsule Commonly known as: CYMBALTA  Take 1 capsule (60 mg total) by mouth daily.   ferrous sulfate  325 (65 FE) MG EC tablet Take 1 tablet (325 mg total) by mouth every other day.   glipiZIDE  10 MG tablet Commonly known as: GLUCOTROL  Take 1 tablet (10 mg total) by mouth 2 (two) times daily before a meal.   guaiFENesin  600 MG 12 hr tablet Commonly known as: MUCINEX  Take 1 tablet (600 mg total) by mouth 2 (two) times daily for 5 days.   Lantus  SoloStar 100 UNIT/ML Solostar Pen Generic drug: insulin  glargine Inject 20 Units into the skin 2 (two) times daily.   metFORMIN  500 MG 24 hr tablet Commonly known as: GLUCOPHAGE -XR TAKE ONE TABLET BY MOUTH ONCE DAILY WITH BREAKFAST. What changed: See the new instructions.   metoprolol  tartrate 25 MG tablet Commonly known as: LOPRESSOR  Take 1 tablet (25 mg total) by mouth 2 (two) times daily.   multivitamin tablet Take 1 tablet by mouth daily.   Vitamin D  50 MCG (2000 UT) Caps Take 1 capsule (2,000 Units total) by mouth daily.        Follow-up Information     [name], MD. Schedule an appointment as soon as possible for a visit in 2 week(s).   Specialty: Internal Medicine Why: Hospital Follow Up Contact information: [address] [place] [address] [phone]                Allergies  Allergen Reactions   Demerol  [Meperidine] Other (See Comments)    Lost consciousness   Metformin  And Related Diarrhea   Statins Other (See Comments)    Myalgia    Ace Inhibitors Cough   Bentyl  [Dicyclomine  Hcl] Swelling   Invokana [Canagliflozin] Hives   Protonix  [Pantoprazole  Sodium] Swelling   Allergies as of 04/26/2024       Reactions   Demerol [meperidine] Other (See Comments)   Lost consciousness   Metformin  And Related Diarrhea   Statins Other (See Comments)   Myalgia    Ace Inhibitors Cough   Bentyl  [dicyclomine  Hcl] Swelling   Invokana [canagliflozin] Hives   Protonix  [pantoprazole  Sodium] Swelling        Medication List     TAKE these medications    acetaminophen  325 MG tablet Commonly known as: TYLENOL  Take 325 mg by mouth every 6 (six) hours as needed for mild pain.   amLODipine  5 MG tablet Commonly known as: NORVASC  Take 1 tablet (5 mg total) by mouth daily.   CINNAMON PO Take 1 capsule by mouth.  Takes sometimes   dextromethorphan  30 MG/5ML liquid Commonly known as: DELSYM  Take 5 mLs (30 mg total) by mouth 2 (two) times daily as needed for cough.   docusate sodium  50 MG capsule Commonly known as: COLACE Take 1 capsule (50 mg total) by mouth daily as needed for mild constipation.   DULoxetine  60 MG capsule Commonly known as: CYMBALTA  Take 1 capsule (60 mg total) by mouth daily.   ferrous sulfate  325 (65 FE) MG EC tablet Take 1 tablet (325 mg total) by mouth every other day.   glipiZIDE  10 MG tablet Commonly known as: GLUCOTROL  Take 1 tablet (10 mg total) by mouth 2 (two) times daily before a meal.   guaiFENesin  600 MG 12 hr tablet Commonly known as: MUCINEX  Take 1 tablet (600 mg total) by mouth 2 (two) times daily for 5 days.   Lantus  SoloStar 100 UNIT/ML Solostar Pen Generic drug: insulin  glargine Inject 20 Units into the skin 2 (two) times daily.   metFORMIN  500 MG 24 hr tablet Commonly known as: GLUCOPHAGE -XR TAKE ONE TABLET BY MOUTH ONCE DAILY WITH  BREAKFAST. What changed: See the new instructions.   metoprolol  tartrate 25 MG tablet Commonly known as: LOPRESSOR  Take 1 tablet (25 mg total) by mouth 2 (two) times daily.   multivitamin tablet Take 1 tablet by mouth daily.   Vitamin D  50 MCG (2000 UT) Caps Take 1 capsule (2,000 Units total) by mouth daily.        Procedures/Studies: DG CHEST PORT 1 VIEW Result Date: 04/25/2024 EXAM: 1 VIEW XRAY OF THE CHEST 04/25/2024 05:07:00 AM COMPARISON: 05/04/2024 CLINICAL HISTORY: [id number] COVID [id number]. COVID L129006 COVID L129006. COVID FINDINGS: LUNGS AND PLEURA: No focal pulmonary opacity. No pulmonary edema. No pleural effusion. No pneumothorax. HEART AND MEDIASTINUM: No acute abnormality of the cardiac and mediastinal silhouettes. Atheromatous aorta. BONES AND SOFT TISSUES: No acute osseous abnormality. Surgical clips, right axilla. IMPRESSION: 1. No acute process. Electronically signed by: Katheleen Faes MD 04/25/2024 07:57 AM EDT RP Workstation: [id number]   Portable chest 1 View Result Date: 04/23/2024 CLINICAL DATA:  [id number] COVID [id number] EXAM: PORTABLE CHEST - 1 VIEW COMPARISON:  April 22, 2024 FINDINGS: No focal airspace consolidation, pleural effusion, or pneumothorax. Streaky left basilar atelectasis. Mild cardiomegaly. Tortuous aorta with aortic atherosclerosis. No acute fracture or destructive lesions. Multilevel thoracic osteophytosis. Right axillary surgical clips. IMPRESSION: Similar streaky left basilar atelectasis. Electronically Signed   By: Rogelia Myers M.D.   On: 04/23/2024 08:34   DG Chest Portable 1 View Result Date: 04/22/2024 CLINICAL DATA:  Cough, COVID positive EXAM: PORTABLE CHEST 1 VIEW COMPARISON:  10/13/2017 FINDINGS: Heart and mediastinal contours within normal limits. Right lung clear. Linear densities at the left base likely reflect atelectasis. No effusions or pneumothorax. No acute bony abnormality. IMPRESSION: Left base atelectasis. Electronically Signed   By:  Franky Crease M.D.   On: 04/22/2024 18:21     Subjective: Pt says she feels better and breathing better and wanting to go home today.  No other complaints.   Discharge Exam: Vitals:   04/25/24 1946 04/26/24 0615  BP: 130/84 (!) 147/76  Pulse: 98 85  Resp: 20 20  Temp: 99.3 F (37.4 C) 98.2 F (36.8 C)  SpO2: 100% 97%   Vitals:   04/25/24 1413 04/25/24 1416 04/25/24 1946 04/26/24 0615  BP: (!) 180/92 (!) 164/85 130/84 (!) 147/76  Pulse: 77  98 85  Resp: 20  20 20   Temp: 98 F (36.7 C)  99.3 F (37.4 C)  98.2 F (36.8 C)  TempSrc: Oral  Oral Oral  SpO2: 100%  100% 97%  Weight:      Height:        General: Pt is alert, awake, not in acute distress Cardiovascular: RRR, S1/S2 +, no rubs, no gallops Respiratory: CTA bilaterally, no wheezing, no rhonchi Abdominal: Soft, NT, ND, bowel sounds + Extremities: no edema, no cyanosis   The results of significant diagnostics from this hospitalization (including imaging, microbiology, ancillary and laboratory) are listed below for reference.     Microbiology: No results found for this or any previous visit (from the past 240 hours).   Labs: BNP (last 3 results) No results for input(s): BNP in the last 8760 hours. Basic Metabolic Panel: Recent Labs  Lab 04/22/24 1835 04/24/24 0439 04/25/24 0400 04/26/24 0439  NA 137 141 137 136  K 3.7 3.0* 3.4* 4.0  CL 101 108 106 107  CO2 24 22 21* 22  GLUCOSE 217* 162* 187* 303*  BUN 15 15 9 8   CREATININE 1.53* 1.13* 1.00 0.95  CALCIUM  8.4* 8.1* 8.4* 8.2*  MG  --  1.2* 1.6*  --    Liver Function Tests: Recent Labs  Lab 04/22/24 1835  AST 25  ALT 19  ALKPHOS 62  BILITOT 1.2  PROT 7.1  ALBUMIN 3.3*   No results for input(s): LIPASE, AMYLASE in the last 168 hours. No results for input(s): AMMONIA in the last 168 hours. CBC: Recent Labs  Lab 04/22/24 1835 04/23/24 0755 04/24/24 0439 04/25/24 0400 04/26/24 0439  WBC 9.9 9.0 8.1 7.2 8.1  NEUTROABS 5.8  --   --    --   --   HGB 11.3* 11.6* 10.0* 10.1* 10.6*  HCT 34.5* 38.2 30.6* 31.9* 32.1*  MCV 81.9 86.8 82.0 83.1 82.9  PLT 225 193 199 225 228   Cardiac Enzymes: No results for input(s): CKTOTAL, CKMB, CKMBINDEX, TROPONINI in the last 168 hours. BNP: Invalid input(s): POCBNP CBG: Recent Labs  Lab 04/25/24 1159 04/25/24 1647 04/25/24 2010 04/26/24 0328 04/26/24 0725  GLUCAP 155* 97 312* 294* 227*   D-Dimer No results for input(s): DDIMER in the last 72 hours. Hgb A1c No results for input(s): HGBA1C in the last 72 hours. Lipid Profile No results for input(s): CHOL, HDL, LDLCALC, TRIG, CHOLHDL, LDLDIRECT in the last 72 hours. Thyroid  function studies No results for input(s): TSH, T4TOTAL, T3FREE, THYROIDAB in the last 72 hours.  Invalid input(s): FREET3 Anemia work up No results for input(s): VITAMINB12, FOLATE, FERRITIN, TIBC, IRON, RETICCTPCT in the last 72 hours. Urinalysis    Component Value Date/Time   COLORURINE AMBER (A) 04/22/2024 2000   APPEARANCEUR CLOUDY (A) 04/22/2024 2000   LABSPEC 1.019 04/22/2024 2000   PHURINE 5.0 04/22/2024 2000   GLUCOSEU 150 (A) 04/22/2024 2000   HGBUR NEGATIVE 04/22/2024 2000   HGBUR negative 07/27/2009 1553   BILIRUBINUR NEGATIVE 04/22/2024 2000   BILIRUBINUR neg 12/28/2013 1514   KETONESUR NEGATIVE 04/22/2024 2000   PROTEINUR 100 (A) 04/22/2024 2000   UROBILINOGEN 0.2 12/28/2013 1514   UROBILINOGEN negative 07/27/2009 1553   NITRITE NEGATIVE 04/22/2024 2000   LEUKOCYTESUR NEGATIVE 04/22/2024 2000   Sepsis Labs Recent Labs  Lab 04/23/24 0755 04/24/24 0439 04/25/24 0400 04/26/24 0439  WBC 9.0 8.1 7.2 8.1   Microbiology No results found for this or any previous visit (from the past 240 hours).  Time coordinating discharge: 37 mins  SIGNED:  Afton Louder, MD  [facility] 04/26/2024, 10:50 AM How to contact the  [facility] Attending or Consulting provider 7A - 7P or covering  provider during after hours 7P -7A, for this patient?  Check the care team in [facility] and look for a) attending/consulting TR[facility] provider listed and b) the TR[facility] team listed Log into [email or web address] and use [facility] universal password to access. If you do not have the password, please contact the hospital operator. Locate the [facility] provider you are looking for under [facility] and page to a number that you can be directly reached. If you still have difficulty reaching the provider, please page the [facility] (Director on Call) for the Hospitalists listed on amion for assistance.

## 2024-04-26 NOTE — Plan of Care (Signed)
  Problem: Education: Goal: Ability to describe self-care measures that may prevent or decrease complications (Diabetes Survival Skills Education) will improve Outcome: Progressing Goal: Individualized Educational Video(s) Outcome: Progressing   Problem: Coping: Goal: Ability to adjust to condition or change in health will improve Outcome: Progressing   Problem: Fluid Volume: Goal: Ability to maintain a balanced intake and output will improve Outcome: Progressing   Problem: Health Behavior/Discharge Planning: Goal: Ability to identify and utilize available resources and services will improve Outcome: Progressing Goal: Ability to manage health-related needs will improve Outcome: Progressing   Problem: Metabolic: Goal: Ability to maintain appropriate glucose levels will improve Outcome: Progressing   Problem: Nutritional: Goal: Maintenance of adequate nutrition will improve Outcome: Progressing Goal: Progress toward achieving an optimal weight will improve Outcome: Progressing   Problem: Skin Integrity: Goal: Risk for impaired skin integrity will decrease Outcome: Progressing   Problem: Tissue Perfusion: Goal: Adequacy of tissue perfusion will improve Outcome: Progressing   Problem: Education: Goal: Knowledge of risk factors and measures for prevention of condition will improve Outcome: Progressing   Problem: Coping: Goal: Psychosocial and spiritual needs will be supported Outcome: Progressing   Problem: Respiratory: Goal: Will maintain a patent airway Outcome: Progressing Goal: Complications related to the disease process, condition or treatment will be avoided or minimized Outcome: Progressing   Problem: Education: Goal: Knowledge of General Education information will improve Description: Including pain rating scale, medication(s)/side effects and non-pharmacologic comfort measures Outcome: Progressing   Problem: Health Behavior/Discharge Planning: Goal:  Ability to manage health-related needs will improve Outcome: Progressing   Problem: Clinical Measurements: Goal: Ability to maintain clinical measurements within normal limits will improve Outcome: Progressing Goal: Will remain free from infection Outcome: Progressing Goal: Diagnostic test results will improve Outcome: Progressing Goal: Respiratory complications will improve Outcome: Progressing Goal: Cardiovascular complication will be avoided Outcome: Progressing   Problem: Activity: Goal: Risk for activity intolerance will decrease Outcome: Progressing   Problem: Nutrition: Goal: Adequate nutrition will be maintained Outcome: Progressing   Problem: Coping: Goal: Level of anxiety will decrease Outcome: Progressing   Problem: Elimination: Goal: Will not experience complications related to bowel motility Outcome: Progressing Goal: Will not experience complications related to urinary retention Outcome: Progressing   Problem: Pain Managment: Goal: General experience of comfort will improve and/or be controlled Outcome: Progressing   Problem: Safety: Goal: Ability to remain free from injury will improve Outcome: Progressing   Problem: Skin Integrity: Goal: Risk for impaired skin integrity will decrease Outcome: Progressing

## 2024-04-26 NOTE — TOC Transition Note (Signed)
 Transition of Care [facility]) - Discharge Note   Patient Details  Name: [name] MRN: [id number] Date of Birth: 1945-12-16  [facility] CM/SW Contact:  Sharlyne Stabs, RN Phone Number: 04/26/2024, 11:20 AM   Clinical Narrative:   Patient discharging home with [facility] health PT. Orders placed and Cory updated.     Final next level of care: Home w Home Health Services Barriers to Discharge: Barriers Resolved   Patient Goals and CMS Choice Patient states their goals for this hospitalization and ongoing recovery are:: return home CMS Medicare.gov Compare Post Acute Care list provided to:: Patient Represenative (must comment) (Spouse and daughter Emmie) Choice offered to / list presented to : Spouse, Adult Children      Discharge Placement                    Patient and family notified of of transfer: 04/26/24  Discharge Plan and Services Additional resources added to the After Visit Summary for   In-house Referral: Clinical Social Work   Post Acute Care Choice: Durable Medical Equipment                    HH Arranged: PT HH Agency: [facility] Date [facility] Agency Contacted: 04/24/24 Time HH Agency Contacted: 1331 Representative spoke with at [facility] Agency: Darleene  Social Drivers of Health (SDOH) Interventions SDOH Screenings   Food Insecurity: No Food Insecurity (04/23/2024)  Housing: Low Risk  (04/23/2024)  Transportation Needs: No Transportation Needs (04/23/2024)  Utilities: Not At Risk (04/23/2024)  Alcohol  Screen: Low Risk  (12/23/2023)  Depression (PHQ2-9): Low Risk  (02/06/2024)  Recent Concern: Depression (PHQ2-9) - Medium Risk (12/25/2023)  Financial Resource Strain: Low Risk  (12/23/2023)  Physical Activity: Unknown (12/23/2023)  Social Connections: Socially Integrated (04/23/2024)  Stress: Stress Concern Present (12/23/2023)  Tobacco Use: Low Risk  (04/23/2024)     Readmission Risk Interventions    04/24/2024    1:26 PM  Readmission Risk  Prevention Plan  Transportation Screening Complete  Home Care Screening Complete  Medication Review (RN CM) Complete

## 2024-04-27 ENCOUNTER — Telehealth: Payer: Self-pay

## 2024-04-27 NOTE — Transitions of Care (Post Inpatient/ED Visit) (Signed)
 04/27/2024  Name: [name] MRN: [id number] DOB: 1945/12/27  Today's TOC FU Call Status: Today's TOC FU Call Status:: Successful TOC FU Call Completed TOC FU Call Complete Date: 04/27/24 Patient's Name and Date of Birth confirmed.  Transition Care Management Follow-up Telephone Call Date of Discharge: 04/26/24 Discharge Facility: [name] ([facility]) Type of Discharge: Inpatient Admission Primary Inpatient Discharge Diagnosis:: COVID How have you been since you were released from the hospital?: Better Any questions or concerns?: No  Items Reviewed: Did you receive and understand the discharge instructions provided?: Yes Medications obtained,verified, and reconciled?: Yes (Medications Reviewed) Any new allergies since your discharge?: No Dietary orders reviewed?: Yes Do you have support at home?: Yes People in Home [RPT]: spouse  Medications Reviewed Today: Medications Reviewed Today     Reviewed by Emmitt Pan, LPN (Licensed Practical Nurse) on 04/27/24 at 1050  Med List Status: <None>   Medication Order Taking? Sig Documenting Provider Last Dose Status Informant  acetaminophen  (TYLENOL ) 325 MG tablet [id number] Yes Take 325 mg by mouth every 6 (six) hours as needed for mild pain. [provider]  Active Pharmacy Records, Self  amLODipine  (NORVASC ) 5 MG tablet [id number] Yes Take 1 tablet (5 mg total) by mouth daily. [name], MD  Active Pharmacy Records, Self  Cholecalciferol (VITAMIN D ) 50 MCG 843 184 1611 UT) CAPS [id number] Yes Take 1 capsule (2,000 Units total) by mouth daily. [name]  Active Pharmacy Records, Self  CINNAMON PO [id number] Yes Take 1 capsule by mouth. Takes sometimes [provider]  Active Pharmacy Records, Self  dextromethorphan  (DELSYM ) 30 MG/5ML liquid [id number] Yes Take 5 mLs (30 mg total) by mouth 2 (two) times daily as needed for cough. Johnson, Clanford L, MD  Active   docusate sodium  (COLACE) 50 MG capsule  [id number] Yes Take 1 capsule (50 mg total) by mouth daily as needed for mild constipation. [name], PA-C  Active Pharmacy Records, Self  DULoxetine  (CYMBALTA ) 60 MG capsule [id number] Yes Take 1 capsule (60 mg total) by mouth daily. Gregg Lek, MD  Active Pharmacy Records, Self  ferrous sulfate  325 (65 FE) MG EC tablet [id number] Yes Take 1 tablet (325 mg total) by mouth every other day. [name], PA-C  Active Pharmacy Records, Self  glipiZIDE  (GLUCOTROL ) 10 MG tablet [id number] Yes Take 1 tablet (10 mg total) by mouth 2 (two) times daily before a meal. [name], MD  Active Pharmacy Records, Self  guaiFENesin  (MUCINEX ) 600 MG 12 hr tablet [id number] Yes Take 1 tablet (600 mg total) by mouth 2 (two) times daily for 5 days. Johnson, Clanford L, MD  Active   insulin  glargine (LANTUS  SOLOSTAR) 100 UNIT/ML Solostar Pen [id number] Yes Inject 20 Units into the skin 2 (two) times daily. [name], MD  Active Pharmacy Records, Self  metFORMIN  (GLUCOPHAGE -XR) 500 MG 24 hr tablet [id number] Yes TAKE ONE TABLET BY MOUTH ONCE DAILY WITH BREAKFAST.  Patient taking differently: Take 500 mg by mouth daily with breakfast.   [name], MD  Active Pharmacy Records, Self  metoprolol  tartrate (LOPRESSOR ) 25 MG tablet [id number] Yes Take 1 tablet (25 mg total) by mouth 2 (two) times daily. [name], MD  Active Pharmacy Records, Self  Multiple Vitamin (MULTIVITAMIN) tablet [id number] Yes Take 1 tablet by mouth daily. [provider]  Active Pharmacy Records, Self            Home Care and Equipment/Supplies: Were Home Health Services Ordered?: NA Any  new equipment or medical supplies ordered?: NA  Functional Questionnaire: Do you need assistance with bathing/showering or dressing?: No Do you need assistance with meal preparation?: No Do you need assistance with eating?: No Do you have difficulty maintaining continence: No Do you need assistance with  getting out of bed/getting out of a chair/moving?: No Do you have difficulty managing or taking your medications?: No  Follow up appointments reviewed: PCP Follow-up appointment confirmed?: Yes Date of PCP follow-up appointment?: 05/10/24 Follow-up Provider: [facility] Follow-up appointment confirmed?: NA Do you need transportation to your follow-up appointment?: No Do you understand care options if your condition(s) worsen?: Yes-patient verbalized understanding    SIGNATURE [name], LPN [facility] Nurse Health Advisor Direct Dial [phone]

## 2024-05-10 ENCOUNTER — Encounter: Payer: Self-pay | Admitting: Internal Medicine

## 2024-05-10 ENCOUNTER — Ambulatory Visit: Admitting: Internal Medicine

## 2024-05-10 VITALS — BP 112/70 | HR 102 | Ht 66.5 in | Wt 178.2 lb

## 2024-05-10 DIAGNOSIS — U071 COVID-19: Secondary | ICD-10-CM

## 2024-05-10 DIAGNOSIS — E1165 Type 2 diabetes mellitus with hyperglycemia: Secondary | ICD-10-CM | POA: Diagnosis not present

## 2024-05-10 DIAGNOSIS — N179 Acute kidney failure, unspecified: Secondary | ICD-10-CM

## 2024-05-10 DIAGNOSIS — I10 Essential (primary) hypertension: Secondary | ICD-10-CM | POA: Diagnosis not present

## 2024-05-10 DIAGNOSIS — Z09 Encounter for follow-up examination after completed treatment for conditions other than malignant neoplasm: Secondary | ICD-10-CM | POA: Diagnosis not present

## 2024-05-10 MED ORDER — LANTUS SOLOSTAR 100 UNIT/ML ~~LOC~~ SOPN: 26.0000 [IU] | PEN_INJECTOR | Freq: Two times a day (BID) | SUBCUTANEOUS | 2 refills | Status: DC

## 2024-05-10 NOTE — Assessment & Plan Note (Signed)
 Resolved now with IV fluids Check CMP

## 2024-05-10 NOTE — Assessment & Plan Note (Signed)
 Hospital chart reviewed, including discharge summary Medications reconciled and reviewed with the patient in detail

## 2024-05-10 NOTE — Assessment & Plan Note (Addendum)
 Lab Results  Component Value Date   HGBA1C 8.7 (H) 04/22/2024   Uncontrolled, was improving - needs follow up with Nutritionist On Lantus  20 U BID, glipizide  10 mg BID and Metformin  500 XR mg QD as she had diarrhea with regular Metformin  Increased dose of Lantus  to 26 U BID, she denies ISS Did not tolerate Jardiance , Januvia  and GLP-1 agonist in the past  She likely has mostly hyperglycemia, but due to her diet noncompliance, concern for hypoglycemia as well - she would benefit from CGM, sent freestyle libre 3 plus sensors Needs to follow diabetic diet - refer to nutritional counseling as she reports noncompliance to diabetic diet F/u CMP and lipid panel Diabetic eye exam: Advised to follow up with Ophthalmology for diabetic eye exam

## 2024-05-10 NOTE — Progress Notes (Signed)
 Established Patient Office Visit  Subjective:  Patient ID: [name], female    DOB: May 23, 1946  Age: 78 y.o. MRN: [id number]  CC:  Chief Complaint  Patient presents with   Follow-up    Hospital discharge    HPI [name] is a 78 y.o. female with past medical history of HTN, DM, breast ca s/p right mastectomy and chemotherapy, HLD, DDD of lumbar spine and GERD who presents for follow-up after recent hospitalization from 04/22/24-04/26/24.  She presented to the emergency department complaining of generalized weakness, headaches and exertional dyspnea. She was diagnosed with a positive COVID test. She was given supplemental oxygen, Remdesevir X 3 days. She had AKI, which resolved with IV fluids during hospitalization.  Her BP was wnl today.  She has been taking amlodipine  and metoprolol  now.  She denies any headache, chest pain, dyspnea or palpitations.   She takes Lantus  20 units BID, glipizide  10 mg BID and metformin  ER 500 mg QD for DM. Her HbA1C has improved to 8.7 in 02/25 from 9.0 in 02/25. She denies polyuria or polydipsia. She did not tolerate Januvia , Jardiance  and Trulicity  in the past.  She saw nutritionist and had freestyle libre. She had glucose levels in 300s in the past, but have been better lately around 150-200 mostly.  Does not report any episode of hypoglycemia recently.  She did not tolerate statin in the past and has tried taking Nexletol , sent by lipid clinic.  She complains of chronic fatigue and also reports having neuropathic pain in her legs and feet, which have been chronic, but is improved now. She takes Duloxetine  now.  Past Medical History:  Diagnosis Date   (HFpEF) heart failure with preserved ejection fraction (HCC)    Abdominal hernia 02/26/2012   unrepaired   Adenocarcinoma of breast (HCC) 1997   right / chemo + tamoxifen x 5 years    Anemia    Anxiety    Aortic atherosclerosis (HCC)    Arthritis    Blood transfusion 1980's   Breast  cancer (HCC)    Cancer (HCC)    [employer]reesia 08/18/2020   Complication of anesthesia    has a hard time waking up; can't lay flat   Degenerative disc disease, lumbar    pressing on L3 and L4   Diabetes mellitus (HCC) 1989   Type 2 NIDDM; cancer treatment gave me diabetes   Diabetes mellitus without complication (HCC)    [employer]ia 08/18/2020   Diverticulosis    DJD (degenerative joint disease) of lumbar spine    Dysrhythmia    irregular   Exertional dyspnea    Gastroparesis    GERD (gastroesophageal reflux disease)    mann   Heart murmur    think I outgrew it   Hepatic steatosis    History of kidney stones    History of stomach ulcers    Hx: UTI (urinary tract infection)    Hyperlipidemia    Hypersensitivity    in tongue   Hypertension    IBS (irritable bowel syndrome)    Insomnia    Internal hemorrhoids    Kidney cysts    RT KIDNEY   Kidney stones 2009   s/p lithotripsy   Mild CAD    Mild valvular heart disease    Nephrolithiasis 04/13/2014   Obesity    Personal history of chemotherapy    Personal history of radiation therapy    PFO (patent foramen ovale)    PONV (postoperative nausea and vomiting)  Pulmonary nodule    Shortness of breath    unable to lie flat   Ventral hernia     Past Surgical History:  Procedure Laterality Date   ABDOMINAL HYSTERECTOMY  2002   APPENDECTOMY     BACK SURGERY     Can't take any medical procedure in right arm   BREAST BIOPSY     right   BREAST SURGERY N/A    [employer]reesia 04/22/2020   CATARACT EXTRACTION W/ INTRAOCULAR LENS  IMPLANT, BILATERAL  2009-2010   CESAREAN SECTION  1973; 1978; 1981   CESAREAN SECTION N/A    [employer]ia 04/22/2020   COLONOSCOPY WITH PROPOFOL  N/A 10/29/2016   Procedure: COLONOSCOPY WITH PROPOFOL ;  Surgeon: Margo LITTIE Haddock, MD;  Location: [facility];  Service: Endoscopy;  Laterality: N/A;  10:00 am   CYSTOSCOPY Left 04/13/2014   Procedure: CYSTOSCOPY FLEXIBLE;  Surgeon: Morene LELON Salines, MD;   Location: [facility];  Service: Urology;  Laterality: Left;  with STENT   ESOPHAGOGASTRODUODENOSCOPY (EGD) WITH PROPOFOL  N/A 10/29/2016   Procedure: ESOPHAGOGASTRODUODENOSCOPY (EGD) WITH PROPOFOL ;  Surgeon: Margo LITTIE Haddock, MD;  Location: [facility];  Service: Endoscopy;  Laterality: N/A;   EYE SURGERY     implant and screws put in the right lower jaw  11/2008   dental surgery   kidney blockage  ?1990's    major surgery ;put kidney on pump for awhile   LITHOTRIPSY     several times   LUMBAR FUSION  08/2010   MASTECTOMY  1997   right   NEPHROLITHOTOMY Left 04/13/2014   Procedure: LEFT PERCUTANEOUS NEPHROLITHOTOMY WITH SURGEON ACCESS;  Surgeon: Morene LELON Salines, MD;  Location: [facility];  Service: Urology;  Laterality: Left;   OVARIAN CYST SURGERY     PARATHYROIDECTOMY  02/26/2012   Procedure: PARATHYROIDECTOMY;  Surgeon: Ana LELON Moccasin, MD;  Location: [facility];  Service: ENT;;   SAVORY DILATION N/A 10/29/2016   Procedure: SAVORY DILATION;  Surgeon: Margo LITTIE Haddock, MD;  Location: [facility];  Service: Endoscopy;  Laterality: N/A;   SPINE SURGERY  2011   urological surgery for blocked ureter secondary to kidney stone      Family History  Problem Relation Age of Onset   Birth defects Mother    Heart attack Mother    Cancer Brother        Esophageal; smoker; deceased 61s   Colon cancer Brother    Diabetes Daughter    Leukemia Paternal Aunt        deceased 15s   Pancreatic cancer Paternal Aunt        deceased 79   Cancer Paternal Aunt        GI cancer; deceased 47s   Cancer Cousin        kidney cancer; deceased 53; pat first cousin; daughter of aunt w/ leukemia   Cancer Cousin        colon cancer @ 81; pat first cousin; son of aunt with GI cancer   Anesthesia problems Neg Hx     Social History   Socioeconomic History   Marital status: Married    Spouse name: Not on file   Number of children: 3   Years of education: Not on file   Highest education level: Bachelor's degree (e.g., BA,  AB, BS)  Occupational History   Occupation: retired in 2011  Tobacco Use   Smoking status: Never   Smokeless tobacco: Never  Vaping Use   Vaping status: Never Used  Substance and  Sexual Activity   Alcohol  use: Yes    Alcohol /week: 0.0 standard drinks of alcohol     Comment: X2 DRINKS PER YEAR   Drug use: No   Sexual activity: Not Currently    Birth control/protection: Surgical  Other Topics Concern   Not on file  Social History Narrative   Right handed   Caffeine-2 cups daily occasionally   Social Drivers of Health   Financial Resource Strain: Low Risk  (05/07/2024)   Overall Financial Resource Strain (CARDIA)    Difficulty of Paying Living Expenses: Not very hard  Food Insecurity: No Food Insecurity (05/07/2024)   Hunger Vital Sign    Worried About Running Out of Food in the Last Year: Never true    Ran Out of Food in the Last Year: Never true  Transportation Needs: No Transportation Needs (05/07/2024)   PRAPARE - Administrator, Civil Service (Medical): No    Lack of Transportation (Non-Medical): No  Physical Activity: Inactive (05/07/2024)   Exercise Vital Sign    Days of Exercise per Week: 0 days    Minutes of Exercise per Session: Not on file  Stress: Stress Concern Present (05/07/2024)   [employer] of Occupational Health - Occupational Stress Questionnaire    Feeling of Stress: Rather much  Social Connections: Socially Integrated (05/07/2024)   Social Connection and Isolation Panel    Frequency of Communication with Friends and Family: More than three times a week    Frequency of Social Gatherings with Friends and Family: Twice a week    Attends Religious Services: More than 4 times per year    Active Member of [employer] or Organizations: Yes    Attends Engineer, structural: More than 4 times per year    Marital Status: Married  Catering manager Violence: Not At Risk (04/23/2024)   Humiliation, Afraid, Rape, and Kick questionnaire    Fear of  Current or Ex-Partner: No    Emotionally Abused: No    Physically Abused: No    Sexually Abused: No    Outpatient Medications Prior to Visit  Medication Sig Dispense Refill   acetaminophen  (TYLENOL ) 325 MG tablet Take 325 mg by mouth every 6 (six) hours as needed for mild pain.     amLODipine  (NORVASC ) 5 MG tablet Take 1 tablet (5 mg total) by mouth daily. 90 tablet 3   Cholecalciferol (VITAMIN D ) 50 MCG (2000 UT) CAPS Take 1 capsule (2,000 Units total) by mouth daily. 90 capsule 3   CINNAMON PO Take 1 capsule by mouth. Takes sometimes     docusate sodium  (COLACE) 50 MG capsule Take 1 capsule (50 mg total) by mouth daily as needed for mild constipation. 90 capsule 3   DULoxetine  (CYMBALTA ) 60 MG capsule Take 1 capsule (60 mg total) by mouth daily. 90 capsule 3   ferrous sulfate  325 (65 FE) MG EC tablet Take 1 tablet (325 mg total) by mouth every other day. 90 tablet 3   glipiZIDE  (GLUCOTROL ) 10 MG tablet Take 1 tablet (10 mg total) by mouth 2 (two) times daily before a meal. 180 tablet 3   metFORMIN  (GLUCOPHAGE -XR) 500 MG 24 hr tablet TAKE ONE TABLET BY MOUTH ONCE DAILY WITH BREAKFAST. (Patient taking differently: Take 500 mg by mouth daily with breakfast.) 90 tablet 0   metoprolol  tartrate (LOPRESSOR ) 25 MG tablet Take 1 tablet (25 mg total) by mouth 2 (two) times daily. 180 tablet 1   Multiple Vitamin (MULTIVITAMIN) tablet Take 1 tablet by mouth daily.  dextromethorphan  (DELSYM ) 30 MG/5ML liquid Take 5 mLs (30 mg total) by mouth 2 (two) times daily as needed for cough. 89 mL 0   insulin  glargine (LANTUS  SOLOSTAR) 100 UNIT/ML Solostar Pen Inject 20 Units into the skin 2 (two) times daily. 30 mL 2   No facility-administered medications prior to visit.    Allergies  Allergen Reactions   Demerol [Meperidine] Other (See Comments)    Lost consciousness   Metformin  And Related Diarrhea   Statins Other (See Comments)    Myalgia    Ace Inhibitors Cough   Bentyl  [Dicyclomine  Hcl] Swelling    Invokana [Canagliflozin] Hives   Protonix  [Pantoprazole  Sodium] Swelling    ROS Review of Systems  Constitutional:  Positive for fatigue. Negative for chills and fever.  HENT:  Negative for congestion, sinus pressure, sinus pain and sore throat.   Eyes:  Negative for pain and discharge.  Respiratory:  Positive for cough. Negative for shortness of breath.   Cardiovascular:  Negative for chest pain and palpitations.  Gastrointestinal:  Negative for abdominal pain, diarrhea, nausea and vomiting.  Endocrine: Negative for polydipsia and polyuria.  Genitourinary:  Negative for dysuria and hematuria.  Musculoskeletal:  Positive for arthralgias and back pain. Negative for neck pain and neck stiffness.  Skin:  Negative for rash.  Neurological:  Positive for weakness (B/l LE) and numbness (B/l hands).       Numbness and tingling in b/l LE  Psychiatric/Behavioral:  Negative for agitation and behavioral problems.       Objective:    Physical Exam Vitals reviewed.  Constitutional:      General: She is not in acute distress.    Appearance: She is not diaphoretic.  HENT:     Head: Normocephalic and atraumatic.     Nose: Nose normal.     Mouth/Throat:     Mouth: Mucous membranes are moist.  Eyes:     General: No scleral icterus.    Extraocular Movements: Extraocular movements intact.  Cardiovascular:     Rate and Rhythm: Normal rate and regular rhythm.     Heart sounds: Normal heart sounds. No murmur heard. Pulmonary:     Breath sounds: Normal breath sounds. No wheezing or rales.  Musculoskeletal:     Cervical back: Neck supple. No tenderness.     Right lower leg: No edema.     Left lower leg: No edema.  Skin:    General: Skin is warm.     Findings: No rash.  Neurological:     General: No focal deficit present.     Mental Status: She is alert and oriented to person, place, and time.     Sensory: Sensory deficit (B/l feet) present.     Motor: Weakness (B/l LE - 4/5) present.   Psychiatric:        Mood and Affect: Mood normal.        Behavior: Behavior normal.     BP 112/70   Pulse (!) 102   Ht 5' 6.5 (1.689 m)   Wt 178 lb 3.2 oz (80.8 kg)   SpO2 94%   BMI 28.33 kg/m  Wt Readings from Last 3 Encounters:  05/10/24 178 lb 3.2 oz (80.8 kg)  04/22/24 170 lb (77.1 kg)  02/06/24 177 lb 6.4 oz (80.5 kg)    Lab Results  Component Value Date   TSH 0.973 04/22/2024   Lab Results  Component Value Date   WBC 8.1 04/26/2024   HGB 10.6 (L) 04/26/2024  HCT 32.1 (L) 04/26/2024   MCV 82.9 04/26/2024   PLT 228 04/26/2024   Lab Results  Component Value Date   NA 136 04/26/2024   K 4.0 04/26/2024   CO2 22 04/26/2024   GLUCOSE 303 (H) 04/26/2024   BUN 8 04/26/2024   CREATININE 0.95 04/26/2024   BILITOT 1.2 04/22/2024   ALKPHOS 62 04/22/2024   AST 25 04/22/2024   ALT 19 04/22/2024   PROT 7.1 04/22/2024   ALBUMIN 3.3 (L) 04/22/2024   CALCIUM  8.2 (L) 04/26/2024   ANIONGAP 7 04/26/2024   EGFR 63 11/03/2023   GFR 55.96 (L) 02/23/2020   Lab Results  Component Value Date   CHOL 213 (H) 11/03/2023   Lab Results  Component Value Date   HDL 78 11/03/2023   Lab Results  Component Value Date   LDLCALC 112 (H) 11/03/2023   Lab Results  Component Value Date   TRIG 131 11/03/2023   Lab Results  Component Value Date   CHOLHDL 2.7 11/03/2023   Lab Results  Component Value Date   HGBA1C 8.7 (H) 04/22/2024      Assessment & Plan:   Problem List Items Addressed This Visit       Cardiovascular and Mediastinum   Essential hypertension   BP Readings from Last 1 Encounters:  05/10/24 112/70   Well controlled with amlodipine  and metoprolol  Counseled for compliance with the medications Advised DASH diet and moderate exercise/walking, at least 150 mins/week        Endocrine   Uncontrolled type 2 diabetes mellitus with hyperglycemia, without long-term current use of insulin  (HCC)   Lab Results  Component Value Date   HGBA1C 8.7 (H)  04/22/2024   Uncontrolled, was improving - needs follow up with Nutritionist On Lantus  20 U BID, glipizide  10 mg BID and Metformin  500 XR mg QD as she had diarrhea with regular Metformin  Increased dose of Lantus  to 26 U BID, she denies ISS Did not tolerate Jardiance , Januvia  and GLP-1 agonist in the past  She likely has mostly hyperglycemia, but due to her diet noncompliance, concern for hypoglycemia as well - she would benefit from CGM, sent freestyle libre 3 plus sensors Needs to follow diabetic diet - refer to nutritional counseling as she reports noncompliance to diabetic diet F/u CMP and lipid panel Diabetic eye exam: Advised to follow up with Ophthalmology for diabetic eye exam      Relevant Medications   insulin  glargine (LANTUS  SOLOSTAR) 100 UNIT/ML Solostar Pen   Other Relevant Orders   Urine Microalbumin w/creat. ratio     Genitourinary   AKI (acute kidney injury) (HCC)   Resolved now with IV fluids Check CMP        Other   COVID-19 virus infection - Primary   Was treated with Remdesevir and oxygen support Now feeling better, on room air Advised to take Mucinex  as needed for cough      Hospital discharge follow-up   Hospital chart reviewed, including discharge summary Medications reconciled and reviewed with the patient in detail       Meds ordered this encounter  Medications   insulin  glargine (LANTUS  SOLOSTAR) 100 UNIT/ML Solostar Pen    Sig: Inject 26 Units into the skin 2 (two) times daily.    Dispense:  36 mL    Refill:  2    Follow-up: Return in about 3 months (around 08/10/2024) for DM.    Suzzane MARLA Blanch, MD

## 2024-05-10 NOTE — Patient Instructions (Signed)
 Please start taking Lantus  26 U twice daily.  Please continue to take medications as prescribed.  Please continue to follow low carb diet and perform moderate exercise/walking as tolerated.

## 2024-05-10 NOTE — Assessment & Plan Note (Addendum)
 Was treated with Remdesevir and oxygen support Now feeling better, on room air Advised to take Mucinex  as needed for cough

## 2024-05-10 NOTE — Assessment & Plan Note (Signed)
 BP Readings from Last 1 Encounters:  05/10/24 112/70   Well controlled with amlodipine  and metoprolol  Counseled for compliance with the medications Advised DASH diet and moderate exercise/walking, at least 150 mins/week

## 2024-05-11 ENCOUNTER — Ambulatory Visit: Admitting: Internal Medicine

## 2024-05-12 LAB — MICROALBUMIN / CREATININE URINE RATIO
Creatinine, Urine: 233.9 mg/dL
Microalb/Creat Ratio: 63 mg/g{creat} — ABNORMAL HIGH (ref 0–29)
Microalbumin, Urine: 146.2 ug/mL

## 2024-05-14 ENCOUNTER — Encounter: Payer: Self-pay | Admitting: Radiology

## 2024-06-03 ENCOUNTER — Other Ambulatory Visit: Payer: Self-pay | Admitting: Internal Medicine

## 2024-06-03 DIAGNOSIS — E1165 Type 2 diabetes mellitus with hyperglycemia: Secondary | ICD-10-CM

## 2024-06-09 LAB — OPHTHALMOLOGY REPORT-SCANNED

## 2024-06-29 ENCOUNTER — Inpatient Hospital Stay

## 2024-06-30 ENCOUNTER — Inpatient Hospital Stay: Attending: Physician Assistant

## 2024-06-30 DIAGNOSIS — Z9221 Personal history of antineoplastic chemotherapy: Secondary | ICD-10-CM | POA: Insufficient documentation

## 2024-06-30 DIAGNOSIS — Z806 Family history of leukemia: Secondary | ICD-10-CM | POA: Insufficient documentation

## 2024-06-30 DIAGNOSIS — N1831 Chronic kidney disease, stage 3a: Secondary | ICD-10-CM | POA: Insufficient documentation

## 2024-06-30 DIAGNOSIS — D110 Benign neoplasm of parotid gland: Secondary | ICD-10-CM | POA: Diagnosis not present

## 2024-06-30 DIAGNOSIS — D631 Anemia in chronic kidney disease: Secondary | ICD-10-CM | POA: Insufficient documentation

## 2024-06-30 DIAGNOSIS — M8580 Other specified disorders of bone density and structure, unspecified site: Secondary | ICD-10-CM | POA: Diagnosis not present

## 2024-06-30 DIAGNOSIS — Z8616 Personal history of COVID-19: Secondary | ICD-10-CM | POA: Insufficient documentation

## 2024-06-30 DIAGNOSIS — Z8051 Family history of malignant neoplasm of kidney: Secondary | ICD-10-CM | POA: Insufficient documentation

## 2024-06-30 DIAGNOSIS — Z809 Family history of malignant neoplasm, unspecified: Secondary | ICD-10-CM | POA: Diagnosis not present

## 2024-06-30 DIAGNOSIS — Z9011 Acquired absence of right breast and nipple: Secondary | ICD-10-CM | POA: Insufficient documentation

## 2024-06-30 DIAGNOSIS — D509 Iron deficiency anemia, unspecified: Secondary | ICD-10-CM | POA: Insufficient documentation

## 2024-06-30 DIAGNOSIS — Z08 Encounter for follow-up examination after completed treatment for malignant neoplasm: Secondary | ICD-10-CM | POA: Insufficient documentation

## 2024-06-30 DIAGNOSIS — Z853 Personal history of malignant neoplasm of breast: Secondary | ICD-10-CM | POA: Insufficient documentation

## 2024-06-30 DIAGNOSIS — Z800 Family history of malignant neoplasm of digestive organs: Secondary | ICD-10-CM | POA: Insufficient documentation

## 2024-06-30 DIAGNOSIS — Z79899 Other long term (current) drug therapy: Secondary | ICD-10-CM | POA: Diagnosis not present

## 2024-06-30 DIAGNOSIS — Z923 Personal history of irradiation: Secondary | ICD-10-CM | POA: Diagnosis not present

## 2024-06-30 DIAGNOSIS — E559 Vitamin D deficiency, unspecified: Secondary | ICD-10-CM

## 2024-06-30 DIAGNOSIS — I890 Lymphedema, not elsewhere classified: Secondary | ICD-10-CM | POA: Diagnosis not present

## 2024-06-30 LAB — CBC WITH DIFFERENTIAL/PLATELET
Abs Immature Granulocytes: 0.02 K/uL (ref 0.00–0.07)
Basophils Absolute: 0.1 K/uL (ref 0.0–0.1)
Basophils Relative: 1 %
Eosinophils Absolute: 0.3 K/uL (ref 0.0–0.5)
Eosinophils Relative: 3 %
HCT: 35.8 % — ABNORMAL LOW (ref 36.0–46.0)
Hemoglobin: 11.3 g/dL — ABNORMAL LOW (ref 12.0–15.0)
Immature Granulocytes: 0 %
Lymphocytes Relative: 30 %
Lymphs Abs: 3 K/uL (ref 0.7–4.0)
MCH: 26.4 pg (ref 26.0–34.0)
MCHC: 31.6 g/dL (ref 30.0–36.0)
MCV: 83.6 fL (ref 80.0–100.0)
Monocytes Absolute: 0.8 K/uL (ref 0.1–1.0)
Monocytes Relative: 8 %
Neutro Abs: 6 K/uL (ref 1.7–7.7)
Neutrophils Relative %: 58 %
Platelets: 256 K/uL (ref 150–400)
RBC: 4.28 MIL/uL (ref 3.87–5.11)
RDW: 14.8 % (ref 11.5–15.5)
WBC: 10.2 K/uL (ref 4.0–10.5)
nRBC: 0 % (ref 0.0–0.2)

## 2024-06-30 LAB — IRON AND TIBC
Iron: 77 ug/dL (ref 28–170)
Saturation Ratios: 23 % (ref 10.4–31.8)
TIBC: 339 ug/dL (ref 250–450)
UIBC: 262 ug/dL

## 2024-06-30 LAB — FERRITIN: Ferritin: 76 ng/mL (ref 11–307)

## 2024-07-05 NOTE — Progress Notes (Unsigned)
 [facility] [address], [place] [address]   CLINIC:  Medical Oncology/Hematology  PCP:  [name], MD [address] / [place] [address] [phone]    *** RESUME PREP BELOW ***    CHIEF COMPLIANT: Follow-up for stage IA (T1cN0M0) right breast cancer, ER+/PR+ (diagnosed in 1997) + iron deficiency anemia  BRIEF ONCOLOGIC HISTORY:   Oncology History  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 (HCC)  02/09/2008 Initial Diagnosis   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 (HCC)   07/06/2014 Genetic Testing   Testing was normal and did not reveal a mutation in these genes. The genes tested were ATM, BARD1, BRCA1, BRCA2, BRIP1, CDH1, CHEK2, MRE11A, MUTYH, NBN, NF1, PALB2, PTEN, RAD50, RAD51C, RAD51D, and TP53.    CANCER STAGING:  Cancer Staging  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 [facility]) Staging form: Breast, AJCC 6th Edition - Clinical: Stage I (T1c, N0, M0) - Signed by Berry Debby RAMAN, PA-C on 05/21/2013   INTERVAL HISTORY:   [name], a 78 y.o. female, returns for routine follow-up of her history of right-sided breast cancer, which was diagnosed in 10. [name] was last seen on 02/18/2023 by Pleasant Barefoot PA-C.   At today's visit, she  reports feeling fair, but is concerned regarding the various symptoms addressed below.  She reports 50% energy and 75% appetite.  She is maintaining stable weight at this time.  LEFT SUPERIOR NECK MASS: Patient reports waxing and waning nodule of left upper neck, just posterior/inferior to her ear, present x 2 years.  She reports that this is sometimes painful and tender.  She denies any other masses or lymphadenopathy.  She is a lifelong non-smoker.  PCP ordered US  of left superior neck (11/14/2023), which showed 2 intraparotid structures, predominantly cystic, largest measuring 1.9 x 0.9 x 1.2 cm, suspicious for necrotic lymph node.  Previous CT from 11/10/2020 demonstrated 2 similarly sized masses, suggesting  chronic process such as Warthin's tumor or inflammatory lymph nodes which are now necrotic.  Further evaluation with fine-needle aspiration was recommended, and patient was instructed to follow-up with her ENT (Dr. Karis), but this has not yet been arranged.  HISTORY OF BREAST CANCER with NEW BREAST TENDERNESS: Patient has not noticed any new breast nodules or masses, but reports scattered areas of left breast tenderness x 2 months.  She also reports none drenching night sweats x 2 months, but denies any unexplained fever, chills, or weight loss.  She has some ongoing aching in her right arm with intermittent lymphedema.  She reports new headaches for the past few months, but denies any seizures or focal neurologic deficits.  She has abdominal discomfort from her hernia.   IRON DEFICIENCY ANEMIA:  She denies any frank melena or hematochezia, but does have intermittent dark blackish bowel movements that are hard and compact.   She admits to fatigue, dyspnea on exertion, and lightheadedness without syncope.  She denies any chest pain.  She had not been taking iron tablet for the past 6 months, but restarted taking it a few weeks ago.  She takes daily vitamin D , but is uncertain of the dose.   ASSESSMENT & PLAN:  1.  Stage Ia (T1cN0M0) right breast cancer, ER positive/PR positive: - Patient was diagnosed with right breast cancer in 1997. - Right breast cancer measuring 1.1 x 1.2 cm, grade 2, status post right mastectomy with regional axillary lymph node dissection on 10/17/1995; all 8 axillary lymph nodes were negative for metastatic disease. -  She had adjuvant chemotherapy consisting of Adriamycin/Cytoxan x4 cycles. - Antiestrogen therapy with tamoxifen x5 years was completed in July 2002. - Most recent LEFT breast mammogram (02/26/2023): BI-RADS Category 1, negative - Most recent labs (12/31/2023): Baseline CBC and CMP.  LFTs are normal, and she has baseline mild CKD stage IIIa with GFR 52/creatinine  1.14. - Patient reports left breast tenderness x 2 months, as per visit today 07/05/24  - Physical exam today (07/05/24) showed right mastectomy site within normal limits.  No masses or lymphadenopathy in left breast or bilateral axilla. - PLAN: We will check diagnostic mammogram of left breast due to new onset tenderness.  2.  Left superior neck mass (? necrotic lymph node) - Patient reports waxing and waning nodule of left upper neck, just posterior/inferior to her ear, present x 2 years.  She reports that this is sometimes painful and tender. - Lifelong non-smoker. - Ultrasound of left superior neck (11/14/2023, ordered per PCP): Presence of 2 intraparotid structures, predominantly cystic, largest measuring 1.9 x 0.9 x 1.2 cm, suspicious for necrotic lymph node.  Previous CT from 11/10/2020 demonstrated 2 similarly sized masses, suggesting chronic process such as Warthin's tumor or inflammatory lymph nodes which are now necrotic. - Further evaluation with fine-needle aspiration was recommended, and patient was instructed to follow-up with her ENT (Dr. Karis), but this has not yet been arranged. - PLAN: Recommend ENT follow-up as soon as possible for fine-needle aspiration.  We will send referral to help facilitate this.  3.  Iron deficiency anemia + anemia CKD - Labs from 02/11/2023 show mild iron deficiency anemia, Hgb 11.3/MCV 80, ferritin 17, iron saturation 11%. - Colonoscopy (10/29/2016): Diverticulosis, internal hemorrhoids - EGD (10/29/2016): Dysphagia due to GERD/peptic stricture, mild gastritis - Per last visit with Dr. Rogers, patient was instructed to take iron tablets 3 times weekly (since May 2023).  Patient stopped taking these about 6 months ago - She reports dark bowel movements when constipated, but denies any gross hematochezia or melena. - She reports fatigue, pica, DOE, and lightheadedness - Most recent labs (12/31/2023): Hgb 11.2/MCV 82.7, ferritin 29, iron saturation 15%.  CMP  shows baseline CKD stage IIIa - PLAN: Check stool cards x 3. - Restart oral iron every other day, along with stool softener (Rx to pharmacy) - Repeat CBC with iron panel + OFFICE visit in 6 months   4.  Osteopenia:. - DEXA scan on 02/08/2021 T score -1.7, osteopenia. - Bone density/DEXA (02/26/2023): T-score -1.5, osteopenia - Labs from 12/31/2023 show mildly low vitamin D  at 23.16, normal calcium  9.0 - She is taking daily vitamin D , but uncertain of the dose. - PLAN: Rx to pharmacy for Vitamin D  2,000 units daily.  Continue calcium  and weightbearing exercises.  - Recheck in 1 year  PLAN SUMMARY:  >> Please enter referral to ENT (previously seen by Dr. Karis) - needs fine-needle aspiration of necrotic lymph node in left upper neck >> Diagnostic mammogram and US  of left breast >> Stool cards x 3 >> Rx to pharmacy for iron tablet, stool softener, and vitamin D  >> Labs in 6 months = CBC/D, ferritin, iron/TIBC, vitamin D  >> OFFICE visit in 6 months (after labs)    REVIEW OF SYSTEMS:  Review of Systems  Constitutional:  Positive for fatigue. Negative for appetite change, chills, diaphoresis, fever and unexpected weight change.  HENT:   Negative for lump/mass and nosebleeds.   Eyes:  Negative for eye problems.  Respiratory:  Positive for chest tightness, cough and shortness  of breath. Negative for hemoptysis.   Cardiovascular:  Negative for chest pain, leg swelling and palpitations.  Gastrointestinal:  Negative for abdominal pain, blood in stool, constipation, diarrhea, nausea and vomiting.  Genitourinary:  Negative for bladder incontinence and hematuria.   Musculoskeletal:  Positive for arthralgias and myalgias.  Skin: Negative.   Neurological:  Positive for dizziness, headaches, light-headedness and numbness.  Hematological:  Does not bruise/bleed easily.  Psychiatric/Behavioral:  Positive for sleep disturbance. The patient is nervous/anxious.     PHYSICAL EXAM:   Performance status  (ECOG): 1 - Symptomatic but completely ambulatory  There were no vitals filed for this visit.  Wt Readings from Last 3 Encounters:  05/10/24 178 lb 3.2 oz (80.8 kg)  04/22/24 170 lb (77.1 kg)  02/06/24 177 lb 6.4 oz (80.5 kg)   Physical Exam Constitutional:      Appearance: Normal appearance. She is obese.  Neck:   Cardiovascular:     Rate and Rhythm: Tachycardia present.     Heart sounds: Murmur heard.  Pulmonary:     Breath sounds: Normal breath sounds.  Chest:  Breasts:    Right: Absent.     Left: Tenderness present.       Comments: S/p right mastectomy. Left breast without any nodules or masses. No supraclavicular, axillary, or pectoral lymphadenopathy bilaterally. Lymphadenopathy:     Head:     Left side of head: Posterior auricular adenopathy present.  Neurological:     General: No focal deficit present.     Mental Status: Mental status is at baseline.  Psychiatric:        Behavior: Behavior normal. Behavior is cooperative.      PAST MEDICAL/SURGICAL HISTORY:  Past Medical History:  Diagnosis Date   (HFpEF) heart failure with preserved ejection fraction (HCC)    Abdominal hernia 02/26/2012   unrepaired   Adenocarcinoma of breast (HCC) 1997   right / chemo + tamoxifen x 5 years    Anemia    Anxiety    Aortic atherosclerosis    Arthritis    Blood transfusion 1980's   Breast cancer (HCC)    Cancer (HCC)    Phreesia 08/18/2020   Complication of anesthesia    has a hard time waking up; can't lay flat   Degenerative disc disease, lumbar    pressing on L3 and L4   Diabetes mellitus (HCC) 1989   Type 2 NIDDM; cancer treatment gave me diabetes   Diabetes mellitus without complication (HCC)    [employer]reesia 08/18/2020   Diverticulosis    DJD (degenerative joint disease) of lumbar spine    Dysrhythmia    irregular   Exertional dyspnea    Gastroparesis    GERD (gastroesophageal reflux disease)    mann   Heart murmur    think I outgrew it   Hepatic  steatosis    History of kidney stones    History of stomach ulcers    Hx: UTI (urinary tract infection)    Hyperlipidemia    Hypersensitivity    in tongue   Hypertension    IBS (irritable bowel syndrome)    Insomnia    Internal hemorrhoids    Kidney cysts    RT KIDNEY   Kidney stones 2009   s/p lithotripsy   Mild CAD    Mild valvular heart disease    Nephrolithiasis 04/13/2014   Obesity    Personal history of chemotherapy    Personal history of radiation therapy    PFO (patent foramen ovale)  PONV (postoperative nausea and vomiting)    Pulmonary nodule    Shortness of breath    unable to lie flat   Ventral hernia    Past Surgical History:  Procedure Laterality Date   ABDOMINAL HYSTERECTOMY  2002   APPENDECTOMY     BACK SURGERY     Can't take any medical procedure in right arm   BREAST BIOPSY     right   BREAST SURGERY N/A    [employer]reesia 04/22/2020   CATARACT EXTRACTION W/ INTRAOCULAR LENS  IMPLANT, BILATERAL  2009-2010   CESAREAN SECTION  1973; 1978; 1981   CESAREAN SECTION N/A    [employer]reesia 04/22/2020   COLONOSCOPY WITH PROPOFOL  N/A 10/29/2016   Procedure: COLONOSCOPY WITH PROPOFOL ;  Surgeon: Margo LITTIE Haddock, MD;  [facility];  Service: Endoscopy;  Laterality: N/A;  10:00 am   CYSTOSCOPY Left 04/13/2014   Procedure: CYSTOSCOPY FLEXIBLE;  Surgeon: Morene LELON Salines, MD;  Location: [facility];  Service: Urology;  Laterality: Left;  with STENT   ESOPHAGOGASTRODUODENOSCOPY (EGD) WITH PROPOFOL  N/A 10/29/2016   Procedure: ESOPHAGOGASTRODUODENOSCOPY (EGD) WITH PROPOFOL ;  Surgeon: Margo LITTIE Haddock, MD;  Location: [facility];  Service: Endoscopy;  Laterality: N/A;   EYE SURGERY     implant and screws put in the right lower jaw  11/2008   dental surgery   kidney blockage  ?1990's    major surgery ;put kidney on pump for awhile   LITHOTRIPSY     several times   LUMBAR FUSION  08/2010   MASTECTOMY  1997   right   NEPHROLITHOTOMY Left 04/13/2014   Procedure: LEFT  PERCUTANEOUS NEPHROLITHOTOMY WITH SURGEON ACCESS;  Surgeon: Morene LELON Salines, MD;  Location: [facility];  Service: Urology;  Laterality: Left;   OVARIAN CYST SURGERY     PARATHYROIDECTOMY  02/26/2012   Procedure: PARATHYROIDECTOMY;  Surgeon: Ana LELON Moccasin, MD;  Location: [facility] OR;  Service: ENT;;   SAVORY DILATION N/A 10/29/2016   Procedure: SAVORY DILATION;  Surgeon: Margo LITTIE Haddock, MD;  Location: [facility];  Service: Endoscopy;  Laterality: N/A;   SPINE SURGERY  2011   urological surgery for blocked ureter secondary to kidney stone      SOCIAL HISTORY:  Social History   Socioeconomic History   Marital status: Married    Spouse name: Not on file   Number of children: 3   Years of education: Not on file   Highest education level: Bachelor's degree (e.g., BA, AB, BS)  Occupational History   Occupation: retired in 2011  Tobacco Use   Smoking status: Never   Smokeless tobacco: Never  Vaping Use   Vaping status: Never Used  Substance and Sexual Activity   Alcohol  use: Yes    Alcohol /week: 0.0 standard drinks of alcohol     Comment: X2 DRINKS PER YEAR   Drug use: No   Sexual activity: Not Currently    Birth control/protection: Surgical  Other Topics Concern   Not on file  Social History Narrative   Right handed   Caffeine-2 cups daily occasionally   Social Drivers of Health   Financial Resource Strain: Low Risk  (05/07/2024)   Overall Financial Resource Strain (CARDIA)    Difficulty of Paying Living Expenses: Not very hard  Food Insecurity: No Food Insecurity (05/07/2024)   Hunger Vital Sign    Worried About Running Out of Food in the Last Year: Never true    Ran Out of Food in the Last Year: Never true  Transportation  Needs: No Transportation Needs (05/07/2024)   PRAPARE - Administrator, Civil Service (Medical): No    Lack of Transportation (Non-Medical): No  Physical Activity: Inactive (05/07/2024)   Exercise Vital Sign    Days of Exercise per Week: 0 days     Minutes of Exercise per Session: Not on file  Stress: Stress Concern Present (05/07/2024)   [employer] of Occupational Health - Occupational Stress Questionnaire    Feeling of Stress: Rather much  Social Connections: Socially Integrated (05/07/2024)   Social Connection and Isolation Panel    Frequency of Communication with Friends and Family: More than three times a week    Frequency of Social Gatherings with Friends and Family: Twice a week    Attends Religious Services: More than 4 times per year    Active Member of [employer] or Organizations: Yes    Attends Engineer, structural: More than 4 times per year    Marital Status: Married  Catering manager Violence: Not At Risk (04/23/2024)   Humiliation, Afraid, Rape, and Kick questionnaire    Fear of Current or Ex-Partner: No    Emotionally Abused: No    Physically Abused: No    Sexually Abused: No    FAMILY HISTORY:  Family History  Problem Relation Age of Onset   Birth defects Mother    Heart attack Mother    Cancer Brother        Esophageal; smoker; deceased 52s   Colon cancer Brother    Diabetes Daughter    Leukemia Paternal Aunt        deceased 39s   Pancreatic cancer Paternal Aunt        deceased 105   Cancer Paternal Aunt        GI cancer; deceased 56s   Cancer Cousin        kidney cancer; deceased 59; pat first cousin; daughter of aunt w/ leukemia   Cancer Cousin        colon cancer @ 76; pat first cousin; son of aunt with GI cancer   Anesthesia problems Neg Hx     CURRENT MEDICATIONS:  Current Outpatient Medications  Medication Sig Dispense Refill   acetaminophen  (TYLENOL ) 325 MG tablet Take 325 mg by mouth every 6 (six) hours as needed for mild pain.     amLODipine  (NORVASC ) 5 MG tablet Take 1 tablet (5 mg total) by mouth daily. 90 tablet 3   Cholecalciferol (VITAMIN D ) 50 MCG (2000 UT) CAPS Take 1 capsule (2,000 Units total) by mouth daily. 90 capsule 3   CINNAMON PO Take 1 capsule by mouth. Takes  sometimes     docusate sodium  (COLACE) 50 MG capsule Take 1 capsule (50 mg total) by mouth daily as needed for mild constipation. 90 capsule 3   DULoxetine  (CYMBALTA ) 60 MG capsule Take 1 capsule (60 mg total) by mouth daily. 90 capsule 3   ferrous sulfate  325 (65 FE) MG EC tablet Take 1 tablet (325 mg total) by mouth every other day. 90 tablet 3   glipiZIDE  (GLUCOTROL ) 10 MG tablet Take 1 tablet (10 mg total) by mouth 2 (two) times daily before a meal. 180 tablet 3   insulin  glargine (LANTUS  SOLOSTAR) 100 UNIT/ML Solostar Pen Inject 26 Units into the skin 2 (two) times daily. 36 mL 2   metFORMIN  (GLUCOPHAGE -XR) 500 MG 24 hr tablet TAKE ONE TABLET BY MOUTH ONCE DAILY WITH BREAKFAST. 90 tablet 0   metoprolol  tartrate (LOPRESSOR ) 25 MG tablet Take  1 tablet (25 mg total) by mouth 2 (two) times daily. 180 tablet 1   Multiple Vitamin (MULTIVITAMIN) tablet Take 1 tablet by mouth daily.     No current facility-administered medications for this visit.    ALLERGIES:  Allergies  Allergen Reactions   Demerol [Meperidine] Other (See Comments)    Lost consciousness   Metformin  And Related Diarrhea   Statins Other (See Comments)    Myalgia    Ace Inhibitors Cough   Bentyl  [Dicyclomine  Hcl] Swelling   Invokana [Canagliflozin] Hives   Protonix  [Pantoprazole  Sodium] Swelling    LABORATORY DATA:  I have reviewed the labs as listed.     Latest Ref Rng & Units 06/30/2024    9:43 AM 04/26/2024    4:39 AM 04/25/2024    4:00 AM  CBC  WBC 4.0 - 10.5 K/uL 10.2  8.1  7.2   Hemoglobin 12.0 - 15.0 g/dL 88.6  89.3  89.8   Hematocrit 36.0 - 46.0 % 35.8  32.1  31.9   Platelets 150 - 400 K/uL 256  228  225       Latest Ref Rng & Units 04/26/2024    4:39 AM 04/25/2024    4:00 AM 04/24/2024    4:39 AM  CMP  Glucose 70 - 99 mg/dL 696  812  837   BUN 8 - 23 mg/dL 8  9  15    Creatinine 0.44 - 1.00 mg/dL 9.04  8.99  8.86   Sodium 135 - 145 mmol/L 136  137  141   Potassium 3.5 - 5.1 mmol/L 4.0  3.4  3.0   Chloride  98 - 111 mmol/L 107  106  108   CO2 22 - 32 mmol/L 22  21  22    Calcium  8.9 - 10.3 mg/dL 8.2  8.4  8.1     DIAGNOSTIC IMAGING:  I have independently reviewed the scans and discussed with the patient. No results found.   WRAP UP:  All questions were answered. The patient knows to call the clinic with any problems, questions or concerns.  Medical decision making: Moderate  Time spent on visit: I spent 25 minutes counseling the patient face to face. The total time spent in the appointment was 40 minutes and more than 50% was on counseling.  Pleasant CHRISTELLA Barefoot, PA-C  07/05/24 7:57 PM

## 2024-07-06 ENCOUNTER — Inpatient Hospital Stay: Admitting: Physician Assistant

## 2024-07-06 VITALS — BP 159/88 | HR 80 | Temp 97.2°F | Resp 18 | Ht 66.5 in | Wt 181.0 lb

## 2024-07-06 DIAGNOSIS — N1831 Chronic kidney disease, stage 3a: Secondary | ICD-10-CM

## 2024-07-06 DIAGNOSIS — K5903 Drug induced constipation: Secondary | ICD-10-CM | POA: Diagnosis not present

## 2024-07-06 DIAGNOSIS — D509 Iron deficiency anemia, unspecified: Secondary | ICD-10-CM

## 2024-07-06 DIAGNOSIS — M8580 Other specified disorders of bone density and structure, unspecified site: Secondary | ICD-10-CM

## 2024-07-06 DIAGNOSIS — Z08 Encounter for follow-up examination after completed treatment for malignant neoplasm: Secondary | ICD-10-CM | POA: Diagnosis not present

## 2024-07-06 DIAGNOSIS — E559 Vitamin D deficiency, unspecified: Secondary | ICD-10-CM | POA: Diagnosis not present

## 2024-07-06 DIAGNOSIS — C50919 Malignant neoplasm of unspecified site of unspecified female breast: Secondary | ICD-10-CM | POA: Diagnosis not present

## 2024-07-06 DIAGNOSIS — Z170 Estrogen receptor positive status [ER+]: Secondary | ICD-10-CM

## 2024-07-06 DIAGNOSIS — D631 Anemia in chronic kidney disease: Secondary | ICD-10-CM

## 2024-07-06 MED ORDER — VITAMIN D 50 MCG (2000 UT) PO CAPS: 2000.0000 [IU] | ORAL_CAPSULE | Freq: Every day | ORAL | 3 refills | Status: AC

## 2024-07-06 MED ORDER — DOCUSATE SODIUM 50 MG PO CAPS
50.0000 mg | ORAL_CAPSULE | Freq: Every day | ORAL | 3 refills | Status: AC | PRN
Start: 2024-07-06 — End: ?

## 2024-07-06 MED ORDER — FERROUS SULFATE 325 (65 FE) MG PO TBEC: 325.0000 mg | DELAYED_RELEASE_TABLET | ORAL | 3 refills | Status: AC

## 2024-07-06 NOTE — Patient Instructions (Signed)
 [facility] at [facility] **VISIT SUMMARY & IMPORTANT INSTRUCTIONS **   You were seen today by Pleasant Barefoot PA-C for your history of breast cancer.    HISTORY OF BREAST CANCER You did not have any evidence of returning breast cancer on your most recent labs, mammogram, and physical exam.  OSTEOPENIA & VITAMIN D  DEFICIENCY Continue to take your calcium  and vitamin D  supplement to improve the strength of your bones. Prescription has been sent to your pharmacy for vitamin D  2000 units daily.  IRON DEFICIENCY ANEMIA Your blood and iron levels are lower than they should be.. Take your iron supplement so that you are taking it EVERY OTHER DAY.  If your iron pill causes constipation, you can take daily stool softener. We will recheck your iron levels and see you for follow-up visit in 1 year  FOLLOW-UP APPOINTMENT: 1 year  ** Thank you for trusting me with your healthcare!  I strive to provide all of my patients with quality care at each visit.  If you receive a survey for this visit, I would be so grateful to you for taking the time to provide feedback.  Thank you in advance!  ~ Desjuan Stearns                                        Dr. Mickiel Davonna Pleasant Barefoot, PA-C     Delon Hope, NP   - - - - - - - - - - - - - - - - - -     Thank you for choosing [facility] at [facility] to provide your oncology and hematology care.  To afford each patient quality time with our provider, please arrive at least 15 minutes before your scheduled appointment time.   If you have a lab appointment with the Cancer Center please come in thru the Main Entrance and check in at the main information desk.  You need to re-schedule your appointment should you arrive 10 or more minutes late.  We strive to give you quality time with our providers, and arriving late affects you and other patients whose appointments are after yours.  Also, if you no show three  or more times for appointments you may be dismissed from the clinic at the providers discretion.     Again, thank you for choosing [facility].  Our hope is that these requests will decrease the amount of time that you wait before being seen by our physicians.       _____________________________________________________________  Should you have questions after your visit to [facility], please contact our office at [phone] and follow the prompts.  Our office hours are 8:00 a.m. and 4:30 p.m. Monday - Friday.  Please note that voicemails left after 4:00 p.m. may not be returned until the following business day.  We are closed weekends and major holidays.  You do have access to a nurse 24-7, just call the main number to the clinic [phone] and do not press any options, hold on the line and a nurse will answer the phone.    For prescription refill requests, have your pharmacy contact our office and allow 72 hours.

## 2024-07-26 ENCOUNTER — Encounter: Payer: Self-pay | Admitting: Radiology

## 2024-08-02 ENCOUNTER — Other Ambulatory Visit: Payer: Self-pay | Admitting: Internal Medicine

## 2024-08-02 DIAGNOSIS — I10 Essential (primary) hypertension: Secondary | ICD-10-CM

## 2024-08-04 ENCOUNTER — Other Ambulatory Visit: Payer: Self-pay | Admitting: Internal Medicine

## 2024-08-04 DIAGNOSIS — E1165 Type 2 diabetes mellitus with hyperglycemia: Secondary | ICD-10-CM

## 2024-08-18 ENCOUNTER — Ambulatory Visit: Admitting: Internal Medicine

## 2024-08-24 ENCOUNTER — Ambulatory Visit: Admitting: Internal Medicine

## 2024-08-24 ENCOUNTER — Encounter: Payer: Self-pay | Admitting: Internal Medicine

## 2024-08-24 VITALS — BP 130/77 | HR 87 | Ht 66.5 in | Wt 180.6 lb

## 2024-08-24 DIAGNOSIS — I10 Essential (primary) hypertension: Secondary | ICD-10-CM

## 2024-08-24 DIAGNOSIS — R0609 Other forms of dyspnea: Secondary | ICD-10-CM

## 2024-08-24 DIAGNOSIS — E1165 Type 2 diabetes mellitus with hyperglycemia: Secondary | ICD-10-CM

## 2024-08-24 DIAGNOSIS — I517 Cardiomegaly: Secondary | ICD-10-CM | POA: Insufficient documentation

## 2024-08-24 DIAGNOSIS — Z23 Encounter for immunization: Secondary | ICD-10-CM

## 2024-08-24 MED ORDER — SURE COMFORT PEN NEEDLES 32G X 4 MM MISC: 5 refills | Status: AC

## 2024-08-24 MED ORDER — LANTUS SOLOSTAR 100 UNIT/ML ~~LOC~~ SOPN: 28.0000 [IU] | PEN_INJECTOR | Freq: Two times a day (BID) | SUBCUTANEOUS | 2 refills | Status: AC

## 2024-08-24 NOTE — Patient Instructions (Addendum)
 Please schedule medicare annual wellness.  Please start taking Lantus  28 Units twice daily.  Please continue to take other medications as prescribed.  Please continue to follow low carb diet and perform moderate exercise/walking as tolerated.

## 2024-08-24 NOTE — Assessment & Plan Note (Signed)
 Likely due to LVH, concern for diastolic CHF Needs to follow-up with cardiology

## 2024-08-24 NOTE — Progress Notes (Signed)
 Established Patient Office Visit  Subjective:  Patient ID: [name], female    DOB: [birth year]  Age: 78 y.o. MRN: [id number]  CC:  Chief Complaint  Patient presents with   Diabetes    3 month f/u    Joint Swelling    Has noticed ankle swelling thinks [name] is retaining fluid    HPI [name] is a 78 y.o. female with past medical history of HTN, DM, breast ca s/p right mastectomy and chemotherapy, HLD, DDD of lumbar spine and GERD who presents for f/u of her chronic medical conditions.  Her BP was wnl today.  [name] has been taking amlodipine  and metoprolol  now.  [name] denies any headache, chest pain, or palpitations.  [name] reports exertional dyspnea for the last few weeks.  [name] also has noticed bilateral leg swelling, worse after prolonged standing.  [name] has been evaluated by cardiology in the past, and has follow-up appointment in the next week.  [name] takes Lantus  26 units BID, glipizide  10 mg BID and metformin  ER 500 mg QD for DM. Her HbA1C had improved to 8.7 in 07/25 from 9.0 in 02/25.  But her current CGM shows frequent hyperglycemia, averages around 200.  [name] denies polyuria or polydipsia. [name] did not tolerate Januvia , Jardiance  and Trulicity  in the past.  [name] saw nutritionist and had freestyle libre. [name] had glucose levels in 300s in the past, but have been better lately around 200 mostly.  [name] has a history of hypoglycemia spells as well, but does not report any episode of hypoglycemia recently.  [name] did not tolerate statin in the past and has tried taking Nexletol , sent by lipid clinic.  [name] complains of chronic fatigue and also reports having neuropathic pain in her legs and feet, which have been chronic, but is improved now. [name] takes Duloxetine  now.    Past Medical History:  Diagnosis Date   (HFpEF) heart failure with preserved ejection fraction (HCC)    Abdominal hernia 02/26/2012   unrepaired   Adenocarcinoma of breast (HCC) 1997   right / chemo + tamoxifen x 5 years     Anemia    Anxiety    Aortic atherosclerosis    Arthritis    Blood transfusion 1980's   Breast cancer (HCC)    Cancer (HCC)    [employer]reesia 08/18/2020   Complication of anesthesia    has a hard time waking up; can't lay flat   Degenerative disc disease, lumbar    pressing on L3 and L4   Diabetes mellitus (HCC) 1989   Type 2 NIDDM; cancer treatment gave me diabetes   Diabetes mellitus without complication (HCC)    [employer]reesia 08/18/2020   Diverticulosis    DJD (degenerative joint disease) of lumbar spine    Dysrhythmia    irregular   Exertional dyspnea    Gastroparesis    GERD (gastroesophageal reflux disease)    mann   Heart murmur    think I outgrew it   Hepatic steatosis    History of kidney stones    History of stomach ulcers    Hx: UTI (urinary tract infection)    Hyperlipidemia    Hypersensitivity    in tongue   Hypertension    IBS (irritable bowel syndrome)    Insomnia    Internal hemorrhoids    Kidney cysts    RT KIDNEY   Kidney stones 2009   s/p lithotripsy   Mild CAD    Mild valvular heart disease  Nephrolithiasis 04/13/2014   Obesity    Personal history of chemotherapy    Personal history of radiation therapy    PFO (patent foramen ovale)    PONV (postoperative nausea and vomiting)    Pulmonary nodule    Shortness of breath    unable to lie flat   Ventral hernia     Past Surgical History:  Procedure Laterality Date   ABDOMINAL HYSTERECTOMY  2002   APPENDECTOMY     BACK SURGERY     Can't take any medical procedure in right arm   BREAST BIOPSY     right   BREAST SURGERY N/A    [employer]reesia 04/22/2020   CATARACT EXTRACTION W/ INTRAOCULAR LENS  IMPLANT, BILATERAL  2009-2010   CESAREAN SECTION  1973; 1978; 1981   CESAREAN SECTION N/A    [employer]reesia 04/22/2020   COLONOSCOPY WITH PROPOFOL  N/A 10/29/2016   Procedure: COLONOSCOPY WITH PROPOFOL ;  Surgeon: Margo LITTIE Haddock, MD;  Location: [facility];  Service: Endoscopy;  Laterality: N/A;  10:00 am    CYSTOSCOPY Left 04/13/2014   Procedure: CYSTOSCOPY FLEXIBLE;  Surgeon: Morene LELON Salines, MD;  Location: [facility];  Service: Urology;  Laterality: Left;  with STENT   ESOPHAGOGASTRODUODENOSCOPY (EGD) WITH PROPOFOL  N/A 10/29/2016   Procedure: ESOPHAGOGASTRODUODENOSCOPY (EGD) WITH PROPOFOL ;  Surgeon: Margo LITTIE Haddock, MD;  Location: [facility];  Service: Endoscopy;  Laterality: N/A;   EYE SURGERY     implant and screws put in the right lower jaw  11/2008   dental surgery   kidney blockage  ?1990's    major surgery ;put kidney on pump for awhile   LITHOTRIPSY     several times   LUMBAR FUSION  08/2010   MASTECTOMY  1997   right   NEPHROLITHOTOMY Left 04/13/2014   Procedure: LEFT PERCUTANEOUS NEPHROLITHOTOMY WITH SURGEON ACCESS;  Surgeon: Morene LELON Salines, MD;  Location: [facility];  Service: Urology;  Laterality: Left;   OVARIAN CYST SURGERY     PARATHYROIDECTOMY  02/26/2012   Procedure: PARATHYROIDECTOMY;  Surgeon: Ana LELON Moccasin, MD;  Location: [facility];  Service: ENT;;   SAVORY DILATION N/A 10/29/2016   Procedure: SAVORY DILATION;  Surgeon: Margo LITTIE Haddock, MD;  Location: [facility];  Service: Endoscopy;  Laterality: N/A;   SPINE SURGERY  2011   urological surgery for blocked ureter secondary to kidney stone      Family History  Problem Relation Age of Onset   Birth defects Mother    Heart attack Mother    Cancer Brother        Esophageal; smoker; deceased 75s   Colon cancer Brother    Diabetes Daughter    Leukemia Paternal Aunt        deceased 76s   Pancreatic cancer Paternal Aunt        deceased 53   Cancer Paternal Aunt        GI cancer; deceased 20s   Cancer Cousin        kidney cancer; deceased 63; pat first cousin; daughter of aunt w/ leukemia   Cancer Cousin        colon cancer @ 60; pat first cousin; son of aunt with GI cancer   Anesthesia problems Neg Hx     Social History   Socioeconomic History   Marital status: Married    Spouse name: Not on file   Number of  children: 3   Years of education: Not on file   Highest education level: Bachelor's degree (  e.g., BA, AB, BS)  Occupational History   Occupation: retired in 2011  Tobacco Use   Smoking status: Never   Smokeless tobacco: Never  Vaping Use   Vaping status: Never Used  Substance and Sexual Activity   Alcohol  use: Yes    Alcohol /week: 0.0 standard drinks of alcohol     Comment: X2 DRINKS PER YEAR   Drug use: No   Sexual activity: Not Currently    Birth control/protection: Surgical  Other Topics Concern   Not on file  Social History Narrative   Right handed   Caffeine-2 cups daily occasionally   Social Drivers of Health   Financial Resource Strain: Low Risk  (08/23/2024)   Overall Financial Resource Strain (CARDIA)    Difficulty of Paying Living Expenses: Not hard at all  Food Insecurity: No Food Insecurity (08/23/2024)   Hunger Vital Sign    Worried About Running Out of Food in the Last Year: Never true    Ran Out of Food in the Last Year: Never true  Transportation Needs: No Transportation Needs (08/23/2024)   PRAPARE - Administrator, Civil Service (Medical): No    Lack of Transportation (Non-Medical): No  Physical Activity: Inactive (08/23/2024)   Exercise Vital Sign    Days of Exercise per Week: 0 days    Minutes of Exercise per Session: Not on file  Stress: No Stress Concern Present (08/23/2024)   [employer] of Occupational Health - Occupational Stress Questionnaire    Feeling of Stress: Only a little  Social Connections: Socially Integrated (08/23/2024)   Social Connection and Isolation Panel    Frequency of Communication with Friends and Family: More than three times a week    Frequency of Social Gatherings with Friends and Family: Twice a week    Attends Religious Services: More than 4 times per year    Active Member of [employer] or Organizations: Yes    Attends Engineer, Structural: More than 4 times per year    Marital Status: Married  Careers Information Officer Violence: Not At Risk (04/23/2024)   Humiliation, Afraid, Rape, and Kick questionnaire    Fear of Current or Ex-Partner: No    Emotionally Abused: No    Physically Abused: No    Sexually Abused: No    Outpatient Medications Prior to Visit  Medication Sig Dispense Refill   acetaminophen  (TYLENOL ) 325 MG tablet Take 325 mg by mouth every 6 (six) hours as needed for mild pain.     amLODipine  (NORVASC ) 5 MG tablet Take 1 tablet (5 mg total) by mouth daily. 90 tablet 3   Cholecalciferol (VITAMIN D ) 50 MCG (2000 UT) CAPS Take 1 capsule (2,000 Units total) by mouth daily. 90 capsule 3   CINNAMON PO Take 1 capsule by mouth. Takes sometimes     docusate sodium  (COLACE) 50 MG capsule Take 1 capsule (50 mg total) by mouth daily as needed for mild constipation. 90 capsule 3   DULoxetine  (CYMBALTA ) 60 MG capsule Take 1 capsule (60 mg total) by mouth daily. 90 capsule 3   ferrous sulfate  325 (65 FE) MG EC tablet Take 1 tablet (325 mg total) by mouth every other day. 90 tablet 3   glipiZIDE  (GLUCOTROL ) 10 MG tablet TAKE (1) TABLET BY MOUTH TWICE DAILY BEFORE A MEAL 180 tablet 3   metFORMIN  (GLUCOPHAGE -XR) 500 MG 24 hr tablet TAKE ONE TABLET BY MOUTH ONCE DAILY WITH BREAKFAST. 90 tablet 0   metoprolol  tartrate (LOPRESSOR ) 25 MG tablet TAKE ONE  TABLET BY MOUTH TWICE DAILY 180 tablet 1   Multiple Vitamin (MULTIVITAMIN) tablet Take 1 tablet by mouth daily.     RESTASIS 0.05 % ophthalmic emulsion 1 drop 2 (two) times daily.     insulin  glargine (LANTUS  SOLOSTAR) 100 UNIT/ML Solostar Pen Inject 26 Units into the skin 2 (two) times daily. 36 mL 2   SURE COMFORT PEN NEEDLES 32G X 4 MM MISC as directed.     No facility-administered medications prior to visit.    Allergies  Allergen Reactions   Demerol [Meperidine] Other (See Comments)    Lost consciousness   Metformin  And Related Diarrhea   Statins Other (See Comments)    Myalgia    Ace Inhibitors Cough   Bentyl  [Dicyclomine  Hcl] Swelling    Invokana [Canagliflozin] Hives   Protonix  [Pantoprazole  Sodium] Swelling    ROS Review of Systems  Constitutional:  Positive for fatigue. Negative for chills and fever.  HENT:  Negative for congestion, sinus pressure, sinus pain and sore throat.   Eyes:  Negative for pain and discharge.  Respiratory:  Positive for shortness of breath (Exertional). Negative for cough.   Cardiovascular:  Negative for chest pain and palpitations.  Gastrointestinal:  Negative for abdominal pain, diarrhea, nausea and vomiting.  Endocrine: Negative for polydipsia and polyuria.  Genitourinary:  Negative for dysuria and hematuria.  Musculoskeletal:  Positive for arthralgias and back pain. Negative for neck pain and neck stiffness.  Skin:  Negative for rash.  Neurological:  Positive for weakness (B/l LE) and numbness (B/l hands).       Numbness and tingling in b/l LE  Psychiatric/Behavioral:  Negative for agitation and behavioral problems.       Objective:    Physical Exam Vitals reviewed.  Constitutional:      General: [name] is not in acute distress.    Appearance: [name] is not diaphoretic.  HENT:     Head: Normocephalic and atraumatic.     Nose: Nose normal.     Mouth/Throat:     Mouth: Mucous membranes are moist.  Eyes:     General: No scleral icterus.    Extraocular Movements: Extraocular movements intact.  Neck:     Comments: Mass behind left TMJ, about 2.5 cm X 2 cm oval shaped, nontender Cardiovascular:     Rate and Rhythm: Normal rate and regular rhythm.     Heart sounds: Normal heart sounds. No murmur heard. Pulmonary:     Breath sounds: Normal breath sounds. No wheezing or rales.  Musculoskeletal:     Cervical back: Neck supple. No tenderness.     Right lower leg: Edema (Mild) present.     Left lower leg: Edema (Mild) present.  Skin:    General: Skin is warm.     Findings: No rash.  Neurological:     General: No focal deficit present.     Mental Status: [name] is alert and oriented to  person, place, and time.     Sensory: Sensory deficit (B/l feet) present.     Motor: Weakness (B/l LE - 4/5) present.  Psychiatric:        Mood and Affect: Mood normal.        Behavior: Behavior normal.     BP 130/77   Pulse 87   Ht 5' 6.5 (1.689 m)   Wt 180 lb 9.6 oz (81.9 kg)   SpO2 93%   BMI 28.71 kg/m  Wt Readings from Last 3 Encounters:  08/24/24 180 lb 9.6 oz (81.9 kg)  07/06/24 181 lb (82.1 kg)  05/10/24 178 lb 3.2 oz (80.8 kg)    Lab Results  Component Value Date   TSH 0.973 04/22/2024   Lab Results  Component Value Date   WBC 10.2 06/30/2024   HGB 11.3 (L) 06/30/2024   HCT 35.8 (L) 06/30/2024   MCV 83.6 06/30/2024   PLT 256 06/30/2024   Lab Results  Component Value Date   NA 136 04/26/2024   K 4.0 04/26/2024   CO2 22 04/26/2024   GLUCOSE 303 (H) 04/26/2024   BUN 8 04/26/2024   CREATININE 0.95 04/26/2024   BILITOT 1.2 04/22/2024   ALKPHOS 62 04/22/2024   AST 25 04/22/2024   ALT 19 04/22/2024   PROT 7.1 04/22/2024   ALBUMIN 3.3 (L) 04/22/2024   CALCIUM  8.2 (L) 04/26/2024   ANIONGAP 7 04/26/2024   EGFR 63 11/03/2023   GFR 55.96 (L) 02/23/2020   Lab Results  Component Value Date   CHOL 213 (H) 11/03/2023   Lab Results  Component Value Date   HDL 78 11/03/2023   Lab Results  Component Value Date   LDLCALC 112 (H) 11/03/2023   Lab Results  Component Value Date   TRIG 131 11/03/2023   Lab Results  Component Value Date   CHOLHDL 2.7 11/03/2023   Lab Results  Component Value Date   HGBA1C 8.7 (H) 04/22/2024      Assessment & Plan:   Problem List Items Addressed This Visit       Cardiovascular and Mediastinum   Essential hypertension - Primary   BP Readings from Last 1 Encounters:  08/24/24 130/77   Well controlled with amlodipine  and metoprolol  Counseled for compliance with the medications Advised DASH diet and moderate exercise/walking, at least 150 mins/week      Relevant Orders   CMP14+EGFR   LVH (left ventricular  hypertrophy)   Has exertional dyspnea and bilateral leg swelling Previous echocardiogram showed concentric LVH Needs follow-up with cardiology On metoprolol  25 mg BID currently        Endocrine   Uncontrolled type 2 diabetes mellitus with hyperglycemia, without long-term current use of insulin  (HCC)   Lab Results  Component Value Date   HGBA1C 8.7 (H) 04/22/2024   Uncontrolled, was improving - needs follow up with Nutritionist On Lantus  26 U BID, glipizide  10 mg BID and Metformin  500 XR mg QD as [name] had diarrhea with regular Metformin  Increased dose of Lantus  to 28 U BID - [name] has postmeal hyperglycemia, but [name] denies ISS Did not tolerate Jardiance , Januvia  and GLP-1 agonist in the past  [name] likely has mostly hyperglycemia, but due to her diet noncompliance, concern for hypoglycemia as well - [name] would benefit from CGM, continue to use freestyle libre 3 plus sensors Needs to follow diabetic diet - refer to nutritional counseling as [name] reports noncompliance to diabetic diet F/u CMP and lipid panel Diabetic eye exam: Advised to follow up with Ophthalmology for diabetic eye exam      Relevant Medications   Insulin  Pen Needle (SURE COMFORT PEN NEEDLES) 32G X 4 MM MISC   insulin  glargine (LANTUS  SOLOSTAR) 100 UNIT/ML Solostar Pen   Other Relevant Orders   Hemoglobin A1c     Other   Exertional dyspnea   Likely due to LVH, concern for diastolic CHF Needs to follow-up with cardiology      Other Visit Diagnoses       Encounter for immunization       Relevant Orders   Flu vaccine  HIGH DOSE PF(Fluzone Trivalent) (Completed)           Meds ordered this encounter  Medications   Insulin  Pen Needle (SURE COMFORT PEN NEEDLES) 32G X 4 MM MISC    Sig: Take as directed for injecting Lantus  twice daily.    Dispense:  100 each    Refill:  5    Okay to dispense generic alternative, preferred by insurance.   insulin  glargine (LANTUS  SOLOSTAR) 100 UNIT/ML Solostar Pen    Sig:  Inject 28 Units into the skin 2 (two) times daily.    Dispense:  36 mL    Refill:  2    Follow-up: After 3 months.   Suzzane MARLA Blanch, MD

## 2024-08-24 NOTE — Assessment & Plan Note (Signed)
 BP Readings from Last 1 Encounters:  08/24/24 130/77   Well controlled with amlodipine  and metoprolol  Counseled for compliance with the medications Advised DASH diet and moderate exercise/walking, at least 150 mins/week

## 2024-08-24 NOTE — Assessment & Plan Note (Signed)
 Has exertional dyspnea and bilateral leg swelling Previous echocardiogram showed concentric LVH Needs follow-up with cardiology On metoprolol  25 mg BID currently

## 2024-08-24 NOTE — Assessment & Plan Note (Addendum)
 Lab Results  Component Value Date   HGBA1C 8.7 (H) 04/22/2024   Uncontrolled, was improving - needs follow up with Nutritionist On Lantus  26 U BID, glipizide  10 mg BID and Metformin  500 XR mg QD as she had diarrhea with regular Metformin  Increased dose of Lantus  to 28 U BID - she has postmeal hyperglycemia, but she denies ISS Did not tolerate Jardiance , Januvia  and GLP-1 agonist in the past  She likely has mostly hyperglycemia, but due to her diet noncompliance, concern for hypoglycemia as well - she would benefit from CGM, continue to use freestyle libre 3 plus sensors Needs to follow diabetic diet - refer to nutritional counseling as she reports noncompliance to diabetic diet F/u CMP and lipid panel Diabetic eye exam: Advised to follow up with Ophthalmology for diabetic eye exam

## 2024-08-25 ENCOUNTER — Ambulatory Visit: Payer: Self-pay | Admitting: Internal Medicine

## 2024-08-25 LAB — CMP14+EGFR
ALT: 13 IU/L (ref 0–32)
AST: 20 IU/L (ref 0–40)
Albumin: 3.9 g/dL (ref 3.8–4.8)
Alkaline Phosphatase: 61 IU/L (ref 49–135)
BUN/Creatinine Ratio: 10 — ABNORMAL LOW (ref 12–28)
BUN: 13 mg/dL (ref 8–27)
Bilirubin Total: 0.4 mg/dL (ref 0.0–1.2)
CO2: 24 mmol/L (ref 20–29)
Calcium: 9.3 mg/dL (ref 8.7–10.3)
Chloride: 102 mmol/L (ref 96–106)
Creatinine, Ser: 1.34 mg/dL — ABNORMAL HIGH (ref 0.57–1.00)
Globulin, Total: 2.9 g/dL (ref 1.5–4.5)
Glucose: 116 mg/dL — ABNORMAL HIGH (ref 70–99)
Potassium: 3.8 mmol/L (ref 3.5–5.2)
Sodium: 142 mmol/L (ref 134–144)
Total Protein: 6.8 g/dL (ref 6.0–8.5)
eGFR: 41 mL/min/1.73 — ABNORMAL LOW (ref >59)

## 2024-08-25 LAB — HEMOGLOBIN A1C
Est. average glucose Bld gHb Est-mCnc: 186 mg/dL
Hgb A1c MFr Bld: 8.1 % — ABNORMAL HIGH (ref 4.8–5.6)

## 2024-09-01 NOTE — Progress Notes (Signed)
 Cardiology Office Note   Date:  09/03/2024   ID:  Nicole, Brown [birth year], MRN [id number]  PCP:  [name], MD  Cardiologist:   Vina Gull, MD   F/U of HTN    History of Present Illness: [name] is a 78 y.o. female with a history of CAD, HTN, HL, CVA, DM, hepatic steatosis, IBS  2013, 2019  Stress Myoview  were normal  2019   Echo in 2019 showed severe LVH  LVEF 55 to 60%  G1 DD.   The pt was last seen in cardiology by D DUnn in October 2023  She complained of increased SOB along with some atypical chest tightness.   SHe had had 4 falls previously  2023 Echo Severe LVH   LVEF 60 to 65% 2023  CCTA  mild nonobstructive CAD    2023 MRI   Normal LV size/function   Mild LVH  Mild AS.  Mild MR   [place] she had stopped several of her medicines are time of scans and felt better .  Felt she was overmedicated   I saw the pt in Jan 2024  Shecomplained of  dizziness and  had borderline orthostasis at that time   Recomm increased fluids     SInce seen the pt says she gets SOB doing things   ALso notes some orthopnea.  Denies CP     Still having some dizziness     Weight has gone up rapidly    Seen by Dr Tobie recently     Current Meds  Medication Sig   acetaminophen  (TYLENOL ) 325 MG tablet Take 325 mg by mouth every 6 (six) hours as needed for mild pain.   amLODipine  (NORVASC ) 5 MG tablet Take 1 tablet (5 mg total) by mouth daily.   Cholecalciferol (VITAMIN D ) 50 MCG (2000 UT) CAPS Take 1 capsule (2,000 Units total) by mouth daily.   CINNAMON PO Take 1 capsule by mouth. Takes sometimes   docusate sodium  (COLACE) 50 MG capsule Take 1 capsule (50 mg total) by mouth daily as needed for mild constipation.   DULoxetine  (CYMBALTA ) 60 MG capsule Take 1 capsule (60 mg total) by mouth daily.   ferrous sulfate  325 (65 FE) MG EC tablet Take 1 tablet (325 mg total) by mouth every other day.   glipiZIDE  (GLUCOTROL ) 10 MG tablet TAKE (1) TABLET BY MOUTH TWICE DAILY BEFORE A MEAL    insulin  glargine (LANTUS  SOLOSTAR) 100 UNIT/ML Solostar Pen Inject 28 Units into the skin 2 (two) times daily.   Insulin  Pen Needle (SURE COMFORT PEN NEEDLES) 32G X 4 MM MISC Take as directed for injecting Lantus  twice daily.   metFORMIN  (GLUCOPHAGE -XR) 500 MG 24 hr tablet TAKE ONE TABLET BY MOUTH ONCE DAILY WITH BREAKFAST.   metoprolol  tartrate (LOPRESSOR ) 25 MG tablet TAKE ONE TABLET BY MOUTH TWICE DAILY   Multiple Vitamin (MULTIVITAMIN) tablet Take 1 tablet by mouth daily.   RESTASIS 0.05 % ophthalmic emulsion 1 drop 2 (two) times daily.     Allergies:   Demerol [meperidine], Metformin  and related, Statins, Ace inhibitors, Bentyl  [dicyclomine  hcl], Invokana [canagliflozin], and Protonix  [pantoprazole  sodium]   Past Medical History:  Diagnosis Date   (HFpEF) heart failure with preserved ejection fraction (HCC)    Abdominal hernia 02/26/2012   unrepaired   Adenocarcinoma of breast (HCC) 1997   right / chemo + tamoxifen x 5 years    Anemia    Anxiety    Aortic atherosclerosis  Arthritis    Blood transfusion 1980's   Breast cancer (HCC)    Cancer (HCC)    Phreesia 08/18/2020   Complication of anesthesia    has a hard time waking up; can't lay flat   Degenerative disc disease, lumbar    pressing on L3 and L4   Diabetes mellitus (HCC) 1989   Type 2 NIDDM; cancer treatment gave me diabetes   Diabetes mellitus without complication (HCC)    [employer]reesia 08/18/2020   Diverticulosis    DJD (degenerative joint disease) of lumbar spine    Dysrhythmia    irregular   Exertional dyspnea    Gastroparesis    GERD (gastroesophageal reflux disease)    mann   Heart murmur    think I outgrew it   Hepatic steatosis    History of kidney stones    History of stomach ulcers    Hx: UTI (urinary tract infection)    Hyperlipidemia    Hypersensitivity    in tongue   Hypertension    IBS (irritable bowel syndrome)    Insomnia    Internal hemorrhoids    Kidney cysts    RT KIDNEY    Kidney stones 2009   s/p lithotripsy   Mild CAD    Mild valvular heart disease    Nephrolithiasis 04/13/2014   Obesity    Personal history of chemotherapy    Personal history of radiation therapy    PFO (patent foramen ovale)    PONV (postoperative nausea and vomiting)    Pulmonary nodule    Shortness of breath    unable to lie flat   Ventral hernia     Past Surgical History:  Procedure Laterality Date   ABDOMINAL HYSTERECTOMY  2002   APPENDECTOMY     BACK SURGERY     Can't take any medical procedure in right arm   BREAST BIOPSY     right   BREAST SURGERY N/A    [employer]reesia 04/22/2020   CATARACT EXTRACTION W/ INTRAOCULAR LENS  IMPLANT, BILATERAL  2009-2010   CESAREAN SECTION  1973; 1978; 1981   CESAREAN SECTION N/A    [employer]ia 04/22/2020   COLONOSCOPY WITH PROPOFOL  N/A 10/29/2016   Procedure: COLONOSCOPY WITH PROPOFOL ;  Surgeon: Margo LITTIE Haddock, MD;  Location: [facility];  Service: Endoscopy;  Laterality: N/A;  10:00 am   CYSTOSCOPY Left 04/13/2014   Procedure: CYSTOSCOPY FLEXIBLE;  Surgeon: Morene LELON Salines, MD;  Location: [facility];  Service: Urology;  Laterality: Left;  with STENT   ESOPHAGOGASTRODUODENOSCOPY (EGD) WITH PROPOFOL  N/A 10/29/2016   Procedure: ESOPHAGOGASTRODUODENOSCOPY (EGD) WITH PROPOFOL ;  Surgeon: Margo LITTIE Haddock, MD;  Location: [facility];  Service: Endoscopy;  Laterality: N/A;   EYE SURGERY     implant and screws put in the right lower jaw  11/2008   dental surgery   kidney blockage  ?1990's    major surgery ;put kidney on pump for awhile   LITHOTRIPSY     several times   LUMBAR FUSION  08/2010   MASTECTOMY  1997   right   NEPHROLITHOTOMY Left 04/13/2014   Procedure: LEFT PERCUTANEOUS NEPHROLITHOTOMY WITH SURGEON ACCESS;  Surgeon: Morene LELON Salines, MD;  Location: [facility];  Service: Urology;  Laterality: Left;   OVARIAN CYST SURGERY     PARATHYROIDECTOMY  02/26/2012   Procedure: PARATHYROIDECTOMY;  Surgeon: Ana LELON Moccasin, MD;  [facility];  Service:  ENT;;   SAVORY DILATION N/A 10/29/2016   Procedure: SAVORY DILATION;  Surgeon: Margo LITTIE  Fields, MD;  Location: [facility];  Service: Endoscopy;  Laterality: N/A;   SPINE SURGERY  2011   urological surgery for blocked ureter secondary to kidney stone       Social History:  The patient  reports that she has never smoked. She has never used smokeless tobacco. She reports current alcohol  use. She reports that she does not use drugs.   Family History:  The patient's family history includes Birth defects in her mother; Cancer in her brother, cousin, cousin, and paternal aunt; Colon cancer in her brother; Diabetes in her daughter; Heart attack in her mother; Leukemia in her paternal aunt; Pancreatic cancer in her paternal aunt.    ROS:  Please see the history of present illness. All other systems are reviewed and  Negative to the above problem except as noted.    PHYSICAL EXAM: VS:  BP 126/74 (BP Location: Left Arm, Patient Position: Sitting)   Pulse 76   Resp 18   Ht 5' 6.5 (1.689 m)   Wt 186 lb 6.4 oz (84.6 kg)   SpO2 98%   BMI 29.64 kg/m    GEN: Well nourished, well developed, in no acute distress  HEENT: normal  Neck: no JVD, carotid bruits,  Cardiac: RRR; no murmurs   Respiratory:  clear to auscultation bilaterally,  GI: Sl distended   Nontender   No masses  No hepatomegaly  MS: no deformity Moving all extremities   Ext  Triv LE edema    EKG:  EKG is ordered today.  SR 91 bpm  LVH with repol abnormalitiy  Lipid Panel    Component Value Date/Time   CHOL 213 (H) 11/03/2023 0837   TRIG 131 11/03/2023 0837   HDL 78 11/03/2023 0837   CHOLHDL 2.7 11/03/2023 0837   CHOLHDL 3.4 12/15/2019 1041   VLDL 27 07/03/2016 1203   LDLCALC 112 (H) 11/03/2023 0837   LDLCALC 110 (H) 12/15/2019 1041      Wt Readings from Last 3 Encounters:  09/03/24 186 lb 6.4 oz (84.6 kg)  08/24/24 180 lb 9.6 oz (81.9 kg)  07/06/24 181 lb (82.1 kg)      ASSESSMENT AND PLAN:  1  SOB.  PT with  complaints of more SOB, wt gain.  On exam, weight is up some  Overall fluid appears OK    Will check labs.  Further testing based in test results   2  CP  Pt denies     3   HL  LDL 112,  HDL 78  Trig 131   Pt has been on statins and bempedoic acid        Will recheck   5  DM  Last A1C over 8    Reviewed diet     Current medicines are reviewed at length with the patient today.  The patient does not have concerns regarding medicines.  Signed, Vina Gull, MD  09/03/2024 9:43 AM    [facility] [address] [place], [place], [place]  [address] Phone: [phone]; Fax: [phone]

## 2024-09-03 ENCOUNTER — Ambulatory Visit: Attending: Internal Medicine | Admitting: Internal Medicine

## 2024-09-03 VITALS — BP 126/74 | HR 76 | Resp 18 | Ht 66.5 in | Wt 186.4 lb

## 2024-09-03 DIAGNOSIS — R55 Syncope and collapse: Secondary | ICD-10-CM | POA: Diagnosis not present

## 2024-09-03 DIAGNOSIS — G8929 Other chronic pain: Secondary | ICD-10-CM

## 2024-09-03 DIAGNOSIS — R42 Dizziness and giddiness: Secondary | ICD-10-CM

## 2024-09-03 LAB — CBC

## 2024-09-03 NOTE — Patient Instructions (Signed)
 Medication Instructions:  Your physician recommends that you continue on your current medications as directed. Please refer to the Current Medication list given to you today.  *If you need a refill on your cardiac medications before your next appointment, please call your pharmacy*  Lab Work: CBC, CMET, BNP, ANA, Sedrate, NMR, Lpa, Rheumatoid panel today  Testing/Procedures: NONE ordered at this time of appointment   Follow-Up: At [facility], you and your health needs are our priority.  As part of our continuing mission to provide you with exceptional heart care, our providers are all part of one team.  This team includes your primary Cardiologist (physician) and Advanced Practice Providers or APPs (Physician Assistants and Nurse Practitioners) who all work together to provide you with the care you need, when you need it.  Your next appointment:    March or April 2026 Dr. Okey  We recommend signing up for the patient portal called MyChart.  Sign up information is provided on this After Visit Summary.  MyChart is used to connect with patients for Virtual Visits (Telemedicine).  Patients are able to view lab/test results, encounter notes, upcoming appointments, etc.  Non-urgent messages can be sent to your provider as well.   To learn more about what you can do with MyChart, go to [email or web address].   Other Instructions

## 2024-09-04 LAB — COMPREHENSIVE METABOLIC PANEL WITH GFR
ALT: 17 IU/L (ref 0–32)
AST: 26 IU/L (ref 0–40)
Albumin: 4 g/dL (ref 3.8–4.8)
Alkaline Phosphatase: 67 IU/L (ref 49–135)
BUN/Creatinine Ratio: 12 (ref 12–28)
BUN: 16 mg/dL (ref 8–27)
Bilirubin Total: 0.4 mg/dL (ref 0.0–1.2)
CO2: 23 mmol/L (ref 20–29)
Calcium: 9.8 mg/dL (ref 8.7–10.3)
Chloride: 102 mmol/L (ref 96–106)
Creatinine, Ser: 1.31 mg/dL — ABNORMAL HIGH (ref 0.57–1.00)
Globulin, Total: 3.2 g/dL (ref 1.5–4.5)
Glucose: 154 mg/dL — ABNORMAL HIGH (ref 70–99)
Potassium: 4.1 mmol/L (ref 3.5–5.2)
Sodium: 142 mmol/L (ref 134–144)
Total Protein: 7.2 g/dL (ref 6.0–8.5)
eGFR: 42 mL/min/1.73 — ABNORMAL LOW (ref >59)

## 2024-09-04 LAB — CBC
Hematocrit: 38 % (ref 34.0–46.6)
Hemoglobin: 11.9 g/dL (ref 11.1–15.9)
MCH: 25.8 pg — AB (ref 26.6–33.0)
MCHC: 31.3 g/dL — AB (ref 31.5–35.7)
MCV: 82 fL (ref 79–97)
Platelets: 291 x10E3/uL (ref 150–450)
RBC: 4.62 x10E6/uL (ref 3.77–5.28)
RDW: 14.3 % (ref 11.7–15.4)
WBC: 10.2 x10E3/uL (ref 3.4–10.8)

## 2024-09-04 LAB — NMR, LIPOPROFILE
Cholesterol, Total: 227 mg/dL — AB (ref 100–199)
HDL Particle Number: 33.6 umol/L (ref >=30.5)
HDL-C: 79 mg/dL (ref >39)
LDL Particle Number: 1183 nmol/L — AB (ref <1000)
LDL Size: 21.5 nm (ref >20.5)
LDL-C (NIH Calc): 123 mg/dL — AB (ref 0–99)
LP-IR Score: 26 (ref <=45)
Small LDL Particle Number: 301 nmol/L (ref <=527)
Triglycerides: 145 mg/dL (ref 0–149)

## 2024-09-04 LAB — BRAIN NATRIURETIC PEPTIDE: BNP: 65.7 pg/mL (ref 0.0–100.0)

## 2024-09-04 LAB — ANA: Anti Nuclear Antibody (ANA): POSITIVE — AB

## 2024-09-04 LAB — SEDIMENTATION RATE: Sed Rate: 49 mm/h — AB (ref 0–40)

## 2024-09-04 LAB — RHEUMATOID FACTOR: Rheumatoid fact SerPl-aCnc: 12.3 [IU]/mL (ref <14.0)

## 2024-09-06 ENCOUNTER — Ambulatory Visit: Payer: Self-pay | Admitting: Internal Medicine

## 2024-09-06 DIAGNOSIS — R0602 Shortness of breath: Secondary | ICD-10-CM

## 2024-09-06 DIAGNOSIS — E785 Hyperlipidemia, unspecified: Secondary | ICD-10-CM

## 2024-10-16 ENCOUNTER — Other Ambulatory Visit: Payer: Self-pay | Admitting: Internal Medicine

## 2024-10-16 DIAGNOSIS — E1165 Type 2 diabetes mellitus with hyperglycemia: Secondary | ICD-10-CM

## 2024-11-03 ENCOUNTER — Ambulatory Visit (HOSPITAL_COMMUNITY)

## 2024-11-23 ENCOUNTER — Ambulatory Visit: Admitting: Internal Medicine

## 2025-01-24 ENCOUNTER — Ambulatory Visit (HOSPITAL_COMMUNITY)

## 2025-07-05 ENCOUNTER — Inpatient Hospital Stay

## 2025-07-12 ENCOUNTER — Inpatient Hospital Stay: Admitting: Physician Assistant
